# Patient Record
Sex: Female | Born: 1945
Health system: Southern US, Community
[De-identification: ages and names within clinical notes are randomized; demographics above are authoritative.]

## PROBLEM LIST (undated history)

## (undated) DIAGNOSIS — I509 Heart failure, unspecified: Secondary | ICD-10-CM

## (undated) DIAGNOSIS — I351 Nonrheumatic aortic (valve) insufficiency: Secondary | ICD-10-CM

## (undated) DIAGNOSIS — I340 Nonrheumatic mitral (valve) insufficiency: Secondary | ICD-10-CM

## (undated) DIAGNOSIS — I071 Rheumatic tricuspid insufficiency: Secondary | ICD-10-CM

## (undated) DIAGNOSIS — I480 Paroxysmal atrial fibrillation: Secondary | ICD-10-CM

## (undated) DIAGNOSIS — I4892 Unspecified atrial flutter: Secondary | ICD-10-CM

## (undated) HISTORY — PX: HIP FRACTURE SURGERY: SHX118

---

## 2008-12-14 DIAGNOSIS — M3500 Sicca syndrome, unspecified: Secondary | ICD-10-CM | POA: Insufficient documentation

## 2008-12-14 DIAGNOSIS — Z79899 Other long term (current) drug therapy: Secondary | ICD-10-CM | POA: Insufficient documentation

## 2013-05-15 DIAGNOSIS — E559 Vitamin D deficiency, unspecified: Secondary | ICD-10-CM | POA: Insufficient documentation

## 2014-05-19 DIAGNOSIS — I5043 Acute on chronic combined systolic (congestive) and diastolic (congestive) heart failure: Secondary | ICD-10-CM | POA: Insufficient documentation

## 2014-05-30 DIAGNOSIS — I340 Nonrheumatic mitral (valve) insufficiency: Secondary | ICD-10-CM | POA: Insufficient documentation

## 2015-10-28 DIAGNOSIS — M0579 Rheumatoid arthritis with rheumatoid factor of multiple sites without organ or systems involvement: Secondary | ICD-10-CM | POA: Insufficient documentation

## 2019-05-05 DIAGNOSIS — I7300 Raynaud's syndrome without gangrene: Secondary | ICD-10-CM | POA: Insufficient documentation

## 2020-07-10 DIAGNOSIS — M109 Gout, unspecified: Secondary | ICD-10-CM | POA: Insufficient documentation

## 2020-07-15 ENCOUNTER — Other Ambulatory Visit (HOSPITAL_COMMUNITY): Payer: Self-pay | Admitting: *Deleted

## 2020-07-15 ENCOUNTER — Encounter (HOSPITAL_COMMUNITY): Payer: Self-pay | Admitting: *Deleted

## 2020-07-15 DIAGNOSIS — K219 Gastro-esophageal reflux disease without esophagitis: Secondary | ICD-10-CM

## 2020-07-15 DIAGNOSIS — K529 Noninfective gastroenteritis and colitis, unspecified: Secondary | ICD-10-CM

## 2020-07-15 DIAGNOSIS — M069 Rheumatoid arthritis, unspecified: Secondary | ICD-10-CM

## 2020-07-15 DIAGNOSIS — M810 Age-related osteoporosis without current pathological fracture: Secondary | ICD-10-CM

## 2020-07-15 DIAGNOSIS — M1990 Unspecified osteoarthritis, unspecified site: Secondary | ICD-10-CM

## 2020-07-15 DIAGNOSIS — I10 Essential (primary) hypertension: Secondary | ICD-10-CM

## 2020-07-15 DIAGNOSIS — I5020 Unspecified systolic (congestive) heart failure: Secondary | ICD-10-CM

## 2020-07-15 DIAGNOSIS — I428 Other cardiomyopathies: Secondary | ICD-10-CM | POA: Insufficient documentation

## 2020-07-15 DIAGNOSIS — R002 Palpitations: Secondary | ICD-10-CM

## 2020-07-15 DIAGNOSIS — I420 Dilated cardiomyopathy: Secondary | ICD-10-CM

## 2020-07-15 HISTORY — DX: Unspecified osteoarthritis, unspecified site: M19.90

## 2020-07-15 HISTORY — DX: Gastro-esophageal reflux disease without esophagitis: K21.9

## 2020-07-15 HISTORY — DX: Dilated cardiomyopathy: I42.0

## 2020-07-15 HISTORY — DX: Age-related osteoporosis without current pathological fracture: M81.0

## 2020-07-15 HISTORY — DX: Rheumatoid arthritis, unspecified: M06.9

## 2020-07-15 HISTORY — DX: Unspecified systolic (congestive) heart failure: I50.20

## 2020-07-15 HISTORY — DX: Noninfective gastroenteritis and colitis, unspecified: K52.9

## 2020-07-15 HISTORY — DX: Essential (primary) hypertension: I10

## 2020-07-15 HISTORY — DX: Palpitations: R00.2

## 2020-08-08 ENCOUNTER — Ambulatory Visit (HOSPITAL_BASED_OUTPATIENT_CLINIC_OR_DEPARTMENT_OTHER)
Admission: RE | Admit: 2020-08-08 | Discharge: 2020-08-08 | Disposition: A | Discharge status: Home / Self Care | Payer: Medicare PPO | Source: Ambulatory Visit | Attending: Cardiology | Admitting: Cardiology

## 2020-08-08 ENCOUNTER — Other Ambulatory Visit (HOSPITAL_COMMUNITY): Payer: Self-pay

## 2020-08-08 ENCOUNTER — Other Ambulatory Visit: Payer: Self-pay

## 2020-08-08 ENCOUNTER — Encounter (HOSPITAL_COMMUNITY): Payer: Self-pay | Admitting: Cardiology

## 2020-08-08 ENCOUNTER — Ambulatory Visit (HOSPITAL_COMMUNITY)
Admission: RE | Admit: 2020-08-08 | Discharge: 2020-08-08 | Disposition: A | Discharge status: Home / Self Care | Payer: Medicare PPO | Source: Ambulatory Visit | Attending: Cardiology | Admitting: Cardiology

## 2020-08-08 VITALS — BP 90/50 | HR 82 | Wt 107.0 lb

## 2020-08-08 DIAGNOSIS — I5022 Chronic systolic (congestive) heart failure: Secondary | ICD-10-CM | POA: Diagnosis not present

## 2020-08-08 DIAGNOSIS — Z8249 Family history of ischemic heart disease and other diseases of the circulatory system: Secondary | ICD-10-CM | POA: Diagnosis not present

## 2020-08-08 DIAGNOSIS — I5020 Unspecified systolic (congestive) heart failure: Secondary | ICD-10-CM | POA: Insufficient documentation

## 2020-08-08 DIAGNOSIS — Z7982 Long term (current) use of aspirin: Secondary | ICD-10-CM | POA: Diagnosis not present

## 2020-08-08 DIAGNOSIS — Z7952 Long term (current) use of systemic steroids: Secondary | ICD-10-CM | POA: Insufficient documentation

## 2020-08-08 DIAGNOSIS — M069 Rheumatoid arthritis, unspecified: Secondary | ICD-10-CM | POA: Diagnosis not present

## 2020-08-08 DIAGNOSIS — N183 Chronic kidney disease, stage 3 unspecified: Secondary | ICD-10-CM | POA: Insufficient documentation

## 2020-08-08 DIAGNOSIS — I428 Other cardiomyopathies: Secondary | ICD-10-CM | POA: Diagnosis not present

## 2020-08-08 DIAGNOSIS — I130 Hypertensive heart and chronic kidney disease with heart failure and stage 1 through stage 4 chronic kidney disease, or unspecified chronic kidney disease: Secondary | ICD-10-CM | POA: Insufficient documentation

## 2020-08-08 HISTORY — DX: Heart failure, unspecified: I50.9

## 2020-08-08 LAB — BASIC METABOLIC PANEL
Anion gap: 15 (ref 5–15)
BUN: 40 mg/dL — ABNORMAL HIGH (ref 8–23)
CO2: 22 mmol/L (ref 22–32)
Calcium: 9.2 mg/dL (ref 8.9–10.3)
Chloride: 102 mmol/L (ref 98–111)
Creatinine, Ser: 1.69 mg/dL — ABNORMAL HIGH (ref 0.44–1.00)
GFR, Estimated: 31 mL/min — ABNORMAL LOW (ref >60)
Glucose, Bld: 89 mg/dL (ref 70–99)
Potassium: 3.7 mmol/L (ref 3.5–5.1)
Sodium: 139 mmol/L (ref 135–145)

## 2020-08-08 LAB — ECHOCARDIOGRAM COMPLETE
Area-P 1/2: 4.6 cm2
Calc EF: 15.8 %
MV M vel: 4.07 m/s
MV Peak grad: 66.3 mmHg
P 1/2 time: 356 msec
Radius: 0.3 cm
S' Lateral: 5.5 cm
Single Plane A2C EF: 20.9 %
Single Plane A4C EF: 5.2 %
Weight: 1712 oz

## 2020-08-08 LAB — CBC
HCT: 37.4 % (ref 36.0–46.0)
Hemoglobin: 11.9 g/dL — ABNORMAL LOW (ref 12.0–15.0)
MCH: 26.9 pg (ref 26.0–34.0)
MCHC: 31.8 g/dL (ref 30.0–36.0)
MCV: 84.4 fL (ref 80.0–100.0)
Platelets: 258 10*3/uL (ref 150–400)
RBC: 4.43 MIL/uL (ref 3.87–5.11)
RDW: 19 % — ABNORMAL HIGH (ref 11.5–15.5)
WBC: 5.8 10*3/uL (ref 4.0–10.5)
nRBC: 0 % (ref 0.0–0.2)

## 2020-08-08 LAB — MAGNESIUM: Magnesium: 2.4 mg/dL (ref 1.7–2.4)

## 2020-08-08 LAB — BRAIN NATRIURETIC PEPTIDE: B Natriuretic Peptide: 2904 pg/mL — ABNORMAL HIGH (ref 0.0–100.0)

## 2020-08-08 MED ORDER — CHOLESTYRAMINE 4 G PO PACK: 4.0000 g | PACK | Freq: Every day | ORAL | 12 refills | Status: DC

## 2020-08-08 MED ORDER — FOLIC ACID 1 MG PO TABS: 1.0000 mg | ORAL_TABLET | Freq: Every day | ORAL | 3 refills | Status: DC

## 2020-08-08 MED ORDER — RESTASIS 0.05 % OP EMUL: 1.0000 [drp] | Freq: Two times a day (BID) | OPHTHALMIC | 3 refills | Status: DC

## 2020-08-08 MED ORDER — AZATHIOPRINE 50 MG PO TABS: 50.0000 mg | ORAL_TABLET | Freq: Every day | ORAL | 3 refills | Status: DC

## 2020-08-08 MED ORDER — PREDNISONE 5 MG PO TABS: 5.0000 mg | ORAL_TABLET | Freq: Every day | ORAL | 3 refills | Status: DC

## 2020-08-08 MED ORDER — ERGOCALCIFEROL 1.25 MG (50000 UT) PO CAPS: 50000.0000 [IU] | ORAL_CAPSULE | ORAL | 3 refills | Status: DC

## 2020-08-08 MED ORDER — HYDROXYCHLOROQUINE SULFATE 200 MG PO TABS: 200.0000 mg | ORAL_TABLET | Freq: Every day | ORAL | 3 refills | Status: DC

## 2020-08-08 MED ORDER — BUMETANIDE 1 MG PO TABS: 3.0000 mg | ORAL_TABLET | Freq: Two times a day (BID) | ORAL | 3 refills | Status: DC

## 2020-08-08 MED ORDER — OMEPRAZOLE 20 MG PO CPDR: 20.0000 mg | DELAYED_RELEASE_CAPSULE | Freq: Every day | ORAL | 11 refills | Status: DC

## 2020-08-08 MED ORDER — METOPROLOL SUCCINATE ER 50 MG PO TB24: 75.0000 mg | ORAL_TABLET | Freq: Every day | ORAL | 3 refills | Status: DC

## 2020-08-08 MED ORDER — ASPIRIN EC 81 MG PO TBEC: 81.0000 mg | DELAYED_RELEASE_TABLET | Freq: Every day | ORAL | 3 refills | Status: DC

## 2020-08-08 NOTE — Progress Notes (Signed)
Patient ambulated in hall on room air oxygen saturation ranged from 100% to 86% heart rate ranged from 89-93.

## 2020-08-08 NOTE — Progress Notes (Signed)
ReDS Vest / Clip - 08/08/20 1100      ReDS Vest / Clip   Station Marker A    Ruler Value 23    ReDS Value Range Low volume    ReDS Actual Value 20

## 2020-08-08 NOTE — Patient Instructions (Addendum)
START Verquvo 2.5mg  (1 tablet) Daily  You have been referred to Dr. Melissa Noon office for Rheumatology. They will contact you to schedule an appointment  You have been referred to Nephrology. They will contact you to schedule an appointment  Labs done today, your results will be available in MyChart, we will contact you for abnormal readings.  Your physician has requested that you have an echocardiogram. Echocardiography is a painless test that uses sound waves to create images of your heart. It provides your doctor with information about the size and shape of your heart and how well your heart's chambers and valves are working. This procedure takes approximately one hour. There are no restrictions for this procedure. THIS WILL HAPPEN AFTER YOUR APPOINTMENT TODAY  Your physician recommends that you schedule a follow-up appointment in: 2 weeks   If you have any questions or concerns before your next appointment please send [place] a message through mychart or call our office at [phone].    TO LEAVE A MESSAGE FOR THE NURSE SELECT OPTION 2, PLEASE LEAVE A MESSAGE INCLUDING: . YOUR NAME . DATE OF BIRTH . CALL BACK NUMBER . REASON FOR CALL**this is important as we prioritize the call backs  [place] AS LONG AS YOU CALL BEFORE 4:00 PM    HEART CATHETERIZATION INSTRUCTIONS:   You are scheduled for a Cardiac Catheterization on Wednesday, January 5 with Dr. Loralie Champagne.  1. Please arrive at the [facility] (Main Entrance A) at [facility]: [address] [place], [place] [address] at 6:30 AM (This time is two hours before your procedure to ensure your preparation). Free valet parking service is available.   Special note: Every effort is made to have your procedure done on time. Please understand that emergencies sometimes delay scheduled procedures.  2. Diet: Do not eat solid foods after midnight.  The patient may have clear liquids until 5am upon the  day of the procedure.  3. COVID TEST: 1/3 at [place], [place] [address]  4. Medication instructions in preparation for your procedure:  On the morning of your procedure, take your Aspirin and any morning medicines.  You may use sips of water.  5. Plan for one night stay--bring personal belongings. 6. Bring a current list of your medications and current insurance cards. 7. You MUST have a responsible person to drive you home. 8. Someone MUST be with you the first 24 hours after you arrive home or your discharge will be delayed. 9. Please wear clothes that are easy to get on and off and wear slip-on shoes.  Thank you for allowing [place] to care for you!   -- [facility]

## 2020-08-08 NOTE — Progress Notes (Signed)
Cardiology: Dr. Aundra Dubin  74 y.o. with history of rheumatoid arthritis, CKD stage 3, and nonischemic cardiomyopathy was referred by Dr. Andrey Cota in [place] to establish heart failure care in [place].  Patient has been known to have a cardiomyopathy since 2015.  Cardiac MRI in 2015 showed EF 37% with no LGE.  Coronary angiography at that time showed no significant coronary disease.  Over the next few years, LV EF fluctuated up and down.  In 3/21, echo showed EF down to 20-25%.  She was admitted to the hospital in [place], [place] in 11/21 with CHF exacerbation.  Echo showed EF 20-25%, global hypokinesis, mildly decreased RV systolic function.  Creatinine was noted to be elevated. She was diuresed and sent home, but was readmitted later in 11/21 with ongoing volume overload. Creatinine was up to 2.89.  She was taken off most of her cardiac meds due to soft BP and elevated creatinine. She was discharged to her daughter's house in [place].   Patient presents today with her children.  She has poor appetite/early satiety.  She is fatigued/short of breath after walking about 30-40 feet though symptoms tend to wax and wane.  Oxygen saturation is fine at rest but dropped to 86% today with ambulation.  She has RA but reports minimal joint pain currently.  She is on prednisone 5 mg daily chronically.  She has chronic 3 pillow orthopnea.   Of note, it sounds like her mother also had a nonischemic cardiomyopathy.   ECG (personally reviewed): NSR, LAFB, poor RWP.   Labs (12/21): K 5, creatinine 2.89 => 1.9  PMH: 1. GERD 2. Rheumatoid arthritis: Followed by rheumatology in [place].  - No known pulmonary disease from RA.  3. Osteoporosis. 4. Gout 5. CKD stage 3 6. H/o SVT 7. Chronic systolic CHF: She was found to have nonischemic cardiomyopathy with low EF in 2015.  - LHC (2015): Normal coronaries.  - cardiac MRI (2015): EF 37%, no LGE - Echo (3/21): EF 20-25%. - Echo (11/21): EF 20-25%, moderate LV  dilation, global hypokinesis, biatrial enlargement, mildly decreased RV systolic function.   SH: From [place] but recently moved to [place] to live with daughter.  Has 3 children.  Nonsmoker.  No ETOH.   FH: Mother with CHF, father with MI.   ROS: All systems reviewed and negative except as per HPI.   Current Outpatient Medications  Medication Sig Dispense Refill  . aspirin EC 81 MG tablet Take 1 tablet (81 mg total) by mouth daily. Swallow whole. 90 tablet 3  . azaTHIOprine (IMURAN) 50 MG tablet Take 1 tablet (50 mg total) by mouth daily. 30 tablet 3  . bumetanide (BUMEX) 1 MG tablet Take 3 tablets (3 mg total) by mouth 2 (two) times daily. 90 tablet 3  . cholestyramine (QUESTRAN) 4 g packet Take 1 packet (4 g total) by mouth daily. 60 each 12  . cycloSPORINE (RESTASIS) 0.05 % ophthalmic emulsion Place 1 drop into both eyes 2 (two) times daily. 0.4 mL 3  . ergocalciferol (VITAMIN D2) 1.25 MG (50000 UT) capsule Take 1 capsule (50,000 Units total) by mouth once a week. 10 capsule 3  . folic acid (FOLVITE) 1 MG tablet Take 1 tablet (1 mg total) by mouth daily. 30 tablet 3  . hydroxychloroquine (PLAQUENIL) 200 MG tablet Take 1 tablet (200 mg total) by mouth daily. 30 tablet 3  . metoprolol succinate (TOPROL XL) 50 MG 24 hr tablet Take 1.5 tablets (75 mg total) by mouth daily. Take with or immediately  following a meal. 45 tablet 3  . omeprazole (PRILOSEC) 20 MG capsule Take 1 capsule (20 mg total) by mouth daily. 30 capsule 11  . predniSONE (DELTASONE) 5 MG tablet Take 1 tablet (5 mg total) by mouth daily with breakfast. 30 tablet 3   No current facility-administered medications for this encounter.   BP (!) 90/50   Pulse 82   Wt 48.5 kg (107 lb)   SpO2 99%  General: NAD, thin Neck: No JVD, no thyromegaly or thyroid nodule.  Lungs: Clear to auscultation bilaterally with normal respiratory effort. CV: Nondisplaced PMI.  Heart regular S1/S2, no S3/S4, 1/6 HSM LLSB/apex.  No peripheral  edema.  No carotid bruit.  Normal pedal pulses.  Abdomen: Soft, nontender, no hepatosplenomegaly, no distention.  Skin: Intact without lesions or rashes.  Neurologic: Alert and oriented x 3.  Psych: Normal affect. Extremities: No clubbing or cyanosis.  HEENT: Normal.   Assessment/Plan: 1. Chronic systolic CHF: Nonischemic cardiomyopathy.  This has been known since 2015, cath in 2015 showed no coronary disease and cardiac MRI in 2015 showed no LGE.  Cause is uncertain, familial cardiomyopathy is a concern given nonischemic cardiomyopathy in her mother.  Cannot rule out remote viral myocarditis. She seems quite limited, NYHA class IIIb.  Poor appetite and weight loss is concerning, as is elevated creatinine (suspect cardiorenal syndrome).  Low BP and elevated creatinine have limited her cardiac med regimen. I am worried about low output/advanced heart failure here.  I am concerned that she could be too far advanced for LVAD, especially with elevated creatinine.  She is not volume overloaded by exam or REDS clip.  - Continue Bumex 3 mg bid.  - Continue Toprol XL 75 mg daily.  - With low BP and soft BP today, not many options for medication titration.  Given recent hospitalizations, I think she would be a good candidate for Verquvo 2.5 mg daily.  Will start this today.  - I will arrange for echo here.  - I will try to get her oxygen for use with exertion.  - I mentioned [employer] gene testing for possible familial cardiomyopathy. She will think about this.  - Given class IIIb symptoms and concern for advanced HF, I will arrange for RHC.  If she has low output, will need to consider milrinone initiation in an attempt to improve her creatinine and to see if she would be an LVAD candidate.  2. CKD: Stage 3.   - BMET today.  - Refer to nephrology.  3. Rheumatoid arthritis: No history of lung involvement. She has been on a low dose of prednisone chronically.  - Refer to Dr. Amil Amen to establish care for  RA.   Loralie Champagne 08/08/2020

## 2020-08-08 NOTE — Progress Notes (Signed)
  Echocardiogram 2D Echocardiogram has been performed.  Tracy Nelson 08/08/2020, 12:38 PM

## 2020-08-08 NOTE — H&P (View-Only) (Signed)
Cardiology: Dr. Aundra Dubin  74 y.o. with history of rheumatoid arthritis, CKD stage 3, and nonischemic cardiomyopathy was referred by Dr. Andrey Cota in [place] to establish heart failure care in [place].  Patient has been known to have a cardiomyopathy since 2015.  Cardiac MRI in 2015 showed EF 37% with no LGE.  Coronary angiography at that time showed no significant coronary disease.  Over the next few years, LV EF fluctuated up and down.  In 3/21, echo showed EF down to 20-25%.  She was admitted to the hospital in [place], [place] in 11/21 with CHF exacerbation.  Echo showed EF 20-25%, global hypokinesis, mildly decreased RV systolic function.  Creatinine was noted to be elevated. She was diuresed and sent home, but was readmitted later in 11/21 with ongoing volume overload. Creatinine was up to 2.89.  She was taken off most of her cardiac meds due to soft BP and elevated creatinine. She was discharged to her daughter's house in [place].   Patient presents today with her children.  She has poor appetite/early satiety.  She is fatigued/short of breath after walking about 30-40 feet though symptoms tend to wax and wane.  Oxygen saturation is fine at rest but dropped to 86% today with ambulation.  She has RA but reports minimal joint pain currently.  She is on prednisone 5 mg daily chronically.  She has chronic 3 pillow orthopnea.   Of note, it sounds like her mother also had a nonischemic cardiomyopathy.   ECG (personally reviewed): NSR, LAFB, poor RWP.   Labs (12/21): K 5, creatinine 2.89 => 1.9  PMH: 1. GERD 2. Rheumatoid arthritis: Followed by rheumatology in [place].  - No known pulmonary disease from RA.  3. Osteoporosis. 4. Gout 5. CKD stage 3 6. H/o SVT 7. Chronic systolic CHF: She was found to have nonischemic cardiomyopathy with low EF in 2015.  - LHC (2015): Normal coronaries.  - cardiac MRI (2015): EF 37%, no LGE - Echo (3/21): EF 20-25%. - Echo (11/21): EF 20-25%, moderate LV  dilation, global hypokinesis, biatrial enlargement, mildly decreased RV systolic function.   SH: From [place] but recently moved to [place] to live with daughter.  Has 3 children.  Nonsmoker.  No ETOH.   FH: Mother with CHF, father with MI.   ROS: All systems reviewed and negative except as per HPI.   Current Outpatient Medications  Medication Sig Dispense Refill  . aspirin EC 81 MG tablet Take 1 tablet (81 mg total) by mouth daily. Swallow whole. 90 tablet 3  . azaTHIOprine (IMURAN) 50 MG tablet Take 1 tablet (50 mg total) by mouth daily. 30 tablet 3  . bumetanide (BUMEX) 1 MG tablet Take 3 tablets (3 mg total) by mouth 2 (two) times daily. 90 tablet 3  . cholestyramine (QUESTRAN) 4 g packet Take 1 packet (4 g total) by mouth daily. 60 each 12  . cycloSPORINE (RESTASIS) 0.05 % ophthalmic emulsion Place 1 drop into both eyes 2 (two) times daily. 0.4 mL 3  . ergocalciferol (VITAMIN D2) 1.25 MG (50000 UT) capsule Take 1 capsule (50,000 Units total) by mouth once a week. 10 capsule 3  . folic acid (FOLVITE) 1 MG tablet Take 1 tablet (1 mg total) by mouth daily. 30 tablet 3  . hydroxychloroquine (PLAQUENIL) 200 MG tablet Take 1 tablet (200 mg total) by mouth daily. 30 tablet 3  . metoprolol succinate (TOPROL XL) 50 MG 24 hr tablet Take 1.5 tablets (75 mg total) by mouth daily. Take with or immediately  following a meal. 45 tablet 3  . omeprazole (PRILOSEC) 20 MG capsule Take 1 capsule (20 mg total) by mouth daily. 30 capsule 11  . predniSONE (DELTASONE) 5 MG tablet Take 1 tablet (5 mg total) by mouth daily with breakfast. 30 tablet 3   No current facility-administered medications for this encounter.   BP (!) 90/50   Pulse 82   Wt 48.5 kg (107 lb)   SpO2 99%  General: NAD, thin Neck: No JVD, no thyromegaly or thyroid nodule.  Lungs: Clear to auscultation bilaterally with normal respiratory effort. CV: Nondisplaced PMI.  Heart regular S1/S2, no S3/S4, 1/6 HSM LLSB/apex.  No peripheral  edema.  No carotid bruit.  Normal pedal pulses.  Abdomen: Soft, nontender, no hepatosplenomegaly, no distention.  Skin: Intact without lesions or rashes.  Neurologic: Alert and oriented x 3.  Psych: Normal affect. Extremities: No clubbing or cyanosis.  HEENT: Normal.   Assessment/Plan: 1. Chronic systolic CHF: Nonischemic cardiomyopathy.  This has been known since 2015, cath in 2015 showed no coronary disease and cardiac MRI in 2015 showed no LGE.  Cause is uncertain, familial cardiomyopathy is a concern given nonischemic cardiomyopathy in her mother.  Cannot rule out remote viral myocarditis. She seems quite limited, NYHA class IIIb.  Poor appetite and weight loss is concerning, as is elevated creatinine (suspect cardiorenal syndrome).  Low BP and elevated creatinine have limited her cardiac med regimen. I am worried about low output/advanced heart failure here.  I am concerned that she could be too far advanced for LVAD, especially with elevated creatinine.  She is not volume overloaded by exam or REDS clip.  - Continue Bumex 3 mg bid.  - Continue Toprol XL 75 mg daily.  - With low BP and soft BP today, not many options for medication titration.  Given recent hospitalizations, I think she would be a good candidate for Verquvo 2.5 mg daily.  Will start this today.  - I will arrange for echo here.  - I will try to get her oxygen for use with exertion.  - I mentioned [employer] gene testing for possible familial cardiomyopathy. She will think about this.  - Given class IIIb symptoms and concern for advanced HF, I will arrange for RHC.  If she has low output, will need to consider milrinone initiation in an attempt to improve her creatinine and to see if she would be an LVAD candidate.  2. CKD: Stage 3.   - BMET today.  - Refer to nephrology.  3. Rheumatoid arthritis: No history of lung involvement. She has been on a low dose of prednisone chronically.  - Refer to Dr. Amil Amen to establish care for  RA.   Loralie Champagne 08/08/2020

## 2020-08-12 ENCOUNTER — Other Ambulatory Visit (HOSPITAL_COMMUNITY)
Admission: RE | Admit: 2020-08-12 | Discharge: 2020-08-12 | Disposition: A | Discharge status: Home / Self Care | Payer: Medicare PPO | Source: Ambulatory Visit | Attending: Cardiology | Admitting: Cardiology

## 2020-08-12 ENCOUNTER — Other Ambulatory Visit (HOSPITAL_COMMUNITY): Payer: Self-pay | Admitting: *Deleted

## 2020-08-12 DIAGNOSIS — Z20822 Contact with and (suspected) exposure to covid-19: Secondary | ICD-10-CM | POA: Insufficient documentation

## 2020-08-12 DIAGNOSIS — Z01812 Encounter for preprocedural laboratory examination: Secondary | ICD-10-CM | POA: Diagnosis present

## 2020-08-12 LAB — SARS CORONAVIRUS 2 (TAT 6-24 HRS): SARS Coronavirus 2: NEGATIVE

## 2020-08-12 MED ORDER — VERQUVO 2.5 MG PO TABS: 2.5000 mg | ORAL_TABLET | Freq: Every day | ORAL | 3 refills | Status: DC

## 2020-08-13 ENCOUNTER — Telehealth (HOSPITAL_COMMUNITY): Payer: Self-pay | Admitting: Pharmacy Technician

## 2020-08-13 NOTE — Telephone Encounter (Signed)
Patient Advocate Encounter   Received notification from [facility] that prior authorization for Tracy Nelson is required.   PA submitted on CoverMyMeds Key [id number] Status is pending   Will continue to follow.

## 2020-08-14 ENCOUNTER — Encounter (HOSPITAL_COMMUNITY)
Admission: RE | Disposition: A | Discharge status: Home / Self Care | Payer: Medicare PPO | Source: Home / Self Care | Attending: Cardiology

## 2020-08-14 ENCOUNTER — Ambulatory Visit (HOSPITAL_COMMUNITY)
Admission: RE | Admit: 2020-08-14 | Discharge: 2020-08-14 | Disposition: A | Discharge status: Home / Self Care | Payer: Medicare PPO | Attending: Cardiology | Admitting: Cardiology

## 2020-08-14 ENCOUNTER — Telehealth (HOSPITAL_COMMUNITY): Payer: Self-pay

## 2020-08-14 ENCOUNTER — Encounter (HOSPITAL_COMMUNITY): Payer: Self-pay | Admitting: Cardiology

## 2020-08-14 DIAGNOSIS — Z79899 Other long term (current) drug therapy: Secondary | ICD-10-CM | POA: Diagnosis not present

## 2020-08-14 DIAGNOSIS — Z7982 Long term (current) use of aspirin: Secondary | ICD-10-CM | POA: Diagnosis not present

## 2020-08-14 DIAGNOSIS — Z8249 Family history of ischemic heart disease and other diseases of the circulatory system: Secondary | ICD-10-CM | POA: Diagnosis not present

## 2020-08-14 DIAGNOSIS — I428 Other cardiomyopathies: Secondary | ICD-10-CM | POA: Insufficient documentation

## 2020-08-14 DIAGNOSIS — M069 Rheumatoid arthritis, unspecified: Secondary | ICD-10-CM | POA: Diagnosis not present

## 2020-08-14 DIAGNOSIS — I5022 Chronic systolic (congestive) heart failure: Secondary | ICD-10-CM | POA: Insufficient documentation

## 2020-08-14 DIAGNOSIS — Z7952 Long term (current) use of systemic steroids: Secondary | ICD-10-CM | POA: Diagnosis not present

## 2020-08-14 DIAGNOSIS — I5020 Unspecified systolic (congestive) heart failure: Secondary | ICD-10-CM

## 2020-08-14 DIAGNOSIS — N183 Chronic kidney disease, stage 3 unspecified: Secondary | ICD-10-CM | POA: Insufficient documentation

## 2020-08-14 DIAGNOSIS — I2721 Secondary pulmonary arterial hypertension: Secondary | ICD-10-CM | POA: Insufficient documentation

## 2020-08-14 HISTORY — PX: RIGHT HEART CATH: CATH118263

## 2020-08-14 LAB — POCT I-STAT EG7
Acid-Base Excess: 0 mmol/L (ref 0.0–2.0)
Acid-Base Excess: 0 mmol/L (ref 0.0–2.0)
Bicarbonate: 25.4 mmol/L (ref 20.0–28.0)
Bicarbonate: 25.5 mmol/L (ref 20.0–28.0)
Calcium, Ion: 1.28 mmol/L (ref 1.15–1.40)
Calcium, Ion: 1.28 mmol/L (ref 1.15–1.40)
HCT: 33 % — ABNORMAL LOW (ref 36.0–46.0)
HCT: 33 % — ABNORMAL LOW (ref 36.0–46.0)
Hemoglobin: 11.2 g/dL — ABNORMAL LOW (ref 12.0–15.0)
Hemoglobin: 11.2 g/dL — ABNORMAL LOW (ref 12.0–15.0)
O2 Saturation: 61 %
O2 Saturation: 62 %
Potassium: 3.3 mmol/L — ABNORMAL LOW (ref 3.5–5.1)
Potassium: 3.3 mmol/L — ABNORMAL LOW (ref 3.5–5.1)
Sodium: 138 mmol/L (ref 135–145)
Sodium: 139 mmol/L (ref 135–145)
TCO2: 27 mmol/L (ref 22–32)
TCO2: 27 mmol/L (ref 22–32)
pCO2, Ven: 43.7 mmHg — ABNORMAL LOW (ref 44.0–60.0)
pCO2, Ven: 44.2 mmHg (ref 44.0–60.0)
pH, Ven: 7.369 (ref 7.250–7.430)
pH, Ven: 7.374 (ref 7.250–7.430)
pO2, Ven: 33 mmHg (ref 32.0–45.0)
pO2, Ven: 33 mmHg (ref 32.0–45.0)

## 2020-08-14 SURGERY — RIGHT HEART CATH
Anesthesia: LOCAL

## 2020-08-14 MED ORDER — ASPIRIN 81 MG PO CHEW: 81.0000 mg | CHEWABLE_TABLET | ORAL | Status: DC

## 2020-08-14 MED ORDER — HEPARIN (PORCINE) IN NACL 1000-0.9 UT/500ML-% IV SOLN
INTRAVENOUS | Status: AC
  Filled 2020-08-14: qty 500

## 2020-08-14 MED ORDER — LIDOCAINE HCL (PF) 1 % IJ SOLN
INTRAMUSCULAR | Status: AC
  Filled 2020-08-14: qty 30

## 2020-08-14 MED ORDER — SODIUM CHLORIDE 0.9 % IV SOLN: INTRAVENOUS | Status: DC

## 2020-08-14 MED ORDER — SODIUM CHLORIDE 0.9 % IV SOLN: 250.0000 mL | INTRAVENOUS | Status: DC | PRN

## 2020-08-14 MED ORDER — HEPARIN (PORCINE) IN NACL 1000-0.9 UT/500ML-% IV SOLN
INTRAVENOUS | Status: DC | PRN
  Administered 2020-08-14: 500 mL

## 2020-08-14 MED ORDER — LABETALOL HCL 5 MG/ML IV SOLN: 10.0000 mg | INTRAVENOUS | Status: DC | PRN

## 2020-08-14 MED ORDER — LIDOCAINE HCL (PF) 1 % IJ SOLN
INTRAMUSCULAR | Status: DC | PRN
  Administered 2020-08-14: 2 mL

## 2020-08-14 MED ORDER — ONDANSETRON HCL 4 MG/2ML IJ SOLN: 4.0000 mg | Freq: Four times a day (QID) | INTRAMUSCULAR | Status: DC | PRN

## 2020-08-14 MED ORDER — SODIUM CHLORIDE 0.9% FLUSH: 3.0000 mL | Freq: Two times a day (BID) | INTRAVENOUS | Status: DC

## 2020-08-14 MED ORDER — ASPIRIN 81 MG PO CHEW
81.0000 mg | CHEWABLE_TABLET | ORAL | Status: AC
  Administered 2020-08-14: 81 mg via ORAL
  Filled 2020-08-14: qty 1

## 2020-08-14 MED ORDER — HYDRALAZINE HCL 20 MG/ML IJ SOLN: 10.0000 mg | INTRAMUSCULAR | Status: DC | PRN

## 2020-08-14 MED ORDER — SODIUM CHLORIDE 0.9% FLUSH: 3.0000 mL | INTRAVENOUS | Status: DC | PRN

## 2020-08-14 MED ORDER — SODIUM CHLORIDE 0.9 % IV SOLN
INTRAVENOUS | Status: AC | PRN
  Administered 2020-08-14: 10 mL/h via INTRAVENOUS

## 2020-08-14 MED ORDER — ACETAMINOPHEN 325 MG PO TABS: 650.0000 mg | ORAL_TABLET | ORAL | Status: DC | PRN

## 2020-08-14 SURGICAL SUPPLY — 8 items
CATH BALLN WEDGE 5F 110CM (CATHETERS) ×2 IMPLANT
PACK CARDIAC CATHETERIZATION (CUSTOM PROCEDURE TRAY) ×2 IMPLANT
PROTECTION STATION PRESSURIZED (MISCELLANEOUS) ×2
SHEATH GLIDE SLENDER 4/5FR (SHEATH) ×2 IMPLANT
STATION PROTECTION PRESSURIZED (MISCELLANEOUS) ×1 IMPLANT
TRANSDUCER W/STOPCOCK (MISCELLANEOUS) ×2 IMPLANT
TUBING ART PRESS 72  MALE/FEM (TUBING) ×1
TUBING ART PRESS 72 MALE/FEM (TUBING) ×1 IMPLANT

## 2020-08-14 NOTE — Telephone Encounter (Signed)
Pt in for RHC today and advised she has not been able to get the verquvo, advised PA has been submitted and we are are awaiting approval, 30 day free card provided to pt.

## 2020-08-14 NOTE — Discharge Instructions (Signed)
Venogram A venogram, or venography, is a procedure that uses an X-ray and dye (contrast) to examine how well the veins work and how blood flows through them. Contrast helps the veins show up on X-rays. A venogram may be done:  To evaluate abnormalities in the vein.  To identify clots within veins, such as deep vein thrombosis (DVT).  To map out the veins that might be needed for another procedure. Tell a health care provider about:  Any allergies you have, especially to medicines, shellfish, iodine, and contrast.  All medicines you are taking, including vitamins, herbs, eye drops, creams, and over-the-counter medicines.  Any problems you or family members have had with anesthetic medicines.  Any blood disorders you have.  Any surgeries you have had and any complications that occurred.  Any medical conditions you have.  Whether you are pregnant, may be pregnant, or are breastfeeding.  Any history of smoking or tobacco use. What are the risks? Generally, this is a safe procedure. However, problems may occur, including:  Infection.  Bleeding.  Blood clots.  Allergic reaction to medicines or contrast.  Damage to other structures or organs.  Kidney problems.  Increased risk of cancer. Being exposed to too much radiation over a lifetime can increase the risk of cancer. The risk is small. What happens before the procedure? Medicines Ask your health care provider about:  Changing or stopping your regular medicines. This is especially important if you are taking diabetes medicines or blood thinners.  Taking medicines such as aspirin and ibuprofen. These medicines can thin your blood. Do not take these medicines unless your health care provider tells you to take them.  Taking over-the-counter medicines, vitamins, herbs, and supplements. General instructions  Follow instructions from your health care provider about eating or drinking restrictions.  You may have blood tests  to check how well your kidneys and liver are working and how well your blood can clot.  Plan to have someone take you home from the hospital or clinic. What happens during the procedure?   An IV will be inserted into one of your veins.  You may be given a medicine to help you relax (sedative).  You will lie down on an X-ray table. The table may be tilted in different directions during the procedure to help the contrast move throughout your body. Safety straps will keep you secure if the table is tilted.  If veins in your arm or leg will be examined, a band may be wrapped around that arm or leg to keep the veins full of blood. This may cause your arm or leg to feel numb.  The contrast will be injected into your IV. You may have a hot, flushed feeling as it moves throughout your body. You may also have a metallic taste in your mouth. Both of these sensations will go away after the test is complete.  You may be asked to lie in different positions or place your legs or arms in different positions.  At the end of the procedure, you may be given IV fluids to help wash or flush the contrast out of your veins.  The IV will be removed, and pressure will be applied to the IV site to prevent bleeding. A bandage (dressing) may be applied to the IV site. The exact procedure may vary among health care providers and hospitals. What can I expect after the procedure?  Your blood pressure, heart rate, breathing rate, and blood oxygen level will be monitored until you   leave the hospital or clinic.  You may be given something to eat and drink.  You may have bruising or mild discomfort in the area where the IV was inserted. Follow these instructions at home: Eating and drinking   Follow instructions from your health care provider about eating or drinking restrictions.  Drink a lot of water for the first several days after the procedure, as directed by your health care provider. This helps to flush the  contrast out of your body. Activity  Rest as told by your health care provider.  Return to your normal activities as told by your health care provider. Ask your health care provider what activities are safe for you.  If you were given a sedative during your procedure, do not drive for 24 hours or until your health care provider approves. General instructions  Check your IV insertion area every day for signs of infection. Check for: ? Redness, swelling, or pain. ? Fluid or blood. ? Warmth. ? Pus or a bad smell.  Take over-the-counter and prescription medicines only as told by your health care provider.  Keep all follow-up visits as told by your health care provider. This is important. Contact a health care provider if:  Your skin becomes itchy or you develop a rash or hives.  You have a fever that does not get better with medicine.  You feel nauseous or you vomit.  You have redness, swelling, or pain around the insertion site.  You have fluid or blood coming from the insertion site.  Your insertion area feels warm to the touch.  You have pus or a bad smell coming from the insertion site. Get help right away if you:  Have shortness of breath or difficulty breathing.  Develop chest pain.  Faint.  Feel very dizzy. These symptoms may represent a serious problem that is an emergency. Do not wait to see if the symptoms will go away. Get medical help right away. Call your local emergency services (911 in the [place] Do not drive yourself to the hospital. Summary  A venogram, or venography, is a procedure that uses an X-ray and contrast dye to check how well the veins work and how blood flows through them.  An IV will be inserted into one of your veins in order to inject the contrast.  During the exam, you will lie on an X-ray table. The table may be tilted in different directions during the procedure to help the contrast move throughout your body. Safety straps will keep you  secure.  After the procedure, you will need to drink a lot of water to help wash or flush the contrast out of your body. This information is not intended to replace advice given to you by your health care provider. Make sure you discuss any questions you have with your health care provider. Document Revised: 03/04/2019 Document Reviewed: 03/04/2019 Elsevier Patient Education  2020 Elsevier Inc.  

## 2020-08-14 NOTE — Interval H&P Note (Signed)
History and Physical Interval Note:  08/14/2020 8:37 AM  Tracy Nelson  has presented today for surgery, with the diagnosis of heart failure.  The various methods of treatment have been discussed with the patient and family. After consideration of risks, benefits and other options for treatment, the patient has consented to  Procedure(s): RIGHT HEART CATH (N/A) as a surgical intervention.  The patient's history has been reviewed, patient examined, no change in status, stable for surgery.  I have reviewed the patient's chart and labs.  Questions were answered to the patient's satisfaction.     Jerman Tinnon [employer]

## 2020-08-14 NOTE — Telephone Encounter (Signed)
Patient aware.

## 2020-08-14 NOTE — Telephone Encounter (Signed)
-----   Message from [name], MD sent at 08/08/2020 10:46 PM EST ----- Relatively normal RV.  LV EF < 20%.  Advanced heart failure.

## 2020-08-15 NOTE — Telephone Encounter (Signed)
Advanced Heart Failure Patient Advocate Encounter  Prior Authorization for Verquvo 2.5mg  has been approved.    PA# [id number] Effective dates: 08/10/20 through 08/09/21  Patients co-pay is $64 (30 days), $128 (90 days)  Called and spoke with patient. We are going to go ahead and start an application for [facility] assistance.

## 2020-08-20 ENCOUNTER — Encounter (HOSPITAL_COMMUNITY): Payer: Self-pay | Admitting: Cardiology

## 2020-08-20 ENCOUNTER — Ambulatory Visit (HOSPITAL_COMMUNITY)
Admission: RE | Admit: 2020-08-20 | Discharge: 2020-08-20 | Disposition: A | Discharge status: Home / Self Care | Payer: Medicare PPO | Source: Ambulatory Visit | Attending: Cardiology | Admitting: Cardiology

## 2020-08-20 ENCOUNTER — Other Ambulatory Visit: Payer: Self-pay

## 2020-08-20 VITALS — BP 102/60 | HR 79 | Wt 107.2 lb

## 2020-08-20 DIAGNOSIS — Z8249 Family history of ischemic heart disease and other diseases of the circulatory system: Secondary | ICD-10-CM | POA: Diagnosis not present

## 2020-08-20 DIAGNOSIS — I5020 Unspecified systolic (congestive) heart failure: Secondary | ICD-10-CM | POA: Diagnosis not present

## 2020-08-20 DIAGNOSIS — Z79899 Other long term (current) drug therapy: Secondary | ICD-10-CM | POA: Insufficient documentation

## 2020-08-20 DIAGNOSIS — Z7982 Long term (current) use of aspirin: Secondary | ICD-10-CM | POA: Diagnosis not present

## 2020-08-20 DIAGNOSIS — N183 Chronic kidney disease, stage 3 unspecified: Secondary | ICD-10-CM | POA: Insufficient documentation

## 2020-08-20 DIAGNOSIS — I420 Dilated cardiomyopathy: Secondary | ICD-10-CM | POA: Diagnosis not present

## 2020-08-20 DIAGNOSIS — Z7952 Long term (current) use of systemic steroids: Secondary | ICD-10-CM | POA: Diagnosis not present

## 2020-08-20 DIAGNOSIS — I5022 Chronic systolic (congestive) heart failure: Secondary | ICD-10-CM | POA: Diagnosis not present

## 2020-08-20 DIAGNOSIS — I428 Other cardiomyopathies: Secondary | ICD-10-CM | POA: Diagnosis not present

## 2020-08-20 DIAGNOSIS — M069 Rheumatoid arthritis, unspecified: Secondary | ICD-10-CM | POA: Diagnosis not present

## 2020-08-20 LAB — BASIC METABOLIC PANEL
Anion gap: 15 (ref 5–15)
BUN: 64 mg/dL — ABNORMAL HIGH (ref 8–23)
CO2: 23 mmol/L (ref 22–32)
Calcium: 9.4 mg/dL (ref 8.9–10.3)
Chloride: 99 mmol/L (ref 98–111)
Creatinine, Ser: 1.67 mg/dL — ABNORMAL HIGH (ref 0.44–1.00)
GFR, Estimated: 32 mL/min — ABNORMAL LOW (ref >60)
Glucose, Bld: 104 mg/dL — ABNORMAL HIGH (ref 70–99)
Potassium: 4.1 mmol/L (ref 3.5–5.1)
Sodium: 137 mmol/L (ref 135–145)

## 2020-08-20 MED ORDER — SPIRONOLACTONE 25 MG PO TABS: 12.5000 mg | ORAL_TABLET | Freq: Every day | ORAL | 3 refills | Status: DC

## 2020-08-20 MED ORDER — VERQUVO 5 MG PO TABS: 5.0000 mg | ORAL_TABLET | Freq: Every day | ORAL | 11 refills | Status: DC

## 2020-08-20 MED ORDER — METOPROLOL SUCCINATE ER 50 MG PO TB24: 50.0000 mg | ORAL_TABLET | Freq: Every day | ORAL | 3 refills | Status: DC

## 2020-08-20 NOTE — Progress Notes (Signed)
Cardiology: Dr. Aundra Dubin  75 y.o. with history of rheumatoid arthritis, CKD stage 3, and nonischemic cardiomyopathy was referred by Dr. Andrey Cota in [place] to establish heart failure care in [place].  Patient has been known to have a cardiomyopathy since 2015.  Cardiac MRI in 2015 showed EF 37% with no LGE.  Coronary angiography at that time showed no significant coronary disease.  Over the next few years, LV EF fluctuated up and down.  In 3/21, echo showed EF down to 20-25%.  She was admitted to the hospital in [place], [place] in 11/21 with CHF exacerbation.  Echo showed EF 20-25%, global hypokinesis, mildly decreased RV systolic function.  Creatinine was noted to be elevated. She was diuresed and sent home, but was readmitted later in 11/21 with ongoing volume overload. Creatinine was up to 2.89.  She was taken off most of her cardiac meds due to soft BP and elevated creatinine. She was discharged to her daughter's house in [place].   I repeated an echo on her in 12/21, EF < 20% with moderate LV dilation and mildly decreased RV systolic function. RHC was done, surprisingly showing low filling pressures and relatively preserved cardiac index at 2.22.   Patient presents today with her children.  She continues to have poor appetite/early satiety and complains of a band of pain across her upper abdomen.  She remains fatigued/short of breath after walking about 30-40 feet though symptoms tend to wax and wane.  Main complaint really continues to be fatigue.  No lightheadedness. She has RA but reports minimal joint pain currently.  She is on prednisone 5 mg daily chronically.  She has chronic 3 pillow orthopnea.    Labs (12/21): K 5 => 3.7, creatinine 2.89 => 1.9 => 1.69, BNP 2904  PMH: 1. GERD 2. Rheumatoid arthritis: Followed by rheumatology in [place].  - No known pulmonary disease from RA.  3. Osteoporosis. 4. Gout 5. CKD stage 3 6. H/o SVT 7. Chronic systolic CHF: She was found to have  nonischemic cardiomyopathy with low EF in 2015.  - LHC (2015): Normal coronaries.  - cardiac MRI (2015): EF 37%, no LGE - Echo (3/21): EF 20-25%. - Echo (11/21): EF 20-25%, moderate LV dilation, global hypokinesis, biatrial enlargement, mildly decreased RV systolic function.  - Echo (12/21): EF < 20%, moderate LV dilation, normal RV size/systolic function, moderate MR, IVC normal.  - RHC (1/22): Mean RA 1, PA 39/14, mean PCWP 10, PVR 3.9 WU, CI 2.22.   SH: From [place] but recently moved to [place] to live with daughter.  Has 3 children.  Nonsmoker.  No ETOH.   FH: Mother with CHF, father with MI.   ROS: All systems reviewed and negative except as per HPI.   Current Outpatient Medications  Medication Sig Dispense Refill  . acetaminophen (TYLENOL) 500 MG tablet Take 500-1,000 mg by mouth every 6 (six) hours as needed for moderate pain.    Marland Kitchen aspirin EC 81 MG tablet Take 1 tablet (81 mg total) by mouth daily. Swallow whole. 90 tablet 3  . azaTHIOprine (IMURAN) 50 MG tablet Take 1 tablet (50 mg total) by mouth daily. 30 tablet 3  . bumetanide (BUMEX) 1 MG tablet Take 3 tablets (3 mg total) by mouth 2 (two) times daily. 90 tablet 3  . cholestyramine (QUESTRAN) 4 g packet Take 1 packet (4 g total) by mouth daily. 60 each 12  . cycloSPORINE (RESTASIS) 0.05 % ophthalmic emulsion Place 1 drop into both eyes 2 (two) times daily. 0.4  mL 3  . Ensure (ENSURE) Take 237 mLs by mouth daily.    . ergocalciferol (VITAMIN D2) 1.25 MG (50000 UT) capsule Take 1 capsule (50,000 Units total) by mouth once a week. (Patient taking differently: Take 50,000 Units by mouth every Monday.) 10 capsule 3  . folic acid (FOLVITE) 1 MG tablet Take 1 tablet (1 mg total) by mouth daily. 30 tablet 3  . hydroxychloroquine (PLAQUENIL) 200 MG tablet Take 1 tablet (200 mg total) by mouth daily. 30 tablet 3  . ibandronate (BONIVA) 150 MG tablet Take 150 mg by mouth every 30 (thirty) days. Take in the morning with a full glass  of water, on an empty stomach, and do not take anything else by mouth or lie down for the next 30 min.    . Menthol, Topical Analgesic, (ICY HOT BACK EX) Apply 1 application topically daily as needed (pain).    Marland Kitchen MITIGARE 0.6 MG CAPS Take 0.6 mg by mouth daily as needed (gout).    . Multiple Vitamin (MULTIVITAMIN) tablet Take 1 tablet by mouth daily.    Marland Kitchen omeprazole (PRILOSEC) 40 MG capsule Take 40 mg by mouth daily.    . predniSONE (DELTASONE) 5 MG tablet Take 1 tablet (5 mg total) by mouth daily with breakfast. 30 tablet 3  . spironolactone (ALDACTONE) 25 MG tablet Take 0.5 tablets (12.5 mg total) by mouth daily. 45 tablet 3  . Vericiguat (VERQUVO) 5 MG TABS Take 1 tablet (5 mg total) by mouth daily. 30 tablet 11  . metoprolol succinate (TOPROL XL) 50 MG 24 hr tablet Take 1 tablet (50 mg total) by mouth daily. Take with or immediately following a meal. 90 tablet 3   No current facility-administered medications for this encounter.   BP 102/60   Pulse 79   Wt 48.6 kg (107 lb 3.2 oz)   SpO2 99%   BMI 18.40 kg/m  General: NAD, thin Neck: No JVD, no thyromegaly or thyroid nodule.  Lungs: Clear to auscultation bilaterally with normal respiratory effort. CV: Nondisplaced PMI.  Heart regular S1/S2, no S3/S4, no murmur.  No peripheral edema.  No carotid bruit.  Normal pedal pulses.  Abdomen: Soft, nontender, no hepatosplenomegaly, no distention.  Skin: Intact without lesions or rashes.  Neurologic: Alert and oriented x 3.  Psych: Normal affect. Extremities: No clubbing or cyanosis.  HEENT: Normal.   Assessment/Plan: 1. Chronic systolic CHF: Nonischemic cardiomyopathy.  This has been known since 2015, cath in 2015 showed no coronary disease and cardiac MRI in 2015 showed no LGE.  Cause is uncertain, familial cardiomyopathy is a concern given nonischemic cardiomyopathy in her mother.  Cannot rule out remote viral myocarditis. She seems quite limited, primarily by fatigue, NYHA class IIIb.  Poor  appetite and weight loss is concerning, as is elevated creatinine (suspect cardiorenal syndrome).  Low BP and elevated creatinine have limited her cardiac med regimen. Most recent echo in 12/21 showed EF < 20% with normal RV systolic function. RHC showed low filling pressures and actually a relatively preserved cardiac index of 2.22.  She is not volume overloaded on exam today.  - Continue Bumex 3 mg bid. BMET today.  - With prominent fatigue, will decrease Toprol XL to 50 mg daily.  - Increase Verquvo to 5 mg daily.  - Start spironolactone 12.5 mg daily and repeat BMET in 10 days.  - I mentioned [employer] gene testing for possible familial cardiomyopathy. She will think about this.  - Though RHC was not markedly abnormal, I  am worried that she is nearing the time for consideration of advanced therapies.  I will arrange for a CPX to assess her functional capacity and for prognostic purposes.  2. CKD: Stage 3.   - BMET today.  - Will make sure she has referral to nephrology.  3. Rheumatoid arthritis: No history of lung involvement. She has been on a low dose of prednisone chronically.  - Will make sure she has referral to Dr. Amil Amen to establish care for RA.  4. GI discomfort: Band of pain across upper abdomen, worse after meals.  Early satiety.  It is possible that symptoms may be due to heart failure itself, but she would like GI evaluation which I think is reasonable.  - I will refer to GI.   Loralie Champagne 08/20/2020

## 2020-08-20 NOTE — Patient Instructions (Addendum)
Labs done today. We will contact you only if your labs are abnormal.  START Spironolactone 12.5mg  (1/2 tablet) by mouth daily.  INCREASE Verquvo 5mg  (1 tablet) by mouth daily.  DECREASE Metoprolol XL 50mg  (1 tablet) by mouth daily.  No other medication changes were made. Please continue all other medications as prescribed.  You have been referred to Cardiac Rehab, Neurology, Rheumatology. These offices will contact you to scheduled an appointment. Please allow them about 2 weeks to contact you. If you have not heard anything within 2 weeks please contact our office.  Your physician recommends that you schedule a follow-up appointment in: 1 week for a lab only appointment,2 weeks for an appointment with our Clinic Pharmacist, Lauren and in 1 month for an appointment with Dr. Aundra Dubin.  If you have any questions or concerns before your next appointment please send [place] a message through [place] or call our office at [phone].    TO LEAVE A MESSAGE FOR THE NURSE SELECT OPTION 2, PLEASE LEAVE A MESSAGE INCLUDING: . YOUR NAME . DATE OF BIRTH . CALL BACK NUMBER . REASON FOR CALL**this is important as we prioritize the call backs  [place] AS LONG AS YOU CALL BEFORE 4:00 PM   Your physician has recommended that you have a cardiopulmonary stress test (CPX). CPX testing is a non-invasive measurement of heart and lung function. It replaces a traditional treadmill stress test. This type of test provides a tremendous amount of information that relates not only to your present condition but also for future outcomes. This test combines measurements of you ventilation, respiratory gas exchange in the lungs, electrocardiogram (EKG), blood pressure and physical response before, during, and following an exercise protocol. Someone will contact you to schedule an appointment.    Do the following things EVERYDAY: 1) Weigh yourself in the morning before breakfast. Write it  down and keep it in a log. 2) Take your medicines as prescribed 3) Eat low salt foods--Limit salt (sodium) to 2000 mg per day.  4) Stay as active as you can everyday 5) Limit all fluids for the day to less than 2 liters    At the [facility], you and your health needs are our priority. As part of our continuing mission to provide you with exceptional heart care, we have created designated Provider Care Teams. These Care Teams include your primary Cardiologist (physician) and Advanced Practice Providers (APPs- Physician Assistants and Nurse Practitioners) who all work together to provide you with the care you need, when you need it.   You may see any of the following providers on your designated Care Team at your next follow up: Marland Kitchen Dr Glori Bickers . Dr Loralie Champagne . Darrick Grinder, NP . Lyda Jester, PA . Audry Riles, PharmD   Please be sure to bring in all your medications bottles to every appointment.

## 2020-08-21 MED ORDER — VERQUVO 5 MG PO TABS: 5.0000 mg | ORAL_TABLET | Freq: Every day | ORAL | 11 refills | Status: DC

## 2020-08-21 NOTE — Addendum Note (Signed)
Encounter addended by: Scarlette Calico, RN on: 08/21/2020 5:06 PM  Actions taken: Order list changed

## 2020-08-21 NOTE — Progress Notes (Signed)
Cardiology: Dr. Aundra Dubin  HPI:  75 y.o. with history of rheumatoid arthritis, CKD stage 3, and nonischemic cardiomyopathy was referred by Dr. Andrey Cota in [place] to establish heart failure care in [place].  Patient has been known to have a cardiomyopathy since 2015.  Cardiac MRI in 2015 showed EF 37% with no LGE.  Coronary angiography at that time showed no significant coronary disease.  Over the next few years, LV EF fluctuated up and down.  In 3/21, echo showed EF down to 20-25%.  She was admitted to the hospital in [place], [place] in 11/21 with CHF exacerbation.  Echo showed EF 20-25%, global hypokinesis, mildly decreased RV systolic function.  Creatinine was noted to be elevated. She was diuresed and sent home, but was readmitted later in 11/21 with ongoing volume overload. Creatinine was up to 2.89.  She was taken off most of her cardiac meds due to soft BP and elevated creatinine. She was discharged to her daughter's house in [place].   Echo was repeated in 07/2020, EF < 20% with moderate LV dilation and mildly decreased RV systolic function. RHC was done, surprisingly showing low filling pressures and relatively preserved cardiac index at 2.22.   Patient recently presented to HF Clinic on 08/21/19 with Dr. Aundra Dubin. Came with her children.  She continued to have poor appetite/early satiety and complained of a band of pain across her upper abdomen.  She remained fatigued/short of breath after walking about 30-40 feet though symptoms tended to wax and wane.  Main complaint continued to be fatigue.  No lightheadedness. She had RA but reported minimal joint pain currently.  She was on prednisone 5 mg daily chronically.  She had chronic 3 pillow orthopnea.    Today she returns to HF clinic for pharmacist medication titration. At last visit with MD, Truett Mainland was increased to 5 mg daily and spironolactone 12.5 mg daily was initiated. Metoprolol succinate was decreased to 50 mg daily due to chronic  fatigue.  Of note, she was accidentally taking 37.5 mg (1.5 tablets) of the spironolactone because she read the instructions on the pill bottle incorrectly. Overall she is feeling ok today. Had recent ED visit for constipation. Still complains of poor appetite/earliy satiety and a band of pain across her abdomen. Does note the pain has slightly lessened since her constipation was addressed in the ED. Notes occasional dizziness, but this is normal for her. Still reports getting SOB walking 30-40 feet. Weight has been stable at home. Takes Bumex 3 mg BID. No LEE. Chronic 3 pillow orthopnea. She was recently approved for [facility] patient assistance for [facility].    HF Medications: Metoprolol succinate 50 mg daily Spironolactone 12.5 mg daily - taking 37.5 mg daily Vericiguat 5 mg daily Bumex 3 mg BID   Has the patient been experiencing any side effects to the medications prescribed?  no  Does the patient have any problems obtaining medications due to transportation or finances?   Has [employer]. Approved for [employer] patient assistance for [employer].   Understanding of regimen: good Understanding of indications: good Potential of compliance: good Patient understands to avoid NSAIDs. Patient understands to avoid decongestants.    Pertinent Lab Values: . 08/27/20: Serum creatinine 1.70, BUN 61, Potassium 3.6, Sodium 134,   Vital Signs: . Weight: 105.6 lbs (last clinic weight: 107.2 lbs) . Blood pressure: 98/56  . Heart rate: 94   Assessment/Plan: 1. Chronic systolic CHF: Nonischemic cardiomyopathy.  This has been known since 2015, cath in 2015 showed no coronary disease  and cardiac MRI in 2015 showed no LGE.  Cause is uncertain, familial cardiomyopathy is a concern given nonischemic cardiomyopathy in her mother.  Cannot rule out remote viral myocarditis.   Poor appetite and weight loss is concerning, as is elevated creatinine (suspect cardiorenal syndrome).  Low BP and elevated creatinine have  limited her cardiac med regimen. Most recent echo in 12/21 showed EF < 20% with normal RV systolic function. RHC showed low filling pressures and actually a relatively preserved cardiac index of 2.22.   - She is not volume overloaded on exam today,  NYHA class IIIb symptoms. - Continue Bumex 3 mg BID - Continue metoprolol succinate 50 mg daily. Recently decreased due to prominent fatigue. - Continue Verquvo 5 mg daily. Approved for [employer] patient assistance. Prescriptions for refills or dose changes should be faxed to [phone] or called in to [phone].  - Change spironolactone to 25 mg daily. Of note, had been taking 37.5 mg daily instead of 12.5 mg daily. Will not repeat labs as her labs were stable on the 37.5 mg dose.  - Dr. Aundra Dubin previously mentioned [employer] gene testing for possible familial cardiomyopathy. She will think about this.  - Though RHC was not markedly abnormal, Dr. Aundra Dubin previously commented that she may be nearing the time for consideration of advanced therapies. Referred for a CPX to assess her functional capacity and for prognostic purposes.  2. CKD: Stage 3.   - Referred to nephrology.  3. Rheumatoid arthritis: No history of lung involvement. She has been on a low dose of prednisone chronically.  - Referred to Dr. Amil Amen to establish care for RA.  4. GI discomfort: Band of pain across upper abdomen, worse after meals.  Early satiety.  It is possible that symptoms may be due to heart failure itself, but she would like GI evaluation which is reasonable.  - Referred to GI.    Audry Riles, PharmD, BCPS, BCCP, CPP Heart Failure Clinic Pharmacist [phone]

## 2020-08-21 NOTE — Telephone Encounter (Signed)
Sent in application, PA approval and physical RX via fax.  Will follow up.

## 2020-08-23 ENCOUNTER — Other Ambulatory Visit (HOSPITAL_COMMUNITY): Payer: Self-pay

## 2020-08-23 MED ORDER — RESTASIS 0.05 % OP EMUL: 1.0000 [drp] | Freq: Two times a day (BID) | OPHTHALMIC | 3 refills | Status: DC

## 2020-08-27 ENCOUNTER — Other Ambulatory Visit: Payer: Self-pay

## 2020-08-27 ENCOUNTER — Ambulatory Visit (HOSPITAL_COMMUNITY)
Admission: RE | Admit: 2020-08-27 | Discharge: 2020-08-27 | Disposition: A | Discharge status: Home / Self Care | Payer: Medicare PPO | Source: Ambulatory Visit | Attending: Internal Medicine | Admitting: Internal Medicine

## 2020-08-27 DIAGNOSIS — I5020 Unspecified systolic (congestive) heart failure: Secondary | ICD-10-CM

## 2020-08-27 LAB — BASIC METABOLIC PANEL
Anion gap: 13 (ref 5–15)
BUN: 61 mg/dL — ABNORMAL HIGH (ref 8–23)
CO2: 21 mmol/L — ABNORMAL LOW (ref 22–32)
Calcium: 10 mg/dL (ref 8.9–10.3)
Chloride: 100 mmol/L (ref 98–111)
Creatinine, Ser: 1.7 mg/dL — ABNORMAL HIGH (ref 0.44–1.00)
GFR, Estimated: 31 mL/min — ABNORMAL LOW (ref >60)
Glucose, Bld: 96 mg/dL (ref 70–99)
Potassium: 3.6 mmol/L (ref 3.5–5.1)
Sodium: 134 mmol/L — ABNORMAL LOW (ref 135–145)

## 2020-08-28 ENCOUNTER — Ambulatory Visit (HOSPITAL_COMMUNITY)
Admission: EM | Admit: 2020-08-28 | Discharge: 2020-08-28 | Disposition: A | Discharge status: Home / Self Care | Payer: Medicare PPO | Attending: Emergency Medicine | Admitting: Emergency Medicine

## 2020-08-28 ENCOUNTER — Encounter (HOSPITAL_COMMUNITY): Payer: Self-pay | Admitting: Emergency Medicine

## 2020-08-28 DIAGNOSIS — K5900 Constipation, unspecified: Secondary | ICD-10-CM | POA: Diagnosis not present

## 2020-08-28 MED ORDER — LACTULOSE 10 G PO PACK: 10.0000 g | PACK | Freq: Two times a day (BID) | ORAL | 0 refills | Status: DC

## 2020-08-28 NOTE — Discharge Instructions (Signed)
Take the lactulose as directed.    Go to the Emergency Department if you have acute worsening symptoms.

## 2020-08-28 NOTE — ED Triage Notes (Signed)
Pt presents with constipation xs 3-4 days. States has tried stool softeners and miralax. States is having muscle spasms in rectum. States this has happened before.

## 2020-08-28 NOTE — ED Provider Notes (Signed)
Tracy Nelson    CSN: [id number] Arrival date & time: 08/28/20  1921      History   Chief Complaint Chief Complaint  Patient presents with  . Constipation    HPI Tracy Nelson is a 75 y.o. female.   Patient presents with 3 to 4-day history of constipation.  Treatment attempted at home with stool softener and 1 dose of MiraLAX taken today.  She states she is uncomfortable in her rectum.  She denies fever, chills, abdominal pain, vomiting, or other symptoms.  Her medical history includes hypertension, palpitations, dilated cardiomyopathy, CHF, arthritis, GERD, colitis.  The history is provided by the patient and a relative.    Past Medical History:  Diagnosis Date  . Acid reflux 07/15/2020  . Arthritis 07/15/2020  . CHF (congestive heart failure) ([place])   . Colitis 07/15/2020  . Dilated cardiomyopathy ([place]) 07/15/2020  . HFrEF (heart failure with reduced ejection fraction) ([place]) 07/15/2020  . HTN (hypertension) 07/15/2020  . Osteoporosis 07/15/2020  . Palpitations 07/15/2020  . Rheumatoid arthritis ([place]) 07/15/2020    Patient Active Problem List   Diagnosis Date Noted  . Arthritis 07/15/2020  . Acid reflux 07/15/2020  . Colitis 07/15/2020  . Dilated cardiomyopathy ([place]) 07/15/2020  . HFrEF (heart failure with reduced ejection fraction) ([place]) 07/15/2020  . HTN (hypertension) 07/15/2020  . Osteoporosis 07/15/2020  . Palpitations 07/15/2020  . Rheumatoid arthritis ([place]) 07/15/2020    Past Surgical History:  Procedure Laterality Date  . RIGHT HEART CATH N/A 08/14/2020   Procedure: RIGHT HEART CATH;  Surgeon: [name], MD;  Location: [facility];  Service: Cardiovascular;  Laterality: N/A;    OB History   No obstetric history on file.      Home Medications    Prior to Admission medications   Medication Sig Start Date End Date Taking? Authorizing Provider  acetaminophen (TYLENOL) 500 MG tablet Take 500-1,000 mg by mouth every 6 (six) hours as  needed for moderate pain.    [provider]  aspirin EC 81 MG tablet Take 1 tablet (81 mg total) by mouth daily. Swallow whole. 08/08/20   [name], MD  azaTHIOprine (IMURAN) 50 MG tablet Take 1 tablet (50 mg total) by mouth daily. 08/08/20   [name], MD  bumetanide (BUMEX) 1 MG tablet Take 3 tablets (3 mg total) by mouth 2 (two) times daily. 08/08/20   [name], MD  cholestyramine Lucrezia Starch) 4 g packet Take 1 packet (4 g total) by mouth daily. 08/08/20   [name], MD  cycloSPORINE (RESTASIS) 0.05 % ophthalmic emulsion Place 1 drop into both eyes 2 (two) times daily. 08/23/20   [name], MD  Ensure (ENSURE) Take 237 mLs by mouth daily.    [provider]  ergocalciferol (VITAMIN D2) 1.25 MG (50000 UT) capsule Take 1 capsule (50,000 Units total) by mouth once a week. Patient taking differently: Take 50,000 Units by mouth every Monday. 08/08/20   [name], MD  folic acid (FOLVITE) 1 MG tablet Take 1 tablet (1 mg total) by mouth daily. 08/08/20   [name], MD  hydroxychloroquine (PLAQUENIL) 200 MG tablet Take 1 tablet (200 mg total) by mouth daily. 08/08/20   [name], MD  ibandronate (BONIVA) 150 MG tablet Take 150 mg by mouth every 30 (thirty) days. Take in the morning with a full glass of water, on an empty stomach, and do not take anything else by mouth or lie  down for the next 30 min.    [provider]  lactulose (CEPHULAC) 10 g packet Take 1 packet (10 g total) by mouth 2 (two) times daily. 08/28/20   Sharion Balloon, NP  Menthol, Topical Analgesic, (ICY HOT BACK EX) Apply 1 application topically daily as needed (pain).    [provider]  metoprolol succinate (TOPROL XL) 50 MG 24 hr tablet Take 1 tablet (50 mg total) by mouth daily. Take with or immediately following a meal. 08/20/20   [name], MD  MITIGARE 0.6 MG CAPS Take 0.6 mg by mouth daily as needed (gout). 07/18/20   [provider]  Multiple Vitamin (MULTIVITAMIN) tablet Take 1 tablet by mouth daily.    [provider]  omeprazole (PRILOSEC) 40 MG capsule Take 40 mg by mouth daily.    [provider]  predniSONE (DELTASONE) 5 MG tablet Take 1 tablet (5 mg total) by mouth daily with breakfast. 08/08/20   [name], MD  spironolactone (ALDACTONE) 25 MG tablet Take 0.5 tablets (12.5 mg total) by mouth daily. 08/20/20   [name], MD  Vericiguat (VERQUVO) 5 MG TABS Take 1 tablet (5 mg total) by mouth daily. 08/21/20   [name], MD    Family History History reviewed. No pertinent family history.  Social History Social History   Tobacco Use  . Smoking status: Never Smoker  . Smokeless tobacco: Never Used  Substance Use Topics  . Alcohol use: Never  . Drug use: Never     Allergies   Baclofen and Penicillins   Review of Systems Review of Systems  Constitutional: Negative for chills and fever.  HENT: Negative for ear pain and sore throat.   Eyes: Negative for pain and visual disturbance.  Respiratory: Negative for cough and shortness of breath.   Cardiovascular: Negative for chest pain and palpitations.  Gastrointestinal: Positive for constipation. Negative for abdominal pain, diarrhea, nausea and vomiting.  Genitourinary: Negative for dysuria and hematuria.  Musculoskeletal: Negative for arthralgias and back pain.  Skin: Negative for color change and rash.  Neurological: Negative for seizures and syncope.  All other systems reviewed and are negative.    Physical Exam Triage Vital Signs ED Triage Vitals [08/28/20 2005]  Enc Vitals Group     BP      Pulse      Resp      Temp      Temp src      SpO2      Weight      Height      Head Circumference      Peak Flow      Pain Score 10     Pain Loc      Pain Edu?      Excl. in [place]?    No data found.  Updated Vital Signs BP 101/72 (BP Location: Right Arm)   Pulse 93   Temp 98.2 F (36.8 C)  (Oral)   Resp 17   SpO2 97%   Visual Acuity Right Eye Distance:   Left Eye Distance:   Bilateral Distance:    Right Eye Near:   Left Eye Near:    Bilateral Near:     Physical Exam Vitals and nursing note reviewed.  Constitutional:      General: She is not in acute distress.    Appearance: She is well-developed and well-nourished.  HENT:     Head: Normocephalic and atraumatic.     Mouth/Throat:  Mouth: Mucous membranes are moist.  Eyes:     Conjunctiva/sclera: Conjunctivae normal.  Cardiovascular:     Rate and Rhythm: Normal rate and regular rhythm.     Heart sounds: Normal heart sounds.  Pulmonary:     Effort: Pulmonary effort is normal. No respiratory distress.     Breath sounds: Normal breath sounds.  Abdominal:     General: Bowel sounds are normal.     Palpations: Abdomen is soft.     Tenderness: There is no abdominal tenderness. There is no guarding or rebound.  Musculoskeletal:        General: No edema.     Cervical back: Neck supple.  Skin:    General: Skin is warm and dry.  Neurological:     General: No focal deficit present.     Mental Status: She is alert and oriented to person, place, and time.  Psychiatric:        Mood and Affect: Mood and affect and mood normal.        Behavior: Behavior normal.      UC Treatments / Results  Labs (all labs ordered are listed, but only abnormal results are displayed) Labs Reviewed - No data to display  EKG   Radiology No results found.  Procedures Procedures (including critical care time)  Medications Ordered in UC Medications - No data to display  Initial Impression / Assessment and Plan / UC Course  I have reviewed the triage vital signs and the nursing notes.  Pertinent labs & imaging results that were available during my care of the patient were reviewed by me and considered in my medical decision making (see chart for details).   Constipation.  Treating with lactulose.  Education provided on  constipation.  Instructed patient to go to the ED if she has acute worsening symptoms.  She agrees to plan of care.   Final Clinical Impressions(s) / UC Diagnoses   Final diagnoses:  Constipation, unspecified constipation type     Discharge Instructions     Take the lactulose as directed.    Go to the Emergency Department if you have acute worsening symptoms.        ED Prescriptions    Medication Sig Dispense Auth. Provider   lactulose (CEPHULAC) 10 g packet  (Status: Discontinued) Take 1 packet (10 g total) by mouth 2 (two) times daily. 6 each Sharion Balloon, NP   lactulose (CEPHULAC) 10 g packet Take 1 packet (10 g total) by mouth 2 (two) times daily. 6 each Sharion Balloon, NP     PDMP not reviewed this encounter.   Sharion Balloon, NP 08/28/20 2035

## 2020-08-29 ENCOUNTER — Emergency Department (HOSPITAL_BASED_OUTPATIENT_CLINIC_OR_DEPARTMENT_OTHER)
Admission: EM | Admit: 2020-08-29 | Discharge: 2020-08-29 | Disposition: A | Discharge status: Home / Self Care | Payer: Medicare PPO | Attending: Emergency Medicine | Admitting: Emergency Medicine

## 2020-08-29 ENCOUNTER — Other Ambulatory Visit: Payer: Self-pay

## 2020-08-29 ENCOUNTER — Emergency Department (HOSPITAL_BASED_OUTPATIENT_CLINIC_OR_DEPARTMENT_OTHER): Payer: Medicare PPO

## 2020-08-29 ENCOUNTER — Encounter (HOSPITAL_BASED_OUTPATIENT_CLINIC_OR_DEPARTMENT_OTHER): Payer: Self-pay

## 2020-08-29 DIAGNOSIS — K5641 Fecal impaction: Secondary | ICD-10-CM

## 2020-08-29 DIAGNOSIS — K5900 Constipation, unspecified: Secondary | ICD-10-CM | POA: Diagnosis not present

## 2020-08-29 DIAGNOSIS — I110 Hypertensive heart disease with heart failure: Secondary | ICD-10-CM | POA: Diagnosis not present

## 2020-08-29 DIAGNOSIS — I509 Heart failure, unspecified: Secondary | ICD-10-CM | POA: Insufficient documentation

## 2020-08-29 DIAGNOSIS — Z79899 Other long term (current) drug therapy: Secondary | ICD-10-CM | POA: Insufficient documentation

## 2020-08-29 DIAGNOSIS — K648 Other hemorrhoids: Secondary | ICD-10-CM | POA: Diagnosis not present

## 2020-08-29 DIAGNOSIS — Z7982 Long term (current) use of aspirin: Secondary | ICD-10-CM | POA: Diagnosis not present

## 2020-08-29 LAB — CBC WITH DIFFERENTIAL/PLATELET
Abs Immature Granulocytes: 0.05 10*3/uL (ref 0.00–0.07)
Basophils Absolute: 0.1 10*3/uL (ref 0.0–0.1)
Basophils Relative: 0 %
Eosinophils Absolute: 0 10*3/uL (ref 0.0–0.5)
Eosinophils Relative: 0 %
HCT: 38.4 % (ref 36.0–46.0)
Hemoglobin: 12.5 g/dL (ref 12.0–15.0)
Immature Granulocytes: 0 %
Lymphocytes Relative: 5 %
Lymphs Abs: 0.6 10*3/uL — ABNORMAL LOW (ref 0.7–4.0)
MCH: 27.3 pg (ref 26.0–34.0)
MCHC: 32.6 g/dL (ref 30.0–36.0)
MCV: 83.8 fL (ref 80.0–100.0)
Monocytes Absolute: 0.9 10*3/uL (ref 0.1–1.0)
Monocytes Relative: 7 %
Neutro Abs: 10.6 10*3/uL — ABNORMAL HIGH (ref 1.7–7.7)
Neutrophils Relative %: 88 %
Platelets: 299 10*3/uL (ref 150–400)
RBC: 4.58 MIL/uL (ref 3.87–5.11)
RDW: 20 % — ABNORMAL HIGH (ref 11.5–15.5)
WBC: 12.2 10*3/uL — ABNORMAL HIGH (ref 4.0–10.5)
nRBC: 0 % (ref 0.0–0.2)

## 2020-08-29 LAB — COMPREHENSIVE METABOLIC PANEL
ALT: 19 U/L (ref 0–44)
AST: 26 U/L (ref 15–41)
Albumin: 3.9 g/dL (ref 3.5–5.0)
Alkaline Phosphatase: 72 U/L (ref 38–126)
Anion gap: 15 (ref 5–15)
BUN: 68 mg/dL — ABNORMAL HIGH (ref 8–23)
CO2: 22 mmol/L (ref 22–32)
Calcium: 9.9 mg/dL (ref 8.9–10.3)
Chloride: 97 mmol/L — ABNORMAL LOW (ref 98–111)
Creatinine, Ser: 1.9 mg/dL — ABNORMAL HIGH (ref 0.44–1.00)
GFR, Estimated: 27 mL/min — ABNORMAL LOW (ref >60)
Glucose, Bld: 125 mg/dL — ABNORMAL HIGH (ref 70–99)
Potassium: 4 mmol/L (ref 3.5–5.1)
Sodium: 134 mmol/L — ABNORMAL LOW (ref 135–145)
Total Bilirubin: 0.9 mg/dL (ref 0.3–1.2)
Total Protein: 7.8 g/dL (ref 6.5–8.1)

## 2020-08-29 LAB — OCCULT BLOOD X 1 CARD TO LAB, STOOL: Fecal Occult Bld: POSITIVE — AB

## 2020-08-29 MED ORDER — SODIUM CHLORIDE 0.9 % IV BOLUS
1000.0000 mL | Freq: Once | INTRAVENOUS | Status: AC
  Administered 2020-08-29: 1000 mL via INTRAVENOUS

## 2020-08-29 MED ORDER — LIDOCAINE HCL URETHRAL/MUCOSAL 2 % EX GEL
1.0000 "application " | Freq: Once | CUTANEOUS | Status: AC
  Administered 2020-08-29: 1 via TOPICAL
  Filled 2020-08-29: qty 11

## 2020-08-29 MED ORDER — POLYETHYLENE GLYCOL 3350 17 G PO PACK: 17.0000 g | PACK | Freq: Two times a day (BID) | ORAL | 0 refills | Status: DC

## 2020-08-29 MED ORDER — FLEET ENEMA 7-19 GM/118ML RE ENEM
1.0000 | ENEMA | Freq: Once | RECTAL | Status: AC
  Administered 2020-08-29: 1 via RECTAL
  Filled 2020-08-29: qty 1

## 2020-08-29 NOTE — ED Triage Notes (Signed)
Pt reports 4-5 days of constipation with rectal pain and spasms. Pt was seen at [facility] yesterday and has taken stool softeners and other medication without relief. Pt also reports bleeding from rectum.

## 2020-08-29 NOTE — ED Notes (Signed)
Pt utilizing bedside commode.

## 2020-08-29 NOTE — Discharge Instructions (Signed)
You have a lot of stool in your rectum   Stop lactulose. Take miralax twice daily for a week   See your doctor for follow up   Return to ER if you have worse abdominal pain, rectal bleeding, vomiting, fever

## 2020-08-29 NOTE — ED Notes (Signed)
Pt sitting on bedside commode.

## 2020-08-29 NOTE — ED Notes (Signed)
Assisted pt to bedside commode.

## 2020-08-29 NOTE — ED Notes (Signed)
Unable to obtain vitals at this time r/t preforming enema

## 2020-08-29 NOTE — ED Provider Notes (Signed)
[facility] EMERGENCY DEPARTMENT Provider Note   CSN: [id number] Arrival date & time: 08/29/20  1510     History Chief Complaint  Patient presents with  . Constipation  . Hypotension    Tracy Nelson is a 75 y.o. female hx of CHF, HTN, here presenting with constipation and hypotension.  Patient states that she had no bowel movement for the last week or so.  She went to urgent care yesterday and was prescribed lactulose.  She took 2 doses of lactulose and states that she had severe abdominal cramps.  She states that she has some rectal spasms and try to have bowel movement but just some liquid came out.  Patient was noted to be hypotensive.  Patient states that her abdomen is distended but she has no vomiting or fevers.   The history is provided by the patient.       Past Medical History:  Diagnosis Date  . Acid reflux 07/15/2020  . Arthritis 07/15/2020  . CHF (congestive heart failure) ([place])   . Colitis 07/15/2020  . Dilated cardiomyopathy ([place]) 07/15/2020  . HFrEF (heart failure with reduced ejection fraction) ([place]) 07/15/2020  . HTN (hypertension) 07/15/2020  . Osteoporosis 07/15/2020  . Palpitations 07/15/2020  . Rheumatoid arthritis ([place]) 07/15/2020    Patient Active Problem List   Diagnosis Date Noted  . Arthritis 07/15/2020  . Acid reflux 07/15/2020  . Colitis 07/15/2020  . Dilated cardiomyopathy ([place]) 07/15/2020  . HFrEF (heart failure with reduced ejection fraction) ([place]) 07/15/2020  . HTN (hypertension) 07/15/2020  . Osteoporosis 07/15/2020  . Palpitations 07/15/2020  . Rheumatoid arthritis ([place]) 07/15/2020    Past Surgical History:  Procedure Laterality Date  . RIGHT HEART CATH N/A 08/14/2020   Procedure: RIGHT HEART CATH;  Surgeon: [name], MD;  Location: [facility];  Service: Cardiovascular;  Laterality: N/A;     OB History   No obstetric history on file.     History reviewed. No pertinent family history.  Social History    Tobacco Use  . Smoking status: Never Smoker  . Smokeless tobacco: Never Used  Substance Use Topics  . Alcohol use: Never  . Drug use: Never    Home Medications Prior to Admission medications   Medication Sig Start Date End Date Taking? Authorizing Provider  acetaminophen (TYLENOL) 500 MG tablet Take 500-1,000 mg by mouth every 6 (six) hours as needed for moderate pain.    [provider]  aspirin EC 81 MG tablet Take 1 tablet (81 mg total) by mouth daily. Swallow whole. 08/08/20   [name], MD  azaTHIOprine (IMURAN) 50 MG tablet Take 1 tablet (50 mg total) by mouth daily. 08/08/20   [name], MD  bumetanide (BUMEX) 1 MG tablet Take 3 tablets (3 mg total) by mouth 2 (two) times daily. 08/08/20   [name], MD  cholestyramine Lucrezia Starch) 4 g packet Take 1 packet (4 g total) by mouth daily. 08/08/20   [name], MD  cycloSPORINE (RESTASIS) 0.05 % ophthalmic emulsion Place 1 drop into both eyes 2 (two) times daily. 08/23/20   [name], MD  Ensure (ENSURE) Take 237 mLs by mouth daily.    [provider]  ergocalciferol (VITAMIN D2) 1.25 MG (50000 UT) capsule Take 1 capsule (50,000 Units total) by mouth once a week. Patient taking differently: Take 50,000 Units by mouth every Monday. 08/08/20   [name], MD  folic acid (FOLVITE) 1 MG tablet Take 1  tablet (1 mg total) by mouth daily. 08/08/20   [name], MD  hydroxychloroquine (PLAQUENIL) 200 MG tablet Take 1 tablet (200 mg total) by mouth daily. 08/08/20   [name], MD  ibandronate (BONIVA) 150 MG tablet Take 150 mg by mouth every 30 (thirty) days. Take in the morning with a full glass of water, on an empty stomach, and do not take anything else by mouth or lie down for the next 30 min.    [provider]  lactulose (CEPHULAC) 10 g packet Take 1 packet (10 g total) by mouth 2 (two) times daily. 08/28/20   Sharion Balloon, NP  Menthol, Topical Analgesic, (ICY HOT  BACK EX) Apply 1 application topically daily as needed (pain).    [provider]  metoprolol succinate (TOPROL XL) 50 MG 24 hr tablet Take 1 tablet (50 mg total) by mouth daily. Take with or immediately following a meal. 08/20/20   [name], MD  MITIGARE 0.6 MG CAPS Take 0.6 mg by mouth daily as needed (gout). 07/18/20   [provider]  Multiple Vitamin (MULTIVITAMIN) tablet Take 1 tablet by mouth daily.    [provider]  omeprazole (PRILOSEC) 40 MG capsule Take 40 mg by mouth daily.    [provider]  predniSONE (DELTASONE) 5 MG tablet Take 1 tablet (5 mg total) by mouth daily with breakfast. 08/08/20   [name], MD  spironolactone (ALDACTONE) 25 MG tablet Take 0.5 tablets (12.5 mg total) by mouth daily. 08/20/20   [name], MD  Vericiguat (VERQUVO) 5 MG TABS Take 1 tablet (5 mg total) by mouth daily. 08/21/20   [name], MD    Allergies    Baclofen and Penicillins  Review of Systems   Review of Systems  Gastrointestinal: Positive for abdominal distention and constipation.  All other systems reviewed and are negative.   Physical Exam Updated Vital Signs BP (!) 89/66   Pulse 81   Temp 98.6 F (37 C) (Oral)   Resp 18   Ht 5\' 4"  (1.626 m)   Wt 47.6 kg   SpO2 99%   BMI 18.02 kg/m   Physical Exam Vitals and nursing note reviewed.  Constitutional:      Comments: Uncomfortable,  HENT:     Head: Normocephalic.     Nose: Nose normal.     Mouth/Throat:     Mouth: Mucous membranes are dry.  Eyes:     Extraocular Movements: Extraocular movements intact.     Conjunctiva/sclera: Conjunctivae normal.     Pupils: Pupils are equal, round, and reactive to light.  Cardiovascular:     Rate and Rhythm: Normal rate and regular rhythm.     Pulses: Normal pulses.     Heart sounds: Normal heart sounds.  Pulmonary:     Breath sounds: Normal breath sounds.  Abdominal:     Comments: Mildly distended, nontender   Genitourinary:    Comments: Rectal-stool impaction with small external hemorrhoid Musculoskeletal:        General: Normal range of motion.     Cervical back: Normal range of motion and neck supple.  Skin:    General: Skin is warm.     Capillary Refill: Capillary refill takes less than 2 seconds.  Neurological:     General: No focal deficit present.     Mental Status: She is oriented to person, place, and time.  Psychiatric:        Mood and Affect:  Mood normal.        Behavior: Behavior normal.     ED Results / Procedures / Treatments   Labs (all labs ordered are listed, but only abnormal results are displayed) Labs Reviewed  CBC WITH DIFFERENTIAL/PLATELET  COMPREHENSIVE METABOLIC PANEL  OCCULT BLOOD X 1 CARD TO LAB, STOOL    EKG None  Radiology No results found.  Procedures Fecal disimpaction  Date/Time: 08/29/2020 6:35 PM Performed by: Drenda Freeze, MD Authorized by: Drenda Freeze, MD  Consent: Verbal consent obtained. Risks and benefits: risks, benefits and alternatives were discussed Consent given by: patient Patient understanding: patient states understanding of the procedure being performed Patient consent: the patient's understanding of the procedure matches consent given Procedure consent: procedure consent matches procedure scheduled Relevant documents: relevant documents present and verified Test results: test results available and properly labeled Required items: required blood products, implants, devices, and special equipment available Patient identity confirmed: verbally with patient Preparation: Patient was prepped and draped in the usual sterile fashion. Local anesthesia used: yes  Anesthesia: Local anesthesia used: yes Local Anesthetic: topical anesthetic Anesthetic total: 10 mL  Sedation: Patient sedated: no  Patient tolerance: patient tolerated the procedure well with no immediate complications    (including critical care  time)  .  Medications Ordered in ED Medications  sodium chloride 0.9 % bolus 1,000 mL (has no administration in time range)  lidocaine (XYLOCAINE) 2 % jelly 1 application (has no administration in time range)    ED Course  I have reviewed the triage vital signs and the nursing notes.  Pertinent labs & imaging results that were available during my care of the patient were reviewed by me and considered in my medical decision making (see chart for details).    MDM Rules/Calculators/A&P                         Cortny Bambach is a 75 y.o. female here presenting with constipation.  Patient appears to have stool impaction.  His hypotension is likely from vasovagal episode.  Plan to get CBC and CMP and hydrate patient.  We will also disimpact patient and give enema   6:35 PM She had large bowel movement after disimpaction and enema. Xray showed constipation and no SBO. Felt better after bowel movement. Of note, guiac positive but that is in the setting of stool impaction and her hemoglobin is stable. Will dc home with miralax   Final Clinical Impression(s) / ED Diagnoses Final diagnoses:  None    Rx / DC Orders ED Discharge Orders    None       Drenda Freeze, MD 08/29/20 Bosie Helper

## 2020-09-04 ENCOUNTER — Ambulatory Visit (HOSPITAL_COMMUNITY)
Admission: RE | Admit: 2020-09-04 | Discharge: 2020-09-04 | Disposition: A | Discharge status: Home / Self Care | Payer: Medicare PPO | Source: Ambulatory Visit | Attending: Internal Medicine | Admitting: Internal Medicine

## 2020-09-04 ENCOUNTER — Other Ambulatory Visit: Payer: Self-pay

## 2020-09-04 VITALS — BP 98/56 | HR 94 | Wt 105.6 lb

## 2020-09-04 DIAGNOSIS — M069 Rheumatoid arthritis, unspecified: Secondary | ICD-10-CM | POA: Diagnosis not present

## 2020-09-04 DIAGNOSIS — N183 Chronic kidney disease, stage 3 unspecified: Secondary | ICD-10-CM | POA: Diagnosis not present

## 2020-09-04 DIAGNOSIS — I5022 Chronic systolic (congestive) heart failure: Secondary | ICD-10-CM | POA: Insufficient documentation

## 2020-09-04 DIAGNOSIS — I428 Other cardiomyopathies: Secondary | ICD-10-CM | POA: Insufficient documentation

## 2020-09-04 DIAGNOSIS — R6881 Early satiety: Secondary | ICD-10-CM | POA: Diagnosis not present

## 2020-09-04 DIAGNOSIS — R198 Other specified symptoms and signs involving the digestive system and abdomen: Secondary | ICD-10-CM | POA: Diagnosis not present

## 2020-09-04 DIAGNOSIS — Z79899 Other long term (current) drug therapy: Secondary | ICD-10-CM | POA: Insufficient documentation

## 2020-09-04 MED ORDER — BUMETANIDE 1 MG PO TABS: 3.0000 mg | ORAL_TABLET | Freq: Two times a day (BID) | ORAL | 3 refills | Status: DC

## 2020-09-04 MED ORDER — SPIRONOLACTONE 25 MG PO TABS: 25.0000 mg | ORAL_TABLET | Freq: Every day | ORAL | 3 refills | Status: DC

## 2020-09-04 NOTE — Patient Instructions (Addendum)
It was a pleasure seeing you today!  MEDICATIONS: -We are changing your medications today -Start taking spironolactone to 25 mg (1 tablet) daily -Call if you have questions about your medications.   NEXT APPOINTMENT: Return to clinic in 1 month with Dr. Aundra Dubin.  In general, to take care of your heart failure: -Limit your fluid intake to 2 Liters (half-gallon) per day.   -Limit your salt intake to ideally 2-3 grams (2000-3000 mg) per day. -Weigh yourself daily and record, and bring that "weight diary" to your next appointment.  (Weight gain of 2-3 pounds in 1 day typically means fluid weight.) -The medications for your heart are to help your heart and help you live longer.   -Please contact us before stopping any of your heart medications.  Call the clinic at [phone] with questions or to reschedule future appointments.

## 2020-09-04 NOTE — Telephone Encounter (Signed)
Advanced Heart Failure Patient Advocate Encounter   Patient was approved to receive Verquvo from [employer]  Patient ID: [id number] Effective dates: 08/28/20 through 08/09/21  Charlann Boxer, CPhT

## 2020-09-06 ENCOUNTER — Telehealth (HOSPITAL_COMMUNITY): Payer: Self-pay

## 2020-09-06 NOTE — Telephone Encounter (Signed)
Pt insurance is active and benefits verified through [facility]. Co-pay $20.00, DED $0.00/$0.00 met, out of pocket $0.00/$0.00 met, co-insurance 0%. No pre-authorization required. Passport, 09/06/20 @ 2:31PM, IPJ#[id number]  Will contact patient to see if she is interested in the Cardiac Rehab Program. If interested, patient will need to complete follow up appt. Once completed, patient will be contacted for scheduling upon review by the RN Navigator.

## 2020-09-06 NOTE — Telephone Encounter (Signed)
Called and spoke with pt in regards to CR, pt stated she is interested in CR. Adv pt she will need to schedule and complete a CPX before she can start CR. Also adv pt she would need to contact Dr. Aundra Dubin office to schedule her CPX.  Explained scheduling process and went over insurance, patient verbalized understanding. Will contact patient for scheduling once f/u has been completed.  Placed pt ppw in needs appt black bin

## 2020-09-17 ENCOUNTER — Telehealth (HOSPITAL_COMMUNITY): Payer: Self-pay | Admitting: *Deleted

## 2020-09-17 ENCOUNTER — Telehealth (HOSPITAL_COMMUNITY): Payer: Self-pay | Admitting: Cardiology

## 2020-09-17 NOTE — Telephone Encounter (Signed)
Pt aware and referral placed.  

## 2020-09-17 NOTE — Telephone Encounter (Signed)
Opened in error

## 2020-09-17 NOTE — Telephone Encounter (Signed)
Would recommend she take Zinc.  Also see if she is candidate for monoclonal antibody infusion.

## 2020-09-17 NOTE — Telephone Encounter (Signed)
pts caregiver left vm that patient tested postive for covid and has a bad cough and just a few other mild symptoms. Pt took a home test this morning. Caregiver asked if there is anything pt needs take to relieve symptoms. I called back and spoke with patient and gave her a list of over the counter medications to manage cough. Also routed message to [place] for any further advice.

## 2020-09-18 ENCOUNTER — Ambulatory Visit (HOSPITAL_COMMUNITY)
Admission: RE | Admit: 2020-09-18 | Discharge: 2020-09-18 | Disposition: A | Discharge status: Home / Self Care | Payer: Medicare PPO | Source: Ambulatory Visit | Attending: Pulmonary Disease | Admitting: Pulmonary Disease

## 2020-09-18 ENCOUNTER — Other Ambulatory Visit: Payer: Self-pay | Admitting: Family

## 2020-09-18 ENCOUNTER — Telehealth: Payer: Self-pay | Admitting: Family

## 2020-09-18 ENCOUNTER — Telehealth (HOSPITAL_COMMUNITY): Payer: Self-pay | Admitting: Licensed Clinical Social Worker

## 2020-09-18 ENCOUNTER — Encounter (HOSPITAL_COMMUNITY): Payer: Self-pay | Admitting: *Deleted

## 2020-09-18 DIAGNOSIS — U071 COVID-19: Secondary | ICD-10-CM

## 2020-09-18 MED ORDER — METHYLPREDNISOLONE SODIUM SUCC 125 MG IJ SOLR: 125.0000 mg | Freq: Once | INTRAMUSCULAR | Status: DC | PRN

## 2020-09-18 MED ORDER — DIPHENHYDRAMINE HCL 50 MG/ML IJ SOLN: 50.0000 mg | Freq: Once | INTRAMUSCULAR | Status: DC | PRN

## 2020-09-18 MED ORDER — SODIUM CHLORIDE 0.9 % IV SOLN: INTRAVENOUS | Status: DC | PRN

## 2020-09-18 MED ORDER — SOTROVIMAB 500 MG/8ML IV SOLN
500.0000 mg | Freq: Once | INTRAVENOUS | Status: AC
  Administered 2020-09-18: 500 mg via INTRAVENOUS

## 2020-09-18 MED ORDER — FAMOTIDINE IN NACL 20-0.9 MG/50ML-% IV SOLN: 20.0000 mg | Freq: Once | INTRAVENOUS | Status: DC | PRN

## 2020-09-18 MED ORDER — EPINEPHRINE 0.3 MG/0.3ML IJ SOAJ: 0.3000 mg | Freq: Once | INTRAMUSCULAR | Status: DC | PRN

## 2020-09-18 MED ORDER — ALBUTEROL SULFATE HFA 108 (90 BASE) MCG/ACT IN AERS: 2.0000 | INHALATION_SPRAY | Freq: Once | RESPIRATORY_TRACT | Status: DC | PRN

## 2020-09-18 NOTE — Progress Notes (Signed)
Diagnosis: COVID-19  Physician: Dr. Patrick Wright  Procedure: Covid Infusion Clinic Med: Sotrovimab infusion - Provided patient with sotrovimab fact sheet for patients, parents, and caregivers prior to infusion.   Complications: No immediate complications noted  Discharge: Discharged home    

## 2020-09-18 NOTE — Telephone Encounter (Signed)
CSW received call from pt sister requesting help tracking down [employer] paperwork for her job so she can come watch her sister while her niece (who the pt lives with) is out of town.  CSW checked with clinic staff who faxed paperwork out today and was able to scan and email sister copy of her paperwork.  Will continue to follow and assist as needed  Jorge Ny, [facility] Desk#: [phone] Cell#: [phone]

## 2020-09-18 NOTE — Discharge Instructions (Signed)

## 2020-09-18 NOTE — Telephone Encounter (Signed)
Called to discuss with patient about COVID-19 symptoms and the use of one of the available treatments for those with mild to moderate Covid symptoms and at a high risk of hospitalization.  Pt appears to qualify for outpatient treatment due to co-morbid conditions and/or a member of an at-risk group in accordance with the FDA Emergency Use Authorization.    Symptom onset: 09/16/20 Positive test: 09/17/20 at home (threw away) Vaccinated: No Booster? No Immunocompromised? Yes Qualifiers: age, BMI, ethnicity  I connected by phone with Tracy Nelson on 09/18/2020 at 10:17 AM to discuss the potential use of a new treatment for mild to moderate COVID-19 viral infection in non-hospitalized patients.  This patient is a 75 y.o. female that meets the FDA criteria for Emergency Use Authorization of COVID monoclonal antibody sotrovimab.  Has a (+) direct SARS-CoV-2 viral test result  Has mild or moderate COVID-19   Is NOT hospitalized due to COVID-19  Is within 10 days of symptom onset  Has at least one of the high risk factor(s) for progression to severe COVID-19 and/or hospitalization as defined in EUA.  Specific high risk criteria : Older age (>/= 75 yo), BMI > 25, Immunosuppressive Disease or Treatment and Cardiovascular disease or hypertension   I have spoken and communicated the following to the patient or parent/caregiver regarding COVID monoclonal antibody treatment:  1. FDA has authorized the emergency use for the treatment of mild to moderate COVID-19 in adults and pediatric patients with positive results of direct SARS-CoV-2 viral testing who are 44 years of age and older weighing at least 40 kg, and who are at high risk for progressing to severe COVID-19 and/or hospitalization.  2. The significant known and potential risks and benefits of COVID monoclonal antibody, and the extent to which such potential risks and benefits are unknown.  3. Information on available alternative treatments and  the risks and benefits of those alternatives, including clinical trials.  4. Patients treated with COVID monoclonal antibody should continue to self-isolate and use infection control measures (e.g., wear mask, isolate, social distance, avoid sharing personal items, clean and disinfect "high touch" surfaces, and frequent handwashing) according to CDC guidelines.   5. The patient or parent/caregiver has the option to accept or refuse COVID monoclonal antibody treatment.  After reviewing this information with the patient, the patient has agreed to receive one of the available covid 19 monoclonal antibodies and will be provided an appropriate fact sheet prior to infusion. [name], NP 09/18/2020 10:17 AM

## 2020-09-18 NOTE — Progress Notes (Signed)
FMLA forms for pt's sister fJennifer Florene Glen completed and faxed into [employer] at [phone], [name] is aware and copy has been emailed to her per her request

## 2020-09-18 NOTE — Progress Notes (Signed)
Patient reviewed Fact Sheet for Patients, Parents, and Caregivers for Emergency Use Authorization (EUA) of sotrovimab for the Treatment of Coronavirus. Patient also reviewed and is agreeable to the estimated cost of treatment. Patient is agreeable to proceed.   

## 2020-10-02 ENCOUNTER — Other Ambulatory Visit: Payer: Self-pay

## 2020-10-02 ENCOUNTER — Encounter (HOSPITAL_COMMUNITY): Payer: Self-pay | Admitting: Cardiology

## 2020-10-02 ENCOUNTER — Ambulatory Visit (HOSPITAL_COMMUNITY)
Admission: RE | Admit: 2020-10-02 | Discharge: 2020-10-02 | Disposition: A | Discharge status: Home / Self Care | Payer: Medicare PPO | Source: Ambulatory Visit | Attending: Cardiology | Admitting: Cardiology

## 2020-10-02 VITALS — BP 112/70 | HR 86 | Wt 102.6 lb

## 2020-10-02 DIAGNOSIS — I428 Other cardiomyopathies: Secondary | ICD-10-CM | POA: Diagnosis not present

## 2020-10-02 DIAGNOSIS — Z7901 Long term (current) use of anticoagulants: Secondary | ICD-10-CM | POA: Insufficient documentation

## 2020-10-02 DIAGNOSIS — I5022 Chronic systolic (congestive) heart failure: Secondary | ICD-10-CM | POA: Diagnosis not present

## 2020-10-02 DIAGNOSIS — R52 Pain, unspecified: Secondary | ICD-10-CM

## 2020-10-02 DIAGNOSIS — Z8249 Family history of ischemic heart disease and other diseases of the circulatory system: Secondary | ICD-10-CM | POA: Diagnosis not present

## 2020-10-02 DIAGNOSIS — K219 Gastro-esophageal reflux disease without esophagitis: Secondary | ICD-10-CM

## 2020-10-02 DIAGNOSIS — I5020 Unspecified systolic (congestive) heart failure: Secondary | ICD-10-CM

## 2020-10-02 DIAGNOSIS — M069 Rheumatoid arthritis, unspecified: Secondary | ICD-10-CM | POA: Diagnosis not present

## 2020-10-02 DIAGNOSIS — N183 Chronic kidney disease, stage 3 unspecified: Secondary | ICD-10-CM | POA: Diagnosis not present

## 2020-10-02 DIAGNOSIS — Z79899 Other long term (current) drug therapy: Secondary | ICD-10-CM | POA: Diagnosis not present

## 2020-10-02 DIAGNOSIS — Z7982 Long term (current) use of aspirin: Secondary | ICD-10-CM | POA: Diagnosis not present

## 2020-10-02 DIAGNOSIS — R1010 Upper abdominal pain, unspecified: Secondary | ICD-10-CM | POA: Diagnosis not present

## 2020-10-02 DIAGNOSIS — R6881 Early satiety: Secondary | ICD-10-CM | POA: Insufficient documentation

## 2020-10-02 DIAGNOSIS — R5383 Other fatigue: Secondary | ICD-10-CM | POA: Diagnosis not present

## 2020-10-02 DIAGNOSIS — R0602 Shortness of breath: Secondary | ICD-10-CM | POA: Insufficient documentation

## 2020-10-02 LAB — BRAIN NATRIURETIC PEPTIDE: B Natriuretic Peptide: 1385.2 pg/mL — ABNORMAL HIGH (ref 0.0–100.0)

## 2020-10-02 LAB — BASIC METABOLIC PANEL
Anion gap: 13 (ref 5–15)
BUN: 82 mg/dL — ABNORMAL HIGH (ref 8–23)
CO2: 21 mmol/L — ABNORMAL LOW (ref 22–32)
Calcium: 9.8 mg/dL (ref 8.9–10.3)
Chloride: 100 mmol/L (ref 98–111)
Creatinine, Ser: 1.93 mg/dL — ABNORMAL HIGH (ref 0.44–1.00)
GFR, Estimated: 27 mL/min — ABNORMAL LOW (ref >60)
Glucose, Bld: 132 mg/dL — ABNORMAL HIGH (ref 70–99)
Potassium: 4.5 mmol/L (ref 3.5–5.1)
Sodium: 134 mmol/L — ABNORMAL LOW (ref 135–145)

## 2020-10-02 MED ORDER — VERQUVO 10 MG PO TABS: 10.0000 mg | ORAL_TABLET | Freq: Every day | ORAL | 11 refills | Status: DC

## 2020-10-02 MED ORDER — BUMETANIDE 1 MG PO TABS: 2.0000 mg | ORAL_TABLET | Freq: Two times a day (BID) | ORAL | 3 refills | Status: DC

## 2020-10-02 MED ORDER — EMPAGLIFLOZIN 10 MG PO TABS: 10.0000 mg | ORAL_TABLET | Freq: Every day | ORAL | 11 refills | Status: DC

## 2020-10-02 NOTE — Patient Instructions (Addendum)
Labs done today. We will contact you only if your labs are abnormal.  DECREASE Bumex to 2mg  (2 tablets) by mouth 2 times daily.  INCREASE Verquvo to 10mg (1 tablet)  By mouth daily.  START Jardiance 10mg  (1 tablet) by mouth daily.  INCREASE Ensure to 2 times daily.  To inquire about the [facility] please call:[phone]  No other medication changes were made. Please continue all current medications as prescribed.  Your physician recommends that you schedule a follow-up appointment in: 10 days for a lab only appointment, 3 weeks for an appointment with our Clinic Pharmacist, Lauren and in 6 weeks for an appointment with Dr. Aundra Dubin.  You have been referred to [facility]. They will contact you to schedule an appointment.  You have been referred to [facility]. They will contact you to schedule an appointment.  If you have any questions or concerns before your next appointment please send [place] a message through [place] or call our office at [phone].    TO LEAVE A MESSAGE FOR THE NURSE SELECT OPTION 2, PLEASE LEAVE A MESSAGE INCLUDING: . YOUR NAME . DATE OF BIRTH . CALL BACK NUMBER . REASON FOR CALL**this is important as we prioritize the call backs  YOU WILL RECEIVE A CALL BACK THE SAME DAY AS LONG AS YOU CALL BEFORE 4:00 PM   Do the following things EVERYDAY: 1) Weigh yourself in the morning before breakfast. Write it down and keep it in a log. 2) Take your medicines as prescribed 3) Eat low salt foods--Limit salt (sodium) to 2000 mg per day.  4) Stay as active as you can everyday 5) Limit all fluids for the day to less than 2 liters   At the [facility], you and your health needs are our priority. As part of our continuing mission to provide you with exceptional heart care, we have created designated Provider Care Teams. These Care Teams include your primary Cardiologist (physician) and Advanced Practice Providers (APPs- Physician Assistants and Nurse  Practitioners) who all work together to provide you with the care you need, when you need it.   You may see any of the following providers on your designated Care Team at your next follow up: Marland Kitchen Dr Glori Bickers . Dr Loralie Champagne . Darrick Grinder, NP . Lyda Jester, PA . Audry Riles, PharmD   Please be sure to bring in all your medications bottles to every appointment.

## 2020-10-05 NOTE — Progress Notes (Signed)
Cardiology: Dr. Aundra Dubin  75 y.o. with history of rheumatoid arthritis, CKD stage 3, and nonischemic cardiomyopathy was referred by Dr. Andrey Cota in [place] to establish heart failure care in [place].  Patient has been known to have a cardiomyopathy since 2015.  Cardiac MRI in 2015 showed EF 37% with no LGE.  Coronary angiography at that time showed no significant coronary disease.  Over the next few years, LV EF fluctuated up and down.  In 3/21, echo showed EF down to 20-25%.  She was admitted to the hospital in [place], [place] in 11/21 with CHF exacerbation.  Echo showed EF 20-25%, global hypokinesis, mildly decreased RV systolic function.  Creatinine was noted to be elevated. She was diuresed and sent home, but was readmitted later in 11/21 with ongoing volume overload. Creatinine was up to 2.89.  She was taken off most of her cardiac meds due to soft BP and elevated creatinine. She was discharged to her daughter's house in [place].   I repeated an echo on her in 12/21, EF < 20% with moderate LV dilation and mildly decreased RV systolic function. RHC was done, surprisingly showing low filling pressures and relatively preserved cardiac index at 2.22.   Patient presents today with her son.  Since her last appointment, she had COVID-19.  She feels like she has recovered from this (cough, fever).  Appetite remains poor.  Weight is down 5 lbs.  She uses her walker due to poor balance.  She is short of breath and fatigued walking across her house.  No chest pain.  She sleeps on 2 pillows chronically.    Labs (12/21): K 5 => 3.7, creatinine 2.89 => 1.9 => 1.69, BNP 2904 Labs (1/22): K 4, creatinine 1.9, hgb 12.5  PMH: 1. GERD 2. Rheumatoid arthritis: Followed by rheumatology in [place].  - No known pulmonary disease from RA.  3. Osteoporosis. 4. Gout 5. CKD stage 3 6. H/o SVT 7. Chronic systolic CHF: She was found to have nonischemic cardiomyopathy with low EF in 2015.  - LHC (2015): Normal  coronaries.  - cardiac MRI (2015): EF 37%, no LGE - Echo (3/21): EF 20-25%. - Echo (11/21): EF 20-25%, moderate LV dilation, global hypokinesis, biatrial enlargement, mildly decreased RV systolic function.  - Echo (12/21): EF < 20%, moderate LV dilation, normal RV size/systolic function, moderate MR, IVC normal.  - RHC (1/22): Mean RA 1, PA 39/14, mean PCWP 10, PVR 3.9 WU, CI 2.22.   SH: From [place] but recently moved to [place] to live with daughter.  Has 3 children.  Nonsmoker.  No ETOH.   FH: Mother with CHF, father with MI.   ROS: All systems reviewed and negative except as per HPI.   Current Outpatient Medications  Medication Sig Dispense Refill  . acetaminophen (TYLENOL) 500 MG tablet Take 500-1,000 mg by mouth every 6 (six) hours as needed for moderate pain.    Marland Kitchen aspirin EC 81 MG tablet Take 1 tablet (81 mg total) by mouth daily. Swallow whole. 90 tablet 3  . azaTHIOprine (IMURAN) 50 MG tablet Take 1 tablet (50 mg total) by mouth daily. 30 tablet 3  . cholestyramine (QUESTRAN) 4 g packet Take 1 packet (4 g total) by mouth daily. 60 each 12  . cycloSPORINE (RESTASIS) 0.05 % ophthalmic emulsion Place 1 drop into both eyes 2 (two) times daily. 1.5 mL 3  . empagliflozin (JARDIANCE) 10 MG TABS tablet Take 1 tablet (10 mg total) by mouth daily before breakfast. 30 tablet 11  .  Ensure (ENSURE) Take 237 mLs by mouth daily.    . ergocalciferol (VITAMIN D2) 1.25 MG (50000 UT) capsule Take 1 capsule (50,000 Units total) by mouth once a week. 10 capsule 3  . folic acid (FOLVITE) 1 MG tablet Take 1 tablet (1 mg total) by mouth daily. 30 tablet 3  . hydroxychloroquine (PLAQUENIL) 200 MG tablet Take 1 tablet (200 mg total) by mouth daily. 30 tablet 3  . ibandronate (BONIVA) 150 MG tablet Take 150 mg by mouth every 30 (thirty) days. Take in the morning with a full glass of water, on an empty stomach, and do not take anything else by mouth or lie down for the next 30 min.    . Menthol, Topical  Analgesic, (ICY HOT BACK EX) Apply 1 application topically daily as needed (pain).    . metoprolol succinate (TOPROL XL) 50 MG 24 hr tablet Take 1 tablet (50 mg total) by mouth daily. Take with or immediately following a meal. 90 tablet 3  . MITIGARE 0.6 MG CAPS Take 0.6 mg by mouth daily as needed (gout).    . Multiple Vitamin (MULTIVITAMIN) tablet Take 1 tablet by mouth daily.    Marland Kitchen omeprazole (PRILOSEC) 40 MG capsule Take 40 mg by mouth daily.    . polyethylene glycol (MIRALAX / GLYCOLAX) 17 g packet Take 17 g by mouth as needed.    . predniSONE (DELTASONE) 5 MG tablet Take 1 tablet (5 mg total) by mouth daily with breakfast. 30 tablet 3  . spironolactone (ALDACTONE) 25 MG tablet Take 1 tablet (25 mg total) by mouth daily. 90 tablet 3  . Vericiguat (VERQUVO) 10 MG TABS Take 10 mg by mouth daily. 30 tablet 11  . bumetanide (BUMEX) 1 MG tablet Take 2 tablets (2 mg total) by mouth 2 (two) times daily. 180 tablet 3   No current facility-administered medications for this encounter.   BP 112/70   Pulse 86   Wt 46.5 kg (102 lb 9.6 oz)   SpO2 99%   BMI 17.61 kg/m  General: NAD, thin Neck: No JVD, no thyromegaly or thyroid nodule.  Lungs: Clear to auscultation bilaterally with normal respiratory effort. CV: Nondisplaced PMI.  Heart regular S1/S2, no S3/S4, no murmur.  No peripheral edema.  No carotid bruit.  Normal pedal pulses.  Abdomen: Soft, nontender, no hepatosplenomegaly, no distention.  Skin: Intact without lesions or rashes.  Neurologic: Alert and oriented x 3.  Psych: Normal affect. Extremities: No clubbing or cyanosis.  HEENT: Normal.   Assessment/Plan: 1. Chronic systolic CHF: Nonischemic cardiomyopathy.  This has been known since 2015, cath in 2015 showed no coronary disease and cardiac MRI in 2015 showed no LGE.  Cause is uncertain, familial cardiomyopathy is a concern given nonischemic cardiomyopathy in her mother.  Cannot rule out remote viral myocarditis. She seems quite  limited, primarily by fatigue, NYHA class IIIb.  Poor appetite and weight loss is concerning, as is elevated creatinine (suspect cardiorenal syndrome).  Low BP and elevated creatinine have limited her cardiac med regimen. Most recent echo in 12/21 showed EF < 20% with normal RV systolic function. RHC (1/22) showed low filling pressures and actually a relatively preserved cardiac index of 2.22.  She is not volume overloaded on exam today.  - Start Farxiga 10 mg daily and decrease bumetanide to 2 mg bid. BMET today and again in 10 days.  - Continue Toprol XL 50 mg daily.  - Increase Verquvo to 10 mg daily.  - Continue spironolactone 25 mg  daily.  - I mentioned [facility] gene testing for possible familial cardiomyopathy. She will think about this.  - Though RHC was not markedly abnormal, I am worried that she is nearing the time for consideration of advanced therapies.  I will arrange for a CPX to assess her functional capacity and for prognostic purposes.  - I will refer her to cardiac rehab.  2. CKD: Stage 3.   - BMET today.  - Will make sure she has referral to nephrology.  3. Rheumatoid arthritis: No history of lung involvement. She has been on a low dose of prednisone chronically.  - Will make sure she has referral to Dr. Amil Amen to establish care for RA.  4. GI discomfort: Band of pain across upper abdomen, worse after meals.  Early satiety.  It is possible that symptoms may be due to heart failure itself, but she would like GI evaluation which I think is reasonable.  - I have referred to GI.   Followup in 3 wks with HF pharmacist for med titration and in 6 wks with me.   Loralie Champagne 10/05/2020

## 2020-10-08 ENCOUNTER — Other Ambulatory Visit: Payer: Self-pay

## 2020-10-08 ENCOUNTER — Ambulatory Visit (HOSPITAL_COMMUNITY): Payer: Medicare PPO | Attending: Cardiology

## 2020-10-08 DIAGNOSIS — I509 Heart failure, unspecified: Secondary | ICD-10-CM | POA: Diagnosis not present

## 2020-10-08 DIAGNOSIS — I5020 Unspecified systolic (congestive) heart failure: Secondary | ICD-10-CM | POA: Insufficient documentation

## 2020-10-14 ENCOUNTER — Ambulatory Visit (HOSPITAL_COMMUNITY)
Admission: RE | Admit: 2020-10-14 | Discharge: 2020-10-14 | Disposition: A | Discharge status: Home / Self Care | Payer: Medicare PPO | Source: Ambulatory Visit | Attending: Cardiology | Admitting: Cardiology

## 2020-10-14 ENCOUNTER — Other Ambulatory Visit: Payer: Self-pay

## 2020-10-14 DIAGNOSIS — I5020 Unspecified systolic (congestive) heart failure: Secondary | ICD-10-CM | POA: Insufficient documentation

## 2020-10-14 LAB — BASIC METABOLIC PANEL
Anion gap: 12 (ref 5–15)
BUN: 67 mg/dL — ABNORMAL HIGH (ref 8–23)
CO2: 24 mmol/L (ref 22–32)
Calcium: 9.7 mg/dL (ref 8.9–10.3)
Chloride: 94 mmol/L — ABNORMAL LOW (ref 98–111)
Creatinine, Ser: 2.52 mg/dL — ABNORMAL HIGH (ref 0.44–1.00)
GFR, Estimated: 19 mL/min — ABNORMAL LOW (ref >60)
Glucose, Bld: 113 mg/dL — ABNORMAL HIGH (ref 70–99)
Potassium: 4.8 mmol/L (ref 3.5–5.1)
Sodium: 130 mmol/L — ABNORMAL LOW (ref 135–145)

## 2020-10-15 ENCOUNTER — Telehealth (HOSPITAL_COMMUNITY): Payer: Self-pay | Admitting: Surgery

## 2020-10-15 MED ORDER — BUMETANIDE 1 MG PO TABS: ORAL_TABLET | ORAL | 3 refills | Status: DC

## 2020-10-15 NOTE — Telephone Encounter (Signed)
I spoke with patient regarding her recent lab work and order to decrease dosage beginning tomorrow after holding Bumex today (has not yet taken dose today).  She is aware and agreeable.  Med list updated with appropriate dosage change.

## 2020-10-17 ENCOUNTER — Telehealth (HOSPITAL_COMMUNITY): Payer: Self-pay

## 2020-10-17 NOTE — Telephone Encounter (Signed)
Late entry: patients referral to [facility] was successfully faxed on 10/10/2020. A copy will be scanned into the patients chart.

## 2020-10-18 ENCOUNTER — Telehealth (HOSPITAL_COMMUNITY): Payer: Self-pay | Admitting: *Deleted

## 2020-10-18 ENCOUNTER — Encounter (HOSPITAL_COMMUNITY): Payer: Self-pay | Admitting: Emergency Medicine

## 2020-10-18 ENCOUNTER — Other Ambulatory Visit: Payer: Self-pay

## 2020-10-18 ENCOUNTER — Inpatient Hospital Stay (HOSPITAL_COMMUNITY)
Admission: EM | Admit: 2020-10-18 | Discharge: 2020-10-22 | DRG: 309 | Disposition: A | Discharge status: Home Health | Payer: Medicare PPO | Attending: Cardiology | Admitting: Cardiology

## 2020-10-18 ENCOUNTER — Emergency Department (HOSPITAL_COMMUNITY): Payer: Medicare PPO

## 2020-10-18 DIAGNOSIS — M069 Rheumatoid arthritis, unspecified: Secondary | ICD-10-CM | POA: Diagnosis present

## 2020-10-18 DIAGNOSIS — Z7982 Long term (current) use of aspirin: Secondary | ICD-10-CM

## 2020-10-18 DIAGNOSIS — I130 Hypertensive heart and chronic kidney disease with heart failure and stage 1 through stage 4 chronic kidney disease, or unspecified chronic kidney disease: Secondary | ICD-10-CM | POA: Diagnosis present

## 2020-10-18 DIAGNOSIS — I5023 Acute on chronic systolic (congestive) heart failure: Secondary | ICD-10-CM | POA: Diagnosis not present

## 2020-10-18 DIAGNOSIS — Z79899 Other long term (current) drug therapy: Secondary | ICD-10-CM | POA: Diagnosis not present

## 2020-10-18 DIAGNOSIS — Z7984 Long term (current) use of oral hypoglycemic drugs: Secondary | ICD-10-CM

## 2020-10-18 DIAGNOSIS — Z681 Body mass index (BMI) 19 or less, adult: Secondary | ICD-10-CM

## 2020-10-18 DIAGNOSIS — I4892 Unspecified atrial flutter: Principal | ICD-10-CM | POA: Diagnosis present

## 2020-10-18 DIAGNOSIS — I428 Other cardiomyopathies: Secondary | ICD-10-CM | POA: Diagnosis not present

## 2020-10-18 DIAGNOSIS — N1832 Chronic kidney disease, stage 3b: Secondary | ICD-10-CM | POA: Diagnosis present

## 2020-10-18 DIAGNOSIS — I959 Hypotension, unspecified: Secondary | ICD-10-CM

## 2020-10-18 DIAGNOSIS — I420 Dilated cardiomyopathy: Secondary | ICD-10-CM | POA: Diagnosis present

## 2020-10-18 DIAGNOSIS — Z20822 Contact with and (suspected) exposure to covid-19: Secondary | ICD-10-CM | POA: Diagnosis present

## 2020-10-18 DIAGNOSIS — I5022 Chronic systolic (congestive) heart failure: Secondary | ICD-10-CM | POA: Diagnosis present

## 2020-10-18 DIAGNOSIS — Z885 Allergy status to narcotic agent status: Secondary | ICD-10-CM

## 2020-10-18 DIAGNOSIS — Z8249 Family history of ischemic heart disease and other diseases of the circulatory system: Secondary | ICD-10-CM

## 2020-10-18 DIAGNOSIS — R112 Nausea with vomiting, unspecified: Secondary | ICD-10-CM

## 2020-10-18 DIAGNOSIS — R634 Abnormal weight loss: Secondary | ICD-10-CM | POA: Diagnosis present

## 2020-10-18 DIAGNOSIS — N183 Chronic kidney disease, stage 3 unspecified: Secondary | ICD-10-CM

## 2020-10-18 DIAGNOSIS — I493 Ventricular premature depolarization: Secondary | ICD-10-CM | POA: Diagnosis present

## 2020-10-18 DIAGNOSIS — E1122 Type 2 diabetes mellitus with diabetic chronic kidney disease: Secondary | ICD-10-CM | POA: Diagnosis present

## 2020-10-18 DIAGNOSIS — R5381 Other malaise: Secondary | ICD-10-CM

## 2020-10-18 DIAGNOSIS — Z7952 Long term (current) use of systemic steroids: Secondary | ICD-10-CM

## 2020-10-18 DIAGNOSIS — R0789 Other chest pain: Secondary | ICD-10-CM

## 2020-10-18 DIAGNOSIS — I5043 Acute on chronic combined systolic (congestive) and diastolic (congestive) heart failure: Secondary | ICD-10-CM | POA: Diagnosis not present

## 2020-10-18 DIAGNOSIS — I5084 End stage heart failure: Secondary | ICD-10-CM | POA: Diagnosis present

## 2020-10-18 DIAGNOSIS — M810 Age-related osteoporosis without current pathological fracture: Secondary | ICD-10-CM | POA: Diagnosis present

## 2020-10-18 DIAGNOSIS — R54 Age-related physical debility: Secondary | ICD-10-CM | POA: Diagnosis present

## 2020-10-18 DIAGNOSIS — Z888 Allergy status to other drugs, medicaments and biological substances status: Secondary | ICD-10-CM | POA: Diagnosis not present

## 2020-10-18 DIAGNOSIS — K219 Gastro-esophageal reflux disease without esophagitis: Secondary | ICD-10-CM

## 2020-10-18 DIAGNOSIS — I5020 Unspecified systolic (congestive) heart failure: Secondary | ICD-10-CM | POA: Diagnosis present

## 2020-10-18 DIAGNOSIS — M0579 Rheumatoid arthritis with rheumatoid factor of multiple sites without organ or systems involvement: Secondary | ICD-10-CM | POA: Diagnosis present

## 2020-10-18 LAB — BASIC METABOLIC PANEL
Anion gap: 12 (ref 5–15)
BUN: 72 mg/dL — ABNORMAL HIGH (ref 8–23)
CO2: 22 mmol/L (ref 22–32)
Calcium: 8.9 mg/dL (ref 8.9–10.3)
Chloride: 95 mmol/L — ABNORMAL LOW (ref 98–111)
Creatinine, Ser: 2.44 mg/dL — ABNORMAL HIGH (ref 0.44–1.00)
GFR, Estimated: 20 mL/min — ABNORMAL LOW (ref >60)
Glucose, Bld: 99 mg/dL (ref 70–99)
Potassium: 5.1 mmol/L (ref 3.5–5.1)
Sodium: 129 mmol/L — ABNORMAL LOW (ref 135–145)

## 2020-10-18 LAB — CBC
HCT: 38 % (ref 36.0–46.0)
Hemoglobin: 12.2 g/dL (ref 12.0–15.0)
MCH: 28.2 pg (ref 26.0–34.0)
MCHC: 32.1 g/dL (ref 30.0–36.0)
MCV: 88 fL (ref 80.0–100.0)
Platelets: 294 10*3/uL (ref 150–400)
RBC: 4.32 MIL/uL (ref 3.87–5.11)
RDW: 19.4 % — ABNORMAL HIGH (ref 11.5–15.5)
WBC: 9.6 10*3/uL (ref 4.0–10.5)
nRBC: 0 % (ref 0.0–0.2)

## 2020-10-18 LAB — BRAIN NATRIURETIC PEPTIDE: B Natriuretic Peptide: 1406.2 pg/mL — ABNORMAL HIGH (ref 0.0–100.0)

## 2020-10-18 MED ORDER — AZATHIOPRINE 50 MG PO TABS
50.0000 mg | ORAL_TABLET | Freq: Every day | ORAL | Status: DC
  Administered 2020-10-19 – 2020-10-22 (×4): 50 mg via ORAL
  Filled 2020-10-18 (×4): qty 1

## 2020-10-18 MED ORDER — EMPAGLIFLOZIN 10 MG PO TABS
10.0000 mg | ORAL_TABLET | Freq: Every day | ORAL | Status: DC
  Administered 2020-10-19: 10 mg via ORAL
  Filled 2020-10-18: qty 1

## 2020-10-18 MED ORDER — VERICIGUAT 10 MG PO TABS
10.0000 mg | ORAL_TABLET | Freq: Every day | ORAL | Status: DC
  Administered 2020-10-22: 10 mg via ORAL
  Filled 2020-10-18 (×3): qty 10

## 2020-10-18 MED ORDER — ONDANSETRON HCL 4 MG/2ML IJ SOLN: 4.0000 mg | Freq: Four times a day (QID) | INTRAMUSCULAR | Status: DC | PRN

## 2020-10-18 MED ORDER — ACETAMINOPHEN 325 MG PO TABS: 650.0000 mg | ORAL_TABLET | ORAL | Status: DC | PRN

## 2020-10-18 MED ORDER — CHOLESTYRAMINE 4 G PO PACK
4.0000 g | PACK | ORAL | Status: DC
  Administered 2020-10-19 – 2020-10-20 (×2): 4 g via ORAL
  Filled 2020-10-18 (×4): qty 1

## 2020-10-18 MED ORDER — METOPROLOL TARTRATE 25 MG PO TABS
12.5000 mg | ORAL_TABLET | Freq: Four times a day (QID) | ORAL | Status: DC
  Administered 2020-10-19 (×2): 12.5 mg via ORAL
  Filled 2020-10-18 (×2): qty 1

## 2020-10-18 MED ORDER — CYCLOSPORINE 0.05 % OP EMUL
1.0000 [drp] | Freq: Two times a day (BID) | OPHTHALMIC | Status: DC
  Administered 2020-10-19 – 2020-10-21 (×6): 1 [drp] via OPHTHALMIC
  Filled 2020-10-18 (×9): qty 1

## 2020-10-18 MED ORDER — BUMETANIDE 1 MG PO TABS
1.0000 mg | ORAL_TABLET | Freq: Two times a day (BID) | ORAL | Status: DC
  Filled 2020-10-18: qty 1

## 2020-10-18 MED ORDER — FOLIC ACID 1 MG PO TABS
1.0000 mg | ORAL_TABLET | Freq: Every day | ORAL | Status: DC
  Administered 2020-10-19 – 2020-10-22 (×4): 1 mg via ORAL
  Filled 2020-10-18 (×4): qty 1

## 2020-10-18 MED ORDER — SPIRONOLACTONE 25 MG PO TABS
25.0000 mg | ORAL_TABLET | Freq: Every day | ORAL | Status: DC
  Filled 2020-10-18: qty 1

## 2020-10-18 MED ORDER — LACTATED RINGERS IV BOLUS
250.0000 mL | Freq: Once | INTRAVENOUS | Status: AC
  Administered 2020-10-18: 250 mL via INTRAVENOUS

## 2020-10-18 MED ORDER — APIXABAN 2.5 MG PO TABS
2.5000 mg | ORAL_TABLET | Freq: Two times a day (BID) | ORAL | Status: DC
  Administered 2020-10-19 – 2020-10-22 (×8): 2.5 mg via ORAL
  Filled 2020-10-18 (×9): qty 1

## 2020-10-18 MED ORDER — PREDNISONE 5 MG PO TABS
5.0000 mg | ORAL_TABLET | Freq: Every day | ORAL | Status: DC
  Administered 2020-10-19 – 2020-10-22 (×4): 5 mg via ORAL
  Filled 2020-10-18 (×4): qty 1

## 2020-10-18 MED ORDER — HYDROXYCHLOROQUINE SULFATE 200 MG PO TABS
200.0000 mg | ORAL_TABLET | Freq: Every day | ORAL | Status: DC
  Administered 2020-10-19 – 2020-10-22 (×4): 200 mg via ORAL
  Filled 2020-10-18 (×4): qty 1

## 2020-10-18 MED ORDER — PANTOPRAZOLE SODIUM 40 MG PO TBEC
40.0000 mg | DELAYED_RELEASE_TABLET | Freq: Every day | ORAL | Status: DC
  Administered 2020-10-19 – 2020-10-22 (×4): 40 mg via ORAL
  Filled 2020-10-18 (×4): qty 1

## 2020-10-18 MED ORDER — LACTATED RINGERS IV BOLUS: 250.0000 mL | Freq: Once | INTRAVENOUS | Status: DC

## 2020-10-18 MED ORDER — ASPIRIN EC 81 MG PO TBEC: 81.0000 mg | DELAYED_RELEASE_TABLET | Freq: Every day | ORAL | Status: DC

## 2020-10-18 NOTE — ED Notes (Signed)
Md notified of pts soft blood pressure

## 2020-10-18 NOTE — ED Notes (Signed)
Pt still not responding to fluid boluses pts bps are still soft

## 2020-10-18 NOTE — ED Triage Notes (Signed)
Patient sent to ED after calling PCP about blood pressure in 60's measured at home. Patient BP in triage 77/63. Patient drowsy but alert, oriented, and in no apparent distress at this time.

## 2020-10-18 NOTE — Telephone Encounter (Signed)
Pt called office back and told me she was waiting with her daughter on her son in law to take her to [facility] emergency room right now. Pt said she was short of breath, still a little dizzy, and had some tingling in her chest. Pt again declined ems and will come to the hospital via private vehicle (son in law).

## 2020-10-18 NOTE — Telephone Encounter (Signed)
Received VM from [facility] stating pt was weak and pt bp was 62/44 after drinking water bp was 68/42. She asked that I call the patient back. I called the patient directly and patient stated she felt very weak and tired I told pt that per Dr.McLean she needed to go to the emergency room. Pt said she did not have a way to get there. I asked patient if she would like me to call EMS pt declined and said she would like to call her daughter first to see if she could drive her. I asked pt to please call me back and let me know she is going to the emergency room. I called pt back to follow up pt said she was still waiting to hear back from her family and did not want to take an ems right now. Pt took her bp while on the phone with me it was 88/56 pt still felt dizzy and said she was drinking water. Pt said she wanted to give her family until 4pm. Pt to call me back at 4pm.

## 2020-10-18 NOTE — ED Notes (Signed)
Md notified of pts soft bp

## 2020-10-18 NOTE — ED Provider Notes (Signed)
[facility] EMERGENCY DEPARTMENT Provider Note   CSN: [id number] Arrival date & time: 10/18/20  1756     History Chief Complaint  Patient presents with  . Hypotension    Tracy Nelson is a 75 y.o. female.  HPI Patient presents with low blood pressure.  Blood pressures low at home.  States had been 60/40.  States normally her blood pressure runs low but not that low.  Sounds as if her systolics are around 90 diastolics above 60.  Has a history of dilated cardiomyopathy and heart failure.  Has had decrease in medications recently due to hypotension.  No chest pain.  States she feels fatigued.  States she had a recent decrease in her Bumex.  No fevers or chills. Patient also has a tachycardia.  Unsure exactly when this started but states it has been on and off for a while now.  No history of atrial fibrillation or atrial flutter.    Past Medical History:  Diagnosis Date  . Acid reflux 07/15/2020  . Arthritis 07/15/2020  . CHF (congestive heart failure) ([place])   . Colitis 07/15/2020  . Dilated cardiomyopathy ([place]) 07/15/2020  . HFrEF (heart failure with reduced ejection fraction) ([place]) 07/15/2020  . HTN (hypertension) 07/15/2020  . Osteoporosis 07/15/2020  . Palpitations 07/15/2020  . Rheumatoid arthritis ([place]) 07/15/2020    Patient Active Problem List   Diagnosis Date Noted  . Arthritis 07/15/2020  . Acid reflux 07/15/2020  . Colitis 07/15/2020  . Dilated cardiomyopathy ([place]) 07/15/2020  . HFrEF (heart failure with reduced ejection fraction) ([place]) 07/15/2020  . HTN (hypertension) 07/15/2020  . Osteoporosis 07/15/2020  . Palpitations 07/15/2020  . Rheumatoid arthritis ([place]) 07/15/2020    Past Surgical History:  Procedure Laterality Date  . RIGHT HEART CATH N/A 08/14/2020   Procedure: RIGHT HEART CATH;  Surgeon: [name], MD;  Location: [facility];  Service: Cardiovascular;  Laterality: N/A;     OB History   No obstetric history on file.      No family history on file.  Social History   Tobacco Use  . Smoking status: Never Smoker  . Smokeless tobacco: Never Used  Substance Use Topics  . Alcohol use: Never  . Drug use: Never    Home Medications Prior to Admission medications   Medication Sig Start Date End Date Taking? Authorizing Provider  acetaminophen (TYLENOL) 500 MG tablet Take 500-1,000 mg by mouth every 6 (six) hours as needed for moderate pain.   Yes [provider]  aspirin EC 81 MG tablet Take 1 tablet (81 mg total) by mouth daily. Swallow whole. 08/08/20  Yes [name], MD  azaTHIOprine (IMURAN) 50 MG tablet Take 1 tablet (50 mg total) by mouth daily. 08/08/20  Yes [name], MD  bumetanide (BUMEX) 1 MG tablet Take 2 mg in AM and 1 mg in PM 10/15/20  Yes [name], MD  cholestyramine Lucrezia Starch) 4 g packet Take 1 packet (4 g total) by mouth daily. 08/08/20  Yes [name], MD  cycloSPORINE (RESTASIS) 0.05 % ophthalmic emulsion Place 1 drop into both eyes 2 (two) times daily. 08/23/20  Yes [name], MD  empagliflozin (JARDIANCE) 10 MG TABS tablet Take 1 tablet (10 mg total) by mouth daily before breakfast. 10/02/20  Yes [name], MD  Ensure (ENSURE) Take 237 mLs by mouth daily.   Yes [provider]  ergocalciferol (VITAMIN D2) 1.25 MG (50000 UT) capsule Take 1 capsule (50,000  Units total) by mouth once a week. 08/08/20  Yes [name], MD  folic acid (FOLVITE) 1 MG tablet Take 1 tablet (1 mg total) by mouth daily. 08/08/20  Yes [name], MD  hydroxychloroquine (PLAQUENIL) 200 MG tablet Take 1 tablet (200 mg total) by mouth daily. 08/08/20  Yes [name], MD  ibandronate (BONIVA) 150 MG tablet Take 150 mg by mouth every 30 (thirty) days. Take in the morning with a full glass of water, on an empty stomach, and do not take anything else by mouth or lie down for the next 30 min.   Yes [provider]  Menthol, Topical Analgesic,  (ICY HOT BACK EX) Apply 1 application topically daily as needed (pain).   Yes [provider]  metoprolol succinate (TOPROL XL) 50 MG 24 hr tablet Take 1 tablet (50 mg total) by mouth daily. Take with or immediately following a meal. 08/20/20  Yes [name], MD  MITIGARE 0.6 MG CAPS Take 0.6 mg by mouth daily as needed (gout). 07/18/20  Yes [provider]  Multiple Vitamin (MULTIVITAMIN) tablet Take 1 tablet by mouth daily.   Yes [provider]  omeprazole (PRILOSEC) 40 MG capsule Take 40 mg by mouth daily.   Yes [provider]  polyethylene glycol (MIRALAX / GLYCOLAX) 17 g packet Take 17 g by mouth as needed.   Yes [provider]  predniSONE (DELTASONE) 5 MG tablet Take 1 tablet (5 mg total) by mouth daily with breakfast. 08/08/20  Yes [name], MD  spironolactone (ALDACTONE) 25 MG tablet Take 1 tablet (25 mg total) by mouth daily. 09/04/20  Yes [name], MD  Vericiguat (VERQUVO) 10 MG TABS Take 10 mg by mouth daily. 10/02/20  Yes [name], MD    Allergies    Baclofen and Penicillins  Review of Systems   Review of Systems  Constitutional: Positive for appetite change and fatigue.  HENT: Negative for congestion.   Respiratory: Positive for shortness of breath.   Cardiovascular: Positive for chest pain.  Gastrointestinal: Negative for abdominal pain.  Genitourinary: Negative for flank pain.  Musculoskeletal: Negative for back pain.  Skin: Negative for rash.  Neurological: Positive for light-headedness.  Psychiatric/Behavioral: Negative for confusion.    Physical Exam Updated Vital Signs BP 95/75   Pulse (!) 119   Temp 99 F (37.2 C) (Oral)   Resp 17   SpO2 99%   Physical Exam Vitals and nursing note reviewed.  HENT:     Head: Atraumatic.     Mouth/Throat:     Mouth: Mucous membranes are moist.  Eyes:     Pupils: Pupils are equal, round, and reactive to light.  Cardiovascular:     Rate and Rhythm:  Tachycardia present. Rhythm irregular.  Pulmonary:     Breath sounds: No wheezing or rhonchi.  Abdominal:     Tenderness: There is no abdominal tenderness.  Musculoskeletal:        General: No tenderness.     Cervical back: Neck supple.     Right lower leg: No edema.     Left lower leg: No edema.  Skin:    General: Skin is warm.     Capillary Refill: Capillary refill takes less than 2 seconds.  Neurological:     Mental Status: She is alert and oriented to person, place, and time.     ED Results / Procedures / Treatments   Labs (all labs ordered are listed, but  only abnormal results are displayed) Labs Reviewed  CBC - Abnormal; Notable for the following components:      Result Value   RDW 19.4 (*)    All other components within normal limits  BRAIN NATRIURETIC PEPTIDE - Abnormal; Notable for the following components:   B Natriuretic Peptide 1,406.2 (*)    All other components within normal limits  BASIC METABOLIC PANEL - Abnormal; Notable for the following components:   Sodium 129 (*)    Chloride 95 (*)    BUN 72 (*)    Creatinine, Ser 2.44 (*)    GFR, Estimated 20 (*)    All other components within normal limits  HEPATIC FUNCTION PANEL    EKG EKG Interpretation  Date/Time:  Friday October 18 2020 18:07:11 EST Ventricular Rate:  118 PR Interval:  154 QRS Duration: 94 QT Interval:  350 QTC Calculation: 490 R Axis:   -56 Text Interpretation: atrial flutter Left axis deviation Minimal voltage criteria for LVH, may be normal variant ( Cornell product ) Anteroseptal infarct , age undetermined ST & T wave abnormality, consider lateral ischemia Abnormal ECG Confirmed by Davonna Belling [id number]) on 10/18/2020 6:31:53 PM   Radiology DG Chest Portable 1 View  Result Date: 10/18/2020 CLINICAL DATA:  Shortness of breath and hypotension. EXAM: PORTABLE CHEST 1 VIEW COMPARISON:  Radiograph 08/29/2020 FINDINGS: Cardiac enlargement. Known dilated cardiomyopathy. The mediastinal  and hilar contours are within normal limits. The lungs are clear. No pulmonary edema, pleural effusions or pulmonary infiltrates. Streaky left basilar scarring changes or atelectasis. No pulmonary lesions. The bony thorax is intact. IMPRESSION: 1. Cardiac enlargement. 2. No acute cardiopulmonary findings. Electronically Signed   By: Marijo Sanes M.D.   On: 10/18/2020 19:15    Procedures Procedures   Medications Ordered in ED Medications  lactated ringers bolus 250 mL (0 mLs Intravenous Stopped 10/18/20 1903)  lactated ringers bolus 250 mL (0 mLs Intravenous Stopped 10/18/20 2115)  lactated ringers bolus 250 mL (0 mLs Intravenous Stopped 10/18/20 2309)    ED Course  I have reviewed the triage vital signs and the nursing notes.  Pertinent labs & imaging results that were available during my care of the patient were reviewed by me and considered in my medical decision making (see chart for details).    MDM Rules/Calculators/A&P                          Patient presents with hypotension.  History of same.  Has been having adjustment of medications due to hypotension due to poor end-stage heart failure.  However was found to be in new onset atrial flutter also with a mild RVR.  Doubt sepsis. Chadsvasc 5.  Fluid boluses given x3 but just small doses due to her heart failure.  Will admit to cardiology.  Creatinine is somewhat elevated but appears to be at her recent baseline.  BNP also elevated but similar or slightly decreased from prior.  CRITICAL CARE Performed by: Davonna Belling Total critical care time: 30 minutes Critical care time was exclusive of separately billable procedures and treating other patients. Critical care was necessary to treat or prevent imminent or life-threatening deterioration. Critical care was time spent personally by me on the following activities: development of treatment plan with patient and/or surrogate as well as nursing, discussions with consultants,  evaluation of patient's response to treatment, examination of patient, obtaining history from patient or surrogate, ordering and performing treatments and interventions, ordering and review of  laboratory studies, ordering and review of radiographic studies, pulse oximetry and re-evaluation of patient's condition.  Final Clinical Impression(s) / ED Diagnoses Final diagnoses:  Atrial flutter with rapid ventricular response ([place])  Hypotension, unspecified hypotension type    Rx / DC Orders ED Discharge Orders    None       Davonna Belling, MD 10/18/20 2332

## 2020-10-19 ENCOUNTER — Encounter (HOSPITAL_COMMUNITY): Payer: Self-pay | Admitting: Internal Medicine

## 2020-10-19 ENCOUNTER — Other Ambulatory Visit: Payer: Self-pay

## 2020-10-19 DIAGNOSIS — I4892 Unspecified atrial flutter: Principal | ICD-10-CM

## 2020-10-19 DIAGNOSIS — I959 Hypotension, unspecified: Secondary | ICD-10-CM

## 2020-10-19 LAB — HEPATIC FUNCTION PANEL
ALT: 13 U/L (ref 0–44)
AST: 27 U/L (ref 15–41)
Albumin: 2.9 g/dL — ABNORMAL LOW (ref 3.5–5.0)
Alkaline Phosphatase: 55 U/L (ref 38–126)
Bilirubin, Direct: 0.2 mg/dL (ref 0.0–0.2)
Indirect Bilirubin: 0.5 mg/dL (ref 0.3–0.9)
Total Bilirubin: 0.7 mg/dL (ref 0.3–1.2)
Total Protein: 6 g/dL — ABNORMAL LOW (ref 6.5–8.1)

## 2020-10-19 LAB — BASIC METABOLIC PANEL
Anion gap: 10 (ref 5–15)
BUN: 73 mg/dL — ABNORMAL HIGH (ref 8–23)
CO2: 24 mmol/L (ref 22–32)
Calcium: 9.1 mg/dL (ref 8.9–10.3)
Chloride: 96 mmol/L — ABNORMAL LOW (ref 98–111)
Creatinine, Ser: 2.39 mg/dL — ABNORMAL HIGH (ref 0.44–1.00)
GFR, Estimated: 21 mL/min — ABNORMAL LOW (ref >60)
Glucose, Bld: 104 mg/dL — ABNORMAL HIGH (ref 70–99)
Potassium: 3.9 mmol/L (ref 3.5–5.1)
Sodium: 130 mmol/L — ABNORMAL LOW (ref 135–145)

## 2020-10-19 LAB — CBC
HCT: 33 % — ABNORMAL LOW (ref 36.0–46.0)
Hemoglobin: 11.1 g/dL — ABNORMAL LOW (ref 12.0–15.0)
MCH: 29.1 pg (ref 26.0–34.0)
MCHC: 33.6 g/dL (ref 30.0–36.0)
MCV: 86.6 fL (ref 80.0–100.0)
Platelets: 246 10*3/uL (ref 150–400)
RBC: 3.81 MIL/uL — ABNORMAL LOW (ref 3.87–5.11)
RDW: 19.2 % — ABNORMAL HIGH (ref 11.5–15.5)
WBC: 8 10*3/uL (ref 4.0–10.5)
nRBC: 0 % (ref 0.0–0.2)

## 2020-10-19 LAB — PROTIME-INR
INR: 1 (ref 0.8–1.2)
Prothrombin Time: 13 seconds (ref 11.4–15.2)

## 2020-10-19 LAB — SARS CORONAVIRUS 2 (TAT 6-24 HRS): SARS Coronavirus 2: NEGATIVE

## 2020-10-19 LAB — LACTIC ACID, PLASMA
Lactic Acid, Venous: 1.3 mmol/L (ref 0.5–1.9)
Lactic Acid, Venous: 1.8 mmol/L (ref 0.5–1.9)

## 2020-10-19 MED ORDER — AMIODARONE LOAD VIA INFUSION
150.0000 mg | Freq: Once | INTRAVENOUS | Status: AC
  Administered 2020-10-19: 150 mg via INTRAVENOUS
  Filled 2020-10-19: qty 83.34

## 2020-10-19 MED ORDER — AMIODARONE HCL IN DEXTROSE 360-4.14 MG/200ML-% IV SOLN
60.0000 mg/h | INTRAVENOUS | Status: DC
  Administered 2020-10-19 (×2): 60 mg/h via INTRAVENOUS
  Filled 2020-10-19 (×2): qty 200

## 2020-10-19 MED ORDER — LACTATED RINGERS IV BOLUS
250.0000 mL | Freq: Once | INTRAVENOUS | Status: AC
  Administered 2020-10-19: 250 mL via INTRAVENOUS

## 2020-10-19 MED ORDER — AMIODARONE HCL IN DEXTROSE 360-4.14 MG/200ML-% IV SOLN
30.0000 mg/h | INTRAVENOUS | Status: DC
  Administered 2020-10-19 – 2020-10-21 (×4): 30 mg/h via INTRAVENOUS
  Filled 2020-10-19 (×5): qty 200

## 2020-10-19 NOTE — ED Notes (Signed)
Pt family member states pt does not eat pork, and foods in low sodium.

## 2020-10-19 NOTE — ED Notes (Signed)
Md informed rn that as long as pt is asymptomatic of low bp no fluid needed.

## 2020-10-19 NOTE — ED Notes (Signed)
Cardiologist md notified of pts soft blood pressure of 64 systolic

## 2020-10-19 NOTE — H&P (Addendum)
Cardiology Admission History and Physical:   Patient ID: Annaleigh Steinmeyer MRN: [id number]; DOB: [birth year]   Admission date: 10/18/2020  Primary Care Provider: Patient, No Pcp Per Primary Cardiologist: No primary care provider on file. Dr Rowland Lathe Primary Electrophysiologist:  None   Chief Complaint:  weakness  Patient Profile:   Keven Soucy is a 75 y.o. female with history of rheumatoid arthritis, CKD stage III, and nonischemic cardiomyopathy who presents with several days of low blood pressures, weakness, and found to be in atrial flutter.  History of Present Illness:   Ms. Perdew is a very pleasant 75 year old female with a history of rheumatoid arthritis, CKD stage III, and nonischemic cardiomyopathy who was seen in clinic by Dr. Aundra Dubin on 10/02/2020.  At that time she was noted to be NYHA class III heart failure with a recent echo EF less than 20% with moderate LV dilation and a right heart cath done with lower filling pressures and relatively preserved cardiac index at 2.2.  She is overall frail and has lost weight over the last 6 months and was referred for a CPET.  Since that clinic visit, she notes that her condition has been largely the same.  She has not noted any fevers, chills, weight gain, chest pain, worsening orthopnea, PND, lower extremity edema, or abdominal distention.  She does note palpitations, however notes that she has had this ongoing for quite a while.  At her visit with Dr. Aundra Dubin, she did have some medications changed.  She has been very compliant with her medications.  She has not been on anticoagulation in the past.  Today on arrival to the emergency department, she was found to be in atrial flutter with a low blood pressures with systolics ranging from 66Q to 90s.  Initial laboratory evaluation in the emergency department revealed normal white count, hemoglobin 12.2, BNP 1406 which is stable from a few weeks ago, creatinine 2.44.  Vitals with heart rate  120s, blood pressure 90/70.  Satting well on room air.   Past Medical History:  Diagnosis Date  . Acid reflux 07/15/2020  . Arthritis 07/15/2020  . CHF (congestive heart failure) ([place])   . Colitis 07/15/2020  . Dilated cardiomyopathy ([place]) 07/15/2020  . HFrEF (heart failure with reduced ejection fraction) ([place]) 07/15/2020  . HTN (hypertension) 07/15/2020  . Osteoporosis 07/15/2020  . Palpitations 07/15/2020  . Rheumatoid arthritis ([place]) 07/15/2020    Past Surgical History:  Procedure Laterality Date  . RIGHT HEART CATH N/A 08/14/2020   Procedure: RIGHT HEART CATH;  Surgeon: [name], MD;  Location: [facility];  Service: Cardiovascular;  Laterality: N/A;     Medications Prior to Admission: Prior to Admission medications   Medication Sig Start Date End Date Taking? Authorizing Provider  acetaminophen (TYLENOL) 500 MG tablet Take 500-1,000 mg by mouth every 6 (six) hours as needed for moderate pain.   Yes [provider]  aspirin EC 81 MG tablet Take 1 tablet (81 mg total) by mouth daily. Swallow whole. 08/08/20  Yes [name], MD  azaTHIOprine (IMURAN) 50 MG tablet Take 1 tablet (50 mg total) by mouth daily. 08/08/20  Yes [name], MD  bumetanide (BUMEX) 1 MG tablet Take 2 mg in AM and 1 mg in PM 10/15/20  Yes [name], MD  cholestyramine Lucrezia Starch) 4 g packet Take 1 packet (4 g total) by mouth daily. 08/08/20  Yes [name], MD  cycloSPORINE (RESTASIS) 0.05 % ophthalmic emulsion Place 1 drop  into both eyes 2 (two) times daily. 08/23/20  Yes [name], MD  empagliflozin (JARDIANCE) 10 MG TABS tablet Take 1 tablet (10 mg total) by mouth daily before breakfast. 10/02/20  Yes [name], MD  Ensure (ENSURE) Take 237 mLs by mouth daily.   Yes [provider]  ergocalciferol (VITAMIN D2) 1.25 MG (50000 UT) capsule Take 1 capsule (50,000 Units total) by mouth once a week. 08/08/20  Yes [name], MD  folic acid (FOLVITE)  1 MG tablet Take 1 tablet (1 mg total) by mouth daily. 08/08/20  Yes [name], MD  hydroxychloroquine (PLAQUENIL) 200 MG tablet Take 1 tablet (200 mg total) by mouth daily. 08/08/20  Yes [name], MD  ibandronate (BONIVA) 150 MG tablet Take 150 mg by mouth every 30 (thirty) days. Take in the morning with a full glass of water, on an empty stomach, and do not take anything else by mouth or lie down for the next 30 min.   Yes [provider]  Menthol, Topical Analgesic, (ICY HOT BACK EX) Apply 1 application topically daily as needed (pain).   Yes [provider]  metoprolol succinate (TOPROL XL) 50 MG 24 hr tablet Take 1 tablet (50 mg total) by mouth daily. Take with or immediately following a meal. 08/20/20  Yes [name], MD  MITIGARE 0.6 MG CAPS Take 0.6 mg by mouth daily as needed (gout). 07/18/20  Yes [provider]  Multiple Vitamin (MULTIVITAMIN) tablet Take 1 tablet by mouth daily.   Yes [provider]  omeprazole (PRILOSEC) 40 MG capsule Take 40 mg by mouth daily.   Yes [provider]  polyethylene glycol (MIRALAX / GLYCOLAX) 17 g packet Take 17 g by mouth as needed.   Yes [provider]  predniSONE (DELTASONE) 5 MG tablet Take 1 tablet (5 mg total) by mouth daily with breakfast. 08/08/20  Yes [name], MD  spironolactone (ALDACTONE) 25 MG tablet Take 1 tablet (25 mg total) by mouth daily. 09/04/20  Yes [name], MD  Vericiguat (VERQUVO) 10 MG TABS Take 10 mg by mouth daily. 10/02/20  Yes [name], MD     Allergies:    Allergies  Allergen Reactions  . Baclofen Nausea And Vomiting  . Penicillins Rash    Social History:   Social History   Socioeconomic History  . Marital status: Widowed    Spouse name: Not on file  . Number of children: Not on file  . Years of education: Not on file  . Highest education level: Not on file  Occupational History  . Not on file  Tobacco Use  .  Smoking status: Never Smoker  . Smokeless tobacco: Never Used  Substance and Sexual Activity  . Alcohol use: Never  . Drug use: Never  . Sexual activity: Not on file  Other Topics Concern  . Not on file  Social History Narrative  . Not on file   Social Determinants of Health   Financial Resource Strain: Not on file  Food Insecurity: Not on file  Transportation Needs: Not on file  Physical Activity: Not on file  Stress: Not on file  Social Connections: Not on file  Intimate Partner Violence: Not on file    Family History:  HF in mom and MI dad The patient's family history is not on file.    Review of Systems: [y] = yes, [ ]  = no     General: Weight  gain [ ] ; Weight loss Blue.Reese ]; Anorexia [ ] ; Fatigue Blue.Reese ]; Fever [ ] ; Chills [ ] ; Weakness [ ]    Cardiac: Chest pain/pressure [ ] ; Resting SOB [ ] ; Exertional SOB [ ] ; Orthopnea [ ] ; Pedal Edema [ ] ; Palpitations [ y]; Syncope [ ] ; Presyncope Blue.Reese ]; Paroxysmal nocturnal dyspnea[ ]    Pulmonary: Cough [ ] ; Wheezing[ ] ; Hemoptysis[ ] ; Sputum [ ] ; Snoring [ ]    GI: Vomiting[ ] ; Dysphagia[ ] ; Melena[ ] ; Hematochezia [ ] ; Heartburn[ ] ; Abdominal pain [ ] ; Constipation [ ] ; Diarrhea [ ] ; BRBPR [ ]    GU: Hematuria[ ] ; Dysuria [ ] ; Nocturia[ ]    Vascular: Pain in legs with walking [ ] ; Pain in feet with lying flat [ ] ; Non-healing sores [ ] ; Stroke [ ] ; TIA [ ] ; Slurred speech [ ] ;   Neuro: Headaches[ ] ; Vertigo[ ] ; Seizures[ ] ; Paresthesias[ ] ;Blurred vision [ ] ; Diplopia [ ] ; Vision changes [ ]    Ortho/Skin: Arthritis [ ] ; Joint pain [ ] ; Muscle pain [ ] ; Joint swelling [ ] ; Back Pain [ ] ; Rash [ ]    Psych: Depression[ ] ; Anxiety[ ]    Heme: Bleeding problems [ ] ; Clotting disorders [ ] ; Anemia [ ]    Endocrine: Diabetes [ ] ; Thyroid dysfunction[ ]   Physical Exam/Data:   Vitals:   10/18/20 2300 10/18/20 2315 10/18/20 2330 10/19/20 0036  BP: 95/75 (!) 113/93 (!) 96/58   Pulse: (!) 119 (!) 40 (!) 42 (!) 117  Resp: 17 20 (!) 21    Temp:      TempSrc:      SpO2: 99% 100% 100%     Intake/Output Summary (Last 24 hours) at 10/19/2020 0133 Last data filed at 10/18/2020 1903 Gross per 24 hour  Intake 250 ml  Output --  Net 250 ml   There were no vitals filed for this visit. There is no height or weight on file to calculate BMI.  General:  No apparent distress, thin  HEENT: normal Lymph: no adenopathy Neck: noJVD Endocrine:  No thryomegaly Vascular: No carotid bruits; FA pulses 2+ bilaterally without bruits  Cardiac:  normal S1, S2; RRR; no murmur  Lungs:  clear to auscultation bilaterally, no wheezing, rhonchi or rales  Abd: soft, nontender, no hepatomegaly  Ext: no edema Musculoskeletal:  Ulnar deviation in hands  Skin: warm and dry  Neuro:  CNs 2-12 intact, no focal abnormalities noted Psych:  Normal affect    EKG:  The ECG that was done  was personally reviewed and demonstrates atrial flutter  Relevant CV Studies: - LHC (2015): Normal coronaries.  - cardiac MRI (2015): EF 37%, no LGE - Echo (3/21): EF 20-25%. - Echo (11/21): EF 20-25%, moderate LV dilation, global hypokinesis, biatrial enlargement, mildly decreased RV systolic function.  - Echo (12/21): EF < 20%, moderate LV dilation, normal RV size/systolic function, moderate MR, IVC normal.  - RHC (1/22): Mean RA 1, PA 39/14, mean PCWP 10, PVR 3.9 WU, CI 2.22.   Laboratory Data:  Chemistry Recent Labs  Lab 10/14/20 1606 10/18/20 1940  NA 130* 129*  K 4.8 5.1  CL 94* 95*  CO2 24 22  GLUCOSE 113* 99  BUN 67* 72*  CREATININE 2.52* 2.44*  CALCIUM 9.7 8.9  GFRNONAA 19* 20*  ANIONGAP 12 12    No results for input(s): PROT, ALBUMIN, AST, ALT, ALKPHOS, BILITOT in the last 168 hours. Hematology Recent Labs  Lab 10/18/20 1811  WBC 9.6  RBC 4.32  HGB 12.2  HCT 38.0  MCV 88.0  MCH 28.2  MCHC 32.1  RDW 19.4*  PLT 294   Cardiac EnzymesNo results for input(s): TROPONINI in the last 168 hours. No results for input(s): TROPIPOC in the  last 168 hours.  BNP Recent Labs  Lab 10/18/20 1833  BNP 1,406.2*    DDimer No results for input(s): DDIMER in the last 168 hours.  Radiology/Studies:  DG Chest Portable 1 View  Result Date: 10/18/2020 CLINICAL DATA:  Shortness of breath and hypotension. EXAM: PORTABLE CHEST 1 VIEW COMPARISON:  Radiograph 08/29/2020 FINDINGS: Cardiac enlargement. Known dilated cardiomyopathy. The mediastinal and hilar contours are within normal limits. The lungs are clear. No pulmonary edema, pleural effusions or pulmonary infiltrates. Streaky left basilar scarring changes or atelectasis. No pulmonary lesions. The bony thorax is intact. IMPRESSION: 1. Cardiac enlargement. 2. No acute cardiopulmonary findings. Electronically Signed   By: Marijo Sanes M.D.   On: 10/18/2020 19:15    Assessment and Plan:   1. Atrial flutter.  We will fractionate her metoprolol at 12.5 mg every 6 hours and see how she tolerates this.  She has been tolerating 50 mg metoprolol XL up until presentation. CV 4 and not high bleeding risk so will start apixaban 2.5 mg (reduced because wt < 60kg and sCr > 1.6). She may need consideration of TEE/CV if her blood pressures remain low and rate control is not tolerated. May need to have EP weigh in  2. Chronic systolic CHF. She does not appear in exacerbation. I believe her symptoms are driven by flutter. Metoprolol as above. Continue bumex. She received fluids in ED which is OK given her low normal filling pressures on prior caths. Continue verquvo, spiro, and empa. Can alert Dr. Aundra Dubin of her admission.  3. CKD Stage 3. Monitor creatinine. Will need to be followed by nephrology in outpatient setting. 4. Rheumatoid arthritis. Stable. Continue pred, azathioprine, HCQ 5. GERD. Continue ppi  Severity of Illness: The appropriate patient status for this patient is INPATIENT. Inpatient status is judged to be reasonable and necessary in order to provide the required intensity of service to ensure  the patient's safety. The patient's presenting symptoms, physical exam findings, and initial radiographic and laboratory data in the context of their chronic comorbidities is felt to place them at high risk for further clinical deterioration. Furthermore, it is not anticipated that the patient will be medically stable for discharge from the hospital within 2 midnights of admission. The following factors support the patient status of inpatient.   " The patient's presenting symptoms include palpitations, hypotension. " The worrisome physical exam findings include atrial flutter. " The initial radiographic and laboratory data are worrisome because of elevated creatinine. " The chronic co-morbidities include NICM, CKD.   * I certify that at the point of admission it is my clinical judgment that the patient will require inpatient hospital care spanning beyond 2 midnights from the point of admission due to high intensity of service, high risk for further deterioration and high frequency of surveillance required.*    For questions or updates, please contact [place] Please consult [email or web address] for contact info under        Signed, Doyne Keel, MD  10/19/2020 1:33 AM   Patient seen and examined and agree with Doyne Keel, MD as detailed above.  In brief, the patient is a 75 year old female with history of rheumatoid arthritis, CKD stage III, and nonischemic cardiomyopathy who presents with several days of low blood pressures, weakness, and found  to be in atrial flutter for which Cardiology has been consulted.   On initial presentation to the ER, patient in [place] with HR 376 with systolics in 28-31D. Received 250cc fluid and placed on metop for rate control. HR better this AM in the 17O-160V but systolics still soft in low 80s. Patient feels weak and tired but better than prior. No palpitations, SOB, orthopnea, PND, lightheadedness or dizziness. States blood pressures are low  at baseline mainly in the 90s (sometimes high 80s at home). Appears euvolemic currently.   Given soft blood pressure, will transition from metop to amiodarone for rate control temporarily and hold diuretics as patient does not appear to be hypervolemic or decompensated from a HF standpoint. Goal will be to transition back to BB once blood pressure improved. If sustains Aflutter and remains hypotensive, may merit TEE/DCCV.  Exam: GEN: Frail, elderly female, no acute distress Neck: No JVD Cardiac: RRR, no murmurs, rubs, or gallops.  Respiratory: Clear to auscultation bilaterally. GI: Soft, nontender, non-distended  MS: No edema; No deformity. Neuro:  Nonfocal  Psych: Normal affect   Plan: -Hold diuretics for now given hypotension and patient is not overloaded on examination -Change BB to amiodarone for rate control given hypotension -Not a good candidate for dig given CKD -If remains in Aflutter with soft blood pressures, may need TEE/DCCV -Started on apixaban 2.5mg  BID for AC; hold ASA while on [facility] -Once more clinically stable, will add back GDMT as tolerated  Gwyndolyn Kaufman, MD

## 2020-10-20 DIAGNOSIS — I428 Other cardiomyopathies: Secondary | ICD-10-CM

## 2020-10-20 MED ORDER — MELATONIN 3 MG PO TABS: 3.0000 mg | ORAL_TABLET | Freq: Once | ORAL | Status: DC

## 2020-10-20 NOTE — Progress Notes (Signed)
Progress Note  Patient Name: Tracy Nelson Date of Encounter: 10/20/2020  [facility] Cardiologist: No primary care provider on file.   Subjective   Patient states she feels tired but overall improved. Remains in flutter with HR 70s.  Blood pressures improved to 90s/60s today Cr 2.39 Lactate normal at 1.8 Net positive 172  Inpatient Medications    Scheduled Meds: . apixaban  2.5 mg Oral BID  . azaTHIOprine  50 mg Oral Daily  . cholestyramine  4 g Oral Q24H  . cycloSPORINE  1 drop Both Eyes BID  . folic acid  1 mg Oral Daily  . hydroxychloroquine  200 mg Oral Daily  . melatonin  3 mg Oral Once  . pantoprazole  40 mg Oral Daily  . predniSONE  5 mg Oral Q breakfast  . Vericiguat  10 mg Oral Daily   Continuous Infusions: . amiodarone 30 mg/hr (10/20/20 1026)   PRN Meds: acetaminophen, ondansetron (ZOFRAN) IV   Vital Signs    Vitals:   10/20/20 0440 10/20/20 0450 10/20/20 0748 10/20/20 1129  BP: (!) 79/66 (!) 87/71 91/76 92/67   Pulse: (!) 54 77 73 65  Resp: 14  16 17   Temp: 97.7 F (36.5 C)  97.7 F (36.5 C) 97.7 F (36.5 C)  TempSrc: Oral  Oral Oral  SpO2: 100% 100% 100% 100%  Weight: 50.6 kg     Height:        Intake/Output Summary (Last 24 hours) at 10/20/2020 1206 Last data filed at 10/20/2020 1100 Gross per 24 hour  Intake 1117.29 ml  Output 750 ml  Net 367.29 ml   Last 3 Weights 10/20/2020 10/19/2020 10/02/2020  Weight (lbs) 111 lb 8.8 oz 106 lb 8 oz 102 lb 9.6 oz  Weight (kg) 50.6 kg 48.308 kg 46.539 kg      Telemetry    Aflutter with HR 70-80s, occasional PVCs - Personally Reviewed  ECG    No new tracing - Personally Reviewed  Physical Exam   GEN: Frail elderly female, NAD  Neck: No JVD Cardiac: RRR, no murmurs, rubs, or gallops.  Respiratory: Clear to auscultation bilaterally. GI: Soft, nontender, non-distended  MS: No edema; No deformity. Neuro:  Nonfocal  Psych: Normal affect   Labs    High Sensitivity Troponin:  No  results for input(s): TROPONINIHS in the last 720 hours.    Chemistry Recent Labs  Lab 10/14/20 1606 10/18/20 1940 10/19/20 0328  NA 130* 129* 130*  K 4.8 5.1 3.9  CL 94* 95* 96*  CO2 24 22 24   GLUCOSE 113* 99 104*  BUN 67* 72* 73*  CREATININE 2.52* 2.44* 2.39*  CALCIUM 9.7 8.9 9.1  PROT  --   --  6.0*  ALBUMIN  --   --  2.9*  AST  --   --  27  ALT  --   --  13  ALKPHOS  --   --  55  BILITOT  --   --  0.7  GFRNONAA 19* 20* 21*  ANIONGAP 12 12 10      Hematology Recent Labs  Lab 10/18/20 1811 10/19/20 0328  WBC 9.6 8.0  RBC 4.32 3.81*  HGB 12.2 11.1*  HCT 38.0 33.0*  MCV 88.0 86.6  MCH 28.2 29.1  MCHC 32.1 33.6  RDW 19.4* 19.2*  PLT 294 246    BNP Recent Labs  Lab 10/18/20 1833  BNP 1,406.2*     DDimer No results for input(s): DDIMER in the last 168 hours.  Radiology    DG Chest Portable 1 View  Result Date: 10/18/2020 CLINICAL DATA:  Shortness of breath and hypotension. EXAM: PORTABLE CHEST 1 VIEW COMPARISON:  Radiograph 08/29/2020 FINDINGS: Cardiac enlargement. Known dilated cardiomyopathy. The mediastinal and hilar contours are within normal limits. The lungs are clear. No pulmonary edema, pleural effusions or pulmonary infiltrates. Streaky left basilar scarring changes or atelectasis. No pulmonary lesions. The bony thorax is intact. IMPRESSION: 1. Cardiac enlargement. 2. No acute cardiopulmonary findings. Electronically Signed   By: Marijo Sanes M.D.   On: 10/18/2020 19:15    Cardiac Studies   - LHC (2015): Normal coronaries.  - cardiac MRI (2015): EF 37%, no LGE - Echo (3/21): EF 20-25%. - Echo (11/21): EF 20-25%, moderate LV dilation, global hypokinesis, biatrial enlargement, mildly decreased RV systolic function.  - Echo (12/21): EF <20%, moderate LV dilation, normal RV size/systolic function, moderate MR, IVC normal.  - RHC (1/22): Mean RA 1, PA 39/14, mean PCWP 10, PVR 3.9 WU, CI 2.22.   Patient Profile     75 y.o. female with history of  rheumatoid arthritis, CKD stage III, and nonischemic cardiomyopathy who presents with several days of low blood pressures, weakness, and found to be in new onset atrial flutter.   Assessment & Plan    #Atrial Flutter: Newly diagnosed. CHADs-vasc 4. Started on apixaban for [facility]. Initially on metop for rate control which was switched to amiodarone due to soft blood pressures.  -Continue amiodarone gtt today -Transition back to metop tomorrow given improvement in blood pressures -Continue apixaban 2.5mg  BID  #Chronic systolic heart failure: SPQZ<30%. Appeared dry on initial presentation which may have been contributing to soft blood pressures. Diuretics held. Cr down-trending. -Hold diuretics today -Likely resume bumex at reduced dose tomorrow -Add back spiro and jardiance as tolerated -Plan to resume metop tomorrow  #CKD stage 3: Cr stable. Holding diuresis for now. -Continue to hold diuresis for today; plan to resume tomorrow  #RA: -Continue home meds  #GERD: -Continue PPI  For questions or updates, please contact [place] Please consult [email or web address] for contact info under        Signed, Freada Bergeron, MD  10/20/2020, 12:06 PM

## 2020-10-21 DIAGNOSIS — I5023 Acute on chronic systolic (congestive) heart failure: Secondary | ICD-10-CM

## 2020-10-21 DIAGNOSIS — I5043 Acute on chronic combined systolic (congestive) and diastolic (congestive) heart failure: Secondary | ICD-10-CM

## 2020-10-21 LAB — BASIC METABOLIC PANEL
Anion gap: 9 (ref 5–15)
BUN: 52 mg/dL — ABNORMAL HIGH (ref 8–23)
CO2: 23 mmol/L (ref 22–32)
Calcium: 9.1 mg/dL (ref 8.9–10.3)
Chloride: 103 mmol/L (ref 98–111)
Creatinine, Ser: 2.24 mg/dL — ABNORMAL HIGH (ref 0.44–1.00)
GFR, Estimated: 22 mL/min — ABNORMAL LOW (ref >60)
Glucose, Bld: 102 mg/dL — ABNORMAL HIGH (ref 70–99)
Potassium: 3.6 mmol/L (ref 3.5–5.1)
Sodium: 135 mmol/L (ref 135–145)

## 2020-10-21 LAB — MAGNESIUM: Magnesium: 2.4 mg/dL (ref 1.7–2.4)

## 2020-10-21 LAB — TSH: TSH: 3.322 u[IU]/mL (ref 0.350–4.500)

## 2020-10-21 MED ORDER — POLYETHYLENE GLYCOL 3350 17 G PO PACK
17.0000 g | PACK | Freq: Every day | ORAL | Status: DC | PRN
  Administered 2020-10-21 – 2020-10-22 (×2): 17 g via ORAL
  Filled 2020-10-21 (×2): qty 1

## 2020-10-21 MED ORDER — DIGOXIN 125 MCG PO TABS
0.1250 mg | ORAL_TABLET | Freq: Every day | ORAL | Status: DC
  Administered 2020-10-21: 0.125 mg via ORAL
  Filled 2020-10-21: qty 1

## 2020-10-21 MED ORDER — AMIODARONE HCL 200 MG PO TABS: 200.0000 mg | ORAL_TABLET | Freq: Every day | ORAL | Status: DC

## 2020-10-21 MED ORDER — DIGOXIN 125 MCG PO TABS
0.0625 mg | ORAL_TABLET | Freq: Every day | ORAL | Status: DC
Start: 2020-10-22 — End: 2020-10-22
  Administered 2020-10-22: 0.0625 mg via ORAL
  Filled 2020-10-21: qty 1

## 2020-10-21 MED ORDER — AMIODARONE HCL 200 MG PO TABS
200.0000 mg | ORAL_TABLET | Freq: Two times a day (BID) | ORAL | Status: DC
  Administered 2020-10-21 – 2020-10-22 (×3): 200 mg via ORAL
  Filled 2020-10-21 (×3): qty 1

## 2020-10-21 NOTE — Consult Note (Addendum)
Advanced Heart Failure Team Consult Note   Primary Physician: Patient, No Pcp Per PCP-Cardiologist:  Dr Aundra Dubin  Reason for Consultation: Heart Failure   HPI:    Tracy Nelson is seen today for evaluation of heart failure at the request of Dr Meda Coffee   Tracy Nelson is a 75 year old with a history of RA, DMII, CKD Stage III, GERD, SVT, NICM, and chronic systolic heart failure. Had Edith Endave back in 2015 with normal coronaries. EF has been low since 2015.    Relocated from [place] in and was followed by Dr Debara Pickett. Last admitted to [facility] [id number] with A/C systolic heart failure. Diuresed with IV lasix and later transitioned to bumex 3 mg twice a day.   Relocated from [place] to [facility] 06/2020. Referred to Dr Aundra Dubin for ongoing HF management.    In January of this year she had RHC that showed low filling pressures and preserved cardiac index.   Followed in the HF clinic by Dr Aundra Dubin and was last seen 10/02/20. At that time she was started on farxigan and verquvo was increased. Set up for CPX. CPX 10/08/2020 Peak VO2 10, Slope 55, RER 1.1 with severe heart limitation. Had resting hypotension with blunted BP response to exercise.   Followed by [facility] and had been hypotensive and nausea. SBP had been 60-80s. Weight at home 99-100. She does not use a cane or walker at home. Says she uses the "walls ". Lives with her family.   Presented to [facility] with weakness. Hypotensive and in A flutter. Given IV fluids + metoprolol. Pertinent admission labs included BNP 1406, hgb 12, creatinine 2.4, and hgb 11. Admitted by cardiology. Metoprolol switched to amiodarone. Started on eliquis 2.5 mg twice a day. Converted to NSR.   Lactic Acid 1.8  Creatinine 2.4>2.4>2.2  Feeling some better but weak. Denies SOB.   Cardiac Testing.  LHC (2015): Normal coronaries.  - cardiac MRI (2015): EF 37%, no LGE - Echo (3/21): EF 20-25%. - Echo (11/21): EF 20-25%, moderate LV dilation, global hypokinesis, biatrial  enlargement, mildly decreased RV systolic function.  - Echo (12/21): EF < 20%, moderate LV dilation, normal RV size/systolic function, moderate MR, IVC normal.  - RHC (1/22): Mean RA 1, PA 39/14, mean PCWP 10, PVR 3.9 WU, CI 2.22.  Review of Systems: [y] = yes, [ ]  = no   . General: Weight gain [ ] ; Weight loss [ ] ; Anorexia [ ] ; Fatigue [ Y]; Fever [ ] ; Chills [ ] ; Weakness [Y ]  . Cardiac: Chest pain/pressure [ ] ; Resting SOB [ Y]; Exertional SOB [ Y]; Orthopnea [ ] ; Pedal Edema [ ] ; Palpitations [ ] ; Syncope [ ] ; Presyncope [ ] ; Paroxysmal nocturnal dyspnea[ ]   . Pulmonary: Cough [ ] ; Wheezing[ ] ; Hemoptysis[ ] ; Sputum [ ] ; Snoring [ ]   . GI: Vomiting[ ] ; Dysphagia[ ] ; Melena[ ] ; Hematochezia [ ] ; Heartburn[ ] ; Abdominal pain [Y ]; Constipation [ ] ; Diarrhea [ ] ; BRBPR [ ]   . GU: Hematuria[ ] ; Dysuria [ ] ; Nocturia[ ]   . Vascular: Pain in legs with walking [ ] ; Pain in feet with lying flat [ ] ; Non-healing sores [ ] ; Stroke [ ] ; TIA [ ] ; Slurred speech [ ] ;  . Neuro: Headaches[ ] ; Vertigo[ ] ; Seizures[ ] ; Paresthesias[ ] ;Blurred vision [ ] ; Diplopia [ ] ; Vision changes [ ]   . Ortho/Skin: Arthritis [ Y]; Joint pain [Y ]; Muscle pain [ ] ; Joint swelling [ Y]; Back Pain [Y ]; Rash [ ]   . Psych:  Depression[ Y]; Anxiety[ ]   . Heme: Bleeding problems [ ] ; Clotting disorders [ ] ; Anemia [ ]   . Endocrine: Diabetes [ Y]; Thyroid dysfunction[ ]   Home Medications Prior to Admission medications   Medication Sig Start Date End Date Taking? Authorizing Provider  acetaminophen (TYLENOL) 500 MG tablet Take 500-1,000 mg by mouth every 6 (six) hours as needed for moderate pain.   Yes [provider]  aspirin EC 81 MG tablet Take 1 tablet (81 mg total) by mouth daily. Swallow whole. 08/08/20  Yes [name], MD  azaTHIOprine (IMURAN) 50 MG tablet Take 1 tablet (50 mg total) by mouth daily. 08/08/20  Yes [name], MD  bumetanide (BUMEX) 1 MG tablet Take 2 mg in AM and 1 mg in PM 10/15/20   Yes [name], MD  cholestyramine Lucrezia Starch) 4 g packet Take 1 packet (4 g total) by mouth daily. 08/08/20  Yes [name], MD  cycloSPORINE (RESTASIS) 0.05 % ophthalmic emulsion Place 1 drop into both eyes 2 (two) times daily. 08/23/20  Yes [name], MD  empagliflozin (JARDIANCE) 10 MG TABS tablet Take 1 tablet (10 mg total) by mouth daily before breakfast. 10/02/20  Yes [name], MD  Ensure (ENSURE) Take 237 mLs by mouth daily.   Yes [provider]  ergocalciferol (VITAMIN D2) 1.25 MG (50000 UT) capsule Take 1 capsule (50,000 Units total) by mouth once a week. 08/08/20  Yes [name], MD  folic acid (FOLVITE) 1 MG tablet Take 1 tablet (1 mg total) by mouth daily. 08/08/20  Yes [name], MD  hydroxychloroquine (PLAQUENIL) 200 MG tablet Take 1 tablet (200 mg total) by mouth daily. 08/08/20  Yes [name], MD  ibandronate (BONIVA) 150 MG tablet Take 150 mg by mouth every 30 (thirty) days. Take in the morning with a full glass of water, on an empty stomach, and do not take anything else by mouth or lie down for the next 30 min.   Yes [provider]  Menthol, Topical Analgesic, (ICY HOT BACK EX) Apply 1 application topically daily as needed (pain).   Yes [provider]  metoprolol succinate (TOPROL XL) 50 MG 24 hr tablet Take 1 tablet (50 mg total) by mouth daily. Take with or immediately following a meal. 08/20/20  Yes [name], MD  MITIGARE 0.6 MG CAPS Take 0.6 mg by mouth daily as needed (gout). 07/18/20  Yes [provider]  Multiple Vitamin (MULTIVITAMIN) tablet Take 1 tablet by mouth daily.   Yes [provider]  omeprazole (PRILOSEC) 40 MG capsule Take 40 mg by mouth daily.   Yes [provider]  polyethylene glycol (MIRALAX / GLYCOLAX) 17 g packet Take 17 g by mouth as needed.   Yes [provider]  predniSONE (DELTASONE) 5 MG tablet Take 1 tablet (5 mg total) by mouth daily  with breakfast. 08/08/20  Yes [name], MD  spironolactone (ALDACTONE) 25 MG tablet Take 1 tablet (25 mg total) by mouth daily. 09/04/20  Yes [name], MD  Vericiguat (VERQUVO) 10 MG TABS Take 10 mg by mouth daily. 10/02/20  Yes [name], MD    Past Medical History: Past Medical History:  Diagnosis Date  . Acid reflux 07/15/2020  . Arthritis 07/15/2020  . CHF (congestive heart failure) ([place])   . Colitis 07/15/2020  . Dilated cardiomyopathy ([place]) 07/15/2020  . HFrEF (heart failure with reduced ejection fraction) ([place]) 07/15/2020  .  HTN (hypertension) 07/15/2020  . Osteoporosis 07/15/2020  . Palpitations 07/15/2020  . Rheumatoid arthritis ([place]) 07/15/2020    Past Surgical History: Past Surgical History:  Procedure Laterality Date  . RIGHT HEART CATH N/A 08/14/2020   Procedure: RIGHT HEART CATH;  Surgeon: [name], MD;  Location: [facility];  Service: Cardiovascular;  Laterality: N/A;    Family History: History reviewed. No pertinent family history.  Social History: Social History   Socioeconomic History  . Marital status: Widowed    Spouse name: Not on file  . Number of children: Not on file  . Years of education: Not on file  . Highest education level: Not on file  Occupational History  . Not on file  Tobacco Use  . Smoking status: Never Smoker  . Smokeless tobacco: Never Used  Substance and Sexual Activity  . Alcohol use: Never  . Drug use: Never  . Sexual activity: Not on file  Other Topics Concern  . Not on file  Social History Narrative  . Not on file   Social Determinants of Health   Financial Resource Strain: Not on file  Food Insecurity: Not on file  Transportation Needs: Not on file  Physical Activity: Not on file  Stress: Not on file  Social Connections: Not on file    Allergies:  Allergies  Allergen Reactions  . Baclofen Nausea And Vomiting  . Penicillins Rash    Objective:    Vital Signs:   Temp:  [97.6 F  (36.4 C)-98.2 F (36.8 C)] 97.6 F (36.4 C) (03/14 1143) Pulse Rate:  [70-81] 77 (03/14 1143) Resp:  [15-16] 16 (03/14 1143) BP: (80-119)/(61-91) 102/91 (03/14 1143) SpO2:  [99 %-100 %] 100 % (03/14 1143) Weight:  [51 kg] 51 kg (03/14 0429) Last BM Date: 10/19/20  Weight change: Filed Weights   10/19/20 1410 10/20/20 0440 10/21/20 0429  Weight: 48.3 kg 50.6 kg 51 kg    Intake/Output:   Intake/Output Summary (Last 24 hours) at 10/21/2020 1233 Last data filed at 10/21/2020 0942 Gross per 24 hour  Intake 985.17 ml  Output 650 ml  Net 335.17 ml      Physical Exam    General:  Moon Face. No resp difficulty HEENT: normal Neck: supple. JVP 7-8. Carotids 2+ bilat; no bruits. No lymphadenopathy or thyromegaly appreciated. Cor: PMI nondisplaced. Irregular rate & rhythm. No rubs, gallops or murmurs. Lungs: clear Abdomen: soft, nontender, nondistended. No hepatosplenomegaly. No bruits or masses. Good bowel sounds. Extremities: no cyanosis, clubbing, rash, edema. LLE posterior aspect intact scab Neuro: alert & orientedx3, cranial nerves grossly intact. moves all 4 extremities w/o difficulty. Affect pleasant   Telemetry   NSR with occasional PVCs/PACs  79 bpm.  EKG    On admit A flutter 118 bpm   Labs   Basic Metabolic Panel: Recent Labs  Lab 10/14/20 1606 10/18/20 1940 10/19/20 0328 10/21/20 0852  NA 130* 129* 130* 135  K 4.8 5.1 3.9 3.6  CL 94* 95* 96* 103  CO2 24 22 24 23   GLUCOSE 113* 99 104* 102*  BUN 67* 72* 73* 52*  CREATININE 2.52* 2.44* 2.39* 2.24*  CALCIUM 9.7 8.9 9.1 9.1  MG  --   --   --  2.4    Liver Function Tests: Recent Labs  Lab 10/19/20 0328  AST 27  ALT 13  ALKPHOS 55  BILITOT 0.7  PROT 6.0*  ALBUMIN 2.9*   No results for input(s): LIPASE, AMYLASE in the last 168 hours. No  results for input(s): AMMONIA in the last 168 hours.  CBC: Recent Labs  Lab 10/18/20 1811 10/19/20 0328  WBC 9.6 8.0  HGB 12.2 11.1*  HCT 38.0 33.0*  MCV  88.0 86.6  PLT 294 246    Cardiac Enzymes: No results for input(s): CKTOTAL, CKMB, CKMBINDEX, TROPONINI in the last 168 hours.  BNP: BNP (last 3 results) Recent Labs    08/08/20 1212 10/02/20 1600 10/18/20 1833  BNP 2,904.0* 1,385.2* 1,406.2*    ProBNP (last 3 results) No results for input(s): PROBNP in the last 8760 hours.   CBG: No results for input(s): GLUCAP in the last 168 hours.  Coagulation Studies: Recent Labs    10/19/20 0328  LABPROT 13.0  INR 1.0     Imaging    No results found.   Medications:     Current Medications: . amiodarone  200 mg Oral BID  . apixaban  2.5 mg Oral BID  . azaTHIOprine  50 mg Oral Daily  . cholestyramine  4 g Oral Q24H  . cycloSPORINE  1 drop Both Eyes BID  . digoxin  0.125 mg Oral Daily  . folic acid  1 mg Oral Daily  . hydroxychloroquine  200 mg Oral Daily  . melatonin  3 mg Oral Once  . pantoprazole  40 mg Oral Daily  . predniSONE  5 mg Oral Q breakfast  . Vericiguat  10 mg Oral Daily     Infusions:    Assessment/Plan   1. Hypotension  - On admit SBP  ~80s and in A flutter RVR.   - Diuretics held. Given IV fluids with improvement. Recently started SGT2i as an outpatient not sure we can restart.  - Lactic Acid was not elevated. WBC 9.6  - Stable today.    2. A Flutter RVR -On admit. Chemically converted with amiodarone. Maintaining NSR. Continue po amiodarone.  -TSH 3.3  - Continue eliquis 2.5 mg twice a day (lower dose with elevated creatinine and weight )  3. Chronic Systolic Heart Failure  -NICM dating back to 2015. LHC with 2015 with normal cors. Had CMRI 2015 that showed no LGE -Echo 07/2020 EF < 20% RV normal  -CPX Peak VO2 10, slope 55, RER 1.13. --> Not sure she will be Advanced Therapy candidate.  - No bb with hypotension. BP has been low for some time.  - BP improved today.  - Volume status mildly elevated. Start bumex tomorrow.  -Started on digoxin earlier today.   - Failed entresto in  past due to hypotension.  - Continue vericguat - Hold off on spiro.   3. CKD Stage IIIb -Creatinine baseline 2~2.2  -Creatinine on admit 2.4  -Follow daily bmet   4. RA -No history of lung involvement.  - On chronic prednisone, hdroxychloroquine, + azathioprine.  - Continue protonix. - Needs to establish with Rheumatology   5. Deconditioning Consult PT. Followed by HHPT prior to admit.    Length of Stay: 3  Amy Clegg, NP  10/21/2020, 12:33 PM  Advanced Heart Failure Team Pager [phone] (M-F; 7a - 5p)  Please contact [facility] for night-coverage after hours (4p -7a ) and weekends on [email or web address]   Patient seen and examined with the above-signed Advanced Practice Provider and/or Housestaff. I personally reviewed laboratory data, imaging studies and relevant notes. I independently examined the patient and formulated the important aspects of the plan. I have edited the note to reflect any of my changes or salient points. I have personally discussed the plan  with the patient and/or family.  75 y/o woman with advanced systolic HF due to NICM, CKD 3b and RA. Recently relocated from [place], [place] due to failure to thrive. Recent CPX high-risk.   Admitted with hypotension and HF symptoms in setting of new onset AF. Converted with IV amio.   Now back in NSR. Feels much better. Very anxious with multiple somatic complaints.   General:  Elderly woman No resp difficulty HEENT: Moon facies normal Neck: supple. JVP 7 Carotids 2+ bilat; no bruits. No lymphadenopathy or thryomegaly appreciated. Cor: PMI nondisplaced. Regular rate & rhythm. No rubs, gallops or murmurs. Lungs: clear Abdomen: soft, nontender, nondistended. No hepatosplenomegaly. No bruits or masses. Good bowel sounds. Extremities: no cyanosis, clubbing, rash, edema + RA changes Neuro: alert & orientedx3, cranial nerves grossly intact. moves all 4 extremities w/o difficulty. Affect pleasant  Admitted with worsening HF in  setting of new onset AF. Now improved with restoration of NSR. On eliquis and amio.   Unfortunately not candidate for advanced HF therapies due to age and CKD.   Will likely need to have Palliative Care involved to help with [place] soon. I worry that anxiety also a major issue for her.   Glori Bickers, MD  5:49 PM

## 2020-10-21 NOTE — TOC Benefit Eligibility Note (Signed)
Transition of Care [facility]) Benefit Eligibility Note    Patient Details  Name: Tracy Nelson MRN: [id number] Date of Birth: August 24, 1945   Medication/Dose: Eliquis 5mg . bid 30 day supply  Covered?: Yes  Tier: 2 Drug  Prescription Coverage [facility]erred [facility]: [facility]  Spoke with Person/Company/Phone Number:: Costella Hatcher W/[employer] PJ#[phone]  Co-Pay: $40.00  Prior Approval: No  Deductible:  (no deductible)       Shelda Altes Phone Number: 10/21/2020, 2:11 PM

## 2020-10-21 NOTE — Progress Notes (Addendum)
Progress Note  Patient Name: Tracy Nelson Date of Encounter: 10/21/2020  [facility] Cardiologist: Dr. Loralie Champagne  Subjective   No acute overnight events. She has converted to sinus rhythm. Patient feeling better this morning. She notes some shortness of breath but this is chronic. She also notes a vague chest tightness but states she has had this before. She had difficult describing this discomfort. No palpitations.   Inpatient Medications    Scheduled Meds: . amiodarone  200 mg Oral Daily  . apixaban  2.5 mg Oral BID  . azaTHIOprine  50 mg Oral Daily  . cholestyramine  4 g Oral Q24H  . cycloSPORINE  1 drop Both Eyes BID  . digoxin  0.125 mg Oral Daily  . folic acid  1 mg Oral Daily  . hydroxychloroquine  200 mg Oral Daily  . melatonin  3 mg Oral Once  . pantoprazole  40 mg Oral Daily  . predniSONE  5 mg Oral Q breakfast  . Vericiguat  10 mg Oral Daily   Continuous Infusions:  PRN Meds: acetaminophen, ondansetron (ZOFRAN) IV   Vital Signs    Vitals:   10/21/20 0100 10/21/20 0429 10/21/20 0822 10/21/20 1143  BP: (!) 86/68 110/90 118/77 (!) 102/91  Pulse: 78 81 70 77  Resp:  15 16 16   Temp:  97.8 F (36.6 C) 97.8 F (36.6 C) 97.6 F (36.4 C)  TempSrc:  Oral Oral Axillary  SpO2:  100% 100% 100%  Weight:  51 kg    Height:        Intake/Output Summary (Last 24 hours) at 10/21/2020 1213 Last data filed at 10/21/2020 0942 Gross per 24 hour  Intake 985.17 ml  Output 650 ml  Net 335.17 ml   Last 3 Weights 10/21/2020 10/20/2020 10/19/2020  Weight (lbs) 112 lb 7 oz 111 lb 8.8 oz 106 lb 8 oz  Weight (kg) 51 kg 50.6 kg 48.308 kg      Telemetry    Sinus rhythm with rates in the 60's to 70's. Occasional PVCs and PACs. - Personally Reviewed  ECG    No new ECG tracing today. - Personally Reviewed  Physical Exam   GEN: No acute distress.   Neck: No JVD Cardiac: RRR. No murmurs, rubs, or gallops. Radial pulses 2+ and equal bilaterally. Respiratory: Clear  to auscultation bilaterally. No wheezes, rhonchi, or rales. GI: Soft, non-distended, and non-tender. MS: No lower extremity edema. No deformity. Skin: Warm and dry. Neuro:  No focal deficits. Psych: Normal affect. Responds appropriately.  Labs    High Sensitivity Troponin:  No results for input(s): TROPONINIHS in the last 720 hours.    Chemistry Recent Labs  Lab 10/18/20 1940 10/19/20 0328 10/21/20 0852  NA 129* 130* 135  K 5.1 3.9 3.6  CL 95* 96* 103  CO2 22 24 23   GLUCOSE 99 104* 102*  BUN 72* 73* 52*  CREATININE 2.44* 2.39* 2.24*  CALCIUM 8.9 9.1 9.1  PROT  --  6.0*  --   ALBUMIN  --  2.9*  --   AST  --  27  --   ALT  --  13  --   ALKPHOS  --  55  --   BILITOT  --  0.7  --   GFRNONAA 20* 21* 22*  ANIONGAP 12 10 9      Hematology Recent Labs  Lab 10/18/20 1811 10/19/20 0328  WBC 9.6 8.0  RBC 4.32 3.81*  HGB 12.2 11.1*  HCT 38.0 33.0*  MCV 88.0 86.6  MCH 28.2 29.1  MCHC 32.1 33.6  RDW 19.4* 19.2*  PLT 294 246    BNP Recent Labs  Lab 10/18/20 1833  BNP 1,406.2*     DDimer No results for input(s): DDIMER in the last 168 hours.   Radiology    No results found.  Cardiac Studies   Echocardiogram 08/08/2020: Impressions: 1. Left ventricular ejection fraction, by estimation, is <20%. The left  ventricle has severely decreased function. The left ventricle demonstrates  global hypokinesis. The left ventricular internal cavity size was  moderately dilated. There is mild  concentric left ventricular hypertrophy. Left ventricular diastolic  parameters are indeterminate, but suggestive of increased left atrial  pressure. No left ventricular thrombus visualized.  2. Right ventricular systolic function is normal. The right ventricular  size is normal. There is moderately elevated pulmonary artery systolic  pressure. The estimated right ventricular systolic pressure is 38.1 mmHg.  3. Right atrial size was moderately dilated.  4. The mitral valve is  grossly normal. Moderate mitral valve  regurgitation. No evidence of mitral stenosis.  5. Tricuspid valve regurgitation is mild to moderate.  6. The aortic valve is tricuspid. There is mild calcification of the  aortic valve. There is mild thickening of the aortic valve. Aortic valve  regurgitation is moderate.  7. The inferior vena cava is normal in size with greater than 50%  respiratory variability, suggesting right atrial pressure of 3 mmHg.   Comparison(s): No prior Echocardiogram.   Conclusion(s)/Recommendation(s): Scheduled to see advanced heart failure  team.  _______________  Right Heart Catheterization 08/14/2020: 1. Normal filling pressures.  2. Relatively preserved cardiac output.  3. Mild pulmonary arterial hypertension _______________  Cardiopulmonary Exercise Test 10/08/2020: Conclusion: Exercise testing with gas exchange demonstrates severe functional impairment when compared to matched sedentary norms. Patient is significantly musculoskeletally deconditioned with a severe HF limitation in combination with multiple high risk features; severely elevated VE/VCO2 slope and hypotension with blunted BP response to exercise, as well as reduced OUES.  Patient Profile     75 y.o. female with a history of no significant CAD on cardiac catheterization in 2015, non-ischemic cardiomyopathy with chronic systolic CHF and known EF of <20% on Echo in 07/2020, atrial flutter on Eliquis, rheumatoid arthritis, CKD stage III, and GERD who was admitted on 10/19/2020 with new onset atrial flutter.  Assessment & Plan    New Onset Atrial Flutter - Converted to sinus rhythm with PVC/PACs. Rates in the 60's to 70's. - Initially on Metoprolol for rate control but was switched to Amiodarone due to soft BP. - Currently on Amiodarone drip. Can switch to PO today. Will discuss dosing with MD. - BP soft overnight so will discuss adding back Toprol with MD. - CHA2DS2-VASc = 4 (CHF, HTN, age x2,  female). Continue Eliquis 2.5mg  twice daily (reduced dose given weight and renal function).  Chronic Systolic CHF Non-Ischemic Cardiomyopathy  - BNP elevated at 1,406 on admission. However, patient actually appeared dry on presentation and home CHF medications were held. - Echo in 07/2020 showed LVEF of >20%. RV normal in size and function with moderately elevated PASP. RHC in 08/2020 showed normal filling pressures with relatively preserved cardiac output and mild pulmonary arterial hypertension. Cardiopulmonary Exercise Test earlier this month showed severe functional impairment due to HF and deconditioning.  - Patient appears euvolemic on exam.  - Patient on Bumex 2mg  in AM and 1mg  in PM, Spironolactone 25mg  daily, Toprol-XL 50mg  daily, and Jardiance 10mg  daily at home.  All of these medications are currently on hold. Will check repeat BMET today to help guide which medications to add back slowly.  Hypertension - History of hypertension. However, patient now struggles mere with hypotension. Systolic BP in the 06'Y overnight. - See details for medications above.  Rheumatoid Arthritis - Continue home medications.  GERD - Continue PPI.  For questions or updates, please contact [place] Please consult [email or web address] for contact info under     Signed, Ena Dawley, MD  10/21/2020, 12:13 PM    The patient was seen, examined and discussed with Darreld Mclean, PA-C  and I agree with the above.   She remains hypotensive and in sinus rhythm with PVCs, ventricular rates in 70-80. We will switch to PO amiodarone 200 mg po daily, add digoxin 0.0625mg  po daily, despite GFR 22, we will monitor digoxin level closely, unable to add ARB or BB sec to hypotension in 80'. 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 We will ask CHF service for help - consideration of advanced therapies. Recent CPX showed severe VO2 max  impairment: 10.1 with high risk features: resting hypotension with blunted BP response to exercise, very high VEVCO2 response to exercise.  Ena Dawley, MD 10/21/2020

## 2020-10-22 ENCOUNTER — Other Ambulatory Visit (HOSPITAL_COMMUNITY): Payer: Medicare PPO

## 2020-10-22 ENCOUNTER — Other Ambulatory Visit: Payer: Self-pay | Admitting: Adult Health

## 2020-10-22 DIAGNOSIS — I959 Hypotension, unspecified: Secondary | ICD-10-CM

## 2020-10-22 DIAGNOSIS — R112 Nausea with vomiting, unspecified: Secondary | ICD-10-CM

## 2020-10-22 DIAGNOSIS — R0789 Other chest pain: Secondary | ICD-10-CM

## 2020-10-22 DIAGNOSIS — N183 Chronic kidney disease, stage 3 unspecified: Secondary | ICD-10-CM

## 2020-10-22 DIAGNOSIS — R5381 Other malaise: Secondary | ICD-10-CM

## 2020-10-22 HISTORY — DX: Other chest pain: R07.89

## 2020-10-22 LAB — BASIC METABOLIC PANEL
Anion gap: 9 (ref 5–15)
BUN: 43 mg/dL — ABNORMAL HIGH (ref 8–23)
CO2: 23 mmol/L (ref 22–32)
Calcium: 9 mg/dL (ref 8.9–10.3)
Chloride: 105 mmol/L (ref 98–111)
Creatinine, Ser: 1.8 mg/dL — ABNORMAL HIGH (ref 0.44–1.00)
GFR, Estimated: 29 mL/min — ABNORMAL LOW (ref >60)
Glucose, Bld: 88 mg/dL (ref 70–99)
Potassium: 3.7 mmol/L (ref 3.5–5.1)
Sodium: 137 mmol/L (ref 135–145)

## 2020-10-22 MED ORDER — AMIODARONE HCL 200 MG PO TABS
200.0000 mg | ORAL_TABLET | Freq: Every day | ORAL | 6 refills | Status: DC
Start: 2020-10-22 — End: 2020-10-31

## 2020-10-22 MED ORDER — POLYETHYLENE GLYCOL 3350 17 G PO PACK: 17.0000 g | PACK | Freq: Every day | ORAL | Status: DC

## 2020-10-22 MED ORDER — SENNOSIDES-DOCUSATE SODIUM 8.6-50 MG PO TABS: 1.0000 | ORAL_TABLET | Freq: Two times a day (BID) | ORAL | Status: DC

## 2020-10-22 MED ORDER — DIGOXIN 125 MCG PO TABS: 0.0625 mg | ORAL_TABLET | Freq: Every day | ORAL | 6 refills | Status: DC

## 2020-10-22 MED ORDER — APIXABAN 2.5 MG PO TABS: 2.5000 mg | ORAL_TABLET | Freq: Two times a day (BID) | ORAL | 6 refills | Status: DC

## 2020-10-22 NOTE — Progress Notes (Signed)
Outpatient Heart Failure CSW consulted by Heart Failure Team to add pt to [facility] for concerns with high readmission risk.  CSW discussed with pt who is agreeable to enrollment.  Paramedicine Initial Assessment:  Housing:  In what kind of housing do you live? House/apt/trailer/shelter? house  Do you live with anyone? Dtr, son in law, and 3 grandchildren- recently moved from [place])  Are you currently worried about losing your housing? no  Within the past 12 months have you ever stayed outside, in a car, tent, a shelter, or temporarily with someone? no  Within the past 12 months have you been unable to get utilities when it was really needed? no   Food:  Within the past 12 months were you ever worried that food would run out before you got money to buy more? no  Within the past 34months have you run out of food and didn't have money to buy more? no  Income:  What is your current source of income? 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 Not very hard  Do you have outstanding medical bills? no  Insurance:  Are you currently insured?  [employer] Medicare  Do you have prescription coverage? yes  If no insurance, have you applied for coverage (Medicaid, disability, marketplace etc)? n/a  Transportation:  Do you have transportation to your medical appointments? Is transported by family but they work during the day so sometimes hard to arrange to get to appts due to family's schedule.  In the past 12 months has lack of transportation kept you from medical appts or from getting medications? no  In the past 12 months has lack of transportation kept you from meetings, work, or getting things you needed? no   Daily Health Needs: Do you have a working scale at home? yes  Do you have issues affording your medications? Reports paying for  Vania Rea is a strain ($60/month)- also worried about new medication Eliquis  If yes, has this ever prevented you from obtaining medications? no  Do you have any concerns with mobility at home? No- but will be followed by [facility] [place] you have a PCP? No was trying to get in with dtrs PCP- Bryon Lions but was told that they aren't accepting patients at this time- will need to assist with pt getting PCP.  Do you have any trouble reading or writing? no  Are there any additional barriers you see to getting the care you need? no  CSW will continue to follow through paramedicine program and assist as needed.  Jorge Ny, LCSW Clinical Social Worker [facility] Desk#: [phone] Cell#: [phone]

## 2020-10-22 NOTE — Progress Notes (Signed)
Physical Therapy Treatment Patient Details Name: Tracy Nelson MRN: [id number] DOB: 1945/11/12 Today's Date: 10/22/2020    History of Present Illness Pt is a 75 y.o. female admitted 10/18/20 with several days of hypotension, weakness; found to be in aflutter, hypotensive. PMH includes HFrEF, HTN, CHF, DM2, CKD III, RA, osteoporosis, arthritis.   PT Comments    Pt seen for additional session for gait training with SPC and RW since pt preparing to d/c this afternoon. Pt's stability ultimately most improved with RW, as pt has difficulty sequencing with cane. All DME adjusted for pt's height, educ on correct use. Reviewed educ re: DME recs, fall risk reduction, activity recommendations, importance of mobility. Pt planning to continue with HHPT/OT services.   Follow Up Recommendations  Home health PT;Supervision for mobility/OOB     Equipment Recommendations  None recommended by PT (already delivered)    Recommendations for Other Services       Precautions / Restrictions Precautions Precautions: Fall Restrictions Weight Bearing Restrictions: No    Mobility  Bed Mobility Overal bed mobility: Independent                  Transfers Overall transfer level: Needs assistance Equipment used: None Transfers: Sit to/from Stand Sit to Stand: Supervision         General transfer comment: Multiple sit<>stands from EOB and shower seat with and without DME; cues for sequencing, supervision for safety  Ambulation/Gait Ambulation/Gait assistance: Supervision Gait Distance (Feet): 50 Feet Assistive device: None Gait Pattern/deviations: Step-through pattern;Decreased stride length;Trunk flexed Gait velocity: Decreased Gait velocity interpretation: <1.31 ft/sec, indicative of household ambulator General Gait Details: Pt ambulating without DME, intermittent min guard for balance at times reaching to furniture/walls for support; additional gait trial with SPC, pt with difficulty  performing correct sequencing despite verbal cues and demonstration; additional gait trial with RW, stability significantly improved   Stairs             Wheelchair Mobility    Modified Rankin (Stroke Patients Only)       Balance Overall balance assessment: Needs assistance   Sitting balance-Leahy Scale: Good Sitting balance - Comments: Indep to don bilateral shoes sitting EOb     Standing balance-Leahy Scale: Fair Standing balance comment: can stand and take steps without UE support; static and dynamic stability improved with UE support                            Cognition Arousal/Alertness: Awake/alert Behavior During Therapy: Flat affect Overall Cognitive Status: Within Functional Limits for tasks assessed                                 General Comments: WFL for simple tasks; not formally assessed      Exercises      General Comments General comments (skin integrity, edema, etc.): Pt's DME delivered to room, including RW, BSC, shower chair; all adjusted to pt's height and educ on use. Pt reports she plans to use BSC beside bed due to urinary frequency; educ on importance of continued mobility, including walking to/from bathroom instead of always using BSC; pt will likely benefit from [facility] use at night to reduce fall risk with urinary urgency/frequency. Pt asking about wanting a SPC too - educ that [facility] not currently helping pt's stability; since pt preparing to d/c this afternoon, could plan to practice using [place] with HHPT  Pertinent Vitals/Pain Pain Assessment: No/denies pain    Home Living                      Prior Function            PT Goals (current goals can now be found in the care plan section) Progress towards PT goals: Progressing toward goals    Frequency    Min 3X/week      PT Plan Current plan remains appropriate    Co-evaluation              AM-PAC PT "6 Clicks" Mobility   Outcome  Measure  Help needed turning from your back to your side while in a flat bed without using bedrails?: None Help needed moving from lying on your back to sitting on the side of a flat bed without using bedrails?: None Help needed moving to and from a bed to a chair (including a wheelchair)?: A Little Help needed standing up from a chair using your arms (e.g., wheelchair or bedside chair)?: A Little Help needed to walk in hospital room?: A Little Help needed climbing 3-5 steps with a railing? : A Lot 6 Click Score: 19    End of Session   Activity Tolerance: Patient tolerated treatment well;Patient limited by fatigue Patient left: in bed;with call bell/phone within reach Nurse Communication: Mobility status PT Visit Diagnosis: Other abnormalities of gait and mobility (R26.89);Muscle weakness (generalized) (M62.81)     Time: 8502-7741 PT Time Calculation (min) (ACUTE ONLY): 14 min  Charges:  $Gait Training: 8-22 mins                    Mabeline Caras, PT, DPT Acute Rehabilitation Services  Pager [phone] Office [place] 10/22/2020, 3:17 PM

## 2020-10-22 NOTE — Discharge Summary (Signed)
Discharge Summary    [name] MRN: [id number]; DOB: 1945/09/28  Admit date: 10/18/2020 Discharge date: 10/22/2020  PCP:  Patient, No Pcp Per   [place]  Cardiologist:  Loralie Champagne, MD Electrophysiologist:  None   Discharge Diagnoses    Principal Problem:   Atrial flutter [facility]) Active Problems:   Non-ischemic cardiomyopathy ([place])   HFrEF (heart failure with reduced ejection fraction) (HCC)   GERD (gastroesophageal reflux disease)   Rheumatoid arthritis ([place])   Hypotension   Atypical chest pain   CKD (chronic kidney disease), stage III ([place])   Nausea & vomiting   Physical deconditioning    Diagnostic Studies/Procedures    None _____________   History of Present Illness     Tracy Nelson is a 75 y.o. female with a history of no significant CAD on cardiac catheterization in 2015, non-ischemic cardiomyopathy with chronic systolic CHF and known EF of <20% on Echo in 07/2020, atrial flutter on Eliquis, rheumatoid arthritis, CKD stage III, GERD, and anxiety who was admitted on 10/19/2020 with new onset atrial flutter.  Patient is closely followed by Dr. Aundra Dubin in the [facility]. She was last seen by Dr. Aundra Dubin on 10/02/2020 at which time she was noted to have NYHA class III heart failure. Recent Echo in 07/2020 showed LVEF of 20% with moderate LV dilation. RHC in 08/2020 showed normal filling pressures and relatively preserved cardiac output. CPX was ordered at last visit and showed severe functional impairment due to HF and deconditioning. She is overall frail and has lost weight over the last 6 months. Since last visit, she reported condition has largely been the same. She denied any fevers, chills, weight gain, chest pain, worsening orthopnea, PND, lower extremity edema, or abdominal distension. She did report palpitations; however, this has reportedly been going on for a while.   On arrival to the ED, patient was found to be  in atrial flutter with rates in the 120's and hypotensive with systolic BP in the 09'G to 90's. Initial lab work showed normal WBC, Hgb 12.2, BNP 1,406 (stable from a few weeks ago), Cr 2.44. Patient was admitted for new onset atrial flutter and hypotension.  Hospital Course     Consultants: Advanced CHF Team (Dr. Haroldine Laws)   New Onset Atrial Flutter Patient was initially started on started on Metoprolol for rate control but was then switched to IV Amiodarone due to soft BP. Also started on Eliquis. Fortunately, patient converted back to sinus rhythm on Amiodarone. TSH and electrolytes were within normal limits. Will discharge patient on PO Amiodarone 200mg  daily and Eliquis 2.5mg  twice daily (reduced dose due to weight and renal function). Will stop Aspirin now that she is on DOAC. Patient was briefly started on Digoxin but this was ultimately stopped.  Hypotension Systolic BP in the 28'Z to 90's on admission. Home diuretics were held and patient was given IV fluids with improvement. WBC and lactic acid normal. BP stable on day of discharge. Advanced CHF team was consulted to assist with medications. Will restart home Bumex on day of discharge. However, will stop Toprol-XL, Spironolactone, and Jardiance.  Chronic Systolic CHF Non-Ischemic Cardiomyopathy Patient was initially diagnosed with non-ischemic cardiomyopathy back in 2015. LHC at that time showed normal coronaries and cardiac MRI showed no LGE. Most recent Echo in 07/2020 showed LVEF of <20% with normal RV. CPX in 09/2020 showed severe functional impairment due to CHF and deconditioning (peak VO2 10, slope 55, RER 1.13). She has been  intolerant to Entresto in the past due to hypotension. Patient dry to euvolemic during admission. Diuretics were initially held on admission. Will restart home Bumex 2mg  in the AM and 1mg  in the PM. However, home Toprol-XL, Spironolactone, and Jardiance was stopped. Continue Vericiguat. She was briefly started on  Digoxin but this was ultimately stopped. Advanced CHF team will help set up Paramedicine.   Atypical Chest Pain On day of discharge, patient complain of sharp left upper chest pain. However, very clearly chest wall pain with pinpoint tenderness with palpation of chest wall. No ischemic evaluation felt to be necessary. Of note, patient has severe anxiety and has had multiple somatic complaints during admission which may also be contributing.  Rheumatoid Arthritis Continue home medications (Prednisone, Plaquenil, and Imuran). Needs to get established with Rheumatology in the area.  GERD Continue PPI.  CKD Stage III Creatinine 2.4 on admission. Baseline around 2.0 to 2.2. Improved to 1.8 on day of discharge.   GERD Nausea/Vomiting Patient has history of GERD and GI problems with nausea at home. She felt nauseous on day of discharge and had episode of emesis and then nausea resolved. This occurred after multiple dose of Miralax and after she had a bowel movement. Son-in-law expressed concern over chronic GI issues and trouble swallowing. He was worried that her dysphagia may be causing dehydration which in turn caused atrial flutter. Patient already has an appointment to see GI as outpatient next month. Recommended keeping this appointment. Inpatient evaluation was not felt to be needed.  Deconditioned Patient severely deconditioned. She is already followed by home health PT. PT consulted and recommended continuation of home health PT.  Patient was seen and examined today by Dr. Meda Coffee and Dr. Haroldine Laws and determined to be stable for discharge. Outpatient follow-up has been arranged. Medications as below.  Did the patient have an acute coronary syndrome (MI, NSTEMI, STEMI, etc) this admission?:  No                               Did the patient have a percutaneous coronary intervention (stent / angioplasty)?:  No.       _____________  Discharge Vitals Blood pressure 98/77, pulse 80,  temperature 98.4 F (36.9 C), temperature source Oral, resp. rate 16, height 5\' 4"  (1.626 m), weight 49.6 kg, SpO2 98 %.  Filed Weights   10/20/20 0440 10/21/20 0429 10/22/20 0510  Weight: 50.6 kg 51 kg 49.6 kg   General: 75 y.o. frail female resting comfortably in no acute distress. HEENT: Normocephalic and atraumatic. Sclera clear.  Neck: Supple.  No JVD. Heart: RRR. Distinct S1 and S2. No murmurs, gallops, or rubs. Radial pulses 2+ and equal bilaterally. Lungs: No increased work of breathing. Clear to ausculation bilaterally. No wheezes, rhonchi, or rales.  Abdomen: Soft, non-distended, and non-tender to palpation. Bowel sounds present in all 4 quadrants.  MSK: Normal strength and tone for age. Extremities: No lower extremity edema.    Skin: Warm and dry. Neuro: Alert and oriented x3. No focal deficits. Psych: Normal affect. Responds appropriately.  Labs & Radiologic Studies    CBC No results for input(s): WBC, NEUTROABS, HGB, HCT, MCV, PLT in the last 72 hours. Basic Metabolic Panel Recent Labs    10/21/20 0852 10/22/20 0658  NA 135 137  K 3.6 3.7  CL 103 105  CO2 23 23  GLUCOSE 102* 88  BUN 52* 43*  CREATININE 2.24* 1.80*  CALCIUM 9.1 9.0  MG 2.4  --    Liver Function Tests No results for input(s): AST, ALT, ALKPHOS, BILITOT, PROT, ALBUMIN in the last 72 hours. No results for input(s): LIPASE, AMYLASE in the last 72 hours. High Sensitivity Troponin:   No results for input(s): TROPONINIHS in the last 720 hours.  BNP Invalid input(s): POCBNP D-Dimer No results for input(s): DDIMER in the last 72 hours. Hemoglobin A1C No results for input(s): HGBA1C in the last 72 hours. Fasting Lipid Panel No results for input(s): CHOL, HDL, LDLCALC, TRIG, CHOLHDL, LDLDIRECT in the last 72 hours. Thyroid Function Tests Recent Labs    10/21/20 0852  TSH 3.322   _____________  DG Chest Portable 1 View  Result Date: 10/18/2020 CLINICAL DATA:  Shortness of breath and  hypotension. EXAM: PORTABLE CHEST 1 VIEW COMPARISON:  Radiograph 08/29/2020 FINDINGS: Cardiac enlargement. Known dilated cardiomyopathy. The mediastinal and hilar contours are within normal limits. The lungs are clear. No pulmonary edema, pleural effusions or pulmonary infiltrates. Streaky left basilar scarring changes or atelectasis. No pulmonary lesions. The bony thorax is intact. IMPRESSION: 1. Cardiac enlargement. 2. No acute cardiopulmonary findings. Electronically Signed   By: Marijo Sanes M.D.   On: 10/18/2020 19:15   Disposition   Patient is being discharged home today in good condition.  Follow-up Plans & Appointments     Follow-up Information    [facility] Follow up on 11/05/2020.   Specialty: Cardiology Why: Follow-up visit in CHF Clinic scheduled for 11/05/2020 at 3:30. [place] [address]. Contact information: [address] [id number] [place], [place] Follow up.   Specialty: Home Health Services Why: Registered Nurse, Physical Therapy-office will call with visit times.  Contact information: [address] [place] [address] [phone]        [facility] Follow up.   Why: Rolling walker, bedside commode, shower chair-will be delivered to the room prior to discharge.   Contact information: [place] [address] [phone]              Discharge Instructions    (HEART FAILURE PATIENTS) Call MD:  Anytime you have any of the following symptoms: 1) 3 pound weight gain in 24 hours or 5 pounds in 1 week 2) shortness of breath, with or without a dry hacking cough 3) swelling in the hands, feet or stomach 4) if you have to sleep on extra pillows at night in order to breathe.   Complete by: As directed    Diet - low sodium heart healthy   Complete by: As directed    Less < 2 liters per day   Heart Failure patients  record your daily weight using the same scale at the same time of day   Complete by: As directed    Increase activity slowly   Complete by: As directed       Discharge Medications   Allergies as of 10/22/2020      Reactions   Baclofen Nausea And Vomiting   Penicillins Rash      Medication List    STOP taking these medications   aspirin EC 81 MG tablet   empagliflozin 10 MG Tabs tablet Commonly known as: Jardiance   metoprolol succinate 50 MG 24 hr tablet Commonly known as: Toprol XL   spironolactone 25 MG tablet Commonly known as: ALDACTONE     TAKE these medications  acetaminophen 500 MG tablet Commonly known as: TYLENOL Take 500-1,000 mg by mouth every 6 (six) hours as needed for moderate pain.   amiodarone 200 MG tablet Commonly known as: PACERONE Take 1 tablet (200 mg total) by mouth daily.   apixaban 2.5 MG Tabs tablet Commonly known as: ELIQUIS Take 1 tablet (2.5 mg total) by mouth 2 (two) times daily.   azaTHIOprine 50 MG tablet Commonly known as: Imuran Take 1 tablet (50 mg total) by mouth daily.   bumetanide 1 MG tablet Commonly known as: Bumex Take 2 mg in AM and 1 mg in PM   cholestyramine 4 g packet Commonly known as: Questran Take 1 packet (4 g total) by mouth daily.   Ensure Take 237 mLs by mouth daily.   ergocalciferol 1.25 MG (50000 UT) capsule Commonly known as: VITAMIN D2 Take 1 capsule (50,000 Units total) by mouth once a week.   folic acid 1 MG tablet Commonly known as: FOLVITE Take 1 tablet (1 mg total) by mouth daily.   hydroxychloroquine 200 MG tablet Commonly known as: Plaquenil Take 1 tablet (200 mg total) by mouth daily.   ibandronate 150 MG tablet Commonly known as: BONIVA Take 150 mg by mouth every 30 (thirty) days. Take in the morning with a full glass of water, on an empty stomach, and do not take anything else by mouth or lie down for the next 30 min.   ICY HOT BACK EX Apply 1 application topically daily as  needed (pain).   Mitigare 0.6 MG Caps Generic drug: Colchicine Take 0.6 mg by mouth daily as needed (gout).   multivitamin tablet Take 1 tablet by mouth daily.   omeprazole 40 MG capsule Commonly known as: PRILOSEC Take 40 mg by mouth daily.   polyethylene glycol 17 g packet Commonly known as: MIRALAX / GLYCOLAX Take 17 g by mouth as needed.   predniSONE 5 MG tablet Commonly known as: DELTASONE Take 1 tablet (5 mg total) by mouth daily with breakfast.   Restasis 0.05 % ophthalmic emulsion Generic drug: cycloSPORINE Place 1 drop into both eyes 2 (two) times daily.   Verquvo 10 MG Tabs Generic drug: Vericiguat Take 10 mg by mouth daily.            Durable Medical Equipment  (From admission, onward)         Start     Ordered   10/22/20 1332  For home use only DME Bedside commode  Once       Question:  Patient needs a bedside commode to treat with the following condition  Answer:  General weakness   10/22/20 1332   10/22/20 1330  For home use only DME Shower stool  Once        10/22/20 1332   10/22/20 1330  For home use only DME Walker rolling  Once       Question Answer Comment  Walker: With [place]   Patient needs a walker to treat with the following condition General weakness      10/22/20 1332   10/21/20 1444  Heart failure home health orders  (Heart failure home health orders / Face to face)  Once       Comments: Heart Failure Follow-up Care:  Verify follow-up appointments per Patient Discharge Instructions. Confirm transportation arranged. Reconcile home medications with discharge medication list. Remove discontinued medications from use. Assist patient/caregiver to manage medications using pill box. Reinforce low sodium food selection Assessments: Vital signs and oxygen saturation  at each visit. Assess home environment for safety concerns, caregiver support and availability of low-sodium foods. Consult Education officer, museum, PT/OT, Dietitian, and CNA based  on assessments. Perform comprehensive cardiopulmonary assessment. Notify MD for any change in condition or weight gain of 3 pounds in one day or 5 pounds in one week with symptoms. Daily Weights and Symptom Monitoring: Ensure patient has access to scales. Teach patient/caregiver to weigh daily before breakfast and after voiding using same scale and record.    Teach patient/caregiver to track weight and symptoms and when to notify Provider. Activity: Develop individualized activity plan with patient/caregiver.  [place]  Question Answer Comment  Heart Failure Follow-up Care Advanced Heart Failure (AHF) Clinic at [phone]   Lab frequency Other see comments   Fax lab results to AHF Clinic at [phone]   Diet Low Sodium Heart Healthy   Fluid restrictions: 2000 mL Fluid   Initiate Heart Failure Clinic Diuretic Protocol to be used by [place] only ( to be ordered by Heart Failure Team Providers Only) No      10/21/20 1445             Outstanding Labs/Studies   Recommend checking BMET at follow-up visit.  Duration of Discharge Encounter   Greater than 30 minutes including physician time.  Signed, Darreld Mclean, PA-C 10/22/2020, 2:02 PM

## 2020-10-22 NOTE — Care Management Important Message (Signed)
Important Message  Patient Details  Name: Tracy Nelson MRN: [id number] Date of Birth: 08-12-1945   Medicare Important Message Given:  Yes     Shelda Altes 10/22/2020, 9:15 AM

## 2020-10-22 NOTE — TOC Initial Note (Deleted)
Transition of Care [facility]) - Initial/Assessment Note    Patient Details  Name: Tracy Nelson MRN: [id number] Date of Birth: 31-May-1946  Transition of Care [facility]) CM/SW Contact:    [place], [place] Phone Number: 10/22/2020, 12:48 PM  Clinical Narrative:                 CSW spoke with patient at bedside, she needs a shower chair, walker and bedside commode she states she is active with [facility], HHPT/OT. Tracy Nelson reports that she does not have a PCP since moving from [place], [place]. CSW called [facility] to initiate [facility] [place] services and they reported they have already been working with the patient and will continue to work with her for PT and RN with education for heart failure. CSW with the help of the NCM called [employer] for the medical equipment and they will deliver to Tracy Nelson at bedside before she discharges. CSW notified Tracy Nelson's nurse who is aware not to discharge the patient until the medical equipment has arrived. The CSW informed the patient of this information. Tracy Nelson son-in-law will transport her home along with the medical equipment.       Patient Goals and CMS Choice:   [facility].        Expected Discharge Plan and Services           Expected Discharge Date: 10/22/20                                    Prior Living Arrangements/Services                       Activities of Daily Living Home Assistive Devices/Equipment: Grab bars in shower,Dentures (specify type) ADL Screening (condition at time of admission) Patient's cognitive ability adequate to safely complete daily activities?: Yes Is the patient deaf or have difficulty hearing?: No Does the patient have difficulty seeing, even when wearing glasses/contacts?: No Does the patient have difficulty concentrating, remembering, or making decisions?: No Patient able to express need for assistance with ADLs?: No Does the patient have difficulty  dressing or bathing?: No Independently performs ADLs?: Yes (appropriate for developmental age) Does the patient have difficulty walking or climbing stairs?: Yes Weakness of Legs: None Weakness of Arms/Hands: None  Permission Sought/Granted                  Emotional Assessment              Admission diagnosis:  Atrial flutter ([place]) [I48.92] Atrial flutter with rapid ventricular response ([place]) [I48.92] Hypotension, unspecified hypotension type [I95.9] Patient Active Problem List   Diagnosis Date Noted  . Atrial flutter ([place]) 10/18/2020  . Arthritis 07/15/2020  . Acid reflux 07/15/2020  . Colitis 07/15/2020  . Dilated cardiomyopathy ([place]) 07/15/2020  . HFrEF (heart failure with reduced ejection fraction) ([place]) 07/15/2020  . HTN (hypertension) 07/15/2020  . Osteoporosis 07/15/2020  . Palpitations 07/15/2020  . Rheumatoid arthritis ([place]) 07/15/2020   PCP:  Patient, No Pcp Per Pharmacy:   [id number] [place], [place] [address] Phone: [phone] Fax: [place], [place] [address] [place] [address] Phone: [phone] Fax: [phone]     Social Determinants of Health (SDOH) Interventions    Readmission Risk  Interventions No flowsheet data found.  Ngozi Alvidrez, MSW, [place] Heart Failure Social Worker

## 2020-10-22 NOTE — TOC Transition Note (Addendum)
Transition of Care [facility]) - CM/SW Discharge Note Heart Failure   Patient Details  Name: Tracy Nelson MRN: [id number] Date of Birth: 01/17/1946  Transition of Care [facility]) CM/SW Contact:  [place], [place] Phone Number: 10/22/2020, 1:37 PM   Clinical Narrative:    CSW spoke with patient at bedside, she needs a shower chair, walker and bedside commode she states she is active with [facility], HHPT/OT. [name] reports that she does not have a PCP since moving from [place], [place]. CSW called [facility] to initiate [facility] [place] services and they reported they have already been working with the patient and will continue to work with her for PT and RN with education for heart failure. CSW with the help of the NCM called [employer] for the medical equipment and they will deliver to [name] at bedside before she discharges. CSW notified [name]'s nurse who is aware not to discharge the patient until the medical equipment has arrived. The CSW informed the patient of this information. [name] son-in-law will transport her home along with the medical equipment.    Final next level of care: Home/Self Care Barriers to Discharge: Barriers Resolved   Patient Goals and CMS Choice Patient states their goals for this hospitalization and ongoing recovery are:: To return home and feel better   Choice offered to / list presented to : NA  Discharge Placement                  Name of family member notified: patient alerting family of d/c Patient and family notified of of transfer: 10/22/20  Discharge Plan and Services In-house Referral: Clinical Social Work Discharge Planning Services: CM Consult Post Acute Care Choice: Home Health          DME Arranged: Shower stool,Walker,Bedside commode DME Agency: [employer] Date DME Agency Contacted: 10/22/20 Time DME Agency Contacted: 1205 Representative spoke with at DME Agency: [employer] [place]: RN,PT [place] Agency: [facility]  (formerly [employer]) Date [place]: 10/22/20 Time [place]: 1159 Representative spoke with at [place]: [place]  Social Determinants of Health ([place]) Interventions     Readmission Risk Interventions No flowsheet data found.   Zaniel Marineau, MSW, [place] Heart Failure Social Worker

## 2020-10-22 NOTE — Discharge Instructions (Signed)
Medication Changes: - STOP Aspirin now that you are on Eliquis.  - STOP Metoprolol succinate (Toprol-XL). - STOP Jardiance. - STOP Spironolactone. - START Amiodarone 200mg  daily. - START Eliquis 2.5mg  twice daily.   Continue all other home medications as listed elsewhere on discharge summary.  Information on my medicine - ELIQUIS (apixaban)  This medication education was reviewed with me or my healthcare representative as part of my discharge preparation.   Why was Eliquis prescribed for you? Eliquis was prescribed for you to reduce the risk of a blood clot forming that can cause a stroke if you have a medical condition called atrial fibrillation (a type of irregular heartbeat).  What do You need to know about Eliquis ? Take your Eliquis TWICE DAILY - one tablet in the morning and one tablet in the evening with or without food. If you have difficulty swallowing the tablet whole please discuss with your pharmacist how to take the medication safely.  Take Eliquis exactly as prescribed by your doctor and DO NOT stop taking Eliquis without talking to the doctor who prescribed the medication.  Stopping may increase your risk of developing a stroke.  Refill your prescription before you run out.  After discharge, you should have regular check-up appointments with your healthcare provider that is prescribing your Eliquis.  In the future your dose may need to be changed if your kidney function or weight changes by a significant amount or as you get older.  What do you do if you miss a dose? If you miss a dose, take it as soon as you remember on the same day and resume taking twice daily.  Do not take more than one dose of ELIQUIS at the same time to make up a missed dose.  Important Safety Information A possible side effect of Eliquis is bleeding. You should call your healthcare provider right away if you experience any of the following: ? Bleeding from an injury or your nose that does  not stop. ? Unusual colored urine (red or dark brown) or unusual colored stools (red or black). ? Unusual bruising for unknown reasons. ? A serious fall or if you hit your head (even if there is no bleeding).  Some medicines may interact with Eliquis and might increase your risk of bleeding or clotting while on Eliquis. To help avoid this, consult your healthcare provider or pharmacist prior to using any new prescription or non-prescription medications, including herbals, vitamins, non-steroidal anti-inflammatory drugs (NSAIDs) and supplements.  This website has more information on Eliquis (apixaban): http://www.eliquis.com/eliquis/home

## 2020-10-22 NOTE — Progress Notes (Addendum)
Advanced Heart Failure Rounding Note  PCP-Cardiologist: No primary care provider on file.   Subjective:   Yesterday started on digoxin.   Creatinine trending down 2.4>1.8   Denies SOB.    Objective:   Weight Range: 49.6 kg Body mass index is 18.78 kg/m.   Vital Signs:   Temp:  [97.6 F (36.4 C)-98.4 F (36.9 C)] 98.4 F (36.9 C) (03/15 0851) Pulse Rate:  [75-86] 79 (03/15 0851) Resp:  [14-16] 16 (03/15 0851) BP: (98-112)/(71-91) 98/77 (03/15 0851) SpO2:  [98 %-100 %] 98 % (03/15 0851) Weight:  [49.6 kg] 49.6 kg (03/15 0510) Last BM Date: 10/19/20  Weight change: Filed Weights   10/20/20 0440 10/21/20 0429 10/22/20 0510  Weight: 50.6 kg 51 kg 49.6 kg    Intake/Output:   Intake/Output Summary (Last 24 hours) at 10/22/2020 0934 Last data filed at 10/22/2020 0500 Gross per 24 hour  Intake 1117.12 ml  Output 850 ml  Net 267.12 ml      Physical Exam    General:  [place].  No resp difficulty HEENT: Normal Neck: Supple. JVP 6-7  Carotids 2+ bilat; no bruits. No lymphadenopathy or thyromegaly appreciated. Cor: PMI nondisplaced. Regular rate & rhythm. No rubs, gallops or murmurs. Lungs: Clear Abdomen: Soft, nontender, nondistended. No hepatosplenomegaly. No bruits or masses. Good bowel sounds. Extremities: No cyanosis, clubbing, rash, edema Neuro: Alert & orientedx3, cranial nerves grossly intact. moves all 4 extremities w/o difficulty. Affect pleasant   Telemetry   SR 70-80s with occasional PVCs.   EKG    n/a  Labs    CBC No results for input(s): WBC, NEUTROABS, HGB, HCT, MCV, PLT in the last 72 hours. Basic Metabolic Panel Recent Labs    10/21/20 0852 10/22/20 0658  NA 135 137  K 3.6 3.7  CL 103 105  CO2 23 23  GLUCOSE 102* 88  BUN 52* 43*  CREATININE 2.24* 1.80*  CALCIUM 9.1 9.0  MG 2.4  --    Liver Function Tests No results for input(s): AST, ALT, ALKPHOS, BILITOT, PROT, ALBUMIN in the last 72 hours. No results for input(s):  LIPASE, AMYLASE in the last 72 hours. Cardiac Enzymes No results for input(s): CKTOTAL, CKMB, CKMBINDEX, TROPONINI in the last 72 hours.  BNP: BNP (last 3 results) Recent Labs    08/08/20 1212 10/02/20 1600 10/18/20 1833  BNP 2,904.0* 1,385.2* 1,406.2*    ProBNP (last 3 results) No results for input(s): PROBNP in the last 8760 hours.   D-Dimer No results for input(s): DDIMER in the last 72 hours. Hemoglobin A1C No results for input(s): HGBA1C in the last 72 hours. Fasting Lipid Panel No results for input(s): CHOL, HDL, LDLCALC, TRIG, CHOLHDL, LDLDIRECT in the last 72 hours. Thyroid Function Tests Recent Labs    10/21/20 0852  TSH 3.322    Other results:   Imaging     No results found.   Medications:     Scheduled Medications: . amiodarone  200 mg Oral BID  . apixaban  2.5 mg Oral BID  . azaTHIOprine  50 mg Oral Daily  . cholestyramine  4 g Oral Q24H  . cycloSPORINE  1 drop Both Eyes BID  . digoxin  0.0625 mg Oral Daily  . folic acid  1 mg Oral Daily  . hydroxychloroquine  200 mg Oral Daily  . melatonin  3 mg Oral Once  . pantoprazole  40 mg Oral Daily  . predniSONE  5 mg Oral Q breakfast  . Vericiguat  10  mg Oral Daily     Infusions:   PRN Medications:  acetaminophen, ondansetron (ZOFRAN) IV, polyethylene glycol     Assessment/Plan  1. Hypotension  - On admit SBP  ~80s and in A flutter RVR.   - Diuretics held. Given IV fluids with improvement. Recently started SGT2i as an outpatient not sure we can restart.  - Lactic Acid was not elevated. WBC 9.6  - Stable today.    2. A Flutter RVR -On admit. Chemically converted with amiodarone.  -In NSR  - Continue po amiodarone.  -TSH 3.3  - Continue eliquis 2.5 mg twice a day (lower dose with elevated creatinine and weight )  3. Chronic Systolic Heart Failure  -NICM dating back to 2015. LHC with 2015 with normal cors. Had CMRI 2015 that showed no LGE -Echo 07/2020 EF < 20% RV normal  -CPX  Peak VO2 10, slope 55, RER 1.13. --> Not sure she will be Advanced Therapy candidate.  - No bb with hypotension. BP has been low for some time.  -Restart bumex 3 mg twice a day.   -Started on digoxin earlier today 0.0625 mg  - Failed entresto in past due to hypotension.  - Continue vericguat - Hold off on spiro.  - Would hold off on SGT2i  3. CKD Stage IIIb -Creatinine baseline 2~2.2  -Creatinine on admit 2.4  - Creatinine down to 1.8 today.   4. RA -No history of lung involvement.  - On chronic prednisone, hdroxychloroquine, + azathioprine.  - Continue protonix. - Needs to establish with Rheumatology   5. Deconditioning Consult PT. Followed by HHPT prior to admit.   Home today. HF follow up set up.     Length of Stay: 4  Amy Clegg, NP  10/22/2020, 9:34 AM  Advanced Heart Failure Team Pager [phone] (M-F; 7a - 5p)  Please contact [facility] for night-coverage after hours (4p -7a ) and weekends on [email or web address]  Patient seen and examined with the above-signed Advanced Practice Provider and/or Housestaff. I personally reviewed laboratory data, imaging studies and relevant notes. I independently examined the patient and formulated the important aspects of the plan. I have edited the note to reflect any of my changes or salient points. I have personally discussed the plan with the patient and/or family.  Continues with multiple somatic complaints. Denies SOB, orthopnea or PND. Remains in NSR.   General:  Elderly woman. Lying flat. No resp difficulty HEENT: normal Neck: supple. no JVD. Carotids 2+ bilat; no bruits. No lymphadenopathy or thryomegaly appreciated. Cor: PMI nondisplaced. Regular rate & rhythm. No rubs, gallops or murmurs. Lungs: clear Abdomen: soft, nontender, nondistended. No hepatosplenomegaly. No bruits or masses. Good bowel sounds. Extremities: no cyanosis, clubbing, rash, edema + RA changes Neuro: alert & orientedx3, cranial nerves grossly intact. moves  all 4 extremities w/o difficulty. Affect pleasant  She is maintaining NSR. Volume status looks good. Agree with continuing amio and eliquis. Would not use digoxin. I worry that she has severe anxiety and is at high risk for readmissions. Will try to arrange Paramedicine f/u.   Glori Bickers, MD  12:40 PM

## 2020-10-22 NOTE — Evaluation (Signed)
Physical Therapy Evaluation Patient Details Name: Tracy Nelson MRN: [id number] DOB: 1946-04-19 Today's Date: 10/22/2020   History of Present Illness  Pt is a 75 y.o. female admitted 10/18/20 with several days of hypotension, weakness; found to be in aflutter, hypotensive. PMH includes HFrEF, HTN, CHF, DM2, CKD III, RA, osteoporosis, arthritis.    Clinical Impression  Pt presents with an overall decrease in functional mobility secondary to above. PTA, pt performs household ambulation by 'furniture surfing' and uses HHA from family in community; lives with daughter's family who assist with household tasks. Today, pt able to perform short activity bout with min guard for balance; limited by fatigue, denies dizziness or chest pain. Pt declined gait training with SPC this session. Pt would benefit from continued acute PT services to maximize functional mobility and independence prior to d/c with continued HHPT services. Noted plan for pt to d/c home today; will have necessary assist from family.  HR 90s Resting BP 98/77 Post-mobility BP 93/65    Follow Up Recommendations Home health PT;Supervision for mobility/OOB    Equipment Recommendations   (TBD)    Recommendations for Other Services       Precautions / Restrictions Precautions Precautions: Fall;Other (comment) Precaution Comments: hypotensive (seems stable) Restrictions Weight Bearing Restrictions: No      Mobility  Bed Mobility Overal bed mobility: Independent             General bed mobility comments: Supine<>sit indep with HOB slightly elevated    Transfers Overall transfer level: Needs assistance Equipment used: None Transfers: Sit to/from Stand Sit to Stand: Supervision         General transfer comment: Pt states it takes her 'a while' to stand  Ambulation/Gait Ambulation/Gait assistance: Min guard Gait Distance (Feet): 24 Feet Assistive device: None Gait Pattern/deviations: Step-through  pattern;Decreased stride length;Trunk flexed Gait velocity: Decreased   General Gait Details: Slow, guarded gait with min guard for balance, pt intermittently reaching to furniture/wall for UE support; distance limited by fatigue, pt declined additional trial with DME  Stairs            Wheelchair Mobility    Modified Rankin (Stroke Patients Only)       Balance Overall balance assessment: Needs assistance   Sitting balance-Leahy Scale: Good       Standing balance-Leahy Scale: Fair Standing balance comment: can stand and take steps without UE support; static and dynamic stability improved with single UE support                             Pertinent Vitals/Pain Pain Assessment: No/denies pain    Home Living Family/patient expects to be discharged to:: Private residence Living Arrangements: Children Available Help at Discharge: Family;Available PRN/intermittently Type of Home: House Home Access: Level entry     Home Layout: Two level;Able to live on main level with bedroom/bathroom Home Equipment: Grab bars - toilet;Grab bars - tub/shower Additional Comments: Lives with daughter, son-in-law and grandson - gone during the day. Pt wears medical alert necklace    Prior Function Level of Independence: Independent;Needs assistance   Gait / Transfers Assistance Needed: Reports using "walls" to ambulate. Will hold onto family's arms for outside ambulation. Pt reports, "I don't do stairs anymore"  ADL's / Homemaking Assistance Needed: Reports mod indep with ADLs, standing for showers. Daughter assists with household tasks, including cooking, cleaning  Comments: Currently working with [employer]. Reports she could benefit from a shower seat  but HHPT told her insurance would not cover     Hand Dominance   Dominant Hand: Right    Extremity/Trunk Assessment   Upper Extremity Assessment Upper Extremity Assessment: Generalized weakness    Lower Extremity  Assessment Lower Extremity Assessment: Generalized weakness    Cervical / Trunk Assessment Cervical / Trunk Assessment: Kyphotic  Communication   Communication: No difficulties  Cognition Arousal/Alertness: Awake/alert Behavior During Therapy: Flat affect Overall Cognitive Status: Within Functional Limits for tasks assessed                                        General Comments General comments (skin integrity, edema, etc.): HR 90s; resting BP 98/77, post-mobility BP 93/65; pt denies dizziness with ambulation, endorses fatigue    Exercises     Assessment/Plan    PT Assessment Patient needs continued PT services  PT Problem List Decreased strength;Decreased activity tolerance;Decreased balance;Decreased mobility;Cardiopulmonary status limiting activity       PT Treatment Interventions DME instruction;Gait training;Functional mobility training;Therapeutic activities;Therapeutic exercise;Balance training;Patient/family education    PT Goals (Current goals can be found in the Care Plan section)  Acute Rehab PT Goals Patient Stated Goal: home today; continue working with [facility] therapies PT Goal Formulation: With patient Time For Goal Achievement: 11/05/20 Potential to Achieve Goals: Good    Frequency Min 3X/week   Barriers to discharge        Co-evaluation               AM-PAC PT "6 Clicks" Mobility  Outcome Measure Help needed turning from your back to your side while in a flat bed without using bedrails?: None Help needed moving from lying on your back to sitting on the side of a flat bed without using bedrails?: None Help needed moving to and from a bed to a chair (including a wheelchair)?: A Little Help needed standing up from a chair using your arms (e.g., wheelchair or bedside chair)?: A Little Help needed to walk in hospital room?: A Little Help needed climbing 3-5 steps with a railing? : A Lot 6 Click Score: 19    End of Session    Activity Tolerance: Patient limited by fatigue Patient left: in bed;with call bell/phone within reach Nurse Communication: Mobility status PT Visit Diagnosis: Other abnormalities of gait and mobility (R26.89);Muscle weakness (generalized) (M62.81)    Time: 9758-8325 PT Time Calculation (min) (ACUTE ONLY): 25 min   Charges:   PT Evaluation $PT Eval Moderate Complexity: 1 Mod     Mabeline Caras, PT, DPT Acute Rehabilitation Services  Pager [phone] Office [place] 10/22/2020, 10:18 AM

## 2020-10-23 ENCOUNTER — Telehealth (HOSPITAL_COMMUNITY): Payer: Self-pay | Admitting: Licensed Clinical Social Worker

## 2020-10-23 NOTE — Telephone Encounter (Signed)
CSW called pt to discuss getting PCP established-unable to reach- left VM requesting return call  Tracy Nelson, [facility] Desk#: [phone] Cell#: [phone]

## 2020-10-25 ENCOUNTER — Telehealth (HOSPITAL_COMMUNITY): Payer: Self-pay

## 2020-10-25 NOTE — Telephone Encounter (Signed)
Called patient to see if she was interested in participating in the Cardiac Rehab Program. Patient stated yes. Patient will come in for orientation on 12/05/20 @ 215PM and will attend the 315PM exercise class.  Tourist information centre manager.

## 2020-10-28 ENCOUNTER — Other Ambulatory Visit: Payer: Self-pay | Admitting: Nephrology

## 2020-10-28 DIAGNOSIS — N184 Chronic kidney disease, stage 4 (severe): Secondary | ICD-10-CM

## 2020-10-31 ENCOUNTER — Telehealth (HOSPITAL_COMMUNITY): Payer: Self-pay | Admitting: Licensed Clinical Social Worker

## 2020-10-31 ENCOUNTER — Telehealth (HOSPITAL_COMMUNITY): Payer: Self-pay | Admitting: Adult Health

## 2020-10-31 ENCOUNTER — Other Ambulatory Visit (HOSPITAL_COMMUNITY): Payer: Self-pay

## 2020-10-31 MED ORDER — AMIODARONE HCL 200 MG PO TABS: 200.0000 mg | ORAL_TABLET | Freq: Two times a day (BID) | ORAL | 6 refills | Status: DC

## 2020-10-31 NOTE — Telephone Encounter (Signed)
CSW followed up with pt this morning to discuss going to [facility] where her daughter is seen.  CSW left message with scheduling line.  Community Paramedic completed first home visit today and pt expressed concerns with getting to ultrasound appt- CSW able to request ride through [employer]- pt informed and provided with [employer] number.  Will continue to follow and assist as needed  Jorge Ny, [facility] Desk#: [phone] Cell#: [phone]

## 2020-10-31 NOTE — Progress Notes (Signed)
Paramedicine Encounter    [name], female    DOB: 09-19-1945, 75 y.o.   MRN: [id number]   Patient Care Team: Patient, No Pcp Per as PCP - General (General Practice)  Patient Active Problem List   Diagnosis Date Noted  . Hypotension 10/22/2020  . Atypical chest pain 10/22/2020  . CKD (chronic kidney disease), stage III ([place]) 10/22/2020  . Nausea & vomiting 10/22/2020  . Physical deconditioning 10/22/2020  . Atrial flutter ([place]) 10/18/2020  . Arthritis 07/15/2020  . GERD (gastroesophageal reflux disease) 07/15/2020  . Colitis 07/15/2020  . Non-ischemic cardiomyopathy ([place]) 07/15/2020  . HFrEF (heart failure with reduced ejection fraction) ([place]) 07/15/2020  . HTN (hypertension) 07/15/2020  . Osteoporosis 07/15/2020  . Palpitations 07/15/2020  . Rheumatoid arthritis ([place]) 07/15/2020    Current Outpatient Medications:  .  acetaminophen (TYLENOL) 500 MG tablet, Take 500-1,000 mg by mouth every 6 (six) hours as needed for moderate pain., Disp: , Rfl:  .  amiodarone (PACERONE) 200 MG tablet, Take 1 tablet (200 mg total) by mouth daily., Disp: 30 tablet, Rfl: 6 .  apixaban (ELIQUIS) 2.5 MG TABS tablet, Take 1 tablet (2.5 mg total) by mouth 2 (two) times daily., Disp: 60 tablet, Rfl: 6 .  azaTHIOprine (IMURAN) 50 MG tablet, Take 1 tablet (50 mg total) by mouth daily., Disp: 30 tablet, Rfl: 3 .  bumetanide (BUMEX) 1 MG tablet, Take 2 mg in AM and 1 mg in PM, Disp: 180 tablet, Rfl: 3 .  cholestyramine (QUESTRAN) 4 g packet, Take 1 packet (4 g total) by mouth daily., Disp: 60 each, Rfl: 12 .  cycloSPORINE (RESTASIS) 0.05 % ophthalmic emulsion, Place 1 drop into both eyes 2 (two) times daily., Disp: 1.5 mL, Rfl: 3 .  Ensure (ENSURE), Take 237 mLs by mouth daily., Disp: , Rfl:  .  ergocalciferol (VITAMIN D2) 1.25 MG (50000 UT) capsule, Take 1 capsule (50,000 Units total) by mouth once a week., Disp: 10 capsule, Rfl: 3 .  folic acid (FOLVITE) 1 MG tablet, Take 1 tablet (1 mg  total) by mouth daily., Disp: 30 tablet, Rfl: 3 .  hydroxychloroquine (PLAQUENIL) 200 MG tablet, Take 1 tablet (200 mg total) by mouth daily., Disp: 30 tablet, Rfl: 3 .  Menthol, Topical Analgesic, (ICY HOT BACK EX), Apply 1 application topically daily as needed (pain)., Disp: , Rfl:  .  MITIGARE 0.6 MG CAPS, Take 0.6 mg by mouth daily as needed (gout)., Disp: , Rfl:  .  Multiple Vitamin (MULTIVITAMIN) tablet, Take 1 tablet by mouth daily., Disp: , Rfl:  .  omeprazole (PRILOSEC) 40 MG capsule, Take 40 mg by mouth daily., Disp: , Rfl:  .  polyethylene glycol (MIRALAX / GLYCOLAX) 17 g packet, Take 17 g by mouth as needed., Disp: , Rfl:  .  predniSONE (DELTASONE) 5 MG tablet, Take 1 tablet (5 mg total) by mouth daily with breakfast., Disp: 30 tablet, Rfl: 3 .  Vericiguat (VERQUVO) 10 MG TABS, Take 10 mg by mouth daily., Disp: 30 tablet, Rfl: 11 .  ibandronate (BONIVA) 150 MG tablet, Take 150 mg by mouth every 30 (thirty) days. Take in the morning with a full glass of water, on an empty stomach, and do not take anything else by mouth or lie down for the next 30 min. (Patient not taking: Reported on 10/31/2020), Disp: , Rfl:  Allergies  Allergen Reactions  . Baclofen Nausea And Vomiting  . Penicillins Rash      Social History   Socioeconomic History  .  Marital status: Widowed    Spouse name: Not on file  . Number of children: Not on file  . Years of education: Not on file  . Highest education level: Not on file  Occupational History  . Not on file  Tobacco Use  . Smoking status: Never Smoker  . Smokeless tobacco: Never Used  Substance and Sexual Activity  . Alcohol use: Never  . Drug use: Never  . Sexual activity: Not on file  Other Topics Concern  . Not on file  Social History Narrative  . Not on file   Social Determinants of Health   Financial Resource Strain: Low Risk   . Difficulty of Paying Living Expenses: Not very hard  Food Insecurity: No Food Insecurity  . Worried  About Charity fundraiser in the Last Year: Never true  . Ran Out of Food in the Last Year: Never true  Transportation Needs: No Transportation Needs  . Lack of Transportation (Medical): No  . Lack of Transportation (Non-Medical): No  Physical Activity: Not on file  Stress: Not on file  Social Connections: Not on file  Intimate Partner Violence: Not on file    Physical Exam      Future Appointments  Date Time Provider [place]  11/05/2020  3:30 PM [facility] PA/NP [facility] None  11/06/2020  4:15 PM [facility] [place] 5 [facility]  11/12/2020  3:20 PM [name], MD MC-HVSC None  11/29/2020  8:30 AM Milus Banister, MD [facility]-GI [facility]  12/05/2020  2:15 PM MC-CARDIAC PHASE II ORIENT MC-REHSC None  12/09/2020  3:15 PM MC-[facility] PHASE II EXC MC-REHSC None  12/11/2020  3:15 PM [facility] PHASE II EXC MC-REHSC None  12/13/2020  3:15 PM [facility] PHASE II EXC MC-REHSC None  12/16/2020  3:15 PM MC-[facility] PHASE II EXC MC-REHSC None  12/18/2020  3:15 PM [facility] PHASE II EXC MC-REHSC None  12/20/2020  3:15 PM MC-[facility] PHASE II EXC MC-REHSC None  12/23/2020  3:15 PM MC-[facility] PHASE II EXC MC-REHSC None  12/25/2020  3:15 PM MC-[facility] PHASE II EXC MC-REHSC None  12/27/2020  3:15 PM MC-[facility] PHASE II EXC MC-REHSC None  12/30/2020  3:15 PM [facility] PHASE II EXC MC-REHSC None  01/01/2021  3:15 PM MC-[facility] PHASE II EXC MC-REHSC None  01/03/2021  3:15 PM MC-CRE[facility] PHASE II EXC MC-REHSC None  01/08/2021  3:15 PM MC-CRE[facility] PHASE II EXC MC-REHSC None  01/10/2021  3:15 PM MC-CRE[facility] PHASE II EXC MC-REHSC None  01/13/2021  3:15 PM MC-CRE[facility] PHASE II EXC MC-REHSC None  01/15/2021  3:15 PM MC-CRE[facility] PHASE II EXC MC-REHSC None  01/17/2021  3:15 PM MC-CRE[facility] PHASE II EXC MC-REHSC None  01/20/2021  3:15 PM MC-CRE[facility] PHASE II EXC MC-REHSC None  01/22/2021  3:15 PM MC-CRE[facility] PHASE II EXC MC-REHSC None  01/24/2021  3:15 PM MC-CREHA PHASE II EXC MC-REHSC None  01/27/2021  3:15 PM MC-CRE[facility] PHASE II EXC MC-REHSC None  01/29/2021  3:15 PM  MC-CRE[facility] PHASE II EXC MC-REHSC None  01/31/2021  3:15 PM MC-[facility] PHASE II EXC MC-REHSC None    BP (!) 80/50   Pulse (!) 54   Resp 18   Wt 104 lb (47.2 kg)   SpO2 98%   BMI 17.85 kg/m   Weight yesterday-104 Last visit weight-102 @ clinic  Pt reports she is doing ok, just moving slow which about is usual for her.  She does have HHN but likely will be done in may and that is b/c dr Aundra Dubin wants her to  do outpt rehab once [facility] is done.  Pt lives with her daughter and family, she was living out [place] and when she got sick they insisted she move in with them.  They are her form of transportation for most of her appointments, but its hard for them sometimes. I advised her of the cone transport service available for her as well if needed.  Pt weighs most daily. She has good support system at home.  Her appetite is improving. Drinks ensure BID.  She reports her breathing is ok.  She is limiting her sodium intake.  [facility]. She denies any other issues with co-pays  Except the eliquis. Got the application assistance form signed and will return to [place].   She has not had her meds yet today, her bell appears distended. Her radial pulse is around 54 and I heard b/p of 80/50--I got monitor and did 12 lead and her EKG is rate of 118 showing afib/flutter and her b/p on that was initially reading high at 146/102 but then after a few more min it was 106/84. She denies c/p, she dont always feel the palpitations. She did get sudden episode of short of breath across her belly.  increased is for amio 200mg  BID. She will start that. I wrote new instructions on the bottle for her.  Advised her to take this until the next f/u next week.    Marylouise Stacks, [facility] Paramedic  10/31/20

## 2020-10-31 NOTE — Telephone Encounter (Signed)
Received call from [facility] with HF Paramedicine.   Ms Tracy Nelson  Recently hospitalized and had A flutter. Converted to NSR with amiodarone and did not require cardioversion.   EKG at home shows A flutter rate low 100s.  She has not had amio today. I have instructed to increase amio to 200 mg twice a day. She has been taking eliquis twice a day.   She has follow up next week in the HF clinic.   Selia Wareing NP-C  12:57 PM

## 2020-11-01 ENCOUNTER — Telehealth: Payer: Self-pay | Admitting: General Practice

## 2020-11-01 ENCOUNTER — Telehealth (HOSPITAL_COMMUNITY): Payer: Self-pay | Admitting: Licensed Clinical Social Worker

## 2020-11-01 NOTE — Telephone Encounter (Signed)
   Tracy Nelson DOB: Sep 17, 1945 MRN: [id number]   RIDER WAIVER AND RELEASE OF LIABILITY  For purposes of improving physical access to our facilities, [place] [facility] is pleased to partner with third parties to provide [facility] patients or other authorized individuals the option of convenient, on-demand ground transportation services (the [place]") through use of the technology service that enables users to request on-demand ground transportation from independent third-party providers.  By opting to use and accept these [employer], I, the undersigned, hereby agree on behalf of myself, and on behalf of any minor child using the [employer] for whom I am the parent or legal guardian, as follows:  1. Government social research officer provided to me are provided by independent third-party transportation providers who are not [employer] or employees and who are unaffiliated with [employer]. 2. [place] is neither a transportation carrier nor a common or public carrier. 3. [place] has no control over the quality or safety of the transportation that occurs as a result of the [employer]. 4. [place] cannot guarantee that any third-party transportation provider will complete any arranged transportation service. 5. [place] makes no representation, warranty, or guarantee regarding the reliability, timeliness, quality, safety, suitability, or availability of any of the Transport Services or that they will be error free. 6. I fully understand that traveling by vehicle involves risks and dangers of serious bodily injury, including permanent disability, paralysis, and death. I agree, on behalf of myself and on behalf of any minor child using the Transport Services for whom I am the parent or legal guardian, that the entire risk arising out of my use of the [employer] remains solely with me, to the maximum extent permitted under applicable law. 7. The [employer] are provided "as is" and "as available." [place] disclaims all representations and warranties, express, implied or statutory, not expressly set out in these terms, including the implied warranties of merchantability and fitness for a particular purpose. 8. I hereby waive and release [place], its agents, employees, officers, directors, representatives, insurers, attorneys, assigns, successors, subsidiaries, and affiliates from any and all past, present, or future claims, demands, liabilities, actions, causes of action, or suits of any kind directly or indirectly arising from acceptance and use of the [employer]. 9. I further waive and release [place] and its affiliates from all present and future liability and responsibility for any injury or death to persons or damages to property caused by or related to the use of the [employer]. 10. I have read this Waiver and Release of Liability, and I understand the terms used in it and their legal significance. This Waiver is freely and voluntarily given with the understanding that my right (as well as the right of any minor child for whom I am the parent or legal guardian using the [employer]) to legal recourse against  in connection with the [employer] is knowingly surrendered in return for use of these services.   I attest that I read the consent document to [name], gave [name] the opportunity to ask questions and answered the questions asked (if any). I affirm that [name] then provided consent for she's participation in this program.     Legrand Pitts

## 2020-11-01 NOTE — Telephone Encounter (Signed)
Community paramedic brought in pt BMS application for Eliquis assistance.  MD portion completed and signed.  Faxed to BMS for review- fax confirmation received- awaiting determination  Jorge Ny, [facility] Desk#: [phone] Cell#: [phone]

## 2020-11-04 ENCOUNTER — Telehealth (HOSPITAL_COMMUNITY): Payer: Self-pay | Admitting: Licensed Clinical Social Worker

## 2020-11-04 ENCOUNTER — Inpatient Hospital Stay (HOSPITAL_COMMUNITY): Admission: RE | Admit: 2020-11-04 | Payer: Medicare PPO | Source: Ambulatory Visit

## 2020-11-04 NOTE — Telephone Encounter (Signed)
CSW called [facility] and was able to get pt set up with new PCP appt for 4/7 at 8:30am  CSW provided details of appt to pt.  She will check with family about getting a ride to that appt but has the [employer] number if family cannot provide a ride and she needs help getting to her appt.  No further needs reported at this time- will continue to follow and assist as needed  Jorge Ny, [place] Worker [facility] Desk#: [phone] Cell#: [phone]

## 2020-11-05 ENCOUNTER — Other Ambulatory Visit: Payer: Self-pay

## 2020-11-05 ENCOUNTER — Telehealth (HOSPITAL_COMMUNITY): Payer: Self-pay | Admitting: Licensed Clinical Social Worker

## 2020-11-05 ENCOUNTER — Encounter (HOSPITAL_COMMUNITY): Payer: Self-pay

## 2020-11-05 ENCOUNTER — Other Ambulatory Visit (HOSPITAL_COMMUNITY): Payer: Self-pay

## 2020-11-05 ENCOUNTER — Ambulatory Visit (HOSPITAL_COMMUNITY)
Admit: 2020-11-05 | Discharge: 2020-11-05 | Disposition: A | Discharge status: Home / Self Care | Payer: Medicare PPO | Attending: Cardiology | Admitting: Cardiology

## 2020-11-05 VITALS — BP 88/64 | HR 57 | Wt 106.4 lb

## 2020-11-05 DIAGNOSIS — Z79899 Other long term (current) drug therapy: Secondary | ICD-10-CM | POA: Insufficient documentation

## 2020-11-05 DIAGNOSIS — N183 Chronic kidney disease, stage 3 unspecified: Secondary | ICD-10-CM | POA: Diagnosis not present

## 2020-11-05 DIAGNOSIS — Z8249 Family history of ischemic heart disease and other diseases of the circulatory system: Secondary | ICD-10-CM | POA: Diagnosis not present

## 2020-11-05 DIAGNOSIS — I4892 Unspecified atrial flutter: Secondary | ICD-10-CM | POA: Diagnosis not present

## 2020-11-05 DIAGNOSIS — M069 Rheumatoid arthritis, unspecified: Secondary | ICD-10-CM | POA: Diagnosis not present

## 2020-11-05 DIAGNOSIS — I5022 Chronic systolic (congestive) heart failure: Secondary | ICD-10-CM | POA: Insufficient documentation

## 2020-11-05 DIAGNOSIS — Z7901 Long term (current) use of anticoagulants: Secondary | ICD-10-CM | POA: Insufficient documentation

## 2020-11-05 DIAGNOSIS — I428 Other cardiomyopathies: Secondary | ICD-10-CM | POA: Diagnosis not present

## 2020-11-05 DIAGNOSIS — Z7952 Long term (current) use of systemic steroids: Secondary | ICD-10-CM | POA: Insufficient documentation

## 2020-11-05 DIAGNOSIS — I5020 Unspecified systolic (congestive) heart failure: Secondary | ICD-10-CM | POA: Diagnosis not present

## 2020-11-05 LAB — BASIC METABOLIC PANEL
Anion gap: 10 (ref 5–15)
BUN: 53 mg/dL — ABNORMAL HIGH (ref 8–23)
CO2: 24 mmol/L (ref 22–32)
Calcium: 9.2 mg/dL (ref 8.9–10.3)
Chloride: 101 mmol/L (ref 98–111)
Creatinine, Ser: 2.36 mg/dL — ABNORMAL HIGH (ref 0.44–1.00)
GFR, Estimated: 21 mL/min — ABNORMAL LOW (ref >60)
Glucose, Bld: 112 mg/dL — ABNORMAL HIGH (ref 70–99)
Potassium: 3.6 mmol/L (ref 3.5–5.1)
Sodium: 135 mmol/L (ref 135–145)

## 2020-11-05 LAB — CBC
HCT: 36.9 % (ref 36.0–46.0)
Hemoglobin: 11.6 g/dL — ABNORMAL LOW (ref 12.0–15.0)
MCH: 28.9 pg (ref 26.0–34.0)
MCHC: 31.4 g/dL (ref 30.0–36.0)
MCV: 91.8 fL (ref 80.0–100.0)
Platelets: 291 10*3/uL (ref 150–400)
RBC: 4.02 MIL/uL (ref 3.87–5.11)
RDW: 18.7 % — ABNORMAL HIGH (ref 11.5–15.5)
WBC: 7.8 10*3/uL (ref 4.0–10.5)
nRBC: 0 % (ref 0.0–0.2)

## 2020-11-05 LAB — TSH: TSH: 2.426 u[IU]/mL (ref 0.350–4.500)

## 2020-11-05 LAB — MAGNESIUM: Magnesium: 2.5 mg/dL — ABNORMAL HIGH (ref 1.7–2.4)

## 2020-11-05 NOTE — Progress Notes (Signed)
ReDS Vest / Clip - 11/05/20 1600      ReDS Vest / Clip   Station Marker A    Ruler Value 28    ReDS Actual Value 25

## 2020-11-05 NOTE — Progress Notes (Signed)
Advanced Heart Failure Clinic Progress Note   Cardiology MD: Dr. Aundra Dubin  75 y.o. with history of rheumatoid arthritis, CKD stage 3, and nonischemic cardiomyopathy was referred by Dr. Andrey Cota in [place] to establish heart failure care in [place].  Patient has been known to have a cardiomyopathy since 2015.  Cardiac MRI in 2015 showed EF 37% with no LGE.  Coronary angiography at that time showed no significant coronary disease.  Over the next few years, LV EF fluctuated up and down.  In 3/21, echo showed EF down to 20-25%.  She was admitted to the hospital in [place], [place] in 11/21 with CHF exacerbation.  Echo showed EF 20-25%, global hypokinesis, mildly decreased RV systolic function.  Creatinine was noted to be elevated. She was diuresed and sent home, but was readmitted later in 11/21 with ongoing volume overload. Creatinine was up to 2.89.  She was taken off most of her cardiac meds due to soft BP and elevated creatinine. She was discharged to her daughter's house in [place].   Echo done here in 12/21, EF < 20% with moderate LV dilation and mildly decreased RV systolic function. RHC was done, surprisingly showing low filling pressures and relatively preserved cardiac index at 2.22.   CPX 10/08/20 showed showed severe functional impairment due to HF and deconditioning. Unfortunately, she was deemed not a candidate for advanced HF therapies due to age and CKD. Also limited GDMT options given low BP and CKD.   10/18/20 she was admitted for a/c CHF and found to be in new atrial flutter w/ RVR and chemically converted w/ amiodarone. Placed on Eliquis for a/c. Diuresed w/ IV Lasix. Transitioned back to Bumex. She was in NSR day of d/c 3/15 and sent home on 200 mg of amiodarone daily. She was referred to paramedicine to help w/ compliance.   Last week, paramedicine noted elevated HR at home visit. EKG was obtained and sent to [facility] for review. She was back in Aflutter and symptomatic w/  palpitations. She was given instruction to increase amiodarone to 200 mg bid and f/u was arranged.   She presents today w/ paramedic, Joellen Jersey, and her daughter. Remains in Atrial flutter, HR 102 bpm. Symptomatic w/ palpitations and occasional bouts of dyspnea. BP is soft 98/64. Not fluid overloaded on exam. ReDs Clip 25%. She reports full compliance w/ Eliquis.    Labs (12/21): K 5 => 3.7, creatinine 2.89 => 1.9 => 1.69, BNP 2904 Labs (1/22): K 4, creatinine 1.9, hgb 12.5 Labs (3/22): K 3.7   Creatinine 1.80  PMH: 1. GERD 2. Rheumatoid arthritis: Followed by rheumatology in [place].  - No known pulmonary disease from RA.  3. Osteoporosis. 4. Gout 5. CKD stage 3 6. H/o SVT 7. Chronic systolic CHF: She was found to have nonischemic cardiomyopathy with low EF in 2015.  - LHC (2015): Normal coronaries.  - cardiac MRI (2015): EF 37%, no LGE - Echo (3/21): EF 20-25%. - Echo (11/21): EF 20-25%, moderate LV dilation, global hypokinesis, biatrial enlargement, mildly decreased RV systolic function.  - Echo (12/21): EF < 20%, moderate LV dilation, normal RV size/systolic function, moderate MR, IVC normal.  - RHC (1/22): Mean RA 1, PA 39/14, mean PCWP 10, PVR 3.9 WU, CI 2.22.   SH: From [place] but recently moved to [place] to live with daughter.  Has 3 children.  Nonsmoker.  No ETOH.   FH: Mother with CHF, father with MI.   ROS: All systems reviewed and negative except as per  HPI.   Current Outpatient Medications  Medication Sig Dispense Refill  . acetaminophen (TYLENOL) 500 MG tablet Take 500-1,000 mg by mouth every 6 (six) hours as needed for moderate pain.    Marland Kitchen amiodarone (PACERONE) 200 MG tablet Take 1 tablet (200 mg total) by mouth 2 (two) times daily. 60 tablet 6  . apixaban (ELIQUIS) 2.5 MG TABS tablet Take 1 tablet (2.5 mg total) by mouth 2 (two) times daily. 60 tablet 6  . azaTHIOprine (IMURAN) 50 MG tablet Take 1 tablet (50 mg total) by mouth daily. 30 tablet 3  . bumetanide  (BUMEX) 1 MG tablet Take 2 mg in AM and 1 mg in PM 180 tablet 3  . cholestyramine (QUESTRAN) 4 g packet Take 1 packet (4 g total) by mouth daily. 60 each 12  . cycloSPORINE (RESTASIS) 0.05 % ophthalmic emulsion Place 1 drop into both eyes 2 (two) times daily. 1.5 mL 3  . Ensure (ENSURE) Take 237 mLs by mouth daily.    . ergocalciferol (VITAMIN D2) 1.25 MG (50000 UT) capsule Take 1 capsule (50,000 Units total) by mouth once a week. 10 capsule 3  . folic acid (FOLVITE) 1 MG tablet Take 1 tablet (1 mg total) by mouth daily. 30 tablet 3  . hydroxychloroquine (PLAQUENIL) 200 MG tablet Take 1 tablet (200 mg total) by mouth daily. 30 tablet 3  . Menthol, Topical Analgesic, (ICY HOT BACK EX) Apply 1 application topically daily as needed (pain).    Marland Kitchen MITIGARE 0.6 MG CAPS Take 0.6 mg by mouth daily as needed (gout).    . Multiple Vitamin (MULTIVITAMIN) tablet Take 1 tablet by mouth daily.    Marland Kitchen omeprazole (PRILOSEC) 40 MG capsule Take 40 mg by mouth daily.    . polyethylene glycol (MIRALAX / GLYCOLAX) 17 g packet Take 17 g by mouth as needed.    . predniSONE (DELTASONE) 5 MG tablet Take 1 tablet (5 mg total) by mouth daily with breakfast. 30 tablet 3  . Vericiguat (VERQUVO) 10 MG TABS Take 10 mg by mouth daily. 30 tablet 11  . ibandronate (BONIVA) 150 MG tablet Take 150 mg by mouth every 30 (thirty) days. Take in the morning with a full glass of water, on an empty stomach, and do not take anything else by mouth or lie down for the next 30 min. (Patient not taking: No sig reported)     No current facility-administered medications for this encounter.   BP (!) 88/64   Pulse (!) 57   Wt 48.3 kg   SpO2 99%   BMI 18.26 kg/m  PHYSICAL EXAM: ReDs Clip 25% General:  Thin/frail appearing. No respiratory difficulty HEENT: normal Neck: supple. no JVD. Carotids 2+ bilat; no bruits. No lymphadenopathy or thyromegaly appreciated. Cor: PMI nondisplaced. Irregular rhythm and rate. No rubs, gallops or  murmurs. Lungs: clear Abdomen: soft, nontender, nondistended. No hepatosplenomegaly. No bruits or masses. Good bowel sounds. Extremities: no cyanosis, clubbing, rash, edema Neuro: alert & oriented x 3, cranial nerves grossly intact. moves all 4 extremities w/o difficulty. Affect pleasant.  Assessment/Plan: 1. Chronic systolic CHF: Nonischemic cardiomyopathy.  This has been known since 2015, cath in 2015 showed no coronary disease and cardiac MRI in 2015 showed no LGE.  Cause is uncertain, familial cardiomyopathy is a concern given nonischemic cardiomyopathy in her mother.  Cannot rule out remote viral myocarditis. She seems quite limited, primarily by fatigue, NYHA class IIIb.  Poor appetite and weight loss is concerning, as is elevated creatinine (suspect cardiorenal syndrome).  Low BP and elevated creatinine have limited her cardiac med regimen. Most recent echo in 12/21 showed EF < 20% with normal RV systolic function. RHC (1/22) showed low filling pressures and actually a relatively preserved cardiac index of 2.22.  CPX 10/08/20 showed showed severe functional impairment due to HF and deconditioning. Unfortunately, she was deemed not a candidate for advanced HF therapies due to age and CKD. HF now c/b new atrial flutter (see plan below).  - ? TTR Amyloidosis. Had prior cMRI in 2015 that was unremarkable. Consider PYP scan (will d/w Dr. Aundra Dubin)  - She is not volume overloaded on exam today. ReDs Clip 25% - Continue Bumex 2 mg qam, 1 gm qpm  - No longer on Farxiga, Spiro nor metoprolol due to hypotension and worsening renal function.  - Contiknue Verquvo 10 mg daily.  2. Atrial Flutter: newly diagnosed 3/22 during recent hospitalization. Chemically converted w/ amiodarone. On Eliquis for a/c - now back in atrial flutter, V-rates low 100s. Continue w/ amiodarone 200 mg bid (dose recently increased) - continue Eliquis 2.5 mg bid (reduced dose due to wt and renal function) - set up for TEE guided/DCCV  (has not been on eliquis for full 30 days) - refer to EP for Aflutter ablation evaluation  - check BMP, Mg level and TSH  - arrange for sleep study (h/o daytime somnolence)  2. CKD: Stage 3.   - BMET today.  - she has been referred to nephrology. Now followed by Dr. Royce Macadamia. Scheduled for renal US tomorrow  3. Rheumatoid arthritis: No history of lung involvement. She has been on a low dose of prednisone chronically.  - she has been referred to Dr. Amil Amen to establish care for RA.  4. H/o GI discomfort: Band of pain across upper abdomen, worse after meals.  Early satiety.  It is possible that symptoms may be due to heart failure itself, but she would like GI evaluation which I think is reasonable.  - she has been referred to GI - ? Amyloid given other cardiac issues. Will consider PYP scan after we restore NSR.  F/u in 1 week post TEE/DCCV for repeat EKG.   Lyda Jester, PA-C  11/05/2020

## 2020-11-05 NOTE — Patient Instructions (Addendum)
It was great to see you today! No medication changes are needed at this time.  Labs today We will only contact you if something comes back abnormal or we need to make some changes. Otherwise no news is good news!  Your physician has requested that you have a TEE/Cardioversion. During a TEE, sound waves are used to create images of your heart. It provides your doctor with information about the size and shape of your heart and how well your heart's chambers and valves are working. In this test, a transducer is attached to the end of a flexible tube that is guided down you throat and into your esophagus (the tube leading from your mouth to your stomach) to get a more detailed image of your heart. Once the TEE has determined that a blood clot is not present, the cardioversion begins. Electrical Cardioversion uses a jolt of electricity to your heart either through paddles or wired patches attached to your chest. This is a controlled, usually prescheduled, procedure. This procedure is done at the hospital and you are not awake during the procedure. You usually go home the day of the procedure. Please see the instruction sheet given to you today for more information.  You have been referred to [facility] with Dr Rayann Heman (Electrophysiology) -they will be in contact with an appointment  Your physician recommends that you schedule a follow-up appointment in: 1-2 weeks for repeat EKG with nurse  If you have any questions or concerns before your next appointment please send [place] a message through [facility] or call our office at [phone].    TO LEAVE A MESSAGE FOR THE NURSE SELECT OPTION 2, PLEASE LEAVE A MESSAGE INCLUDING: . YOUR NAME . DATE OF BIRTH . CALL BACK NUMBER . REASON FOR CALL**this is important as we prioritize the call backs  YOU WILL RECEIVE A CALL BACK THE SAME DAY AS LONG AS YOU CALL BEFORE 4:00 PM        You are scheduled for a TEE/Cardioversion/TEE Cardioversion on Friday November 08, 2020 with Dr. Aundra Dubin.  Please arrive at the [facility] (Main Entrance A) at [facility]: [address] [place], [place] [address] at 6:30 am/pm. (1 hour prior to procedure unless lab work is needed; if lab work is needed arrive 1.5 hours ahead)  DIET: Nothing to eat or drink after midnight except a sip of water with medications (see medication instructions below)  Medication Instructions: Hold Bumex  Continue your anticoagulant: Eliquis You will need to continue your anticoagulant after your procedure until you  are told by your  Provider that it is safe to stop   Labs: If patient is on Coumadin, patient needs pt/INR, CBC, BMET within 3 days (No pt/INR needed for patients taking Xarelto, Eliquis, Pradaxa) For patients receiving anesthesia for TEE and all Cardioversion patients: BMET, CBC within 1 week  Come to: Pre procedure labs done 11/05/2020   You will need a pre procedure COVID test    WHEN: 11/06/20  anytime between 9am-3pm WHERE: COVID Test Site [address] [place], [place] [address]  This is a drive thru testing site, you will remain in your car. Be sure to get in the line FOR PROCEDURES Once you have been swabbed you will need to remain home in quarantine until you return for your procedure.    You must have a responsible person to drive you home and stay in the waiting area during your procedure. Failure to do so could result in cancellation.  Bring your insurance cards.  *Special Note: Every effort is made to have your procedure done on time. Occasionally there are emergencies that occur at the hospital that may cause delays. Please be patient if a delay does occur.

## 2020-11-05 NOTE — Telephone Encounter (Signed)
CSW called [employer] and was able to confirm that ride for tomorrows [place] appt has been scheduled- anticipate pick up for pt will be around 3:15pm- they will call pt to inform of ride details this afternoon.  Will continue to follow and assist as needed  Jorge Ny, [facility] Desk#: [phone] Cell#: [phone]

## 2020-11-05 NOTE — H&P (View-Only) (Signed)
ReDS Vest / Clip - 11/05/20 1600      ReDS Vest / Clip   Station Marker A    Ruler Value 28    ReDS Actual Value 25

## 2020-11-05 NOTE — Progress Notes (Signed)
Paramedicine Encounter   [name] , female,   DOB: 1946-05-15,75 y.o.,  MRN: [id number]   Met patient in clinic today with provider.  Weight @ clinic-104 B/p-98/72 p-52 sp02-99  Pt still weak and sob, she is taking extra bumex when needed for increased sob and has had to do that twice this week already.  She had a fall last night and got up to use bathroom too quickly. She denies hitting head when that happened.  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   She also has pain to her rt lower flank. That began before the fall.  Her [place] tomor is of the kidneys.  She is being recommended for a cardioversion for the aflutter.  She will need a TEE prior to this cardioversion since she hasnt been on eliquis for full 30 days yet.   REDS CLIP-25%  To do list for in future will need to revisit a sleep study test.   Marylouise Stacks, [place] 11/05/2020

## 2020-11-06 ENCOUNTER — Telehealth (HOSPITAL_COMMUNITY): Payer: Self-pay | Admitting: Pharmacy Technician

## 2020-11-06 ENCOUNTER — Other Ambulatory Visit: Payer: Medicare PPO

## 2020-11-06 ENCOUNTER — Ambulatory Visit
Admission: RE | Admit: 2020-11-06 | Discharge: 2020-11-06 | Disposition: A | Discharge status: Home / Self Care | Payer: Medicare PPO | Source: Ambulatory Visit | Attending: Nephrology | Admitting: Nephrology

## 2020-11-06 DIAGNOSIS — N184 Chronic kidney disease, stage 4 (severe): Secondary | ICD-10-CM

## 2020-11-06 NOTE — Telephone Encounter (Signed)
Resent in BMS application via fax.

## 2020-11-07 ENCOUNTER — Telehealth (HOSPITAL_COMMUNITY): Payer: Self-pay | Admitting: Licensed Clinical Social Worker

## 2020-11-07 ENCOUNTER — Other Ambulatory Visit (HOSPITAL_COMMUNITY): Payer: Self-pay | Admitting: Cardiology

## 2020-11-07 ENCOUNTER — Other Ambulatory Visit (HOSPITAL_COMMUNITY)
Admission: RE | Admit: 2020-11-07 | Discharge: 2020-11-07 | Disposition: A | Discharge status: Home / Self Care | Payer: Medicare PPO | Source: Ambulatory Visit | Attending: Cardiology | Admitting: Cardiology

## 2020-11-07 DIAGNOSIS — Z20822 Contact with and (suspected) exposure to covid-19: Secondary | ICD-10-CM | POA: Diagnosis not present

## 2020-11-07 DIAGNOSIS — Z01812 Encounter for preprocedural laboratory examination: Secondary | ICD-10-CM | POA: Insufficient documentation

## 2020-11-07 LAB — SARS CORONAVIRUS 2 (TAT 6-24 HRS): SARS Coronavirus 2: NEGATIVE

## 2020-11-07 NOTE — Telephone Encounter (Signed)
Pt called CSW to inquire if she needed to get COVID test before planned procedure tomorrow.  CSW discussed with medical staff who confirmed pt would need test today in order to proceed with procedure tomorrow and could go anytime between 9-3pm to [facility] testing center.  Pt needing help with transportation to get to test as her family is at work and she doesn't have a way to get there.  CSW able to arrange through [place] who will pick her up between 12:30pm-1pm.  Will continue to follow and assist as needed  Jorge Ny, [facility] Desk#: [phone] Cell#: [phone]

## 2020-11-08 ENCOUNTER — Encounter (HOSPITAL_COMMUNITY): Payer: Self-pay | Admitting: Cardiology

## 2020-11-08 ENCOUNTER — Ambulatory Visit (HOSPITAL_COMMUNITY)
Admission: RE | Admit: 2020-11-08 | Discharge: 2020-11-08 | Disposition: A | Discharge status: Home / Self Care | Payer: Medicare PPO | Attending: Cardiology | Admitting: Cardiology

## 2020-11-08 ENCOUNTER — Ambulatory Visit (HOSPITAL_BASED_OUTPATIENT_CLINIC_OR_DEPARTMENT_OTHER)
Admission: RE | Admit: 2020-11-08 | Discharge: 2020-11-08 | Disposition: A | Discharge status: Home / Self Care | Payer: Medicare PPO | Source: Home / Self Care | Attending: Cardiology | Admitting: Cardiology

## 2020-11-08 ENCOUNTER — Other Ambulatory Visit: Payer: Self-pay

## 2020-11-08 ENCOUNTER — Ambulatory Visit (HOSPITAL_COMMUNITY): Payer: Medicare PPO | Admitting: Certified Registered Nurse Anesthetist

## 2020-11-08 ENCOUNTER — Encounter (HOSPITAL_COMMUNITY)
Admission: RE | Disposition: A | Discharge status: Home / Self Care | Payer: Self-pay | Source: Home / Self Care | Attending: Cardiology

## 2020-11-08 DIAGNOSIS — I5022 Chronic systolic (congestive) heart failure: Secondary | ICD-10-CM | POA: Insufficient documentation

## 2020-11-08 DIAGNOSIS — I130 Hypertensive heart and chronic kidney disease with heart failure and stage 1 through stage 4 chronic kidney disease, or unspecified chronic kidney disease: Secondary | ICD-10-CM | POA: Insufficient documentation

## 2020-11-08 DIAGNOSIS — I4892 Unspecified atrial flutter: Secondary | ICD-10-CM | POA: Insufficient documentation

## 2020-11-08 DIAGNOSIS — I4891 Unspecified atrial fibrillation: Secondary | ICD-10-CM | POA: Insufficient documentation

## 2020-11-08 DIAGNOSIS — R9431 Abnormal electrocardiogram [ECG] [EKG]: Secondary | ICD-10-CM

## 2020-11-08 DIAGNOSIS — I340 Nonrheumatic mitral (valve) insufficiency: Secondary | ICD-10-CM | POA: Diagnosis not present

## 2020-11-08 DIAGNOSIS — I080 Rheumatic disorders of both mitral and aortic valves: Secondary | ICD-10-CM | POA: Insufficient documentation

## 2020-11-08 DIAGNOSIS — I428 Other cardiomyopathies: Secondary | ICD-10-CM | POA: Diagnosis not present

## 2020-11-08 DIAGNOSIS — N183 Chronic kidney disease, stage 3 unspecified: Secondary | ICD-10-CM | POA: Insufficient documentation

## 2020-11-08 DIAGNOSIS — Z79899 Other long term (current) drug therapy: Secondary | ICD-10-CM | POA: Insufficient documentation

## 2020-11-08 DIAGNOSIS — Z7901 Long term (current) use of anticoagulants: Secondary | ICD-10-CM | POA: Insufficient documentation

## 2020-11-08 DIAGNOSIS — M069 Rheumatoid arthritis, unspecified: Secondary | ICD-10-CM | POA: Diagnosis not present

## 2020-11-08 HISTORY — PX: CARDIOVERSION: SHX1299

## 2020-11-08 HISTORY — PX: TEE WITHOUT CARDIOVERSION: SHX5443

## 2020-11-08 SURGERY — ECHOCARDIOGRAM, TRANSESOPHAGEAL
Anesthesia: Monitor Anesthesia Care

## 2020-11-08 MED ORDER — LIDOCAINE 2% (20 MG/ML) 5 ML SYRINGE
INTRAMUSCULAR | Status: DC | PRN
  Administered 2020-11-08: 40 mg via INTRAVENOUS

## 2020-11-08 MED ORDER — SODIUM CHLORIDE 0.9 % IV SOLN
INTRAVENOUS | Status: DC
  Administered 2020-11-08: 500 mL via INTRAVENOUS

## 2020-11-08 MED ORDER — PROPOFOL 500 MG/50ML IV EMUL
INTRAVENOUS | Status: DC | PRN
  Administered 2020-11-08: 50 ug/kg/min via INTRAVENOUS

## 2020-11-08 MED ORDER — PROPOFOL 10 MG/ML IV BOLUS
INTRAVENOUS | Status: DC | PRN
  Administered 2020-11-08 (×2): 10 mg via INTRAVENOUS
  Administered 2020-11-08: 15 mg via INTRAVENOUS
  Administered 2020-11-08 (×2): 10 mg via INTRAVENOUS

## 2020-11-08 MED ORDER — PHENYLEPHRINE 40 MCG/ML (10ML) SYRINGE FOR IV PUSH (FOR BLOOD PRESSURE SUPPORT)
PREFILLED_SYRINGE | INTRAVENOUS | Status: DC | PRN
  Administered 2020-11-08 (×2): 80 ug via INTRAVENOUS

## 2020-11-08 NOTE — Discharge Instructions (Signed)
TEE  YOU HAD AN CARDIAC PROCEDURE TODAY: Refer to the procedure report and other information in the discharge instructions given to you for any specific questions about what was found during the examination. If this information does not answer your questions, please call [facility] office at [phone] to clarify.   DIET: Your first meal following the procedure should be a light meal and then it is ok to progress to your normal diet. A half-sandwich or bowl of soup is an example of a good first meal. Heavy or fried foods are harder to digest and may make you feel nauseous or bloated. Drink plenty of fluids but you should avoid alcoholic beverages for 24 hours. If you had a esophageal dilation, please see attached instructions for diet.   ACTIVITY: Your care partner should take you home directly after the procedure. You should plan to take it easy, moving slowly for the rest of the day. You can resume normal activity the day after the procedure however YOU SHOULD NOT DRIVE, use power tools, machinery or perform tasks that involve climbing or major physical exertion for 24 hours (because of the sedation medicines used during the test).   SYMPTOMS TO REPORT IMMEDIATELY: A cardiologist can be reached at any hour. Please call [phone] for any of the following symptoms:  Vomiting of blood or coffee ground material  New, significant abdominal pain  New, significant chest pain or pain under the shoulder blades  Painful or persistently difficult swallowing  New shortness of breath  Black, tarry-looking or red, bloody stools  FOLLOW UP:  Please also call with any specific questions about appointments or follow up tests.  Electrical Cardioversion Electrical cardioversion is the delivery of a jolt of electricity to restore a normal rhythm to the heart. A rhythm that is too fast or is not regular keeps the heart from pumping well. In this procedure, sticky patches or metal paddles are placed on  the chest to deliver electricity to the heart from a device. This procedure may be done in an emergency if:  There is low or no blood pressure as a result of the heart rhythm.  Normal rhythm must be restored as fast as possible to protect the brain and heart from further damage.  It may save a life. This may also be a scheduled procedure for irregular or fast heart rhythms that are not immediately life-threatening.  What can I expect after the procedure?  Your blood pressure, heart rate, breathing rate, and blood oxygen level will be monitored until you leave the hospital or clinic.  Your heart rhythm will be watched to make sure it does not change.  You may have some redness on the skin where the shocks were given. Over the counter cortizone cream may be helpful.  Follow these instructions at home:  Do not drive for 24 hours if you were given a sedative during your procedure.  Take over-the-counter and prescription medicines only as told by your health care provider.  Ask your health care provider how to check your pulse. Check it often.  Rest for 48 hours after the procedure or as told by your health care provider.  Avoid or limit your caffeine use as told by your health care provider.  Keep all follow-up visits as told by your health care provider. This is important. Contact a health care provider if:  You feel like your heart is beating too quickly or your pulse is not regular.  You have a serious muscle cramp  that does not go away. Get help right away if:  You have discomfort in your chest.  You are dizzy or you feel faint.  You have trouble breathing or you are short of breath.  Your speech is slurred.  You have trouble moving an arm or leg on one side of your body.  Your fingers or toes turn cold or blue. Summary  Electrical cardioversion is the delivery of a jolt of electricity to restore a normal rhythm to the heart.  This procedure may be done right away  in an emergency or may be a scheduled procedure if the condition is not an emergency.  Generally, this is a safe procedure.  After the procedure, check your pulse often as told by your health care provider. This information is not intended to replace advice given to you by your health care provider. Make sure you discuss any questions you have with your health care provider. Document Revised: 02/27/2019 Document Reviewed: 02/27/2019 Elsevier Patient Education  [place].

## 2020-11-08 NOTE — Progress Notes (Signed)
  Echocardiogram Echocardiogram Transesophageal has been performed.  Bobbye Charleston 11/08/2020, 8:25 AM

## 2020-11-08 NOTE — Procedures (Signed)
Electrical Cardioversion Procedure Note Tracy Nelson [id number] November 11, 1945  Procedure: Electrical Cardioversion Indications:  Atrial Fibrillation  Procedure Details Consent: Risks of procedure as well as the alternatives and risks of each were explained to the (patient/caregiver).  Consent for procedure obtained. Time Out: Verified patient identification, verified procedure, site/side was marked, verified correct patient position, special equipment/implants available, medications/allergies/relevent history reviewed, required imaging and test results available.  Performed  Patient placed on cardiac monitor, pulse oximetry, supplemental oxygen as necessary.  Sedation given: Per anesthesiology Pacer pads placed anterior and posterior chest.  Cardioverted 1 time(s).  Cardioverted at [place].  Evaluation Findings: Post procedure EKG shows: NSR Complications: None Patient did tolerate procedure well.   Loralie Champagne 11/08/2020, 8:21 AM

## 2020-11-08 NOTE — CV Procedure (Signed)
Procedure: TEE  Indication: Atrial fibrillation  Sedation: Propofol per anesthesiology  Findings: Please see echo section for full report.  The left ventricle was moderately dilated with EF < 20%, diffuse hypokinesis.  No LV thrombus noted.  The right ventricle was mildly dilated with moderately decreased systolic function.  Mild right atrial enlargement.  Mild left atrial enlargement with no LA appendage thrombus.  No PFO/ASD by color doppler.  Moderate mitral regurgitation (central, likely functional).  Trileaflet, calcified aortic valve with no stenosis and mild regurgitation.  Normal caliber thoracic aorta, minimal plaque.   Impression: May proceed to DCCV.   Loralie Champagne 11/08/2020 8:21 AM

## 2020-11-08 NOTE — Anesthesia Postprocedure Evaluation (Signed)
Anesthesia Post Note  Patient: Tracy Nelson  Procedure(s) Performed: TRANSESOPHAGEAL ECHOCARDIOGRAM (TEE) (N/A ) CARDIOVERSION (N/A )     Patient location during evaluation: Endoscopy Anesthesia Type: MAC Level of consciousness: awake and alert Pain management: pain level controlled Vital Signs Assessment: post-procedure vital signs reviewed and stable Respiratory status: spontaneous breathing, nonlabored ventilation, respiratory function stable and patient connected to nasal cannula oxygen Cardiovascular status: stable and blood pressure returned to baseline Postop Assessment: no apparent nausea or vomiting Anesthetic complications: no   No complications documented.  Last Vitals:  Vitals:   11/08/20 0903 11/08/20 0914  BP: (!) 88/66 94/71  Pulse: (!) 55   Resp: 20 16  Temp:    SpO2: 93%     Last Pain:  Vitals:   11/08/20 0903  TempSrc:   PainSc: 0-No pain                 Khyla Mccumbers COKER

## 2020-11-08 NOTE — Transfer of Care (Signed)
Immediate Anesthesia Transfer of Care Note  Patient: Tracy Nelson  Procedure(s) Performed: TRANSESOPHAGEAL ECHOCARDIOGRAM (TEE) (N/A ) CARDIOVERSION (N/A )  Patient Location: Endoscopy Unit  Anesthesia Type:MAC  Level of Consciousness: drowsy  Airway & Oxygen Therapy: Patient Spontanous Breathing and Patient connected to nasal cannula oxygen  Post-op Assessment: Report given to RN and Post -op Vital signs reviewed and stable  Post vital signs: Reviewed and stable  Last Vitals:  Vitals Value Taken Time  BP 84/68 11/08/20 0829  Temp 36.7 C 11/08/20 0824  Pulse 77 11/08/20 0831  Resp 31 11/08/20 0831  SpO2 100 % 11/08/20 0831  Vitals shown include unvalidated device data.  Last Pain:  Vitals:   11/08/20 0824  TempSrc: Oral  PainSc: 0-No pain         Complications: No complications documented.

## 2020-11-08 NOTE — Anesthesia Procedure Notes (Signed)
Procedure Name: MAC Date/Time: 11/08/2020 7:55 AM Performed by: Colin Benton, CRNA Pre-anesthesia Checklist: Patient identified, Emergency Drugs available, Patient being monitored and Suction available Patient Re-evaluated:Patient Re-evaluated prior to induction Oxygen Delivery Method: Nasal cannula Induction Type: IV induction Airway Equipment and Method: Bite block Placement Confirmation: positive ETCO2 Dental Injury: Teeth and Oropharynx as per pre-operative assessment

## 2020-11-08 NOTE — Anesthesia Preprocedure Evaluation (Signed)
Anesthesia Evaluation  Patient identified by MRN, date of birth, ID band Patient awake    Reviewed: Allergy & Precautions, NPO status , Patient's Chart, lab work & pertinent test results  Airway Mallampati: II  TM Distance: >3 FB Neck ROM: Full    Dental  (+) Edentulous Upper, Edentulous Lower   Pulmonary     + decreased breath sounds      Cardiovascular hypertension,  Rhythm:Irregular Rate:Normal     Neuro/Psych    GI/Hepatic   Endo/Other    Renal/GU      Musculoskeletal   Abdominal   Peds  Hematology   Anesthesia Other Findings   Reproductive/Obstetrics                             Anesthesia Physical Anesthesia Plan  ASA: III  Anesthesia Plan: MAC   Post-op Pain Management:    Induction: Intravenous  PONV Risk Score and Plan: Propofol infusion  Airway Management Planned: Natural Airway and Nasal Cannula  Additional Equipment:   Intra-op Plan:   Post-operative Plan:   Informed Consent: I have reviewed the patients History and Physical, chart, labs and discussed the procedure including the risks, benefits and alternatives for the proposed anesthesia with the patient or authorized representative who has indicated his/her understanding and acceptance.       Plan Discussed with: CRNA and Anesthesiologist  Anesthesia Plan Comments:         Anesthesia Quick Evaluation

## 2020-11-08 NOTE — Interval H&P Note (Signed)
History and Physical Interval Note:  11/08/2020 8:02 AM  Tracy Nelson  has presented today for surgery, with the diagnosis of Aflutter.  The various methods of treatment have been discussed with the patient and family. After consideration of risks, benefits and other options for treatment, the patient has consented to  Procedure(s): TRANSESOPHAGEAL ECHOCARDIOGRAM (TEE) (N/A) CARDIOVERSION (N/A) as a surgical intervention.  The patient's history has been reviewed, patient examined, no change in status, stable for surgery.  I have reviewed the patient's chart and labs.  Questions were answered to the patient's satisfaction.     Nani Ingram [employer]

## 2020-11-10 ENCOUNTER — Encounter (HOSPITAL_COMMUNITY): Payer: Self-pay | Admitting: Cardiology

## 2020-11-12 ENCOUNTER — Encounter (HOSPITAL_COMMUNITY): Payer: Self-pay | Admitting: Cardiology

## 2020-11-12 ENCOUNTER — Telehealth (HOSPITAL_COMMUNITY): Payer: Self-pay | Admitting: Licensed Clinical Social Worker

## 2020-11-12 ENCOUNTER — Other Ambulatory Visit (HOSPITAL_COMMUNITY): Payer: Self-pay

## 2020-11-12 ENCOUNTER — Other Ambulatory Visit: Payer: Self-pay

## 2020-11-12 ENCOUNTER — Ambulatory Visit (HOSPITAL_COMMUNITY)
Admission: RE | Admit: 2020-11-12 | Discharge: 2020-11-12 | Disposition: A | Discharge status: Home / Self Care | Payer: Medicare PPO | Source: Ambulatory Visit | Attending: Cardiology | Admitting: Cardiology

## 2020-11-12 VITALS — BP 90/60 | HR 79 | Wt 107.6 lb

## 2020-11-12 DIAGNOSIS — I509 Heart failure, unspecified: Secondary | ICD-10-CM | POA: Diagnosis not present

## 2020-11-12 DIAGNOSIS — M069 Rheumatoid arthritis, unspecified: Secondary | ICD-10-CM | POA: Diagnosis not present

## 2020-11-12 DIAGNOSIS — Z8249 Family history of ischemic heart disease and other diseases of the circulatory system: Secondary | ICD-10-CM | POA: Diagnosis not present

## 2020-11-12 DIAGNOSIS — I4891 Unspecified atrial fibrillation: Secondary | ICD-10-CM | POA: Insufficient documentation

## 2020-11-12 DIAGNOSIS — Z7901 Long term (current) use of anticoagulants: Secondary | ICD-10-CM | POA: Diagnosis not present

## 2020-11-12 DIAGNOSIS — I428 Other cardiomyopathies: Secondary | ICD-10-CM | POA: Insufficient documentation

## 2020-11-12 DIAGNOSIS — I5022 Chronic systolic (congestive) heart failure: Secondary | ICD-10-CM | POA: Diagnosis not present

## 2020-11-12 DIAGNOSIS — M109 Gout, unspecified: Secondary | ICD-10-CM | POA: Insufficient documentation

## 2020-11-12 DIAGNOSIS — Z79899 Other long term (current) drug therapy: Secondary | ICD-10-CM | POA: Insufficient documentation

## 2020-11-12 DIAGNOSIS — I4892 Unspecified atrial flutter: Secondary | ICD-10-CM | POA: Insufficient documentation

## 2020-11-12 DIAGNOSIS — E859 Amyloidosis, unspecified: Secondary | ICD-10-CM

## 2020-11-12 DIAGNOSIS — N184 Chronic kidney disease, stage 4 (severe): Secondary | ICD-10-CM | POA: Insufficient documentation

## 2020-11-12 DIAGNOSIS — R1310 Dysphagia, unspecified: Secondary | ICD-10-CM | POA: Diagnosis not present

## 2020-11-12 DIAGNOSIS — I491 Atrial premature depolarization: Secondary | ICD-10-CM | POA: Diagnosis not present

## 2020-11-12 DIAGNOSIS — Z7952 Long term (current) use of systemic steroids: Secondary | ICD-10-CM | POA: Diagnosis not present

## 2020-11-12 DIAGNOSIS — I5020 Unspecified systolic (congestive) heart failure: Secondary | ICD-10-CM | POA: Diagnosis not present

## 2020-11-12 LAB — COMPREHENSIVE METABOLIC PANEL
ALT: 23 U/L (ref 0–44)
AST: 30 U/L (ref 15–41)
Albumin: 3.7 g/dL (ref 3.5–5.0)
Alkaline Phosphatase: 64 U/L (ref 38–126)
Anion gap: 11 (ref 5–15)
BUN: 32 mg/dL — ABNORMAL HIGH (ref 8–23)
CO2: 24 mmol/L (ref 22–32)
Calcium: 9.6 mg/dL (ref 8.9–10.3)
Chloride: 101 mmol/L (ref 98–111)
Creatinine, Ser: 2.01 mg/dL — ABNORMAL HIGH (ref 0.44–1.00)
GFR, Estimated: 25 mL/min — ABNORMAL LOW (ref >60)
Glucose, Bld: 81 mg/dL (ref 70–99)
Potassium: 4.2 mmol/L (ref 3.5–5.1)
Sodium: 136 mmol/L (ref 135–145)
Total Bilirubin: 0.8 mg/dL (ref 0.3–1.2)
Total Protein: 7.1 g/dL (ref 6.5–8.1)

## 2020-11-12 MED ORDER — AMIODARONE HCL 200 MG PO TABS: 200.0000 mg | ORAL_TABLET | Freq: Every day | ORAL | 6 refills | Status: DC

## 2020-11-12 NOTE — Progress Notes (Signed)
Paramedicine Encounter   [name] , female,   DOB: 17-Jun-1946,75 y.o.,  MRN: [id number]   Met patient in clinic today with provider.  Weight @ clinic-107 B/p-90/60 p-79 sp02-98  Pt had cardioversion last Friday, she is back today for f/u. The pharmacy did not fill her amio for BID so she has been taking it once a day vs BID for now. She will run out of it today, but the pharmacy did get it fixed. She will pick it up today.  She does report she thinks she feels better.  Her amio can be dropped back down to once a day.  EKG done today--normal rhythm today.  CPX test-severe limitation from CHF   No advanced therapies can be done right now. Will try to manage with meds.  -will have research  trial nurse to come talk to her about possibly a baro stimulator or a nerve stimulator  -will refer to do PYP scan to check for amyloidosis Labs done today as well.   She reports that she has trouble falling asleep and was scared to try melatonin-dr mclean advised her that is was safe to use.  She is to come back in 1 month.  Will f/u next week.   Marylouise Stacks, [place] 11/12/2020

## 2020-11-12 NOTE — Patient Instructions (Signed)
Decrease Amiodarone to 200 mg daily  Lab work done today we will call you for any abnormal results  You have been ordered a PYP Scan.  This is done in the Radiology Department of [facility].  When you come for this test please plan to be there 2-3 hours.   Your physician recommends that you schedule a follow-up appointment in: 1 month  If you have any questions or concerns before your next appointment please send [place] a message through [place] or call our office at [phone].    TO LEAVE A MESSAGE FOR THE NURSE SELECT OPTION 2, PLEASE LEAVE A MESSAGE INCLUDING: . YOUR NAME . DATE OF BIRTH . CALL BACK NUMBER . REASON FOR CALL**this is important as we prioritize the call backs  [place] AS LONG AS YOU CALL BEFORE 4:00 PM  At the [facility], you and your health needs are our priority. As part of our continuing mission to provide you with exceptional heart care, we have created designated Provider Care Teams. These Care Teams include your primary Cardiologist (physician) and Advanced Practice Providers (APPs- Physician Assistants and Nurse Practitioners) who all work together to provide you with the care you need, when you need it.   You may see any of the following providers on your designated Care Team at your next follow up: Marland Kitchen Dr Glori Bickers . Dr Loralie Champagne . Dr Vickki Muff . Darrick Grinder, NP . Lyda Jester, [place] . Audry Riles, PharmD   Please be sure to bring in all your medications bottles to every appointment.

## 2020-11-12 NOTE — Telephone Encounter (Signed)
CSW received call from pt stating that she is out of amiodarone and the pharmacy had told her the MD office needed to call in.  [place] called pharmacy and helped clear up confusion- they confirm they can fill for pt today- pt informed and will plan to pick up this afternoon  Will continue to follow and assist as needed  Jorge Ny, [place] Desk#: [phone] Cell#: [phone]

## 2020-11-12 NOTE — Progress Notes (Addendum)
Cardiology: Dr. Aundra Dubin  75 y.o. with history of rheumatoid arthritis, CKD stage 3, and nonischemic cardiomyopathy was referred by Dr. Andrey Cota in [place] to establish heart failure care in [place].  Patient has been known to have a cardiomyopathy since 2015.  Cardiac MRI in 2015 showed EF 37% with no LGE.  Coronary angiography at that time showed no significant coronary disease.  Over the next few years, LV EF fluctuated up and down.  In 3/21, echo showed EF down to 20-25%.  She was admitted to the hospital in [place], [place] in 11/21 with CHF exacerbation.  Echo showed EF 20-25%, global hypokinesis, mildly decreased RV systolic function.  Creatinine was noted to be elevated. She was diuresed and sent home, but was readmitted later in 11/21 with ongoing volume overload. Creatinine was up to 2.89.  She was taken off most of her cardiac meds due to soft BP and elevated creatinine. She was discharged to her daughter's house in [place].   I repeated an echo on her in 12/21, EF < 20% with moderate LV dilation and mildly decreased RV systolic function. RHC was done, surprisingly showing low filling pressures and relatively preserved cardiac index at 2.22.   She was admitted in 3/22 with atrial fibrillation, converted to NSR on amiodarone. She went into atrial flutter after this and had TEE-DCCV in 4/22.  TEE showed EF < 20%, moderate LV dilation, mild RV dilation with moderate RV dysfunction, moderate central MR.  CPX in 3/22 showed severe HF limitation.   Patient returns today for followup of CHF. She is in NSR today.  SBP 90 today though she denies lightheadedness or falls.  She is not short of breath walking around her house, but gets fatigued walking longer distances.  No chest pain.  No prolonged palpitations since her DCCV. She does note some dysphagia post-TEE, she had dysphagia pre-TEE but feels like it is a bit worse now.  No hematemesis. No orthopnea/PND.  Frail.  Weight is up 5 lbs.   Labs  (12/21): K 5 => 3.7, creatinine 2.89 => 1.9 => 1.69, BNP 2904 Labs (1/22): K 4, creatinine 1.9, hgb 12.5 Labs (3/22): K 3.6, creatinine 2.36, hgb 11.6, TSH normal  PMH: 1. GERD 2. Rheumatoid arthritis: Followed by rheumatology in [place].  - No known pulmonary disease from RA.  3. Osteoporosis. 4. Gout 5. CKD stage 4 6. H/o SVT 7. Chronic systolic CHF: She was found to have nonischemic cardiomyopathy with low EF in 2015.  - LHC (2015): Normal coronaries.  - cardiac MRI (2015): EF 37%, no LGE - Echo (3/21): EF 20-25%. - Echo (11/21): EF 20-25%, moderate LV dilation, global hypokinesis, biatrial enlargement, mildly decreased RV systolic function.  - Echo (12/21): EF < 20%, moderate LV dilation, normal RV size/systolic function, moderate MR, IVC normal.  - RHC (1/22): Mean RA 1, PA 39/14, mean PCWP 10, PVR 3.9 WU, CI 2.22.  - CPX (3/22): peak VO2 10.1, VE/VCO2 55, RER 1.13.  Severe functional limitation due to HF.  - TEE (4/22): EF < 20%, moderate LV dilation, mild RV dilation with moderate RV dysfunction, moderate central MR. 8. Atrial fibrillation/flutter: TEE-DCCV in 4/22.   SH: From [place] but recently moved to [place] to live with daughter.  Has 3 children.  Nonsmoker.  No ETOH.   FH: Mother with CHF, father with MI.   ROS: All systems reviewed and negative except as per HPI.   Current Outpatient Medications  Medication Sig Dispense Refill  . acetaminophen (TYLENOL) 500  MG tablet Take 500-1,000 mg by mouth every 6 (six) hours as needed for moderate pain.    Marland Kitchen apixaban (ELIQUIS) 2.5 MG TABS tablet Take 1 tablet (2.5 mg total) by mouth 2 (two) times daily. 60 tablet 6  . azaTHIOprine (IMURAN) 50 MG tablet Take 1 tablet (50 mg total) by mouth daily. 30 tablet 3  . bumetanide (BUMEX) 1 MG tablet Take 2 mg in AM and 1 mg in PM 180 tablet 3  . cholestyramine (QUESTRAN) 4 g packet Take 1 packet (4 g total) by mouth daily. 60 each 12  . cycloSPORINE (RESTASIS) 0.05 %  ophthalmic emulsion Place 1 drop into both eyes 2 (two) times daily. 1.5 mL 3  . Ensure (ENSURE) Take 237 mLs by mouth daily.    . Ensure (ENSURE) Take 237 mLs by mouth 2 (two) times daily between meals.    . ergocalciferol (VITAMIN D2) 1.25 MG (50000 UT) capsule Take 1 capsule (50,000 Units total) by mouth once a week. 10 capsule 3  . folic acid (FOLVITE) 1 MG tablet TAKE 1 TABLET BY MOUTH EVERY DAY 30 tablet 3  . hydroxychloroquine (PLAQUENIL) 200 MG tablet Take 1 tablet (200 mg total) by mouth daily. 30 tablet 3  . Menthol, Topical Analgesic, (ICY HOT BACK EX) Apply 1 application topically daily as needed (pain).    Marland Kitchen MITIGARE 0.6 MG CAPS Take 0.6 mg by mouth daily as needed (gout).    . Multiple Vitamin (MULTIVITAMIN) tablet Take 1 tablet by mouth daily.    Marland Kitchen omeprazole (PRILOSEC) 40 MG capsule Take 40 mg by mouth daily.    . polyethylene glycol (MIRALAX / GLYCOLAX) 17 g packet Take 17 g by mouth as needed.    . predniSONE (DELTASONE) 5 MG tablet Take 1 tablet (5 mg total) by mouth daily with breakfast. 30 tablet 3  . Vericiguat (VERQUVO) 10 MG TABS Take 10 mg by mouth daily. 30 tablet 11  . amiodarone (PACERONE) 200 MG tablet Take 1 tablet (200 mg total) by mouth daily. 60 tablet 6   No current facility-administered medications for this encounter.   BP 90/60   Pulse 79   Wt 48.8 kg (107 lb 9.6 oz)   SpO2 98%   BMI 18.47 kg/m  General: NAD, frail Neck: No JVD, no thyromegaly or thyroid nodule.  Lungs: Clear to auscultation bilaterally with normal respiratory effort. CV: Nondisplaced PMI.  Heart regular S1/S2, no S3/S4, no murmur.  No peripheral edema.  No carotid bruit.  Normal pedal pulses.  Abdomen: Soft, nontender, no hepatosplenomegaly, no distention.  Skin: Intact without lesions or rashes.  Neurologic: Alert and oriented x 3.  Psych: Normal affect. Extremities: No clubbing or cyanosis.  HEENT: Normal.   Assessment/Plan: 1. Chronic systolic CHF: Nonischemic cardiomyopathy.   This has been known since 2015, cath in 2015 showed no coronary disease and cardiac MRI in 2015 showed no LGE.  Cause is uncertain, familial cardiomyopathy is a concern given nonischemic cardiomyopathy in her mother.  Cannot rule out remote viral myocarditis. She remains quite limited, primarily by fatigue, NYHA class IIIb.  Poor appetite and severe HF limitation on CPX in 3/22 is concerning, as is elevated creatinine (suspect cardiorenal syndrome).  Low BP and elevated creatinine have limited her cardiac med regimen. Most recent echo in 12/21 showed EF < 20% with normal RV systolic function. RHC (1/22) showed low filling pressures and actually a relatively preserved cardiac index of 2.22.  She is not volume overloaded on exam today.  -  Continue bumetanide 2 mg qam/1 mg qpm.   - Continue Verquvo 10 mg daily.  - Off spironolactone and Farxiga with recent rise in creatinine and soft BP.  - BMET today, if creatinine is improved could consider restarting Farxiga 10 mg daily.  - I mentioned [facility] gene testing for possible familial cardiomyopathy. She will think about this.  - Though RHC was not markedly abnormal, CPX showed severe functional limitation due to HF.  I am worried that she is near end-stage.  She is frail with CKD stage 4, I do not think that she would be a candidate for LVAD.  - With renal failure, CHF, and atrial arrhythmias, would assess for cardiac amyloidosis.  I will arrange for PYP scan as well as urine immunofixation and myeloma panel. - I would like her to do cardiac rehab.  - I will have her evaluated for barostimulation activation therapy as well as vagal nerve stimulation (Batwire and ANTHEM trial).  2. CKD: Stage 4.   - BMET today.  - She has a nephrology appt.  3. Rheumatoid arthritis: No history of lung involvement. She has been on a low dose of prednisone chronically.  - Will make sure she has referral to Dr. Amil Amen to establish care for RA.  4. Dysphagia: She has  difficulty with larger solids.  Ok with soft foods and liquids.  This has been present for some time now but seems worse after TEE.  If this does not improve, will have her see GI.  5. Atrial fibrillation/flutter: S/p TEE-DCCV in 4/22.  She is now on amiodarone.  - Can decrease amiodarone to 200 mg daily.  Check LFTs and TSH.  Will need regular eye exam.  - Continue Eliquis 5 mg bid.   Followup in 1 month.   Loralie Champagne 11/12/2020

## 2020-11-13 ENCOUNTER — Telehealth (HOSPITAL_COMMUNITY): Payer: Self-pay

## 2020-11-13 DIAGNOSIS — I509 Heart failure, unspecified: Secondary | ICD-10-CM

## 2020-11-13 MED ORDER — DAPAGLIFLOZIN PROPANEDIOL 10 MG PO TABS: 10.0000 mg | ORAL_TABLET | Freq: Every day | ORAL | 5 refills | Status: DC

## 2020-11-13 NOTE — Telephone Encounter (Signed)
-----   Message from [name], MD sent at 11/12/2020 11:37 PM EDT ----- Creatinine better.  I would like her to restart Farxiga 10 mg daily.  BMET 10 days.

## 2020-11-13 NOTE — Telephone Encounter (Signed)
Pt aware of results and to restart farxiga. Verbalized understanding. Appt made for repeat bmp

## 2020-11-14 ENCOUNTER — Encounter (HOSPITAL_COMMUNITY): Payer: Medicare PPO

## 2020-11-14 ENCOUNTER — Other Ambulatory Visit: Payer: Self-pay

## 2020-11-14 ENCOUNTER — Encounter: Payer: Self-pay | Admitting: Nurse Practitioner

## 2020-11-14 ENCOUNTER — Ambulatory Visit (INDEPENDENT_AMBULATORY_CARE_PROVIDER_SITE_OTHER): Payer: Medicare PPO | Admitting: Nurse Practitioner

## 2020-11-14 VITALS — BP 138/78 | HR 84 | Temp 98.4°F | Ht 62.0 in | Wt 107.8 lb

## 2020-11-14 DIAGNOSIS — M0609 Rheumatoid arthritis without rheumatoid factor, multiple sites: Secondary | ICD-10-CM

## 2020-11-14 DIAGNOSIS — K219 Gastro-esophageal reflux disease without esophagitis: Secondary | ICD-10-CM | POA: Diagnosis not present

## 2020-11-14 DIAGNOSIS — R21 Rash and other nonspecific skin eruption: Secondary | ICD-10-CM

## 2020-11-14 DIAGNOSIS — H04123 Dry eye syndrome of bilateral lacrimal glands: Secondary | ICD-10-CM

## 2020-11-14 DIAGNOSIS — I5022 Chronic systolic (congestive) heart failure: Secondary | ICD-10-CM

## 2020-11-14 DIAGNOSIS — Z7689 Persons encountering health services in other specified circumstances: Secondary | ICD-10-CM

## 2020-11-14 DIAGNOSIS — N184 Chronic kidney disease, stage 4 (severe): Secondary | ICD-10-CM

## 2020-11-14 DIAGNOSIS — N183 Chronic kidney disease, stage 3 unspecified: Secondary | ICD-10-CM

## 2020-11-14 LAB — IMMUNOFIXATION, URINE

## 2020-11-14 MED ORDER — CYCLOSPORINE 0.05 % OP EMUL: 1.0000 [drp] | Freq: Two times a day (BID) | OPHTHALMIC | 2 refills | Status: DC

## 2020-11-14 NOTE — Progress Notes (Signed)
I,[name],acting as a Education administrator for [employer], NP.,have documented all relevant documentation on the behalf of [employer], NP,as directed by  Bary Castilla, NP while in the presence of Bary Castilla, NP.  This visit occurred during the SARS-CoV-2 public health emergency.  Safety protocols were in place, including screening questions prior to the visit, additional usage of staff PPE, and extensive cleaning of exam room while observing appropriate contact time as indicated for disinfecting solutions.  Subjective:     [name] , female    DOB: 1946/04/11 , 75 y.o.   MRN: [id number]   Chief Complaint  Patient presents with  . Establish Care    HPI  Patient is here to establish care. She recently moved to [facility] from [place], [place] when she became sick. She currently lives with her daughter and her family. She is seen by cardiology for her CHF. She also sees a nephrologist for stage 4 CKD. She was told by the kidney specialist that there is a cyst on her liver and she would like to see someone about that. She has an area on her left leg that she would like to had addressed today. She is seeing a GI specialist sometimes next week about the hepatic lesion found on her [place] as well as GERD. She also some dry spots on her legs. She also has a rash on her leg for which she would like to see a dermatologist for to get it looked at further. She was also wanting to see a rheumatologist for her chronic RA. Patient is not having shortness of breath or chest pain.  She has a poor appetite after she got COVID last year.  She is walking to get her exercise. She doesn't cook. She cant stand for very long time. She will return for a complete physical exam.      Past Medical History:  Diagnosis Date  . Acid reflux 07/15/2020  . Arthritis 07/15/2020  . CHF (congestive heart failure) ([place])   . Colitis 07/15/2020  . 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 HFrEF (heart  failure with reduced ejection fraction) ([place]) 07/15/2020  . HTN (hypertension) 07/15/2020  . Osteoporosis 07/15/2020  . Palpitations 07/15/2020  . Rheumatoid arthritis ([place]) 07/15/2020     Family History  Problem Relation Age of Onset  . Hypertension Mother   . Diabetes Mother   . Hypertension Father   . Diabetes Father   . Arthritis Maternal Grandmother   . Lung disease Paternal Grandfather      Current Outpatient Medications:  .  acetaminophen (TYLENOL) 500 MG tablet, Take 500-1,000 mg by mouth every 6 (six) hours as needed for moderate pain., Disp: , Rfl:  .  amiodarone (PACERONE) 200 MG tablet, Take 1 tablet (200 mg total) by mouth daily., Disp: 60 tablet, Rfl: 6 .  apixaban (ELIQUIS) 2.5 MG TABS tablet, Take 1 tablet (2.5 mg total) by mouth 2 (two) times daily., Disp: 60 tablet, Rfl: 6 .  azaTHIOprine (IMURAN) 50 MG tablet, Take 1 tablet (50 mg total) by mouth daily., Disp: 30 tablet, Rfl: 3 .  bumetanide (BUMEX) 1 MG tablet, Take 2 mg in AM and 1 mg in PM, Disp: 180 tablet, Rfl: 3 .  cholestyramine (QUESTRAN) 4 g packet, Take 1 packet (4 g total) by mouth daily., Disp: 60 each, Rfl: 12 .  dapagliflozin propanediol (FARXIGA) 10 MG TABS tablet, Take 1 tablet (10 mg total) by mouth daily before breakfast., Disp: 30 tablet, Rfl: 5 .  Ensure (ENSURE), Take 237 mLs by mouth daily., Disp: , Rfl:  .  ergocalciferol (VITAMIN D2) 1.25 MG (50000 UT) capsule, Take 1 capsule (50,000 Units total) by mouth once a week., Disp: 10 capsule, Rfl: 3 .  folic acid (FOLVITE) 1 MG tablet, TAKE 1 TABLET BY MOUTH EVERY DAY, Disp: 30 tablet, Rfl: 3 .  hydroxychloroquine (PLAQUENIL) 200 MG tablet, Take 1 tablet (200 mg total) by mouth daily., Disp: 30 tablet, Rfl: 3 .  Menthol, Topical Analgesic, (ICY HOT BACK EX), Apply 1 application topically daily as needed (pain)., Disp: , Rfl:  .  MITIGARE 0.6 MG CAPS, Take 0.6 mg by mouth daily as needed (gout)., Disp: , Rfl:  .  Multiple Vitamin (MULTIVITAMIN) tablet,  Take 1 tablet by mouth daily., Disp: , Rfl:  .  omeprazole (PRILOSEC) 40 MG capsule, Take 40 mg by mouth daily., Disp: , Rfl:  .  polyethylene glycol (MIRALAX / GLYCOLAX) 17 g packet, Take 17 g by mouth as needed., Disp: , Rfl:  .  predniSONE (DELTASONE) 5 MG tablet, Take 1 tablet (5 mg total) by mouth daily with breakfast., Disp: 30 tablet, Rfl: 3 .  Vericiguat (VERQUVO) 10 MG TABS, Take 10 mg by mouth daily., Disp: 30 tablet, Rfl: 11 .  cycloSPORINE (RESTASIS) 0.05 % ophthalmic emulsion, Place 1 drop into both eyes 2 (two) times daily., Disp: 1.5 mL, Rfl: 2   Allergies  Allergen Reactions  . Baclofen Nausea And Vomiting  . Penicillins Rash     Review of Systems  Constitutional: Negative.  Negative for chills, fatigue and unexpected weight change.  HENT: Negative for congestion and ear pain.   Respiratory: Negative.  Negative for cough, chest tightness and shortness of breath.   Cardiovascular: Negative.  Negative for chest pain and palpitations.  Gastrointestinal: Negative.   Genitourinary: Negative for dysuria.  Neurological: Negative.      Today's Vitals   11/14/20 0850  BP: 138/78  Pulse: 84  Temp: 98.4 F (36.9 C)  TempSrc: Oral  Weight: 107 lb 12.8 oz (48.9 kg)  Height: 5\' 2"  (1.575 m)   Body mass index is 19.72 kg/m.   Objective:  Physical Exam Constitutional:      Appearance: Normal appearance.  HENT:     Head: Normocephalic and atraumatic.     Right Ear: Tympanic membrane, ear canal and external ear normal.     Left Ear: Tympanic membrane, ear canal and external ear normal.  Cardiovascular:     Rate and Rhythm: Normal rate and regular rhythm.     Pulses: Normal pulses.     Heart sounds: Normal heart sounds. No murmur heard.   Pulmonary:     Effort: Pulmonary effort is normal. No respiratory distress.     Breath sounds: Normal breath sounds. No wheezing.  Skin:    General: Skin is warm and dry.     Capillary Refill: Capillary refill takes less than 2  seconds.     Findings: Lesion and rash present.     Comments: 2 Rash/dry spots on her left lower leg.   Neurological:     Mental Status: She is alert and oriented to person, place, and time.  Psychiatric:        Mood and Affect: Mood normal.        Behavior: Behavior normal.        Thought Content: Thought content normal.        Judgment: Judgment normal.         Assessment And  Plan:     1. Establishing care with new doctor, encounter for -Patient is here to establish care. Martin Majestic over patient medical, family, social and surgical history. -Reviewed with patient their medications and any allergies  -Reviewed with patient their sexual orientation, drug/tobacco and alcohol use -Dicussed any new concerns with patient  -recommended patient comes in for a physical exam and complete blood work.  -Educated patient about the importance of annual screenings and immunizations.  -Advised patient to eat a healthy diet along with exercise for atleast 30-45 min atleast 4-5 days of the week.   2. Stage 3 chronic kidney disease, unspecified whether stage 3a or 3b CKD ([place]) -She is being followed by a nephrologist  -CMP recently done on 11/05/20   3. Chronic systolic CHF (congestive heart failure) ([place]) -Being follow by Dr. Aundra Dubin cardiologist  -Chronic, continue current meds.   4. Chronic GERD -Chronic, stable  -Followed by GI; appt next week.   5. Rheumatoid arthritis of multiple sites with negative rheumatoid factor (HCC) -Chronic, stable  - Ambulatory referral to Rheumatology  6. Rash - Ambulatory referral to Dermatology  7. Dry eyes -Chronic  -Advised patient to make an appt with the eye doctor.  - cycloSPORINE (RESTASIS) 0.05 % ophthalmic emulsion; Place 1 drop into both eyes 2 (two) times daily.  Dispense: 1.5 mL; Refill: 2   Follow up 3 months or as needed   Patient was given opportunity to ask questions. Patient verbalized understanding of the plan and was able to repeat key  elements of the plan. All questions were answered to their satisfaction.  Bary Castilla, NP   I, Bary Castilla, NP, have reviewed all documentation for this visit. The documentation on 11/14/20 for the exam, diagnosis, procedures, and orders are all accurate and complete.   IF YOU HAVE BEEN REFERRED TO A SPECIALIST, IT MAY TAKE 1-2 WEEKS TO SCHEDULE/PROCESS THE REFERRAL. IF YOU HAVE NOT HEARD FROM US/SPECIALIST IN TWO WEEKS, PLEASE GIVE [place] A CALL AT [phone] X [phone].   THE PATIENT IS ENCOURAGED TO PRACTICE SOCIAL DISTANCING DUE TO THE COVID-19 PANDEMIC.

## 2020-11-18 ENCOUNTER — Other Ambulatory Visit (HOSPITAL_COMMUNITY): Payer: Self-pay

## 2020-11-18 ENCOUNTER — Other Ambulatory Visit (HOSPITAL_COMMUNITY): Payer: Self-pay | Admitting: Pharmacist

## 2020-11-18 MED ORDER — APIXABAN 2.5 MG PO TABS: 2.5000 mg | ORAL_TABLET | Freq: Two times a day (BID) | ORAL | 6 refills | Status: DC

## 2020-11-19 ENCOUNTER — Telehealth (HOSPITAL_COMMUNITY): Payer: Self-pay

## 2020-11-19 NOTE — Telephone Encounter (Signed)
Spoke to pt today about scheduling a home visit this week, but due to her going to cardiology tomor for f/u then I will just see her next week for home visit.   Marylouise Stacks, EMT-Paramedic  11/19/20

## 2020-11-20 ENCOUNTER — Ambulatory Visit: Payer: Medicare PPO | Admitting: Internal Medicine

## 2020-11-20 ENCOUNTER — Other Ambulatory Visit: Payer: Self-pay

## 2020-11-20 ENCOUNTER — Encounter: Payer: Self-pay | Admitting: Internal Medicine

## 2020-11-20 VITALS — BP 72/50 | HR 90 | Ht 62.0 in | Wt 104.6 lb

## 2020-11-20 DIAGNOSIS — D6869 Other thrombophilia: Secondary | ICD-10-CM | POA: Diagnosis not present

## 2020-11-20 DIAGNOSIS — I509 Heart failure, unspecified: Secondary | ICD-10-CM | POA: Diagnosis not present

## 2020-11-20 DIAGNOSIS — I420 Dilated cardiomyopathy: Secondary | ICD-10-CM

## 2020-11-20 DIAGNOSIS — I4892 Unspecified atrial flutter: Secondary | ICD-10-CM | POA: Diagnosis not present

## 2020-11-20 DIAGNOSIS — I5020 Unspecified systolic (congestive) heart failure: Secondary | ICD-10-CM

## 2020-11-20 NOTE — Progress Notes (Signed)
Electrophysiology Office Note   Date:  11/20/2020   ID:  Tracy Nelson, DOB 06/05/1946, MRN [id number]  PCP:  Pcp, No  Cardiologist:  Dr Aundra Dubin Primary Electrophysiologist: Thompson Grayer, MD    CC: atrial flutter/ CHF   History of Present Illness: Tracy Nelson is a 75 y.o. female who presents today for electrophysiology evaluation.   She is referred by Dr Aundra Dubin. Though the chart says reason for referral is atrial flutter ablation, the patient and her family are clear that the referral is for Barostim therapy.  The patient has longstanding CHF with EF <20%.  She is followed in the advanced heart failure clinic.  She is not very active.  Medical therapy is limited by hypotension.    She had atrial flutter observed in March and was cardioverted. She remains in sinus with amiodarone.   Past Medical History:  Diagnosis Date  . Acid reflux 07/15/2020  . Arthritis 07/15/2020  . CHF (congestive heart failure) ([place])   . Colitis 07/15/2020  . Dilated cardiomyopathy ([place]) 07/15/2020  . HFrEF (heart failure with reduced ejection fraction) ([place]) 07/15/2020  . HTN (hypertension) 07/15/2020  . Osteoporosis 07/15/2020  . Palpitations 07/15/2020  . Rheumatoid arthritis ([place]) 07/15/2020   Past Surgical History:  Procedure Laterality Date  . CARDIOVERSION N/A 11/08/2020   Procedure: CARDIOVERSION;  Surgeon: [name], MD;  Location: [facility] ENDOSCOPY;  Service: Cardiovascular;  Laterality: N/A;  . RIGHT HEART CATH N/A 08/14/2020   Procedure: RIGHT HEART CATH;  Surgeon: [name], MD;  Location: [place] CV LAB;  Service: Cardiovascular;  Laterality: N/A;  . TEE WITHOUT CARDIOVERSION N/A 11/08/2020   Procedure: TRANSESOPHAGEAL ECHOCARDIOGRAM (TEE);  Surgeon: [name], MD;  Location: [facility] ENDOSCOPY;  Service: Cardiovascular;  Laterality: N/A;     Current Outpatient Medications  Medication Sig Dispense Refill  . acetaminophen (TYLENOL) 500 MG tablet Take 500-1,000 mg by mouth every  6 (six) hours as needed for moderate pain.    Marland Kitchen amiodarone (PACERONE) 200 MG tablet Take 1 tablet (200 mg total) by mouth daily. 60 tablet 6  . apixaban (ELIQUIS) 2.5 MG TABS tablet Take 1 tablet (2.5 mg total) by mouth 2 (two) times daily. 60 tablet 6  . azaTHIOprine (IMURAN) 50 MG tablet Take 1 tablet (50 mg total) by mouth daily. 30 tablet 3  . bumetanide (BUMEX) 1 MG tablet Take 2 mg in AM and 1 mg in PM 180 tablet 3  . cholestyramine (QUESTRAN) 4 g packet Take 1 packet (4 g total) by mouth daily. 60 each 12  . cycloSPORINE (RESTASIS) 0.05 % ophthalmic emulsion Place 1 drop into both eyes 2 (two) times daily. 1.5 mL 2  . dapagliflozin propanediol (FARXIGA) 10 MG TABS tablet Take 1 tablet (10 mg total) by mouth daily before breakfast. 30 tablet 5  . Ensure (ENSURE) Take 237 mLs by mouth daily.    . ergocalciferol (VITAMIN D2) 1.25 MG (50000 UT) capsule Take 1 capsule (50,000 Units total) by mouth once a week. 10 capsule 3  . folic acid (FOLVITE) 1 MG tablet TAKE 1 TABLET BY MOUTH EVERY DAY 30 tablet 3  . hydroxychloroquine (PLAQUENIL) 200 MG tablet Take 1 tablet (200 mg total) by mouth daily. 30 tablet 3  . Menthol, Topical Analgesic, (ICY HOT BACK EX) Apply 1 application topically daily as needed (pain).    Marland Kitchen MITIGARE 0.6 MG CAPS Take 0.6 mg by mouth daily as needed (gout).    . Multiple Vitamin (MULTIVITAMIN) tablet Take 1  tablet by mouth daily.    Marland Kitchen omeprazole (PRILOSEC) 40 MG capsule Take 40 mg by mouth daily.    . polyethylene glycol (MIRALAX / GLYCOLAX) 17 g packet Take 17 g by mouth as needed.    . predniSONE (DELTASONE) 5 MG tablet Take 1 tablet (5 mg total) by mouth daily with breakfast. 30 tablet 3  . Vericiguat (VERQUVO) 10 MG TABS Take 10 mg by mouth daily. 30 tablet 11   No current facility-administered medications for this visit.    Allergies:   Baclofen and Penicillins   Social History:  The patient  reports that she has never smoked. She has never used smokeless tobacco.  She reports that she does not drink alcohol and does not use drugs.   Family History:  The patient's family history includes Arthritis in her maternal grandmother; Diabetes in her father and mother; Hypertension in her father and mother; Lung disease in her paternal grandfather.    ROS:  Please see the history of present illness.   All other systems are personally reviewed and negative.    PHYSICAL EXAM: VS:  BP (!) 72/50   Pulse 90   Ht 5\' 2"  (1.575 m)   Wt 104 lb 9.6 oz (47.4 kg)   SpO2 99%   BMI 19.13 kg/m  , BMI Body mass index is 19.13 kg/m. GEN: ill appearing, very thin in no acute distress HEENT: normal Neck: no JVD  Cardiac: RRR  Respiratory:   normal work of breathing GI: soft  MS: diffuse atrophy Skin: warm and dry  Neuro:  Strength and sensation are intact Psych: euthymic mood, full affect  EKG:  EKG is ordered today. The ekg ordered today is personally reviewed and shows sinus rhythm 90 bpm, PR 192 msec, QRS 112 msec   Recent Labs: 10/18/2020: B Natriuretic Peptide 1,406.2 11/05/2020: Hemoglobin 11.6; Magnesium 2.5; Platelets 291; TSH 2.426 11/12/2020: ALT 23; BUN 32; Creatinine, Ser 2.01; Potassium 4.2; Sodium 136  personally reviewed   Lipid Panel  No results found for: CHOL, TRIG, HDL, CHOLHDL, VLDL, LDLCALC, LDLDIRECT personally reviewed   Wt Readings from Last 3 Encounters:  11/20/20 104 lb 9.6 oz (47.4 kg)  11/14/20 107 lb 12.8 oz (48.9 kg)  11/12/20 107 lb 9.6 oz (48.8 kg)     Other studies personally reviewed: Additional studies/ records that were reviewed today include: CHF clinic records, recent labs  Review of the above records today demonstrates: as above   ASSESSMENT AND PLAN:  1.  Nonischemic CM/ chronic systolic dysfunction She is at least NYHA Class IIIb.  Given comorbidities, not an ICD candidate.  QRS is narrow and therefore now a candidate for CRT. Medical therapy is limited by bradycardia.   Unfortunately, I doubt that she will be  a candidate for Barostim therapy.  Her pro BNP has previously been too high.  I will repeat this today and have her return in 2 weeks to see Oda Kilts for further discussion. She does not want to be in clinical trial and likely is too frail for clinical trial, so An[employer] and [employer] are not options for her.  Her prognosis is poor.  I agree with Dr Aundra Dubin that she is likely near end-stage.  2.  Atrial flutter Typical atrial flutter observed.  Though Dr Oleh Genin recent note also mentions Afib, I only see atrial flutter documented.  She is anticoagulated with eliquis.  She is maintaining sinus with amiodarone. Given her advanced cardiac dysfunction, I feel the risks for ablation would  outweigh benefit at this time. Continue amiodarone with close follow-up to avoid toxicity with this medicine.  Risks, benefits and potential toxicities for medications prescribed and/or refilled reviewed with patient today.    Her prognosis is poor.  Goals of care conversations would be valuable.  Follow-up:  EP PA in 2 weeks to follow-up on Barostim discussion  I will see as needed going forward    Signed, Thompson Grayer, MD  11/20/2020 10:28 AM     [place] [address] [place] [address] [phone] (office) [phone] (fax)

## 2020-11-20 NOTE — Patient Instructions (Addendum)
Medication Instructions:  Your physician recommends that you continue on your current medications as directed. Please refer to the Current Medication list given to you today.  Labwork: Pro BNP  Testing/Procedures: None ordered.  Follow-Up: Your physician wants you to follow-up in: 2 weeks with     Legrand Como "Jonni Sanger" Chalmers Cater, PA-C    Any Other Special Instructions Will Be Listed Below (If Applicable).  If you need a refill on your cardiac medications before your next appointment, please call your pharmacy.

## 2020-11-21 LAB — PRO B NATRIURETIC PEPTIDE: NT-Pro BNP: 6498 pg/mL — ABNORMAL HIGH (ref 0–738)

## 2020-11-22 ENCOUNTER — Other Ambulatory Visit: Payer: Self-pay

## 2020-11-22 ENCOUNTER — Ambulatory Visit (HOSPITAL_COMMUNITY)
Admission: RE | Admit: 2020-11-22 | Discharge: 2020-11-22 | Disposition: A | Discharge status: Home / Self Care | Payer: Medicare PPO | Source: Ambulatory Visit | Attending: Cardiology | Admitting: Cardiology

## 2020-11-22 DIAGNOSIS — I509 Heart failure, unspecified: Secondary | ICD-10-CM | POA: Diagnosis not present

## 2020-11-22 LAB — BASIC METABOLIC PANEL
Anion gap: 8 (ref 5–15)
BUN: 47 mg/dL — ABNORMAL HIGH (ref 8–23)
CO2: 27 mmol/L (ref 22–32)
Calcium: 9.1 mg/dL (ref 8.9–10.3)
Chloride: 102 mmol/L (ref 98–111)
Creatinine, Ser: 2.25 mg/dL — ABNORMAL HIGH (ref 0.44–1.00)
GFR, Estimated: 22 mL/min — ABNORMAL LOW (ref >60)
Glucose, Bld: 151 mg/dL — ABNORMAL HIGH (ref 70–99)
Potassium: 3.7 mmol/L (ref 3.5–5.1)
Sodium: 137 mmol/L (ref 135–145)

## 2020-11-26 ENCOUNTER — Telehealth (HOSPITAL_COMMUNITY): Payer: Self-pay | Admitting: Student-PharmD

## 2020-11-26 NOTE — Telephone Encounter (Signed)
Cardiac Rehab Medication Review by a Pharmacist  Does the patient  feel that his/her medications are working for him/her?  yes  Has the patient been experiencing any side effects to the medications prescribed?  no  Does the patient measure his/her own blood pressure or blood glucose at home?  yes - blood pressure 90/60s  Does the patient have any problems obtaining medications due to transportation or finances?   Yes but does receive some financial support for 1 of her medications   Understanding of regimen: good Understanding of indications: good Potential of compliance: good   Mercy Riding, PharmD PGY1 Acute Care Pharmacy Resident

## 2020-11-27 ENCOUNTER — Telehealth (HOSPITAL_COMMUNITY): Payer: Self-pay | Admitting: Vascular Surgery

## 2020-11-27 NOTE — Telephone Encounter (Signed)
Called pt to give PYP appt, asked pt to cal back to confirm

## 2020-11-28 ENCOUNTER — Other Ambulatory Visit (HOSPITAL_COMMUNITY): Payer: Self-pay

## 2020-11-28 NOTE — Progress Notes (Signed)
Paramedicine Encounter    [name], female    DOB: Dec 15, 1945, 75 y.o.   MRN: [id number]   Patient Care Team: Pcp, No as PCP - General Bebe Liter Connye Burkitt, LCSW as Social Worker (Licensed [employer] representative)  Patient Active Problem List   Diagnosis Date Noted  . Hypotension 10/22/2020  . Atypical chest pain 10/22/2020  . CKD (chronic kidney disease), stage III ([place]) 10/22/2020  . Nausea & vomiting 10/22/2020  . Physical deconditioning 10/22/2020  . Atrial flutter ([place]) 10/18/2020  . Arthritis 07/15/2020  . GERD (gastroesophageal reflux disease) 07/15/2020  . Colitis 07/15/2020  . Non-ischemic cardiomyopathy ([place]) 07/15/2020  . HFrEF (heart failure with reduced ejection fraction) ([place]) 07/15/2020  . HTN (hypertension) 07/15/2020  . Osteoporosis 07/15/2020  . Palpitations 07/15/2020  . Rheumatoid arthritis ([place]) 07/15/2020    Current Outpatient Medications:  .  acetaminophen (TYLENOL) 500 MG tablet, Take 500-1,000 mg by mouth every 6 (six) hours as needed for moderate pain., Disp: , Rfl:  .  amiodarone (PACERONE) 200 MG tablet, Take 1 tablet (200 mg total) by mouth daily., Disp: 60 tablet, Rfl: 6 .  apixaban (ELIQUIS) 2.5 MG TABS tablet, Take 1 tablet (2.5 mg total) by mouth 2 (two) times daily., Disp: 60 tablet, Rfl: 6 .  azaTHIOprine (IMURAN) 50 MG tablet, Take 1 tablet (50 mg total) by mouth daily., Disp: 30 tablet, Rfl: 3 .  bumetanide (BUMEX) 1 MG tablet, Take 2 mg in AM and 1 mg in PM, Disp: 180 tablet, Rfl: 3 .  cholestyramine (QUESTRAN) 4 g packet, Take 1 packet (4 g total) by mouth daily., Disp: 60 each, Rfl: 12 .  cycloSPORINE (RESTASIS) 0.05 % ophthalmic emulsion, Place 1 drop into both eyes 2 (two) times daily., Disp: 1.5 mL, Rfl: 2 .  dapagliflozin propanediol (FARXIGA) 10 MG TABS tablet, Take 1 tablet (10 mg total) by mouth daily before breakfast., Disp: 30 tablet, Rfl: 5 .  Ensure (ENSURE), Take 237 mLs by mouth 2 (two) times daily between meals.,  Disp: , Rfl:  .  ergocalciferol (VITAMIN D2) 1.25 MG (50000 UT) capsule, Take 1 capsule (50,000 Units total) by mouth once a week., Disp: 10 capsule, Rfl: 3 .  folic acid (FOLVITE) 1 MG tablet, TAKE 1 TABLET BY MOUTH EVERY DAY, Disp: 30 tablet, Rfl: 3 .  hydroxychloroquine (PLAQUENIL) 200 MG tablet, Take 1 tablet (200 mg total) by mouth daily., Disp: 30 tablet, Rfl: 3 .  Menthol, Topical Analgesic, (ICY HOT BACK EX), Apply 1 application topically daily as needed (pain)., Disp: , Rfl:  .  MITIGARE 0.6 MG CAPS, Take 0.6 mg by mouth daily as needed (gout)., Disp: , Rfl:  .  Multiple Vitamin (MULTIVITAMIN) tablet, Take 1 tablet by mouth daily., Disp: , Rfl:  .  omeprazole (PRILOSEC) 40 MG capsule, Take 40 mg by mouth daily., Disp: , Rfl:  .  polyethylene glycol (MIRALAX / GLYCOLAX) 17 g packet, Take 17 g by mouth as needed., Disp: , Rfl:  .  predniSONE (DELTASONE) 5 MG tablet, Take 1 tablet (5 mg total) by mouth daily with breakfast., Disp: 30 tablet, Rfl: 3 .  Vericiguat (VERQUVO) 10 MG TABS, Take 10 mg by mouth daily., Disp: 30 tablet, Rfl: 11 Allergies  Allergen Reactions  . Baclofen Nausea And Vomiting  . Penicillins Rash      Social History   Socioeconomic History  . Marital status: Widowed    Spouse name: Not on file  . Number of children: Not on file  .  Years of education: Not on file  . Highest education level: Not on file  Occupational History  . Not on file  Tobacco Use  . Smoking status: Never Smoker  . Smokeless tobacco: Never Used  Substance and Sexual Activity  . Alcohol use: Never  . Drug use: Never  . Sexual activity: Not Currently  Other Topics Concern  . Not on file  Social History Narrative  . Not on file   Social Determinants of Health   Financial Resource Strain: Low Risk   . Difficulty of Paying Living Expenses: Not very hard  Food Insecurity: No Food Insecurity  . Worried About Charity fundraiser in the Last Year: Never true  . Ran Out of Food in the  Last Year: Never true  Transportation Needs: No Transportation Needs  . Lack of Transportation (Medical): No  . Lack of Transportation (Non-Medical): No  Physical Activity: Not on file  Stress: Not on file  Social Connections: Not on file  Intimate Partner Violence: Not on file    Physical Exam      Future Appointments  Date Time Provider [place]  11/29/2020  8:30 AM Milus Banister, MD [facility]  12/04/2020 12:10 PM Shirley Friar, PA-C [facility]  12/05/2020  2:15 PM MC-CARDIAC PHASE II ORIENT MC-REHSC None  12/06/2020 11:00 AM MC-NM INJ 1 MC-NM [place]  12/06/2020 12:00 PM MC-NM 1 MC-NM [place]  12/09/2020  3:15 PM MC-CREHA PHASE II EXC MC-REHSC None  12/11/2020  3:15 PM MC-CREHA PHASE II EXC MC-REHSC None  12/13/2020  3:15 PM MC-CREHA PHASE II EXC MC-REHSC None  12/16/2020  3:15 PM MC-CREHA PHASE II EXC MC-REHSC None  12/17/2020  3:20 PM [name], MD MC-HVSC None  12/18/2020  3:15 PM MC-CREHA PHASE II EXC MC-REHSC None  12/20/2020  3:15 PM MC-CREHA PHASE II EXC MC-REHSC None  12/23/2020  3:15 PM MC-CREHA PHASE II EXC MC-REHSC None  12/25/2020  3:15 PM MC-CREHA PHASE II EXC MC-REHSC None  12/27/2020  3:15 PM MC-CREHA PHASE II EXC MC-REHSC None  12/30/2020  3:15 PM MC-CREHA PHASE II EXC MC-REHSC None  12/31/2020  8:45 AM Rice, Resa Miner, MD CR-GSO None  01/01/2021  3:15 PM MC-CREHA PHASE II EXC MC-REHSC None  01/03/2021  3:15 PM MC-CREHA PHASE II EXC MC-REHSC None  01/08/2021  3:15 PM MC-CREHA PHASE II EXC MC-REHSC None  01/10/2021  3:15 PM MC-CREHA PHASE II EXC MC-REHSC None  01/13/2021  3:15 PM MC-CREHA PHASE II EXC MC-REHSC None  01/15/2021  3:15 PM MC-CREHA PHASE II EXC MC-REHSC None  01/17/2021  3:15 PM MC-CREHA PHASE II EXC MC-REHSC None  01/20/2021  3:15 PM MC-CREHA PHASE II EXC MC-REHSC None  01/22/2021  3:15 PM MC-CREHA PHASE II EXC MC-REHSC None  01/24/2021  3:15 PM MC-CREHA PHASE II EXC MC-REHSC None  01/27/2021  3:15 PM MC-CREHA PHASE II EXC  MC-REHSC None  01/29/2021  3:15 PM MC-CREHA PHASE II EXC MC-REHSC None  01/31/2021  3:15 PM MC-CREHA PHASE II EXC MC-REHSC None  02/13/2021  9:00 AM Ghumman, Ramandeep, NP TIMA-TIMA None    BP 92/62   Pulse 84   Resp 18   Wt 105 lb (47.6 kg)   SpO2 98%   BMI 19.20 kg/m   Weight yesterday-108 @ doctor Last visit weight-107  Pt reports she is doing ok. She reports her breathing is same, sometimes she has flickers in her chest but not pains.  She is unable to tolerate laying flat due  to sob, she is being seen.  She is checking her b/p at home and the highest numbers she has seen is in the 90s.  Lungs clear. No edema noted.  She seems to be better-when she walked in from the bedroom to kitchen she was less sob than she has been.   Marylouise Stacks, [place] [facility] Paramedic  11/28/20

## 2020-11-29 ENCOUNTER — Ambulatory Visit: Payer: Medicare PPO | Admitting: Gastroenterology

## 2020-11-29 ENCOUNTER — Other Ambulatory Visit: Payer: Self-pay

## 2020-11-29 ENCOUNTER — Encounter: Payer: Self-pay | Admitting: Gastroenterology

## 2020-11-29 VITALS — BP 96/54 | HR 60 | Ht 63.0 in | Wt 107.0 lb

## 2020-11-29 DIAGNOSIS — R1310 Dysphagia, unspecified: Secondary | ICD-10-CM | POA: Diagnosis not present

## 2020-11-29 MED ORDER — NYSTATIN 100000 UNIT/ML MT SUSP: 5.0000 mL | Freq: Four times a day (QID) | OROMUCOSAL | 0 refills | Status: DC

## 2020-11-29 NOTE — Patient Instructions (Signed)
We have sent the following medications to your pharmacy for you to pick up at your convenience: Nystatin suspension 5/ml, swish and swallow four times a day x 2 weeks.   Call in 2-3 weeks with an update on your symptoms.   Thank you for trusting me with your gastrointestinal care!    Owens Loffler, MD

## 2020-11-29 NOTE — Progress Notes (Signed)
HPI: This is a very pleasant 75 year old woman who was referred to me by [name], MD  to evaluate dysphagia, swallowing difficulty.    She is here with her daughter or granddaughter today.   She has severe nonischemic Congestive heart failure with an ejection fraction of less than 20%, followed in the "advanced heart failure clinic".  She has atrial flutter.  Recent cardiology evaluation last week documented that "her prognosis is poor" and that she is "likely near end stage "   For 2 to 3 months she has had some swallowing difficulties.  She describes it as liquid backing up in her mouth after she takes a second sip.  She sometimes feels that food is not going down completely smoothly.  Her weight is completely stable.  She has no heartburn.  This problem is new for her.  She periodically gets vaginal yeast infections and she actually had one 3 weeks ago which required Monistat topical treatment.  She is not sure if she has been on any antibiotics recently.  Esophagus cancer does not run in her family, her father had ulcer disease, one of her cousins had stomach cancer.  She also described a sinking feeling in her stomach when she eats.  She feels that her stomach is visibly sunken in after she eats a meal.   Review of systems: Pertinent positive and negative review of systems were noted in the above HPI section. All other review negative.   Past Medical History:  Diagnosis Date  . Acid reflux 07/15/2020  . Arthritis 07/15/2020  . CHF (congestive heart failure) ([place])   . Colitis 07/15/2020  . Dilated cardiomyopathy ([place]) 07/15/2020  . HFrEF (heart failure with reduced ejection fraction) ([place]) 07/15/2020  . HTN (hypertension) 07/15/2020  . Osteoporosis 07/15/2020  . Palpitations 07/15/2020  . Rheumatoid arthritis ([place]) 07/15/2020    Past Surgical History:  Procedure Laterality Date  . CARDIOVERSION N/A 11/08/2020   Procedure: CARDIOVERSION;  Surgeon: [name], MD;   Location: [facility] ENDOSCOPY;  Service: Cardiovascular;  Laterality: N/A;  . RIGHT HEART CATH N/A 08/14/2020   Procedure: RIGHT HEART CATH;  Surgeon: [name], MD;  Location: [place] CV LAB;  Service: Cardiovascular;  Laterality: N/A;  . TEE WITHOUT CARDIOVERSION N/A 11/08/2020   Procedure: TRANSESOPHAGEAL ECHOCARDIOGRAM (TEE);  Surgeon: [name], MD;  Location: [facility] ENDOSCOPY;  Service: Cardiovascular;  Laterality: N/A;    Current Outpatient Medications  Medication Sig Dispense Refill  . acetaminophen (TYLENOL) 500 MG tablet Take 500-1,000 mg by mouth every 6 (six) hours as needed for moderate pain.    Marland Kitchen amiodarone (PACERONE) 200 MG tablet Take 1 tablet (200 mg total) by mouth daily. 60 tablet 6  . apixaban (ELIQUIS) 2.5 MG TABS tablet Take 1 tablet (2.5 mg total) by mouth 2 (two) times daily. 60 tablet 6  . azaTHIOprine (IMURAN) 50 MG tablet Take 1 tablet (50 mg total) by mouth daily. 30 tablet 3  . bumetanide (BUMEX) 1 MG tablet Take 2 mg in AM and 1 mg in PM 180 tablet 3  . cholestyramine (QUESTRAN) 4 g packet Take 1 packet (4 g total) by mouth daily. 60 each 12  . cycloSPORINE (RESTASIS) 0.05 % ophthalmic emulsion Place 1 drop into both eyes 2 (two) times daily. 1.5 mL 2  . dapagliflozin propanediol (FARXIGA) 10 MG TABS tablet Take 1 tablet (10 mg total) by mouth daily before breakfast. 30 tablet 5  . Ensure (ENSURE) Take 237 mLs by mouth  2 (two) times daily between meals.    . ergocalciferol (VITAMIN D2) 1.25 MG (50000 UT) capsule Take 1 capsule (50,000 Units total) by mouth once a week. 10 capsule 3  . folic acid (FOLVITE) 1 MG tablet TAKE 1 TABLET BY MOUTH EVERY DAY 30 tablet 3  . hydroxychloroquine (PLAQUENIL) 200 MG tablet Take 1 tablet (200 mg total) by mouth daily. 30 tablet 3  . Menthol, Topical Analgesic, (ICY HOT BACK EX) Apply 1 application topically daily as needed (pain).    Marland Kitchen MITIGARE 0.6 MG CAPS Take 0.6 mg by mouth daily as needed (gout).    . Multiple Vitamin  (MULTIVITAMIN) tablet Take 1 tablet by mouth daily.    Marland Kitchen omeprazole (PRILOSEC) 40 MG capsule Take 40 mg by mouth daily.    . polyethylene glycol (MIRALAX / GLYCOLAX) 17 g packet Take 17 g by mouth as needed.    . predniSONE (DELTASONE) 5 MG tablet Take 1 tablet (5 mg total) by mouth daily with breakfast. 30 tablet 3  . Vericiguat (VERQUVO) 10 MG TABS Take 10 mg by mouth daily. 30 tablet 11   No current facility-administered medications for this visit.    Allergies as of 11/29/2020 - Review Complete 11/29/2020  Allergen Reaction Noted  . Baclofen Nausea And Vomiting 08/08/2020  . Penicillins Rash 08/08/2020    Family History  Problem Relation Age of Onset  . Hypertension Mother   . Diabetes Mother   . Hypertension Father   . Diabetes Father   . Arthritis Maternal Grandmother   . Lung disease Paternal Grandfather     Social History   Socioeconomic History  . Marital status: Widowed    Spouse name: Not on file  . Number of children: Not on file  . Years of education: Not on file  . Highest education level: Not on file  Occupational History  . Not on file  Tobacco Use  . Smoking status: Never Smoker  . Smokeless tobacco: Never Used  Substance and Sexual Activity  . Alcohol use: Never  . Drug use: Never  . Sexual activity: Not Currently  Other Topics Concern  . Not on file  Social History Narrative  . Not on file   Social Determinants of Health   Financial Resource Strain: Low Risk   . Difficulty of Paying Living Expenses: Not very hard  Food Insecurity: No Food Insecurity  . Worried About Charity fundraiser in the Last Year: Never true  . Ran Out of Food in the Last Year: Never true  Transportation Needs: No Transportation Needs  . Lack of Transportation (Medical): No  . Lack of Transportation (Non-Medical): No  Physical Activity: Not on file  Stress: Not on file  Social Connections: Not on file  Intimate Partner Violence: Not on file     Physical  Exam: BP (!) 96/54   Pulse 60   Ht 5\' 3"  (1.6 m)   Wt 107 lb (48.5 kg)   BMI 18.95 kg/m  Constitutional: Elderly, frail Psychiatric: alert and oriented x3 Eyes: extraocular movements intact Mouth: Upper full dentures in place, no dentures on the bottom.  She does have some small food particles left in her mouth from some eggs this morning but it also looks like she has some white patches along her buccal mucosa and back in her throat. Neck: supple no lymphadenopathy Cardiovascular: heart regular rate and rhythm Lungs: clear to auscultation bilaterally Abdomen: soft, nontender, nondistended, no obvious ascites, no peritoneal signs, normal bowel sounds  Extremities: no lower extremity edema bilaterally Skin: no lesions on visible extremities   Assessment and plan: 75 y.o. female with severe heart disease, apparent thrush in her mouth, dysphagia and swallowing difficulty  I explained to her that she has a very weak heart and any endoscopic testing should only be done if it is an emergent situation.  I think that she has thrush in her mouth and may also have esophageal yeast infection.  This correlates with a vaginal yeast infection which she had 3 to 4 weeks ago.  Diflucan is potentially risky with her cardiac meds and so instead she will try nystatin swish and swallow 4 times daily for 2 weeks and she will call back to report on her response in 3 to 4 weeks.  Please see the "Patient Instructions" section for addition details about the plan.   Owens Loffler, MD [facility] 11/29/2020, 8:32 AM  Cc: [name], MD  Total time on date of encounter was 45  minutes (this included time spent preparing to see the patient reviewing records; obtaining and/or reviewing separately obtained history; performing a medically appropriate exam and/or evaluation; counseling and educating the patient and family if present; ordering medications, tests or procedures if applicable; and  documenting clinical information in the health record).

## 2020-12-04 ENCOUNTER — Other Ambulatory Visit (HOSPITAL_COMMUNITY): Payer: Self-pay | Admitting: Cardiology

## 2020-12-04 ENCOUNTER — Telehealth (HOSPITAL_COMMUNITY): Payer: Self-pay | Admitting: *Deleted

## 2020-12-04 ENCOUNTER — Other Ambulatory Visit: Payer: Self-pay

## 2020-12-04 ENCOUNTER — Ambulatory Visit: Payer: Medicare PPO | Admitting: Student

## 2020-12-04 ENCOUNTER — Encounter: Payer: Self-pay | Admitting: Student

## 2020-12-04 VITALS — BP 90/62 | HR 78 | Ht 64.0 in | Wt 108.2 lb

## 2020-12-04 DIAGNOSIS — I4892 Unspecified atrial flutter: Secondary | ICD-10-CM

## 2020-12-04 DIAGNOSIS — I5022 Chronic systolic (congestive) heart failure: Secondary | ICD-10-CM

## 2020-12-04 NOTE — Progress Notes (Signed)
PCP:  Pcp, No Primary Cardiologist: Dr. Aundra Dubin.  Electrophysiologist: None, she was referred to EP for barostim consideration, and placed on Dr. Bonita Quin schedule initially. Not candidate for EP procedures at this time.   Tracy Nelson is a 75 y.o. female seen today at request of Dr. Aundra Dubin and Dr. Rayann Heman for consideration for barostim therapy.  Since last being seen in our clinic the patient reports doing about the same. She has presented solely for barostim consideration, and with labwork that precludes her, I let her and her son know this at the beginning of the visit. Overall she remains very deconditioning. Has PYP scan Friday. Has been seen by [facility] with dysphagia and found to have esophageal yeast infection treating with Nystatin.  The patient states she has made her wishes in the setting of an emergency known to her family, but does not elaborate further. She asks how long she has and what other options exist for her.   Past Medical History:  Diagnosis Date  . Acid reflux 07/15/2020  . Arthritis 07/15/2020  . CHF (congestive heart failure) ([place])   . Colitis 07/15/2020  . Dilated cardiomyopathy ([place]) 07/15/2020  . HFrEF (heart failure with reduced ejection fraction) ([place]) 07/15/2020  . HTN (hypertension) 07/15/2020  . Osteoporosis 07/15/2020  . Palpitations 07/15/2020  . Rheumatoid arthritis ([place]) 07/15/2020   Past Surgical History:  Procedure Laterality Date  . CARDIOVERSION N/A 11/08/2020   Procedure: CARDIOVERSION;  Surgeon: [name], MD;  Location: [facility] ENDOSCOPY;  Service: Cardiovascular;  Laterality: N/A;  . RIGHT HEART CATH N/A 08/14/2020   Procedure: RIGHT HEART CATH;  Surgeon: [name], MD;  Location: [facility] LAB;  Service: Cardiovascular;  Laterality: N/A;  . TEE WITHOUT CARDIOVERSION N/A 11/08/2020   Procedure: TRANSESOPHAGEAL ECHOCARDIOGRAM (TEE);  Surgeon: [name], MD;  Location: [facility] ENDOSCOPY;  Service: Cardiovascular;  Laterality: N/A;     Current Outpatient Medications  Medication Sig Dispense Refill  . acetaminophen (TYLENOL) 500 MG tablet Take 500-1,000 mg by mouth every 6 (six) hours as needed for moderate pain.    Marland Kitchen amiodarone (PACERONE) 200 MG tablet Take 1 tablet (200 mg total) by mouth daily. 60 tablet 6  . apixaban (ELIQUIS) 2.5 MG TABS tablet Take 1 tablet (2.5 mg total) by mouth 2 (two) times daily. 60 tablet 6  . azaTHIOprine (IMURAN) 50 MG tablet Take 1 tablet (50 mg total) by mouth daily. 30 tablet 3  . bumetanide (BUMEX) 1 MG tablet Take 2 mg in AM and 1 mg in PM 180 tablet 3  . cholestyramine (QUESTRAN) 4 g packet Take 1 packet (4 g total) by mouth daily. 60 each 12  . cycloSPORINE (RESTASIS) 0.05 % ophthalmic emulsion Place 1 drop into both eyes 2 (two) times daily. 1.5 mL 2  . dapagliflozin propanediol (FARXIGA) 10 MG TABS tablet Take 1 tablet (10 mg total) by mouth daily before breakfast. 30 tablet 5  . Ensure (ENSURE) Take 237 mLs by mouth 2 (two) times daily between meals.    . ergocalciferol (VITAMIN D2) 1.25 MG (50000 UT) capsule Take 1 capsule (50,000 Units total) by mouth once a week. 10 capsule 3  . folic acid (FOLVITE) 1 MG tablet TAKE 1 TABLET BY MOUTH EVERY DAY 30 tablet 3  . hydroxychloroquine (PLAQUENIL) 200 MG tablet Take 1 tablet (200 mg total) by mouth daily. 30 tablet 3  . Menthol, Topical Analgesic, (ICY HOT BACK EX) Apply 1 application topically daily as needed (pain).    Marland Kitchen  MITIGARE 0.6 MG CAPS Take 0.6 mg by mouth daily as needed (gout).    . Multiple Vitamin (MULTIVITAMIN) tablet Take 1 tablet by mouth daily.    Marland Kitchen nystatin (MYCOSTATIN) 100000 UNIT/ML suspension Take 5 mLs (500,000 Units total) by mouth 4 (four) times daily for 14 days. Please swish and swallow 280 mL 0  . omeprazole (PRILOSEC) 40 MG capsule Take 40 mg by mouth daily.    . polyethylene glycol (MIRALAX / GLYCOLAX) 17 g packet Take 17 g by mouth as needed.    . predniSONE (DELTASONE) 5 MG tablet Take 1 tablet (5 mg total)  by mouth daily with breakfast. 30 tablet 3  . Vericiguat (VERQUVO) 10 MG TABS Take 10 mg by mouth daily. 30 tablet 11   No current facility-administered medications for this visit.    Allergies  Allergen Reactions  . Baclofen Nausea And Vomiting  . Penicillins Rash    Social History   Socioeconomic History  . Marital status: Widowed    Spouse name: Not on file  . Number of children: Not on file  . Years of education: Not on file  . Highest education level: Not on file  Occupational History  . Not on file  Tobacco Use  . Smoking status: Never Smoker  . Smokeless tobacco: Never Used  Substance and Sexual Activity  . Alcohol use: Never  . Drug use: Never  . Sexual activity: Not Currently  Other Topics Concern  . Not on file  Social History Narrative  . Not on file   Social Determinants of Health   Financial Resource Strain: Low Risk   . Difficulty of Paying Living Expenses: Not very hard  Food Insecurity: No Food Insecurity  . Worried About Charity fundraiser in the Last Year: Never true  . Ran Out of Food in the Last Year: Never true  Transportation Needs: No Transportation Needs  . Lack of Transportation (Medical): No  . Lack of Transportation (Non-Medical): No  Physical Activity: Not on file  Stress: Not on file  Social Connections: Not on file  Intimate Partner Violence: Not on file     Review of Systems: General: No chills, fever, night sweats or weight changes  Cardiovascular:  No chest pain, dyspnea on exertion, edema, orthopnea, palpitations, paroxysmal nocturnal dyspnea Dermatological: No rash, lesions or masses Respiratory: No cough, dyspnea Urologic: No hematuria, dysuria Abdominal: No nausea, vomiting, diarrhea, bright red blood per rectum, melena, or hematemesis Neurologic: No visual changes, weakness, changes in mental status All other systems reviewed and are otherwise negative except as noted above.  Physical Exam: There were no vitals filed  for this visit.  GEN- The patient is well appearing, alert and oriented x 3 today.   HEENT: normocephalic, atraumatic; sclera clear, conjunctiva pink; hearing intact; oropharynx clear; neck supple, no JVP Lymph- no cervical lymphadenopathy Lungs- Clear to ausculation bilaterally, normal work of breathing.  No wheezes, rales, rhonchi Heart- Regular rate and rhythm, no murmurs, rubs or gallops, PMI not laterally displaced GI- soft, non-tender, non-distended, bowel sounds present, no hepatosplenomegaly Extremities- no clubbing, cyanosis, or edema; DP/PT/radial pulses 2+ bilaterally MS- no significant deformity or atrophy Skin- warm and dry, no rash or lesion Psych- euthymic mood, full affect Neuro- strength and sensation are intact  EKG is not ordered.   Additional studies reviewed include: Previous EP office visit  Assessment and Plan:  1. Chronic systolic CHF Echo 28/4132 LVEF <20%. TEE 11/08/20 with LVEF < 22% and diffuse HK NYHA IIIb.  Volume status managed with bumex She is cachectic, has a poor appetite, and severe HF limitation by CPX.  Off spiro, BB, ACE/ARB/ARNi with hypotension On Verquvo 10 mg daily  She is not a candidate for Barostim or Batwire therapy due to elevated Pro-BNP at > 6000. I suspect her HF is too advanced, and we would be very unlikely to get her BNP down to an acceptable level.   2. Paroxysmal atrial flutter S/p TEE/DCC at the beginning of the month.  Now on amiodarone followed by Dr. Aundra Dubin.  Not candidate for EP procedures at this time given her severe cardiac dysfunction per Dr. Bonita Quin note.  Continue eliquis 5 mg BID for CHA2DS2VASC of at least 6.    3. Goals of Care Briefly discussed overall poor prognosis with her today. She states her wishes are known by family, I encouraged her to have them in writing.  She is very hopeful that some treatment will be available or appropriate for her. Though, she implies that she does not like the idea of having  to take "even more pills"  Pt sees Dr. Aundra Dubin again 5/10. PYP scan scheduled for Friday.   EP to see as needed.   Shirley Friar, PA-C  12/04/20 11:09 AM

## 2020-12-05 ENCOUNTER — Encounter (HOSPITAL_COMMUNITY)
Admission: RE | Admit: 2020-12-05 | Discharge: 2020-12-05 | Disposition: A | Discharge status: Home / Self Care | Payer: Medicare PPO | Source: Ambulatory Visit | Attending: Cardiology | Admitting: Cardiology

## 2020-12-05 ENCOUNTER — Telehealth (HOSPITAL_COMMUNITY): Payer: Self-pay | Admitting: *Deleted

## 2020-12-05 VITALS — BP 96/67 | HR 86 | Ht 62.0 in | Wt 106.5 lb

## 2020-12-05 DIAGNOSIS — I5022 Chronic systolic (congestive) heart failure: Secondary | ICD-10-CM

## 2020-12-05 NOTE — Telephone Encounter (Signed)
Spoke with patient. Completed health history. Confirmed appointment. Patient says she is coming to appointment by transport. Ms Peffley says that she is receiving her last home health visit today.Barnet Pall, RN,BSN 12/05/2020 12:11 PM

## 2020-12-06 ENCOUNTER — Encounter (HOSPITAL_COMMUNITY)
Admission: RE | Admit: 2020-12-06 | Discharge: 2020-12-06 | Disposition: A | Discharge status: Home / Self Care | Payer: Medicare PPO | Source: Ambulatory Visit | Attending: Cardiology | Admitting: Cardiology

## 2020-12-06 ENCOUNTER — Telehealth (HOSPITAL_COMMUNITY): Payer: Self-pay

## 2020-12-06 ENCOUNTER — Other Ambulatory Visit: Payer: Self-pay

## 2020-12-06 ENCOUNTER — Encounter (HOSPITAL_COMMUNITY): Payer: Self-pay

## 2020-12-06 DIAGNOSIS — E859 Amyloidosis, unspecified: Secondary | ICD-10-CM | POA: Insufficient documentation

## 2020-12-06 MED ORDER — TECHNETIUM TC 99M PYROPHOSPHATE
25.2000 | Freq: Once | INTRAVENOUS | Status: AC | PRN
  Administered 2020-12-06: 25.2 via INTRAVENOUS
  Filled 2020-12-06: qty 26

## 2020-12-06 NOTE — Telephone Encounter (Signed)
Pt cardiac rehab nurse stated that pt only wanted to do Mondays and Wednesdays of cardiac rehab, went in pt chart and canceled all of her Friday cardiac rehab sessions.

## 2020-12-06 NOTE — Progress Notes (Signed)
Cardiac Individual Treatment Plan  Patient Details  Name: Tracy Nelson MRN: [id number] Date of Birth: July 23, 1946 Referring Provider:   Flowsheet Row CARDIAC REHAB PHASE II ORIENTATION from 12/05/2020 in [place]  Referring Provider Loralie Champagne, MD       Initial Encounter Date:  [place] PHASE II ORIENTATION from 12/05/2020 in [place]  Date 12/05/20       Visit Diagnosis: Chronic systolic congestive heart failure ([place])  Patient's Home Medications on Admission:  Current Outpatient Medications:  .  acetaminophen (TYLENOL) 500 MG tablet, Take 500-1,000 mg by mouth every 6 (six) hours as needed for moderate pain., Disp: , Rfl:  .  amiodarone (PACERONE) 200 MG tablet, Take 1 tablet (200 mg total) by mouth daily., Disp: 60 tablet, Rfl: 6 .  apixaban (ELIQUIS) 2.5 MG TABS tablet, Take 1 tablet (2.5 mg total) by mouth 2 (two) times daily., Disp: 60 tablet, Rfl: 6 .  azaTHIOprine (IMURAN) 50 MG tablet, Take 1 tablet (50 mg total) by mouth daily., Disp: 30 tablet, Rfl: 3 .  bumetanide (BUMEX) 1 MG tablet, Take 2 mg in AM and 1 mg in PM, Disp: 180 tablet, Rfl: 3 .  cholestyramine (QUESTRAN) 4 g packet, Take 1 packet (4 g total) by mouth daily., Disp: 60 each, Rfl: 12 .  cycloSPORINE (RESTASIS) 0.05 % ophthalmic emulsion, Place 1 drop into both eyes 2 (two) times daily., Disp: 1.5 mL, Rfl: 2 .  dapagliflozin propanediol (FARXIGA) 10 MG TABS tablet, Take 1 tablet (10 mg total) by mouth daily before breakfast., Disp: 30 tablet, Rfl: 5 .  Ensure (ENSURE), Take 237 mLs by mouth 2 (two) times daily between meals., Disp: , Rfl:  .  ergocalciferol (VITAMIN D2) 1.25 MG (50000 UT) capsule, Take 1 capsule (50,000 Units total) by mouth once a week., Disp: 10 capsule, Rfl: 3 .  folic acid (FOLVITE) 1 MG tablet, TAKE 1 TABLET BY MOUTH EVERY DAY, Disp: 30 tablet, Rfl: 3 .  hydroxychloroquine (PLAQUENIL) 200 MG tablet,  Take 1 tablet (200 mg total) by mouth daily., Disp: 30 tablet, Rfl: 3 .  Menthol, Topical Analgesic, (ICY HOT BACK EX), Apply 1 application topically daily as needed (pain)., Disp: , Rfl:  .  MITIGARE 0.6 MG CAPS, Take 0.6 mg by mouth daily as needed (gout)., Disp: , Rfl:  .  Multiple Vitamin (MULTIVITAMIN) tablet, Take 1 tablet by mouth daily., Disp: , Rfl:  .  nystatin (MYCOSTATIN) 100000 UNIT/ML suspension, Take 5 mLs (500,000 Units total) by mouth 4 (four) times daily for 14 days. Please swish and swallow, Disp: 280 mL, Rfl: 0 .  omeprazole (PRILOSEC) 40 MG capsule, Take 40 mg by mouth daily., Disp: , Rfl:  .  polyethylene glycol (MIRALAX / GLYCOLAX) 17 g packet, Take 17 g by mouth as needed., Disp: , Rfl:  .  predniSONE (DELTASONE) 5 MG tablet, Take 1 tablet (5 mg total) by mouth daily with breakfast., Disp: 30 tablet, Rfl: 3 .  Vericiguat (VERQUVO) 10 MG TABS, Take 10 mg by mouth daily., Disp: 30 tablet, Rfl: 11  Past Medical History: Past Medical History:  Diagnosis Date  . Acid reflux 07/15/2020  . Arthritis 07/15/2020  . CHF (congestive heart failure) ([place])   . Colitis 07/15/2020  . Dilated cardiomyopathy ([place]) 07/15/2020  . HFrEF (heart failure with reduced ejection fraction) ([place]) 07/15/2020  . HTN (hypertension) 07/15/2020  . Osteoporosis 07/15/2020  . Palpitations 07/15/2020  . Rheumatoid arthritis ([place]) 07/15/2020  Tobacco Use: Social History   Tobacco Use  Smoking Status Never Smoker  Smokeless Tobacco Never Used    Labs: Recent Review Scientist, physiological     Labs for ITP Cardiac and Pulmonary Rehab Latest Ref Rng & Units 08/14/2020 08/14/2020   HCO3 20.0 - 28.0 mmol/L 25.5 25.4   TCO2 22 - 32 mmol/L 27 27   O2SAT % 62.0 61.0       Capillary Blood Glucose: No results found for: GLUCAP    Exercise Target Goals: Exercise Program Goal: Individual exercise prescription set using results from initial 6 min walk test and THRR while considering  patient's activity barriers  and safety.   Exercise Prescription Goal: Starting with aerobic activity 30 plus minutes a day, 3 days per week for initial exercise prescription. Provide home exercise prescription and guidelines that participant acknowledges understanding prior to discharge.  Activity Barriers & Risk Stratification:  Activity Barriers & Cardiac Risk Stratification - 12/05/20 1549       Activity Barriers & Cardiac Risk Stratification   Activity Barriers Arthritis;Neck/Spine Problems;Joint Problems;Deconditioning;Muscular Weakness;Decreased Ventricular Function    Cardiac Risk Stratification High             6 Minute Walk:  6 Minute Walk     Row Name 12/05/20 1503         6 Minute Walk   Phase Initial     Distance 688.9 feet  .21 km on Nustep     Walk Time 6 minutes     # of Rest Breaks 0     MPH 1.3     METS 1.55     RPE 11     Perceived Dyspnea  1     VO2 Peak 5.42     Symptoms Yes (comment)     Comments SOB, RPD = 1     Resting HR 80 bpm     Resting BP 96/67     Resting Oxygen Saturation  98 %     Exercise Oxygen Saturation  during 6 min walk 98 %     Max Ex. HR 88 bpm     Max Ex. BP 101/71     2 Minute Post BP 103/73              Oxygen Initial Assessment:    Oxygen Re-Evaluation:    Oxygen Discharge (Final Oxygen Re-Evaluation):    Initial Exercise Prescription:  Initial Exercise Prescription - 12/05/20 1500       Date of Initial Exercise RX and Referring Provider   Date 12/05/20    Referring Provider Loralie Champagne, MD    Expected Discharge Date 01/31/21      NuStep   Level 1    SPM 1    Minutes 50    METs 1.2      Prescription Details   Frequency (times per week) 3    Duration Progress to 30 minutes of continuous aerobic without signs/symptoms of physical distress      Intensity   THRR 40-80% of Max Heartrate 58-116    Ratings of Perceived Exertion 11-13    Perceived Dyspnea 0-4      Progression   Progression Continue progressive overload as  per policy without signs/symptoms or physical distress.      Resistance Training   Training Prescription Yes    Weight 1 lb    Reps 10-15             Perform Capillary Blood Glucose checks as needed.  Exercise Prescription Changes:    Exercise Comments:    Exercise Goals and Review:  Exercise Goals     Row Name 12/05/20 1552             Exercise Goals   Increase Physical Activity Yes       Intervention Provide advice, education, support and counseling about physical activity/exercise needs.;Develop an individualized exercise prescription for aerobic and resistive training based on initial evaluation findings, risk stratification, comorbidities and participant's personal goals.       Expected Outcomes Short Term: Attend rehab on a regular basis to increase amount of physical activity.;Long Term: Add in home exercise to make exercise part of routine and to increase amount of physical activity.;Long Term: Exercising regularly at least 3-5 days a week.       Increase Strength and Stamina Yes       Intervention Provide advice, education, support and counseling about physical activity/exercise needs.;Develop an individualized exercise prescription for aerobic and resistive training based on initial evaluation findings, risk stratification, comorbidities and participant's personal goals.       Expected Outcomes Short Term: Increase workloads from initial exercise prescription for resistance, speed, and METs.;Short Term: Perform resistance training exercises routinely during rehab and add in resistance training at home;Long Term: Improve cardiorespiratory fitness, muscular endurance and strength as measured by increased METs and functional capacity (6MWT)       Able to understand and use rate of perceived exertion (RPE) scale Yes       Intervention Provide education and explanation on how to use RPE scale       Expected Outcomes Short Term: Able to use RPE daily in rehab to express  subjective intensity level;Long Term:  Able to use RPE to guide intensity level when exercising independently       Knowledge and understanding of Target Heart Rate Range (THRR) Yes       Intervention Provide education and explanation of THRR including how the numbers were predicted and where they are located for reference       Expected Outcomes Short Term: Able to state/look up THRR;Short Term: Able to use daily as guideline for intensity in rehab;Long Term: Able to use THRR to govern intensity when exercising independently       Understanding of Exercise Prescription Yes       Intervention Provide education, explanation, and written materials on patient's individual exercise prescription       Expected Outcomes Short Term: Able to explain program exercise prescription;Long Term: Able to explain home exercise prescription to exercise independently                Exercise Goals Re-Evaluation :     Discharge Exercise Prescription (Final Exercise Prescription Changes):    Nutrition:  Target Goals: Understanding of nutrition guidelines, daily intake of sodium <1520m, cholesterol <2052m calories 30% from fat and 7% or less from saturated fats, daily to have 5 or more servings of fruits and vegetables.  Biometrics:  Pre Biometrics - 12/05/20 1415       Pre Biometrics   Waist Circumference 32 inches    Hip Circumference 35.25 inches    Waist to Hip Ratio 0.91 %    Triceps Skinfold 29 mm    % Body Fat 34.8 %    Grip Strength 15 kg    Flexibility --   Not preformed, balance/mobility concerns   Single Leg Stand --   Not preformed, balance/mobility concerns  Nutrition Therapy Plan and Nutrition Goals:    Nutrition Assessments:   MEDIFICTS Score Key:  ?70 Need to make dietary changes   40-70 Heart Healthy Diet  ? 40 Therapeutic Level Cholesterol Diet    Picture Your Plate Scores:  <73 Unhealthy dietary pattern with much room for improvement.  41-50  Dietary pattern unlikely to meet recommendations for good health and room for improvement.  51-60 More healthful dietary pattern, with some room for improvement.   >60 Healthy dietary pattern, although there may be some specific behaviors that could be improved.    Nutrition Goals Re-Evaluation:    Nutrition Goals Discharge (Final Nutrition Goals Re-Evaluation):    Psychosocial: Target Goals: Acknowledge presence or absence of significant depression and/or stress, maximize coping skills, provide positive support system. Participant is able to verbalize types and ability to use techniques and skills needed for reducing stress and depression.  Initial Review & Psychosocial Screening:  Initial Psych Review & Screening - 12/06/20 0839       Initial Review   Current issues with Current Stress Concerns    Source of Stress Concerns Chronic Illness;Unable to participate in former interests or hobbies;Unable to perform yard/household activities      [place]? Yes   Tracy Nelson lives with her daughter and also has a son for support     Barriers   Psychosocial barriers to participate in program The patient should benefit from training in stress management and relaxation.      Screening Interventions   Interventions Encouraged to exercise;To provide support and resources with identified psychosocial needs    Expected Outcomes Long Term Goal: Stressors or current issues are controlled or eliminated.             Quality of Life Scores:  Quality of Life - 12/05/20 1601       Quality of Life   Select Quality of Life      Quality of Life Scores   Health/Function Pre 22.5 %    Socioeconomic Pre 27.1 %    Psych/Spiritual Pre 27.43 %    Family Pre 30 %    GLOBAL Pre 25.63 %            Scores of 19 and below usually indicate a poorer quality of life in these areas.  A difference of  2-3 points is a clinically meaningful difference.  A difference of 2-3  points in the total score of the Quality of Life Index has been associated with significant improvement in overall quality of life, self-image, physical symptoms, and general health in studies assessing change in quality of life.  PHQ-9: Recent Review Flowsheet Data     Depression screen [facility] 2/9 12/06/2020 11/14/2020   Decreased Interest 0 0   Down, Depressed, Hopeless 0 0   PHQ - 2 Score 0 0   Altered sleeping - 0   Tired, decreased energy - 0   Change in appetite - 0   Feeling bad or failure about yourself  - 0   Trouble concentrating - 0   Moving slowly or fidgety/restless - 0   Suicidal thoughts - 0   PHQ-9 Score - 0      Interpretation of Total Score  Total Score Depression Severity:  1-4 = Minimal depression, 5-9 = Mild depression, 10-14 = Moderate depression, 15-19 = Moderately severe depression, 20-27 = Severe depression   Psychosocial Evaluation and Intervention:    Psychosocial Re-Evaluation:    Psychosocial Discharge (  Final Psychosocial Re-Evaluation):    Vocational Rehabilitation: Provide vocational rehab assistance to qualifying candidates.   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 Participant will state understanding/return demonstration of topics presented.  Learning Barriers/Preferences:  Learning Barriers/Preferences - 12/05/20 1600       Learning Barriers/Preferences   Learning Barriers Exercise Concerns   Poor mobility   Learning Preferences Individual Instruction;Pictoral;Skilled Demonstration;Written Material;Video             Education Topics: Hypertension, Hypertension Reduction -Define  heart disease and high blood pressure. Discus how high blood pressure affects the body and ways to reduce high blood pressure.    Exercise and Your Heart -Discuss why it is important to exercise, the FITT principles of exercise, normal and abnormal responses to exercise, and how to exercise safely.    Angina -Discuss definition of angina, causes of angina, treatment of angina, and how to decrease risk of having angina.    Cardiac Medications -Review what the following cardiac medications are used for, how they affect the body, and side effects that may occur when taking the medications.  Medications include Aspirin, Beta blockers, calcium channel blockers, ACE Inhibitors, angiotensin receptor blockers, diuretics, digoxin, and antihyperlipidemics.    Congestive Heart Failure -Discuss the definition of CHF, how to live with CHF, the signs and symptoms of CHF, and how keep track of weight and sodium intake.    Heart Disease and Intimacy -Discus the effect sexual activity has on the heart, how changes occur during intimacy as we age, and safety during sexual activity.    Smoking Cessation / COPD -Discuss different methods to quit smoking, the health benefits of quitting smoking, and the definition of COPD.    Nutrition I: Fats -Discuss the types of cholesterol, what cholesterol does to the heart, and how cholesterol levels can be controlled.    Nutrition II: Labels -Discuss the different components of food labels and how to read food label    Heart Parts/Heart Disease and PAD -Discuss the anatomy of the heart, the pathway of blood circulation through the heart, and these are affected by heart disease.    Stress I: Signs and Symptoms -Discuss the causes of stress, how stress may lead to anxiety and depression, and ways to limit stress.    Stress II: Relaxation -Discuss different types of relaxation techniques to limit stress.    Warning Signs of Stroke / TIA -Discuss  definition of a stroke, what the signs and symptoms are of a stroke, and how to identify when someone is having stroke.    Knowledge Questionnaire Score:  Knowledge Questionnaire Score - 12/05/20 1653       Knowledge Questionnaire Score   Pre Score 17/24             Core Components/Risk Factors/Patient Goals at Admission:  Personal Goals and Risk Factors at Admission - 12/05/20 1600       Core Components/Risk Factors/Patient Goals on Admission    Weight Management Yes;Weight Gain    Intervention Weight Management: Develop a combined nutrition and exercise program designed to reach desired caloric intake, while maintaining appropriate  intake of nutrient and fiber, sodium and fats, and appropriate energy expenditure required for the weight goal.;Weight Management: Provide education and appropriate resources to help participant work on and attain dietary goals.    Expected Outcomes Weight Gain: Understanding of general recommendations for a high calorie, high protein meal plan that promotes weight gain by distributing calorie intake throughout the day with the consumption for 4-5 meals, snacks, and/or supplements;Weight Maintenance: Understanding of the daily nutrition guidelines, which includes 25-35% calories from fat, 7% or less cal from saturated fats, less than 216m cholesterol, less than 1.5gm of sodium, & 5 or more servings of fruits and vegetables daily;Long Term: Adherence to nutrition and physical activity/exercise program aimed toward attainment of established weight goal;Short Term: Continue to assess and modify interventions until short term weight is achieved    Heart Failure Yes    Intervention Provide a combined exercise and nutrition program that is supplemented with education, support and counseling about heart failure. Directed toward relieving symptoms such as shortness of breath, decreased exercise tolerance, and extremity edema.    Expected Outcomes Short term:  Attendance in program 2-3 days a week with increased exercise capacity. Reported lower sodium intake. Reported increased fruit and vegetable intake. Reports medication compliance.;Short term: Daily weights obtained and reported for increase. Utilizing diuretic protocols set by physician.;Long term: Adoption of self-care skills and reduction of barriers for early signs and symptoms recognition and intervention leading to self-care maintenance.    Hypertension Yes    Intervention Provide education on lifestyle modifcations including regular physical activity/exercise, weight management, moderate sodium restriction and increased consumption of fresh fruit, vegetables, and low fat dairy, alcohol moderation, and smoking cessation.;Monitor prescription use compliance.    Expected Outcomes Short Term: Continued assessment and intervention until BP is < 140/983mHG in hypertensive participants. < 130/8016mG in hypertensive participants with diabetes, heart failure or chronic kidney disease.;Long Term: Maintenance of blood pressure at goal levels.    Personal Goal Other Yes    Personal Goal Pt wants to walk more, increase heart strength and have more energy    Intervention Will continue to monitor pt and will progress workloads as tolerated without sign or symptom    Expected Outcomes Pt will be able to increase her exercise an walking sessions             Core Components/Risk Factors/Patient Goals Review:     Core Components/Risk Factors/Patient Goals at Discharge (Final Review):     ITP Comments:  ITP Comments     Row Name 12/05/20 1504           ITP Comments Dr TraFransico Him, Medical Director                Comments: [facility] orientation on 12/06/2020 to review rules and guidelines for program.  Completed 6 minute nustep  test, Intitial ITP, and exercise prescription. Tracy Nelson report having  mild shortness of breath and took no rest breaks her met level was 1.5  VSS.  Telemetry-Sinus Rhythm, Bundle Branch Block, PVC's. Tracy Nelson frail and deconditioned, although she is mentally sharp . Safety measures and social distancing in place per CDC guidelines. Systolic BP ranged from 96-42-876ing the automatic small BP cuff/machine. Advised the patient to attend twice a week due to her fitness level, fragility instead of 3 times a week. Patient aggreable to the plan. Also the patient wore slip on shoes. A walk test was not completed as patient says she does not walk  for for long periods of time. Instructed the patient to wear preferable a sneaker.Tracy Pall, RN,BSN 12/06/2020 9:39 AM

## 2020-12-09 ENCOUNTER — Encounter (HOSPITAL_COMMUNITY)
Admission: RE | Admit: 2020-12-09 | Discharge: 2020-12-09 | Disposition: A | Discharge status: Home / Self Care | Payer: Medicare PPO | Source: Ambulatory Visit | Attending: Cardiology | Admitting: Cardiology

## 2020-12-09 ENCOUNTER — Other Ambulatory Visit: Payer: Self-pay

## 2020-12-09 DIAGNOSIS — I5022 Chronic systolic (congestive) heart failure: Secondary | ICD-10-CM

## 2020-12-09 NOTE — Progress Notes (Signed)
Daily Session Note  Patient Details  Name: Tracy Nelson MRN: [id number] Date of Birth: 11/27/1945 Referring Provider:   Flowsheet Row CARDIAC REHAB PHASE II ORIENTATION from 12/05/2020 in [facility] CARD[facility]  Referring Provider Dalton McLean, MD      Encounter Date: 12/09/2020  Check In:  Session Check In - 12/09/20 1519      Check-In   Supervising physician immediately available to respond to emergencies [facility] immediately available    Physician(s) Dr Amery    Location [facility]    Staff Present  , RN, BSN;Jetta Walker BS, ACSM EP-C, Exercise Physiologist;David Makemson, MS, ACSM-CEP, CCRP, Exercise Physiologist;Olinty Richards, MS, ACSM CEP, Exercise Physiologist;Jessica Martin, MS, ACSM-CEP, Exercise Physiologist    Virtual Visit No    Medication changes reported     No    Fall or balance concerns reported    No    Tobacco Cessation No Change    Current number of cigarettes/nicotine per day     0    Warm-up and Cool-down Performed on first and last piece of equipment   cardiac rehab orientation   Resistance Training Performed Yes    VAD Patient? No    PAD/SET Patient? No      Pain Assessment   Currently in Pain? No/denies    Pain Score 0-No pain    Multiple Pain Sites No           Capillary Blood Glucose: No results found for this or any previous visit (from the past 24 hour(s)).   Exercise Prescription Changes - 12/09/20 1615      Response to Exercise   Blood Pressure (Admit) 92/68    Blood Pressure (Exercise) 98/60    Blood Pressure (Exit) 96/53    Heart Rate (Admit) 92 bpm    Heart Rate (Exercise) 100 bpm    Heart Rate (Exit) 86 bpm    Rating of Perceived Exertion (Exercise) 11    Symptoms Fatigue    Comments Pt's first day in the CRP2 program    Duration Progress to 30 minutes of  aerobic without signs/symptoms of physical distress    Intensity THRR unchanged      Progression    Progression Continue to progress workloads to maintain intensity without signs/symptoms of physical distress.    Average METs 1      Resistance Training   Training Prescription Yes    Weight 1 lb    Reps 10-15    Time 10 Minutes      Interval Training   Interval Training No      NuStep   Level 1    SPM 49    Minutes 20    METs 1           Social History   Tobacco Use  Smoking Status Never Smoker  Smokeless Tobacco Never Used    Goals Met:  Strength training completed today  Goals Unmet:  BP  Comments: Pt started cardiac rehab today. Lonnie used a rollator for stability  Pt tolerated light exercise without difficulty.vital signs are as follows. Entry BP 92/60 heart rate 92, Blood pressure 96/53 on the nustep. Max heart rate 100 telemetry- Exit BP 96/53 heart rate 68. Sinus Rhythm, Bundle Branch Block.  asymptomatic.  Medication list reconciled. Pt denies barriers to medicaiton compliance.  PSYCHOSOCIAL ASSESSMENT:  PHQ-0. Pt exhibits positive coping skills, hopeful outlook with supportive family. No psychosocial needs identified at this time, no psychosocial interventions necessary.      Pt enjoys spending time with her family and her grandkids.   Pt oriented to exercise equipment and routine.    Understanding verbalized. Dr Claris Gladden office notified about the patient's BP's. Dr Aundra Dubin said that [name] is okay to exercise as long as her systolic BP is greater than 90 and that she is asymptomatic.Will continue to monitor the patient throughout  the program.Ramiro Pangilinan Venetia Maxon, RN,BSN 12/10/2020 8:54 AM    Dr. Fransico Him is Medical Director for Cardiac Rehab at [facility].

## 2020-12-11 ENCOUNTER — Encounter (HOSPITAL_COMMUNITY)
Admission: RE | Admit: 2020-12-11 | Discharge: 2020-12-11 | Disposition: A | Discharge status: Home / Self Care | Payer: Medicare PPO | Source: Ambulatory Visit | Attending: Cardiology | Admitting: Cardiology

## 2020-12-11 ENCOUNTER — Other Ambulatory Visit: Payer: Self-pay

## 2020-12-11 DIAGNOSIS — I5022 Chronic systolic (congestive) heart failure: Secondary | ICD-10-CM

## 2020-12-12 NOTE — Progress Notes (Addendum)
Tracy Nelson 75 y.o. female Nutrition Note  Diagnosis: CHF  Past Medical History:  Diagnosis Date  . Acid reflux 07/15/2020  . Arthritis 07/15/2020  . CHF (congestive heart failure) ([place])   . Colitis 07/15/2020  . Dilated cardiomyopathy ([place]) 07/15/2020  . HFrEF (heart failure with reduced ejection fraction) ([place]) 07/15/2020  . HTN (hypertension) 07/15/2020  . Osteoporosis 07/15/2020  . Palpitations 07/15/2020  . Rheumatoid arthritis ([place]) 07/15/2020     Medications reviewed.   Current Outpatient Medications:  .  acetaminophen (TYLENOL) 500 MG tablet, Take 500-1,000 mg by mouth every 6 (six) hours as needed for moderate pain., Disp: , Rfl:  .  amiodarone (PACERONE) 200 MG tablet, Take 1 tablet (200 mg total) by mouth daily., Disp: 60 tablet, Rfl: 6 .  apixaban (ELIQUIS) 2.5 MG TABS tablet, Take 1 tablet (2.5 mg total) by mouth 2 (two) times daily., Disp: 60 tablet, Rfl: 6 .  azaTHIOprine (IMURAN) 50 MG tablet, Take 1 tablet (50 mg total) by mouth daily., Disp: 30 tablet, Rfl: 3 .  bumetanide (BUMEX) 1 MG tablet, Take 2 mg in AM and 1 mg in PM, Disp: 180 tablet, Rfl: 3 .  cholestyramine (QUESTRAN) 4 g packet, Take 1 packet (4 g total) by mouth daily., Disp: 60 each, Rfl: 12 .  cycloSPORINE (RESTASIS) 0.05 % ophthalmic emulsion, Place 1 drop into both eyes 2 (two) times daily., Disp: 1.5 mL, Rfl: 2 .  dapagliflozin propanediol (FARXIGA) 10 MG TABS tablet, Take 1 tablet (10 mg total) by mouth daily before breakfast., Disp: 30 tablet, Rfl: 5 .  Ensure (ENSURE), Take 237 mLs by mouth 2 (two) times daily between meals., Disp: , Rfl:  .  ergocalciferol (VITAMIN D2) 1.25 MG (50000 UT) capsule, Take 1 capsule (50,000 Units total) by mouth once a week., Disp: 10 capsule, Rfl: 3 .  folic acid (FOLVITE) 1 MG tablet, TAKE 1 TABLET BY MOUTH EVERY DAY, Disp: 30 tablet, Rfl: 3 .  hydroxychloroquine (PLAQUENIL) 200 MG tablet, Take 1 tablet (200 mg total) by mouth daily., Disp: 30 tablet, Rfl: 3 .   Menthol, Topical Analgesic, (ICY HOT BACK EX), Apply 1 application topically daily as needed (pain)., Disp: , Rfl:  .  MITIGARE 0.6 MG CAPS, Take 0.6 mg by mouth daily as needed (gout)., Disp: , Rfl:  .  Multiple Vitamin (MULTIVITAMIN) tablet, Take 1 tablet by mouth daily., Disp: , Rfl:  .  nystatin (MYCOSTATIN) 100000 UNIT/ML suspension, Take 5 mLs (500,000 Units total) by mouth 4 (four) times daily for 14 days. Please swish and swallow, Disp: 280 mL, Rfl: 0 .  omeprazole (PRILOSEC) 40 MG capsule, Take 40 mg by mouth daily., Disp: , Rfl:  .  polyethylene glycol (MIRALAX / GLYCOLAX) 17 g packet, Take 17 g by mouth as needed., Disp: , Rfl:  .  predniSONE (DELTASONE) 5 MG tablet, Take 1 tablet (5 mg total) by mouth daily with breakfast., Disp: 30 tablet, Rfl: 3 .  Vericiguat (VERQUVO) 10 MG TABS, Take 10 mg by mouth daily., Disp: 30 tablet, Rfl: 11   Ht Readings from Last 1 Encounters:  12/05/20 5\' 2"  (1.575 m)     Wt Readings from Last 3 Encounters:  12/05/20 106 lb 7.7 oz (48.3 kg)  12/04/20 108 lb 3.2 oz (49.1 kg)  11/29/20 107 lb (48.5 kg)     There is no height or weight on file to calculate BMI.   Social History   Tobacco Use  Smoking Status Never Smoker  Smokeless Tobacco Never  Used     No results found for: CHOL No results found for: HDL No results found for: LDLCALC No results found for: TRIG   No results found for: HGBA1C   CBG (last 3)  No results for input(s): GLUCAP in the last 72 hours.   Nutrition Note  Spoke with pt. Nutrition Plan and Nutrition Survey goals reviewed with pt.  Pt reports UBW of 135 lbs in October 2021. She has lost about 20 lbs in past 6 months. She has a poor appetite.  She has not had any nutrition counseling. She reports info from MD about low sodium and no processed foods. She has cut out most processed foods and salt. Her family cooks for her. She feels poorly and does not like the taste of her food. Reviewed sodium recommendations of  2000 mg/day and ways to enhance flavor of food and use salt in moderation to help her eat more nutrients.  Diet recall: Wilburn Mylar she ate 1 cup of cooked oatmeal (small amount of brown sugar and cinnamon) and 1/2 cup of bojangles mashed potatoes and drank water. She denies difficulty swallowing. She does daily weights to check for fluid retention.  Pt has Pre-diabetes. Last A1c indicates blood glucose well-controlled.   Pt expressed understanding of the information reviewed.    Nutrition Diagnosis ? Unintentional wt loss related to poor appetite and food not tasting good as evidenced by wt loss of 21% in past 6 months and pt report of poor intake.  Nutrition Intervention ? Pt's individual nutrition plan reviewed with pt. ? High calorie diet; Ensure BID ? Continue client-centered nutrition education by RD, as part of interdisciplinary care.  Goal(s) ? Pt to identify food quantities necessary to achieve weight gain of 0.5 lbs per week without fluid retention  ? Pt to build a healthy plate including vegetables, fruits, whole grains, and low-fat dairy products in a heart healthy meal plan. ? Pt to drink ENSURE BID   Plan:   Will provide client-centered nutrition education as part of interdisciplinary care  Monitor and evaluate progress toward nutrition goal with team.   Michaele Offer, MS, RDN, LDN

## 2020-12-13 ENCOUNTER — Encounter (HOSPITAL_COMMUNITY): Payer: Medicare PPO

## 2020-12-14 ENCOUNTER — Other Ambulatory Visit (HOSPITAL_COMMUNITY): Payer: Self-pay | Admitting: Cardiology

## 2020-12-15 ENCOUNTER — Other Ambulatory Visit (HOSPITAL_COMMUNITY): Payer: Self-pay | Admitting: Cardiology

## 2020-12-16 ENCOUNTER — Encounter (HOSPITAL_COMMUNITY)
Admission: RE | Admit: 2020-12-16 | Discharge: 2020-12-16 | Disposition: A | Discharge status: Home / Self Care | Payer: Medicare PPO | Source: Ambulatory Visit | Attending: Cardiology | Admitting: Cardiology

## 2020-12-16 ENCOUNTER — Other Ambulatory Visit: Payer: Self-pay

## 2020-12-16 DIAGNOSIS — I5022 Chronic systolic (congestive) heart failure: Secondary | ICD-10-CM | POA: Diagnosis not present

## 2020-12-17 ENCOUNTER — Other Ambulatory Visit (HOSPITAL_COMMUNITY): Payer: Self-pay | Admitting: Cardiology

## 2020-12-17 ENCOUNTER — Ambulatory Visit (HOSPITAL_COMMUNITY)
Admission: RE | Admit: 2020-12-17 | Discharge: 2020-12-17 | Disposition: A | Discharge status: Home / Self Care | Payer: Medicare PPO | Source: Ambulatory Visit | Attending: Cardiology | Admitting: Cardiology

## 2020-12-17 ENCOUNTER — Encounter (HOSPITAL_COMMUNITY): Payer: Self-pay | Admitting: Cardiology

## 2020-12-17 VITALS — BP 90/50 | HR 85 | Wt 100.6 lb

## 2020-12-17 DIAGNOSIS — B3781 Candidal esophagitis: Secondary | ICD-10-CM | POA: Diagnosis not present

## 2020-12-17 DIAGNOSIS — I4891 Unspecified atrial fibrillation: Secondary | ICD-10-CM | POA: Diagnosis not present

## 2020-12-17 DIAGNOSIS — R42 Dizziness and giddiness: Secondary | ICD-10-CM | POA: Diagnosis not present

## 2020-12-17 DIAGNOSIS — Z7984 Long term (current) use of oral hypoglycemic drugs: Secondary | ICD-10-CM | POA: Insufficient documentation

## 2020-12-17 DIAGNOSIS — Z79899 Other long term (current) drug therapy: Secondary | ICD-10-CM | POA: Diagnosis not present

## 2020-12-17 DIAGNOSIS — I5020 Unspecified systolic (congestive) heart failure: Secondary | ICD-10-CM | POA: Diagnosis not present

## 2020-12-17 DIAGNOSIS — I5022 Chronic systolic (congestive) heart failure: Secondary | ICD-10-CM | POA: Insufficient documentation

## 2020-12-17 DIAGNOSIS — R5383 Other fatigue: Secondary | ICD-10-CM | POA: Insufficient documentation

## 2020-12-17 DIAGNOSIS — Z7901 Long term (current) use of anticoagulants: Secondary | ICD-10-CM | POA: Insufficient documentation

## 2020-12-17 DIAGNOSIS — M069 Rheumatoid arthritis, unspecified: Secondary | ICD-10-CM | POA: Diagnosis not present

## 2020-12-17 DIAGNOSIS — I428 Other cardiomyopathies: Secondary | ICD-10-CM | POA: Insufficient documentation

## 2020-12-17 DIAGNOSIS — Z8249 Family history of ischemic heart disease and other diseases of the circulatory system: Secondary | ICD-10-CM | POA: Insufficient documentation

## 2020-12-17 DIAGNOSIS — R7989 Other specified abnormal findings of blood chemistry: Secondary | ICD-10-CM | POA: Insufficient documentation

## 2020-12-17 DIAGNOSIS — R0600 Dyspnea, unspecified: Secondary | ICD-10-CM | POA: Insufficient documentation

## 2020-12-17 DIAGNOSIS — I4892 Unspecified atrial flutter: Secondary | ICD-10-CM | POA: Diagnosis not present

## 2020-12-17 DIAGNOSIS — Z7952 Long term (current) use of systemic steroids: Secondary | ICD-10-CM | POA: Diagnosis not present

## 2020-12-17 DIAGNOSIS — N184 Chronic kidney disease, stage 4 (severe): Secondary | ICD-10-CM | POA: Insufficient documentation

## 2020-12-17 LAB — BASIC METABOLIC PANEL
Anion gap: 9 (ref 5–15)
BUN: 46 mg/dL — ABNORMAL HIGH (ref 8–23)
CO2: 26 mmol/L (ref 22–32)
Calcium: 9.5 mg/dL (ref 8.9–10.3)
Chloride: 100 mmol/L (ref 98–111)
Creatinine, Ser: 2.24 mg/dL — ABNORMAL HIGH (ref 0.44–1.00)
GFR, Estimated: 22 mL/min — ABNORMAL LOW (ref >60)
Glucose, Bld: 99 mg/dL (ref 70–99)
Potassium: 3.7 mmol/L (ref 3.5–5.1)
Sodium: 135 mmol/L (ref 135–145)

## 2020-12-17 NOTE — Patient Instructions (Signed)
EKG done today.  Labs done today. We will contact you only if your labs are abnormal.  No medication changes were made. Please continue all current medications as prescribed.  Your physician recommends that you schedule a follow-up appointment in: 1 month with our APP Clinic here in our office.    If you have any questions or concerns before your next appointment please send [place] a message through [place] or call our office at [phone].    TO LEAVE A MESSAGE FOR THE NURSE SELECT OPTION 2, PLEASE LEAVE A MESSAGE INCLUDING: . YOUR NAME . DATE OF BIRTH . CALL BACK NUMBER . REASON FOR CALL**this is important as we prioritize the call backs  YOU WILL RECEIVE A CALL BACK THE SAME DAY AS LONG AS YOU CALL BEFORE 4:00 PM   Do the following things EVERYDAY: 1) Weigh yourself in the morning before breakfast. Write it down and keep it in a log. 2) Take your medicines as prescribed 3) Eat low salt foods--Limit salt (sodium) to 2000 mg per day.  4) Stay as active as you can everyday 5) Limit all fluids for the day to less than 2 liters   At the [facility], you and your health needs are our priority. As part of our continuing mission to provide you with exceptional heart care, we have created designated Provider Care Teams. These Care Teams include your primary Cardiologist (physician) and Advanced Practice Providers (APPs- Physician Assistants and Nurse Practitioners) who all work together to provide you with the care you need, when you need it.   You may see any of the following providers on your designated Care Team at your next follow up: Marland Kitchen Dr Glori Bickers . Dr Loralie Champagne . Darrick Grinder, NP . Lyda Jester, PA . Audry Riles, PharmD   Please be sure to bring in all your medications bottles to every appointment.

## 2020-12-18 ENCOUNTER — Encounter (HOSPITAL_COMMUNITY): Payer: Medicare PPO

## 2020-12-18 ENCOUNTER — Other Ambulatory Visit (HOSPITAL_COMMUNITY): Payer: Self-pay | Admitting: Cardiology

## 2020-12-18 NOTE — Progress Notes (Signed)
Cardiology: Dr. Aundra Dubin  75 y.o. with history of rheumatoid arthritis, CKD stage 3, and nonischemic cardiomyopathy was referred by Dr. Andrey Cota in [place] to establish heart failure care in [place].  Patient has been known to have a cardiomyopathy since 2015.  Cardiac MRI in 2015 showed EF 37% with no LGE.  Coronary angiography at that time showed no significant coronary disease.  Over the next few years, LV EF fluctuated up and down.  In 3/21, echo showed EF down to 20-25%.  She was admitted to the hospital in [place], [place] in 11/21 with CHF exacerbation.  Echo showed EF 20-25%, global hypokinesis, mildly decreased RV systolic function.  Creatinine was noted to be elevated. She was diuresed and sent home, but was readmitted later in 11/21 with ongoing volume overload. Creatinine was up to 2.89.  She was taken off most of her cardiac meds due to soft BP and elevated creatinine. She was discharged to her daughter's house in [place].   I repeated an echo on her in 12/21, EF < 20% with moderate LV dilation and mildly decreased RV systolic function. RHC was done, surprisingly showing low filling pressures and relatively preserved cardiac index at 2.22.   She was admitted in 3/22 with atrial fibrillation, converted to NSR on amiodarone. She went into atrial flutter after this and had TEE-DCCV in 4/22.  TEE showed EF < 20%, moderate LV dilation, mild RV dilation with moderate RV dysfunction, moderate central MR.  CPX in 3/22 showed severe HF limitation.  PYP scan 4/22 was grade 1 with H/CL 1.48, equivocal.    She was evaluated for [facility] or ANTHEM, BNP too high and thought to be too frail.   Patient returns today for followup of CHF. She is in NSR today.  Weight down 7 lbs.  She saw GI for dysphagia and was thought to have esophageal candidiasis, treated with Nystatin.  Still has some dysphagia.  Still very fatigued.  Dyspnea walking longer distances, generally ok walking around the house. Lightheaded  with standing at times, no falls.  No chest pain.  No orthopnea/PND. Now doing cardiac rehab, feels like this is helping.   ECG (personally reviewed): NSR, poor RWP   Labs (12/21): K 5 => 3.7, creatinine 2.89 => 1.9 => 1.69, BNP 2904 Labs (1/22): K 4, creatinine 1.9, hgb 12.5 Labs (3/22): K 3.6, creatinine 2.36, hgb 11.6, TSH normal Labs (4/22): K 3.7, creatinine 2.25, LFTs normal, urine immunofixation normal, pro-BNP 6498  PMH: 1. GERD 2. Rheumatoid arthritis: Followed by rheumatology in [place].  - No known pulmonary disease from RA.  3. Osteoporosis. 4. Gout 5. CKD stage 4 6. H/o SVT 7. Chronic systolic CHF: She was found to have nonischemic cardiomyopathy with low EF in 2015.  - LHC (2015): Normal coronaries.  - cardiac MRI (2015): EF 37%, no LGE - Echo (3/21): EF 20-25%. - Echo (11/21): EF 20-25%, moderate LV dilation, global hypokinesis, biatrial enlargement, mildly decreased RV systolic function.  - Echo (12/21): EF < 20%, moderate LV dilation, normal RV size/systolic function, moderate MR, IVC normal.  - RHC (1/22): Mean RA 1, PA 39/14, mean PCWP 10, PVR 3.9 WU, CI 2.22.  - CPX (3/22): peak VO2 10.1, VE/VCO2 55, RER 1.13.  Severe functional limitation due to HF.  - TEE (4/22): EF < 20%, moderate LV dilation, mild RV dilation with moderate RV dysfunction, moderate central MR. - PYP scan (4/22): grade 1 with H/CL 1.48. Equivocal.  8. Atrial fibrillation/flutter: TEE-DCCV in 4/22.   SH:  From [place] but recently moved to [place] to live with daughter.  Has 3 children.  Nonsmoker.  No ETOH.   FH: Mother with CHF, father with MI.   ROS: All systems reviewed and negative except as per HPI.   Current Outpatient Medications  Medication Sig Dispense Refill  . acetaminophen (TYLENOL) 500 MG tablet Take 500-1,000 mg by mouth every 6 (six) hours as needed for moderate pain.    Marland Kitchen amiodarone (PACERONE) 200 MG tablet Take 1 tablet (200 mg total) by mouth daily. 60 tablet 6  .  apixaban (ELIQUIS) 2.5 MG TABS tablet Take 1 tablet (2.5 mg total) by mouth 2 (two) times daily. 60 tablet 6  . azaTHIOprine (IMURAN) 50 MG tablet TAKE 1 TABLET BY MOUTH EVERY DAY 30 tablet 11  . bumetanide (BUMEX) 1 MG tablet Take 3 tablets (3 mg total) by mouth 2 (two) times daily. TAKE 3 TABLETS (3 MG TOTAL) BY MOUTH 2 (TWO) TIMES DAILY. 540 tablet 3  . cholestyramine (QUESTRAN) 4 g packet Take 1 packet (4 g total) by mouth daily. 60 each 12  . cycloSPORINE (RESTASIS) 0.05 % ophthalmic emulsion Place 1 drop into both eyes 2 (two) times daily. 1.5 mL 2  . dapagliflozin propanediol (FARXIGA) 10 MG TABS tablet Take 1 tablet (10 mg total) by mouth daily before breakfast. 30 tablet 5  . Ensure (ENSURE) Take 237 mLs by mouth 2 (two) times daily between meals.    . ergocalciferol (VITAMIN D2) 1.25 MG (50000 UT) capsule Take 1 capsule (50,000 Units total) by mouth once a week. 10 capsule 3  . folic acid (FOLVITE) 1 MG tablet TAKE 1 TABLET BY MOUTH EVERY DAY 30 tablet 3  . hydroxychloroquine (PLAQUENIL) 200 MG tablet TAKE 1 TABLET BY MOUTH EVERY DAY 30 tablet 11  . Menthol, Topical Analgesic, (ICY HOT BACK EX) Apply 1 application topically daily as needed (pain).    Marland Kitchen MITIGARE 0.6 MG CAPS Take 0.6 mg by mouth daily as needed (gout).    . Multiple Vitamin (MULTIVITAMIN) tablet Take 1 tablet by mouth daily.    Marland Kitchen omeprazole (PRILOSEC) 40 MG capsule Take 40 mg by mouth daily.    . polyethylene glycol (MIRALAX / GLYCOLAX) 17 g packet Take 17 g by mouth as needed.    . predniSONE (DELTASONE) 5 MG tablet Take 1 tablet (5 mg total) by mouth daily with breakfast. 30 tablet 3  . Vericiguat (VERQUVO) 10 MG TABS Take 10 mg by mouth daily. 30 tablet 11   No current facility-administered medications for this encounter.   BP (!) 90/50   Pulse 85   Wt 45.6 kg (100 lb 9.6 oz)   SpO2 98%   BMI 18.40 kg/m  General: NAD, frail Neck: No JVD, no thyromegaly or thyroid nodule.  Lungs: Clear to auscultation  bilaterally with normal respiratory effort. CV: Nondisplaced PMI.  Heart regular S1/S2, no S3/S4, no murmur.  No peripheral edema.  No carotid bruit.  Difficult to palpate pedal pulses.  Abdomen: Soft, nontender, no hepatosplenomegaly, no distention.  Skin: Intact without lesions or rashes.  Neurologic: Alert and oriented x 3.  Psych: Normal affect. Extremities: No clubbing or cyanosis.  HEENT: Normal.   Assessment/Plan: 1. Chronic systolic CHF: Nonischemic cardiomyopathy.  This has been known since 2015, cath in 2015 showed no coronary disease and cardiac MRI in 2015 showed no LGE.  Cause is uncertain, familial cardiomyopathy is a concern given nonischemic cardiomyopathy in her mother.  Cannot rule out remote viral myocarditis. She  remains quite limited, primarily by fatigue, NYHA class IIIb.  Poor appetite and severe HF limitation on CPX in 3/22 is concerning, as is elevated creatinine (suspect cardiorenal syndrome).  Low BP and elevated creatinine have limited her cardiac med regimen. Most recent echo in 12/21 showed EF < 20% with normal RV systolic function. RHC (1/22) showed low filling pressures and actually a relatively preserved cardiac index of 2.22.  She is not volume overloaded on exam today.  - Continue bumetanide 2 mg qam/1 mg qpm. BMET today.  - Wear compression stockings during the day.   - Continue Verquvo 10 mg daily.  - Continue dapagliflozin 10 mg daily.  - Off spironolactone with recent rise in creatinine and soft BP.  - I have mentioned [facility] gene testing for possible familial cardiomyopathy. She will think about this.  - Though RHC was not markedly abnormal, CPX showed severe functional limitation due to HF.  I am worried that she is near end-stage.  She is frail with CKD stage 4, I do not think that she would be a candidate for LVAD.  - With renal failure, CHF, and atrial arrhythmias, we have assessed for cardiac amyloidosis.  PYP scan was equivocal in 4/22, will repeat in  4 months. Urine immunofixation negative, still need myeloma panel.  - Continue cardiac rehab.  - Thought to be too frail with HF too advanced for barostimulation activation therapy or vagal nerve stimulation ([place] and ANTHEM trial).  - Does not have an ICD, would not place given NICM, advanced age, and concern for nearing end stage HF.  2. CKD: Stage 4.   - BMET today.  - She has a nephrology appt.  3. Rheumatoid arthritis: No history of lung involvement. She has been on a low dose of prednisone chronically.  - Has referral to rheumatology.   4. Dysphagia: Treated by GI for esophageal candidiasis.  5. Atrial fibrillation/flutter: S/p TEE-DCCV in 4/22.  She is now on amiodarone and in NSR.  - Continue amiodarone 200 mg daily. LFTs and TSH normal recently.  Will need regular eye exam.  - Continue Eliquis 5 mg bid.   Followup in 1 month with APP   Loralie Champagne 12/18/2020

## 2020-12-20 ENCOUNTER — Encounter (HOSPITAL_COMMUNITY): Payer: Medicare PPO

## 2020-12-23 ENCOUNTER — Telehealth (HOSPITAL_COMMUNITY): Payer: Self-pay

## 2020-12-23 ENCOUNTER — Encounter (HOSPITAL_COMMUNITY): Payer: Medicare PPO

## 2020-12-25 ENCOUNTER — Encounter (HOSPITAL_COMMUNITY)
Admission: RE | Admit: 2020-12-25 | Discharge: 2020-12-25 | Disposition: A | Discharge status: Home / Self Care | Payer: Medicare PPO | Source: Ambulatory Visit | Attending: Cardiology | Admitting: Cardiology

## 2020-12-25 ENCOUNTER — Other Ambulatory Visit: Payer: Self-pay

## 2020-12-25 DIAGNOSIS — I5022 Chronic systolic (congestive) heart failure: Secondary | ICD-10-CM

## 2020-12-26 ENCOUNTER — Other Ambulatory Visit (HOSPITAL_COMMUNITY): Payer: Self-pay

## 2020-12-26 NOTE — Progress Notes (Signed)
Nutrition Note  Spoke with pt. Diet recall from pt:  Breakfast: 1/2 cup dry oats, 4 tsp brown sugar, 1 egg Lunch: none Dinner: Baked chicken 3 oz, cooked spinach in olive oil, 1/2 c uncle ben[employer] spanish rice\ Estimated enery intake: 917 kcals Sodium intake: 755 mg. Pt has lost 7 lbs since January (6.5% weight loss in 4 months). Pt expresses fear of eating too much sodium. Her appetite is poor. She misses eating food that tastes good. She only eats 2 meals per day. Her family is limiting food choices/quanity to reduce sodium in diet. Pt has been instructed by MD that she can eat 2000 mg sodium per day.   Michaele Offer, MS, RDN, LDN

## 2020-12-26 NOTE — Progress Notes (Signed)
Paramedicine Encounter    Patient ID: Deloma Spindle, female    DOB: 1946-06-11, 75 y.o.   MRN: [id number]   Patient Care Team: Pcp, No as PCP - General Bebe Liter Connye Burkitt, LCSW as Social Worker (Licensed [employer] representative)  Patient Active Problem List   Diagnosis Date Noted  . Hypotension 10/22/2020  . Atypical chest pain 10/22/2020  . CKD (chronic kidney disease), stage III ([place]) 10/22/2020  . Nausea & vomiting 10/22/2020  . Physical deconditioning 10/22/2020  . Atrial flutter ([place]) 10/18/2020  . Arthritis 07/15/2020  . GERD (gastroesophageal reflux disease) 07/15/2020  . Colitis 07/15/2020  . Non-ischemic cardiomyopathy ([place]) 07/15/2020  . HFrEF (heart failure with reduced ejection fraction) ([place]) 07/15/2020  . HTN (hypertension) 07/15/2020  . Osteoporosis 07/15/2020  . Palpitations 07/15/2020  . Rheumatoid arthritis ([place]) 07/15/2020    Current Outpatient Medications:  .  acetaminophen (TYLENOL) 500 MG tablet, Take 500-1,000 mg by mouth every 6 (six) hours as needed for moderate pain., Disp: , Rfl:  .  amiodarone (PACERONE) 200 MG tablet, Take 1 tablet (200 mg total) by mouth daily., Disp: 60 tablet, Rfl: 6 .  apixaban (ELIQUIS) 2.5 MG TABS tablet, Take 1 tablet (2.5 mg total) by mouth 2 (two) times daily., Disp: 60 tablet, Rfl: 6 .  azaTHIOprine (IMURAN) 50 MG tablet, TAKE 1 TABLET BY MOUTH EVERY DAY, Disp: 30 tablet, Rfl: 11 .  bumetanide (BUMEX) 1 MG tablet, Take 3 tablets (3 mg total) by mouth 2 (two) times daily. TAKE 3 TABLETS (3 MG TOTAL) BY MOUTH 2 (TWO) TIMES DAILY., Disp: 540 tablet, Rfl: 3 .  cholestyramine (QUESTRAN) 4 g packet, Take 1 packet (4 g total) by mouth daily., Disp: 60 each, Rfl: 12 .  cycloSPORINE (RESTASIS) 0.05 % ophthalmic emulsion, Place 1 drop into both eyes 2 (two) times daily., Disp: 1.5 mL, Rfl: 2 .  dapagliflozin propanediol (FARXIGA) 10 MG TABS tablet, Take 1 tablet (10 mg total) by mouth daily before breakfast., Disp: 30 tablet, Rfl: 5 .   Ensure (ENSURE), Take 237 mLs by mouth 2 (two) times daily between meals., Disp: , Rfl:  .  ergocalciferol (VITAMIN D2) 1.25 MG (50000 UT) capsule, Take 1 capsule (50,000 Units total) by mouth once a week., Disp: 10 capsule, Rfl: 3 .  folic acid (FOLVITE) 1 MG tablet, TAKE 1 TABLET BY MOUTH EVERY DAY, Disp: 30 tablet, Rfl: 3 .  hydroxychloroquine (PLAQUENIL) 200 MG tablet, TAKE 1 TABLET BY MOUTH EVERY DAY, Disp: 30 tablet, Rfl: 11 .  Menthol, Topical Analgesic, (ICY HOT BACK EX), Apply 1 application topically daily as needed (pain)., Disp: , Rfl:  .  MITIGARE 0.6 MG CAPS, Take 0.6 mg by mouth daily as needed (gout)., Disp: , Rfl:  .  Multiple Vitamin (MULTIVITAMIN) tablet, Take 1 tablet by mouth daily., Disp: , Rfl:  .  omeprazole (PRILOSEC) 40 MG capsule, Take 40 mg by mouth daily., Disp: , Rfl:  .  polyethylene glycol (MIRALAX / GLYCOLAX) 17 g packet, Take 17 g by mouth as needed., Disp: , Rfl:  .  predniSONE (DELTASONE) 5 MG tablet, TAKE 1 TABLET BY MOUTH EVERY DAY WITH BREAKFAST, Disp: 30 tablet, Rfl: 3 .  Vericiguat (VERQUVO) 10 MG TABS, Take 10 mg by mouth daily., Disp: 30 tablet, Rfl: 11 Allergies  Allergen Reactions  . Baclofen Nausea And Vomiting  . Penicillins Rash      Social History   Socioeconomic History  . Marital status: Widowed    Spouse name: Not on file  .  Number of children: Not on file  . Years of education: 99  . Highest education level: Bachelor's degree (e.g., BA, AB, BS)  Occupational History  . Occupation: Retired  Tobacco Use  . Smoking status: Never Smoker  . Smokeless tobacco: Never Used  Substance and Sexual Activity  . Alcohol use: Never  . Drug use: Never  . Sexual activity: Not Currently  Other Topics Concern  . Not on file  Social History Narrative  . Not on file   Social Determinants of Health   Financial Resource Strain: Low Risk   . Difficulty of Paying Living Expenses: Not very hard  Food Insecurity: No Food Insecurity  . Worried  About Charity fundraiser in the Last Year: Never true  . Ran Out of Food in the Last Year: Never true  Transportation Needs: No Transportation Needs  . Lack of Transportation (Medical): No  . Lack of Transportation (Non-Medical): No  Physical Activity: Not on file  Stress: Not on file  Social Connections: Not on file  Intimate Partner Violence: Not on file    Physical Exam      Future Appointments  Date Time Provider [place]  12/30/2020  3:15 PM [facility] PHASE II EXC [facility] None  12/31/2020  8:45 AM [name], MD [facility] None  01/01/2021  3:15 PM [facility] PHASE II EXC [facility] None  01/08/2021  3:15 PM [facility] PHASE II EXC [facility] None  01/13/2021  3:15 PM [facility] PHASE II EXC [facility] None  01/15/2021  3:15 PM [facility] PHASE II EXC [facility] None  01/20/2021  3:15 PM [facility] PHASE II EXC [facility] None  01/22/2021  3:15 PM [facility] PHASE II EXC [facility] None  01/27/2021  3:15 PM [facility] PHASE II EXC [facility] None  01/28/2021 10:30 AM [facility] PA/NP [facility] None  01/29/2021  3:15 PM [facility] PHASE II EXC [facility] None  02/13/2021  9:00 AM Ghumman, Ramandeep, NP [facility] None    BP 90/60   Pulse 78   Resp 18   Wt 105 lb (47.6 kg)   SpO2 98%   BMI 19.20 kg/m   Weight yesterday-? Last visit weight-100 @ clinic   Pt reports she is doing ok. She reports poor appetite and not sleeping good and feels that is getting worse.  She is going cardiac rehab twice a week.  She reports her balance is not getting any better.  She is afraid to take anything for it b/c she is afraid she wont wake up.  No change in meds.  She is managing her own meds. Using [employer] [facility].  She reports having fluttering every once in a while.  She reports her breathing is doing ok.  She still has a wound on her leg, has been seen by dermatologist. Will call her in a couple wks to f/u. She would like to stay in program for now to be seen about once a month and do EKG to ensure her heart  is staying in rhythm.    Marylouise Stacks, [facility] Paramedic  12/26/20

## 2020-12-27 ENCOUNTER — Encounter (HOSPITAL_COMMUNITY): Payer: Medicare PPO

## 2020-12-30 ENCOUNTER — Other Ambulatory Visit: Payer: Self-pay

## 2020-12-30 ENCOUNTER — Encounter (HOSPITAL_COMMUNITY)
Admission: RE | Admit: 2020-12-30 | Discharge: 2020-12-30 | Disposition: A | Discharge status: Home / Self Care | Payer: Medicare PPO | Source: Ambulatory Visit | Attending: Cardiology | Admitting: Cardiology

## 2020-12-30 DIAGNOSIS — I5022 Chronic systolic (congestive) heart failure: Secondary | ICD-10-CM

## 2020-12-31 ENCOUNTER — Ambulatory Visit: Payer: Medicare PPO | Admitting: Internal Medicine

## 2020-12-31 NOTE — Progress Notes (Signed)
Cardiac Individual Treatment Plan  Patient Details  Name: Tracy Nelson MRN: [id number] Date of Birth: June 07, 1946 Referring Provider:   Flowsheet Row CARDIAC REHAB PHASE II ORIENTATION from 12/05/2020 in [place]  Referring Provider Loralie Champagne, MD      Initial Encounter Date:  [place] PHASE II ORIENTATION from 12/05/2020 in [place]  Date 12/05/20      Visit Diagnosis: Chronic systolic congestive heart failure ([place])  Patient's Home Medications on Admission:  Current Outpatient Medications:  .  acetaminophen (TYLENOL) 500 MG tablet, Take 500-1,000 mg by mouth every 6 (six) hours as needed for moderate pain., Disp: , Rfl:  .  amiodarone (PACERONE) 200 MG tablet, Take 1 tablet (200 mg total) by mouth daily., Disp: 60 tablet, Rfl: 6 .  apixaban (ELIQUIS) 2.5 MG TABS tablet, Take 1 tablet (2.5 mg total) by mouth 2 (two) times daily., Disp: 60 tablet, Rfl: 6 .  azaTHIOprine (IMURAN) 50 MG tablet, TAKE 1 TABLET BY MOUTH EVERY DAY, Disp: 30 tablet, Rfl: 11 .  bumetanide (BUMEX) 1 MG tablet, Take 3 tablets (3 mg total) by mouth 2 (two) times daily. TAKE 3 TABLETS (3 MG TOTAL) BY MOUTH 2 (TWO) TIMES DAILY., Disp: 540 tablet, Rfl: 3 .  cholestyramine (QUESTRAN) 4 g packet, Take 1 packet (4 g total) by mouth daily., Disp: 60 each, Rfl: 12 .  cycloSPORINE (RESTASIS) 0.05 % ophthalmic emulsion, Place 1 drop into both eyes 2 (two) times daily., Disp: 1.5 mL, Rfl: 2 .  dapagliflozin propanediol (FARXIGA) 10 MG TABS tablet, Take 1 tablet (10 mg total) by mouth daily before breakfast., Disp: 30 tablet, Rfl: 5 .  Ensure (ENSURE), Take 237 mLs by mouth 2 (two) times daily between meals., Disp: , Rfl:  .  ergocalciferol (VITAMIN D2) 1.25 MG (50000 UT) capsule, Take 1 capsule (50,000 Units total) by mouth once a week., Disp: 10 capsule, Rfl: 3 .  folic acid (FOLVITE) 1 MG tablet, TAKE 1 TABLET BY MOUTH EVERY DAY, Disp:  30 tablet, Rfl: 3 .  hydroxychloroquine (PLAQUENIL) 200 MG tablet, TAKE 1 TABLET BY MOUTH EVERY DAY, Disp: 30 tablet, Rfl: 11 .  Menthol, Topical Analgesic, (ICY HOT BACK EX), Apply 1 application topically daily as needed (pain)., Disp: , Rfl:  .  MITIGARE 0.6 MG CAPS, Take 0.6 mg by mouth daily as needed (gout)., Disp: , Rfl:  .  Multiple Vitamin (MULTIVITAMIN) tablet, Take 1 tablet by mouth daily., Disp: , Rfl:  .  omeprazole (PRILOSEC) 40 MG capsule, Take 40 mg by mouth daily., Disp: , Rfl:  .  polyethylene glycol (MIRALAX / GLYCOLAX) 17 g packet, Take 17 g by mouth as needed., Disp: , Rfl:  .  predniSONE (DELTASONE) 5 MG tablet, TAKE 1 TABLET BY MOUTH EVERY DAY WITH BREAKFAST, Disp: 30 tablet, Rfl: 3 .  Vericiguat (VERQUVO) 10 MG TABS, Take 10 mg by mouth daily., Disp: 30 tablet, Rfl: 11  Past Medical History: Past Medical History:  Diagnosis Date  . Acid reflux 07/15/2020  . Arthritis 07/15/2020  . CHF (congestive heart failure) ([place])   . Colitis 07/15/2020  . Dilated cardiomyopathy ([place]) 07/15/2020  . HFrEF (heart failure with reduced ejection fraction) ([place]) 07/15/2020  . HTN (hypertension) 07/15/2020  . Osteoporosis 07/15/2020  . Palpitations 07/15/2020  . Rheumatoid arthritis ([place]) 07/15/2020    Tobacco Use: Social History   Tobacco Use  Smoking Status Never Smoker  Smokeless Tobacco Never Used    Labs:  Recent Review Flowsheet Data    Labs for ITP Cardiac and Pulmonary Rehab Latest Ref Rng & Units 08/14/2020 08/14/2020   HCO3 20.0 - 28.0 mmol/L 25.5 25.4   TCO2 22 - 32 mmol/L 27 27   O2SAT % 62.0 61.0      Capillary Blood Glucose: No results found for: GLUCAP   Exercise Target Goals: Exercise Program Goal: Individual exercise prescription set using results from initial 6 min walk test and THRR while considering  patient's activity barriers and safety.   Exercise Prescription Goal: Starting with aerobic activity 30 plus minutes a day, 3 days per week for initial exercise  prescription. Provide home exercise prescription and guidelines that participant acknowledges understanding prior to discharge.  Activity Barriers & Risk Stratification:  Activity Barriers & Cardiac Risk Stratification - 12/05/20 1549      Activity Barriers & Cardiac Risk Stratification   Activity Barriers Arthritis;Neck/Spine Problems;Joint Problems;Deconditioning;Muscular Weakness;Decreased Ventricular Function    Cardiac Risk Stratification High           6 Minute Walk:  6 Minute Walk    Row Name 12/05/20 1503         6 Minute Walk   Phase Initial     Distance 688.9 feet  .21 km on [place]     Walk Time 6 minutes     # of Rest Breaks 0     MPH 1.3     METS 1.55     RPE 11     Perceived Dyspnea  1     VO2 Peak 5.42     Symptoms Yes (comment)     Comments SOB, RPD = 1     Resting HR 80 bpm     Resting BP 96/67     Resting Oxygen Saturation  98 %     Exercise Oxygen Saturation  during 6 min walk 98 %     Max Ex. HR 88 bpm     Max Ex. BP 101/71     2 Minute Post BP 103/73            Oxygen Initial Assessment:   Oxygen Re-Evaluation:   Oxygen Discharge (Final Oxygen Re-Evaluation):   Initial Exercise Prescription:  Initial Exercise Prescription - 12/05/20 1500      Date of Initial Exercise RX and Referring Provider   Date 12/05/20    Referring Provider Loralie Champagne, MD    Expected Discharge Date 01/31/21      NuStep   Level 1    SPM 1    Minutes 50    METs 1.2      Prescription Details   Frequency (times per week) 3    Duration Progress to 30 minutes of continuous aerobic without signs/symptoms of physical distress      Intensity   THRR 40-80% of Max Heartrate 58-116    Ratings of Perceived Exertion 11-13    Perceived Dyspnea 0-4      Progression   Progression Continue progressive overload as per policy without signs/symptoms or physical distress.      Resistance Training   Training Prescription Yes    Weight 1 lb    Reps 10-15            Perform Capillary Blood Glucose checks as needed.  Exercise Prescription Changes:  Exercise Prescription Changes    Row Name 12/09/20 1615 12/25/20 1630           Response to Exercise   Blood Pressure (Admit) 92/68 96/60  Blood Pressure (Exercise) 98/60 106/70      Blood Pressure (Exit) 96/53 93/62      Heart Rate (Admit) 92 bpm 88 bpm      Heart Rate (Exercise) 100 bpm 88 bpm      Heart Rate (Exit) 86 bpm 83 bpm      Rating of Perceived Exertion (Exercise) 11 11      Symptoms Fatigue Fatigue      Comments Pt's first day in the [facility] program Reviewed METs      Duration Progress to 30 minutes of  aerobic without signs/symptoms of physical distress Progress to 30 minutes of  aerobic without signs/symptoms of physical distress      Intensity THRR unchanged THRR unchanged             Progression   Progression Continue to progress workloads to maintain intensity without signs/symptoms of physical distress. Continue to progress workloads to maintain intensity without signs/symptoms of physical distress.      Average METs 1 1             Resistance Training   Training Prescription Yes No      Weight 1 lb No weight on Wednesdays      Reps 10-15 --      Time 10 Minutes --             Interval Training   Interval Training No No             NuStep   Level 1 1      SPM 49 53      Minutes 20 25      METs 1 1             Exercise Comments:  Exercise Comments    Row Name 12/09/20 1615 12/10/20 0803 12/25/20 1630       Exercise Comments Pt's first day in the [place] program. Pt c/o fatigue and voiced no other symptoms. Pt was able to tolerate 25 minutes total of exercise on the nustep. Pt wll require slow progression. -- Reviewed METs. Pt is extremely deconditioned and can only tolerate low level activity. Pt achieves an average of 1 MET and is fatigued at the end of the session. She has raised her avergare steps per minute from 49 to 53. A goal will be to get her SPM into  the 60's. Pt has no complaint of symptoms other than fatigue with the exercise. WIll continue to monitor patient and progress workloads as tolerated.            Exercise Goals and Review:  Exercise Goals    Row Name 12/05/20 1552             Exercise Goals   Increase Physical Activity Yes       Intervention Provide advice, education, support and counseling about physical activity/exercise needs.;Develop an individualized exercise prescription for aerobic and resistive training based on initial evaluation findings, risk stratification, comorbidities and participant's personal goals.       Expected Outcomes Short Term: Attend rehab on a regular basis to increase amount of physical activity.;Long Term: Add in home exercise to make exercise part of routine and to increase amount of physical activity.;Long Term: Exercising regularly at least 3-5 days a week.       Increase Strength and Stamina Yes       Intervention Provide advice, education, support and counseling about physical activity/exercise needs.;Develop an individualized exercise prescription for aerobic and resistive training  based on initial evaluation findings, risk stratification, comorbidities and participant's personal goals.       Expected Outcomes Short Term: Increase workloads from initial exercise prescription for resistance, speed, and METs.;Short Term: Perform resistance training exercises routinely during rehab and add in resistance training at home;Long Term: Improve cardiorespiratory fitness, muscular endurance and strength as measured by increased METs and functional capacity (6MWT)       Able to understand and use rate of perceived exertion (RPE) scale Yes       Intervention Provide education and explanation on how to use RPE scale       Expected Outcomes Short Term: Able to use RPE daily in rehab to express subjective intensity level;Long Term:  Able to use RPE to guide intensity level when exercising independently        Knowledge and understanding of Target Heart Rate Range (THRR) Yes       Intervention Provide education and explanation of THRR including how the numbers were predicted and where they are located for reference       Expected Outcomes Short Term: Able to state/look up THRR;Short Term: Able to use daily as guideline for intensity in rehab;Long Term: Able to use THRR to govern intensity when exercising independently       Understanding of Exercise Prescription Yes       Intervention Provide education, explanation, and written materials on patient's individual exercise prescription       Expected Outcomes Short Term: Able to explain program exercise prescription;Long Term: Able to explain home exercise prescription to exercise independently              Exercise Goals Re-Evaluation :  Exercise Goals Re-Evaluation    [place] Name 12/09/20 1615 12/10/20 0800           Exercise Goal Re-Evaluation   Exercise Goals Review Increase Physical Activity;Increase Strength and Stamina;Able to understand and use rate of perceived exertion (RPE) scale;Knowledge and understanding of Target Heart Rate Range (THRR);Understanding of Exercise Prescription --      Comments Pt's first day in the [facility] program. Pt understands exercise Rx, RPE scale, and THRR. --      Expected Outcomes Will continue to monitor and progress patient as tolerated. --              Discharge Exercise Prescription (Final Exercise Prescription Changes):  Exercise Prescription Changes - 12/25/20 1630      Response to Exercise   Blood Pressure (Admit) 96/60    Blood Pressure (Exercise) 106/70    Blood Pressure (Exit) 93/62    Heart Rate (Admit) 88 bpm    Heart Rate (Exercise) 88 bpm    Heart Rate (Exit) 83 bpm    Rating of Perceived Exertion (Exercise) 11    Symptoms Fatigue    Comments Reviewed METs    Duration Progress to 30 minutes of  aerobic without signs/symptoms of physical distress    Intensity THRR unchanged       Progression   Progression Continue to progress workloads to maintain intensity without signs/symptoms of physical distress.    Average METs 1      Resistance Training   Training Prescription No    Weight No weight on Wednesdays      Interval Training   Interval Training No      NuStep   Level 1    SPM 53    Minutes 25    METs 1  Nutrition:  Target Goals: Understanding of nutrition guidelines, daily intake of sodium '1500mg'$ , cholesterol '200mg'$ , calories 30% from fat and 7% or less from saturated fats, daily to have 5 or more servings of fruits and vegetables.  Biometrics:  Pre Biometrics - 12/05/20 1415      Pre Biometrics   Waist Circumference 32 inches    Hip Circumference 35.25 inches    Waist to Hip Ratio 0.91 %    Triceps Skinfold 29 mm    % Body Fat 34.8 %    Grip Strength 15 kg    Flexibility --   Not preformed, balance/mobility concerns   Single Leg Stand --   Not preformed, balance/mobility concerns           Nutrition Therapy Plan and Nutrition Goals:  Nutrition Therapy & Goals - 12/12/20 0917      Nutrition Therapy   Diet High Calorie diet; Sodium 2000 mg      Personal Nutrition Goals   Nutrition Goal Pt to identify food quantities necessary to achieve weight gain of 0.5 lbs per week without fluid retention    Personal Goal #2 Pt to build a healthy plate including vegetables, fruits, whole grains, and low-fat dairy products in a heart healthy meal plan.    Personal Goal #3 Pt to drink ENSURE BID      Intervention Plan   Intervention Prescribe, educate and counsel regarding individualized specific dietary modifications aiming towards targeted core components such as weight, hypertension, lipid management, diabetes, heart failure and other comorbidities.;Nutrition handout(s) given to patient.    Expected Outcomes Short Term Goal: A plan has been developed with personal nutrition goals set during dietitian appointment.;Long Term Goal: Adherence  to prescribed nutrition plan.           Nutrition Assessments:  MEDIFICTS Score Key:  ?70 Need to make dietary changes   40-70 Heart Healthy Diet  ? 40 Therapeutic Level Cholesterol Diet   Picture Your Plate Scores:  <49 Unhealthy dietary pattern with much room for improvement.  41-50 Dietary pattern unlikely to meet recommendations for good health and room for improvement.  51-60 More healthful dietary pattern, with some room for improvement.   >60 Healthy dietary pattern, although there may be some specific behaviors that could be improved.    Nutrition Goals Re-Evaluation:  Nutrition Goals Re-Evaluation    [place] Name 12/12/20 [phone] 12/31/20 0758           Goals   Current Weight 106 lb (48.1 kg) 107 lb 5.8 oz (48.7 kg)      Nutrition Goal Pt to identify food quantities necessary to achieve weight gain of 0.5 lbs per week without fluid retention Pt to identify food quantities necessary to achieve weight gain of 0.5 lbs per week without fluid retention             Personal Goal #2 Re-Evaluation   Personal Goal #2 Pt to build a healthy plate including vegetables, fruits, whole grains, and low-fat dairy products in a heart healthy meal plan. Pt to build a healthy plate including vegetables, fruits, whole grains, and low-fat dairy products in a heart healthy meal plan.             Personal Goal #3 Re-Evaluation   Personal Goal #3 Pt to drink ENSURE BID Pt to drink ENSURE BID             Nutrition Goals Discharge (Final Nutrition Goals Re-Evaluation):  Nutrition Goals Re-Evaluation - 12/31/20 1638  Goals   Current Weight 107 lb 5.8 oz (48.7 kg)    Nutrition Goal Pt to identify food quantities necessary to achieve weight gain of 0.5 lbs per week without fluid retention      Personal Goal #2 Re-Evaluation   Personal Goal #2 Pt to build a healthy plate including vegetables, fruits, whole grains, and low-fat dairy products in a heart healthy meal plan.       Personal Goal #3 Re-Evaluation   Personal Goal #3 Pt to drink ENSURE BID           Psychosocial: Target Goals: Acknowledge presence or absence of significant depression and/or stress, maximize coping skills, provide positive support system. Participant is able to verbalize types and ability to use techniques and skills needed for reducing stress and depression.  Initial Review & Psychosocial Screening:  Initial Psych Review & Screening - 12/06/20 0839      Initial Review   Current issues with Current Stress Concerns    Source of Stress Concerns Chronic Illness;Unable to participate in former interests or hobbies;Unable to perform yard/household activities      [place]? Yes   Ming lives with her daughter and also has a son for support     Barriers   Psychosocial barriers to participate in program The patient should benefit from training in stress management and relaxation.      Screening Interventions   Interventions Encouraged to exercise;To provide support and resources with identified psychosocial needs    Expected Outcomes Long Term Goal: Stressors or current issues are controlled or eliminated.           Quality of Life Scores:  Quality of Life - 12/05/20 1601      Quality of Life   Select Quality of Life      Quality of Life Scores   Health/Function Pre 22.5 %    Socioeconomic Pre 27.1 %    Psych/Spiritual Pre 27.43 %    Family Pre 30 %    GLOBAL Pre 25.63 %          Scores of 19 and below usually indicate a poorer quality of life in these areas.  A difference of  2-3 points is a clinically meaningful difference.  A difference of 2-3 points in the total score of the Quality of Life Index has been associated with significant improvement in overall quality of life, self-image, physical symptoms, and general health in studies assessing change in quality of life.  PHQ-9: Recent Review Flowsheet Data    Depression screen [facility] 2/9  12/06/2020 11/14/2020   Decreased Interest 0 0   Down, Depressed, Hopeless 0 0   PHQ - 2 Score 0 0   Altered sleeping - 0   Tired, decreased energy - 0   Change in appetite - 0   Feeling bad or failure about yourself  - 0   Trouble concentrating - 0   Moving slowly or fidgety/restless - 0   Suicidal thoughts - 0   PHQ-9 Score - 0     Interpretation of Total Score  Total Score Depression Severity:  1-4 = Minimal depression, 5-9 = Mild depression, 10-14 = Moderate depression, 15-19 = Moderately severe depression, 20-27 = Severe depression   Psychosocial Evaluation and Intervention:   Psychosocial Re-Evaluation:  Psychosocial Re-Evaluation    [place] Name 12/31/20 1641             Psychosocial Re-Evaluation   Current issues with Current Stress  Concerns       Comments Lyvonne has not voiced any increased concerns or stressors since participating in phase 2 cardiac rehab       Expected Outcomes Atiya will have controlled stress/ stressors at the completion of phase 2 cardiac rehab if possible       Interventions Encouraged to attend Cardiac Rehabilitation for the exercise;Stress management education       Comments Will continue to offer support as needed               Initial Review   Source of Stress Concerns Chronic Illness;Unable to perform yard/household activities;Retirement/disability;Unable to participate in former interests or hobbies;Transportation              Psychosocial Discharge (Final Psychosocial Re-Evaluation):  Psychosocial Re-Evaluation - 12/31/20 1641      Psychosocial Re-Evaluation   Current issues with Current Stress Concerns    Comments Summerlynn has not voiced any increased concerns or stressors since participating in phase 2 cardiac rehab    Expected Outcomes Ayriana will have controlled stress/ stressors at the completion of phase 2 cardiac rehab if possible    Interventions Encouraged to attend Cardiac Rehabilitation for the exercise;Stress management  education    Comments Will continue to offer support as needed      Initial Review   Source of Stress Concerns Chronic Illness;Unable to perform yard/household activities;Retirement/disability;Unable to participate in former interests or hobbies;Transportation           Vocational Rehabilitation: Provide vocational rehab assistance to qualifying candidates.   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 Participant will state understanding/return demonstration of topics presented.  Learning Barriers/Preferences:  Learning Barriers/Preferences - 12/05/20 1600      Learning Barriers/Preferences   Learning Barriers Exercise Concerns   Poor mobility   Learning Preferences Individual Instruction;Pictoral;Skilled Demonstration;Written Material;Video           Education Topics: Hypertension, Hypertension Reduction -Define heart disease and high blood pressure. Discus how high blood pressure affects the body and ways to reduce high blood pressure.   Exercise and Your Heart -Discuss why it is important to exercise, the FITT principles of exercise, normal and abnormal responses to exercise, and how to exercise safely.   Angina -Discuss definition of angina, causes of angina, treatment of angina, and how to decrease risk of having angina.   Cardiac Medications -Review what the following cardiac medications are used for, how they affect the body, and side effects that may occur when taking the medications.  Medications include Aspirin, Beta blockers, calcium channel blockers, ACE Inhibitors, angiotensin receptor blockers, diuretics, digoxin,  and antihyperlipidemics.   Congestive Heart Failure -Discuss the definition of CHF, how to live with CHF, the signs and symptoms of CHF, and how keep track of weight and sodium intake.   Heart Disease and Intimacy -Discus the effect sexual activity has on the heart, how changes occur during intimacy as we age, and safety during sexual activity.   Smoking Cessation / COPD -Discuss different methods to quit smoking, the health benefits of quitting smoking, and the definition of COPD.   Nutrition I: Fats -  Discuss the types of cholesterol, what cholesterol does to the heart, and how cholesterol levels can be controlled.   Nutrition II: Labels -Discuss the different components of food labels and how to read food label   Heart Parts/Heart Disease and PAD -Discuss the anatomy of the heart, the pathway of blood circulation through the heart, and these are affected by heart disease.   Stress I: Signs and Symptoms -Discuss the causes of stress, how stress may lead to anxiety and depression, and ways to limit stress.   Stress II: Relaxation -Discuss different types of relaxation techniques to limit stress.   Warning Signs of Stroke / TIA -Discuss definition of a stroke, what the signs and symptoms are of a stroke, and how to identify when someone is having stroke.   Knowledge Questionnaire Score:  Knowledge Questionnaire Score - 12/05/20 1653      Knowledge Questionnaire Score   Pre Score 17/24           Core Components/Risk Factors/Patient Goals at Admission:  Personal Goals and Risk Factors at Admission - 12/05/20 1600      Core Components/Risk Factors/Patient Goals on Admission    Weight Management Yes;Weight Gain    Intervention Weight Management: Develop a combined nutrition and exercise program designed to reach desired caloric intake, while maintaining appropriate intake of nutrient and fiber, sodium and fats, and appropriate energy expenditure required for the  weight goal.;Weight Management: Provide education and appropriate resources to help participant work on and attain dietary goals.    Expected Outcomes Weight Gain: Understanding of general recommendations for a high calorie, high protein meal plan that promotes weight gain by distributing calorie intake throughout the day with the consumption for 4-5 meals, snacks, and/or supplements;Weight Maintenance: Understanding of the daily nutrition guidelines, which includes 25-35% calories from fat, 7% or less cal from saturated fats, less than $RemoveB'200mg'VsNmnCDd$  cholesterol, less than 1.5gm of sodium, & 5 or more servings of fruits and vegetables daily;Long Term: Adherence to nutrition and physical activity/exercise program aimed toward attainment of established weight goal;Short Term: Continue to assess and modify interventions until short term weight is achieved    Heart Failure Yes    Intervention Provide a combined exercise and nutrition program that is supplemented with education, support and counseling about heart failure. Directed toward relieving symptoms such as shortness of breath, decreased exercise tolerance, and extremity edema.    Expected Outcomes Short term: Attendance in program 2-3 days a week with increased exercise capacity. Reported lower sodium intake. Reported increased fruit and vegetable intake. Reports medication compliance.;Short term: Daily weights obtained and reported for increase. Utilizing diuretic protocols set by physician.;Long term: Adoption of self-care skills and reduction of barriers for early signs and symptoms recognition and intervention leading to self-care maintenance.    Hypertension Yes    Intervention Provide education on lifestyle modifcations including regular physical activity/exercise, weight management, moderate sodium restriction and increased consumption of fresh fruit, vegetables, and low fat dairy, alcohol moderation, and smoking cessation.;Monitor prescription use  compliance.    Expected Outcomes Short Term: Continued assessment and intervention until BP is < 140/63mm HG in hypertensive participants. < 130/2mm HG in hypertensive participants with diabetes, heart failure or chronic kidney disease.;Long Term: Maintenance of blood pressure at goal levels.    Personal Goal Other Yes    Personal Goal Pt wants to walk more, increase heart strength and have more energy    Intervention Will continue to monitor pt and will progress workloads as tolerated without sign  or symptom    Expected Outcomes Pt will be able to increase her exercise an walking sessions           Core Components/Risk Factors/Patient Goals Review:   Goals and Risk Factor Review    Row Name 12/31/20 1645             Core Components/Risk Factors/Patient Goals Review   Personal Goals Review Weight Management/Obesity;Heart Failure;Stress       Review Camber's systolic BP's remain in the 90's. Patient is asymptomatic. Dr Aundra Dubin is aware. Ruweyda is doing fair with exercise for her fitness level. The patient remains deconditioned but reports feeling a little stronger since participating in phase 2 cardiac rehab.       Expected Outcomes Laurence will continue to participate in phase 2 cardiac rehab for risk factor modification.              Core Components/Risk Factors/Patient Goals at Discharge (Final Review):   Goals and Risk Factor Review - 12/31/20 1645      Core Components/Risk Factors/Patient Goals Review   Personal Goals Review Weight Management/Obesity;Heart Failure;Stress    Review Tiffany's systolic BP's remain in the 90's. Patient is asymptomatic. Dr Aundra Dubin is aware. Taylah is doing fair with exercise for her fitness level. The patient remains deconditioned but reports feeling a little stronger since participating in phase 2 cardiac rehab.    Expected Outcomes Rameen will continue to participate in phase 2 cardiac rehab for risk factor modification.           ITP  Comments:  ITP Comments    Row Name 12/05/20 1504 12/31/20 1639         ITP Comments Dr Fransico Him MD, Medical Director 30 Day ITP Review. Scott has good participation for her fitness level when in attendance at cardiac rehab. There have been some transportation issues with the ride service.             Comments: See ITP comments.Barnet Pall, RN,BSN 12/31/2020 4:50 PM

## 2021-01-01 ENCOUNTER — Other Ambulatory Visit: Payer: Self-pay

## 2021-01-01 ENCOUNTER — Encounter (HOSPITAL_COMMUNITY)
Admission: RE | Admit: 2021-01-01 | Discharge: 2021-01-01 | Disposition: A | Discharge status: Home / Self Care | Payer: Medicare PPO | Source: Ambulatory Visit | Attending: Cardiology | Admitting: Cardiology

## 2021-01-01 DIAGNOSIS — I5022 Chronic systolic (congestive) heart failure: Secondary | ICD-10-CM

## 2021-01-03 ENCOUNTER — Encounter (HOSPITAL_COMMUNITY): Payer: Medicare PPO

## 2021-01-08 ENCOUNTER — Other Ambulatory Visit: Payer: Self-pay

## 2021-01-08 ENCOUNTER — Encounter (HOSPITAL_COMMUNITY)
Admission: RE | Admit: 2021-01-08 | Discharge: 2021-01-08 | Disposition: A | Discharge status: Home / Self Care | Payer: Medicare PPO | Source: Ambulatory Visit | Attending: Cardiology | Admitting: Cardiology

## 2021-01-08 DIAGNOSIS — I5022 Chronic systolic (congestive) heart failure: Secondary | ICD-10-CM | POA: Insufficient documentation

## 2021-01-08 NOTE — Progress Notes (Signed)
Office Visit Note  Patient: Tracy Nelson             Date of Birth: 1946/06/13           MRN: [id number]             PCP: Pcp, No Referring: Tracy Castilla, NP Visit Date: 01/09/2021 Occupation: Retired Pharmacist, hospital  Subjective:  New Patient (Initial Visit) (Right hand stiffness, bil shoulder pain, patient has not COVID vaccines)   History of Present Illness: Tracy Nelson is a 75 y.o. female here for seronegative rheumatoid arthritis previously a patient with Dr. Francee Nelson in [place], [place]. She currently taken hydroxychloroquine 200 mg PO daily and prednisone 5 mg PO daily and has not experienced a severe flare or increase in steroid dosing since about a year ago.  She also experiences sicca symptoms currently treated with Restasis.  She was previously taking Imuran but is not clear if she is still taking this and how much is contributory to disease control. 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  She has also previously undergone some fibromyalgia tender point injection with significant benefit of back pain symptoms. 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 She has upcoming nephrology appointment next week for her CKD and due to frequent hard sticks prefers if lab draws may be consolidated with frequent bruising and discomfort associated..   Activities of Daily Living:  Patient reports morning stiffness for 20 minutes.   Patient Reports nocturnal pain.  Difficulty dressing/grooming: Reports Difficulty climbing stairs: Reports Difficulty getting out of chair: Reports Difficulty using hands for taps, buttons, cutlery, and/or writing: Reports  Review of Systems  Constitutional: Positive for fatigue.  HENT: Positive for mouth dryness.   Eyes: Positive for dryness.  Respiratory: Positive for shortness of breath.    Cardiovascular: Negative for swelling in legs/feet.  Gastrointestinal: Negative for constipation.  Endocrine: Positive for excessive thirst.  Genitourinary: Negative for difficulty urinating.  Musculoskeletal: Positive for arthralgias, gait problem, joint pain, joint swelling and morning stiffness.  Skin: Negative for rash.  Allergic/Immunologic: Negative for susceptible to infections.  Neurological: Positive for weakness.  Hematological: Negative for bruising/bleeding tendency.  Psychiatric/Behavioral: Positive for sleep disturbance.    PMFS History:  Patient Active Problem List   Diagnosis Date Noted  . Hypotension 10/22/2020  . Atypical chest pain 10/22/2020  . CKD (chronic kidney disease), stage III ([place]) 10/22/2020  . Nausea & vomiting 10/22/2020  . Physical deconditioning 10/22/2020  . Atrial flutter ([place]) 10/18/2020  . Arthritis 07/15/2020  . GERD (gastroesophageal reflux disease) 07/15/2020  . Colitis 07/15/2020  . Non-ischemic cardiomyopathy ([place]) 07/15/2020  . HFrEF (heart failure with reduced ejection fraction) ([place]) 07/15/2020  . HTN (hypertension) 07/15/2020  . Osteoporosis 07/15/2020  . Palpitations 07/15/2020  . Acute gout of left shoulder 07/10/2020  . Rheumatoid arthritis involving multiple sites with positive rheumatoid factor ([place]) 10/28/2015  . Mitral regurgitation 05/30/2014  . Acute on chronic combined systolic (congestive) and diastolic (congestive) heart failure ([place]) 05/19/2014  . Vitamin D deficiency 05/15/2013  . Encounter for long-term (current) use of other medications 12/14/2008  . Sjogren's syndrome ([place]) 12/14/2008    Past Medical History:  Diagnosis Date  . Acid reflux 07/15/2020  . Arthritis 07/15/2020  . CHF (congestive heart failure) ([place])   . Colitis 07/15/2020  . Dilated cardiomyopathy ([place]) 07/15/2020  . HFrEF (heart failure with reduced ejection fraction) ([place]) 07/15/2020  .  HTN (hypertension) 07/15/2020  . Osteoporosis 07/15/2020  .  Palpitations 07/15/2020  . Rheumatoid arthritis ([place]) 07/15/2020    Family History  Problem Relation Age of Onset  . Hypertension Mother   . Diabetes Mother   . Hypertension Father   . Diabetes Father   . Cancer Brother   . Arthritis Maternal Grandmother   . Lung disease Paternal Grandfather    Past Surgical History:  Procedure Laterality Date  . CARDIOVERSION N/A 11/08/2020   Procedure: CARDIOVERSION;  Surgeon: [name], MD;  Location: [facility] ENDOSCOPY;  Service: Cardiovascular;  Laterality: N/A;  . RIGHT HEART CATH N/A 08/14/2020   Procedure: RIGHT HEART CATH;  Surgeon: [name], MD;  Location: [facility] CV LAB;  Service: Cardiovascular;  Laterality: N/A;  . TEE WITHOUT CARDIOVERSION N/A 11/08/2020   Procedure: TRANSESOPHAGEAL ECHOCARDIOGRAM (TEE);  Surgeon: [name], MD;  Location: [facility] ENDOSCOPY;  Service: Cardiovascular;  Laterality: N/A;   Social History   Social History Narrative  . Not on file    There is no immunization history on file for this patient.   Objective: Vital Signs: BP (!) 86/47 (BP Location: Right Arm, Patient Position: Sitting, Cuff Size: Normal)   Pulse 82   Resp 15   Ht 5\' 2"  (1.575 m)   Wt 107 lb (48.5 kg)   BMI 19.57 kg/m    Physical Exam HENT:     Right Ear: External ear normal.     Left Ear: External ear normal.     Mouth/Throat:     Mouth: Mucous membranes are moist.     Pharynx: Oropharynx is clear.  Eyes:     Conjunctiva/sclera: Conjunctivae normal.  Cardiovascular:     Rate and Rhythm: Normal rate and regular rhythm.  Pulmonary:     Effort: Pulmonary effort is normal.     Breath sounds: Normal breath sounds.  Skin:    General: Skin is warm and dry.     Findings: No rash.  Neurological:     General: No focal deficit present.     Mental Status: She is alert.  Psychiatric:        Mood and Affect: Mood normal.      Musculoskeletal Exam:  Neck full ROM no tenderness Shoulders full ROM left shoulder pain with  abduction, external rotation, tenderness to pressure posteriorly and laterally to pressure and posture seems held in low position and some posterior muscle atrophy Elbows full ROM no tenderness or swelling Wrists full ROM tenderness with extension, no swelling or warmth Fingers decreased extension ROM, MCP joint bony enlargement and ulnar deviation no palpable synovitis currently Knees full ROM no tenderness or swelling Ankles full ROM no tenderness or swelling MTPs tenderness with squeeze pressure on left side   CDAI Exam: CDAI Score: 11  Patient Global: 50 mm; Provider Global: 30 mm Swollen: 0 ; Tender: 3  Joint Exam 01/09/2021      Right  Left  Glenohumeral      Tender  Wrist   Tender   Tender     Investigation: No additional findings.  Imaging: No results found.  Recent Labs: Lab Results  Component Value Date   WBC 7.8 11/05/2020   HGB 11.6 (L) 11/05/2020   PLT 291 11/05/2020   NA 135 12/17/2020   K 3.7 12/17/2020   CL 100 12/17/2020   CO2 26 12/17/2020   GLUCOSE 99 12/17/2020   BUN 46 (H) 12/17/2020   CREATININE 2.24 (H) 12/17/2020   BILITOT 0.8  11/12/2020   ALKPHOS 64 11/12/2020   AST 30 11/12/2020   ALT 23 11/12/2020   PROT 7.1 11/12/2020   ALBUMIN 3.7 11/12/2020   CALCIUM 9.5 12/17/2020    Speciality Comments: No specialty comments available.  Procedures:  No procedures performed Allergies: Baclofen and Penicillins   Assessment / Plan:     Visit Diagnoses: Rheumatoid arthritis involving multiple sites with positive rheumatoid factor (HCC)  Longstanding seropositive rheumatoid arthritis currently on hydroxychloroquine 200 mg p.o. daily and prednisone 5 mg p.o. daily.  It seems she was previously being prescribed Imuran but does not seem to be currently taking the medication.  Evidence of longstanding disease with mildly deforming changes significantly ulnar deviation.  She describes current joint pain and tenderness particularly in bilateral wrists and  left shoulder though no obvious synovitis is present on physical exam today.  We will consider updating baseline labs for medication monitoring though not currently on high risk regiment. She has upcoming appointment next week with [facility] anticipates lab work for monitoring at that time so agreed reasonable to wait and will request copy of report afterwards to limit repeat phlebotomy.  Sjogren's syndrome, with unspecified organ involvement ([place])  Currently using Restasis for dry eye symptoms no other documented complications or extraglandular disease involvement.  Stage 3 chronic kidney disease, unspecified whether stage 3a or 3b CKD ([place])  She has upcoming nephrology appointment next week with [facility] with plan to follow-up there going forwards.  Previously has been fairly stable based on review of prior records.  Vitamin D deficiency Osteoporosis, unspecified osteoporosis type, unspecified pathological fracture presence  She has previously taken Boniva for osteoporosis did not confirm whether currently taking this medicine or just vitamin D supplementation.  Currently on very high dose replacement.    Orders: No orders of the defined types were placed in this encounter.  No orders of the defined types were placed in this encounter.     Follow-Up Instructions: Return in about 2 months (around 03/11/2021) for New pt RA f/u 8 wks.   [name], MD  Note - This record has been created using [employer].  Chart creation errors have been sought, but may not always  have been located. Such creation errors do not reflect on  the standard of medical care.

## 2021-01-09 ENCOUNTER — Encounter: Payer: Self-pay | Admitting: Internal Medicine

## 2021-01-09 ENCOUNTER — Ambulatory Visit (INDEPENDENT_AMBULATORY_CARE_PROVIDER_SITE_OTHER): Payer: Medicare PPO | Admitting: Internal Medicine

## 2021-01-09 VITALS — BP 86/47 | HR 82 | Resp 15 | Ht 62.0 in | Wt 107.0 lb

## 2021-01-09 DIAGNOSIS — M0579 Rheumatoid arthritis with rheumatoid factor of multiple sites without organ or systems involvement: Secondary | ICD-10-CM

## 2021-01-09 DIAGNOSIS — M810 Age-related osteoporosis without current pathological fracture: Secondary | ICD-10-CM

## 2021-01-09 DIAGNOSIS — N183 Chronic kidney disease, stage 3 unspecified: Secondary | ICD-10-CM

## 2021-01-09 DIAGNOSIS — M3500 Sicca syndrome, unspecified: Secondary | ICD-10-CM

## 2021-01-09 DIAGNOSIS — E559 Vitamin D deficiency, unspecified: Secondary | ICD-10-CM

## 2021-01-10 ENCOUNTER — Encounter (HOSPITAL_COMMUNITY): Payer: Medicare PPO

## 2021-01-13 ENCOUNTER — Encounter (HOSPITAL_COMMUNITY): Payer: Medicare PPO

## 2021-01-13 ENCOUNTER — Telehealth (HOSPITAL_COMMUNITY): Payer: Self-pay

## 2021-01-15 ENCOUNTER — Other Ambulatory Visit: Payer: Self-pay

## 2021-01-15 ENCOUNTER — Encounter (HOSPITAL_COMMUNITY)
Admission: RE | Admit: 2021-01-15 | Discharge: 2021-01-15 | Disposition: A | Discharge status: Home / Self Care | Payer: Medicare PPO | Source: Ambulatory Visit | Attending: Cardiology | Admitting: Cardiology

## 2021-01-15 DIAGNOSIS — I5022 Chronic systolic (congestive) heart failure: Secondary | ICD-10-CM

## 2021-01-17 ENCOUNTER — Encounter (HOSPITAL_COMMUNITY): Payer: Medicare PPO

## 2021-01-20 ENCOUNTER — Encounter (HOSPITAL_COMMUNITY): Payer: Medicare PPO

## 2021-01-22 ENCOUNTER — Encounter (HOSPITAL_COMMUNITY): Payer: Self-pay | Admitting: Adult Health

## 2021-01-22 ENCOUNTER — Encounter (HOSPITAL_COMMUNITY): Payer: Medicare PPO

## 2021-01-22 NOTE — Progress Notes (Signed)
Team Based Care Meeting  HF MD- NA  HF NP - [place] NP-C   [place]   Medications concerns? No   Transportation issues ? No   Education needs? No   SDOH - No   Has follow up in HF clinic 02/12/21 with possible d/c from HF Paramedicine   Hammad Finkler NP-C   4:13 PM

## 2021-01-24 ENCOUNTER — Encounter (HOSPITAL_COMMUNITY): Payer: Medicare PPO

## 2021-01-27 ENCOUNTER — Encounter (HOSPITAL_COMMUNITY): Payer: Medicare PPO

## 2021-01-28 ENCOUNTER — Encounter (HOSPITAL_COMMUNITY): Payer: Medicare PPO

## 2021-01-29 ENCOUNTER — Telehealth (HOSPITAL_COMMUNITY): Payer: Self-pay | Admitting: *Deleted

## 2021-01-29 ENCOUNTER — Encounter (HOSPITAL_COMMUNITY): Payer: Self-pay | Admitting: *Deleted

## 2021-01-29 ENCOUNTER — Encounter (HOSPITAL_COMMUNITY): Payer: Medicare PPO

## 2021-01-29 DIAGNOSIS — I5022 Chronic systolic (congestive) heart failure: Secondary | ICD-10-CM

## 2021-01-29 NOTE — Telephone Encounter (Signed)
Spoke with Mrs Tracy Nelson she is out of town at her home in [place] visiting a sick family member. Markiesha asked if her exercise sessions one more week. Will schedule two more exercise sessions. Barnet Pall, RN,BSN 01/29/2021 4:14 PM

## 2021-01-29 NOTE — Progress Notes (Signed)
Cardiac Individual Treatment Plan  Patient Details  Name: Tracy Nelson MRN: [id number] Date of Birth: 11/06/1945 Referring Provider:   Flowsheet Row CARDIAC REHAB PHASE II ORIENTATION from 12/05/2020 in [facility]  Referring Provider Loralie Champagne, MD       Initial Encounter Date:  [facility] from 12/05/2020 in [place]  Date 12/05/20       Visit Diagnosis: Chronic systolic congestive heart failure ([place])  Patient's Home Medications on Admission:  Current Outpatient Medications:    acetaminophen (TYLENOL) 500 MG tablet, Take 500-1,000 mg by mouth every 6 (six) hours as needed for moderate pain., Disp: , Rfl:    amiodarone (PACERONE) 200 MG tablet, Take 1 tablet (200 mg total) by mouth daily., Disp: 60 tablet, Rfl: 6   apixaban (ELIQUIS) 2.5 MG TABS tablet, Take 1 tablet (2.5 mg total) by mouth 2 (two) times daily., Disp: 60 tablet, Rfl: 6   bumetanide (BUMEX) 1 MG tablet, Take 3 tablets (3 mg total) by mouth 2 (two) times daily. TAKE 3 TABLETS (3 MG TOTAL) BY MOUTH 2 (TWO) TIMES DAILY., Disp: 540 tablet, Rfl: 3   cholestyramine (QUESTRAN) 4 g packet, Take 1 packet (4 g total) by mouth daily., Disp: 60 each, Rfl: 12   cycloSPORINE (RESTASIS) 0.05 % ophthalmic emulsion, Place 1 drop into both eyes 2 (two) times daily., Disp: 1.5 mL, Rfl: 2   dapagliflozin propanediol (FARXIGA) 10 MG TABS tablet, Take 1 tablet (10 mg total) by mouth daily before breakfast., Disp: 30 tablet, Rfl: 5   Ensure (ENSURE), Take 237 mLs by mouth 2 (two) times daily between meals., Disp: , Rfl:    ergocalciferol (VITAMIN D2) 1.25 MG (50000 UT) capsule, Take 1 capsule (50,000 Units total) by mouth once a week., Disp: 10 capsule, Rfl: 3   folic acid (FOLVITE) 1 MG tablet, TAKE 1 TABLET BY MOUTH EVERY DAY, Disp: 30 tablet, Rfl: 3   hydroxychloroquine (PLAQUENIL) 200 MG tablet, TAKE 1 TABLET BY MOUTH EVERY DAY,  Disp: 30 tablet, Rfl: 11   Menthol, Topical Analgesic, (ICY HOT BACK EX), Apply 1 application topically daily as needed (pain)., Disp: , Rfl:    MITIGARE 0.6 MG CAPS, Take 0.6 mg by mouth daily as needed (gout)., Disp: , Rfl:    Multiple Vitamin (MULTIVITAMIN) tablet, Take 1 tablet by mouth daily., Disp: , Rfl:    omeprazole (PRILOSEC) 20 MG capsule, Take 1 capsule by mouth daily., Disp: , Rfl:    omeprazole (PRILOSEC) 40 MG capsule, Take 40 mg by mouth daily., Disp: , Rfl:    polyethylene glycol (MIRALAX / GLYCOLAX) 17 g packet, Take 17 g by mouth as needed., Disp: , Rfl:    predniSONE (DELTASONE) 5 MG tablet, TAKE 1 TABLET BY MOUTH EVERY DAY WITH BREAKFAST, Disp: 30 tablet, Rfl: 3   Vericiguat (VERQUVO) 10 MG TABS, Take 10 mg by mouth daily., Disp: 30 tablet, Rfl: 11  Past Medical History: Past Medical History:  Diagnosis Date   Acid reflux 07/15/2020   Arthritis 07/15/2020   CHF (congestive heart failure) ([place])    Colitis 07/15/2020   Dilated cardiomyopathy ([place]) 07/15/2020   HFrEF (heart failure with reduced ejection fraction) ([place]) 07/15/2020   HTN (hypertension) 07/15/2020   Osteoporosis 07/15/2020   Palpitations 07/15/2020   Rheumatoid arthritis ([place]) 07/15/2020    Tobacco Use: Social History   Tobacco Use  Smoking Status Never  Smokeless Tobacco Never    Labs: Recent Review  Flowsheet Data     Labs for ITP Cardiac and Pulmonary Rehab Latest Ref Rng & Units 08/14/2020 08/14/2020   HCO3 20.0 - 28.0 mmol/L 25.5 25.4   TCO2 22 - 32 mmol/L 27 27   O2SAT % 62.0 61.0       Capillary Blood Glucose: No results found for: GLUCAP   Exercise Target Goals: Exercise Program Goal: Individual exercise prescription set using results from initial 6 min walk test and THRR while considering  patient's activity barriers and safety.   Exercise Prescription Goal: Starting with aerobic activity 30 plus minutes a day, 3 days per week for initial exercise prescription. Provide home exercise  prescription and guidelines that participant acknowledges understanding prior to discharge.  Activity Barriers & Risk Stratification:  Activity Barriers & Cardiac Risk Stratification - 12/05/20 1549       Activity Barriers & Cardiac Risk Stratification   Activity Barriers Arthritis;Neck/Spine Problems;Joint Problems;Deconditioning;Muscular Weakness;Decreased Ventricular Function    Cardiac Risk Stratification High             6 Minute Walk:  6 Minute Walk     Row Name 12/05/20 1503         6 Minute Walk   Phase Initial     Distance 688.9 feet  .21 km on Nustep     Walk Time 6 minutes     # of Rest Breaks 0     MPH 1.3     METS 1.55     RPE 11     Perceived Dyspnea  1     VO2 Peak 5.42     Symptoms Yes (comment)     Comments SOB, RPD = 1     Resting HR 80 bpm     Resting BP 96/67     Resting Oxygen Saturation  98 %     Exercise Oxygen Saturation  during 6 min walk 98 %     Max Ex. HR 88 bpm     Max Ex. BP 101/71     2 Minute Post BP 103/73              Oxygen Initial Assessment:   Oxygen Re-Evaluation:   Oxygen Discharge (Final Oxygen Re-Evaluation):   Initial Exercise Prescription:  Initial Exercise Prescription - 12/05/20 1500       Date of Initial Exercise RX and Referring Provider   Date 12/05/20    Referring Provider Loralie Champagne, MD    Expected Discharge Date 01/31/21      NuStep   Level 1    SPM 1    Minutes 50    METs 1.2      Prescription Details   Frequency (times per week) 3    Duration Progress to 30 minutes of continuous aerobic without signs/symptoms of physical distress      Intensity   THRR 40-80% of Max Heartrate 58-116    Ratings of Perceived Exertion 11-13    Perceived Dyspnea 0-4      Progression   Progression Continue progressive overload as per policy without signs/symptoms or physical distress.      Resistance Training   Training Prescription Yes    Weight 1 lb    Reps 10-15             Perform  Capillary Blood Glucose checks as needed.  Exercise Prescription Changes:   Exercise Prescription Changes     Row Name 12/09/20 1615 12/25/20 1630 01/08/21 1615  Response to Exercise   Blood Pressure (Admit) _0     Blood Pressure (Exercise) 98/60 106/70 102/70     Blood Pressure (Exit) _1     Heart Rate (Admit) 92 bpm 88 bpm 75 bpm     Heart Rate (Exercise) 100 bpm 88 bpm 94 bpm     Heart Rate (Exit) 86 bpm 83 bpm 82 bpm     Rating of Perceived Exertion (Exercise) _2 Symptoms Fatigue Fatigue Fatigue     Comments Pt's first day in the CRP2 program Reviewed METs Reviewed METs/Goals     Duration Progress to 30 minutes of  aerobic without signs/symptoms of physical distress Progress to 30 minutes of  aerobic without signs/symptoms of physical distress Continue with 30 min of aerobic exercise without signs/symptoms of physical distress.     Intensity THRR unchanged THRR unchanged THRR unchanged           Progression       Progression Continue to progress workloads to maintain intensity without signs/symptoms of physical distress. Continue to progress workloads to maintain intensity without signs/symptoms of physical distress. Continue to progress workloads to maintain intensity without signs/symptoms of physical distress.     Average METs 1 1 1.2           Resistance Training       Training Prescription Yes No No     Weight 1 lb No weight on Wednesdays No weight on Wednesdays     Reps 10-15 -- --     Time 10 Minutes -- --           Interval Training       Interval Training No No No           NuStep       Level _3 SPM 49 53 59     Minutes _4 METs 1 1 1.2             Exercise Comments:   Exercise Comments     Row Name 12/09/20 1615 12/10/20 0803 12/25/20 1630 01/08/21 1615     Exercise Comments Pt's first day in the CRP2 program. Pt c/o fatigue and voiced no other symptoms. Pt was able to tolerate 25  minutes total of exercise on the nustep. Pt wll require slow progression. -- Reviewed METs. Pt is extremely deconditioned and can only tolerate low level activity. Pt achieves an average of 1 MET and is fatigued at the end of the session. She has raised her avergare steps per minute from 49 to 53. A goal will be to get her SPM into the 60's. Pt has no complaint of symptoms other than fatigue with the exercise. WIll continue to monitor patient and progress workloads as tolerated. Reviewed METs and Goals. Pt is still working at a very low level but is slowly improving on the Nustep. Pt achieved a MET level of 1.2 with an average of 59 SPM for 30 minutes. Discussed with patient that she is almost ot the goal of 60 + SPM. The home exercise Rx was not discussed as in conversation with patient she voiced that it takes her up to 2 hours to get dressed. Therefore, she was encouraged to be as activie as possible in her home by possibly making an extra trip to the beddroom or kitchen during her day. Her home exercise should focus on being  as active as she feels she can be and resting when she is tired.             Exercise Goals and Review:   Exercise Goals     Row Name 12/05/20 1552             Exercise Goals   Increase Physical Activity Yes       Intervention Provide advice, education, support and counseling about physical activity/exercise needs.;Develop an individualized exercise prescription for aerobic and resistive training based on initial evaluation findings, risk stratification, comorbidities and participant's personal goals.       Expected Outcomes Short Term: Attend rehab on a regular basis to increase amount of physical activity.;Long Term: Add in home exercise to make exercise part of routine and to increase amount of physical activity.;Long Term: Exercising regularly at least 3-5 days a week.       Increase Strength and Stamina Yes       Intervention Provide advice, education, support and  counseling about physical activity/exercise needs.;Develop an individualized exercise prescription for aerobic and resistive training based on initial evaluation findings, risk stratification, comorbidities and participant's personal goals.       Expected Outcomes Short Term: Increase workloads from initial exercise prescription for resistance, speed, and METs.;Short Term: Perform resistance training exercises routinely during rehab and add in resistance training at home;Long Term: Improve cardiorespiratory fitness, muscular endurance and strength as measured by increased METs and functional capacity (6MWT)       Able to understand and use rate of perceived exertion (RPE) scale Yes       Intervention Provide education and explanation on how to use RPE scale       Expected Outcomes Short Term: Able to use RPE daily in rehab to express subjective intensity level;Long Term:  Able to use RPE to guide intensity level when exercising independently       Knowledge and understanding of Target Heart Rate Range (THRR) Yes       Intervention Provide education and explanation of THRR including how the numbers were predicted and where they are located for reference       Expected Outcomes Short Term: Able to state/look up THRR;Short Term: Able to use daily as guideline for intensity in rehab;Long Term: Able to use THRR to govern intensity when exercising independently       Understanding of Exercise Prescription Yes       Intervention Provide education, explanation, and written materials on patient's individual exercise prescription       Expected Outcomes Short Term: Able to explain program exercise prescription;Long Term: Able to explain home exercise prescription to exercise independently                Exercise Goals Re-Evaluation :  Exercise Goals Re-Evaluation     Row Name 12/09/20 1615 12/10/20 0800 01/08/21 1615         Exercise Goal Re-Evaluation   Exercise Goals Review Increase Physical  Activity;Increase Strength and Stamina;Able to understand and use rate of perceived exertion (RPE) scale;Knowledge and understanding of Target Heart Rate Range (THRR);Understanding of Exercise Prescription -- Increase Physical Activity;Increase Strength and Stamina;Able to understand and use rate of perceived exertion (RPE) scale;Understanding of Exercise Prescription;Knowledge and understanding of Target Heart Rate Range (THRR)     Comments Pt's first day in the CRP2 program. Pt understands exercise Rx, RPE scale, and THRR. -- Reviewed METs and Goals.     Expected Outcomes Will continue to monitor and progress  patient as tolerated. -- Will continue to monitor and progress patient as tolerated.               Discharge Exercise Prescription (Final Exercise Prescription Changes):  Exercise Prescription Changes - 01/08/21 1615       Response to Exercise   Blood Pressure (Admit) 96/60    Blood Pressure (Exercise) 102/70    Blood Pressure (Exit) 96/66    Heart Rate (Admit) 75 bpm    Heart Rate (Exercise) 94 bpm    Heart Rate (Exit) 82 bpm    Rating of Perceived Exertion (Exercise) 11    Symptoms Fatigue    Comments Reviewed METs/Goals    Duration Continue with 30 min of aerobic exercise without signs/symptoms of physical distress.    Intensity THRR unchanged      Progression   Progression Continue to progress workloads to maintain intensity without signs/symptoms of physical distress.    Average METs 1.2      Resistance Training   Training Prescription No    Weight No weight on Wednesdays      Interval Training   Interval Training No      NuStep   Level 1    SPM 59    Minutes 30    METs 1.2             Nutrition:  Target Goals: Understanding of nutrition guidelines, daily intake of sodium <1515m, cholesterol <2013m calories 30% from fat and 7% or less from saturated fats, daily to have 5 or more servings of fruits and vegetables.  Biometrics:  Pre Biometrics -  12/05/20 1415       Pre Biometrics   Waist Circumference 32 inches    Hip Circumference 35.25 inches    Waist to Hip Ratio 0.91 %    Triceps Skinfold 29 mm    % Body Fat 34.8 %    Grip Strength 15 kg    Flexibility --   Not preformed, balance/mobility concerns   Single Leg Stand --   Not preformed, balance/mobility concerns             Nutrition Therapy Plan and Nutrition Goals:  Nutrition Therapy & Goals - 12/12/20 0917       Nutrition Therapy   Diet High Calorie diet; Sodium 2000 mg      Personal Nutrition Goals   Nutrition Goal Pt to identify food quantities necessary to achieve weight gain of 0.5 lbs per week without fluid retention    Personal Goal #2 Pt to build a healthy plate including vegetables, fruits, whole grains, and low-fat dairy products in a heart healthy meal plan.    Personal Goal #3 Pt to drink ENSURE BID      Intervention Plan   Intervention Prescribe, educate and counsel regarding individualized specific dietary modifications aiming towards targeted core components such as weight, hypertension, lipid management, diabetes, heart failure and other comorbidities.;Nutrition handout(s) given to patient.    Expected Outcomes Short Term Goal: A plan has been developed with personal nutrition goals set during dietitian appointment.;Long Term Goal: Adherence to prescribed nutrition plan.             Nutrition Assessments:  MEDIFICTS Score Key: ?70 Need to make dietary changes  40-70 Heart Healthy Diet ? 40 Therapeutic Level Cholesterol Diet   Picture Your Plate Scores: <4<55nhealthy dietary pattern with much room for improvement. 41-50 Dietary pattern unlikely to meet recommendations for good health and room for improvement. 51-60 More healthful dietary  pattern, with some room for improvement.  >60 Healthy dietary pattern, although there may be some specific behaviors that could be improved.    Nutrition Goals Re-Evaluation:  Nutrition Goals  Re-Evaluation     [place] Name 12/12/20 [phone] 12/31/20 0758           Goals   Current Weight 106 lb (48.1 kg) 107 lb 5.8 oz (48.7 kg)      Nutrition Goal Pt to identify food quantities necessary to achieve weight gain of 0.5 lbs per week without fluid retention Pt to identify food quantities necessary to achieve weight gain of 0.5 lbs per week without fluid retention             Personal Goal #2 Re-Evaluation      Personal Goal #2 Pt to build a healthy plate including vegetables, fruits, whole grains, and low-fat dairy products in a heart healthy meal plan. Pt to build a healthy plate including vegetables, fruits, whole grains, and low-fat dairy products in a heart healthy meal plan.             Personal Goal #3 Re-Evaluation      Personal Goal #3 Pt to drink ENSURE BID Pt to drink ENSURE BID              Nutrition Goals Discharge (Final Nutrition Goals Re-Evaluation):  Nutrition Goals Re-Evaluation - 12/31/20 0758       Goals   Current Weight 107 lb 5.8 oz (48.7 kg)    Nutrition Goal Pt to identify food quantities necessary to achieve weight gain of 0.5 lbs per week without fluid retention      Personal Goal #2 Re-Evaluation   Personal Goal #2 Pt to build a healthy plate including vegetables, fruits, whole grains, and low-fat dairy products in a heart healthy meal plan.      Personal Goal #3 Re-Evaluation   Personal Goal #3 Pt to drink ENSURE BID             Psychosocial: Target Goals: Acknowledge presence or absence of significant depression and/or stress, maximize coping skills, provide positive support system. Participant is able to verbalize types and ability to use techniques and skills needed for reducing stress and depression.  Initial Review & Psychosocial Screening:  Initial Psych Review & Screening - 12/06/20 0839       Initial Review   Current issues with Current Stress Concerns    Source of Stress Concerns Chronic Illness;Unable to participate in former  interests or hobbies;Unable to perform yard/household activities      [place]? Yes   Tracy Nelson lives with her daughter and also has a son for support     Barriers   Psychosocial barriers to participate in program The patient should benefit from training in stress management and relaxation.      Screening Interventions   Interventions Encouraged to exercise;To provide support and resources with identified psychosocial needs    Expected Outcomes Long Term Goal: Stressors or current issues are controlled or eliminated.             Quality of Life Scores:  Quality of Life - 12/05/20 1601       Quality of Life   Select Quality of Life      Quality of Life Scores   Health/Function Pre 22.5 %    Socioeconomic Pre 27.1 %    Psych/Spiritual Pre 27.43 %    Family Pre 30 %    GLOBAL  Pre 25.63 %            Scores of 19 and below usually indicate a poorer quality of life in these areas.  A difference of  2-3 points is a clinically meaningful difference.  A difference of 2-3 points in the total score of the Quality of Life Index has been associated with significant improvement in overall quality of life, self-image, physical symptoms, and general health in studies assessing change in quality of life.  PHQ-9: Recent Review Flowsheet Data     Depression screen [facility] 2/9 12/06/2020 11/14/2020   Decreased Interest 0 0   Down, Depressed, Hopeless 0 0   PHQ - 2 Score 0 0   Altered sleeping - 0   Tired, decreased energy - 0   Change in appetite - 0   Feeling bad or failure about yourself  - 0   Trouble concentrating - 0   Moving slowly or fidgety/restless - 0   Suicidal thoughts - 0   PHQ-9 Score - 0      Interpretation of Total Score  Total Score Depression Severity:  1-4 = Minimal depression, 5-9 = Mild depression, 10-14 = Moderate depression, 15-19 = Moderately severe depression, 20-27 = Severe depression   Psychosocial Evaluation and  Intervention:   Psychosocial Re-Evaluation:  Psychosocial Re-Evaluation     Row Name 12/31/20 1641 01/29/21 1624           Psychosocial Re-Evaluation   Current issues with Current Stress Concerns Current Stress Concerns      Comments Tracy Nelson has not voiced any increased concerns or stressors since participating in phase 2 cardiac rehab Tracy Nelson's last day of participation at phase 2 cardiac rehab was on 01/15/21. Tracy Nelson is currently out of town. Unable to assess.      Expected Outcomes Tracy Nelson will have controlled stress/ stressors at the completion of phase 2 cardiac rehab if possible Tracy Nelson will have controlled stress/ stressors at the completion of phase 2 cardiac rehab if possible      Interventions Encouraged to attend Cardiac Rehabilitation for the exercise;Stress management education Encouraged to attend Cardiac Rehabilitation for the exercise;Stress management education      Continue Psychosocial Services  -- Follow up required by staff             Initial Review      Source of Stress Concerns Chronic Illness;Unable to perform yard/household activities;Retirement/disability;Unable to participate in former interests or hobbies;Transportation Chronic Illness;Unable to perform yard/household activities;Retirement/disability;Unable to participate in former interests or hobbies;Transportation      Comments Will continue to offer support as needed Will continue to offer support as needed              Psychosocial Discharge (Final Psychosocial Re-Evaluation):  Psychosocial Re-Evaluation - 01/29/21 1624       Psychosocial Re-Evaluation   Current issues with Current Stress Concerns    Comments Cash's last day of participation at phase 2 cardiac rehab was on 01/15/21. Grete is currently out of town. Unable to assess.    Expected Outcomes Tracy Nelson will have controlled stress/ stressors at the completion of phase 2 cardiac rehab if possible    Interventions Encouraged to attend Cardiac  Rehabilitation for the exercise;Stress management education    Continue Psychosocial Services  Follow up required by staff      Initial Review   Source of Stress Concerns Chronic Illness;Unable to perform yard/household activities;Retirement/disability;Unable to participate in former interests or hobbies;Transportation    Comments Will continue to offer  support as needed             Vocational Rehabilitation: Provide vocational rehab assistance to qualifying candidates.   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 Participant will state understanding/return demonstration of topics presented.  Learning Barriers/Preferences:  Learning Barriers/Preferences - 12/05/20 1600       Learning Barriers/Preferences   Learning Barriers Exercise Concerns   Poor mobility   Learning Preferences Individual Instruction;Pictoral;Skilled Demonstration;Written Material;Video             Education Topics: Hypertension, Hypertension Reduction -Define heart disease and high blood pressure. Discus how high blood pressure affects the body and ways to reduce high blood pressure.   Exercise and Your Heart -Discuss why it is important to exercise, the FITT principles of exercise, normal and abnormal responses to exercise, and how to exercise safely.   Angina -Discuss definition of angina, causes of angina, treatment of angina, and how to decrease risk of having angina.   Cardiac Medications -Review what the following cardiac medications are used for, how they affect the body, and side effects that may occur when taking the medications.   Medications include Aspirin, Beta blockers, calcium channel blockers, ACE Inhibitors, angiotensin receptor blockers, diuretics, digoxin, and antihyperlipidemics.   Congestive Heart Failure -Discuss the definition of CHF, how to live with CHF, the signs and symptoms of CHF, and how keep track of weight and sodium intake.   Heart Disease and Intimacy -Discus the effect sexual activity has on the heart, how changes occur during intimacy as we age, and safety during sexual activity.   Smoking Cessation / COPD -Discuss different methods to quit smoking, the health benefits of quitting smoking, and the definition of COPD.   Nutrition I: Fats -Discuss the types of cholesterol, what cholesterol does to the heart, and how cholesterol levels can be controlled.   Nutrition II: Labels -Discuss the different components of food labels and how to read food label   Heart Parts/Heart Disease and PAD -Discuss the anatomy of the heart, the pathway of blood circulation through the heart, and these are affected by heart disease.   Stress I: Signs and Symptoms -Discuss the causes of stress, how stress may lead to anxiety and depression, and ways to limit stress.   Stress II: Relaxation -Discuss different types of relaxation techniques to limit stress.   Warning Signs of Stroke / TIA -Discuss definition of a stroke, what the signs and symptoms are of a stroke, and how to identify when someone is having stroke.   Knowledge Questionnaire Score:  Knowledge Questionnaire Score - 12/05/20 1653       Knowledge Questionnaire Score   Pre Score 17/24             Core Components/Risk Factors/Patient Goals at Admission:  Personal Goals and Risk Factors at Admission - 12/05/20 1600       Core Components/Risk Factors/Patient Goals on Admission    Weight Management Yes;Weight Gain    Intervention Weight Management: Develop a combined nutrition and exercise program designed to reach desired  caloric intake, while maintaining appropriate intake of nutrient and  fiber, sodium and fats, and appropriate energy expenditure required for the weight goal.;Weight Management: Provide education and appropriate resources to help participant work on and attain dietary goals.    Expected Outcomes Weight Gain: Understanding of general recommendations for a high calorie, high protein meal plan that promotes weight gain by distributing calorie intake throughout the day with the consumption for 4-5 meals, snacks, and/or supplements;Weight Maintenance: Understanding of the daily nutrition guidelines, which includes 25-35% calories from fat, 7% or less cal from saturated fats, less than 273m cholesterol, less than 1.5gm of sodium, & 5 or more servings of fruits and vegetables daily;Long Term: Adherence to nutrition and physical activity/exercise program aimed toward attainment of established weight goal;Short Term: Continue to assess and modify interventions until short term weight is achieved    Heart Failure Yes    Intervention Provide a combined exercise and nutrition program that is supplemented with education, support and counseling about heart failure. Directed toward relieving symptoms such as shortness of breath, decreased exercise tolerance, and extremity edema.    Expected Outcomes Short term: Attendance in program 2-3 days a week with increased exercise capacity. Reported lower sodium intake. Reported increased fruit and vegetable intake. Reports medication compliance.;Short term: Daily weights obtained and reported for increase. Utilizing diuretic protocols set by physician.;Long term: Adoption of self-care skills and reduction of barriers for early signs and symptoms recognition and intervention leading to self-care maintenance.    Hypertension Yes    Intervention Provide education on lifestyle modifcations including regular physical activity/exercise, weight management, moderate sodium restriction and  increased consumption of fresh fruit, vegetables, and low fat dairy, alcohol moderation, and smoking cessation.;Monitor prescription use compliance.    Expected Outcomes Short Term: Continued assessment and intervention until BP is < 140/934mHG in hypertensive participants. < 130/8023mG in hypertensive participants with diabetes, heart failure or chronic kidney disease.;Long Term: Maintenance of blood pressure at goal levels.    Personal Goal Other Yes    Personal Goal Pt wants to walk more, increase heart strength and have more energy    Intervention Will continue to monitor pt and will progress workloads as tolerated without sign or symptom    Expected Outcomes Pt will be able to increase her exercise an walking sessions             Core Components/Risk Factors/Patient Goals Review:   Goals and Risk Factor Review     Row Name 12/31/20 1645 01/29/21 1626           Core Components/Risk Factors/Patient Goals Review   Personal Goals Review Weight Management/Obesity;Heart Failure;Stress Weight Management/Obesity;Heart Failure;Stress      Review Tracy Nelson's systolic BP's remain in the 90's. Patient is asymptomatic. Dr MclAundra Dubin aware. Tracy Nelson doing fair with exercise for her fitness level. The patient remains deconditioned but reports feeling a little stronger since participating in phase 2 cardiac rehab. Tracy Nelson's systolic BP's remain in the 90's. Patient is asymptomatic.  Tracy Nelson doing fair with exercise for her fitness level. Tracy Nelson not exercised at cardiac rehab since 01/15/21. Tracy Nelson been given 2 more exercise sessions for next week as she is currently out of town      Expected [place] continue to participate in phase 2 cardiac rehab for risk factor modification. [place] continue to participate in phase 2 cardiac rehab for risk factor modification.               Core Components/Risk Factors/Patient Goals at Discharge (Final Review):  Goals and Risk Factor  Review - 01/29/21 1626       Core Components/Risk Factors/Patient Goals Review   Personal Goals Review Weight Management/Obesity;Heart Failure;Stress    Review Lizzy's systolic BP's remain in the 90's. Patient is asymptomatic.  Mishal is doing fair with exercise for her fitness level. Antionetta has not exercised at cardiac rehab since 01/15/21. Larya has been given 2 more exercise sessions for next week as she is currently out of town    Expected Cameron will continue to participate in phase 2 cardiac rehab for risk factor modification.             ITP Comments:  ITP Comments     Row Name 12/05/20 1504 12/31/20 1639 01/29/21 1628       ITP Comments Dr Fransico Him MD, Medical Director 30 Day ITP Review. Oliver has good participation for her fitness level when in attendance at cardiac rehab. There have been some transportation issues with the ride service. 30 Day ITP Review. Zamira has good participation for her fitness level when in attendance at cardiac rehab. Amyia is currently out of town. Luisana's last exercise session was on 01/15/21.              Comments: See ITP comments.Barnet Pall, RN,BSN 01/29/2021 4:32 PM

## 2021-01-31 ENCOUNTER — Encounter (HOSPITAL_COMMUNITY): Payer: Medicare PPO

## 2021-02-03 ENCOUNTER — Ambulatory Visit (HOSPITAL_COMMUNITY): Payer: Medicare PPO

## 2021-02-04 ENCOUNTER — Telehealth (HOSPITAL_COMMUNITY): Payer: Self-pay

## 2021-02-04 NOTE — Telephone Encounter (Signed)
I called pt to see if she is back home for a home visit, she is still out of town, she does have clinic appointment next week and she will be back for that. I will plan to see her then.  She said she still has episodes of feeling sob when she gets up, advised her to move slowly and takes breaks when needed, also to control her breathing.  She reports her weights are the same around 100lbs. She reports her bumex is 2 tablets in the morning and 1 in the evening some doctor but she cant remember who. (Kidney doc maybe)   Will see her in clinic next week.   Marylouise Stacks, EMT-Paramedic  02/04/21

## 2021-02-05 ENCOUNTER — Encounter (HOSPITAL_COMMUNITY): Payer: Self-pay | Admitting: *Deleted

## 2021-02-05 ENCOUNTER — Ambulatory Visit (HOSPITAL_COMMUNITY): Payer: Medicare PPO

## 2021-02-05 DIAGNOSIS — I5022 Chronic systolic (congestive) heart failure: Secondary | ICD-10-CM

## 2021-02-05 NOTE — Progress Notes (Signed)
Discharge Progress Report  Patient Details  Name: Tracy Nelson MRN: [id number] Date of Birth: Dec 15, 1945 Referring Provider:   Flowsheet Row CARDIAC REHAB PHASE II ORIENTATION from 12/05/2020 in [place] [facility]  Referring Provider Loralie Champagne, MD        Number of Visits: 8  Reason for Discharge:  Patient reached a stable level of exercise.  Smoking History:  Social History   Tobacco Use  Smoking Status Never  Smokeless Tobacco Never    Diagnosis:  Chronic systolic congestive heart failure ([place])  ADL UCSD:   Initial Exercise Prescription:   Discharge Exercise Prescription (Final Exercise Prescription Changes):  Exercise Prescription Changes - 01/15/21 1500       Response to Exercise   Blood Pressure (Admit) 100/68    Blood Pressure (Exercise) 108/72    Blood Pressure (Exit) 96/58    Heart Rate (Admit) 91 bpm    Heart Rate (Exercise) 101 bpm    Heart Rate (Exit) 93 bpm    Rating of Perceived Exertion (Exercise) 13    Symptoms Fatigue    Comments Pt's last day of exercise    Duration Continue with 30 min of aerobic exercise without signs/symptoms of physical distress.    Intensity THRR unchanged      Progression   Progression Continue to progress workloads to maintain intensity without signs/symptoms of physical distress.    Average METs 1.4      Resistance Training   Training Prescription No    Weight No weight on Wednesdays      Interval Training   Interval Training No      NuStep   Level 1    Minutes 30    METs 1.4             Functional Capacity:   Psychological, QOL, Others - Outcomes: PHQ 2/9: Depression screen [facility] 2/9 12/06/2020 11/14/2020  Decreased Interest 0 0  Down, Depressed, Hopeless 0 0  PHQ - 2 Score 0 0  Altered sleeping - 0  Tired, decreased energy - 0  Change in appetite - 0  Feeling bad or failure about yourself  - 0  Trouble concentrating - 0  Moving slowly or fidgety/restless - 0   Suicidal thoughts - 0  PHQ-9 Score - 0    Quality of Life:   Personal Goals: Goals established at orientation with interventions provided to work toward goal.    Personal Goals Discharge:  Goals and Risk Factor Review     Row Name 12/31/20 1645 01/29/21 1626           Core Components/Risk Factors/Patient Goals Review   Personal Goals Review Weight Management/Obesity;Heart Failure;Stress Weight Management/Obesity;Heart Failure;Stress      Review Larene's systolic BP's remain in the 90's. Patient is asymptomatic. Dr Aundra Dubin is aware. Darolyn is doing fair with exercise for her fitness level. The patient remains deconditioned but reports feeling a little stronger since participating in phase 2 cardiac rehab. Maddalena's systolic BP's remain in the 90's. Patient is asymptomatic.  Kamera is doing fair with exercise for her fitness level. Patricie has not exercised at cardiac rehab since 01/15/21. Nnenna has been given 2 more exercise sessions for next week as she is currently out of town      Expected [place] will continue to participate in phase 2 cardiac rehab for risk factor modification. [place] will continue to participate in phase 2 cardiac rehab for risk factor modification.  Exercise Goals and Review:   Exercise Goals Re-Evaluation:  Exercise Goals Re-Evaluation     Row Name 12/09/20 1615 12/10/20 0800 01/08/21 1615         Exercise Goal Re-Evaluation   Exercise Goals Review Increase Physical Activity;Increase Strength and Stamina;Able to understand and use rate of perceived exertion (RPE) scale;Knowledge and understanding of Target Heart Rate Range (THRR);Understanding of Exercise Prescription -- Increase Physical Activity;Increase Strength and Stamina;Able to understand and use rate of perceived exertion (RPE) scale;Understanding of Exercise Prescription;Knowledge and understanding of Target Heart Rate Range (THRR)     Comments Pt's first day in the CRP2  program. Pt understands exercise Rx, RPE scale, and THRR. -- Reviewed METs and Goals.     Expected Outcomes Will continue to monitor and progress patient as tolerated. -- Will continue to monitor and progress patient as tolerated.              Nutrition & Weight - Outcomes:    Nutrition:  Nutrition Therapy & Goals - 12/12/20 0917       Nutrition Therapy   Diet High Calorie diet; Sodium 2000 mg      Personal Nutrition Goals   Nutrition Goal Pt to identify food quantities necessary to achieve weight gain of 0.5 lbs per week without fluid retention    Personal Goal #2 Pt to build a healthy plate including vegetables, fruits, whole grains, and low-fat dairy products in a heart healthy meal plan.    Personal Goal #3 Pt to drink ENSURE BID      Intervention Plan   Intervention Prescribe, educate and counsel regarding individualized specific dietary modifications aiming towards targeted core components such as weight, hypertension, lipid management, diabetes, heart failure and other comorbidities.;Nutrition handout(s) given to patient.    Expected Outcomes Short Term Goal: A plan has been developed with personal nutrition goals set during dietitian appointment.;Long Term Goal: Adherence to prescribed nutrition plan.             Nutrition Discharge:   Education Questionnaire Score:  Channel attended 8 exercise classes between 12/05/20-01/15/21. Zulay remained very  deconditioned during her participation in the class. Rozlynn increased her mets from 1.0 to 1.4. Zakaiya enjoyed coming to cardiac rehab. Zainah did have some issues with transportation. Margaretha was out of town due to an ill family member and did not get to finish her sessions. Clary reported that she felt stronger after participating in the program.Saryiah Bencosme Venetia Maxon, RN,BSN 02/11/2021 12:29 PM

## 2021-02-11 NOTE — Progress Notes (Signed)
Cardiology: Dr. Aundra Dubin  75 y.o. with history of rheumatoid arthritis, CKD stage 3, and nonischemic cardiomyopathy was referred by Dr. Andrey Cota in [place] to establish heart failure care in [place].  Patient has been known to have a cardiomyopathy since 2015.  Cardiac MRI in 2015 showed EF 37% with no LGE.  Coronary angiography at that time showed no significant coronary disease.  Over the next few years, LV EF fluctuated up and down.  In 3/21, echo showed EF down to 20-25%.  She was admitted to the hospital in [place], [place] in 11/21 with CHF exacerbation.  Echo showed EF 20-25%, global hypokinesis, mildly decreased RV systolic function.  Creatinine was noted to be elevated. She was diuresed and sent home, but was readmitted later in 11/21 with ongoing volume overload. Creatinine was up to 2.89.  She was taken off most of her cardiac meds due to soft BP and elevated creatinine. She was discharged to her daughter's house in [place].   I repeated an echo on her in 12/21, EF < 20% with moderate LV dilation and mildly decreased RV systolic function. RHC was done, surprisingly showing low filling pressures and relatively preserved cardiac index at 2.22.   She was admitted in 3/22 with atrial fibrillation, converted to NSR on amiodarone. She went into atrial flutter after this and had TEE-DCCV in 4/22.  TEE showed EF < 20%, moderate LV dilation, mild RV dilation with moderate RV dysfunction, moderate central MR.  CPX in 3/22 showed severe HF limitation.  PYP scan 4/22 was grade 1 with H/CL 1.48, equivocal.    She was evaluated for [facility] or ANTHEM, BNP too high and thought to be too frail.   Patient returned 5/22 for followup of CHF. She was in NSR today.  Weight down 7 lbs.  She saw GI for dysphagia and was thought to have esophageal candidiasis, treated with Nystatin.  Still very fatigued.  Dyspnea walking longer distances, generally ok walking around the house. Lightheaded with standing at times,  no falls.  Feels like CR is helping.  Today she returns for HF follow up with her daughter. Overall feeling fine. Fatigued with activity. Feels ribs are caving in when she gets SOB. Denies increasing SOB, CP, dizziness, edema, or PND/Orthopnea. Appetite ok. No fever or chills. Weight at home 101 pounds. Taking all medications. Finished cardiac rehab.  ECG (personally reviewed): NSR 87 bpm, qtc 505 ms.  Labs (12/21): K 5 => 3.7, creatinine 2.89 => 1.9 => 1.69, BNP 2904 Labs (1/22): K 4, creatinine 1.9, hgb 12.5 Labs (3/22): K 3.6, creatinine 2.36, hgb 11.6, TSH normal Labs (4/22): K 3.7, creatinine 2.25, LFTs normal, urine immunofixation normal, pro-BNP 6498 Labs (5/22): K 3.7, creatinine 2.24  PMH: 1. GERD 2. Rheumatoid arthritis: Followed by rheumatology in [place].  - No known pulmonary disease from RA.  3. Osteoporosis. 4. Gout 5. CKD stage 4 6. H/o SVT 7. Chronic systolic CHF: She was found to have nonischemic cardiomyopathy with low EF in 2015.  - LHC (2015): Normal coronaries.  - cardiac MRI (2015): EF 37%, no LGE - Echo (3/21): EF 20-25%. - Echo (11/21): EF 20-25%, moderate LV dilation, global hypokinesis, biatrial enlargement, mildly decreased RV systolic function.  - Echo (12/21): EF < 20%, moderate LV dilation, normal RV size/systolic function, moderate MR, IVC normal.  - RHC (1/22): Mean RA 1, PA 39/14, mean PCWP 10, PVR 3.9 WU, CI 2.22.  - CPX (3/22): peak VO2 10.1, VE/VCO2 55, RER 1.13.  Severe functional limitation  due to HF.  - TEE (4/22): EF < 20%, moderate LV dilation, mild RV dilation with moderate RV dysfunction, moderate central MR. - PYP scan (4/22): grade 1 with H/CL 1.48. Equivocal.  8. Atrial fibrillation/flutter: TEE-DCCV in 4/22.   SH: From [place] but recently moved to [place] to live with daughter.  Has 3 children.  Nonsmoker.  No ETOH.   FH: Mother with CHF, father with MI.   ROS: All systems reviewed and negative except as per HPI.    Current Outpatient Medications  Medication Sig Dispense Refill   acetaminophen (TYLENOL) 500 MG tablet Take 500-1,000 mg by mouth every 6 (six) hours as needed for moderate pain.     amiodarone (PACERONE) 200 MG tablet Take 1 tablet (200 mg total) by mouth daily. 60 tablet 6   apixaban (ELIQUIS) 2.5 MG TABS tablet Take 1 tablet (2.5 mg total) by mouth 2 (two) times daily. 60 tablet 6   cholestyramine (QUESTRAN) 4 g packet Take 1 packet (4 g total) by mouth daily. 60 each 12   cycloSPORINE (RESTASIS) 0.05 % ophthalmic emulsion Place 1 drop into both eyes 2 (two) times daily. 1.5 mL 2   dapagliflozin propanediol (FARXIGA) 10 MG TABS tablet Take 1 tablet (10 mg total) by mouth daily before breakfast. 30 tablet 5   Ensure (ENSURE) Take 237 mLs by mouth 2 (two) times daily between meals.     ergocalciferol (VITAMIN D2) 1.25 MG (50000 UT) capsule Take 1 capsule (50,000 Units total) by mouth once a week. 10 capsule 3   folic acid (FOLVITE) 1 MG tablet TAKE 1 TABLET BY MOUTH EVERY DAY 30 tablet 3   hydroxychloroquine (PLAQUENIL) 200 MG tablet TAKE 1 TABLET BY MOUTH EVERY DAY 30 tablet 11   Menthol, Topical Analgesic, (ICY HOT BACK EX) Apply 1 application topically daily as needed (pain).     MITIGARE 0.6 MG CAPS Take 0.6 mg by mouth daily as needed (gout).     Multiple Vitamin (MULTIVITAMIN) tablet Take 1 tablet by mouth daily.     omeprazole (PRILOSEC) 20 MG capsule Take 1 capsule by mouth daily.     omeprazole (PRILOSEC) 40 MG capsule Take 40 mg by mouth daily.     polyethylene glycol (MIRALAX / GLYCOLAX) 17 g packet Take 17 g by mouth as needed.     predniSONE (DELTASONE) 5 MG tablet TAKE 1 TABLET BY MOUTH EVERY DAY WITH BREAKFAST 30 tablet 3   Vericiguat (VERQUVO) 10 MG TABS Take 10 mg by mouth daily. 30 tablet 11   No current facility-administered medications for this encounter.   Wt Readings from Last 3 Encounters:  02/12/21 48.1 kg (106 lb)  01/09/21 48.5 kg (107 lb)  12/26/20 47.6 kg  (105 lb)   BP (!) 84/62   Pulse 84   Wt 48.1 kg (106 lb)   SpO2 95%   BMI 19.39 kg/m  General:  NAD. No resp difficulty, frail. HEENT: Normal Neck: Supple. No JVD. Carotids 2+ bilat; no bruits. No lymphadenopathy or thryomegaly appreciated. Cor: PMI nondisplaced. Regular rate & rhythm. No rubs, gallops or murmurs. Lungs: Clear Abdomen: Soft, +tender, nondistended. No hepatosplenomegaly. No bruits or masses. Good bowel sounds. Extremities: No cyanosis, clubbing, rash, edema Neuro: Alert & oriented x 3, cranial nerves grossly intact. Moves all 4 extremities w/o difficulty. Affect pleasant.  Assessment/Plan: 1. Chronic systolic CHF: Nonischemic cardiomyopathy.  This has been known since 2015, cath in 2015 showed no coronary disease and cardiac MRI in 2015 showed no LGE.  Cause is uncertain, familial cardiomyopathy is a concern given nonischemic cardiomyopathy in her mother.  Cannot rule out remote viral myocarditis. She remains quite limited, primarily by fatigue, NYHA class IIIb.  Poor appetite and severe HF limitation on CPX in 3/22 is concerning, as is elevated creatinine (suspect cardiorenal syndrome).  Low BP and elevated creatinine have limited her cardiac med regimen. Most recent echo in 12/21 showed EF < 20% with normal RV systolic function. RHC (1/22) showed low filling pressures and actually a relatively preserved cardiac index of 2.22.  She is not volume overloaded on exam today.  - Continue bumetanide 2 mg AM/1 mg PM. BMET today.  - Wear compression stockings during the day.   - Continue Verquvo 10 mg daily.  - Continue dapagliflozin 10 mg daily.  - Off spironolactone with recent rise in creatinine and soft BP.  - Dr. Aundra Dubin has mentioned [employer] gene testing for possible familial cardiomyopathy. She will think about this.  - Though RHC was not markedly abnormal, CPX showed severe functional limitation due to HF.  I am worried that she is near end-stage.  She is frail with CKD stage  4, I do not think that she would be a candidate for LVAD.  - With renal failure, CHF, and atrial arrhythmias, we have assessed for cardiac amyloidosis.  PYP scan was equivocal in 4/22, will repeat in 4 months. Urine immunofixation negative, still need myeloma panel.  - Finished CR.  - Thought to be too frail with advanced HF for barostimulation activation therapy or vagal nerve stimulation ([place] and ANTHEM trial).  - Does not have an ICD, would not place given NICM, advanced age, and concern for nearing end stage HF.  2. CKD: Stage 4.   - BMET today.  - She has a nephrology appt next week.  3. Rheumatoid arthritis: No history of lung involvement. She has been on a low dose of prednisone chronically.  - Has been seen by rheumatology. 4. Dysphagia: Treated by GI for esophageal candidiasis. Resolved. 5. Atrial fibrillation/flutter: S/p TEE-DCCV in 4/22.  She is now on amiodarone and in NSR.  - Continue amiodarone 200 mg daily. LFTs and TSH normal recently.  Will need regular eye exam.  - Continue Eliquis 5 mg bid.   Followup in 2-3 months with Dr. Wynema Birch [facility] [name] 02/12/2021

## 2021-02-11 NOTE — Addendum Note (Signed)
Encounter addended by: Lesly Rubenstein on: 02/11/2021 9:04 AM  Actions taken: Flowsheet data copied forward, Flowsheet accepted

## 2021-02-11 NOTE — Addendum Note (Signed)
Encounter addended by: George Ina, RD on: 02/11/2021 9:20 AM  Actions taken: Flowsheet accepted

## 2021-02-12 ENCOUNTER — Other Ambulatory Visit (HOSPITAL_COMMUNITY): Payer: Self-pay

## 2021-02-12 ENCOUNTER — Other Ambulatory Visit: Payer: Self-pay

## 2021-02-12 ENCOUNTER — Ambulatory Visit (HOSPITAL_COMMUNITY)
Admission: RE | Admit: 2021-02-12 | Discharge: 2021-02-12 | Disposition: A | Discharge status: Home / Self Care | Payer: Medicare PPO | Source: Ambulatory Visit | Attending: Family Medicine | Admitting: Family Medicine

## 2021-02-12 ENCOUNTER — Encounter (HOSPITAL_COMMUNITY): Payer: Self-pay

## 2021-02-12 VITALS — BP 84/62 | HR 84 | Wt 106.0 lb

## 2021-02-12 DIAGNOSIS — Z7952 Long term (current) use of systemic steroids: Secondary | ICD-10-CM | POA: Diagnosis not present

## 2021-02-12 DIAGNOSIS — I5022 Chronic systolic (congestive) heart failure: Secondary | ICD-10-CM | POA: Insufficient documentation

## 2021-02-12 DIAGNOSIS — Z8249 Family history of ischemic heart disease and other diseases of the circulatory system: Secondary | ICD-10-CM | POA: Diagnosis not present

## 2021-02-12 DIAGNOSIS — B3781 Candidal esophagitis: Secondary | ICD-10-CM | POA: Diagnosis not present

## 2021-02-12 DIAGNOSIS — I428 Other cardiomyopathies: Secondary | ICD-10-CM | POA: Diagnosis not present

## 2021-02-12 DIAGNOSIS — Z79899 Other long term (current) drug therapy: Secondary | ICD-10-CM | POA: Insufficient documentation

## 2021-02-12 DIAGNOSIS — R7989 Other specified abnormal findings of blood chemistry: Secondary | ICD-10-CM | POA: Insufficient documentation

## 2021-02-12 DIAGNOSIS — Z7901 Long term (current) use of anticoagulants: Secondary | ICD-10-CM | POA: Insufficient documentation

## 2021-02-12 DIAGNOSIS — Z7984 Long term (current) use of oral hypoglycemic drugs: Secondary | ICD-10-CM | POA: Insufficient documentation

## 2021-02-12 DIAGNOSIS — N184 Chronic kidney disease, stage 4 (severe): Secondary | ICD-10-CM | POA: Diagnosis not present

## 2021-02-12 DIAGNOSIS — I4891 Unspecified atrial fibrillation: Secondary | ICD-10-CM | POA: Insufficient documentation

## 2021-02-12 DIAGNOSIS — I4892 Unspecified atrial flutter: Secondary | ICD-10-CM

## 2021-02-12 DIAGNOSIS — I5020 Unspecified systolic (congestive) heart failure: Secondary | ICD-10-CM

## 2021-02-12 DIAGNOSIS — M069 Rheumatoid arthritis, unspecified: Secondary | ICD-10-CM

## 2021-02-12 LAB — BASIC METABOLIC PANEL
Anion gap: 11 (ref 5–15)
BUN: 37 mg/dL — ABNORMAL HIGH (ref 8–23)
CO2: 23 mmol/L (ref 22–32)
Calcium: 9.4 mg/dL (ref 8.9–10.3)
Chloride: 108 mmol/L (ref 98–111)
Creatinine, Ser: 2.14 mg/dL — ABNORMAL HIGH (ref 0.44–1.00)
GFR, Estimated: 24 mL/min — ABNORMAL LOW (ref >60)
Glucose, Bld: 95 mg/dL (ref 70–99)
Potassium: 3.5 mmol/L (ref 3.5–5.1)
Sodium: 142 mmol/L (ref 135–145)

## 2021-02-12 NOTE — Progress Notes (Signed)
Paramedicine Encounter   [name] , female,   DOB: Dec 16, 1945,75 y.o.,  MRN: [id number]   Met patient in clinic today with provider.  B/P-84/62 P-84 Sp02-95 Weight @ clinic-106 Weights @ home around 101  EKG done today-NSR  She is now done with cardiac rehab. She does report sometimes she gets BRB when she has BM and is constipated. Only a small amount and it is not every time.  Fluid level looks good today.  She is stable and it is ok for paramedicine to be d/c.     Patient is now discharged from [facility].  Patient has/has not met the following goals:  Yes :Patient expresses basic understanding of medications and what they are for Yes :Patient able to verbalize heart failure specific dietary/fluid restrictions Yes :Patient is aware of who to call if they have medical concerns or if they need to schedule or change appts Yes :Patient has a scale for daily weights and weighs regularly Yes :Patient able to verbalize concerning symptoms when they should call the HF clinic (weight gain ranges, etc) Yes :Patient has a PCP and has seen within the past year or has upcoming appt Yes :Patient has reliable access to getting their medications Yes :Patient has shown they are able to reorder medications reliably No :Patient has had admission in past 30 days- if yes how many? No :Patient has had admission in past 90 days- if yes how many?  Discharge Comments: She has clinic appoint in 2-3 months and if paramedicine is needed again at that time then we will get notified.   Marylouise Stacks, [place] 02/12/2021

## 2021-02-12 NOTE — Patient Instructions (Signed)
It was great to see you today! No medication changes are needed at this time.   Labs today We will only contact you if something comes back abnormal or we need to make some changes. Otherwise no news is good news!  Your physician recommends that you schedule a follow-up appointment in: 2-3 months with Dr Aundra Dubin  Do the following things EVERYDAY: Weigh yourself in the morning before breakfast. Write it down and keep it in a log. Take your medicines as prescribed Eat low salt foods--Limit salt (sodium) to 2000 mg per day.  Stay as active as you can everyday Limit all fluids for the day to less than 2 liters  At the [facility], you and your health needs are our priority. As part of our continuing mission to provide you with exceptional heart care, we have created designated Provider Care Teams. These Care Teams include your primary Cardiologist (physician) and Advanced Practice Providers (APPs- Physician Assistants and Nurse Practitioners) who all work together to provide you with the care you need, when you need it.   You may see any of the following providers on your designated Care Team at your next follow up: Dr Glori Bickers Dr Loralie Champagne Dr Patrice Paradise, NP Lyda Jester, [place] Ginnie Smart Audry Riles, PharmD   Please be sure to bring in all your medications bottles to every appointment.   If you have any questions or concerns before your next appointment please send [place] a message through [place] or call our office at [phone].    TO LEAVE A MESSAGE FOR THE NURSE SELECT OPTION 2, PLEASE LEAVE A MESSAGE INCLUDING: YOUR NAME DATE OF BIRTH CALL BACK NUMBER REASON FOR CALL**this is important as we prioritize the call backs  YOU WILL RECEIVE A CALL BACK THE SAME DAY AS LONG AS YOU CALL BEFORE 4:00 PM

## 2021-02-13 ENCOUNTER — Ambulatory Visit (INDEPENDENT_AMBULATORY_CARE_PROVIDER_SITE_OTHER): Payer: Medicare PPO | Admitting: Nurse Practitioner

## 2021-02-13 ENCOUNTER — Encounter: Payer: Self-pay | Admitting: Nurse Practitioner

## 2021-02-13 VITALS — BP 118/64 | HR 60 | Temp 98.6°F | Ht 62.4 in | Wt 105.6 lb

## 2021-02-13 DIAGNOSIS — I4891 Unspecified atrial fibrillation: Secondary | ICD-10-CM | POA: Diagnosis not present

## 2021-02-13 DIAGNOSIS — N184 Chronic kidney disease, stage 4 (severe): Secondary | ICD-10-CM | POA: Diagnosis not present

## 2021-02-13 DIAGNOSIS — Z0000 Encounter for general adult medical examination without abnormal findings: Secondary | ICD-10-CM | POA: Diagnosis not present

## 2021-02-13 DIAGNOSIS — M0579 Rheumatoid arthritis with rheumatoid factor of multiple sites without organ or systems involvement: Secondary | ICD-10-CM | POA: Diagnosis not present

## 2021-02-13 DIAGNOSIS — I4892 Unspecified atrial flutter: Secondary | ICD-10-CM

## 2021-02-13 DIAGNOSIS — I5022 Chronic systolic (congestive) heart failure: Secondary | ICD-10-CM

## 2021-02-13 NOTE — Progress Notes (Signed)
I,[name],acting as a Education administrator for [employer], NP.,have documented all relevant documentation on the behalf of [employer], NP,as directed by  Bary Castilla, NP while in the presence of Bary Castilla, NP.  This visit occurred during the SARS-CoV-2 public health emergency.  Safety protocols were in place, including screening questions prior to the visit, additional usage of staff PPE, and extensive cleaning of exam room while observing appropriate contact time as indicated for disinfecting solutions.  Subjective:     [name] , female    DOB: Jul 16, 1946 , 75 y.o.   MRN: [id number]   Chief Complaint  Patient presents with   Annual Exam    HPI  Patient is here for her physical exam. She is accompanied by her daughter.  She is seeing a cardiologist for her HF and she is followed rheumatologist. She also sees a Nephrologist: Dr Royce Macadamia  -Patient is complaint with her medications and has no other concerns today. She does state that she feels week and does not have a lot of energy. She does have muscle and joint pain especially in her back due to her arthritis. She does take tylenol for that which helps.     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   HFrEF (heart failure with reduced ejection fraction) ([place]) 07/15/2020   HTN (hypertension) 07/15/2020   Osteoporosis 07/15/2020   Palpitations 07/15/2020   Rheumatoid arthritis ([place]) 07/15/2020     Family History  Problem Relation Age of Onset   Hypertension Mother    Diabetes Mother    Hypertension Father    Diabetes Father    Cancer Brother    Arthritis Maternal Grandmother    Lung disease Paternal Grandfather      Current Outpatient Medications:    acetaminophen (TYLENOL) 500 MG tablet, Take 500-1,000 mg by mouth every 6 (six) hours as needed for  moderate pain., Disp: , Rfl:    amiodarone (PACERONE) 200 MG tablet, Take 1 tablet (200 mg total) by mouth daily., Disp: 60 tablet, Rfl: 6   apixaban (ELIQUIS) 2.5 MG TABS tablet, Take 1 tablet (2.5 mg total) by mouth 2 (two) times daily., Disp: 60 tablet, Rfl: 6   bumetanide (BUMEX) 1 MG tablet, Take 2 mg by mouth in the morning. 2 MG IN AM/1 MG IN PM, Disp: , Rfl:    bumetanide (BUMEX) 1 MG tablet, Take 1 mg by mouth every evening. 2 MG am/1 MG PM, Disp: , Rfl:    cholestyramine (QUESTRAN) 4 g packet, Take 1 packet (4 g total) by mouth daily., Disp: 60 each, Rfl: 12   cycloSPORINE (RESTASIS) 0.05 % ophthalmic emulsion, Place 1 drop into both eyes 2 (two) times daily., Disp: 1.5 mL, Rfl: 2   dapagliflozin propanediol (FARXIGA) 10 MG TABS tablet, Take 1 tablet (10 mg total) by mouth daily before breakfast., Disp: 30 tablet, Rfl: 5   Ensure (ENSURE), Take 237 mLs by mouth 2 (two) times daily between meals., Disp: , Rfl:    ergocalciferol (VITAMIN D2) 1.25 MG (50000 UT) capsule, Take 1 capsule (50,000 Units total) by mouth once a week., Disp: 10 capsule, Rfl: 3   folic acid (FOLVITE) 1 MG tablet, TAKE 1 TABLET BY MOUTH EVERY DAY, Disp: 30 tablet, Rfl: 3   hydroxychloroquine (PLAQUENIL) 200 MG tablet, TAKE 1 TABLET BY MOUTH EVERY DAY, Disp: 30  tablet, Rfl: 11   Menthol, Topical Analgesic, (ICY HOT BACK EX), Apply 1 application topically daily as needed (pain)., Disp: , Rfl:    MITIGARE 0.6 MG CAPS, Take 0.6 mg by mouth daily as needed (gout)., Disp: , Rfl:    Multiple Vitamin (MULTIVITAMIN) tablet, Take 1 tablet by mouth daily., Disp: , Rfl:    omeprazole (PRILOSEC) 40 MG capsule, Take 40 mg by mouth daily., Disp: , Rfl:    predniSONE (DELTASONE) 5 MG tablet, TAKE 1 TABLET BY MOUTH EVERY DAY WITH BREAKFAST, Disp: 30 tablet, Rfl: 3   Vericiguat (VERQUVO) 10 MG TABS, Take 10 mg by mouth daily., Disp: 30 tablet, Rfl: 11   polyethylene glycol (MIRALAX / GLYCOLAX) 17 g packet, Take 17 g by mouth as  needed., Disp: , Rfl:    Allergies  Allergen Reactions   Baclofen Nausea And Vomiting   Penicillins Rash      The patient states she uses none for birth control. Last LMP was No LMP recorded. Patient is postmenopausal.. Negative for Dysmenorrhea. Negative for: breast discharge, breast lump(s), breast pain and breast self exam. Associated symptoms include abnormal vaginal bleeding. Pertinent negatives include abnormal bleeding (hematology), anxiety, decreased libido, depression, difficulty falling sleep, dyspareunia, history of infertility, nocturia, sexual dysfunction, sleep disturbances, urinary incontinence, urinary urgency, vaginal discharge and vaginal itching. Diet regular.The patient states her exercise level is    . The patient's tobacco use is:  Social History   Tobacco Use  Smoking Status Never  Smokeless Tobacco Never  . She has been exposed to passive smoke. The patient's alcohol use is:  Social History   Substance and Sexual Activity  Alcohol Use Never  . Additional information: Last pap    Review of Systems  Constitutional:  Positive for fatigue. Negative for chills and fever.  HENT:  Negative for congestion and sinus pain.   Respiratory: Negative.  Negative for cough, shortness of breath and wheezing.   Cardiovascular: Negative.   Gastrointestinal: Negative.   Endocrine: Negative for polydipsia, polyphagia and polyuria.  Musculoskeletal:  Positive for back pain. Negative for arthralgias, myalgias and neck stiffness.  Neurological:  Positive for weakness. Negative for tremors, numbness and headaches.  Psychiatric/Behavioral: Negative.      Today's Vitals   02/13/21 0913  BP: 118/64  Pulse: 60  Temp: 98.6 F (37 C)  TempSrc: Oral  Weight: 105 lb 9.6 oz (47.9 kg)  Height: 5' 2.4" (1.585 m)   Body mass index is 19.07 kg/m.   Objective:  Physical Exam Vitals and nursing note reviewed.  Constitutional:      Appearance: Normal appearance.  HENT:     Head:  Normocephalic and atraumatic.     Right Ear: Tympanic membrane, ear canal and external ear normal. There is no impacted cerumen.     Left Ear: Tympanic membrane, ear canal and external ear normal. There is no impacted cerumen.     Nose: Nose normal. No congestion or rhinorrhea.     Mouth/Throat:     Mouth: Mucous membranes are moist.     Pharynx: Oropharynx is clear.  Eyes:     Extraocular Movements: Extraocular movements intact.     Conjunctiva/sclera: Conjunctivae normal.     Pupils: Pupils are equal, round, and reactive to light.  Cardiovascular:     Rate and Rhythm: Normal rate and regular rhythm.     Pulses: Normal pulses.     Heart sounds: Normal heart sounds. No murmur heard. Pulmonary:     Effort: Pulmonary  effort is normal. No respiratory distress.     Breath sounds: Normal breath sounds. No wheezing.  Chest:  Breasts:    Tanner Score is 5.     Right: Normal.     Left: Normal.  Abdominal:     General: Abdomen is flat. Bowel sounds are normal.     Palpations: Abdomen is soft.     Tenderness: There is no abdominal tenderness. There is no guarding.  Genitourinary:    Comments: deferred Musculoskeletal:        General: No swelling. Normal range of motion.     Cervical back: Normal range of motion and neck supple.  Skin:    General: Skin is warm and dry.     Capillary Refill: Capillary refill takes less than 2 seconds.  Neurological:     General: No focal deficit present.     Mental Status: She is alert and oriented to person, place, and time.  Psychiatric:        Mood and Affect: Mood normal.        Behavior: Behavior normal.        Assessment And Plan:     1. Encounter for annual physical exam --Patient is here for their annual physical exam and we discussed any changes to medication and medical history.  -Behavior modification was discussed as well as diet and exercise history  -Patient will continue to exercise regularly and modify their diet.   -Recommendation for yearly physical annuals, immunization and screenings including mammogram and colonoscopy were discussed with the patient.  -Recommended intake of multivitamin, vitamin D and calcium.  -Individualized advise was given to the patient pertaining to their own health history in regards to diet, exercise, medical condition and referrals.  - POCT Urinalysis Dipstick (81002)- unable to void in office  - POCT UA - Microalbumin- unable to urinate in office.  - CBC - Hemoglobin A1c - Lipid panel - Hepatitis C antibody  2. Chronic systolic CHF (congestive heart failure) ([place]) -Followed by cardiologist. Had a TEE in 4/22  -Continue meds and follow by cardiologist.   3. Atrial fibrillation and flutter ([place]) -Chronic, continue meds and follow by cardiologist.   4. Chronic kidney disease (CKD), stage IV (severe) ([place]) -Followed by nephrologist.  -reviewed most recent labs   5. Rheumatoid arthritis involving multiple sites with positive rheumatoid factor ([place]) -Followed by rheumatology in [place].   The patient was encouraged to call or send a message through [place] for any questions or concerns.   Follow up: if symptoms persist or do not get better.   Side effects and appropriate use of all the medication(s) were discussed with the patient today. Patient advised to use the medication(s) as directed by their healthcare provider. The patient was encouraged to read, review, and understand all associated package inserts and contact our office with any questions or concerns. The patient accepts the risks of the treatment plan and had an opportunity to ask questions.   Staying healthy and adopting a healthy lifestyle for your overall health is important. You should eat 7 or more servings of fruits and vegetables per day. You should drink plenty of water to keep yourself hydrated and your kidneys healthy. This includes about 65-80+ fluid ounces of water. Limit your intake of animal  fats especially for elevated cholesterol. Avoid highly processed food and limit your salt intake if you have hypertension. Avoid foods high in saturated/Trans fats. Along with a healthy diet it is also very important to maintain time  for yourself to maintain a healthy mental health with low stress levels. You should get atleast 150 min of moderate intensity exercise weekly for a healthy heart. Along with eating right and exercising, aim for at least 7-9 hours of sleep daily.  Eat more whole grains which includes barley, wheat berries, oats, brown rice and whole wheat pasta. Use healthy plant oils which include olive, soy, corn, sunflower and peanut. Limit your caffeine and sugary drinks. Limit your intake of fast foods. Limit milk and dairy products to one or two daily servings.   Patient was given opportunity to ask questions. Patient verbalized understanding of the plan and was able to repeat key elements of the plan. All questions were answered to their satisfaction.  Raman Linas Stepter, DNP   I, Raman Thomasine Klutts have reviewed all documentation for this visit. The documentation on 02/13/21 for the exam, diagnosis, procedures, and orders are all accurate and complete.     THE PATIENT IS ENCOURAGED TO PRACTICE SOCIAL DISTANCING DUE TO THE COVID-19 PANDEMIC.

## 2021-02-13 NOTE — Patient Instructions (Signed)
Health Maintenance, Female Adopting a healthy lifestyle and getting preventive care are important in promoting health and wellness. Ask your health care provider about: The right schedule for you to have regular tests and exams. Things you can do on your own to prevent diseases and keep yourself healthy. What should I know about diet, weight, and exercise? Eat a healthy diet  Eat a diet that includes plenty of vegetables, fruits, low-fat dairy products, and lean protein. Do not eat a lot of foods that are high in solid fats, added sugars, or sodium.  Maintain a healthy weight Body mass index (BMI) is used to identify weight problems. It estimates body fat based on height and weight. Your health care provider can help determineyour BMI and help you achieve or maintain a healthy weight. Get regular exercise Get regular exercise. This is one of the most important things you can do for your health. Most adults should: Exercise for at least 150 minutes each week. The exercise should increase your heart rate and make you sweat (moderate-intensity exercise). Do strengthening exercises at least twice a week. This is in addition to the moderate-intensity exercise. Spend less time sitting. Even light physical activity can be beneficial. Watch cholesterol and blood lipids Have your blood tested for lipids and cholesterol at 75 years of age, then havethis test every 5 years. Have your cholesterol levels checked more often if: Your lipid or cholesterol levels are high. You are older than 75 years of age. You are at high risk for heart disease. What should I know about cancer screening? Depending on your health history and family history, you may need to have cancer screening at various ages. This may include screening for: Breast cancer. Cervical cancer. Colorectal cancer. Skin cancer. Lung cancer. What should I know about heart disease, diabetes, and high blood pressure? Blood pressure and heart  disease High blood pressure causes heart disease and increases the risk of stroke. This is more likely to develop in people who have high blood pressure readings, are of African descent, or are overweight. Have your blood pressure checked: Every 3-5 years if you are 18-39 years of age. Every year if you are 40 years old or older. Diabetes Have regular diabetes screenings. This checks your fasting blood sugar level. Have the screening done: Once every three years after age 40 if you are at a normal weight and have a low risk for diabetes. More often and at a younger age if you are overweight or have a high risk for diabetes. What should I know about preventing infection? Hepatitis B If you have a higher risk for hepatitis B, you should be screened for this virus. Talk with your health care provider to find out if you are at risk forhepatitis B infection. Hepatitis C Testing is recommended for: Everyone born from 1945 through 1965. Anyone with known risk factors for hepatitis C. Sexually transmitted infections (STIs) Get screened for STIs, including gonorrhea and chlamydia, if: You are sexually active and are younger than 75 years of age. You are older than 75 years of age and your health care provider tells you that you are at risk for this type of infection. Your sexual activity has changed since you were last screened, and you are at increased risk for chlamydia or gonorrhea. Ask your health care provider if you are at risk. Ask your health care provider about whether you are at high risk for HIV. Your health care provider may recommend a prescription medicine to help   prevent HIV infection. If you choose to take medicine to prevent HIV, you should first get tested for HIV. You should then be tested every 3 months for as long as you are taking the medicine. Pregnancy If you are about to stop having your period (premenopausal) and you may become pregnant, seek counseling before you get  pregnant. Take 400 to 800 micrograms (mcg) of folic acid every day if you become pregnant. Ask for birth control (contraception) if you want to prevent pregnancy. Osteoporosis and menopause Osteoporosis is a disease in which the bones lose minerals and strength with aging. This can result in bone fractures. If you are 65 years old or older, or if you are at risk for osteoporosis and fractures, ask your health care provider if you should: Be screened for bone loss. Take a calcium or vitamin D supplement to lower your risk of fractures. Be given hormone replacement therapy (HRT) to treat symptoms of menopause. Follow these instructions at home: Lifestyle Do not use any products that contain nicotine or tobacco, such as cigarettes, e-cigarettes, and chewing tobacco. If you need help quitting, ask your health care provider. Do not use street drugs. Do not share needles. Ask your health care provider for help if you need support or information about quitting drugs. Alcohol use Do not drink alcohol if: Your health care provider tells you not to drink. You are pregnant, may be pregnant, or are planning to become pregnant. If you drink alcohol: Limit how much you use to 0-1 drink a day. Limit intake if you are breastfeeding. Be aware of how much alcohol is in your drink. In the [place] one drink equals one 12 oz bottle of beer (355 mL), one 5 oz glass of wine (148 mL), or one 1 oz glass of hard liquor (44 mL). General instructions Schedule regular health, dental, and eye exams. Stay current with your vaccines. Tell your health care provider if: You often feel depressed. You have ever been abused or do not feel safe at home. Summary Adopting a healthy lifestyle and getting preventive care are important in promoting health and wellness. Follow your health care provider's instructions about healthy diet, exercising, and getting tested or screened for diseases. Follow your health care provider's  instructions on monitoring your cholesterol and blood pressure. This information is not intended to replace advice given to you by your health care provider. Make sure you discuss any questions you have with your healthcare provider. Document Revised: 07/20/2018 Document Reviewed: 07/20/2018 Elsevier Patient Education  2022 [employer]  

## 2021-02-14 LAB — LIPID PANEL
Chol/HDL Ratio: 2 ratio (ref 0.0–4.4)
Cholesterol, Total: 193 mg/dL (ref 100–199)
HDL: 96 mg/dL (ref >39)
LDL Chol Calc (NIH): 72 mg/dL (ref 0–99)
Triglycerides: 152 mg/dL — ABNORMAL HIGH (ref 0–149)
VLDL Cholesterol Cal: 25 mg/dL (ref 5–40)

## 2021-02-14 LAB — HEMOGLOBIN A1C
Est. average glucose Bld gHb Est-mCnc: 134 mg/dL
Hgb A1c MFr Bld: 6.3 % — ABNORMAL HIGH (ref 4.8–5.6)

## 2021-02-14 LAB — CBC
Hematocrit: 35.5 % (ref 34.0–46.6)
Hemoglobin: 11.8 g/dL (ref 11.1–15.9)
MCH: 28.9 pg (ref 26.6–33.0)
MCHC: 33.2 g/dL (ref 31.5–35.7)
MCV: 87 fL (ref 79–97)
Platelets: 264 10*3/uL (ref 150–450)
RBC: 4.09 x10E6/uL (ref 3.77–5.28)
RDW: 13.9 % (ref 11.7–15.4)
WBC: 8.4 10*3/uL (ref 3.4–10.8)

## 2021-02-14 LAB — HEPATITIS C ANTIBODY: Hep C Virus Ab: 0.2 s/co ratio (ref 0.0–0.9)

## 2021-02-18 LAB — MULTIPLE MYELOMA PANEL, SERUM
Albumin SerPl Elph-Mcnc: 3.6 g/dL (ref 2.9–4.4)
Albumin/Glob SerPl: 1.1 (ref 0.7–1.7)
Alpha 1: 0.2 g/dL (ref 0.0–0.4)
Alpha2 Glob SerPl Elph-Mcnc: 0.8 g/dL (ref 0.4–1.0)
B-Globulin SerPl Elph-Mcnc: 1 g/dL (ref 0.7–1.3)
Gamma Glob SerPl Elph-Mcnc: 1.3 g/dL (ref 0.4–1.8)
Globulin, Total: 3.3 g/dL (ref 2.2–3.9)
IgA: 289 mg/dL (ref 64–422)
IgG (Immunoglobin G), Serum: 1382 mg/dL (ref 586–1602)
IgM (Immunoglobulin M), Srm: 102 mg/dL (ref 26–217)
M Protein SerPl Elph-Mcnc: 0.6 g/dL — ABNORMAL HIGH
Total Protein ELP: 6.9 g/dL (ref 6.0–8.5)

## 2021-02-20 ENCOUNTER — Telehealth (HOSPITAL_COMMUNITY): Payer: Self-pay | Admitting: Cardiology

## 2021-02-20 DIAGNOSIS — E8581 Light chain (AL) amyloidosis: Secondary | ICD-10-CM

## 2021-02-20 NOTE — Telephone Encounter (Signed)
Patient called.  Patient aware, patient would appreciate call from providerwith further details. Orders placed

## 2021-02-21 ENCOUNTER — Telehealth (HOSPITAL_COMMUNITY): Payer: Self-pay | Admitting: Family Medicine

## 2021-02-21 NOTE — Telephone Encounter (Signed)
Spoke with Ms. Safi to further explain our referral to Heme/Onc (concern for ? AL amyloidosis) and rationale for requesting cMRI (evidence for cardiac amyloidosis). She expressed understanding and was appreciative for call.  Allena Katz, [name]-BC

## 2021-02-26 ENCOUNTER — Other Ambulatory Visit: Payer: Self-pay

## 2021-02-26 ENCOUNTER — Inpatient Hospital Stay: Payer: Medicare PPO

## 2021-02-26 ENCOUNTER — Inpatient Hospital Stay: Payer: Medicare PPO | Attending: Oncology | Admitting: Oncology

## 2021-02-26 VITALS — BP 91/71 | HR 79 | Temp 98.2°F | Wt 107.0 lb

## 2021-02-26 DIAGNOSIS — I420 Dilated cardiomyopathy: Secondary | ICD-10-CM

## 2021-02-26 DIAGNOSIS — I509 Heart failure, unspecified: Secondary | ICD-10-CM | POA: Diagnosis not present

## 2021-02-26 DIAGNOSIS — Z809 Family history of malignant neoplasm, unspecified: Secondary | ICD-10-CM | POA: Insufficient documentation

## 2021-02-26 DIAGNOSIS — I5020 Unspecified systolic (congestive) heart failure: Secondary | ICD-10-CM

## 2021-02-26 DIAGNOSIS — R5382 Chronic fatigue, unspecified: Secondary | ICD-10-CM

## 2021-02-26 DIAGNOSIS — M810 Age-related osteoporosis without current pathological fracture: Secondary | ICD-10-CM | POA: Diagnosis not present

## 2021-02-26 DIAGNOSIS — R531 Weakness: Secondary | ICD-10-CM | POA: Diagnosis not present

## 2021-02-26 DIAGNOSIS — M069 Rheumatoid arthritis, unspecified: Secondary | ICD-10-CM | POA: Diagnosis not present

## 2021-02-26 LAB — CBC WITH DIFFERENTIAL/PLATELET
Abs Immature Granulocytes: 0.05 10*3/uL (ref 0.00–0.07)
Basophils Absolute: 0.1 10*3/uL (ref 0.0–0.1)
Basophils Relative: 1 %
Eosinophils Absolute: 0.1 10*3/uL (ref 0.0–0.5)
Eosinophils Relative: 1 %
HCT: 37.3 % (ref 36.0–46.0)
Hemoglobin: 11.7 g/dL — ABNORMAL LOW (ref 12.0–15.0)
Immature Granulocytes: 1 %
Lymphocytes Relative: 20 %
Lymphs Abs: 1.6 10*3/uL (ref 0.7–4.0)
MCH: 28.3 pg (ref 26.0–34.0)
MCHC: 31.4 g/dL (ref 30.0–36.0)
MCV: 90.3 fL (ref 80.0–100.0)
Monocytes Absolute: 0.8 10*3/uL (ref 0.1–1.0)
Monocytes Relative: 10 %
Neutro Abs: 5.7 10*3/uL (ref 1.7–7.7)
Neutrophils Relative %: 67 %
Platelets: 262 10*3/uL (ref 150–400)
RBC: 4.13 MIL/uL (ref 3.87–5.11)
RDW: 15.9 % — ABNORMAL HIGH (ref 11.5–15.5)
WBC: 8.3 10*3/uL (ref 4.0–10.5)
nRBC: 0 % (ref 0.0–0.2)

## 2021-02-26 LAB — COMPREHENSIVE METABOLIC PANEL
ALT: 21 U/L (ref 0–44)
AST: 26 U/L (ref 15–41)
Albumin: 3.5 g/dL (ref 3.5–5.0)
Alkaline Phosphatase: 83 U/L (ref 38–126)
Anion gap: 11 (ref 5–15)
BUN: 54 mg/dL — ABNORMAL HIGH (ref 8–23)
CO2: 27 mmol/L (ref 22–32)
Calcium: 8.7 mg/dL — ABNORMAL LOW (ref 8.9–10.3)
Chloride: 99 mmol/L (ref 98–111)
Creatinine, Ser: 2.08 mg/dL — ABNORMAL HIGH (ref 0.44–1.00)
GFR, Estimated: 24 mL/min — ABNORMAL LOW (ref >60)
Glucose, Bld: 83 mg/dL (ref 70–99)
Potassium: 3.6 mmol/L (ref 3.5–5.1)
Sodium: 137 mmol/L (ref 135–145)
Total Bilirubin: 0.5 mg/dL (ref 0.3–1.2)
Total Protein: 7 g/dL (ref 6.5–8.1)

## 2021-02-26 NOTE — Progress Notes (Signed)
[place]  [phone]) [phone] Fax:(336) [phone]  ID: Tracy Nelson OB: Apr 13, 1946  MR#: [id number]  KZS#:[id number]  Patient Care Team: Glendale Chard, MD as PCP - General (Internal Medicine) Jorge Ny, LCSW as Social Worker (Licensed Clinical Social Worker)  CHIEF COMPLAINT: Concern for possible cardiac amyloidosis, Myeloma workup  INTERVAL HISTORY:  Tracy Nelson is a 74 year old female with PMH significant for CHF, dilated cardiomyopathy, HFrEF, osteoporosis, RA, referred by cardiology for consideration of possible cardiac amyloidosis. Tracy Nelson is a poor historian.   Tracy Nelson has known nonischemic cardiomyopathy which was diagnosed in 2015, cath in 2015 showed no coronary disease and cardiac MRI in 2015 showed no LGE.  Cause is uncertain, familial cardiomyopathy is a concern given nonischemic cardiomyopathy in her mother.    Worsening cardiac disease over the past 1.5 years with multiple hospital admissions, and concern for other causes of heart failure including cardiac amyloidosis, transthyretin cardiac amyloidosis (ATTR amyloidosis), or light chain amyloidosis (AL amyloidosis). Referral sent from [facility] for further workup.   Tracy Nelson remains quite limited in ADL's, primarily by fatigue, as well as a poor appetite.  Low BP and elevated creatinine have limited her cardiac med regimen. Most recent echo in 12/21 showed EF < 20% with normal RV systolic function.   Today Tracy Nelson was unclear for the reason of her visit. Tracy Nelson was concerned about coming to a cancer center, and why Tracy Nelson needed to come here for a "heart problem."  Tracy Nelson has chronic fatigue and weakness.  Tracy Nelson has muscle and joint pain especially in her back due to arthritis.  Tracy Nelson takes Tylenol which sometimes helps.  REVIEW OF SYSTEMS:   Review of Systems  Constitutional:  Positive for malaise/fatigue. Negative for fever and weight loss.  HENT:  Negative for congestion and hearing loss.   Eyes:   Negative for blurred vision and double vision.  Respiratory:  Positive for shortness of breath. Negative for cough.        Not worsening from baseline.   Cardiovascular:  Positive for palpitations and orthopnea. Negative for chest pain and leg swelling.  Gastrointestinal:  Negative for abdominal pain, constipation, diarrhea, nausea and vomiting.  Genitourinary:  Negative for frequency and urgency.  Skin:  Negative for rash.  Neurological:  Positive for weakness. Negative for dizziness, tingling and headaches.  Endo/Heme/Allergies:  Does not bruise/bleed easily.  Psychiatric/Behavioral:  Positive for depression. The patient is not nervous/anxious and does not have insomnia.    As per HPI. Otherwise, a complete review of systems is negative.  PAST MEDICAL HISTORY: Past Medical History:  Diagnosis Date   Acid reflux 07/15/2020   Arthritis 07/15/2020   CHF (congestive heart failure) ([place])    Colitis 07/15/2020   Dilated cardiomyopathy ([place]) 07/15/2020   HFrEF (heart failure with reduced ejection fraction) ([place]) 07/15/2020   HTN (hypertension) 07/15/2020   Osteoporosis 07/15/2020   Palpitations 07/15/2020   Rheumatoid arthritis ([place]) 07/15/2020    PAST SURGICAL HISTORY: Past Surgical History:  Procedure Laterality Date   CARDIOVERSION N/A 11/08/2020   Procedure: CARDIOVERSION;  Surgeon: [name], MD;  Location: [facility];  Service: Cardiovascular;  Laterality: N/A;   RIGHT HEART CATH N/A 08/14/2020   Procedure: RIGHT HEART CATH;  Surgeon: [name], MD;  Location: [place] CV LAB;  Service: Cardiovascular;  Laterality: N/A;   TEE WITHOUT CARDIOVERSION N/A 11/08/2020   Procedure: TRANSESOPHAGEAL ECHOCARDIOGRAM (TEE);  Surgeon: [name], MD;  Location: [facility] ENDOSCOPY;  Service: Cardiovascular;  Laterality: N/A;    FAMILY HISTORY: Family History  Problem Relation Age of Onset   Hypertension Mother    Diabetes Mother    Hypertension Father    Diabetes Father    Cancer  Brother    Arthritis Maternal Grandmother    Lung disease Paternal Grandfather     ADVANCED DIRECTIVES (Y/N):  N  HEALTH MAINTENANCE: Social History   Tobacco Use   Smoking status: Never   Smokeless tobacco: Never  Vaping Use   Vaping Use: Never used  Substance Use Topics   Alcohol use: Never   Drug use: Never    Allergies  Allergen Reactions   Baclofen Nausea And Vomiting   Penicillins Rash    Current Outpatient Medications  Medication Sig Dispense Refill   acetaminophen (TYLENOL) 500 MG tablet Take 500-1,000 mg by mouth every 6 (six) hours as needed for moderate pain.     amiodarone (PACERONE) 200 MG tablet Take 1 tablet (200 mg total) by mouth daily. 60 tablet 6   apixaban (ELIQUIS) 2.5 MG TABS tablet Take 1 tablet (2.5 mg total) by mouth 2 (two) times daily. 60 tablet 6   bumetanide (BUMEX) 1 MG tablet Take 2 mg by mouth in the morning. 2 MG IN AM/1 MG IN PM     bumetanide (BUMEX) 1 MG tablet Take 1 mg by mouth every evening. 2 MG am/1 MG PM     cholestyramine (QUESTRAN) 4 g packet Take 1 packet (4 g total) by mouth daily. 60 each 12   cycloSPORINE (RESTASIS) 0.05 % ophthalmic emulsion Place 1 drop into both eyes 2 (two) times daily. 1.5 mL 2   dapagliflozin propanediol (FARXIGA) 10 MG TABS tablet Take 1 tablet (10 mg total) by mouth daily before breakfast. 30 tablet 5   Ensure (ENSURE) Take 237 mLs by mouth 2 (two) times daily between meals.     folic acid (FOLVITE) 1 MG tablet TAKE 1 TABLET BY MOUTH EVERY DAY 30 tablet 3   hydroxychloroquine (PLAQUENIL) 200 MG tablet TAKE 1 TABLET BY MOUTH EVERY DAY 30 tablet 11   Menthol, Topical Analgesic, (ICY HOT BACK EX) Apply 1 application topically daily as needed (pain).     MITIGARE 0.6 MG CAPS Take 0.6 mg by mouth daily as needed (gout).     Multiple Vitamin (MULTIVITAMIN) tablet Take 1 tablet by mouth daily.     omeprazole (PRILOSEC) 40 MG capsule Take 40 mg by mouth daily.     polyethylene glycol (MIRALAX / GLYCOLAX) 17 g  packet Take 17 g by mouth as needed.     predniSONE (DELTASONE) 5 MG tablet TAKE 1 TABLET BY MOUTH EVERY DAY WITH BREAKFAST 30 tablet 3   Vericiguat (VERQUVO) 10 MG TABS Take 10 mg by mouth daily. 30 tablet 11   azaTHIOprine (IMURAN) 50 MG tablet Take 50 mg by mouth daily.     ergocalciferol (VITAMIN D2) 1.25 MG (50000 UT) capsule Take 1 capsule (50,000 Units total) by mouth once a week. (Patient not taking: Reported on 02/26/2021) 10 capsule 3   No current facility-administered medications for this visit.    OBJECTIVE: Vitals:   02/26/21 1435  BP: 91/71  Pulse: 79  Temp: 98.2 F (36.8 C)  SpO2: 100%     Body mass index is 19.32 kg/m.      ECOG FS:2 - Symptomatic, <50% confined to bed  General: Well-developed, well-nourished, no acute distress. Physical Exam Vitals and nursing note reviewed.  Constitutional:      Appearance:  Tracy Nelson is normal weight.  HENT:     Head: Normocephalic and atraumatic.     Nose: No congestion.     Mouth/Throat:     Mouth: Mucous membranes are moist.     Pharynx: Oropharynx is clear.  Eyes:     General:        Right eye: No discharge.        Left eye: No discharge.     Extraocular Movements: Extraocular movements intact.     Pupils: Pupils are equal, round, and reactive to light.  Cardiovascular:     Rate and Rhythm: Normal rate and regular rhythm.     Pulses: Normal pulses.     Heart sounds: No murmur heard. Pulmonary:     Effort: Pulmonary effort is normal.  Abdominal:     General: Bowel sounds are normal. There is no distension.     Tenderness: There is no abdominal tenderness. There is no guarding.  Musculoskeletal:        General: No swelling or deformity.     Right lower leg: Edema present.     Left lower leg: Edema present.  Skin:    General: Skin is warm and dry.     Capillary Refill: Capillary refill takes 2 to 3 seconds.  Neurological:     Mental Status: Tracy Nelson is alert and oriented to person, place, and time.  Psychiatric:         Attention and Perception: Tracy Nelson is inattentive.        Mood and Affect: Affect is flat.        Speech: Speech normal.        Behavior: Behavior is withdrawn.        Thought Content: Thought content normal.    LAB RESULTS:  Lab Results  Component Value Date   NA 142 02/12/2021   K 3.5 02/12/2021   CL 108 02/12/2021   CO2 23 02/12/2021   GLUCOSE 95 02/12/2021   BUN 37 (H) 02/12/2021   CREATININE 2.14 (H) 02/12/2021   CALCIUM 9.4 02/12/2021   PROT 7.1 11/12/2020   ALBUMIN 3.7 11/12/2020   AST 30 11/12/2020   ALT 23 11/12/2020   ALKPHOS 64 11/12/2020   BILITOT 0.8 11/12/2020   GFRNONAA 24 (L) 02/12/2021    Lab Results  Component Value Date   WBC 8.4 02/13/2021   NEUTROABS 10.6 (H) 08/29/2020   HGB 11.8 02/13/2021   HCT 35.5 02/13/2021   MCV 87 02/13/2021   PLT 264 02/13/2021     STUDIES: No results found.  ASSESSMENT/PLAN: Tracy Nelson is a 75 year old female who presents to hematology for continued work-up for possible cardiac amyloidosis.  Cardiac Amyloidosis-under work-up Cardiology has ordered cardiac MRI and should be scheduled soon. It appears that cardiology is completing work-up and is requesting additional labs to be ordered and interpreted by hematology. According to up-to-date, when cardiac amyloidosis is suspected they recommend 3 test for evidence of monoclonal protein are performed including serum kappa lambda free light chain ratio analysis, serum protein immunofixation and urine protein immunofixation. If monoclonal protein is identified by 1 or more of these tests referral to hematology is recommended and bone marrow biopsy is generally performed.  Additional tissue biopsy may be required such as fat pad aspirate or biopsy of other tissue.  If monoclonal protein is not identified by any of these test management is based upon the results of bone tracer cardiac scintigraphy.  Results should be available in about 10 to 14 days. We  will have her return to  clinic in approximately 2 to 3 weeks to discuss results.  I spent 35 minutes dedicated to the care of this patient (face-to-face and non-face-to-face) on the date of the encounter to include what is described in the assessment and plan.  Patient expressed understanding and was in agreement with this plan. Tracy Nelson also understands that Tracy Nelson can call clinic at any time with any questions, concerns, or complaints.   The patient's diagnosis, an outline of the further diagnostic and laboratory studies which will be required, the recommendation for surgery, and alternatives were discussed with her and her accompanying family members.  All questions were answered to their satisfaction.  I personally had a face to face interaction and evaluated the patient jointly with the NP Student, Tracy. Benedetto Goad.  I have reviewed her history and available records and have performed the key portions of the physical exam including general, HEENT, abdominal exam, pelvic exam with my findings confirming those documented above by the APP student.  I have discussed the case with the APP student and the patient.  I agree with the above documentation, assessment and plan which was fully formulated by me.  Counseling was completed by me.    Benedetto Goad, Student [name]   Faythe Casa, NP 03/06/2021 1:54 PM

## 2021-02-27 LAB — PROTEIN ELECTRO, RANDOM URINE
Albumin ELP, Urine: 100 %
Alpha-1-Globulin, U: 0 %
Alpha-2-Globulin, U: 0 %
Beta Globulin, U: 0 %
Gamma Globulin, U: 0 %
Total Protein, Urine: 4.7 mg/dL

## 2021-03-03 LAB — MULTIPLE MYELOMA PANEL, SERUM
Albumin SerPl Elph-Mcnc: 3.5 g/dL (ref 2.9–4.4)
Albumin/Glob SerPl: 1.2 (ref 0.7–1.7)
Alpha 1: 0.2 g/dL (ref 0.0–0.4)
Alpha2 Glob SerPl Elph-Mcnc: 0.7 g/dL (ref 0.4–1.0)
B-Globulin SerPl Elph-Mcnc: 1 g/dL (ref 0.7–1.3)
Gamma Glob SerPl Elph-Mcnc: 1.2 g/dL (ref 0.4–1.8)
Globulin, Total: 3 g/dL (ref 2.2–3.9)
IgA: 277 mg/dL (ref 64–422)
IgG (Immunoglobin G), Serum: 1296 mg/dL (ref 586–1602)
IgM (Immunoglobulin M), Srm: 97 mg/dL (ref 26–217)
M Protein SerPl Elph-Mcnc: 0.6 g/dL — ABNORMAL HIGH
Total Protein ELP: 6.5 g/dL (ref 6.0–8.5)

## 2021-03-04 LAB — PROTEIN ELECTROPHORESIS, SERUM
A/G Ratio: 0.9 (ref 0.7–1.7)
Albumin ELP: 3 g/dL (ref 2.9–4.4)
Alpha-1-Globulin: 0.2 g/dL (ref 0.0–0.4)
Alpha-2-Globulin: 0.8 g/dL (ref 0.4–1.0)
Beta Globulin: 1.2 g/dL (ref 0.7–1.3)
Gamma Globulin: 1.2 g/dL (ref 0.4–1.8)
Globulin, Total: 3.4 g/dL (ref 2.2–3.9)
M-Spike, %: 0.5 g/dL — ABNORMAL HIGH
Total Protein ELP: 6.4 g/dL (ref 6.0–8.5)

## 2021-03-10 ENCOUNTER — Inpatient Hospital Stay: Payer: Medicare PPO | Attending: Internal Medicine | Admitting: Internal Medicine

## 2021-03-10 ENCOUNTER — Other Ambulatory Visit: Payer: Self-pay

## 2021-03-10 ENCOUNTER — Encounter: Payer: Self-pay | Admitting: Internal Medicine

## 2021-03-10 DIAGNOSIS — Z79899 Other long term (current) drug therapy: Secondary | ICD-10-CM | POA: Diagnosis not present

## 2021-03-10 DIAGNOSIS — M810 Age-related osteoporosis without current pathological fracture: Secondary | ICD-10-CM | POA: Diagnosis not present

## 2021-03-10 DIAGNOSIS — Z7984 Long term (current) use of oral hypoglycemic drugs: Secondary | ICD-10-CM | POA: Diagnosis not present

## 2021-03-10 DIAGNOSIS — Z809 Family history of malignant neoplasm, unspecified: Secondary | ICD-10-CM | POA: Insufficient documentation

## 2021-03-10 DIAGNOSIS — Z7952 Long term (current) use of systemic steroids: Secondary | ICD-10-CM | POA: Insufficient documentation

## 2021-03-10 DIAGNOSIS — R200 Anesthesia of skin: Secondary | ICD-10-CM | POA: Diagnosis not present

## 2021-03-10 DIAGNOSIS — R5383 Other fatigue: Secondary | ICD-10-CM | POA: Diagnosis not present

## 2021-03-10 DIAGNOSIS — K219 Gastro-esophageal reflux disease without esophagitis: Secondary | ICD-10-CM | POA: Insufficient documentation

## 2021-03-10 DIAGNOSIS — I255 Ischemic cardiomyopathy: Secondary | ICD-10-CM | POA: Insufficient documentation

## 2021-03-10 DIAGNOSIS — R110 Nausea: Secondary | ICD-10-CM | POA: Diagnosis not present

## 2021-03-10 DIAGNOSIS — Z7901 Long term (current) use of anticoagulants: Secondary | ICD-10-CM | POA: Diagnosis not present

## 2021-03-10 DIAGNOSIS — M129 Arthropathy, unspecified: Secondary | ICD-10-CM | POA: Diagnosis not present

## 2021-03-10 DIAGNOSIS — I5022 Chronic systolic (congestive) heart failure: Secondary | ICD-10-CM | POA: Diagnosis not present

## 2021-03-10 DIAGNOSIS — I130 Hypertensive heart and chronic kidney disease with heart failure and stage 1 through stage 4 chronic kidney disease, or unspecified chronic kidney disease: Secondary | ICD-10-CM | POA: Diagnosis not present

## 2021-03-10 DIAGNOSIS — R634 Abnormal weight loss: Secondary | ICD-10-CM | POA: Diagnosis not present

## 2021-03-10 DIAGNOSIS — N184 Chronic kidney disease, stage 4 (severe): Secondary | ICD-10-CM | POA: Diagnosis not present

## 2021-03-10 DIAGNOSIS — R0602 Shortness of breath: Secondary | ICD-10-CM | POA: Diagnosis not present

## 2021-03-10 DIAGNOSIS — M069 Rheumatoid arthritis, unspecified: Secondary | ICD-10-CM | POA: Insufficient documentation

## 2021-03-10 DIAGNOSIS — D472 Monoclonal gammopathy: Secondary | ICD-10-CM | POA: Diagnosis not present

## 2021-03-10 NOTE — Progress Notes (Signed)
[place] NOTE  Patient Care Team: Glendale Chard, MD as PCP - General (Internal Medicine) Jorge Ny, LCSW as Social Worker (Licensed Clinical Social Worker)  CHIEF COMPLAINTS/PURPOSE OF CONSULTATION: Question cardiac amyloidosis  # July 2022-M protein 0.6 IgG lambda; urine-negative for M protein/proteinuria [rheumatology;Dr.Rice].  April 2022-cardiac PET eqivocal for equivocal for transthyretin amyloidosis.    # Nonischemic cardiomyopathy [Dr.McClean]- question etiology at [facility]-- ejection fraction less than 20%; LV dilatation /global hypokinesis; moderate MR]; on Eliquis/amiodarone  #CKD stage IV -question etiology [GFR ~24; Nephrology-?[place]]  #Rheumatoid arthritis-Plaquenil/azathioprine/prednisone [Dr. Benjamine Mola rheumatology]   Oncology History   No history exists.   HISTORY OF PRESENTING ILLNESS:  Tracy Nelson 75 y.o.  female with history of nonischemic cardiomyopathy of unclear etiology is here for follow-up/review blood work for monoclonal gammopathy/question amyloidosis.  Patient denies any swelling in the legs.  Denies any shortness of breath at rest.  She does endorse shortness of breath on exertion.  Chronic fatigue.  Positive weight loss.  Positive for nausea.  Mild numbness in the feet but no tingling.  She does bruise easily.   Review of Systems  Constitutional:  Positive for malaise/fatigue and weight loss. Negative for chills, diaphoresis and fever.  HENT:  Negative for nosebleeds and sore throat.   Eyes:  Negative for double vision.  Respiratory:  Positive for shortness of breath. Negative for cough, hemoptysis, sputum production and wheezing.   Cardiovascular:  Negative for chest pain, palpitations, orthopnea and leg swelling.  Gastrointestinal:  Positive for nausea. Negative for abdominal pain, blood in stool, constipation, diarrhea, heartburn, melena and vomiting.  Genitourinary:  Negative for dysuria, frequency and urgency.   Musculoskeletal:  Negative for back pain and joint pain.  Skin: Negative.  Negative for itching and rash.  Neurological:  Positive for sensory change. Negative for dizziness, tingling, focal weakness, weakness and headaches.  Endo/Heme/Allergies:  Bruises/bleeds easily.  Psychiatric/Behavioral:  Negative for depression. The patient is not nervous/anxious and does not have insomnia.     MEDICAL HISTORY:  Past Medical History:  Diagnosis Date   Acid reflux 07/15/2020   Arthritis 07/15/2020   CHF (congestive heart failure) ([place])    Colitis 07/15/2020   Dilated cardiomyopathy ([place]) 07/15/2020   HFrEF (heart failure with reduced ejection fraction) ([place]) 07/15/2020   HTN (hypertension) 07/15/2020   Osteoporosis 07/15/2020   Palpitations 07/15/2020   Rheumatoid arthritis ([place]) 07/15/2020    SURGICAL HISTORY: Past Surgical History:  Procedure Laterality Date   CARDIOVERSION N/A 11/08/2020   Procedure: CARDIOVERSION;  Surgeon: [name], MD;  Location: [place];  Service: Cardiovascular;  Laterality: N/A;   RIGHT HEART CATH N/A 08/14/2020   Procedure: RIGHT HEART CATH;  Surgeon: [name], MD;  Location: [place] CV LAB;  Service: Cardiovascular;  Laterality: N/A;   TEE WITHOUT CARDIOVERSION N/A 11/08/2020   Procedure: TRANSESOPHAGEAL ECHOCARDIOGRAM (TEE);  Surgeon: [name], MD;  Location: [facility] ENDOSCOPY;  Service: Cardiovascular;  Laterality: N/A;    SOCIAL HISTORY: Social History   Socioeconomic History   Marital status: Widowed    Spouse name: Not on file   Number of children: Not on file   Years of education: 16   Highest education level: Bachelor's degree (e.g., BA, AB, BS)  Occupational History   Occupation: Retired  Tobacco Use   Smoking status: Never   Smokeless tobacco: Never  Vaping Use   Vaping Use: Never used  Substance and Sexual Activity   Alcohol use: Never   Drug  use: Never   Sexual activity: Not Currently  Other Topics Concern   Not on file   Social History Narrative   Not on file   Social Determinants of Health   Financial Resource Strain: Low Risk    Difficulty of Paying Living Expenses: Not very hard  Food Insecurity: No Food Insecurity   Worried About Running Out of Food in the Last Year: Never true   Ran Out of Food in the Last Year: Never true  Transportation Needs: No Transportation Needs   Lack of Transportation (Medical): No   Lack of Transportation (Non-Medical): No  Physical Activity: Not on file  Stress: Not on file  Social Connections: Not on file  Intimate Partner Violence: Not on file    FAMILY HISTORY: Family History  Problem Relation Age of Onset   Hypertension Mother    Diabetes Mother    Hypertension Father    Diabetes Father    Cancer Brother    Arthritis Maternal Grandmother    Lung disease Paternal Grandfather     ALLERGIES:  is allergic to baclofen and penicillins.  MEDICATIONS:  Current Outpatient Medications  Medication Sig Dispense Refill   acetaminophen (TYLENOL) 500 MG tablet Take 500-1,000 mg by mouth every 6 (six) hours as needed for moderate pain.     amiodarone (PACERONE) 200 MG tablet Take 1 tablet (200 mg total) by mouth daily. 60 tablet 6   apixaban (ELIQUIS) 2.5 MG TABS tablet Take 1 tablet (2.5 mg total) by mouth 2 (two) times daily. 60 tablet 6   azaTHIOprine (IMURAN) 50 MG tablet Take 50 mg by mouth daily.     bumetanide (BUMEX) 1 MG tablet Take 2 mg by mouth in the morning. 2 MG IN AM/1 MG IN PM     bumetanide (BUMEX) 1 MG tablet Take 1 mg by mouth every evening. 2 MG am/1 MG PM     cholestyramine (QUESTRAN) 4 g packet Take 1 packet (4 g total) by mouth daily. 60 each 12   cycloSPORINE (RESTASIS) 0.05 % ophthalmic emulsion Place 1 drop into both eyes 2 (two) times daily. 1.5 mL 2   dapagliflozin propanediol (FARXIGA) 10 MG TABS tablet Take 1 tablet (10 mg total) by mouth daily before breakfast. 30 tablet 5   Ensure (ENSURE) Take 237 mLs by mouth 2 (two) times daily  between meals.     folic acid (FOLVITE) 1 MG tablet TAKE 1 TABLET BY MOUTH EVERY DAY 30 tablet 3   hydroxychloroquine (PLAQUENIL) 200 MG tablet TAKE 1 TABLET BY MOUTH EVERY DAY 30 tablet 11   Menthol, Topical Analgesic, (ICY HOT BACK EX) Apply 1 application topically daily as needed (pain).     MITIGARE 0.6 MG CAPS Take 0.6 mg by mouth daily as needed (gout).     Multiple Vitamin (MULTIVITAMIN) tablet Take 1 tablet by mouth daily.     omeprazole (PRILOSEC) 40 MG capsule Take 40 mg by mouth daily.     polyethylene glycol (MIRALAX / GLYCOLAX) 17 g packet Take 17 g by mouth as needed.     predniSONE (DELTASONE) 5 MG tablet TAKE 1 TABLET BY MOUTH EVERY DAY WITH BREAKFAST 30 tablet 3   Vericiguat (VERQUVO) 10 MG TABS Take 10 mg by mouth daily. 30 tablet 11   ergocalciferol (VITAMIN D2) 1.25 MG (50000 UT) capsule Take 1 capsule (50,000 Units total) by mouth once a week. (Patient not taking: No sig reported) 10 capsule 3   No current facility-administered medications for this visit.      Marland Kitchen  PHYSICAL EXAMINATION: ECOG PERFORMANCE STATUS: 2 - Symptomatic, <50% confined to bed  Vitals:   03/10/21 1510  BP: 93/72  Pulse: 87  Resp: 18  Temp: 98.1 F (36.7 C)  SpO2: 100%   Filed Weights   03/10/21 1510  Weight: 105 lb 3.2 oz (47.7 kg)    Physical Exam Vitals and nursing note reviewed.  Constitutional:      Comments: Thin built female patient; ambulating independently.  Accompanied by daughter.    HENT:     Head: Normocephalic and atraumatic.     Mouth/Throat:     Pharynx: Oropharynx is clear.  Eyes:     Extraocular Movements: Extraocular movements intact.     Pupils: Pupils are equal, round, and reactive to light.  Cardiovascular:     Rate and Rhythm: Normal rate and regular rhythm.  Pulmonary:     Comments: Decreased breath sounds bilaterally.  Abdominal:     Palpations: Abdomen is soft.  Musculoskeletal:        General: Normal range of motion.     Cervical back: Normal  range of motion.  Skin:    General: Skin is warm.  Neurological:     General: No focal deficit present.     Mental Status: She is alert and oriented to person, place, and time.  Psychiatric:        Behavior: Behavior normal.        Judgment: Judgment normal.     LABORATORY DATA:  I have reviewed the data as listed Lab Results  Component Value Date   WBC 8.3 02/26/2021   HGB 11.7 (L) 02/26/2021   HCT 37.3 02/26/2021   MCV 90.3 02/26/2021   PLT 262 02/26/2021   Recent Labs    10/19/20 0328 10/21/20 0852 11/12/20 1730 11/22/20 0852 12/17/20 1611 02/12/21 1531 02/26/21 1542  NA 130*   < > 136   < > 135 142 137  K 3.9   < > 4.2   < > 3.7 3.5 3.6  CL 96*   < > 101   < > 100 108 99  CO2 24   < > 24   < > '26 23 27  ' GLUCOSE 104*   < > 81   < > 99 95 83  BUN 73*   < > 32*   < > 46* 37* 54*  CREATININE 2.39*   < > 2.01*   < > 2.24* 2.14* 2.08*  CALCIUM 9.1   < > 9.6   < > 9.5 9.4 8.7*  GFRNONAA 21*   < > 25*   < > 22* 24* 24*  PROT 6.0*  --  7.1  --   --   --  7.0  ALBUMIN 2.9*  --  3.7  --   --   --  3.5  AST 27  --  30  --   --   --  26  ALT 13  --  23  --   --   --  21  ALKPHOS 55  --  64  --   --   --  83  BILITOT 0.7  --  0.8  --   --   --  0.5  BILIDIR 0.2  --   --   --   --   --   --   IBILI 0.5  --   --   --   --   --   --    < > = values in  this interval not displayed.    RADIOGRAPHIC STUDIES: I have personally reviewed the radiological images as listed and agreed with the findings in the report. No results found.  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   April 2022-cardiac PET scan- equivocal for transthyretin amyloidosis.  Negative for orthostatics.  however cardiac MRI is ordered/pending.  #I had a long discussion with the patient and daughter given the M protein the blood-I think is  reasonable to work-up for cardiac amyloidosis in the absence of any obvious cause of cardiomyopathy.  Also discussed that it is quite possible that monoclonal myopathy-could be MGUS; and patient's cardiac etiology could be something totally different.    However cardiac MRI will be very helpful.  If the cardiac MRI is concerning-then I would recommend a bone marrow biopsy discussed the bone marrow biopsy procedure/sedation with radiology.  Hold off ordering-await cardiac MRI.  #NICMP/ CHF-ejection fraction 20%/global LV dysfunction- - [Dr.Mclean]-clinically compensated-on diuretics/amiodarone Eliquis/Farxiga.  Elevated proBNP.  Will discuss with cardiology.  # CKD- stage IV [nephrology]-no proteinuria noted; the etiology of CKD is unclear.  We will need to discuss with nephrology.  # Rheuamtoid- on imuran,, HCX; prednsione 40m  [Dr.Rice]  # DISPOSITION: # follow up TBD- Dr.B  All questions were answered. The patient knows to call the clinic with any problems, questions or concerns.    GCammie Sickle MD 03/10/2021 11:11 PM

## 2021-03-10 NOTE — Assessment & Plan Note (Addendum)
#  Monoclonal gammopathy-[July 2022 -IgG lambda light chain 0.6 g,/DL; urine M protein-negative] the context of the nonischemic cardiomyopathy/question cardiac amyloidosis.   April 2022-cardiac PET scan- equivocal for transthyretin amyloidosis.  Negative for orthostatics.  however cardiac MRI is ordered/pending.  #I had a long discussion with the patient and daughter given the M protein the blood-I think is reasonable to work-up for cardiac amyloidosis in the absence of any obvious cause of cardiomyopathy.  Also discussed that it is quite possible that monoclonal myopathy-could be MGUS; and patient's cardiac etiology could be something totally different.    However cardiac MRI will be very helpful.  If the cardiac MRI is concerning-then I would recommend a bone marrow biopsy discussed the bone marrow biopsy procedure/sedation with radiology.  Hold off ordering-await cardiac MRI.  #NICMP/ CHF-ejection fraction 20%/global LV dysfunction- - [Dr.Mclean]-clinically compensated-on diuretics/amiodarone Eliquis/Farxiga.  Elevated proBNP.  Will discuss with cardiology.  # CKD- stage IV [nephrology]-no proteinuria noted; the etiology of CKD is unclear.  We will need to discuss with nephrology.  # Rheuamtoid- on imuran,, HCX; prednsione $RemoveBeforeD'5mg'[id number]$   [Dr.Rice]  # DISPOSITION: # follow up TBD- Dr.B

## 2021-03-12 ENCOUNTER — Ambulatory Visit: Payer: Medicare PPO | Admitting: Internal Medicine

## 2021-03-12 ENCOUNTER — Telehealth (HOSPITAL_COMMUNITY): Payer: Self-pay | Admitting: *Deleted

## 2021-03-12 NOTE — Progress Notes (Deleted)
Office Visit Note  Patient: Tracy Nelson             Date of Birth: 1946-05-07           MRN: [id number]             PCP: Glendale Chard, MD Referring: No ref. provider found Visit Date: 03/12/2021   Subjective:  No chief complaint on file.   History of Present Illness: Tracy Nelson is a 75 y.o. female here for follow up ***     No Rheumatology ROS completed.   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/10/2021   Hypotension 10/22/2020   Atypical chest pain 10/22/2020   CKD (chronic kidney disease), stage III ([place]) 10/22/2020   Nausea & vomiting 10/22/2020   Physical deconditioning 10/22/2020   Atrial flutter ([place]) 10/18/2020   Arthritis 07/15/2020   GERD (gastroesophageal reflux disease) 07/15/2020   Colitis 07/15/2020   Non-ischemic cardiomyopathy ([place]) 07/15/2020   HFrEF (heart failure with reduced ejection fraction) ([place]) 07/15/2020   HTN (hypertension) 07/15/2020   Osteoporosis 07/15/2020   Palpitations 07/15/2020   Acute gout of left shoulder 07/10/2020   Rheumatoid arthritis involving multiple sites with positive rheumatoid factor ([place]) 10/28/2015   Mitral regurgitation 05/30/2014   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   HFrEF (heart failure with reduced ejection fraction) ([place]) 07/15/2020   HTN (hypertension) 07/15/2020   Osteoporosis 07/15/2020   Palpitations 07/15/2020   Rheumatoid arthritis ([place]) 07/15/2020    Family History  Problem Relation Age of Onset   Hypertension Mother    Diabetes Mother     Hypertension Father    Diabetes Father    Cancer Brother    Arthritis Maternal Grandmother    Lung disease Paternal Grandfather    Past Surgical History:  Procedure Laterality Date   CARDIOVERSION N/A 11/08/2020   Procedure: CARDIOVERSION;  Surgeon: [name], MD;  Location: [facility];  Service: Cardiovascular;  Laterality: N/A;   RIGHT HEART CATH N/A 08/14/2020   Procedure: RIGHT HEART CATH;  Surgeon: [name], MD;  Location: [facility];  Service: Cardiovascular;  Laterality: N/A;   TEE WITHOUT CARDIOVERSION N/A 11/08/2020   Procedure: TRANSESOPHAGEAL ECHOCARDIOGRAM (TEE);  Surgeon: [name], MD;  Location: [facility];  Service: Cardiovascular;  Laterality: N/A;   Social History   Social History Narrative   Not on file    There is no immunization history on file for this patient.   Objective: Vital Signs: There were no vitals taken for this visit.   Physical Exam   Musculoskeletal Exam: ***  CDAI Exam: CDAI Score: -- Patient Global: --; Provider Global: -- Swollen: --; Tender: -- Joint Exam 03/12/2021   No joint exam has been documented for this visit   There is currently no information documented on the homunculus. Go to the Rheumatology activity and complete the homunculus joint exam.  Investigation: No additional findings.  Imaging: No results found.  Recent Labs: Lab Results  Component Value Date   WBC 8.3 02/26/2021   HGB 11.7 (L)  02/26/2021   PLT 262 02/26/2021   NA 137 02/26/2021   K 3.6 02/26/2021   CL 99 02/26/2021   CO2 27 02/26/2021   GLUCOSE 83 02/26/2021   BUN 54 (H) 02/26/2021   CREATININE 2.08 (H) 02/26/2021   BILITOT 0.5 02/26/2021   ALKPHOS 83 02/26/2021   AST 26 02/26/2021   ALT 21 02/26/2021   PROT 7.0 02/26/2021   ALBUMIN 3.5 02/26/2021   CALCIUM 8.7 (L) 02/26/2021    Speciality Comments: No specialty comments available.  Procedures:  No procedures performed Allergies: Baclofen and Penicillins    Assessment / Plan:     Visit Diagnoses: No diagnosis found.  ***  Orders: No orders of the defined types were placed in this encounter.  No orders of the defined types were placed in this encounter.    Follow-Up Instructions: No follow-ups on file.   [name], MD  Note - This record has been created using [employer].  Chart creation errors have been sought, but may not always  have been located. Such creation errors do not reflect on  the standard of medical care.

## 2021-03-12 NOTE — Telephone Encounter (Signed)
Pts insurance does not cover CMRI for [facility]. Please see below.

## 2021-03-13 ENCOUNTER — Telehealth: Payer: Self-pay | Admitting: Internal Medicine

## 2021-03-13 ENCOUNTER — Telehealth (HOSPITAL_COMMUNITY): Payer: Self-pay | Admitting: *Deleted

## 2021-03-13 NOTE — Telephone Encounter (Signed)
On 8/03-discussed with Dr. Aundra Dubin cardiology-given the chronicity of the patient's congestive heart failure/lack of proteinuria-it is unlikely patient has cardiac AL amyloidosis.  Await MRI of the heart as ordered  On 8/05-I spoke to patient regarding my above impression; however will await above work-up  Given M protein elevation-this potentially could be followed up again in 6 months to 1 year however this has to be weighed in the context of her overall prognosis from her congestive heart failure.GB

## 2021-03-13 NOTE — Telephone Encounter (Signed)
[employer] approved CMRI   Auth #[id number]  exp. 04/16/21  msg sent to julia piercy to schedule 03/13/21

## 2021-04-02 ENCOUNTER — Other Ambulatory Visit (HOSPITAL_COMMUNITY): Payer: Self-pay | Admitting: Cardiology

## 2021-04-10 ENCOUNTER — Ambulatory Visit (INDEPENDENT_AMBULATORY_CARE_PROVIDER_SITE_OTHER): Payer: Medicare PPO

## 2021-04-10 VITALS — Ht 64.0 in | Wt 105.0 lb

## 2021-04-10 DIAGNOSIS — Z1231 Encounter for screening mammogram for malignant neoplasm of breast: Secondary | ICD-10-CM | POA: Diagnosis not present

## 2021-04-10 DIAGNOSIS — Z0000 Encounter for general adult medical examination without abnormal findings: Secondary | ICD-10-CM

## 2021-04-10 NOTE — Patient Instructions (Signed)
[name] , Thank you for taking time to come for your Medicare Wellness Visit. I appreciate your ongoing commitment to your health goals. Please review the following plan we discussed and let me know if I can assist you in the future.   Screening recommendations/referrals: Colonoscopy: not required Mammogram: ordered Bone Density: states had Recommended yearly ophthalmology/optometry visit for glaucoma screening and checkup Recommended yearly dental visit for hygiene and checkup  Vaccinations: Influenza vaccine: decline Pneumococcal vaccine: decline Tdap vaccine: decline Shingles vaccine: decline   Covid-19:decline  Advanced directives: Please bring a copy of your POA (Power of Attorney) and/or Living Will to your next appointment.   Conditions/risks identified: none  Next appointment: Follow up in one year for your annual wellness visit    Preventive Care 65 Years and Older, Female Preventive care refers to lifestyle choices and visits with your health care provider that can promote health and wellness. What does preventive care include? A yearly physical exam. This is also called an annual well check. Dental exams once or twice a year. Routine eye exams. Ask your health care provider how often you should have your eyes checked. Personal lifestyle choices, including: Daily care of your teeth and gums. Regular physical activity. Eating a healthy diet. Avoiding tobacco and drug use. Limiting alcohol use. Practicing safe sex. Taking low-dose aspirin every day. Taking vitamin and mineral supplements as recommended by your health care provider. What happens during an annual well check? The services and screenings done by your health care provider during your annual well check will depend on your age, overall health, lifestyle risk factors, and family history of disease. Counseling  Your health care provider may ask you questions about your: Alcohol use. Tobacco use. Drug  use. Emotional well-being. Home and relationship well-being. Sexual activity. Eating habits. History of falls. Memory and ability to understand (cognition). Work and work Statistician. Reproductive health. Screening  You may have the following tests or measurements: Height, weight, and BMI. Blood pressure. Lipid and cholesterol levels. These may be checked every 5 years, or more frequently if you are over 77 years old. Skin check. Lung cancer screening. You may have this screening every year starting at age 18 if you have a 30-pack-year history of smoking and currently smoke or have quit within the past 15 years. Fecal occult blood test (FOBT) of the stool. You may have this test every year starting at age 52. Flexible sigmoidoscopy or colonoscopy. You may have a sigmoidoscopy every 5 years or a colonoscopy every 10 years starting at age 9. Hepatitis C blood test. Hepatitis B blood test. Sexually transmitted disease (STD) testing. Diabetes screening. This is done by checking your blood sugar (glucose) after you have not eaten for a while (fasting). You may have this done every 1-3 years. Bone density scan. This is done to screen for osteoporosis. You may have this done starting at age 68. Mammogram. This may be done every 1-2 years. Talk to your health care provider about how often you should have regular mammograms. Talk with your health care provider about your test results, treatment options, and if necessary, the need for more tests. Vaccines  Your health care provider may recommend certain vaccines, such as: Influenza vaccine. This is recommended every year. Tetanus, diphtheria, and acellular pertussis (Tdap, Td) vaccine. You may need a Td booster every 10 years. Zoster vaccine. You may need this after age 53. Pneumococcal 13-valent conjugate (PCV13) vaccine. One dose is recommended after age 35. Pneumococcal polysaccharide (PPSV23) vaccine.  One dose is recommended after age  9. Talk to your health care provider about which screenings and vaccines you need and how often you need them. This information is not intended to replace advice given to you by your health care provider. Make sure you discuss any questions you have with your health care provider. Document Released: 08/23/2015 Document Revised: 04/15/2016 Document Reviewed: 05/28/2015 Elsevier Interactive Patient Education  2017 [place] Prevention in the Home Falls can cause injuries. They can happen to people of all ages. There are many things you can do to make your home safe and to help prevent falls. What can I do on the outside of my home? Regularly fix the edges of walkways and driveways and fix any cracks. Remove anything that might make you trip as you walk through a door, such as a raised step or threshold. Trim any bushes or trees on the path to your home. Use bright outdoor lighting. Clear any walking paths of anything that might make someone trip, such as rocks or tools. Regularly check to see if handrails are loose or broken. Make sure that both sides of any steps have handrails. Any raised decks and porches should have guardrails on the edges. Have any leaves, snow, or ice cleared regularly. Use sand or salt on walking paths during winter. Clean up any spills in your garage right away. This includes oil or grease spills. What can I do in the bathroom? Use night lights. Install grab bars by the toilet and in the tub and shower. Do not use towel bars as grab bars. Use non-skid mats or decals in the tub or shower. If you need to sit down in the shower, use a plastic, non-slip stool. Keep the floor dry. Clean up any water that spills on the floor as soon as it happens. Remove soap buildup in the tub or shower regularly. Attach bath mats securely with double-sided non-slip rug tape. Do not have throw rugs and other things on the floor that can make you trip. What can I do in the  bedroom? Use night lights. Make sure that you have a light by your bed that is easy to reach. Do not use any sheets or blankets that are too big for your bed. They should not hang down onto the floor. Have a firm chair that has side arms. You can use this for support while you get dressed. Do not have throw rugs and other things on the floor that can make you trip. What can I do in the kitchen? Clean up any spills right away. Avoid walking on wet floors. Keep items that you use a lot in easy-to-reach places. If you need to reach something above you, use a strong step stool that has a grab bar. Keep electrical cords out of the way. Do not use floor polish or wax that makes floors slippery. If you must use wax, use non-skid floor wax. Do not have throw rugs and other things on the floor that can make you trip. What can I do with my stairs? Do not leave any items on the stairs. Make sure that there are handrails on both sides of the stairs and use them. Fix handrails that are broken or loose. Make sure that handrails are as long as the stairways. Check any carpeting to make sure that it is firmly attached to the stairs. Fix any carpet that is loose or worn. Avoid having throw rugs at the top or bottom of  the stairs. If you do have throw rugs, attach them to the floor with carpet tape. Make sure that you have a light switch at the top of the stairs and the bottom of the stairs. If you do not have them, ask someone to add them for you. What else can I do to help prevent falls? Wear shoes that: Do not have high heels. Have rubber bottoms. Are comfortable and fit you well. Are closed at the toe. Do not wear sandals. If you use a stepladder: Make sure that it is fully opened. Do not climb a closed stepladder. Make sure that both sides of the stepladder are locked into place. Ask someone to hold it for you, if possible. Clearly mark and make sure that you can see: Any grab bars or  handrails. First and last steps. Where the edge of each step is. Use tools that help you move around (mobility aids) if they are needed. These include: Canes. Walkers. Scooters. Crutches. Turn on the lights when you go into a dark area. Replace any light bulbs as soon as they burn out. Set up your furniture so you have a clear path. Avoid moving your furniture around. If any of your floors are uneven, fix them. If there are any pets around you, be aware of where they are. Review your medicines with your doctor. Some medicines can make you feel dizzy. This can increase your chance of falling. Ask your doctor what other things that you can do to help prevent falls. This information is not intended to replace advice given to you by your health care provider. Make sure you discuss any questions you have with your health care provider. Document Released: 05/23/2009 Document Revised: 01/02/2016 Document Reviewed: 08/31/2014 Elsevier Interactive Patient Education  2017 [employer].

## 2021-04-10 NOTE — Progress Notes (Signed)
I connected with [name] today by telephone and verified that I am speaking with the correct person using two identifiers. Location patient: home Location provider: work Persons participating in the virtual visit: Tracy Nelson, [place] LPN.   I discussed the limitations, risks, security and privacy concerns of performing an evaluation and management service by telephone and the availability of in person appointments. I also discussed with the patient that there may be a patient responsible charge related to this service. The patient expressed understanding and verbally consented to this telephonic visit.    Interactive audio and video telecommunications were attempted between this provider and patient, however failed, due to patient having technical difficulties OR patient did not have access to video capability.  We continued and completed visit with audio only.     Vital signs may be patient reported or missing.  Subjective:   Tracy Nelson is a 75 y.o. female who presents for an Initial Medicare Annual Wellness Visit.  Review of Systems     Cardiac Risk Factors include: advanced age (>54men, >17 women);hypertension;sedentary lifestyle     Objective:    Today's Vitals   04/10/21 1001  Weight: 105 lb (47.6 kg)  Height: 5\' 4"  (1.626 m)   Body mass index is 18.02 kg/m.  Advanced Directives 04/10/2021 03/10/2021 11/08/2020 10/19/2020 08/29/2020 08/14/2020  Does Patient Have a Medical Advance Directive? Yes Yes Yes Yes No No  Type of Advance Directive Healthcare Power of Attorney Living will;Healthcare Power of [place] - -  Does patient want to make changes to medical advance directive? - - - No - Patient declined - -  Copy of [place] in Chart? No - copy requested - No - copy requested No - copy requested - -  Would patient like information on creating a medical advance directive? - - - - - No  - Patient declined    Current Medications (verified) Outpatient Encounter Medications as of 04/10/2021  Medication Sig   acetaminophen (TYLENOL) 500 MG tablet Take 500-1,000 mg by mouth every 6 (six) hours as needed for moderate pain.   amiodarone (PACERONE) 200 MG tablet Take 1 tablet (200 mg total) by mouth daily.   apixaban (ELIQUIS) 2.5 MG TABS tablet Take 1 tablet (2.5 mg total) by mouth 2 (two) times daily.   azaTHIOprine (IMURAN) 50 MG tablet Take 50 mg by mouth daily.   bumetanide (BUMEX) 1 MG tablet Take 2 mg by mouth in the morning. 2 MG IN AM/1 MG IN PM   bumetanide (BUMEX) 1 MG tablet Take 1 mg by mouth every evening. 2 MG am/1 MG PM   cholestyramine (QUESTRAN) 4 g packet Take 1 packet (4 g total) by mouth daily.   cycloSPORINE (RESTASIS) 0.05 % ophthalmic emulsion Place 1 drop into both eyes 2 (two) times daily.   dapagliflozin propanediol (FARXIGA) 10 MG TABS tablet Take 1 tablet (10 mg total) by mouth daily before breakfast.   Ensure (ENSURE) Take 237 mLs by mouth 2 (two) times daily between meals.   folic acid (FOLVITE) 1 MG tablet TAKE 1 TABLET BY MOUTH EVERY DAY   hydroxychloroquine (PLAQUENIL) 200 MG tablet TAKE 1 TABLET BY MOUTH EVERY DAY   Menthol, Topical Analgesic, (ICY HOT BACK EX) Apply 1 application topically daily as needed (pain).   MITIGARE 0.6 MG CAPS Take 0.6 mg by mouth daily as needed (gout).   Multiple Vitamin (MULTIVITAMIN) tablet Take 1 tablet by mouth daily.  omeprazole (PRILOSEC) 40 MG capsule Take 40 mg by mouth daily.   polyethylene glycol (MIRALAX / GLYCOLAX) 17 g packet Take 17 g by mouth as needed.   predniSONE (DELTASONE) 5 MG tablet TAKE 1 TABLET BY MOUTH EVERY DAY WITH BREAKFAST   Vericiguat (VERQUVO) 10 MG TABS Take 10 mg by mouth daily.   ergocalciferol (VITAMIN D2) 1.25 MG (50000 UT) capsule Take 1 capsule (50,000 Units total) by mouth once a week. (Patient not taking: No sig reported)   No facility-administered encounter medications on file  as of 04/10/2021.    Allergies (verified) Baclofen and Penicillins   History: Past Medical History:  Diagnosis Date   Acid reflux 07/15/2020   Arthritis 07/15/2020   CHF (congestive heart failure) ([place])    Colitis 07/15/2020   Dilated cardiomyopathy ([place]) 07/15/2020   HFrEF (heart failure with reduced ejection fraction) ([place]) 07/15/2020   HTN (hypertension) 07/15/2020   Osteoporosis 07/15/2020   Palpitations 07/15/2020   Rheumatoid arthritis ([place]) 07/15/2020   Past Surgical History:  Procedure Laterality Date   CARDIOVERSION N/A 11/08/2020   Procedure: CARDIOVERSION;  Surgeon: [name], MD;  Location: [facility];  Service: Cardiovascular;  Laterality: N/A;   RIGHT HEART CATH N/A 08/14/2020   Procedure: RIGHT HEART CATH;  Surgeon: [name], MD;  Location: [facility];  Service: Cardiovascular;  Laterality: N/A;   TEE WITHOUT CARDIOVERSION N/A 11/08/2020   Procedure: TRANSESOPHAGEAL ECHOCARDIOGRAM (TEE);  Surgeon: [name], MD;  Location: [facility] ENDOSCOPY;  Service: Cardiovascular;  Laterality: N/A;   Family History  Problem Relation Age of Onset   Hypertension Mother    Diabetes Mother    Hypertension Father    Diabetes Father    Cancer Brother    Arthritis Maternal Grandmother    Lung disease Paternal Grandfather    Social History   Socioeconomic History   Marital status: Widowed    Spouse name: Not on file   Number of children: Not on file   Years of education: 16   Highest education level: Bachelor's degree (e.g., BA, AB, BS)  Occupational History   Occupation: Retired  Tobacco Use   Smoking status: Never   Smokeless tobacco: Never  Vaping Use   Vaping Use: Never used  Substance and Sexual Activity   Alcohol use: Never   Drug use: Never   Sexual activity: Not Currently  Other Topics Concern   Not on file  Social History Narrative   Not on file   Social Determinants of Health   Financial Resource Strain: Medium Risk   Difficulty of Paying  Living Expenses: Somewhat hard  Food Insecurity: No Food Insecurity   Worried About Charity fundraiser in the Last Year: Never true   Ran Out of Food in the Last Year: Never true  Transportation Needs: No Transportation Needs   Lack of Transportation (Medical): No   Lack of Transportation (Non-Medical): No  Physical Activity: Inactive   Days of Exercise per Week: 0 days   Minutes of Exercise per Session: 0 min  Stress: Not on file  Social Connections: Not on file    Tobacco Counseling Counseling given: Not Answered   Clinical Intake:  Pre-visit preparation completed: Yes  Pain : No/denies pain     Nutritional Status: BMI <19  Underweight Nutritional Risks: Nausea/ vomitting/ diarrhea (sometimes nausea and vomiting, thinks related to something she has eaten) Diabetes: No  How often do you need to have someone help you when you read instructions,  pamphlets, or other written materials from your doctor or pharmacy?: 1 - Never What is the last grade level you completed in school?: college  Diabetic? no  Interpreter Needed?: No  Information entered by :: NAllen LPN   Activities of Daily Living In your present state of health, do you have any difficulty performing the following activities: 04/10/2021 10/19/2020  Hearing? N N  Vision? N N  Difficulty concentrating or making decisions? N N  Walking or climbing stairs? Y Y  Dressing or bathing? N N  Doing errands, shopping? Y Y  Comment does not drive Facilities manager and eating ? N -  Using the Toilet? N -  In the past six months, have you accidently leaked urine? Y -  Do you have problems with loss of bowel control? N -  Managing your Medications? N -  Managing your Finances? N -  Housekeeping or managing your Housekeeping? N -  Some recent data might be hidden    Patient Care Team: Glendale Chard, MD as PCP - General (Internal Medicine) Jorge Ny, LCSW as Social Worker (Licensed Clinical Social  Worker)  Indicate any recent [employer] you may have received from other than [facility] providers in the past year (date may be approximate).     Assessment:   This is a routine wellness examination for [place].  Hearing/Vision screen No results found.  Dietary issues and exercise activities discussed: Current Exercise Habits: The patient does not participate in regular exercise at present   Goals Addressed             This Visit's Progress    Patient Stated       04/10/2021, wants to maintain       Depression Screen PHQ 2/9 Scores 04/10/2021 12/06/2020 11/14/2020  PHQ - 2 Score 0 0 0  PHQ- 9 Score - - 0    Fall Risk Fall Risk  04/10/2021 12/05/2020  Falls in the past year? 0 0  Number falls in past yr: - 0  Injury with Fall? - 0  Risk for fall due to : Medication side effect Impaired balance/gait  Follow up Falls evaluation completed;Education provided;Falls prevention discussed Falls evaluation completed    FALL RISK PREVENTION PERTAINING TO THE HOME:  Any stairs in or around the home? Yes  If so, are there any without handrails? No  Home free of loose throw rugs in walkways, pet beds, electrical cords, etc? Yes  Adequate lighting in your home to reduce risk of falls? Yes   ASSISTIVE DEVICES UTILIZED TO PREVENT FALLS:  Life alert? No  Use of a cane, walker or w/c? No  Grab bars in the bathroom? No  Shower chair or bench in shower? Yes  Elevated toilet seat or a handicapped toilet? Yes   TIMED UP AND GO:  Was the test performed? No .       Cognitive Function:     6CIT Screen 04/10/2021  What Year? 0 points  What month? 0 points  What time? 0 points  Count back from 20 0 points  Months in reverse 0 points  Repeat phrase 2 points  Total Score 2    Immunizations  There is no immunization history on file for this patient.  TDAP status: Due, Education has been provided regarding the importance of this vaccine. Advised may receive this vaccine at local  pharmacy or Health Dept. Aware to provide a copy of the vaccination record if obtained from local pharmacy or Health  Dept. Verbalized acceptance and understanding.  Flu Vaccine status: Declined, Education has been provided regarding the importance of this vaccine but patient still declined. Advised may receive this vaccine at local pharmacy or Health Dept. Aware to provide a copy of the vaccination record if obtained from local pharmacy or Health Dept. Verbalized acceptance and understanding.  Pneumococcal vaccine status: Declined,  Education has been provided regarding the importance of this vaccine but patient still declined. Advised may receive this vaccine at local pharmacy or Health Dept. Aware to provide a copy of the vaccination record if obtained from local pharmacy or Health Dept. Verbalized acceptance and understanding.   Covid-19 vaccine status: Declined, Education has been provided regarding the importance of this vaccine but patient still declined. Advised may receive this vaccine at local pharmacy or Health Dept.or vaccine clinic. Aware to provide a copy of the vaccination record if obtained from local pharmacy or Health Dept. Verbalized acceptance and understanding.  Qualifies for Shingles Vaccine? Yes   Zostavax completed No   Shingrix Completed?: No.    Education has been provided regarding the importance of this vaccine. Patient has been advised to call insurance company to determine out of pocket expense if they have not yet received this vaccine. Advised may also receive vaccine at local pharmacy or Health Dept. Verbalized acceptance and understanding.  Screening Tests Health Maintenance  Topic Date Due   COVID-19 Vaccine (1) Never done   INFLUENZA VACCINE  Never done   Zoster Vaccines- Shingrix (1 of 2) 05/16/2021 (Originally 08/10/1964)   DEXA SCAN  02/13/2022 (Originally 08/10/2010)   COLONOSCOPY (Pts 45-46yrs Insurance coverage will need to be confirmed)  02/13/2022 (Originally  08/10/1990)   TETANUS/TDAP  02/13/2022 (Originally [birth year]10/1964)   PNA vac Low Risk Adult (1 of 2 - PCV13) 02/13/2022 (Originally 08/10/2010)   Hepatitis C Screening  Completed   HPV VACCINES  Aged Out    Health Maintenance  Health Maintenance Due  Topic Date Due   COVID-19 Vaccine (1) Never done   INFLUENZA VACCINE  Never done    Colorectal cancer screening: No longer required.   Mammogram status: Ordered today. Pt provided with contact info and advised to call to schedule appt.   Bone Density status: states had  Lung Cancer Screening: (Low Dose CT Chest recommended if Age 38-80 years, 30 pack-year currently smoking OR have quit w/in 15years.) does not qualify.   Lung Cancer Screening Referral: no  Additional Screening:  Hepatitis C Screening: does qualify; Completed 02/13/2021  Vision Screening: Recommended annual ophthalmology exams for early detection of glaucoma and other disorders of the eye. Is the patient up to date with their annual eye exam?  Yes  Who is the provider or what is the name of the office in which the patient attends annual eye exams? Does not remember If pt is not established with a provider, would they like to be referred to a provider to establish care? No .   Dental Screening: Recommended annual dental exams for proper oral hygiene  Community Resource Referral / Chronic Care Management: CRR required this visit?  No   CCM required this visit?  Yes      Plan:     I have personally reviewed and noted the following in the patient's chart:   Medical and social history Use of alcohol, tobacco or illicit drugs  Current medications and supplements including opioid prescriptions. Patient is not currently taking opioid prescriptions. Functional ability and status Nutritional status Physical activity Advanced directives List of  other physicians Hospitalizations, surgeries, and ER visits in previous 12 months Vitals Screenings to include cognitive,  depression, and falls Referrals and appointments  In addition, I have reviewed and discussed with patient certain preventive protocols, quality metrics, and best practice recommendations. A written personalized care plan for preventive services as well as general preventive health recommendations were provided to patient.     Kellie Simmering, LPN   01/15/1274   Nurse Notes:

## 2021-04-13 ENCOUNTER — Other Ambulatory Visit (HOSPITAL_COMMUNITY): Payer: Self-pay | Admitting: Cardiology

## 2021-04-21 ENCOUNTER — Telehealth (HOSPITAL_COMMUNITY): Payer: Self-pay | Admitting: *Deleted

## 2021-04-21 NOTE — Telephone Encounter (Signed)
Reaching out to patient to offer assistance regarding upcoming cardiac imaging study; pt verbalizes understanding of appt date/time, parking situation and where to check in, and verified current allergies; name and call back number provided for further questions should they arise  Diamantina Edinger RN Navigator Cardiac Imaging [facility] [phone] office [phone] cell  

## 2021-04-22 ENCOUNTER — Telehealth (HOSPITAL_COMMUNITY): Payer: Self-pay | Admitting: *Deleted

## 2021-04-22 NOTE — Telephone Encounter (Signed)
New CMRI auth faxed to [phone].  Previous auth expired.

## 2021-04-23 ENCOUNTER — Telehealth (HOSPITAL_COMMUNITY): Payer: Self-pay | Admitting: *Deleted

## 2021-04-23 ENCOUNTER — Ambulatory Visit (HOSPITAL_COMMUNITY): Admission: RE | Admit: 2021-04-23 | Payer: Medicare PPO | Source: Ambulatory Visit

## 2021-04-23 ENCOUNTER — Telehealth: Payer: Self-pay | Admitting: *Deleted

## 2021-04-23 NOTE — Telephone Encounter (Signed)
Pt called upset that her CMRI was r/s. I explained to her that she was scheduled outside of the dates her authorization was valid for and I had to submit new authorization. Once I receive authorization we can r/s the procedure. I apologized for the inconvenience and told her pt I would let her know once we had approval. Pt thanked me for the call and explanation.

## 2021-04-23 NOTE — Chronic Care Management (AMB) (Signed)
  Chronic Care Management   Note  04/23/2021 Name: Jossalin Chervenak MRN: [id number] DOB: 07/21/1946  Celestia Duva is a 75 y.o. year old female who is a primary care patient of Glendale Chard, MD. I reached out to [name] by phone today in response to a referral sent by Ms. Katharine Look Holan's PCP, Dr. Baird Cancer.      Ms. Veltri was given information about Chronic Care Management services today including:  CCM service includes personalized support from designated clinical staff supervised by her physician, including individualized plan of care and coordination with other care providers 24/7 contact phone numbers for assistance for urgent and routine care needs. Service will only be billed when office clinical staff spend 20 minutes or more in a month to coordinate care. Only one practitioner may furnish and bill the service in a calendar month. The patient may stop CCM services at any time (effective at the end of the month) by phone call to the office staff. The patient will be responsible for cost sharing (co-pay) of up to 20% of the service fee (after annual deductible is met).  Patient agreed to services and verbal consent obtained.   Follow up plan: Telephone appointment with care management team member scheduled for:05/15/21  [facility]  Direct Dial: [phone]

## 2021-04-24 ENCOUNTER — Telehealth (HOSPITAL_COMMUNITY): Payer: Self-pay | Admitting: Vascular Surgery

## 2021-04-24 NOTE — Telephone Encounter (Signed)
Pt called stating Radiology scheduling said she wont be scheduled to get CMRI until Oct, pt was told to call [facility] to request she get a ASAP CMRI

## 2021-05-01 ENCOUNTER — Telehealth (HOSPITAL_COMMUNITY): Payer: Self-pay | Admitting: *Deleted

## 2021-05-01 NOTE — Telephone Encounter (Signed)
New CMRI auth

## 2021-05-11 ENCOUNTER — Other Ambulatory Visit (HOSPITAL_COMMUNITY): Payer: Self-pay | Admitting: Cardiology

## 2021-05-13 ENCOUNTER — Telehealth: Payer: Self-pay

## 2021-05-13 ENCOUNTER — Encounter (HOSPITAL_COMMUNITY): Payer: Medicare PPO | Admitting: Cardiology

## 2021-05-13 NOTE — Chronic Care Management (AMB) (Signed)
Chronic Care Management Pharmacy Assistant   Name: Tracy Nelson  MRN: [id number] DOB: May 29, 1946   Reason for Encounter: Chart review for CPP visit on 05-15-2021   Conditions to be addressed/monitored: Atrial Fibrillation, CHF, and CKD Stage 3 , Hypotension   Recent office visits:  04-10-2021 Kellie Simmering, LPN. Medicare wellness visit. Orders placed for mammogram.  02-13-2021 Bary Castilla, NP. Physical exam. INCREASE Prilosec 20 mg TO 40 daily. A1C= 6.3. Trig= 152.   11-14-2020 Ghumman, Ramandeep, NP. DECREASE Ensure 237 mls twice daily TO once daily. Referral placed to Rheumatology and dermatology.  Recent consult visits:  03-10-2021 Cammie Sickle, MD (Oncology). Follow up on Monoclonal gammopathy.  02-26-2021 Jacquelin Hawking, NP (Oncology). Follow up on cardiac amyloidosis.  02-12-2021 Marylouise Stacks, EMT. Paramedic encounter.  02-12-2021 Rafael Bihari, [name] (heart and vascular). BUN= 37, Creatinine= 2.14, GFR, estimated= 24. M protein SerPI Elph-Mcnc= 0.6, IFE1= shows IgG monoclonal protein with lambda light chain specificity. EKG ordered.  01-15-2021 [name], MD (Cardiology). Cardiac rehab.  01-09-2021 [name], MD (Rheumatology). STOP imuran. Follow up in 2 months.  01-08-2021 [name], MD (Cardiology). Cardiac rehab.  01-01-2021 [name], MD (Cardiology). Cardiac rehab.  12-30-2020 [name], MD (Cardiology). Cardiac rehab.  12-26-2020 Marylouise Stacks, EMT. Paramedic encounter.  12-25-2020 [name], MD (Cardiology) Cardiac rehab.  12-17-2020 [name], MD (Cardiology). BUN= 46, Creatinine= 2.24, GFR= 22. EKG ordered. Multiple myeloma collected.  12-16-2020 [name], MD (Cardiology). Cardiac rehab.  12-11-2020 [name], MD (Cardiology). Cardiac rehab.  12-09-2020 [name], MD (Cardiology). Cardiac rehab.  12-05-2020 [name], MD (Cardiology).  Cardiac rehab.  12-04-2020 Shirley Friar, PA-C [facility]). Follow up.  11-29-2020 Milus Banister, MD [facility]). START Nystatin 5 mls 4 times daily for 14 days swish and swallow.  11-28-2020 Marylouise Stacks, EMT. Paramedic encounter.  11-20-2020 Thompson Grayer, MD (Cardiology). EKG ordered. NT-Pro BNP= 6,498.  11-12-2020 [name], MD (Cardiology). Ekg ordered. BUN= 32, Craetinine= 2.01, GFR= 25  Hospital visits:  None in previous 6 months  Medications: Outpatient Encounter Medications as of 05/13/2021  Medication Sig   acetaminophen (TYLENOL) 500 MG tablet Take 500-1,000 mg by mouth every 6 (six) hours as needed for moderate pain.   amiodarone (PACERONE) 200 MG tablet Take 1 tablet (200 mg total) by mouth daily.   apixaban (ELIQUIS) 2.5 MG TABS tablet Take 1 tablet (2.5 mg total) by mouth 2 (two) times daily.   azaTHIOprine (IMURAN) 50 MG tablet Take 50 mg by mouth daily.   bumetanide (BUMEX) 1 MG tablet Take 2 mg by mouth in the morning. 2 MG IN AM/1 MG IN PM   bumetanide (BUMEX) 1 MG tablet Take 1 mg by mouth every evening. 2 MG am/1 MG PM   cholestyramine (QUESTRAN) 4 g packet Take 1 packet (4 g total) by mouth daily.   cycloSPORINE (RESTASIS) 0.05 % ophthalmic emulsion Place 1 drop into both eyes 2 (two) times daily.   Ensure (ENSURE) Take 237 mLs by mouth 2 (two) times daily between meals.   ergocalciferol (VITAMIN D2) 1.25 MG (50000 UT) capsule Take 1 capsule (50,000 Units total) by mouth once a week. (Patient not taking: No sig reported)   FARXIGA 10 MG TABS tablet TAKE 1 TABLET BY MOUTH DAILY BEFORE BREAKFAST.   folic acid (FOLVITE) 1 MG tablet TAKE 1 TABLET BY MOUTH EVERY DAY   hydroxychloroquine (PLAQUENIL) 200 MG tablet TAKE 1 TABLET  BY MOUTH EVERY DAY   Menthol, Topical Analgesic, (ICY HOT BACK EX) Apply 1 application topically daily as needed (pain).   MITIGARE 0.6 MG CAPS Take 0.6 mg by mouth daily as needed (gout).   Multiple Vitamin (MULTIVITAMIN)  tablet Take 1 tablet by mouth daily.   omeprazole (PRILOSEC) 40 MG capsule Take 40 mg by mouth daily.   polyethylene glycol (MIRALAX / GLYCOLAX) 17 g packet Take 17 g by mouth as needed.   predniSONE (DELTASONE) 5 MG tablet TAKE 1 TABLET BY MOUTH EVERY DAY WITH BREAKFAST   Vericiguat (VERQUVO) 10 MG TABS Take 10 mg by mouth daily.   No facility-administered encounter medications on file as of 05/13/2021.   Have you seen any other providers since your last visit? ** Any changes in your medications or health? Any side effects from any medications?  Do you have an symptoms or problems not managed by your medications?  Any concerns about your health right now?  Has your provider asked that you check blood pressure, blood sugar, or follow special diet at home?  Do you get any type of exercise on a regular basis? Can you think of a goal you would like to reach for your health? Do you have any problems getting your medications?  Is there anything that you would like to discuss during the appointment?  Please bring medications and supplements to appointment  05-13-2021: 1st attempt left VM 05-14-2021: 2nd attempt left VM 05-15-2021: 3rd attempt left VM  Care Gaps: Covid vaccine never done Flu vaccine never done AWV 04-10-2021  Star Rating Drugs: None  Jeannette How [facility] Clinical Pharmacist Assistant [phone]

## 2021-05-15 ENCOUNTER — Other Ambulatory Visit: Payer: Self-pay | Admitting: Internal Medicine

## 2021-05-15 ENCOUNTER — Ambulatory Visit (INDEPENDENT_AMBULATORY_CARE_PROVIDER_SITE_OTHER): Payer: Medicare PPO

## 2021-05-15 ENCOUNTER — Other Ambulatory Visit (HOSPITAL_COMMUNITY): Payer: Self-pay | Admitting: Cardiology

## 2021-05-15 DIAGNOSIS — I10 Essential (primary) hypertension: Secondary | ICD-10-CM

## 2021-05-15 DIAGNOSIS — I5022 Chronic systolic (congestive) heart failure: Secondary | ICD-10-CM

## 2021-05-15 DIAGNOSIS — M810 Age-related osteoporosis without current pathological fracture: Secondary | ICD-10-CM

## 2021-05-15 NOTE — Progress Notes (Signed)
Chronic Care Management Pharmacy Note  05/21/2021 Name:  Tracy Nelson MRN:  [id number] DOB:  Feb 05, 1946  Summary: Patient reports that her Tracy Nelson is very expensive and she is having trouble affording it.   Recommendations/Changes made from today's visit: Recommend patient complete patient assistance paperwork for Farxiga medication to reduce the cost. Recommend patient get DEXA scan completed for bone health.   Plan: Patients Farxiga patient assistance application to be mailed to her.  Patients DEXA scan scheduled for 10/30/2021 by myself at [facility].  Patient to have COVID-19 booster completed in November after having mammogram completed.    Subjective: Tracy Nelson is an 75 y.o. year old female who is a primary patient of Ghumman, Ramandeep, NP.  The CCM team was consulted for assistance with disease management and care coordination needs.    Engaged with patient by telephone for initial visit in response to provider referral for pharmacy case management and/or care coordination services. She lives with her daughter and family, because she is not able to do much. She is with three grand kids, daughter and son in law. They help her with things as much as they can. They work and she needs help with transportation. She has access now to the [facility] transportation and that takes to her to doctors appointments.   Consent to Services:  The patient was given the following information about Chronic Care Management services today, agreed to services, and gave verbal consent: 1. CCM service includes personalized support from designated clinical staff supervised by the primary care provider, including individualized plan of care and coordination with other care providers 2. 24/7 contact phone numbers for assistance for urgent and routine care needs. 3. Service will only be billed when office clinical staff spend 20 minutes or more in a month to coordinate care. 4. Only one  practitioner may furnish and bill the service in a calendar month. 5.The patient may stop CCM services at any time (effective at the end of the month) by phone call to the office staff. 6. The patient will be responsible for cost sharing (co-pay) of up to 20% of the service fee (after annual deductible is met). Patient agreed to services and consent obtained.  Patient Care Team: Charlesetta Ivory, NP as PCP - General (Nurse Practitioner) Burna Sis, LCSW as Social Worker (Licensed Clinical Social Worker) Harlan Stains, [facility] (Pharmacist)  Recent office visits:  04-10-2021 Barb Merino, LPN. Medicare wellness visit. Orders placed for mammogram.   02-13-2021 Charlesetta Ivory, NP. Physical exam. INCREASE Prilosec 20 mg TO 40 daily. A1C= 6.3. Trig= 152.    11-14-2020 Ghumman, Ramandeep, NP. DECREASE Ensure 237 mls twice daily TO once daily. Referral placed to Rheumatology and dermatology.   Recent consult visits:  03-10-2021 Earna Coder, MD (Oncology). Follow up on Monoclonal gammopathy.   02-26-2021 Mauro Kaufmann, NP (Oncology). Follow up on cardiac amyloidosis.   02-12-2021 Kerry Hough, EMT. Paramedic encounter.   02-12-2021 Jacklynn Ganong, [name] (heart and vascular). BUN= 37, Creatinine= 2.14, GFR, estimated= 24. M protein SerPI Elph-Mcnc= 0.6, IFE1= shows IgG monoclonal protein with lambda light chain specificity. EKG ordered.   01-15-2021 Laurey Morale, MD (Cardiology). Cardiac rehab.   01-09-2021 Fuller Plan, MD (Rheumatology). STOP imuran. Follow up in 2 months.   01-08-2021 Laurey Morale, MD (Cardiology). Cardiac rehab.   01-01-2021 Laurey Morale, MD (Cardiology). Cardiac rehab.   12-30-2020 Laurey Morale, MD (Cardiology). Cardiac rehab.   12-26-2020 Kerry Hough, EMT.  Paramedic encounter.   12-25-2020 [name], MD (Cardiology) Cardiac rehab.   12-17-2020 [name], MD (Cardiology). BUN= 46, Creatinine= 2.24,  GFR= 22. EKG ordered. Multiple myeloma collected.   12-16-2020 [name], MD (Cardiology). Cardiac rehab.   12-11-2020 [name], MD (Cardiology). Cardiac rehab.   12-09-2020 [name], MD (Cardiology). Cardiac rehab.   12-05-2020 [name], MD (Cardiology). Cardiac rehab.   12-04-2020 Shirley Friar, PA-C [facility] heartcare). Follow up.   11-29-2020 Milus Banister, MD Gertie Fey). START Nystatin 5 mls 4 times daily for 14 days swish and swallow.   11-28-2020 Marylouise Stacks, EMT. Paramedic encounter.   11-20-2020 Thompson Grayer, MD (Cardiology). EKG ordered. NT-Pro BNP= 6,498.   11-12-2020 [name], MD (Cardiology). Ekg ordered. BUN= 32, Craetinine= 2.01, GFR= 25   Hospital visits:  None in previous 6 months   Objective:  Lab Results  Component Value Date   CREATININE 2.08 (H) 02/26/2021   BUN 54 (H) 02/26/2021   GFRNONAA 24 (L) 02/26/2021   NA 137 02/26/2021   K 3.6 02/26/2021   CALCIUM 8.7 (L) 02/26/2021   CO2 27 02/26/2021   GLUCOSE 83 02/26/2021    Lab Results  Component Value Date/Time   HGBA1C 6.3 (H) 02/13/2021 09:59 AM    Last diabetic Eye exam: No results found for: HMDIABEYEEXA  Last diabetic Foot exam: No results found for: HMDIABFOOTEX   Lab Results  Component Value Date   CHOL 193 02/13/2021   HDL 96 02/13/2021   LDLCALC 72 02/13/2021   TRIG 152 (H) 02/13/2021   CHOLHDL 2.0 02/13/2021    Hepatic Function Latest Ref Rng & Units 02/26/2021 11/12/2020 10/19/2020  Total Protein 6.5 - 8.1 g/dL 7.0 7.1 6.0(L)  Albumin 3.5 - 5.0 g/dL 3.5 3.7 2.9(L)  AST 15 - 41 U/L _0 ALT 0 - 44 U/L _1 Alk Phosphatase 38 - 126 U/L 83 64 55  Total Bilirubin 0.3 - 1.2 mg/dL 0.5 0.8 0.7  Bilirubin, Direct 0.0 - 0.2 mg/dL - - 0.2    Lab Results  Component Value Date/Time   TSH 2.426 11/05/2020 04:38 PM   TSH 3.322 10/21/2020 08:52 AM    CBC Latest Ref Rng & Units 02/26/2021 02/13/2021 11/05/2020  WBC 4.0 - 10.5 K/uL  8.3 8.4 7.8  Hemoglobin 12.0 - 15.0 g/dL 11.7(L) 11.8 11.6(L)  Hematocrit 36.0 - 46.0 % 37.3 35.5 36.9  Platelets 150 - 400 K/uL 262 264 291    No results found for: VD25OH  Clinical ASCVD: Yes  The 10-year ASCVD risk score (Arnett DK, et al., 2019) is: 11.6%   Values used to calculate the score:     Age: 43 years     Sex: Female     Is Non-Hispanic African American: Yes     Diabetic: No     Tobacco smoker: No     Systolic Blood Pressure: 93 mmHg     Is BP treated: Yes     HDL Cholesterol: 96 mg/dL     Total Cholesterol: 193 mg/dL    Depression screen [facility] 2/9 04/10/2021 12/06/2020 11/14/2020  Decreased Interest 0 0 0  Down, Depressed, Hopeless 0 0 0  PHQ - 2 Score 0 0 0  Altered sleeping - - 0  Tired, decreased energy - - 0  Change in appetite - - 0  Feeling bad or failure about yourself  - - 0  Trouble concentrating - - 0  Moving  slowly or fidgety/restless - - 0  Suicidal thoughts - - 0  PHQ-9 Score - - 0     DEXA scan ordered to evaluate the patient for osteoporosis.   Social History   Tobacco Use  Smoking Status Never  Smokeless Tobacco Never   BP Readings from Last 3 Encounters:  03/10/21 93/72  02/26/21 91/71  02/13/21 118/64   Pulse Readings from Last 3 Encounters:  03/10/21 87  02/26/21 79  02/13/21 60   Wt Readings from Last 3 Encounters:  04/10/21 105 lb (47.6 kg)  03/10/21 105 lb 3.2 oz (47.7 kg)  02/26/21 107 lb (48.5 kg)   BMI Readings from Last 3 Encounters:  04/10/21 18.02 kg/m  03/10/21 19.00 kg/m  02/26/21 19.32 kg/m    Assessment/Interventions: Review of patient past medical history, allergies, medications, health status, including review of consultants reports, laboratory and other test data, was performed as part of comprehensive evaluation and provision of chronic care management services.   SDOH:  (Social Determinants of Health) assessments and interventions performed: Yes SDOH Interventions    Flowsheet Row Most Recent Value   SDOH Interventions   Financial Strain Interventions --  [will send referral for food assistance]      SDOH Screenings   Alcohol Screen: Not on file  Depression (PHQ2-9): Low Risk    PHQ-2 Score: 0  Financial Resource Strain: High Risk   Difficulty of Paying Living Expenses: Very hard  Food Insecurity: No Food Insecurity   Worried About Charity fundraiser in the Last Year: Never true   Ran Out of Food in the Last Year: Never true  Housing: Low Risk    Last Housing Risk Score: 0  Physical Activity: Inactive   Days of Exercise per Week: 0 days   Minutes of Exercise per Session: 0 min  Social Connections: Not on file  Stress: Not on file  Tobacco Use: Low Risk    Smoking Tobacco Use: Never   Smokeless Tobacco Use: Never  Transportation Needs: No Transportation Needs   Lack of Transportation (Medical): No   Lack of Transportation (Non-Medical): No    CCM Care Plan  Allergies  Allergen Reactions   Baclofen Nausea And Vomiting   Penicillins Rash    Medications Reviewed Today     Reviewed by Mayford Knife, RPH (Pharmacist) on 05/15/21 at 0900  Med List Status: <None>   Medication Order Taking? Sig Documenting Provider Last Dose Status Informant  acetaminophen (TYLENOL) 500 MG tablet [id number] Yes Take 500-1,000 mg by mouth every 6 (six) hours as needed for moderate pain. [provider] Taking Active Self  amiodarone (PACERONE) 200 MG tablet [id number] Yes Take 1 tablet (200 mg total) by mouth daily. [name], MD Taking Active   apixaban [facility]) 2.5 MG TABS tablet [id number] Yes Take 1 tablet (2.5 mg total) by mouth 2 (two) times daily. [name], MD Taking Active   azaTHIOprine (IMURAN) 50 MG tablet [id number] Yes Take 50 mg by mouth daily. [provider] Taking Active   bumetanide (BUMEX) 1 MG tablet [id number] Yes Take 2 mg by mouth in the morning. 2 MG IN AM/1 MG IN PM [provider] Taking Active   bumetanide (BUMEX) 1 MG  tablet [id number] Yes Take 1 mg by mouth every evening. 2 MG am/1 MG PM [provider] Taking Active   cholecalciferol (VITAMIN D3) 25 MCG (1000 UNIT) tablet [id number] Yes Take 1,000 Units by mouth daily. [provider] Taking Active Self  cholestyramine (QUESTRAN) 4 g packet [id number] Yes Take 1 packet (4 g total) by mouth daily. [name], MD Taking Active Self  cycloSPORINE (RESTASIS) 0.05 % ophthalmic emulsion [id number] Yes Place 1 drop into both eyes 2 (two) times daily. Bary Castilla, NP Taking Active   Ensure [facility]) [id number] Yes Take 237 mLs by mouth 2 (two) times daily between meals. [provider] Taking Active Self  FARXIGA 10 MG TABS tablet [id number] Yes TAKE 1 TABLET BY MOUTH DAILY BEFORE BREAKFAST. [name], MD Taking Active   folic acid (FOLVITE) 1 MG tablet [id number] Yes TAKE 1 TABLET BY MOUTH EVERY DAY [name], MD Taking Active   hydroxychloroquine (PLAQUENIL) 200 MG tablet [id number] Yes TAKE 1 TABLET BY MOUTH EVERY DAY [name], MD Taking Active   Menthol, Topical Analgesic, (ICY HOT BACK EX) [id number] Yes Apply 1 application topically daily as needed (pain). [provider] Taking Active Self  MITIGARE 0.6 MG CAPS [id number] Yes Take 0.6 mg by mouth daily as needed (gout). [provider] Taking Active Self  Multiple Vitamin (MULTIVITAMIN) tablet [id number] Yes Take 1 tablet by mouth daily. [provider] Taking Active Self  omeprazole (PRILOSEC) 40 MG capsule [id number] Yes Take 40 mg by mouth daily. [provider] Taking Active Self  polyethylene glycol (MIRALAX / GLYCOLAX) 17 g packet [id number] Yes Take 17 g by mouth as needed. [provider] Taking Active Self  predniSONE (DELTASONE) 5 MG tablet [id number] Yes TAKE 1 TABLET BY MOUTH EVERY DAY WITH BREAKFAST [name], MD Taking Active   Vericiguat [facility]) 10 MG TABS [id number] Yes Take 10 mg by mouth daily.  [name], MD Taking Active Self            Patient Active Problem List   Diagnosis Date Noted   Monoclonal gammopathy 03/10/2021   Hypotension 10/22/2020   Atypical chest pain 10/22/2020   CKD (chronic kidney disease), stage III ([place]) 10/22/2020   Nausea & vomiting 10/22/2020   Physical deconditioning 10/22/2020   Atrial flutter ([place]) 10/18/2020   Arthritis 07/15/2020   GERD (gastroesophageal reflux disease) 07/15/2020   Colitis 07/15/2020   Non-ischemic cardiomyopathy ([place]) 07/15/2020   HFrEF (heart failure with reduced ejection fraction) ([place]) 07/15/2020   HTN (hypertension) 07/15/2020   Osteoporosis 07/15/2020   Palpitations 07/15/2020   Acute gout of left shoulder 07/10/2020   Rheumatoid arthritis involving multiple sites with positive rheumatoid factor ([place]) 10/28/2015   Mitral regurgitation 05/30/2014   Acute on chronic combined systolic (congestive) and diastolic (congestive) heart failure ([place]) 05/19/2014   Vitamin D deficiency 05/15/2013   Encounter for long-term (current) use of other medications 12/14/2008   Sjogren's syndrome ([place]) 12/14/2008     There is no immunization history on file for this patient.  Conditions to be addressed/monitored:  Hypertension and Chronic Kidney Disease  Care Plan : [place]  Updates made by Mayford Knife, RPH since 05/21/2021 12:00 AM     Problem: HTN, HF      Goal: Disease Management   Note:   Current Barriers:  Unable to independently afford treatment regimen Does not maintain contact with provider office Does not contact provider office for questions/concerns  Pharmacist Clinical Goal(s):  Patient will verbalize ability to afford treatment regimen achieve adherence to monitoring guidelines and medication adherence to achieve therapeutic efficacy through collaboration with PharmD and provider.   Interventions: 1:1 collaboration with [place]endale Chard, MD regarding development and update of  comprehensive plan  of care as evidenced by provider attestation and co-signature Inter-disciplinary care team collaboration (see longitudinal plan of care) Comprehensive medication review performed; medication list updated in electronic medical record  Hypertension (BP goal <130/80) -Controlled -Current treatment: Verquvo 10 mg tablet once per day  Amiodarone 200 mg tablet once per day  Bumex 2 mg - take 1 tablet by mouth daily, take 1 mg tablet by mouth daily  -Medications previously tried: spironolactone, metoprolol succinate  -Current home readings: 90/67, 90/56 HR: 80-90  -Current dietary habits: she is avoiding fried and fatty foods, she does not sprinkle any salt at all. She does not use canned vegetables anymore, and she tries to stick with frozen.  -Current exercise habits: she does low impact exercising: stepping in place, she does arm exercising, raising her legs up and down, she is doing this for 15 minutes per day before she gets tired.  -Denies hypotensive/hypertensive symptoms -Educated on Daily salt intake goal < 2300 mg; Importance of home blood pressure monitoring; Proper BP monitoring technique; -Counseled to monitor BP at home at least once per day, document, and provide log at future appointments -Counseled on diet and exercise extensively Recommended to continue current medication   Heart Failure (Goal: manage symptoms and prevent exacerbations) -Controlled -Last ejection fraction: <20 (Date: 12/21) -HF type: Left Ventricular Failure -NYHA Class: III (marked limitation of activity) -Current treatment: Cholestyramine 38m taking 1 packet by mouth daily Verquvo 10 mg tabet once per day Amiodarone 200 mg tablet once per day  Bumex 2 mg taking one tablet by mouth daily in the morning, bumex 1 mg tablet once per day in the evening -Medications previously tried: Metoprolol Succinate 50 mg tablet, Spironolactone 25 mg tablet  -Current home BP/HR readings: 90/67, 100/65  HR: 80-90  -Current dietary habits: patient reports limiting salt, avoiding canned foods.  -Current exercise habits: she does low impact exercising: stepping in place, she does arm exercising, raising her legs up and down, she is doing this for 15 minutes per day before she gets tired.  -Educated on Importance of weighing daily; if you gain more than 3 pounds in one day or 5 pounds in one week, call cardiologist team  Proper diuretic administration and potassium supplementation Importance of blood pressure control -Recommended to continue current medication  Health Maintenance -Vaccine gaps: COVID -19 Vaccine, Influenza  Vaccine     Notes: Patient is open to have bone density done.   Scheduled for Bone Density for 3/15 at 9AM.  -Collaborated with PCP team and [facility] to schedule patient for Bone density exam. Confirmed with patient that she was okay with the appointment time and schedule.   Patient Goals/Self-Care Activities Patient will:  - take medications as prescribed  Follow Up Plan: The patient has been provided with contact information for the care management team and has been advised to call with any health related questions or concerns.       Medication Assistance: Application for Farxiga  medication assistance program. in process.  Anticipated assistance start date 06/2021.  See plan of care for additional detail.  Compliance/Adherence/Medication fill history: Care Gaps: COVID-19 Vaccine  Influenza Vaccine   Star-Rating Drugs: Farxiga 10 mg tablet   Patient's preferred pharmacy is:  [id number] [place], [place]: [phone]: [phone] [address]: [phone]: [phone] pill box? Yes Pt endorses 95% compliance  We discussed: Benefits of medication synchronization, packaging and delivery as  well as enhanced pharmacist oversight with [employer]. Patient decided to: Continue current medication management strategy  Care Plan and Follow Up Patient Decision:  Patient agrees to Care Plan and Follow-up.  Plan: Telephone follow up appointment with care management team member scheduled for:  07/01/2021, The patient has been provided with contact information for the care management team and has been advised to call with any health related questions or concerns. , and Next PCP appointment scheduled for:   [name], PharmD Clinical Pharmacist [facility] [phone]

## 2021-05-19 ENCOUNTER — Telehealth (HOSPITAL_COMMUNITY): Payer: Self-pay | Admitting: Emergency Medicine

## 2021-05-19 NOTE — Telephone Encounter (Signed)
Attempted to call patient regarding upcoming cardiac MR appointment. Left message on voicemail with name and callback number Tanylah Schnoebelen RN Navigator Cardiac Imaging [place] [facility] [phone] Office [phone] Cell  

## 2021-05-20 ENCOUNTER — Telehealth: Payer: Self-pay

## 2021-05-20 NOTE — Chronic Care Management (AMB) (Signed)
Chronic Care Management Pharmacy Assistant   Name: Tracy Nelson  MRN: [id number] DOB: June 08, 1946  Reason for Encounter: Patient Assistance Coordination  05/20/2021- Patient assistance application filled out for Farxiga with [employer] patient assistance program. Patient assistance application also filled out for [employer] with [employer] Patient Assistance program.  06/06/2021- Called patient to follow up on [employer] patient assistance application for Ozempic, per patient she is not on Ozempic, discussed recommendations from CPP and PCP to help with Diabetes, an injectable once a week medication. Patient does not want to give herself an injection and wants to try diet to help with blood sugars. Patient had questions on what foods she should not eat or eat, patient stated she loves potatoes and rice and sweet tea and sodas. Informed patient about other choices to make, sweet potatoes instead of potatoes and rice, increase water intake and limit sweet tea and sodas to once or twice a week and to increase vegetables and low sugar fruits. Reviewed labs with patient and emailed Diabetes handout to patient that gave list of foods she should eat and ones to stay away from. Patient wants to continue with [place] and diet until she returns for labs to see if there has been any improvements.   06/09/2021- Missed call from patient Friday evening, called patient back to see if I can help with her any other questions or concerns, no answer left message to return call.  Patient called back but did respond, I heard talking the background. Hung up and called patient back, no answer, left message to return call.     Medications: Outpatient Encounter Medications as of 05/20/2021  Medication Sig   acetaminophen (TYLENOL) 500 MG tablet Take 500-1,000 mg by mouth every 6 (six) hours as needed for moderate pain.   amiodarone (PACERONE) 200 MG tablet Take 1 tablet (200 mg total) by mouth daily.   apixaban  (ELIQUIS) 2.5 MG TABS tablet Take 1 tablet (2.5 mg total) by mouth 2 (two) times daily.   azaTHIOprine (IMURAN) 50 MG tablet Take 50 mg by mouth daily.   bumetanide (BUMEX) 1 MG tablet Take 2 mg by mouth in the morning. 2 MG IN AM/1 MG IN PM   bumetanide (BUMEX) 1 MG tablet Take 1 mg by mouth every evening. 2 MG am/1 MG PM   cholecalciferol (VITAMIN D3) 25 MCG (1000 UNIT) tablet Take 1,000 Units by mouth daily.   cholestyramine (QUESTRAN) 4 g packet Take 1 packet (4 g total) by mouth daily.   cycloSPORINE (RESTASIS) 0.05 % ophthalmic emulsion Place 1 drop into both eyes 2 (two) times daily.   Ensure (ENSURE) Take 237 mLs by mouth 2 (two) times daily between meals.   FARXIGA 10 MG TABS tablet TAKE 1 TABLET BY MOUTH DAILY BEFORE BREAKFAST.   folic acid (FOLVITE) 1 MG tablet TAKE 1 TABLET BY MOUTH EVERY DAY   hydroxychloroquine (PLAQUENIL) 200 MG tablet TAKE 1 TABLET BY MOUTH EVERY DAY   Menthol, Topical Analgesic, (ICY HOT BACK EX) Apply 1 application topically daily as needed (pain).   MITIGARE 0.6 MG CAPS Take 0.6 mg by mouth daily as needed (gout).   Multiple Vitamin (MULTIVITAMIN) tablet Take 1 tablet by mouth daily.   omeprazole (PRILOSEC) 40 MG capsule Take 40 mg by mouth daily.   polyethylene glycol (MIRALAX / GLYCOLAX) 17 g packet Take 17 g by mouth as needed.   predniSONE (DELTASONE) 5 MG tablet TAKE 1 TABLET BY MOUTH EVERY DAY WITH BREAKFAST   Vericiguat (  VERQUVO) 10 MG TABS Take 10 mg by mouth daily.   No facility-administered encounter medications on file as of 05/20/2021.   Pattricia Boss, [place] Pharmacist Assistant [phone]

## 2021-05-21 ENCOUNTER — Other Ambulatory Visit: Payer: Self-pay

## 2021-05-21 ENCOUNTER — Ambulatory Visit (HOSPITAL_COMMUNITY)
Admission: RE | Admit: 2021-05-21 | Discharge: 2021-05-21 | Disposition: A | Discharge status: Home / Self Care | Payer: Medicare PPO | Source: Ambulatory Visit | Attending: Cardiology | Admitting: Cardiology

## 2021-05-21 DIAGNOSIS — E8581 Light chain (AL) amyloidosis: Secondary | ICD-10-CM | POA: Insufficient documentation

## 2021-05-21 MED ORDER — GADOBUTROL 1 MMOL/ML IV SOLN
6.0000 mL | Freq: Once | INTRAVENOUS | Status: AC | PRN
  Administered 2021-05-21: 6 mL via INTRAVENOUS

## 2021-05-21 NOTE — Patient Instructions (Addendum)
Visit Information It was great speaking with you today!  Please let me know if you have any questions about our visit.   Goals Addressed             This Visit's Progress    Manage My Medicine       Timeframe:  Long-Range Goal Priority:  High Start Date:                             Expected End Date:                       Follow Up Date 07/01/2021    - call for medicine refill 2 or 3 days before it runs out - call if I am sick and can't take my medicine - keep a list of all the medicines I take; vitamins and herbals too - use a pillbox to sort medicine    Why is this important?   These steps will help you keep on track with your medicines.   Notes:  Please call with any questions.         Patient Care Plan: CCM Pharmacy Care Plan     Problem Identified: HTN, HF      Goal: Disease Management   Note:   Current Barriers:  Unable to independently afford treatment regimen Does not maintain contact with provider office Does not contact provider office for questions/concerns  Pharmacist Clinical Goal(s):  Patient will verbalize ability to afford treatment regimen achieve adherence to monitoring guidelines and medication adherence to achieve therapeutic efficacy through collaboration with PharmD and provider.   Interventions: 1:1 collaboration with Glendale Chard, MD regarding development and update of comprehensive plan of care as evidenced by provider attestation and co-signature Inter-disciplinary care team collaboration (see longitudinal plan of care) Comprehensive medication review performed; medication list updated in electronic medical record  Hypertension (BP goal <130/80) -Controlled -Current treatment: Verquvo 10 mg tablet once per day  Amiodarone 200 mg tablet once per day  Bumex 2 mg - take 1 tablet by mouth daily, take 1 mg tablet by mouth daily  -Medications previously tried: spironolactone, metoprolol succinate  -Current home readings: 90/67, 90/56  HR: 80-90  -Current dietary habits: she is avoiding fried and fatty foods, she does not sprinkle any salt at all. She does not use canned vegetables anymore, and she tries to stick with frozen.  -Current exercise habits: she does low impact exercising: stepping in place, she does arm exercising, raising her legs up and down, she is doing this for 15 minutes per day before she gets tired.  -Denies hypotensive/hypertensive symptoms -Educated on Daily salt intake goal < 2300 mg; Importance of home blood pressure monitoring; Proper BP monitoring technique; -Counseled to monitor BP at home at least once per day, document, and provide log at future appointments -Counseled on diet and exercise extensively Recommended to continue current medication   Heart Failure (Goal: manage symptoms and prevent exacerbations) -Controlled -Last ejection fraction: <20 (Date: 12/21) -HF type: Left Ventricular Failure -NYHA Class: III (marked limitation of activity) -Current treatment: Cholestyramine 4mg  taking 1 packet by mouth daily Verquvo 10 mg tabet once per day Amiodarone 200 mg tablet once per day  Bumex 2 mg taking one tablet by mouth daily in the morning, bumex 1 mg tablet once per day in the evening -Medications previously tried: Metoprolol Succinate 50 mg tablet, Spironolactone 25 mg tablet  -Current home BP/HR  readings: 90/67, 100/65 HR: 80-90  -Current dietary habits: patient reports limiting salt, avoiding canned foods.  -Current exercise habits: she does low impact exercising: stepping in place, she does arm exercising, raising her legs up and down, she is doing this for 15 minutes per day before she gets tired.  -Educated on Importance of weighing daily; if you gain more than 3 pounds in one day or 5 pounds in one week, call cardiologist team  Proper diuretic administration and potassium supplementation Importance of blood pressure control -Recommended to continue current medication  Health  Maintenance -Vaccine gaps: COVID -19 Vaccine, Influenza  Vaccine     Notes: Patient is open to have bone density done.   Scheduled for Bone Density for 3/15 at 9AM.  -Collaborated with PCP team and [facility] to schedule patient for Bone density exam. Confirmed with patient that she was okay with the appointment time and schedule.   Patient Goals/Self-Care Activities Patient will:  - take medications as prescribed  Follow Up Plan: The patient has been provided with contact information for the care management team and has been advised to call with any health related questions or concerns.       Ms. Clowdus was given information about Chronic Care Management services today including:  CCM service includes personalized support from designated clinical staff supervised by her physician, including individualized plan of care and coordination with other care providers 24/7 contact phone numbers for assistance for urgent and routine care needs. Standard insurance, coinsurance, copays and deductibles apply for chronic care management only during months in which we provide at least 20 minutes of these services. Most insurances cover these services at 100%, however patients may be responsible for any copay, coinsurance and/or deductible if applicable. This service may help you avoid the need for more expensive face-to-face services. Only one practitioner may furnish and bill the service in a calendar month. The patient may stop CCM services at any time (effective at the end of the month) by phone call to the office staff.  Patient agreed to services and verbal consent obtained.   The patient verbalized understanding of instructions, educational materials, and care plan provided today and agreed to receive a mailed copy of patient instructions, educational materials, and care plan.   [name], PharmD Clinical Pharmacist [facility] [phone]

## 2021-05-22 ENCOUNTER — Ambulatory Visit: Payer: Medicare PPO | Admitting: Nurse Practitioner

## 2021-05-30 ENCOUNTER — Other Ambulatory Visit (HOSPITAL_COMMUNITY): Payer: Self-pay | Admitting: Cardiology

## 2021-06-03 ENCOUNTER — Ambulatory Visit
Admission: RE | Admit: 2021-06-03 | Discharge: 2021-06-03 | Disposition: A | Discharge status: Home / Self Care | Payer: Medicare PPO | Source: Ambulatory Visit | Attending: Internal Medicine | Admitting: Internal Medicine

## 2021-06-03 ENCOUNTER — Other Ambulatory Visit: Payer: Self-pay

## 2021-06-03 DIAGNOSIS — Z1231 Encounter for screening mammogram for malignant neoplasm of breast: Secondary | ICD-10-CM

## 2021-06-04 ENCOUNTER — Encounter: Payer: Self-pay | Admitting: Nurse Practitioner

## 2021-06-04 ENCOUNTER — Other Ambulatory Visit (HOSPITAL_COMMUNITY)
Admission: RE | Admit: 2021-06-04 | Discharge: 2021-06-04 | Disposition: A | Discharge status: Home / Self Care | Payer: Medicare PPO | Source: Ambulatory Visit | Attending: Nurse Practitioner | Admitting: Nurse Practitioner

## 2021-06-04 ENCOUNTER — Ambulatory Visit: Payer: Medicare PPO | Admitting: Nurse Practitioner

## 2021-06-04 VITALS — BP 120/70 | HR 84 | Temp 98.7°F | Ht 61.8 in | Wt 107.2 lb

## 2021-06-04 DIAGNOSIS — R220 Localized swelling, mass and lump, head: Secondary | ICD-10-CM

## 2021-06-04 DIAGNOSIS — N898 Other specified noninflammatory disorders of vagina: Secondary | ICD-10-CM

## 2021-06-04 DIAGNOSIS — R7303 Prediabetes: Secondary | ICD-10-CM

## 2021-06-04 DIAGNOSIS — M0579 Rheumatoid arthritis with rheumatoid factor of multiple sites without organ or systems involvement: Secondary | ICD-10-CM | POA: Diagnosis not present

## 2021-06-04 DIAGNOSIS — I10 Essential (primary) hypertension: Secondary | ICD-10-CM | POA: Diagnosis not present

## 2021-06-04 DIAGNOSIS — N184 Chronic kidney disease, stage 4 (severe): Secondary | ICD-10-CM

## 2021-06-04 DIAGNOSIS — I5022 Chronic systolic (congestive) heart failure: Secondary | ICD-10-CM

## 2021-06-04 DIAGNOSIS — B370 Candidal stomatitis: Secondary | ICD-10-CM

## 2021-06-04 LAB — POCT URINALYSIS DIPSTICK
Bilirubin, UA: NEGATIVE
Blood, UA: NEGATIVE
Glucose, UA: POSITIVE — AB
Ketones, UA: NEGATIVE
Leukocytes, UA: NEGATIVE
Nitrite, UA: NEGATIVE
Protein, UA: NEGATIVE
Spec Grav, UA: 1.01 (ref 1.010–1.025)
Urobilinogen, UA: 0.2 E.U./dL
pH, UA: 5 (ref 5.0–8.0)

## 2021-06-04 MED ORDER — NYSTATIN 100000 UNIT/ML MT SUSP: 5.0000 mL | Freq: Four times a day (QID) | OROMUCOSAL | 0 refills | Status: AC

## 2021-06-04 NOTE — Progress Notes (Signed)
I,[name],acting as a Education administrator for [employer], NP.,have documented all relevant documentation on the behalf of [employer], NP,as directed by  Bary Castilla, NP while in the presence of Bary Castilla, NP.  This visit occurred during the SARS-CoV-2 public health emergency.  Safety protocols were in place, including screening questions prior to the visit, additional usage of staff PPE, and extensive cleaning of exam room while observing appropriate contact time as indicated for disinfecting solutions.  Subjective:     [name] , female    DOB: [birth year] , 75 y.o.   MRN: [id number]   Chief Complaint  Patient presents with   Prediabetes   Hypertension    HPI  Patient is here for prediabetes and HTN follow up. She goes to [facility]. She doesn't drive and gets transportation.   She is from [place] area and currently now lives in this area. She states her face is swelling due to her taking prednisone for RA for a very long time.  Diet: She tries to be healthy  Exercise: She is up and walking at home  She lives with her daughter and her family.     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   HFrEF (heart failure with reduced ejection fraction) ([place]) 07/15/2020   HTN (hypertension) 07/15/2020   Osteoporosis 07/15/2020   Palpitations 07/15/2020   Rheumatoid arthritis ([place]) 07/15/2020     Family History  Problem Relation Age of Onset   Hypertension Mother    Diabetes Mother    Hypertension Father    Diabetes Father    Breast cancer Maternal Aunt    Breast cancer Paternal Aunt    Arthritis Maternal Grandmother    Lung disease Paternal Grandfather    Cancer Brother      Current Outpatient Medications:    acetaminophen (TYLENOL) 500 MG tablet, Take 500-1,000 mg by mouth every 6 (six) hours as  needed for moderate pain., Disp: , Rfl:    amiodarone (PACERONE) 200 MG tablet, Take 1 tablet (200 mg total) by mouth daily., Disp: 60 tablet, Rfl: 6   azaTHIOprine (IMURAN) 50 MG tablet, Take 50 mg by mouth daily., Disp: , Rfl:    bumetanide (BUMEX) 1 MG tablet, Take 2 mg by mouth in the morning. 2 MG IN AM/1 MG IN PM, Disp: , Rfl:    bumetanide (BUMEX) 1 MG tablet, Take 1 mg by mouth every evening. 2 MG am/1 MG PM, Disp: , Rfl:    cholecalciferol (VITAMIN D3) 25 MCG (1000 UNIT) tablet, Take 1,000 Units by mouth daily., Disp: , Rfl:    cholestyramine (QUESTRAN) 4 g packet, Take 1 packet (4 g total) by mouth daily., Disp: 60 each, Rfl: 12   cycloSPORINE (RESTASIS) 0.05 % ophthalmic emulsion, Place 1 drop into both eyes 2 (two) times daily., Disp: 1.5 mL, Rfl: 2   ELIQUIS 2.5 MG TABS tablet, TAKE 1 TABLET BY MOUTH TWICE A DAY, Disp: 60 tablet, Rfl: 6   Ensure (ENSURE), Take 237 mLs by mouth 2 (two) times daily between meals., Disp: , Rfl:    FARXIGA 10 MG TABS tablet, TAKE 1 TABLET BY MOUTH DAILY BEFORE BREAKFAST., Disp: 30 tablet, Rfl: 5   folic acid (FOLVITE) 1 MG tablet, TAKE 1 TABLET BY MOUTH EVERY DAY, Disp: 30 tablet, Rfl: 3   hydroxychloroquine (PLAQUENIL) 200 MG tablet,  TAKE 1 TABLET BY MOUTH EVERY DAY, Disp: 30 tablet, Rfl: 11   Menthol, Topical Analgesic, (ICY HOT BACK EX), Apply 1 application topically daily as needed (pain)., Disp: , Rfl:    MITIGARE 0.6 MG CAPS, Take 0.6 mg by mouth daily as needed (gout)., Disp: , Rfl:    Multiple Vitamin (MULTIVITAMIN) tablet, Take 1 tablet by mouth daily., Disp: , Rfl:    nystatin (MYCOSTATIN) 100000 UNIT/ML suspension, Take 5 mLs (500,000 Units total) by mouth 4 (four) times daily for 14 days. Please swish and swallow, Disp: 280 mL, Rfl: 0   omeprazole (PRILOSEC) 40 MG capsule, Take 40 mg by mouth daily., Disp: , Rfl:    polyethylene glycol (MIRALAX / GLYCOLAX) 17 g packet, Take 17 g by mouth as needed., Disp: , Rfl:    predniSONE (DELTASONE) 5 MG  tablet, TAKE 1 TABLET BY MOUTH EVERY DAY WITH BREAKFAST, Disp: 30 tablet, Rfl: 3   Vericiguat (VERQUVO) 10 MG TABS, Take 10 mg by mouth daily., Disp: 30 tablet, Rfl: 11   Allergies  Allergen Reactions   Baclofen Nausea And Vomiting   Penicillins Rash     Review of Systems  Constitutional: Negative.  Negative for chills, fatigue and fever.  HENT:  Positive for facial swelling. Negative for congestion, mouth sores, rhinorrhea and sinus pressure.        Pt states it could be due to her taking prednisone for many years   Eyes:  Negative for visual disturbance.  Respiratory: Negative.  Negative for cough, shortness of breath and wheezing.   Cardiovascular: Negative.  Negative for chest pain and palpitations.  Gastrointestinal: Negative.  Negative for constipation and diarrhea.  Endocrine: Negative for polydipsia, polyphagia and polyuria.  Genitourinary:  Negative for vaginal bleeding, vaginal discharge and vaginal pain.       Vaginal itching   Musculoskeletal:  Positive for joint swelling.       Hx of RA   Neurological: Negative.  Negative for weakness and headaches.    Today's Vitals   06/04/21 1046  BP: 120/70  Pulse: 84  Temp: 98.7 F (37.1 C)  TempSrc: Oral  Weight: 107 lb 3.2 oz (48.6 kg)  Height: 5' 1.8" (1.57 m)   Body mass index is 19.73 kg/m.  Wt Readings from Last 3 Encounters:  06/04/21 107 lb 3.2 oz (48.6 kg)  04/10/21 105 lb (47.6 kg)  03/10/21 105 lb 3.2 oz (47.7 kg)    Objective:  Physical Exam Constitutional:      Appearance: Normal appearance.  HENT:     Head: Normocephalic and atraumatic.     Mouth/Throat:     Pharynx: Posterior oropharyngeal erythema present.     Comments: Some thrush noted in the posterior of the mouth  Cardiovascular:     Rate and Rhythm: Normal rate and regular rhythm.     Pulses: Normal pulses.     Heart sounds: Normal heart sounds. No murmur heard. Pulmonary:     Effort: Pulmonary effort is normal. No respiratory distress.      Breath sounds: Normal breath sounds. No wheezing.  Musculoskeletal:        General: Swelling present.     Right hand: Tenderness and bony tenderness present. No swelling. Decreased strength.     Left hand: Tenderness and bony tenderness present. No swelling. Decreased strength.     Comments: Joint swelling in both hands due to hx of RA   Skin:    General: Skin is warm and dry.  Capillary Refill: Capillary refill takes less than 2 seconds.  Neurological:     Mental Status: She is alert and oriented to person, place, and time.        Assessment And Plan:     1. Prediabetes -will check Hgb A1c  -She is currently taking farsiga prescribed by her cardiologist  - Hemoglobin A1c  2. Hypertension, unspecified type -Chronic, stable  -Continue meds  -Follow up with cardiologist  - CMP14+EGFR  3. Vaginal itching -Evaluated meds with the patient. She has had vaginal itch in the past. Could be due to her taking farsiga. Recommended she tries OTC monistat to help; if symptoms gets worse to notify us.  - Urine cytology ancillary only - POCT Urinalysis Dipstick (81002)  4. Rheumatoid arthritis involving multiple sites with positive rheumatoid factor ([place]) -Followed by Rheumatologist.  -She has been taking prednisone and plaquenil for years. I have advised the patient to contact her rheumatologist and let them be aware of the swelling in the face. Advised patient if her swelling gets worse to go to the emergency room.   5. Candida infection of mouth - nystatin (MYCOSTATIN) 100000 UNIT/ML suspension; Take 5 mLs (500,000 Units total) by mouth 4 (four) times daily for 14 days. Please swish and swallow  Dispense: 280 mL; Refill: 0  6. Swelling of face -will check labs and advised patient to follow up with rheumatologist as it may be due to her taking prednisone for many years. Patient verbalized understanding and will contact us or go to the ED if her swelling gets worse.   7. Chronic  kidney disease (CKD), stage IV (severe) ([place]) -Continue follow ups and will check labs today  -Followed by nephrologist  - Hemoglobin A1c - CMP14+EGFR  8. Chronic systolic CHF (congestive heart failure) ([facility]) -Chronic, stable, continue to follow up with cardiologist.  - CMP14+EGFR  The patient was encouraged to call or send a message through [facility] for any questions or concerns.   Follow up: if symptoms persist or do not get better.   Side effects and appropriate use of all the medication(s) were discussed with the patient today. Patient advised to use the medication(s) as directed by their healthcare provider. The patient was encouraged to read, review, and understand all associated package inserts and contact our office with any questions or concerns. The patient accepts the risks of the treatment plan and had an opportunity to ask questions.   Staying healthy and adopting a healthy lifestyle for your overall health is important. You should eat 7 or more servings of fruits and vegetables per day. You should drink plenty of water to keep yourself hydrated and your kidneys healthy. This includes about 65-80+ fluid ounces of water. Limit your intake of animal fats especially for elevated cholesterol. Avoid highly processed food and limit your salt intake if you have hypertension. Avoid foods high in saturated/Trans fats. Along with a healthy diet it is also very important to maintain time for yourself to maintain a healthy mental health with low stress levels. You should get atleast 150 min of moderate intensity exercise weekly for a healthy heart. Along with eating right and exercising, aim for at least 7-9 hours of sleep daily.  Eat more whole grains which includes barley, wheat berries, oats, brown rice and whole wheat pasta. Use healthy plant oils which include olive, soy, corn, sunflower and peanut. Limit your caffeine and sugary drinks. Limit your intake of fast foods. Limit milk and dairy  products to one or  two daily servings.   Patient was given opportunity to ask questions. Patient verbalized understanding of the plan and was able to repeat key elements of the plan. All questions were answered to their satisfaction.  Raman Dysen Edmondson, DNP   I, Raman Autym Siess have reviewed all documentation for this visit. The documentation on 06/04/21 for the exam, diagnosis, procedures, and orders are all accurate and complete.   IF YOU HAVE BEEN REFERRED TO A SPECIALIST, IT MAY TAKE 1-2 WEEKS TO SCHEDULE/PROCESS THE REFERRAL. IF YOU HAVE NOT HEARD FROM US/SPECIALIST IN TWO WEEKS, PLEASE GIVE [place] A CALL AT [phone].   THE PATIENT IS ENCOURAGED TO PRACTICE SOCIAL DISTANCING DUE TO THE COVID-19 PANDEMIC.

## 2021-06-04 NOTE — Patient Instructions (Signed)
Preventing Type 2 Diabetes Mellitus °Type 2 diabetes, also called type 2 diabetes mellitus, is a long-term (chronic) disease that affects sugar (glucose) levels in your blood. Normally, a hormone called insulin allows glucose to enter cells in your body. The cells use glucose for energy. With type 2 diabetes, you will have one or both of these problems: °Your pancreas does not make enough insulin. °Cells in your body do not respond properly to insulin that your body makes (insulin resistance). °Insulin resistance or lack of insulin causes extra glucose to build up in the blood instead of going into cells. As a result, high blood glucose (hyperglycemia) develops. That can cause many complications. Being overweight or obese and having an inactive (sedentary) lifestyle can increase your risk for diabetes. Type 2 diabetes can be delayed or prevented by making certain nutrition and lifestyle changes. °How can this condition affect me? °If you do not take steps to prevent diabetes, your blood glucose levels may keep increasing over time. Too much glucose in your blood for a long time can damage your blood vessels, heart, kidneys, nerves, and eyes. °Type 2 diabetes can lead to chronic health problems and complications, such as: °Heart disease. °Stroke. °Blindness. °Kidney disease. °Depression. °Poor circulation in your feet and legs. In severe cases, a foot or leg may need to be surgically removed (amputated). °What can increase my risk? °You may be more likely to develop type 2 diabetes if you: °Have type 2 diabetes in your family. °Are overweight or obese. °Have a sedentary lifestyle. °Have insulin resistance or a history of prediabetes. °Have a history of pregnancy-related (gestational) diabetes or polycystic ovary syndrome (PCOS). °What actions can I take to prevent this? °It can be difficult to recognize signs of type 2 diabetes. Taking action to prevent the disease before you develop symptoms is the best way to avoid  possible damage to your body. Making certain nutrition and lifestyle changes may prevent or delay the disease and related health problems. °Nutrition ° °Eat healthy meals and snacks regularly. Do not skip meals. Fruit or a handful of nuts is a healthy snack between meals. °Drink water throughout the day. Avoid drinks that contain added sugar, such as soda or sweetened tea. Drink enough fluid to keep your urine pale yellow. °Follow instructions from your health care provider about eating or drinking restrictions. °Limit the amount of food you eat by: °Managing how much you eat at a time (portion size). °Checking food labels for the serving sizes of food. °Using a kitchen scale to weigh amounts of food. °Sauté or steam food instead of frying it. Cook with water or broth instead of oils or butter. °Limit saturated fat and salt (sodium) in your diet. Have no more than 1 tsp (2,400 mg) of sodium a day. If you have heart disease or high blood pressure, use less than ½?¾ tsp (1,500 mg) of sodium a day. °Lifestyle ° °Lose weight if needed and as told. Your health care provider can determine how much weight loss is best for you and can help you lose weight safely. °If you are overweight or obese, you may be told to lose at least 5?7% of your body weight. °Manage blood pressure, cholesterol, and stress. Your health care provider will help determine the best treatment for you. °Do not use any products that contain nicotine or tobacco. These products include cigarettes, chewing tobacco, and vaping devices, such as e-cigarettes. If you need help quitting, ask your health care provider. °Activity ° °Do physical   activity that makes your heart beat faster and makes you sweat (moderate intensity). Do this for at least 30 minutes on at least 5 days of the week, or as much as told by your health care provider. °Ask your health care provider what activities are safe for you. A mix of activities may be best, such as walking, swimming,  cycling, and strength training. °Try to add physical activity into your day. For example: °Park your car farther away than usual so that you walk more. °Take a walk during your lunch break. °Use stairs instead of elevators or escalators. °Walk or bike to work instead of driving. °Alcohol use °If you drink alcohol: °Limit how much you have to: °0?1 drink a day for women who are not pregnant. °0?2 drinks a day for men. °Know how much alcohol is in your drink. In the [place] one drink equals one 12 oz bottle of beer (355 mL), one 5 oz glass of wine (148 mL), or one 1½ oz glass of hard liquor (44 mL). °General information °Talk with your health care provider about your risk factors and how you can reduce your risk for diabetes. °Have your blood glucose tested regularly, as told by your health care provider. °Get screening tests as told by your health care provider. You may have these regularly, especially if you have certain risk factors for type 2 diabetes. °Make an appointment with a registered dietitian. This diet and nutrition specialist can help you make a healthy eating plan and help you understand portion sizes and food labels. °Where to find support °Ask your health care provider to recommend a registered dietitian, a certified diabetes care and education specialist, or a weight loss program. °Look for local or online weight loss groups. °Join a gym, fitness club, or outdoor activity group, such as a walking club. °Where to find more information °For help and guidance and to learn more about diabetes and diabetes prevention, visit: °American Diabetes Association (ADA): www.diabetes.org °National Institute of Diabetes and Digestive and Kidney Diseases: www.niddk.nih.gov °To learn more about healthy eating, visit: °U.S. Department of Agriculture (USDA): www.choosemyplate.gov °Office of Disease Prevention and Health Promotion (ODPHP): health.gov °Summary °You can delay or prevent type 2 diabetes by eating healthy  foods, losing weight if needed, and increasing your physical activity. °Talk with your health care provider about your risk factors for type 2 diabetes and how you can reduce your risk. °It can be difficult to recognize the signs of type 2 diabetes. The best way to avoid possible damage to your body is to take action to prevent the disease before you develop symptoms. °Get screening tests as told by your health care provider. °This information is not intended to replace advice given to you by your health care provider. Make sure you discuss any questions you have with your health care provider. °Document Revised: 10/21/2020 Document Reviewed: 10/21/2020 °Elsevier Patient Education © 2022 [employer]. ° °

## 2021-06-05 LAB — CMP14+EGFR
ALT: 27 IU/L (ref 0–32)
AST: 28 IU/L (ref 0–40)
Albumin/Globulin Ratio: 1.4 (ref 1.2–2.2)
Albumin: 4 g/dL (ref 3.7–4.7)
Alkaline Phosphatase: 96 IU/L (ref 44–121)
BUN/Creatinine Ratio: 24 (ref 12–28)
BUN: 48 mg/dL — ABNORMAL HIGH (ref 8–27)
Bilirubin Total: 0.3 mg/dL (ref 0.0–1.2)
CO2: 23 mmol/L (ref 20–29)
Calcium: 9.4 mg/dL (ref 8.7–10.3)
Chloride: 103 mmol/L (ref 96–106)
Creatinine, Ser: 1.96 mg/dL — ABNORMAL HIGH (ref 0.57–1.00)
Globulin, Total: 2.8 g/dL (ref 1.5–4.5)
Glucose: 98 mg/dL (ref 70–99)
Potassium: 4.1 mmol/L (ref 3.5–5.2)
Sodium: 143 mmol/L (ref 134–144)
Total Protein: 6.8 g/dL (ref 6.0–8.5)
eGFR: 26 mL/min/{1.73_m2} — ABNORMAL LOW (ref >59)

## 2021-06-05 LAB — HEMOGLOBIN A1C
Est. average glucose Bld gHb Est-mCnc: 137 mg/dL
Hgb A1c MFr Bld: 6.4 % — ABNORMAL HIGH (ref 4.8–5.6)

## 2021-06-06 LAB — URINE CYTOLOGY ANCILLARY ONLY
Candida Urine: POSITIVE — AB
Comment: NEGATIVE
Trichomonas: NEGATIVE

## 2021-06-09 DIAGNOSIS — I5022 Chronic systolic (congestive) heart failure: Secondary | ICD-10-CM | POA: Diagnosis not present

## 2021-06-09 DIAGNOSIS — I10 Essential (primary) hypertension: Secondary | ICD-10-CM

## 2021-06-10 ENCOUNTER — Other Ambulatory Visit: Payer: Self-pay | Admitting: Nurse Practitioner

## 2021-06-11 ENCOUNTER — Other Ambulatory Visit (HOSPITAL_COMMUNITY): Payer: Self-pay | Admitting: Cardiology

## 2021-06-30 ENCOUNTER — Telehealth: Payer: Self-pay

## 2021-06-30 NOTE — Chronic Care Management (AMB) (Signed)
  Tracy Nelson was reminded to have all medications, supplements and any blood glucose and blood pressure readings available for review with [name], Pharm. D, at her telephone visit on 07-01-2021 at 11:00   [place] Pharmacist Assistant [phone]

## 2021-07-01 ENCOUNTER — Ambulatory Visit (INDEPENDENT_AMBULATORY_CARE_PROVIDER_SITE_OTHER): Payer: Medicare PPO

## 2021-07-01 DIAGNOSIS — I10 Essential (primary) hypertension: Secondary | ICD-10-CM

## 2021-07-01 DIAGNOSIS — N184 Chronic kidney disease, stage 4 (severe): Secondary | ICD-10-CM

## 2021-07-01 NOTE — Progress Notes (Signed)
Chronic Care Management Pharmacy Note  07/01/2021 Name:  Tracy Nelson MRN:  [id number] DOB:  07-05-1946  Summary: Patient reports that she is not sure about getting the COVID-19 vaccine Patient reported a craving for grapefruit juice.   Recommendations/Changes made from today's visit: Recommend patient receive COVID-19 vaccine booster. Recommend patient choose another juice to drink besides grapefruit juice due to drug interactions.   Plan: Patient is not ready to receive COVID-19 vaccine. Patient reports that she is going to choose a different juice to drink.    Subjective: Tracy Nelson is an 75 y.o. year old female who is a primary patient of Ghumman, Ramandeep, NP.  The CCM team was consulted for assistance with disease management and care coordination needs.    Engaged with patient by telephone for follow up visit in response to provider referral for pharmacy case management and/or care coordination services.   Consent to Services:  The patient was given information about Chronic Care Management services, agreed to services, and gave verbal consent prior to initiation of services.  Please see initial visit note for detailed documentation.   Patient Care Team: Bary Castilla, NP as PCP - General (Nurse Practitioner) Jorge Ny, LCSW as Social Worker (Licensed Clinical Social Worker) Mayford Knife, [facility] (Pharmacist)  Recent office visits: 06/04/2021 PCP OV  Recent consult visits: 02/26/2021 Oncology OV 02/12/2021 Cardiology [facility] visits: None in previous 6 months   Objective:  Lab Results  Component Value Date   CREATININE 1.96 (H) 06/04/2021   BUN 48 (H) 06/04/2021   GFRNONAA 24 (L) 02/26/2021   NA 143 06/04/2021   K 4.1 06/04/2021   CALCIUM 9.4 06/04/2021   CO2 23 06/04/2021   GLUCOSE 98 06/04/2021    Lab Results  Component Value Date/Time   HGBA1C 6.4 (H) 06/04/2021 12:40 PM   HGBA1C 6.3 (H) 02/13/2021 09:59 AM    Last diabetic Eye  exam: No results found for: HMDIABEYEEXA  Last diabetic Foot exam: No results found for: HMDIABFOOTEX   Lab Results  Component Value Date   CHOL 193 02/13/2021   HDL 96 02/13/2021   LDLCALC 72 02/13/2021   TRIG 152 (H) 02/13/2021   CHOLHDL 2.0 02/13/2021    Hepatic Function Latest Ref Rng & Units 06/04/2021 02/26/2021 11/12/2020  Total Protein 6.0 - 8.5 g/dL 6.8 7.0 7.1  Albumin 3.7 - 4.7 g/dL 4.0 3.5 3.7  AST 0 - 40 IU/L _0 ALT 0 - 32 IU/L _1 Alk Phosphatase 44 - 121 IU/L 96 83 64  Total Bilirubin 0.0 - 1.2 mg/dL 0.3 0.5 0.8  Bilirubin, Direct 0.0 - 0.2 mg/dL - - -    Lab Results  Component Value Date/Time   TSH 2.426 11/05/2020 04:38 PM   TSH 3.322 10/21/2020 08:52 AM    CBC Latest Ref Rng & Units 02/26/2021 02/13/2021 11/05/2020  WBC 4.0 - 10.5 K/uL 8.3 8.4 7.8  Hemoglobin 12.0 - 15.0 g/dL 11.7(L) 11.8 11.6(L)  Hematocrit 36.0 - 46.0 % 37.3 35.5 36.9  Platelets 150 - 400 K/uL 262 264 291    No results found for: VD25OH  Clinical ASCVD: Yes  The 10-year ASCVD risk score (Arnett DK, et al., 2019) is: 16.6%   Values used to calculate the score:     Age: 75 years     Sex: Female     Is Non-Hispanic African American: Yes     Diabetic: No     Tobacco smoker: No  Systolic Blood Pressure: 893 mmHg     Is BP treated: Yes     HDL Cholesterol: 96 mg/dL     Total Cholesterol: 193 mg/dL    Depression screen [facility] 2/9 04/10/2021 12/06/2020 11/14/2020  Decreased Interest 0 0 0  Down, Depressed, Hopeless 0 0 0  PHQ - 2 Score 0 0 0  Altered sleeping - - 0  Tired, decreased energy - - 0  Change in appetite - - 0  Feeling bad or failure about yourself  - - 0  Trouble concentrating - - 0  Moving slowly or fidgety/restless - - 0  Suicidal thoughts - - 0  PHQ-9 Score - - 0    Social History   Tobacco Use  Smoking Status Never  Smokeless Tobacco Never   BP Readings from Last 3 Encounters:  06/04/21 120/70  03/10/21 93/72  02/26/21 91/71   Pulse Readings from  Last 3 Encounters:  06/04/21 84  03/10/21 87  02/26/21 79   Wt Readings from Last 3 Encounters:  06/04/21 107 lb 3.2 oz (48.6 kg)  04/10/21 105 lb (47.6 kg)  03/10/21 105 lb 3.2 oz (47.7 kg)   BMI Readings from Last 3 Encounters:  06/04/21 19.73 kg/m  04/10/21 18.02 kg/m  03/10/21 19.00 kg/m    Assessment/Interventions: Review of patient past medical history, allergies, medications, health status, including review of consultants reports, laboratory and other test data, was performed as part of comprehensive evaluation and provision of chronic care management services.   SDOH:  (Social Determinants of Health) assessments and interventions performed: No  SDOH Screenings   Alcohol Screen: Not on file  Depression (PHQ2-9): Low Risk    PHQ-2 Score: 0  Financial Resource Strain: High Risk   Difficulty of Paying Living Expenses: Very hard  Food Insecurity: No Food Insecurity   Worried About Charity fundraiser in the Last Year: Never true   Ran Out of Food in the Last Year: Never true  Housing: Low Risk    Last Housing Risk Score: 0  Physical Activity: Inactive   Days of Exercise per Week: 0 days   Minutes of Exercise per Session: 0 min  Social Connections: Not on file  Stress: Not on file  Tobacco Use: Low Risk    Smoking Tobacco Use: Never   Smokeless Tobacco Use: Never   Passive Exposure: Not on file  Transportation Needs: No Transportation Needs   Lack of Transportation (Medical): No   Lack of Transportation (Non-Medical): No    CCM Care Plan  Allergies  Allergen Reactions   Baclofen Nausea And Vomiting   Penicillins Rash    Medications Reviewed Today     Reviewed by Bary Castilla, NP (Nurse Practitioner) on 06/04/21 at 1238  Med List Status: <None>   Medication Order Taking? Sig Documenting Provider Last Dose Status Informant  acetaminophen (TYLENOL) 500 MG tablet [id number] No Take 500-1,000 mg by mouth every 6 (six) hours as needed for moderate pain.  [provider] Taking Active Self  amiodarone (PACERONE) 200 MG tablet [id number] No Take 1 tablet (200 mg total) by mouth daily. [name], MD Taking Active   azaTHIOprine (IMURAN) 50 MG tablet [id number] No Take 50 mg by mouth daily. [provider] Taking Active   bumetanide (BUMEX) 1 MG tablet [id number] No Take 2 mg by mouth in the morning. 2 MG IN AM/1 MG IN PM [provider] Taking Active   bumetanide (BUMEX) 1 MG tablet [id number] No Take 1 mg  by mouth every evening. 2 MG am/1 MG PM [provider] Taking Active   cholecalciferol (VITAMIN D3) 25 MCG (1000 UNIT) tablet [id number] No Take 1,000 Units by mouth daily. [provider] Taking Active Self  cholestyramine (QUESTRAN) 4 g packet [id number] No Take 1 packet (4 g total) by mouth daily. [name], MD Taking Active Self  cycloSPORINE (RESTASIS) 0.05 % ophthalmic emulsion [id number] No Place 1 drop into both eyes 2 (two) times daily. Bary Castilla, NP Taking Active   ELIQUIS 2.5 MG TABS tablet [id number]  TAKE 1 TABLET BY MOUTH TWICE A DAY [name], MD  Active   Ensure [facility]) [id number] No Take 237 mLs by mouth 2 (two) times daily between meals. [provider] Taking Active Self  FARXIGA 10 MG TABS tablet [id number] No TAKE 1 TABLET BY MOUTH DAILY BEFORE BREAKFAST. [name], MD Taking Active   folic acid (FOLVITE) 1 MG tablet [id number] No TAKE 1 TABLET BY MOUTH EVERY DAY [name], MD Taking Active   hydroxychloroquine (PLAQUENIL) 200 MG tablet [id number] No TAKE 1 TABLET BY MOUTH EVERY DAY [name], MD Taking Active   Menthol, Topical Analgesic, (ICY HOT BACK EX) [id number] No Apply 1 application topically daily as needed (pain). [provider] Taking Active Self  MITIGARE 0.6 MG CAPS [id number] No Take 0.6 mg by mouth daily as needed (gout). [provider] Taking Active Self  Multiple Vitamin (MULTIVITAMIN) tablet [id number]  No Take 1 tablet by mouth daily. [provider] Taking Active Self  nystatin (MYCOSTATIN) 100000 UNIT/ML suspension [id number]  Take 5 mLs (500,000 Units total) by mouth 4 (four) times daily for 14 days. Please swish and swallow Ghumman, Ramandeep, NP  Active   omeprazole (PRILOSEC) 40 MG capsule [id number] No Take 40 mg by mouth daily. [provider] Taking Active Self  polyethylene glycol (MIRALAX / GLYCOLAX) 17 g packet [id number] No Take 17 g by mouth as needed. [provider] Taking Active Self  predniSONE (DELTASONE) 5 MG tablet [id number]  TAKE 1 TABLET BY MOUTH EVERY DAY WITH BREAKFAST [name], MD  Active   Vericiguat (VERQUVO) 10 MG TABS [id number] No Take 10 mg by mouth daily. [name], MD Taking Active Self            Patient Active Problem List   Diagnosis Date Noted   Monoclonal gammopathy 03/10/2021   Hypotension 10/22/2020   Atypical chest pain 10/22/2020   CKD (chronic kidney disease), stage III ([place]) 10/22/2020   Nausea & vomiting 10/22/2020   Physical deconditioning 10/22/2020   Atrial flutter ([place]) 10/18/2020   Arthritis 07/15/2020   GERD (gastroesophageal reflux disease) 07/15/2020   Colitis 07/15/2020   Non-ischemic cardiomyopathy ([place]) 07/15/2020   HFrEF (heart failure with reduced ejection fraction) ([place]) 07/15/2020   HTN (hypertension) 07/15/2020   Osteoporosis 07/15/2020   Palpitations 07/15/2020   Acute gout of left shoulder 07/10/2020   Rheumatoid arthritis involving multiple sites with positive rheumatoid factor ([place]) 10/28/2015   Mitral regurgitation 05/30/2014   Acute on chronic combined systolic (congestive) and diastolic (congestive) heart failure ([place]) 05/19/2014   Vitamin D deficiency 05/15/2013   Encounter for long-term (current) use of other medications 12/14/2008   Sjogren's syndrome ([place]) 12/14/2008     There is no immunization history on file for this patient.  Conditions to be  addressed/monitored:  Hypertension and Hyperlipidemia  Care Plan : [place]  Updates made by Mayford Knife, RPH since  07/02/2021 12:00 AM     Problem: HTN, CKD      Goal: Disease Management   Note:   Current Barriers:  Unable to independently monitor therapeutic efficacy  Pharmacist Clinical Goal(s):  Patient will achieve adherence to monitoring guidelines and medication adherence to achieve therapeutic efficacy through collaboration with PharmD and provider.   Interventions: 1:1 collaboration with Bary Castilla, NP regarding development and update of comprehensive plan of care as evidenced by provider attestation and co-signature Inter-disciplinary care team collaboration (see longitudinal plan of care) Comprehensive medication review performed; medication list updated in electronic medical record  Hypertension (BP goal <130/80) -Controlled -Current treatment: Vericiguat 10 mg tablet -once per day Amiodarone 200 mg tablet by mouth aily Bumex 1 mg - 2 mg in the morning and 1 mg in the evening  -Current home readings: 110/60, 90/70  -Current dietary habits: she is limiting her salt  -Current exercise habits: patient reports that she is walking around the house  -Denies hypotensive/hypertensive symptoms -Educated on Importance of home blood pressure monitoring; Proper BP monitoring technique; Symptoms of hypotension and importance of maintaining adequate hydration; -Counseled to monitor BP at home at l, document, and provide log at future appointments -Recommended to continue current medication  Chronic Kidney Disease (Goal: reduce issues with CKD) -Controlled -Current treatment  Farxiga 10 mg tablet once per day  -Patiente is not currently taking any medication that requires changes to renal dosing.  -Recommended to continue current medication  Health Maintenance -Vaccine gaps: COVID-19  -Current therapy:  Patient had a question about grapefruit  juice and her medications.  I reviewed all of her medication with grapefruit juice, confirmed that Amiodarone, Cyclosporine, and Mitigare have interactions.  -Educated on the medications that have an interaction with Grapefruit. Patient voiced understanding.  -Counseled on drug interactions.     Patient Goals/Self-Care Activities Patient will:  - take medications as prescribed as evidenced by patient report and record review  Follow Up Plan: The patient has been provided with contact information for the care management team and has been advised to call with any health related questions or concerns.       Medication Assistance: None required.  Patient affirms current coverage meets needs.  Compliance/Adherence/Medication fill history: Care Gaps: COVID-19 Vaccine  Star-Rating Drugs: Farxiga 10 mg tablet   Patient's preferred pharmacy is:  [id number] [place] [address]Phone: [phone]: [phone] [facility] [address]: [phone]: [phone]  [id number] [place], [place] [address]: [phone]: [phone] pill box? Yes Pt endorses 90% compliance  We discussed: Benefits of medication synchronization, packaging and delivery as well as enhanced pharmacist oversight with [employer]. Patient decided to: Continue current medication management strategy  Care Plan and Follow Up Patient Decision:  Patient agrees to Care Plan and Follow-up.  Plan: The patient has been provided with contact information for the care management team and has been advised to call with any health related questions or concerns.   VaOrlando PennerCPP, PharmD Clinical Pharmacist Practitioner [facility] [phone]

## 2021-07-02 NOTE — Patient Instructions (Addendum)
Visit Information It was great speaking with you today!  Please let me know if you have any questions about our visit.   Goals Addressed             This Visit's Progress    Manage My Medicine       Timeframe:  Long-Range Goal Priority:  High Start Date:                             Expected End Date:                       Follow Up Date 09/25/2021   - call for medicine refill 2 or 3 days before it runs out - call if I am sick and can't take my medicine - keep a list of all the medicines I take; vitamins and herbals too - use a pillbox to sort medicine    Why is this important?   These steps will help you keep on track with your medicines.   Notes:  Please call with any questions.         Patient Care Plan: CCM Pharmacy Care Plan     Problem Identified: HTN, CKD      Goal: Disease Management   Note:   Current Barriers:  Unable to independently monitor therapeutic efficacy  Pharmacist Clinical Goal(s):  Patient will achieve adherence to monitoring guidelines and medication adherence to achieve therapeutic efficacy through collaboration with PharmD and provider.   Interventions: 1:1 collaboration with Bary Castilla, NP regarding development and update of comprehensive plan of care as evidenced by provider attestation and co-signature Inter-disciplinary care team collaboration (see longitudinal plan of care) Comprehensive medication review performed; medication list updated in electronic medical record  Hypertension (BP goal <130/80) -Controlled -Current treatment: Vericiguat 10 mg tablet -once per day Amiodarone 200 mg tablet by mouth aily Bumex 1 mg - 2 mg in the morning and 1 mg in the evening  -Current home readings: 110/60, 90/70  -Current dietary habits: she is limiting her salt  -Current exercise habits: patient reports that she is walking around the house  -Denies hypotensive/hypertensive symptoms -Educated on Importance of home blood pressure  monitoring; Proper BP monitoring technique; Symptoms of hypotension and importance of maintaining adequate hydration; -Counseled to monitor BP at home at l, document, and provide log at future appointments -Recommended to continue current medication  Chronic Kidney Disease (Goal: reduce issues with CKD) -Controlled -Current treatment  Farxiga 10 mg tablet once per day  -Patiente is not currently taking any medication that requires changes to renal dosing.  -Recommended to continue current medication  Health Maintenance -Vaccine gaps: COVID-19  -Current therapy:  Patient had a question about grapefruit juice and her medications.  I reviewed all of her medication with grapefruit juice, confirmed that Amiodarone, Cyclosporine, and Mitigare have interactions.  -Educated on the medications that have an interaction with Grapefruit. Patient voiced understanding.  -Counseled on drug interactions.    Patient Goals/Self-Care Activities Patient will:  - take medications as prescribed as evidenced by patient report and record review  Follow Up Plan: The patient has been provided with contact information for the care management team and has been advised to call with any health related questions or concerns.       Patient agreed to services and verbal consent obtained.   The patient verbalized understanding of instructions, educational materials, and care plan provided today and  agreed to receive a mailed copy of patient instructions, educational materials, and care plan.   Tracy Nelson, PharmD Clinical Pharmacist [facility] [phone]

## 2021-07-07 ENCOUNTER — Telehealth (HOSPITAL_COMMUNITY): Payer: Self-pay | Admitting: *Deleted

## 2021-07-07 NOTE — Telephone Encounter (Signed)
Error

## 2021-07-09 DIAGNOSIS — I129 Hypertensive chronic kidney disease with stage 1 through stage 4 chronic kidney disease, or unspecified chronic kidney disease: Secondary | ICD-10-CM

## 2021-07-09 DIAGNOSIS — N183 Chronic kidney disease, stage 3 unspecified: Secondary | ICD-10-CM | POA: Diagnosis not present

## 2021-07-18 ENCOUNTER — Encounter (HOSPITAL_COMMUNITY): Payer: Self-pay | Admitting: Cardiology

## 2021-07-18 ENCOUNTER — Other Ambulatory Visit: Payer: Self-pay

## 2021-07-18 ENCOUNTER — Ambulatory Visit (HOSPITAL_COMMUNITY)
Admission: RE | Admit: 2021-07-18 | Discharge: 2021-07-18 | Disposition: A | Discharge status: Home / Self Care | Payer: Medicare PPO | Source: Ambulatory Visit | Attending: Cardiology | Admitting: Cardiology

## 2021-07-18 VITALS — BP 88/60 | HR 82 | Wt 108.0 lb

## 2021-07-18 DIAGNOSIS — N184 Chronic kidney disease, stage 4 (severe): Secondary | ICD-10-CM | POA: Diagnosis not present

## 2021-07-18 DIAGNOSIS — Z7952 Long term (current) use of systemic steroids: Secondary | ICD-10-CM | POA: Insufficient documentation

## 2021-07-18 DIAGNOSIS — I428 Other cardiomyopathies: Secondary | ICD-10-CM | POA: Insufficient documentation

## 2021-07-18 DIAGNOSIS — I43 Cardiomyopathy in diseases classified elsewhere: Secondary | ICD-10-CM | POA: Diagnosis not present

## 2021-07-18 DIAGNOSIS — Z79899 Other long term (current) drug therapy: Secondary | ICD-10-CM | POA: Diagnosis not present

## 2021-07-18 DIAGNOSIS — I4892 Unspecified atrial flutter: Secondary | ICD-10-CM | POA: Diagnosis not present

## 2021-07-18 DIAGNOSIS — M069 Rheumatoid arthritis, unspecified: Secondary | ICD-10-CM | POA: Insufficient documentation

## 2021-07-18 DIAGNOSIS — I5022 Chronic systolic (congestive) heart failure: Secondary | ICD-10-CM | POA: Insufficient documentation

## 2021-07-18 DIAGNOSIS — Z7901 Long term (current) use of anticoagulants: Secondary | ICD-10-CM | POA: Diagnosis not present

## 2021-07-18 DIAGNOSIS — D472 Monoclonal gammopathy: Secondary | ICD-10-CM | POA: Diagnosis not present

## 2021-07-18 DIAGNOSIS — E854 Organ-limited amyloidosis: Secondary | ICD-10-CM | POA: Insufficient documentation

## 2021-07-18 DIAGNOSIS — I4891 Unspecified atrial fibrillation: Secondary | ICD-10-CM | POA: Diagnosis not present

## 2021-07-18 DIAGNOSIS — Z8249 Family history of ischemic heart disease and other diseases of the circulatory system: Secondary | ICD-10-CM | POA: Insufficient documentation

## 2021-07-18 DIAGNOSIS — I491 Atrial premature depolarization: Secondary | ICD-10-CM | POA: Diagnosis not present

## 2021-07-18 DIAGNOSIS — I5020 Unspecified systolic (congestive) heart failure: Secondary | ICD-10-CM | POA: Diagnosis present

## 2021-07-18 LAB — TSH: TSH: 1.463 u[IU]/mL (ref 0.350–4.500)

## 2021-07-18 LAB — COMPREHENSIVE METABOLIC PANEL
ALT: 23 U/L (ref 0–44)
AST: 29 U/L (ref 15–41)
Albumin: 2.9 g/dL — ABNORMAL LOW (ref 3.5–5.0)
Alkaline Phosphatase: 75 U/L (ref 38–126)
Anion gap: 8 (ref 5–15)
BUN: 31 mg/dL — ABNORMAL HIGH (ref 8–23)
CO2: 25 mmol/L (ref 22–32)
Calcium: 8.7 mg/dL — ABNORMAL LOW (ref 8.9–10.3)
Chloride: 102 mmol/L (ref 98–111)
Creatinine, Ser: 1.78 mg/dL — ABNORMAL HIGH (ref 0.44–1.00)
GFR, Estimated: 29 mL/min — ABNORMAL LOW (ref >60)
Glucose, Bld: 103 mg/dL — ABNORMAL HIGH (ref 70–99)
Potassium: 4.3 mmol/L (ref 3.5–5.1)
Sodium: 135 mmol/L (ref 135–145)
Total Bilirubin: 0.2 mg/dL — ABNORMAL LOW (ref 0.3–1.2)
Total Protein: 6.4 g/dL — ABNORMAL LOW (ref 6.5–8.1)

## 2021-07-18 LAB — BRAIN NATRIURETIC PEPTIDE: B Natriuretic Peptide: 548.9 pg/mL — ABNORMAL HIGH (ref 0.0–100.0)

## 2021-07-18 NOTE — Patient Instructions (Signed)
Medication Changes:  No Change  Lab Work:  Labs done today, your results will be available in MyChart, we will contact you for abnormal readings.   Testing/Procedures:  Genetic test has been done, this has to be sent to [place] to be processed and can take 1-2 weeks to get results back.  We will let you know the results.  You have been ordered a PYP Scan.  This is done in the Radiology Department of [facility].  When you come for this test please plan to be there 2-3 hours. ONCE APPROVED BY INSURANCE WE WILL CALL FOR APPOINTMENT.  Referrals:    Special Instructions // Education:    Follow-Up in: 6 WEEKS  At the [facility], you and your health needs are our priority. We have a designated team specialized in the treatment of Heart Failure. This Care Team includes your primary Heart Failure Specialized Cardiologist (physician), Advanced Practice Providers (APPs- Physician Assistants and Nurse Practitioners), and Pharmacist who all work together to provide you with the care you need, when you need it.   You may see any of the following providers on your designated Care Team at your next follow up:  Dr Glori Bickers Dr Haynes Kerns, NP Lyda Jester, [place] [facility] [place], [place] Audry Riles, PharmD   Please be sure to bring in all your medications bottles to every appointment.   Need to Contact us:  If you have any questions or concerns before your next appointment please send [place] a message through [place] or call our office at [phone].    TO LEAVE A MESSAGE FOR THE NURSE SELECT OPTION 2, PLEASE LEAVE A MESSAGE INCLUDING: YOUR NAME DATE OF BIRTH CALL BACK NUMBER REASON FOR CALL**this is important as we prioritize the call backs  YOU WILL RECEIVE A CALL BACK THE SAME DAY AS LONG AS YOU CALL BEFORE 4:00 PM

## 2021-07-20 NOTE — Progress Notes (Signed)
Cardiology: Dr. Aundra Dubin  75 y.o. with history of rheumatoid arthritis, CKD stage 3, and nonischemic cardiomyopathy was referred by Dr. Andrey Cota in [place] to establish heart failure care in [place].  Patient has been known to have a cardiomyopathy since 2015.  Cardiac MRI in 2015 showed EF 37% with no LGE.  Coronary angiography at that time showed no significant coronary disease.  Over the next few years, LV EF fluctuated up and down.  In 3/21, echo showed EF down to 20-25%.  She was admitted to the hospital in [place], [place] in 11/21 with CHF exacerbation.  Echo showed EF 20-25%, global hypokinesis, mildly decreased RV systolic function.  Creatinine was noted to be elevated. She was diuresed and sent home, but was readmitted later in 11/21 with ongoing volume overload. Creatinine was up to 2.89.  She was taken off most of her cardiac meds due to soft BP and elevated creatinine. She was discharged to her daughter's house in [place].   I repeated an echo on her in 12/21, EF < 20% with moderate LV dilation and mildly decreased RV systolic function. RHC was done, surprisingly showing low filling pressures and relatively preserved cardiac index at 2.22.   She was admitted in 3/22 with atrial fibrillation, converted to NSR on amiodarone. She went into atrial flutter after this and had TEE-DCCV in 4/22.  TEE showed EF < 20%, moderate LV dilation, mild RV dilation with moderate RV dysfunction, moderate central MR.  CPX in 3/22 showed severe HF limitation.  PYP scan 4/22 was grade 1 with H/CL 1.48, equivocal.    She was evaluated for [facility] or ANTHEM, BNP too high and thought to be too frail.   Cardiac MRI in 10/22 showed mild LV dilation with EF 23%, normal RV size with RVEF 37%, ECV 34%, small area of subendocardial LGE in the mid inferolateral wall and small area of full thickness LGE in the basal inferior wall.   Patient returns today for followup of CHF. She is in NSR today.  Weight up 2 lbs.   Poor energy level.  Rare atypical chest pain.  No lightheadedness but poor balance.  Short of breath if she tries to walk fast. Generally, does ok walking slowly on flat ground.  No orthiopnea/PND.    ECG (personally reviewed): NSR, PACs, LAFB, poor RWP   Labs (12/21): K 5 => 3.7, creatinine 2.89 => 1.9 => 1.69, BNP 2904 Labs (1/22): K 4, creatinine 1.9, hgb 12.5 Labs (3/22): K 3.6, creatinine 2.36, hgb 11.6, TSH normal Labs (4/22): K 3.7, creatinine 2.25, LFTs normal, urine immunofixation normal, pro-BNP 6498 Labs (7/22): Myeloma panel with IgG monoclonal protein Labs (10/22): K 4.1, creatinine 1.96  PMH: 1. GERD 2. Rheumatoid arthritis: Followed by rheumatology in [place].  - No known pulmonary disease from RA.  3. Osteoporosis. 4. Gout 5. CKD stage 4 6. H/o SVT 7. Chronic systolic CHF: She was found to have nonischemic cardiomyopathy with low EF in 2015.  - LHC (2015): Normal coronaries.  - cardiac MRI (2015): EF 37%, no LGE - Echo (3/21): EF 20-25%. - Echo (11/21): EF 20-25%, moderate LV dilation, global hypokinesis, biatrial enlargement, mildly decreased RV systolic function.  - Echo (12/21): EF < 20%, moderate LV dilation, normal RV size/systolic function, moderate MR, IVC normal.  - RHC (1/22): Mean RA 1, PA 39/14, mean PCWP 10, PVR 3.9 WU, CI 2.22.  - CPX (3/22): peak VO2 10.1, VE/VCO2 55, RER 1.13.  Severe functional limitation due to HF.  - TEE (  4/22): EF < 20%, moderate LV dilation, mild RV dilation with moderate RV dysfunction, moderate central MR. - PYP scan (4/22): grade 1 with H/CL 1.48. Equivocal.  - Cardiac MRI (10/22): mild LV dilation with EF 23%, normal RV size with RVEF 37%, ECV 34%, small area of subendocardial LGE in the mid inferolateral wall and small area of full thickness LGE in the basal inferior wall.  8. Atrial fibrillation/flutter: TEE-DCCV in 4/22.  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   SH: From [place] but recently moved  to [place] to live with daughter.  Has 3 children.  Nonsmoker.  No ETOH.   FH: Mother with CHF, father with MI.   ROS: All systems reviewed and negative except as per HPI.   Current Outpatient Medications  Medication Sig Dispense Refill   acetaminophen (TYLENOL) 500 MG tablet Take 500-1,000 mg by mouth every 6 (six) hours as needed for moderate pain.     amiodarone (PACERONE) 200 MG tablet Take 1 tablet (200 mg total) by mouth daily. 60 tablet 6   azaTHIOprine (IMURAN) 50 MG tablet Take 50 mg by mouth daily.     bumetanide (BUMEX) 1 MG tablet Take 2 mg by mouth in the morning. 2 MG IN AM/1 MG IN PM     Cholecalciferol (VITAMIN D-3 PO) Take 25 mcg by mouth once a week.     cholestyramine (QUESTRAN) 4 g packet Take 1 packet (4 g total) by mouth daily. 60 each 12   cycloSPORINE (RESTASIS) 0.05 % ophthalmic emulsion Place 1 drop into both eyes 2 (two) times daily. 1.5 mL 2   ELIQUIS 2.5 MG TABS tablet TAKE 1 TABLET BY MOUTH TWICE A DAY 60 tablet 6   Ensure (ENSURE) Take 237 mLs by mouth 2 (two) times daily between meals.     FARXIGA 10 MG TABS tablet TAKE 1 TABLET BY MOUTH DAILY BEFORE BREAKFAST. 30 tablet 5   folic acid (FOLVITE) 1 MG tablet TAKE 1 TABLET BY MOUTH EVERY DAY 30 tablet 3   hydroxychloroquine (PLAQUENIL) 200 MG tablet TAKE 1 TABLET BY MOUTH EVERY DAY 30 tablet 11   Menthol, Topical Analgesic, (ICY HOT BACK EX) Apply 1 application topically daily as needed (pain).     MITIGARE 0.6 MG CAPS Take 0.6 mg by mouth daily as needed (gout).     Multiple Vitamin (MULTIVITAMIN) tablet Take 1 tablet by mouth daily.     omeprazole (PRILOSEC) 40 MG capsule Take 40 mg by mouth daily.     polyethylene glycol (MIRALAX / GLYCOLAX) 17 g packet Take 17 g by mouth as needed.     predniSONE (DELTASONE) 5 MG tablet TAKE 1 TABLET BY MOUTH EVERY DAY WITH BREAKFAST 30 tablet 3   Vericiguat (VERQUVO) 10 MG TABS Take 10 mg by mouth daily. 30 tablet 11   No current facility-administered medications for  this encounter.   BP (!) 88/60   Pulse 82   Wt 49 kg (108 lb)   SpO2 98%   BMI 19.88 kg/m  General: NAD, frail Neck: No JVD, no thyromegaly or thyroid nodule.  Lungs: Clear to auscultation bilaterally with normal respiratory effort. CV: Nondisplaced PMI.  Heart regular S1/S2, no S3/S4, no murmur.  No peripheral edema.  No carotid bruit.  Normal pedal pulses.  Abdomen: Soft, nontender, no hepatosplenomegaly, no distention.  Skin: Intact without lesions or rashes.  Neurologic: Alert and oriented x 3.  Psych: Normal affect. Extremities: No clubbing or cyanosis.  HEENT: Normal.  Assessment/Plan: 1. Chronic systolic CHF: Nonischemic cardiomyopathy.  This has been known since 2015, cath in 2015 showed no coronary disease and cardiac MRI in 2015 showed no LGE.  Cause is uncertain, familial cardiomyopathy is a concern given nonischemic cardiomyopathy in her mother.  Cannot rule out remote viral myocarditis. She remains quite limited, primarily by fatigue, NYHA class IIIb.  Poor appetite and severe HF limitation on CPX in 3/22 is concerning, as is elevated creatinine (suspect cardiorenal syndrome).  Low BP and elevated creatinine have limited her cardiac med regimen. RHC (1/22) showed low filling pressures and actually a relatively preserved cardiac index of 2.22.  PYP scan in 4/22 was equivocal.  Cardiac MRI in 10/22 showed mild LV dilation with EF 23%, normal RV size with RVEF 37%, ECV 34%, small area of subendocardial LGE in the mid inferolateral wall and small area of full thickness LGE in the basal inferior wall.   This was not suggestive of cardiac amyloidosis but possibly could be consistent with coronary embolism (though no LV thrombus visualized versus prior myocarditis versus sarcoidosis. She is not volume overloaded on exam today, chronic NYHA class III symptoms.  No BP room for GDMT titration today.  - Continue bumetanide 2 mg qam/1 mg qpm. BMET today.  - Continue Verquvo 10 mg daily.  -  Continue dapagliflozin 10 mg daily.  - Off spironolactone with recent rise in creatinine and soft BP.  - Though RHC was not markedly abnormal, CPX showed severe functional limitation due to HF.  I am worried that she is near end-stage.  She is frail with CKD stage 4, I do not think that she would be a candidate for LVAD.  - With renal failure, CHF, and atrial arrhythmias, we have assessed for cardiac amyloidosis.  PYP scan was equivocal in 4/22 for transthyretin cardiac amyloidosis, I will arrange for repeat PYP scan.  The cardiac MRI actually was not consistent with amyloidosis.  I will also arrange for [employer] gene testing for transthyretin mutation.  She has MGUS with monoclonal IgG paraprotein, but based on cMRI and slow progression, think AL amyloidosis is unlikely.   - Thought to be too frail with HF too advanced for barostimulation activation therapy or vagal nerve stimulation ([place] and ANTHEM trial).  - Does not have an ICD, would not place given NICM, advanced age, and concern for nearing end stage HF.  2. CKD: Stage 4.   - BMET today.  3. Rheumatoid arthritis: No history of lung involvement. She has been on a low dose of prednisone chronically.  4. Atrial fibrillation/flutter: S/p TEE-DCCV in 4/22.  She is now on amiodarone and in NSR.  - Continue amiodarone 200 mg daily. Check LFTs and TSH.  Will need regular eye exam.  - Continue Eliquis 5 mg bid.   Followup in 6 wks with APP   Loralie Champagne 07/20/2021

## 2021-07-25 ENCOUNTER — Other Ambulatory Visit (HOSPITAL_COMMUNITY): Payer: Self-pay | Admitting: Cardiology

## 2021-07-29 ENCOUNTER — Other Ambulatory Visit (HOSPITAL_COMMUNITY): Payer: Self-pay | Admitting: Cardiology

## 2021-08-13 ENCOUNTER — Telehealth (HOSPITAL_COMMUNITY): Payer: Self-pay | Admitting: Pharmacy Technician

## 2021-08-13 ENCOUNTER — Other Ambulatory Visit (HOSPITAL_COMMUNITY): Payer: Self-pay

## 2021-08-13 NOTE — Telephone Encounter (Signed)
Advanced Heart Failure Patient Advocate Encounter  Spoke with patient regarding Verquvo assistance renewal with [employer]. Sent docusign link for patient's signature. Was able to call [facility] and get last 30 day shipment sent to the patient. She attempted to call before and was told her assistance had expired but it had not at that time. She will receive the shipment tomorrow.  Advanced Heart Failure Patient Advocate Encounter  Prior Authorization for [facility] has been submitted and approved.    PA#  [id number] Effective dates: 08/10/21 through 08/09/22  Patients co-pay is $64 (30 days)

## 2021-08-19 NOTE — Progress Notes (Signed)
Office Visit Note  Patient: Tracy Nelson             Date of Birth: [birth year]           MRN: [id number]             PCP: [name], [place] Referring: Bary Castilla, NP Visit Date: 08/20/2021   Subjective:  Follow-up (Left eye ulcers, side effect from medication)   History of Present Illness: [name] is a 76 y.o. female here for follow up for seronegative RA on HCQ 200 mg daily and prednisone 5 mg daily. Labs checked at hospital December 2022 with eGFR stable at 29. Joint symptoms are not significantly changed since last visit. However she saw ophthalmology in December with increased eye irritation and redness found to have ulceration in the left eye consistent with active RA. She also continued restasis and artificial tears for severe dryness. She reports seeing some darkening or rash on her face does not have any abnormal sensation.  Previous HPI 01/09/21 [name] is a 76 y.o. female here for seronegative rheumatoid arthritis previously a patient with Dr. Francee Nodal in [place], [place]. She currently taken hydroxychloroquine 200 mg PO daily and prednisone 5 mg PO daily and has not experienced a severe flare or increase in steroid dosing since about a year ago.  She also experiences sicca symptoms currently treated with Restasis.  She was previously taking Imuran but is not clear if she is still taking this and how much is contributory to disease control. 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  She has also previously undergone some fibromyalgia tender point injection with significant benefit of back pain symptoms. 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 She has upcoming nephrology appointment next week for her CKD and due to frequent hard sticks prefers if lab draws may be consolidated with frequent bruising and  discomfort associated.   Review of Systems  Constitutional:  Positive for fatigue.  HENT:  Positive for mouth dryness.   Eyes:  Positive for dryness.  Respiratory:  Negative for shortness of breath.   Cardiovascular:  Negative for swelling in legs/feet.  Gastrointestinal:  Negative for constipation.  Endocrine: Negative for excessive thirst.  Genitourinary:  Negative for difficulty urinating.  Musculoskeletal:  Positive for joint pain, gait problem, joint pain, joint swelling and morning stiffness.  Skin:  Negative for rash.  Allergic/Immunologic: Negative for susceptible to infections.  Neurological:  Positive for dizziness, light-headedness and weakness.  Hematological:  Negative for bruising/bleeding tendency.  Psychiatric/Behavioral:  Positive for sleep disturbance.    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/10/2021   Hypotension 10/22/2020   Atypical chest pain 10/22/2020   CKD (chronic kidney disease), stage III ([place]) 10/22/2020   Nausea & vomiting 10/22/2020   Physical deconditioning 10/22/2020   Atrial flutter ([place]) 10/18/2020   Arthritis 07/15/2020   GERD (gastroesophageal reflux disease) 07/15/2020   Colitis 07/15/2020   Non-ischemic cardiomyopathy ([place]) 07/15/2020   HFrEF (heart failure with reduced ejection fraction) ([place]) 07/15/2020   HTN (hypertension) 07/15/2020   Osteoporosis 07/15/2020   Palpitations 07/15/2020   Acute gout of left shoulder 07/10/2020   Rheumatoid arthritis involving multiple sites with positive rheumatoid factor ([place]) 10/28/2015   Mitral regurgitation 05/30/2014   Acute on chronic combined systolic (congestive) and diastolic (congestive) heart failure ([place]) 05/19/2014   Vitamin  D deficiency 05/15/2013   Encounter for long-term (current) use of other medications 12/14/2008   Sjogren's syndrome ([place]) 12/14/2008    Past Medical History:  Diagnosis Date   Acid reflux 07/15/2020    Arthritis 07/15/2020   CHF (congestive heart failure) ([place])    Colitis 07/15/2020   Dilated cardiomyopathy ([place]) 07/15/2020   HFrEF (heart failure with reduced ejection fraction) ([place]) 07/15/2020   HTN (hypertension) 07/15/2020   Osteoporosis 07/15/2020   Palpitations 07/15/2020   Rheumatoid arthritis ([place]) 07/15/2020    Family History  Problem Relation Age of Onset   Hypertension Mother    Diabetes Mother    Hypertension Father    Diabetes Father    Breast cancer Maternal Aunt    Breast cancer Paternal Aunt    Arthritis Maternal Grandmother    Lung disease Paternal Grandfather    Cancer Brother    Past Surgical History:  Procedure Laterality Date   CARDIOVERSION N/A 11/08/2020   Procedure: CARDIOVERSION;  Surgeon: [name], MD;  Location: [facility];  Service: Cardiovascular;  Laterality: N/A;   RIGHT HEART CATH N/A 08/14/2020   Procedure: RIGHT HEART CATH;  Surgeon: [name], MD;  Location: [facility];  Service: Cardiovascular;  Laterality: N/A;   TEE WITHOUT CARDIOVERSION N/A 11/08/2020   Procedure: TRANSESOPHAGEAL ECHOCARDIOGRAM (TEE);  Surgeon: [name], MD;  Location: [facility] ENDOSCOPY;  Service: Cardiovascular;  Laterality: N/A;   Social History   Social History Narrative   Not on file    There is no immunization history on file for this patient.   Objective: Vital Signs: BP (!) 92/56 (BP Location: Left Arm, Patient Position: Sitting, Cuff Size: Normal)    Pulse 80    Resp 17    Ht 5' 4.5" (1.638 m)    Wt 105 lb (47.6 kg)    BMI 17.74 kg/m    Physical Exam Eyes:     Comments: Bilateral conjunctival injection, left eye erythema worse, no periorbital swelling  Cardiovascular:     Rate and Rhythm: Normal rate and regular rhythm.  Pulmonary:     Effort: Pulmonary effort is normal.     Breath sounds: Normal breath sounds.  Skin:    General: Skin is warm and dry.  Neurological:     Mental Status: She is alert.     Musculoskeletal Exam:  Wrists  tenderness no swelling or decreased ROM Fingers chronic ulnar deviation slight subluxation worse on right side, no palpable synovitis Knees full ROM no tenderness or swelling   Investigation: No additional findings.  Imaging: No results found.  Recent Labs: Lab Results  Component Value Date   WBC 10.0 09/03/2021   HGB 11.4 (L) 09/03/2021   PLT 282 09/03/2021   NA 135 09/03/2021   K 4.0 09/03/2021   CL 99 09/03/2021   CO2 24 09/03/2021   GLUCOSE 129 (H) 09/03/2021   BUN 54 (H) 09/03/2021   CREATININE 2.61 (H) 09/03/2021   BILITOT 0.6 09/03/2021   ALKPHOS 88 09/03/2021   AST 35 09/03/2021   ALT 35 09/03/2021   PROT 6.2 (L) 09/03/2021   ALBUMIN 3.0 (L) 09/03/2021   CALCIUM 8.7 (L) 09/03/2021    Speciality Comments: No specialty comments available.  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 Patrice Paradise with [facility]. Joint disease without obvious corresponding flare or exacerbation. For now plan to increase azathioprine to 75 mg daily slower titration due to low body mass and moderate renal impairment. Continuing hydroxychloroquine 200 mg daily for now. She is hesitant to take significant systemic steroid doses with concern about her Afib and heart failure. Will increased dose temporarily to 20 mg daily taper back down to chronic dose after 2 weeks. Needs follow up in 1 month for monitoring with DMARD titration.  Arthritis  Mild bilateral hand and wrist pain without active synovitis currently. Treatment adjustment today more for eye involvement than joint disease activity.  Orders: No orders  of the defined types were placed in this encounter.  Meds ordered this encounter  Medications   Azathioprine 75 MG TABS    Sig: Take 1 tablet (75 mg total) by mouth daily.    Dispense:  30 tablet    Refill:  0   predniSONE (DELTASONE) 10 MG tablet    Sig: Take 2 tablets (20 mg total) by mouth daily with breakfast for 7 days, THEN 1 tablet (10 mg total) daily with breakfast for 7 days.    Dispense:  21 tablet    Refill:  0     Follow-Up Instructions: Return in about 4 weeks (around 09/17/2021) for RA AZA increase f/u 33mo   CCollier Salina MD  Note - This record has been created using [employer]  Chart creation errors have been sought, but may not always  have been located. Such creation errors do not reflect on  the standard of medical care.

## 2021-08-20 ENCOUNTER — Ambulatory Visit: Payer: Medicare PPO | Admitting: Internal Medicine

## 2021-08-20 ENCOUNTER — Encounter: Payer: Self-pay | Admitting: Internal Medicine

## 2021-08-20 ENCOUNTER — Other Ambulatory Visit: Payer: Self-pay

## 2021-08-20 VITALS — BP 92/56 | HR 80 | Resp 17 | Ht 64.5 in | Wt 105.0 lb

## 2021-08-20 DIAGNOSIS — M1990 Unspecified osteoarthritis, unspecified site: Secondary | ICD-10-CM

## 2021-08-20 DIAGNOSIS — M0579 Rheumatoid arthritis with rheumatoid factor of multiple sites without organ or systems involvement: Secondary | ICD-10-CM | POA: Diagnosis not present

## 2021-08-20 DIAGNOSIS — M3500 Sicca syndrome, unspecified: Secondary | ICD-10-CM | POA: Diagnosis not present

## 2021-08-20 MED ORDER — AZATHIOPRINE 75 MG PO TABS: 75.0000 mg | ORAL_TABLET | Freq: Every day | ORAL | 0 refills | Status: DC

## 2021-08-20 MED ORDER — PREDNISONE 10 MG PO TABS: ORAL_TABLET | ORAL | 0 refills | Status: AC

## 2021-08-20 NOTE — Patient Instructions (Signed)
I recommend increasing azathioprine from 50 mg daily to 75 mg daily. I sent a new prescription for the higher dose tablets, you can take 1.5 of the old tablets if wanting to use these first is also okay.  Increase prednisone to 20 mg daily for 1 week then 10 mg daily for 1 week before resuming the 5 mg daily dose as usual. I sent a prescription for 10 mg tablets.  We should follow up in 1 month after medication change to recheck symptoms and lab results.

## 2021-08-22 ENCOUNTER — Telehealth (HOSPITAL_COMMUNITY): Payer: Self-pay

## 2021-08-22 NOTE — Telephone Encounter (Signed)
I suspect this may be due to hemorrhoids.

## 2021-08-22 NOTE — Telephone Encounter (Signed)
Patient called to report that she has been having rectal bleeding when she has a bowel movement. She reports that after she finishes wiping about 3 times it goes away. She reports she doesn't strain nor is constipated but wonders if its due to the Eliquis. She reports that this has been off and on for the past 1-2 weeks. I advised patient to follow up with pcp but she did want to know your thoughts. Please advise.

## 2021-08-25 NOTE — Telephone Encounter (Signed)
Called pt to inform no answer/left vm

## 2021-09-03 ENCOUNTER — Other Ambulatory Visit: Payer: Self-pay

## 2021-09-03 ENCOUNTER — Encounter: Payer: Self-pay | Admitting: *Deleted

## 2021-09-03 ENCOUNTER — Telehealth: Payer: Self-pay

## 2021-09-03 ENCOUNTER — Encounter (HOSPITAL_COMMUNITY): Payer: Self-pay | Admitting: Cardiology

## 2021-09-03 ENCOUNTER — Other Ambulatory Visit (HOSPITAL_COMMUNITY): Payer: Self-pay

## 2021-09-03 ENCOUNTER — Ambulatory Visit (HOSPITAL_COMMUNITY)
Admission: RE | Admit: 2021-09-03 | Discharge: 2021-09-03 | Disposition: A | Discharge status: Home / Self Care | Payer: Medicare PPO | Source: Ambulatory Visit | Attending: Cardiology | Admitting: Cardiology

## 2021-09-03 ENCOUNTER — Other Ambulatory Visit (HOSPITAL_COMMUNITY): Payer: Self-pay | Admitting: *Deleted

## 2021-09-03 VITALS — BP 94/50 | HR 87 | Wt 104.6 lb

## 2021-09-03 DIAGNOSIS — I5020 Unspecified systolic (congestive) heart failure: Secondary | ICD-10-CM

## 2021-09-03 DIAGNOSIS — I491 Atrial premature depolarization: Secondary | ICD-10-CM | POA: Diagnosis not present

## 2021-09-03 DIAGNOSIS — Z006 Encounter for examination for normal comparison and control in clinical research program: Secondary | ICD-10-CM

## 2021-09-03 LAB — CBC
HCT: 34.8 % — ABNORMAL LOW (ref 36.0–46.0)
Hemoglobin: 11.4 g/dL — ABNORMAL LOW (ref 12.0–15.0)
MCH: 29.7 pg (ref 26.0–34.0)
MCHC: 32.8 g/dL (ref 30.0–36.0)
MCV: 90.6 fL (ref 80.0–100.0)
Platelets: 282 10*3/uL (ref 150–400)
RBC: 3.84 MIL/uL — ABNORMAL LOW (ref 3.87–5.11)
RDW: 17.6 % — ABNORMAL HIGH (ref 11.5–15.5)
WBC: 10 10*3/uL (ref 4.0–10.5)
nRBC: 0 % (ref 0.0–0.2)

## 2021-09-03 LAB — COMPREHENSIVE METABOLIC PANEL
ALT: 35 U/L (ref 0–44)
AST: 35 U/L (ref 15–41)
Albumin: 3 g/dL — ABNORMAL LOW (ref 3.5–5.0)
Alkaline Phosphatase: 88 U/L (ref 38–126)
Anion gap: 12 (ref 5–15)
BUN: 54 mg/dL — ABNORMAL HIGH (ref 8–23)
CO2: 24 mmol/L (ref 22–32)
Calcium: 8.7 mg/dL — ABNORMAL LOW (ref 8.9–10.3)
Chloride: 99 mmol/L (ref 98–111)
Creatinine, Ser: 2.61 mg/dL — ABNORMAL HIGH (ref 0.44–1.00)
GFR, Estimated: 18 mL/min — ABNORMAL LOW (ref >60)
Glucose, Bld: 129 mg/dL — ABNORMAL HIGH (ref 70–99)
Potassium: 4 mmol/L (ref 3.5–5.1)
Sodium: 135 mmol/L (ref 135–145)
Total Bilirubin: 0.6 mg/dL (ref 0.3–1.2)
Total Protein: 6.2 g/dL — ABNORMAL LOW (ref 6.5–8.1)

## 2021-09-03 LAB — IRON AND TIBC
Iron: 46 ug/dL (ref 28–170)
Saturation Ratios: 12 % (ref 10.4–31.8)
TIBC: 375 ug/dL (ref 250–450)
UIBC: 329 ug/dL

## 2021-09-03 LAB — FERRITIN: Ferritin: 65 ng/mL (ref 11–307)

## 2021-09-03 LAB — TSH: TSH: 1.709 u[IU]/mL (ref 0.350–4.500)

## 2021-09-03 LAB — BRAIN NATRIURETIC PEPTIDE: B Natriuretic Peptide: 395.7 pg/mL — ABNORMAL HIGH (ref 0.0–100.0)

## 2021-09-03 MED ORDER — VERQUVO 10 MG PO TABS: 10.0000 mg | ORAL_TABLET | Freq: Every day | ORAL | 3 refills | Status: DC

## 2021-09-03 NOTE — Patient Instructions (Signed)
Medication Changes:  No Change  Lab Work:  Labs done today, your results will be available in MyChart, we will contact you for abnormal readings.   Testing/Procedures:  You have been ordered a PYP Scan.  This is done in the Radiology Department of [facility]  When you come for this test please plan to be there 2-3 hours. ONCE APPROVED BY INSURANCE YOU WILL BE CALLED TO SCHEDULE YOUR APPOINTMENT.  Referrals:  NONE  Special Instructions // Education:  none  Follow-Up in: 6 weeks  At the [facility], you and your health needs are our priority. We have a designated team specialized in the treatment of Heart Failure. This Care Team includes your primary Heart Failure Specialized Cardiologist (physician), Advanced Practice Providers (APPs- Physician Assistants and Nurse Practitioners), and Pharmacist who all work together to provide you with the care you need, when you need it.   You may see any of the following providers on your designated Care Team at your next follow up:  Dr Glori Bickers Dr Haynes Kerns, NP Lyda Jester, [place] [facility] [place], [place] Audry Riles, PharmD   Please be sure to bring in all your medications bottles to every appointment.   Need to Contact us:  If you have any questions or concerns before your next appointment please send [place] a message through [place] or call our office at [phone].    TO LEAVE A MESSAGE FOR THE NURSE SELECT OPTION 2, PLEASE LEAVE A MESSAGE INCLUDING: YOUR NAME DATE OF BIRTH CALL BACK NUMBER REASON FOR CALL**this is important as we prioritize the call backs  YOU WILL RECEIVE A CALL BACK THE SAME DAY AS LONG AS YOU CALL BEFORE 4:00 PM

## 2021-09-03 NOTE — Chronic Care Management (AMB) (Signed)
Chronic Care Management Pharmacy Assistant   Name: Tracy Nelson  MRN: [id number] DOB: Dec 22, 1945  Reason for Encounter: Disease State/ Hypertension   Recent office visits:  None  Recent consult visits:  09-03-2021 Tracy Dresser, MD (Cardiology). PYP scan ordered. Follow up in 6 weeks.  08-20-2021 Tracy Salina, MD (Rheumatology). INCREASE Azathioprine 50 mg daily TO 75 mg daily. START Prednisone 10 mg Take 2 tablets (20 mg total) by mouth daily with breakfast for 7 days, THEN 1 tablet (10 mg total) daily with breakfast for 7 days. Follow up in 1 month.  07-18-2021 Tracy Dresser, MD (Cardiology). B Nat Peptide= 548.9. Glucose= 103, BUN= 31, Creatinine= 1.78, Calcium= 8.7, Total protein= 6.4, Albumin= 2.9, Total Bilirubin= 0.2, GFR, Estimated= 29. EKG completed.  Hospital visits:  None in previous 6 months  Medications: Outpatient Encounter Medications as of 09/03/2021  Medication Sig   acetaminophen (TYLENOL) 500 MG tablet Take 500-1,000 mg by mouth every 6 (six) hours as needed for moderate pain.   amiodarone (PACERONE) 200 MG tablet Take 1 tablet (200 mg total) by mouth daily.   Azathioprine 75 MG TABS Take 1 tablet (75 mg total) by mouth daily.   bumetanide (BUMEX) 2 MG tablet Take 2 mg by mouth 2 (two) times daily.   Cholecalciferol (VITAMIN D-3 PO) Take 25 mcg by mouth once a week.   cholestyramine (QUESTRAN) 4 g packet Take 1 packet (4 g total) by mouth daily.   cycloSPORINE (RESTASIS) 0.05 % ophthalmic emulsion Place 1 drop into both eyes 2 (two) times daily.   ELIQUIS 2.5 MG TABS tablet TAKE 1 TABLET BY MOUTH TWICE A DAY   Ensure (ENSURE) Take 237 mLs by mouth 2 (two) times daily between meals.   FARXIGA 10 MG TABS tablet TAKE 1 TABLET BY MOUTH DAILY BEFORE BREAKFAST.   folic acid (FOLVITE) 1 MG tablet TAKE 1 TABLET BY MOUTH EVERY DAY   hydroxychloroquine (PLAQUENIL) 200 MG tablet TAKE 1 TABLET BY MOUTH EVERY DAY   Menthol, Topical Analgesic, (ICY HOT BACK  EX) Apply 1 application topically daily as needed (pain).   MITIGARE 0.6 MG CAPS Take 0.6 mg by mouth daily as needed (gout).   moxifloxacin (VIGAMOX) 0.5 % ophthalmic solution Place 1 drop into the left eye 4 (four) times daily.   Multiple Vitamin (MULTIVITAMIN) tablet Take 1 tablet by mouth daily.   omeprazole (PRILOSEC) 40 MG capsule Take 40 mg by mouth daily.   polyethylene glycol (MIRALAX / GLYCOLAX) 17 g packet Take 17 g by mouth as needed.   predniSONE (DELTASONE) 10 MG tablet Take 2 tablets (20 mg total) by mouth daily with breakfast for 7 days, THEN 1 tablet (10 mg total) daily with breakfast for 7 days.   predniSONE (DELTASONE) 5 MG tablet TAKE 1 TABLET BY MOUTH EVERY DAY WITH BREAKFAST   Vericiguat (VERQUVO) 10 MG TABS Take 10 mg by mouth daily.   Vitamin D, Ergocalciferol, (DRISDOL) 1.25 MG (50000 UNIT) CAPS capsule Take 50,000 Units by mouth once a week.   No facility-administered encounter medications on file as of 09/03/2021.  Reviewed chart prior to disease state call. Spoke with patient regarding BP  Recent Office Vitals: BP Readings from Last 3 Encounters:  09/03/21 (!) 94/50  08/20/21 (!) 92/56  07/18/21 (!) 88/60   Pulse Readings from Last 3 Encounters:  09/03/21 87  08/20/21 80  07/18/21 82    Wt Readings from Last 3 Encounters:  09/03/21 104 lb 9.6 oz (47.4 kg)  08/20/21 105 lb (47.6 kg)  07/18/21 108 lb (49 kg)     Kidney Function Lab Results  Component Value Date/Time   CREATININE 1.78 (H) 07/18/2021 12:55 PM   CREATININE 1.96 (H) 06/04/2021 12:40 PM   GFRNONAA 29 (L) 07/18/2021 12:55 PM    BMP Latest Ref Rng & Units 07/18/2021 06/04/2021 02/26/2021  Glucose 70 - 99 mg/dL 103(H) 98 83  BUN 8 - 23 mg/dL 31(H) 48(H) 54(H)  Creatinine 0.44 - 1.00 mg/dL 1.78(H) 1.96(H) 2.08(H)  BUN/Creat Ratio 12 - 28 - 24 -  Sodium 135 - 145 mmol/L 135 143 137  Potassium 3.5 - 5.1 mmol/L 4.3 4.1 3.6  Chloride 98 - 111 mmol/L 102 103 99  CO2 22 - 32 mmol/L 25 23 27    Calcium 8.9 - 10.3 mg/dL 8.7(L) 9.4 8.7(L)    Current antihypertensive regimen:  Vericiguat 10 mg tablet -once per day Amiodarone 200 mg tablet by mouth aily Bumex 1 mg - 2 mg in the morning and 1 mg in the evening   How often are you checking your Blood Pressure? daily  Current home BP readings: 92/50  What recent interventions/DTPs have been made by any provider to improve Blood Pressure control since last CPP Visit:  Educated on Importance of home blood pressure monitoring; Proper BP monitoring technique; Symptoms of hypotension and importance of maintaining adequate hydration; -Counseled to monitor BP at home at l, document, and provide log at future appointments -Recommended to continue current medication  Any recent hospitalizations or ED visits since last visit with CPP? No  What diet changes have been made to improve Blood Pressure Control?  Patient states she has limited her sat intake and tries to drink water daily.  What exercise is being done to improve your Blood Pressure Control?  Patient states she walks around some.  Adherence Review: Is the patient currently on ACE/ARB medication? No Does the patient have >5 day gap between last estimated fill dates? No  Care Gaps: Covid vaccine overdue AWV 04-30-2022  Star Rating Drugs: Farxiga 10 mg- Last filled 09-03-2021 [facility]  [place] Clinical Pharmacist Assistant [phone]

## 2021-09-03 NOTE — Telephone Encounter (Signed)
Advanced Heart Failure Patient Advocate Encounter  Called and spoke with patient. She was approved for a PAN HF grant that will cover the cost of Verquvo. She is aware that we will not seek assistance from [facility] at this time. Will circle back to assistance if the grant runs out before the end of the year. 90 day copay is $128. Sent 90 day RX request to [employer], corporate) [facility].    Member ID: [id number] Group ID: [id number] RxBin ID: [id number] PCN: [id number] Eligibility Start Date: 06/05/2021 Eligibility End Date: 09/02/2022 Assistance Amount: $1,200.00

## 2021-09-03 NOTE — Research (Signed)
Spoke to patient about Batwire trial and gave her information.  Answered her questions about the study. Patient will call if interested.

## 2021-09-04 ENCOUNTER — Ambulatory Visit: Payer: Medicare PPO | Admitting: Nurse Practitioner

## 2021-09-04 ENCOUNTER — Other Ambulatory Visit (HOSPITAL_COMMUNITY): Payer: Self-pay | Admitting: Cardiology

## 2021-09-04 ENCOUNTER — Encounter: Payer: Self-pay | Admitting: Nurse Practitioner

## 2021-09-04 VITALS — BP 98/58 | HR 85 | Temp 98.7°F | Ht 64.5 in | Wt 104.0 lb

## 2021-09-04 DIAGNOSIS — I130 Hypertensive heart and chronic kidney disease with heart failure and stage 1 through stage 4 chronic kidney disease, or unspecified chronic kidney disease: Secondary | ICD-10-CM

## 2021-09-04 DIAGNOSIS — R7303 Prediabetes: Secondary | ICD-10-CM

## 2021-09-04 DIAGNOSIS — I10 Essential (primary) hypertension: Secondary | ICD-10-CM

## 2021-09-04 DIAGNOSIS — I5020 Unspecified systolic (congestive) heart failure: Secondary | ICD-10-CM | POA: Diagnosis not present

## 2021-09-04 DIAGNOSIS — K625 Hemorrhage of anus and rectum: Secondary | ICD-10-CM | POA: Diagnosis not present

## 2021-09-04 DIAGNOSIS — E782 Mixed hyperlipidemia: Secondary | ICD-10-CM

## 2021-09-04 DIAGNOSIS — N184 Chronic kidney disease, stage 4 (severe): Secondary | ICD-10-CM

## 2021-09-04 DIAGNOSIS — D649 Anemia, unspecified: Secondary | ICD-10-CM

## 2021-09-04 MED ORDER — SODIUM CHLORIDE 0.9 % IV SOLN: 510.0000 mg | Freq: Once | INTRAVENOUS | Status: DC

## 2021-09-04 MED ORDER — NYSTATIN 100000 UNIT/ML MT SUSP: 5.0000 mL | Freq: Four times a day (QID) | OROMUCOSAL | 1 refills | Status: AC

## 2021-09-04 NOTE — Progress Notes (Signed)
Cardiology: Dr. Aundra Dubin  76 y.o. with history of rheumatoid arthritis, CKD stage 3, and nonischemic cardiomyopathy was referred by Dr. Andrey Cota in [place] to establish heart failure care in [place].  Patient has been known to have a cardiomyopathy since 2015.  Cardiac MRI in 2015 showed EF 37% with no LGE.  Coronary angiography at that time showed no significant coronary disease.  Over the next few years, LV EF fluctuated up and down.  In 3/21, echo showed EF down to 20-25%.  She was admitted to the hospital in [place], [place] in 11/21 with CHF exacerbation.  Echo showed EF 20-25%, global hypokinesis, mildly decreased RV systolic function.  Creatinine was noted to be elevated. She was diuresed and sent home, but was readmitted later in 11/21 with ongoing volume overload. Creatinine was up to 2.89.  She was taken off most of her cardiac meds due to soft BP and elevated creatinine. She was discharged to her daughter's house in [place].   I repeated an echo on her in 12/21, EF < 20% with moderate LV dilation and mildly decreased RV systolic function. RHC was done, surprisingly showing low filling pressures and relatively preserved cardiac index at 2.22.   She was admitted in 3/22 with atrial fibrillation, converted to NSR on amiodarone. She went into atrial flutter after this and had TEE-DCCV in 4/22.  TEE showed EF < 20%, moderate LV dilation, mild RV dilation with moderate RV dysfunction, moderate central MR.  CPX in 3/22 showed severe HF limitation.  PYP scan 4/22 was grade 1 with H/CL 1.48, equivocal.    She was evaluated for [facility] or ANTHEM, BNP too high and thought to be too frail.   Cardiac MRI in 10/22 showed mild LV dilation with EF 23%, normal RV size with RVEF 37%, ECV 34%, small area of subendocardial LGE in the mid inferolateral wall and small area of full thickness LGE in the basal inferior wall.   Patient returns today for followup of CHF. She is in NSR today.  Renal increased her  bumetanide to 2 mg bid earlier this week. She tires easily but denies dyspnea walking on flat ground (though she does not go far).  She has chronic 2 pillow orthopnea. She has had episodes of shortness of breath at night that are concerning for PND.  Rare atypical chest pain.  Occasional palpitations.  She has had episodes of BRBPR, notes on toilet paper when she wipes.    ECG (personally reviewed): NSR, PACs, LAFB  Labs (12/21): K 5 => 3.7, creatinine 2.89 => 1.9 => 1.69, BNP 2904 Labs (1/22): K 4, creatinine 1.9, hgb 12.5 Labs (3/22): K 3.6, creatinine 2.36, hgb 11.6, TSH normal Labs (4/22): K 3.7, creatinine 2.25, LFTs normal, urine immunofixation normal, pro-BNP 6498 Labs (7/22): Myeloma panel with IgG monoclonal protein Labs (10/22): K 4.1, creatinine 1.96 Labs (12/22): BNP 549, TSH normal, K 4.3, creatinine 1.78  PMH: 1. GERD 2. Rheumatoid arthritis: Followed by rheumatology in [place].  - No known pulmonary disease from RA.  3. Osteoporosis. 4. Gout 5. CKD stage 4 6. H/o SVT 7. Chronic systolic CHF: She was found to have nonischemic cardiomyopathy with low EF in 2015.  - LHC (2015): Normal coronaries.  - cardiac MRI (2015): EF 37%, no LGE - Echo (3/21): EF 20-25%. - Echo (11/21): EF 20-25%, moderate LV dilation, global hypokinesis, biatrial enlargement, mildly decreased RV systolic function.  - Echo (12/21): EF < 20%, moderate LV dilation, normal RV size/systolic function, moderate MR, IVC normal.  -  RHC (1/22): Mean RA 1, PA 39/14, mean PCWP 10, PVR 3.9 WU, CI 2.22.  - CPX (3/22): peak VO2 10.1, VE/VCO2 55, RER 1.13.  Severe functional limitation due to HF.  - TEE (4/22): EF < 20%, moderate LV dilation, mild RV dilation with moderate RV dysfunction, moderate central MR. - PYP scan (4/22): grade 1 with H/CL 1.48. Equivocal. [employer]ae gene testing negative. - Cardiac MRI (10/22): mild LV dilation with EF 23%, normal RV size with RVEF 37%, ECV 34%, small area of subendocardial LGE  in the mid inferolateral wall and small area of full thickness LGE in the basal inferior wall.  8. Atrial fibrillation/flutter: TEE-DCCV in 4/22.  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   SH: From [place] but recently moved to [place] to live with daughter.  Has 3 children.  Nonsmoker.  No ETOH.   FH: Mother with CHF, father with MI.   ROS: All systems reviewed and negative except as per HPI.   Current Outpatient Medications  Medication Sig Dispense Refill   acetaminophen (TYLENOL) 500 MG tablet Take 500-1,000 mg by mouth every 6 (six) hours as needed for moderate pain.     amiodarone (PACERONE) 200 MG tablet Take 1 tablet (200 mg total) by mouth daily. 60 tablet 6   Azathioprine 75 MG TABS Take 1 tablet (75 mg total) by mouth daily. 30 tablet 0   bumetanide (BUMEX) 2 MG tablet Take 2 mg by mouth 2 (two) times daily.     Cholecalciferol (VITAMIN D-3 PO) Take 25 mcg by mouth once a week.     cholestyramine (QUESTRAN) 4 g packet Take 1 packet (4 g total) by mouth daily. 60 each 12   cycloSPORINE (RESTASIS) 0.05 % ophthalmic emulsion Place 1 drop into both eyes 2 (two) times daily. 1.5 mL 2   ELIQUIS 2.5 MG TABS tablet TAKE 1 TABLET BY MOUTH TWICE A DAY 60 tablet 6   Ensure (ENSURE) Take 237 mLs by mouth 2 (two) times daily between meals.     FARXIGA 10 MG TABS tablet TAKE 1 TABLET BY MOUTH DAILY BEFORE BREAKFAST. 30 tablet 5   folic acid (FOLVITE) 1 MG tablet TAKE 1 TABLET BY MOUTH EVERY DAY 30 tablet 11   hydroxychloroquine (PLAQUENIL) 200 MG tablet TAKE 1 TABLET BY MOUTH EVERY DAY 30 tablet 11   Menthol, Topical Analgesic, (ICY HOT BACK EX) Apply 1 application topically daily as needed (pain).     MITIGARE 0.6 MG CAPS Take 0.6 mg by mouth daily as needed (gout).     moxifloxacin (VIGAMOX) 0.5 % ophthalmic solution Place 1 drop into the left eye 4 (four) times daily.     Multiple Vitamin (MULTIVITAMIN) tablet Take 1 tablet by mouth daily.     omeprazole  (PRILOSEC) 40 MG capsule Take 40 mg by mouth daily.     polyethylene glycol (MIRALAX / GLYCOLAX) 17 g packet Take 17 g by mouth as needed.     predniSONE (DELTASONE) 5 MG tablet TAKE 1 TABLET BY MOUTH EVERY DAY WITH BREAKFAST 30 tablet 3   Vitamin D, Ergocalciferol, (DRISDOL) 1.25 MG (50000 UNIT) CAPS capsule Take 50,000 Units by mouth once a week.     nystatin (MYCOSTATIN) 100000 UNIT/ML suspension Take 5 mLs (500,000 Units total) by mouth 4 (four) times daily for 14 days. Please swish and swallow 280 mL 1   Vericiguat (VERQUVO) 10 MG TABS Take 10 mg by mouth daily. 90 tablet 3   No current facility-administered medications  for this encounter.   BP (!) 94/50    Pulse 87    Wt 47.4 kg (104 lb 9.6 oz)    SpO2 99%    BMI 17.68 kg/m  General: NAD, frail.  Neck: No JVD, no thyromegaly or thyroid nodule.  Lungs: Clear to auscultation bilaterally with normal respiratory effort. CV: Nondisplaced PMI.  Heart regular S1/S2, no S3/S4, no murmur.  No peripheral edema.  No carotid bruit.  Normal pedal pulses.  Abdomen: Soft, nontender, no hepatosplenomegaly, no distention.  Skin: Intact without lesions or rashes.  Neurologic: Alert and oriented x 3.  Psych: Normal affect. Extremities: No clubbing or cyanosis.  HEENT: Normal.   Assessment/Plan: 1. Chronic systolic CHF: Nonischemic cardiomyopathy.  This has been known since 2015, cath in 2015 showed no coronary disease and cardiac MRI in 2015 showed no LGE.  Cause is uncertain, familial cardiomyopathy is a concern given nonischemic cardiomyopathy in her mother.  Cannot rule out remote viral myocarditis. She remains quite limited, primarily by fatigue, NYHA class IIIb.  Poor appetite and severe HF limitation on CPX in 3/22 is concerning, as is elevated creatinine (suspect cardiorenal syndrome).  Low BP and elevated creatinine have limited her cardiac med regimen. RHC (1/22) showed low filling pressures and actually a relatively preserved cardiac index of  2.22.  PYP scan in 4/22 was equivocal.  Cardiac MRI in 10/22 showed mild LV dilation with EF 23%, normal RV size with RVEF 37%, ECV 34%, small area of subendocardial LGE in the mid inferolateral wall and small area of full thickness LGE in the basal inferior wall.   This was not suggestive of cardiac amyloidosis but possibly could be consistent with coronary embolism (though no LV thrombus visualized) versus prior myocarditis versus sarcoidosis. She is not volume overloaded on exam today, chronic NYHA class III symptoms.  No BP room for titration of GDMT.  - Continue bumetanide 2 mg bid but may need to decrease if creatinine higher.  Draw BMET today.   - Continue Verquvo 10 mg daily.  - Continue dapagliflozin 10 mg daily.  - Off spironolactone with recent rise in creatinine and soft BP.  - Though RHC was not markedly abnormal, CPX showed severe functional limitation due to HF.  I am worried that she is near end-stage.  She is frail with CKD stage 4, I do not think that she would be a candidate for LVAD.  - With renal failure, CHF, and atrial arrhythmias, we have assessed for cardiac amyloidosis.  PYP scan was equivocal in 4/22 for transthyretin cardiac amyloidosis, I will arrange for repeat PYP scan.  Inv[employer]ae gene testing for TTR mutations was negative. The cardiac MRI actually was not consistent with amyloidosis.  She has MGUS with monoclonal IgG paraprotein, but based on cMRI and slow progression, think AL amyloidosis is unlikely.   - I would like her reconsidered for barostimulation activation therapy or vagal nerve stimulation (Batwire and ANTHEM trial), will have research nurse see her today.   - Does not have an ICD, would not place given NICM, advanced age, and concern for nearing end stage HF.  2. CKD: Stage 4.   - BMET today.  3. Rheumatoid arthritis: No history of lung involvement. She has been on a low dose of prednisone chronically.  4. Atrial fibrillation/flutter: S/p TEE-DCCV in 4/22.  She  is now on amiodarone and in NSR.  - Continue amiodarone 200 mg daily. Check LFTs and TSH.  Will need regular eye exam.  - Continue  Eliquis 5 mg bid.  5. Hematochezia: Blood noted on toilet tissue.   - Check Fe studies and CBC.  - Would discuss referral to GI with PCP.   Followup in 6 wks with APP   Loralie Champagne 09/04/2021

## 2021-09-04 NOTE — Progress Notes (Signed)
Orders for fereheme placed as ordered per Dr Aundra Dubin  - Also would give feraheme infusion with transferrin saturation 12%

## 2021-09-04 NOTE — Patient Instructions (Addendum)
Preventing Type 2 Diabetes Mellitus °Type 2 diabetes, also called type 2 diabetes mellitus, is a long-term (chronic) disease that affects sugar (glucose) levels in your blood. Normally, a hormone called insulin allows glucose to enter cells in your body. The cells use glucose for energy. With type 2 diabetes, you will have one or both of these problems: °Your pancreas does not make enough insulin. °Cells in your body do not respond properly to insulin that your body makes (insulin resistance). °Insulin resistance or lack of insulin causes extra glucose to build up in the blood instead of going into cells. As a result, high blood glucose (hyperglycemia) develops. That can cause many complications. Being overweight or obese and having an inactive (sedentary) lifestyle can increase your risk for diabetes. Type 2 diabetes can be delayed or prevented by making certain nutrition and lifestyle changes. °How can this condition affect me? °If you do not take steps to prevent diabetes, your blood glucose levels may keep increasing over time. Too much glucose in your blood for a long time can damage your blood vessels, heart, kidneys, nerves, and eyes. °Type 2 diabetes can lead to chronic health problems and complications, such as: °Heart disease. °Stroke. °Blindness. °Kidney disease. °Depression. °Poor circulation in your feet and legs. In severe cases, a foot or leg may need to be surgically removed (amputated). °What can increase my risk? °You may be more likely to develop type 2 diabetes if you: °Have type 2 diabetes in your family. °Are overweight or obese. °Have a sedentary lifestyle. °Have insulin resistance or a history of prediabetes. °Have a history of pregnancy-related (gestational) diabetes or polycystic ovary syndrome (PCOS). °What actions can I take to prevent this? °It can be difficult to recognize signs of type 2 diabetes. Taking action to prevent the disease before you develop symptoms is the best way to avoid  possible damage to your body. Making certain nutrition and lifestyle changes may prevent or delay the disease and related health problems. °Nutrition ° °Eat healthy meals and snacks regularly. Do not skip meals. Fruit or a handful of nuts is a healthy snack between meals. °Drink water throughout the day. Avoid drinks that contain added sugar, such as soda or sweetened tea. Drink enough fluid to keep your urine pale yellow. °Follow instructions from your health care provider about eating or drinking restrictions. °Limit the amount of food you eat by: °Managing how much you eat at a time (portion size). °Checking food labels for the serving sizes of food. °Using a kitchen scale to weigh amounts of food. °Sauté or steam food instead of frying it. Cook with water or broth instead of oils or butter. °Limit saturated fat and salt (sodium) in your diet. Have no more than 1 tsp (2,400 mg) of sodium a day. If you have heart disease or high blood pressure, use less than ½?¾ tsp (1,500 mg) of sodium a day. °Lifestyle ° °Lose weight if needed and as told. Your health care provider can determine how much weight loss is best for you and can help you lose weight safely. °If you are overweight or obese, you may be told to lose at least 5?7% of your body weight. °Manage blood pressure, cholesterol, and stress. Your health care provider will help determine the best treatment for you. °Do not use any products that contain nicotine or tobacco. These products include cigarettes, chewing tobacco, and vaping devices, such as e-cigarettes. If you need help quitting, ask your health care provider. °Activity ° °Do physical   activity that makes your heart beat faster and makes you sweat (moderate intensity). Do this for at least 30 minutes on at least 5 days of the week, or as much as told by your health care provider. °Ask your health care provider what activities are safe for you. A mix of activities may be best, such as walking, swimming,  cycling, and strength training. °Try to add physical activity into your day. For example: °Park your car farther away than usual so that you walk more. °Take a walk during your lunch break. °Use stairs instead of elevators or escalators. °Walk or bike to work instead of driving. °Alcohol use °If you drink alcohol: °Limit how much you have to: °0?1 drink a day for women who are not pregnant. °0?2 drinks a day for men. °Know how much alcohol is in your drink. In the [place] one drink equals one 12 oz bottle of beer (355 mL), one 5 oz glass of wine (148 mL), or one 1½ oz glass of hard liquor (44 mL). °General information °Talk with your health care provider about your risk factors and how you can reduce your risk for diabetes. °Have your blood glucose tested regularly, as told by your health care provider. °Get screening tests as told by your health care provider. You may have these regularly, especially if you have certain risk factors for type 2 diabetes. °Make an appointment with a registered dietitian. This diet and nutrition specialist can help you make a healthy eating plan and help you understand portion sizes and food labels. °Where to find support °Ask your health care provider to recommend a registered dietitian, a certified diabetes care and education specialist, or a weight loss program. °Look for local or online weight loss groups. °Join a gym, fitness club, or outdoor activity group, such as a walking club. °Where to find more information °For help and guidance and to learn more about diabetes and diabetes prevention, visit: °American Diabetes Association (ADA): www.diabetes.org °National Institute of Diabetes and Digestive and Kidney Diseases: www.niddk.nih.gov °To learn more about healthy eating, visit: °[place] Department of Agriculture (USDA): www.choosemyplate.gov °Office of Disease Prevention and Health Promotion (ODPHP): health.gov °Summary °You can delay or prevent type 2 diabetes by eating healthy  foods, losing weight if needed, and increasing your physical activity. °Talk with your health care provider about your risk factors for type 2 diabetes and how you can reduce your risk. °It can be difficult to recognize the signs of type 2 diabetes. The best way to avoid possible damage to your body is to take action to prevent the disease before you develop symptoms. °Get screening tests as told by your health care provider. °This information is not intended to replace advice given to you by your health care provider. Make sure you discuss any questions you have with your health care provider. °Document Revised: 10/21/2020 Document Reviewed: 10/21/2020 °Elsevier Patient Education © 2022 [employer] ° °

## 2021-09-04 NOTE — Progress Notes (Addendum)
I,Tracy Nelson,acting as a Education administrator for [employer], [name].,have documented all relevant documentation on the behalf of Tracy Brine, [name],as directed by  Tracy Brine, [name] while in the presence of Tracy Nelson, Tracy Nelson.  This visit occurred during the SARS-CoV-2 public health emergency.  Safety protocols were in place, including screening questions prior to the visit, additional usage of staff PPE, and extensive cleaning of exam room while observing appropriate contact time as indicated for disinfecting solutions.  Subjective:     [name] , female    DOB: [birth year] , 76 y.o.   MRN: [id number]   Chief Complaint  Patient presents with   Diabetes    HPI  Patient is here for diabetes follow up. She seen Dr. Marigene Ehlers yesterday.   BP Readings from Last 3 Encounters: 09/04/21 : (!) 98/58 09/03/21 : (!) 94/50 08/20/21 : (!) 92/56    Diabetes She presents for her follow-up diabetic visit. Diabetes type: prediabetes. There are no hypoglycemic associated symptoms. There are no diabetic associated symptoms. There are no hypoglycemic complications. There are no diabetic complications.    Past Medical History:  Diagnosis Date   Acid reflux 07/15/2020   Arthritis 07/15/2020   CHF (congestive heart failure) ([place])    Colitis 07/15/2020   Dilated cardiomyopathy ([place]) 07/15/2020   HFrEF (heart failure with reduced ejection fraction) ([place]) 07/15/2020   HTN (hypertension) 07/15/2020   Osteoporosis 07/15/2020   Palpitations 07/15/2020   Rheumatoid arthritis ([place]) 07/15/2020     Family History  Problem Relation Age of Onset   Hypertension Mother    Diabetes Mother    Hypertension Father    Diabetes Father    Breast cancer Maternal Aunt    Breast cancer Paternal Aunt    Arthritis Maternal Grandmother    Lung disease Paternal Grandfather    Cancer Brother      Current Outpatient Medications:    acetaminophen (TYLENOL) 500 MG tablet, Take 500-1,000 mg by mouth every 6 (six) hours  as needed for moderate pain., Disp: , Rfl:    amiodarone (PACERONE) 200 MG tablet, Take 1 tablet (200 mg total) by mouth daily., Disp: 60 tablet, Rfl: 6   azaTHIOprine (IMURAN) 50 MG tablet, Take 2 tablets (100 mg total) by mouth daily., Disp: 60 tablet, Rfl: 1   bumetanide (BUMEX) 2 MG tablet, Take 2 mg by mouth 2 (two) times daily., Disp: , Rfl:    Cholecalciferol (VITAMIN D-3 PO), Take 25 mcg by mouth 3 (three) times a week., Disp: , Rfl:    cholestyramine (QUESTRAN) 4 g packet, Take 1 packet (4 g total) by mouth daily., Disp: 60 each, Rfl: 12   cycloSPORINE (RESTASIS) 0.05 % ophthalmic emulsion, Place 1 drop into both eyes 2 (two) times daily., Disp: 1.5 mL, Rfl: 2   ELIQUIS 2.5 MG TABS tablet, TAKE 1 TABLET BY MOUTH TWICE A DAY, Disp: 60 tablet, Rfl: 6   Ensure (ENSURE), Take 237 mLs by mouth 2 (two) times daily between meals., Disp: , Rfl:    FARXIGA 10 MG TABS tablet, TAKE 1 TABLET BY MOUTH DAILY BEFORE BREAKFAST., Disp: 30 tablet, Rfl: 5   folic acid (FOLVITE) 1 MG tablet, TAKE 1 TABLET BY MOUTH EVERY DAY, Disp: 30 tablet, Rfl: 11   Menthol, Topical Analgesic, (ICY HOT BACK EX), Apply 1 application topically daily as needed (pain)., Disp: , Rfl:    MITIGARE 0.6 MG CAPS, Take 0.6 mg by mouth daily as needed (gout)., Disp: , Rfl:    moxifloxacin (VIGAMOX) 0.5 %  ophthalmic solution, Place 1 drop into the left eye 4 (four) times daily., Disp: , Rfl:    Multiple Vitamin (MULTIVITAMIN) tablet, Take 1 tablet by mouth daily., Disp: , Rfl:    omeprazole (PRILOSEC) 40 MG capsule, Take 40 mg by mouth daily., Disp: , Rfl:    polyethylene glycol (MIRALAX / GLYCOLAX) 17 g packet, Take 17 g by mouth as needed., Disp: , Rfl:    predniSONE (DELTASONE) 5 MG tablet, Take 1 tablet (5 mg total) by mouth daily with breakfast., Disp: 30 tablet, Rfl: 1   Vericiguat (VERQUVO) 10 MG TABS, Take 10 mg by mouth daily., Disp: 90 tablet, Rfl: 3   Vitamin D, Ergocalciferol, (DRISDOL) 1.25 MG (50000 UNIT) CAPS capsule,  Take 50,000 Units by mouth once a week. (Patient not taking: Reported on 09/23/2021), Disp: , Rfl:    Allergies  Allergen Reactions   Baclofen Nausea And Vomiting   Penicillin G     Other reaction(s): Unknown   Penicillins Rash     Review of Systems  Constitutional: Negative.   Respiratory: Negative.    Cardiovascular: Negative.   Gastrointestinal: Negative.   Genitourinary:        Rectal bleeding in the last 2 weeks when she wipes. She has not had a colonoscopy since he has been sick.   Neurological: Negative.   Psychiatric/Behavioral: Negative.      Today's Vitals   09/04/21 1145  BP: (!) 98/58  Pulse: 85  Temp: 98.7 F (37.1 C)  TempSrc: Oral  Weight: 104 lb (47.2 kg)  Height: 5' 4.5" (1.638 m)   Body mass index is 17.58 kg/m.  Wt Readings from Last 3 Encounters:  09/23/21 105 lb (47.6 kg)  09/04/21 104 lb (47.2 kg)  09/03/21 104 lb 9.6 oz (47.4 kg)    Objective:  Physical Exam Vitals reviewed.  Constitutional:      General: She is not in acute distress.    Appearance: Normal appearance.  Cardiovascular:     Pulses: Normal pulses.     Heart sounds: Normal heart sounds. No murmur heard. Pulmonary:     Effort: Pulmonary effort is normal. No respiratory distress.     Breath sounds: Normal breath sounds.  Neurological:     General: No focal deficit present.     Mental Status: She is alert and oriented to person, place, and time.     Cranial Nerves: No cranial nerve deficit.     Motor: No weakness.  Psychiatric:        Mood and Affect: Mood normal.        Behavior: Behavior normal.        Thought Content: Thought content normal.        Judgment: Judgment normal.        Assessment And Plan:     1. Prediabetes Comments: Stable, continue Wilder Glade this is also helping wiht her CHF - Hemoglobin A1c  2. 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   3. Rectal  bleeding Comments: She has noticed blood in her stool, will refer to GI - Ambulatory referral to Gastroenterology  4. Chronic kidney disease (CKD), stage IV (severe) ([facility]) Comments: Continue follow up with Nephrology  5. HFrEF (heart failure with reduced ejection fraction) ([place]) Comments: Continue follow up with Cardiology  6. Elevated cholesterol with elevated triglycerides Comments: Continue statin, tolerating well. - Lipid panel     Patient was given opportunity to ask  questions. Patient verbalized understanding of the plan and was able to repeat key elements of the plan. All questions were answered to their satisfaction.  Tracy Brine, [name]   I, Tracy Brine, [name], have reviewed all documentation for this visit. The documentation on 09/04/21 for the exam, diagnosis, procedures, and orders are all accurate and complete.   IF YOU HAVE BEEN REFERRED TO A SPECIALIST, IT MAY TAKE 1-2 WEEKS TO SCHEDULE/PROCESS THE REFERRAL. IF YOU HAVE NOT HEARD FROM US/SPECIALIST IN TWO WEEKS, PLEASE GIVE [place] A CALL AT [phone].   THE PATIENT IS ENCOURAGED TO PRACTICE SOCIAL DISTANCING DUE TO THE COVID-19 PANDEMIC.

## 2021-09-05 LAB — LIPID PANEL
Chol/HDL Ratio: 2.1 ratio (ref 0.0–4.4)
Cholesterol, Total: 209 mg/dL — ABNORMAL HIGH (ref 100–199)
HDL: 98 mg/dL (ref >39)
LDL Chol Calc (NIH): 93 mg/dL (ref 0–99)
Triglycerides: 104 mg/dL (ref 0–149)
VLDL Cholesterol Cal: 18 mg/dL (ref 5–40)

## 2021-09-05 LAB — HEMOGLOBIN A1C
Est. average glucose Bld gHb Est-mCnc: 137 mg/dL
Hgb A1c MFr Bld: 6.4 % — ABNORMAL HIGH (ref 4.8–5.6)

## 2021-09-08 ENCOUNTER — Telehealth: Payer: Self-pay | Admitting: Internal Medicine

## 2021-09-08 NOTE — Telephone Encounter (Signed)
[facility] called the office. They state Dr. Patrice Paradise would like to speak with Dr. Benjamine Mola regarding the patient.  Phone [phone]

## 2021-09-08 NOTE — Telephone Encounter (Signed)
[facility] called the office regarding the patient. They requested the patients last visit's office notes. Fax [phone]

## 2021-09-08 NOTE — Telephone Encounter (Signed)
Last office note faxed.

## 2021-09-08 NOTE — Telephone Encounter (Signed)
FYI- I spoke with Dr. Patrice Paradise about Ms. Khun she wanted to clarify the treatment plan. 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 She also is concerned about hydroxychloroquine as an ideal medication choice due to her degree of eye inflammation and difficulty in exam for monitoring due to this. We can plan to discontinue this at next follow up unless there is a significant change in plan would keep on azathioprine and prednisone for now.

## 2021-09-10 ENCOUNTER — Ambulatory Visit (HOSPITAL_COMMUNITY)
Admission: RE | Admit: 2021-09-10 | Discharge: 2021-09-10 | Disposition: A | Discharge status: Home / Self Care | Payer: Medicare PPO | Source: Ambulatory Visit | Attending: Cardiology | Admitting: Cardiology

## 2021-09-10 DIAGNOSIS — D649 Anemia, unspecified: Secondary | ICD-10-CM | POA: Insufficient documentation

## 2021-09-10 MED ORDER — SODIUM CHLORIDE 0.9 % IV SOLN
510.0000 mg | Freq: Once | INTRAVENOUS | Status: AC
  Administered 2021-09-10: 510 mg via INTRAVENOUS
  Filled 2021-09-10: qty 510

## 2021-09-15 ENCOUNTER — Other Ambulatory Visit: Payer: Self-pay | Admitting: Nurse Practitioner

## 2021-09-22 NOTE — Progress Notes (Signed)
Office Visit Note  Patient: Tracy Nelson             Date of Birth: [birth year]           MRN: [id number]             PCP: [name], [name] Referring: Bary Castilla, NP Visit Date: 09/23/2021   Subjective:   History of Present Illness: Tracy Nelson is a 76 y.o. female here for follow up for seronegative RA complicated with corneal ulcerations we increased azathioprine dose to 75 mg daily and increased prednisone temporarily. I discussed plan with Dr. Patrice Paradise also planning to discontinue hydroxychloroquine over concern about risk and ability to monitor safety. She continues to have eye inflammation worse on the left side. She notices some eye pain and headaches associated with blurry vision. Often around using eye drops or first thing in the morning. She had lab work last month for Dr. Benjamine Mola with normal blood counts renal function appeared slightly worse.  Previous HPI 08/20/21 Tracy Nelson is a 76 y.o. female here for follow up for seronegative RA on HCQ 200 mg daily and prednisone 5 mg daily. Labs checked at hospital December 2022 with eGFR stable at 29. Joint symptoms are not significantly changed since last visit. However she saw ophthalmology in December with increased eye irritation and redness found to have ulceration in the left eye consistent with active RA. She also continued restasis and artificial tears for severe dryness. She reports seeing some darkening or rash on her face does not have any abnormal sensation.   Previous HPI 01/09/21 Tracy Nelson is a 76 y.o. female here for seronegative rheumatoid arthritis previously a patient with Dr. Francee Nodal in [place], [place]. She currently taken hydroxychloroquine 200 mg PO daily and prednisone 5 mg PO daily and has not experienced a severe flare or increase in steroid dosing since about a year ago.  She also experiences sicca symptoms currently treated with Restasis.  She was previously taking Imuran but is not clear if she  is still taking this and how much is contributory to disease control. 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  She has also previously undergone some fibromyalgia tender point injection with significant benefit of back pain symptoms. 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 She has upcoming nephrology appointment next week for her CKD and due to frequent hard sticks prefers if lab draws may be consolidated with frequent bruising and discomfort associated.   Review of Systems  Constitutional:  Positive for fatigue.  HENT:  Positive for mouth dryness. Negative for mouth sores and nose dryness.   Eyes:  Positive for pain, itching and dryness.  Respiratory:  Positive for shortness of breath. Negative for difficulty breathing.   Cardiovascular:  Negative for chest pain and palpitations.  Gastrointestinal:  Positive for blood in stool. Negative for constipation and diarrhea.  Endocrine: Negative for increased urination.  Genitourinary:  Negative for difficulty urinating.  Musculoskeletal:  Positive for myalgias, morning stiffness, muscle tenderness and myalgias. Negative for joint pain, joint pain and joint swelling.  Skin:  Positive for rash. Negative for color change and redness.  Allergic/Immunologic: Negative for susceptible to infections.  Neurological:  Positive for headaches and weakness. Negative for dizziness, numbness and memory loss.  Hematological:  Negative for bruising/bleeding tendency.  Psychiatric/Behavioral:  Negative for confusion.    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   HFrEF (heart failure with reduced ejection fraction) ([place]) 07/15/2020   HTN (hypertension) 07/15/2020   Osteoporosis 07/15/2020   Palpitations 07/15/2020   Acute gout of left shoulder 07/10/2020   Rheumatoid arthritis involving multiple sites with positive rheumatoid factor ([place]) 10/28/2015   Mitral regurgitation 05/30/2014   Acute on chronic combined systolic (congestive) and diastolic (congestive) heart failure ([place]) 05/19/2014   Vitamin D deficiency 05/15/2013   Encounter for long-term (current) use of other medications 12/14/2008   Sjogren's syndrome ([place]) 12/14/2008    Past Medical History:  Diagnosis Date   Acid reflux 07/15/2020   Arthritis 07/15/2020   CHF (congestive heart failure) ([place])    Colitis 07/15/2020   Dilated cardiomyopathy ([place]) 07/15/2020   HFrEF (heart failure with reduced ejection fraction) ([place]) 07/15/2020   HTN (hypertension) 07/15/2020   Osteoporosis 07/15/2020   Palpitations 07/15/2020   Rheumatoid arthritis ([place]) 07/15/2020    Family History  Problem Relation Age of Onset   Hypertension Mother    Diabetes Mother    Hypertension Father    Diabetes Father    Breast cancer Maternal Aunt    Breast cancer Paternal Aunt    Arthritis Maternal Grandmother    Lung disease Paternal Grandfather    Cancer Brother    Past Surgical History:  Procedure Laterality Date   CARDIOVERSION N/A 11/08/2020   Procedure: CARDIOVERSION;  Surgeon: [name], MD;  Location: [facility];  Service: Cardiovascular;  Laterality: N/A;   RIGHT HEART CATH N/A 08/14/2020   Procedure: RIGHT HEART CATH;  Surgeon: [name], MD;  Location: [facility];  Service: Cardiovascular;  Laterality: N/A;   TEE WITHOUT CARDIOVERSION N/A 11/08/2020   Procedure:  TRANSESOPHAGEAL ECHOCARDIOGRAM (TEE);  Surgeon: [name], MD;  Location: [facility];  Service: Cardiovascular;  Laterality: N/A;   Social History   Social History Narrative   Not on file    There is no immunization history on file for this patient.   Objective: Vital Signs: BP 92/71 (BP Location: Left Arm, Patient Position: Sitting, Cuff Size: Normal)    Pulse 87    Ht '5\' 4"'  (1.626 m)    Wt 105 lb (47.6 kg)    BMI 18.02 kg/m    Physical Exam Eyes:     Comments: Left worse than right erythema, no periorbital swelling  Cardiovascular:     Rate and Rhythm: Normal rate and regular rhythm.  Pulmonary:     Effort: Pulmonary effort is normal.     Breath sounds: Normal breath sounds.  Skin:    General: Skin is warm and dry.     Findings: No rash.  Neurological:     Mental Status: She is alert.  Psychiatric:        Mood and Affect: Mood normal.     Musculoskeletal Exam:  Neck full ROM no tenderness Shoulders mildly decreased abduction and external rotation ROM worse left Elbows full ROM no tenderness or swelling Wrists full ROM no tenderness or swelling Right MCPs with chronic enlargement, subluxation, lateral deviation nonreducible, left MCPs reducible  lateral deviation Knees full ROM no tenderness or swelling   CDAI Exam: CDAI Score: -- Patient Global: --; Provider Global: -- Swollen: 0 ; Tender: 0  Joint Exam 09/23/2021   All documented joints were normal     Investigation: No additional findings.  Imaging: No results found.  Recent Labs: Lab Results  Component Value Date   WBC 10.0 09/03/2021   HGB 11.4 (L) 09/03/2021   PLT 282 09/03/2021   NA 135 09/03/2021   K 4.0 09/03/2021   CL 99 09/03/2021   CO2 24 09/03/2021   GLUCOSE 129 (H) 09/03/2021   BUN 54 (H) 09/03/2021   CREATININE 2.61 (H) 09/03/2021   BILITOT 0.6 09/03/2021   ALKPHOS 88 09/03/2021   AST 35 09/03/2021   ALT 35 09/03/2021   PROT 6.2 (L) 09/03/2021   ALBUMIN 3.0 (L)  09/03/2021   CALCIUM 8.7 (L) 09/03/2021    Speciality Comments: No specialty comments available.  Procedures:  No procedures performed Allergies: Baclofen, Penicillin g, and Penicillins   Assessment / Plan:     Visit Diagnoses: Rheumatoid arthritis involving multiple sites with positive rheumatoid factor ([place]) - Plan: azaTHIOprine (IMURAN) 50 MG tablet, predniSONE (DELTASONE) 5 MG tablet  Joint disease is doing fine, ocular involvement primary concern. Based on recommendations from [place] and I agree HCQ unlikely doing much benefit relative to risks will stop this. Increasing azathioprine to 100 mg daily which is about 2 mg/kg daily. Continuing prednisone 5 mg daily.  Stage 3 chronic kidney disease, unspecified whether stage 3a or 3b CKD ([place])  Would not titrate imuran above current step based on weight and renal function, although no evidence for strict dose modification requirement.  Corneal melt, bilateral - Plan: azaTHIOprine (IMURAN) 50 MG tablet, predniSONE (DELTASONE) 5 MG tablet    Orders: No orders of the defined types were placed in this encounter.  Meds ordered this encounter  Medications   azaTHIOprine (IMURAN) 50 MG tablet    Sig: Take 2 tablets (100 mg total) by mouth daily.    Dispense:  60 tablet    Refill:  1   predniSONE (DELTASONE) 5 MG tablet    Sig: Take 1 tablet (5 mg total) by mouth daily with breakfast.    Dispense:  30 tablet    Refill:  1     Follow-Up Instructions: Return in about 4 weeks (around 10/21/2021) for RA/corneal melt AZA titration/HCQ stop f/u 4wks.   [name], MD  Note - This record has been created using [employer].  Chart creation errors have been sought, but may not always  have been located. Such creation errors do not reflect on  the standard of medical care.

## 2021-09-23 ENCOUNTER — Encounter: Payer: Self-pay | Admitting: Internal Medicine

## 2021-09-23 ENCOUNTER — Other Ambulatory Visit: Payer: Self-pay

## 2021-09-23 ENCOUNTER — Ambulatory Visit: Payer: Medicare PPO | Admitting: Internal Medicine

## 2021-09-23 VITALS — BP 92/71 | HR 87 | Ht 64.0 in | Wt 105.0 lb

## 2021-09-23 DIAGNOSIS — N183 Chronic kidney disease, stage 3 unspecified: Secondary | ICD-10-CM

## 2021-09-23 DIAGNOSIS — M0579 Rheumatoid arthritis with rheumatoid factor of multiple sites without organ or systems involvement: Secondary | ICD-10-CM

## 2021-09-23 DIAGNOSIS — H16003 Unspecified corneal ulcer, bilateral: Secondary | ICD-10-CM

## 2021-09-23 MED ORDER — AZATHIOPRINE 50 MG PO TABS: 100.0000 mg | ORAL_TABLET | Freq: Every day | ORAL | 1 refills | Status: DC

## 2021-09-23 MED ORDER — PREDNISONE 5 MG PO TABS: 5.0000 mg | ORAL_TABLET | Freq: Every day | ORAL | 1 refills | Status: DC

## 2021-09-25 ENCOUNTER — Telehealth: Payer: Medicare PPO

## 2021-09-26 ENCOUNTER — Encounter (HOSPITAL_COMMUNITY): Payer: Medicare PPO

## 2021-09-28 ENCOUNTER — Other Ambulatory Visit (HOSPITAL_COMMUNITY): Payer: Self-pay | Admitting: Cardiology

## 2021-10-10 ENCOUNTER — Telehealth: Payer: Self-pay

## 2021-10-10 ENCOUNTER — Telehealth: Payer: Medicare PPO

## 2021-10-10 NOTE — Chronic Care Management (AMB) (Signed)
? ? ?Chronic Care Management ?Pharmacy Assistant  ? ?Name: Tracy Nelson  MRN: [id number] DOB: November 26, 1945 ? ?Reason for Encounter: Disease State/ Cholesterol ? ?Recent office visits:  ?09-04-2021 Tracy Nelson, Tracy Nelson. A1C= 6.4. Cholesterol= 209. Referral to gastroenterology. START nystatin 5 mLs 4 times daily for 14 days. ? ?Recent consult visits:  ?09-23-2021 Tracy Salina, MD (Rheumatology). INCREASE Azathioprine 75 mg daily to 100 mg daily.  ? ?Hospital visits:  ?None in previous 6 months ? ?Medications: ?Outpatient Encounter Medications as of 10/10/2021  ?Medication Sig  ? acetaminophen (TYLENOL) 500 MG tablet Take 500-1,000 mg by mouth every 6 (six) hours as needed for moderate pain.  ? amiodarone (PACERONE) 200 MG tablet Take 1 tablet (200 mg total) by mouth daily.  ? azaTHIOprine (IMURAN) 50 MG tablet Take 2 tablets (100 mg total) by mouth daily.  ? bumetanide (BUMEX) 2 MG tablet Take 2 mg by mouth 2 (two) times daily.  ? Cholecalciferol (VITAMIN D-3 PO) Take 25 mcg by mouth 3 (three) times a week.  ? cholestyramine (QUESTRAN) 4 g packet Take 1 packet (4 g total) by mouth daily.  ? cycloSPORINE (RESTASIS) 0.05 % ophthalmic emulsion Place 1 drop into both eyes 2 (two) times daily.  ? ELIQUIS 2.5 MG TABS tablet TAKE 1 TABLET BY MOUTH TWICE A DAY  ? Ensure (ENSURE) Take 237 mLs by mouth 2 (two) times daily between meals.  ? FARXIGA 10 MG TABS tablet TAKE 1 TABLET BY MOUTH DAILY BEFORE BREAKFAST.  ? folic acid (FOLVITE) 1 MG tablet TAKE 1 TABLET BY MOUTH EVERY DAY  ? Menthol, Topical Analgesic, (ICY HOT BACK EX) Apply 1 application topically daily as needed (pain).  ? MITIGARE 0.6 MG CAPS Take 0.6 mg by mouth daily as needed (gout).  ? moxifloxacin (VIGAMOX) 0.5 % ophthalmic solution Place 1 drop into the left eye 4 (four) times daily.  ? Multiple Vitamin (MULTIVITAMIN) tablet Take 1 tablet by mouth daily.  ? omeprazole (PRILOSEC) 40 MG capsule Take 40 mg by mouth daily.  ? polyethylene glycol (MIRALAX /  GLYCOLAX) 17 g packet Take 17 g by mouth as needed.  ? predniSONE (DELTASONE) 5 MG tablet Take 1 tablet (5 mg total) by mouth daily with breakfast.  ? Vericiguat (VERQUVO) 10 MG TABS Take 10 mg by mouth daily.  ? Vitamin D, Ergocalciferol, (DRISDOL) 1.25 MG (50000 UNIT) CAPS capsule Take 50,000 Units by mouth once a week. (Patient not taking: Reported on 09/23/2021)  ? ?No facility-administered encounter medications on file as of 10/10/2021.  ? ?Reviewed chart prior to disease state call. Spoke with patient regarding BP ? ?Recent Office Vitals: ?BP Readings from Last 3 Encounters:  ?09/23/21 92/71  ?09/10/21 97/62  ?09/04/21 (!) 98/58  ? ?Pulse Readings from Last 3 Encounters:  ?09/23/21 87  ?09/10/21 73  ?09/04/21 85  ?  ?Wt Readings from Last 3 Encounters:  ?09/23/21 105 lb (47.6 kg)  ?09/04/21 104 lb (47.2 kg)  ?09/03/21 104 lb 9.6 oz (47.4 kg)  ?  ? ?Kidney Function ?Lab Results  ?Component Value Date/Time  ? CREATININE 2.61 (H) 09/03/2021 12:25 PM  ? CREATININE 1.78 (H) 07/18/2021 12:55 PM  ? GFRNONAA 18 (L) 09/03/2021 12:25 PM  ? ? ?BMP Latest Ref Rng & Units 09/03/2021 07/18/2021 06/04/2021  ?Glucose 70 - 99 mg/dL 129(H) 103(H) 98  ?BUN 8 - 23 mg/dL 54(H) 31(H) 48(H)  ?Creatinine 0.44 - 1.00 mg/dL 2.61(H) 1.78(H) 1.96(H)  ?BUN/Creat Ratio 12 - 28 - - 24  ?Sodium 135 -  145 mmol/L 135 135 143  ?Potassium 3.5 - 5.1 mmol/L 4.0 4.3 4.1  ?Chloride 98 - 111 mmol/L 99 102 103  ?CO2 22 - 32 mmol/L 24 25 23   ?Calcium 8.9 - 10.3 mg/dL 8.7(L) 8.7(L) 9.4  ? ? ?Current antihypertensive regimen:  ?Vericiguat 10 mg tablet -once per day ?Amiodarone 200 mg tablet by mouth daily ?Bumex 1 mg - 2 mg in the morning and 1 mg in the evening  ? ?How often are you checking your Blood Pressure? daily ? ?Current home BP readings: 93/65, 93/62 ? ?What recent interventions/DTPs have been made by any provider to improve Blood Pressure control since last CPP Visit:  ?Educated on Importance of home blood pressure monitoring; ?Proper BP monitoring  technique; ?Symptoms of hypotension and importance of maintaining adequate hydration; ?-Counseled to monitor BP at home at l, document, and provide log at future appointments ?-Recommended to continue current medication ? ?Any recent hospitalizations or ED visits since last visit with CPP? No ? ?What diet changes have been made to improve Blood Pressure Control?  ?Patient states she has limited her sat intake and tries to drink water daily. ? ?What exercise is being done to improve your Blood Pressure Control?  ?Patient states she walks around some at home. ? ?Adherence Review: ?Is the patient currently on ACE/ARB medication? No ?Does the patient have >5 day gap between last estimated fill dates? No ? ?Care Gaps: ?Covid vaccine overdue ?Shingrix overdue ? ?Star Rating Drugs: ?Farxiga 10 mg- Last filled 09-03-2021 [facility] (Patient stated she has 1 pill left and is [facility]) ? ?Jeannette How CMA ?Clinical Pharmacist Assistant ?[phone] ? ?

## 2021-10-15 ENCOUNTER — Encounter (HOSPITAL_COMMUNITY): Payer: Medicare PPO

## 2021-10-20 ENCOUNTER — Encounter (HOSPITAL_COMMUNITY)
Admission: RE | Admit: 2021-10-20 | Discharge: 2021-10-20 | Disposition: A | Discharge status: Home / Self Care | Payer: Medicare PPO | Source: Ambulatory Visit | Attending: Cardiology | Admitting: Cardiology

## 2021-10-20 ENCOUNTER — Other Ambulatory Visit: Payer: Self-pay

## 2021-10-20 DIAGNOSIS — I5020 Unspecified systolic (congestive) heart failure: Secondary | ICD-10-CM | POA: Insufficient documentation

## 2021-10-20 MED ORDER — TECHNETIUM TC 99M PYROPHOSPHATE
21.2000 | Freq: Once | INTRAVENOUS | Status: AC | PRN
  Administered 2021-10-20: 21.2 via INTRAVENOUS
  Filled 2021-10-20: qty 22

## 2021-10-21 LAB — HM DIABETES EYE EXAM

## 2021-10-22 ENCOUNTER — Ambulatory Visit
Admission: RE | Admit: 2021-10-22 | Discharge: 2021-10-22 | Disposition: A | Discharge status: Home / Self Care | Payer: Medicare PPO | Source: Ambulatory Visit | Attending: Internal Medicine | Admitting: Internal Medicine

## 2021-10-22 DIAGNOSIS — M810 Age-related osteoporosis without current pathological fracture: Secondary | ICD-10-CM

## 2021-10-28 ENCOUNTER — Ambulatory Visit: Payer: Medicare PPO | Admitting: Internal Medicine

## 2021-11-01 ENCOUNTER — Encounter (HOSPITAL_BASED_OUTPATIENT_CLINIC_OR_DEPARTMENT_OTHER): Payer: Self-pay

## 2021-11-01 ENCOUNTER — Other Ambulatory Visit: Payer: Self-pay

## 2021-11-01 ENCOUNTER — Emergency Department (HOSPITAL_BASED_OUTPATIENT_CLINIC_OR_DEPARTMENT_OTHER): Payer: Medicare PPO

## 2021-11-01 ENCOUNTER — Emergency Department (HOSPITAL_BASED_OUTPATIENT_CLINIC_OR_DEPARTMENT_OTHER)
Admission: EM | Admit: 2021-11-01 | Discharge: 2021-11-01 | Disposition: A | Discharge status: Home / Self Care | Payer: Medicare PPO | Attending: Emergency Medicine | Admitting: Emergency Medicine

## 2021-11-01 DIAGNOSIS — I110 Hypertensive heart disease with heart failure: Secondary | ICD-10-CM | POA: Insufficient documentation

## 2021-11-01 DIAGNOSIS — R0789 Other chest pain: Secondary | ICD-10-CM | POA: Insufficient documentation

## 2021-11-01 DIAGNOSIS — Z79899 Other long term (current) drug therapy: Secondary | ICD-10-CM | POA: Diagnosis not present

## 2021-11-01 DIAGNOSIS — R21 Rash and other nonspecific skin eruption: Secondary | ICD-10-CM | POA: Insufficient documentation

## 2021-11-01 DIAGNOSIS — R519 Headache, unspecified: Secondary | ICD-10-CM | POA: Insufficient documentation

## 2021-11-01 DIAGNOSIS — I509 Heart failure, unspecified: Secondary | ICD-10-CM | POA: Insufficient documentation

## 2021-11-01 DIAGNOSIS — Z7901 Long term (current) use of anticoagulants: Secondary | ICD-10-CM | POA: Insufficient documentation

## 2021-11-01 LAB — BASIC METABOLIC PANEL
Anion gap: 14 (ref 5–15)
BUN: 49 mg/dL — ABNORMAL HIGH (ref 8–23)
CO2: 24 mmol/L (ref 22–32)
Calcium: 9.4 mg/dL (ref 8.9–10.3)
Chloride: 101 mmol/L (ref 98–111)
Creatinine, Ser: 2.29 mg/dL — ABNORMAL HIGH (ref 0.44–1.00)
GFR, Estimated: 22 mL/min — ABNORMAL LOW (ref >60)
Glucose, Bld: 116 mg/dL — ABNORMAL HIGH (ref 70–99)
Potassium: 3.7 mmol/L (ref 3.5–5.1)
Sodium: 139 mmol/L (ref 135–145)

## 2021-11-01 LAB — CBC
HCT: 37.9 % (ref 36.0–46.0)
Hemoglobin: 12.1 g/dL (ref 12.0–15.0)
MCH: 29.7 pg (ref 26.0–34.0)
MCHC: 31.9 g/dL (ref 30.0–36.0)
MCV: 92.9 fL (ref 80.0–100.0)
Platelets: 222 10*3/uL (ref 150–400)
RBC: 4.08 MIL/uL (ref 3.87–5.11)
RDW: 16.9 % — ABNORMAL HIGH (ref 11.5–15.5)
WBC: 8.1 10*3/uL (ref 4.0–10.5)
nRBC: 0 % (ref 0.0–0.2)

## 2021-11-01 LAB — TROPONIN I (HIGH SENSITIVITY): Troponin I (High Sensitivity): 15 ng/L (ref <18)

## 2021-11-01 MED ORDER — TRAMADOL HCL 50 MG PO TABS: 50.0000 mg | ORAL_TABLET | Freq: Two times a day (BID) | ORAL | 0 refills | Status: DC | PRN

## 2021-11-01 MED ORDER — ACYCLOVIR 200 MG PO CAPS
800.0000 mg | ORAL_CAPSULE | Freq: Once | ORAL | Status: AC
  Administered 2021-11-01: 800 mg via ORAL
  Filled 2021-11-01: qty 4

## 2021-11-01 MED ORDER — ONDANSETRON 4 MG PO TBDP
4.0000 mg | ORAL_TABLET | Freq: Once | ORAL | Status: AC
  Administered 2021-11-01: 4 mg via ORAL
  Filled 2021-11-01: qty 1

## 2021-11-01 MED ORDER — ONDANSETRON HCL 4 MG PO TABS: 4.0000 mg | ORAL_TABLET | Freq: Four times a day (QID) | ORAL | 0 refills | Status: DC

## 2021-11-01 MED ORDER — ACYCLOVIR 800 MG PO TABS: 800.0000 mg | ORAL_TABLET | Freq: Three times a day (TID) | ORAL | 0 refills | Status: AC

## 2021-11-01 MED ORDER — HYDROCODONE-ACETAMINOPHEN 5-325 MG PO TABS
1.0000 | ORAL_TABLET | Freq: Once | ORAL | Status: AC
  Administered 2021-11-01: 1 via ORAL
  Filled 2021-11-01: qty 1

## 2021-11-01 NOTE — Discharge Instructions (Addendum)
Please take the acyclovir as recommended.  Make sure to stay well-hydrated while taking this medication.   ? ?Try not to touch the rash as we do not want this to spread.  If the rash starts to spread to your face or you start having any eye pain and the need to be seen in the emergency department immediately.  ? ?I would recommend that you follow-up with your regular doctor or with a dermatologist in the next 3-5 days for further evaluation of your symptoms and return to the ED for any new or worsening symptoms in the meantime. ?

## 2021-11-01 NOTE — ED Triage Notes (Addendum)
Pt arrives with c/o headache that started yesterday. Per pt, OTC meds did not help. Pt also c/o chest pain that started yesterday. Pt denies n/v. Pt endorse SOB. Pt does take a blood thinner(eliqus).  ?

## 2021-11-01 NOTE — ED Provider Notes (Addendum)
?[facility] ?Provider Note ? ? ?CSN: [id number] ?Arrival date & time: 11/01/21  1920 ? ?  ? ?History ? ?Chief Complaint  ?Patient presents with  ? Headache  ? Chest Pain  ? ? ?Tracy Nelson is a 76 y.o. female. ? ?HPI ? ?76 year old female with a history of arthritis, acid reflux, colitis, dilated cardiomyopathy, hypertension, osteoporosis, palpitations, rheumatoid arthritis, CHF, who presents to the emergency department today for evaluation of pain to the left side of her head neck and chest that started yesterday.  States she had some itching and pain to the area yesterday which worsened today.  She also has a new rash to the area.  She reports chronic shortness of breath that is unchanged due to her history of CHF.  She states that her headache is located to the left side of the head.  She does not have a history of headaches.  She denies any new vision changes or unilateral numbness or weakness.  Denies any other new neurologic complaints.  She denies any fevers or chills ? ?Home Medications ?Prior to Admission medications   ?Medication Sig Start Date End Date Taking? Authorizing Provider  ?acyclovir (ZOVIRAX) 800 MG tablet Take 1 tablet (800 mg total) by mouth every 8 (eight) hours for 7 days. 11/01/21 11/08/21 Yes Patrick Salemi S, PA-C  ?ondansetron (ZOFRAN) 4 MG tablet Take 1 tablet (4 mg total) by mouth every 6 (six) hours. 11/01/21  Yes Trinitee Horgan S, PA-C  ?traMADol (ULTRAM) 50 MG tablet Take 1 tablet (50 mg total) by mouth every 12 (twelve) hours as needed. 11/01/21  Yes Jorden Minchey S, PA-C  ?acetaminophen (TYLENOL) 500 MG tablet Take 500-1,000 mg by mouth every 6 (six) hours as needed for moderate pain.    [provider]  ?amiodarone (PACERONE) 200 MG tablet Take 1 tablet (200 mg total) by mouth daily. 11/12/20   [name], MD  ?azaTHIOprine (IMURAN) 50 MG tablet Take 2 tablets (100 mg total) by mouth daily. 09/23/21   Rice, Resa Miner, MD   ?bumetanide (BUMEX) 2 MG tablet Take 2 mg by mouth 2 (two) times daily.    [provider]  ?Cholecalciferol (VITAMIN D-3 PO) Take 25 mcg by mouth 3 (three) times a week.    [provider]  ?cholestyramine (QUESTRAN) 4 g packet Take 1 packet (4 g total) by mouth daily. 08/08/20   [name], MD  ?cycloSPORINE (RESTASIS) 0.05 % ophthalmic emulsion Place 1 drop into both eyes 2 (two) times daily. 11/14/20   Bary Castilla, NP  ?ELIQUIS 2.5 MG TABS tablet TAKE 1 TABLET BY MOUTH TWICE A DAY 06/13/21   [name], MD  ?Ensure (ENSURE) Take 237 mLs by mouth 2 (two) times daily between meals.    [provider]  ?FARXIGA 10 MG TABS tablet TAKE 1 TABLET BY MOUTH DAILY BEFORE BREAKFAST. 05/12/21   [name], MD  ?folic acid (FOLVITE) 1 MG tablet TAKE 1 TABLET BY MOUTH EVERY DAY 07/30/21   [name], MD  ?Menthol, Topical Analgesic, (ICY HOT BACK EX) Apply 1 application topically daily as needed (pain).    [provider]  ?MITIGARE 0.6 MG CAPS Take 0.6 mg by mouth daily as needed (gout). 07/18/20   [provider]  ?moxifloxacin (VIGAMOX) 0.5 % ophthalmic solution Place 1 drop into the left eye 4 (four) times daily. 08/07/21   [provider]  ?Multiple Vitamin (MULTIVITAMIN) tablet Take 1 tablet by mouth daily.  [provider]  ?omeprazole (PRILOSEC) 40 MG capsule Take 40 mg by mouth daily.    [provider]  ?polyethylene glycol (MIRALAX / GLYCOLAX) 17 g packet Take 17 g by mouth as needed.    [provider]  ?predniSONE (DELTASONE) 5 MG tablet Take 1 tablet (5 mg total) by mouth daily with breakfast. 09/23/21   Rice, Resa Miner, MD  ?Vericiguat (VERQUVO) 10 MG TABS Take 10 mg by mouth daily. 09/03/21   [name], MD  ?Vitamin D, Ergocalciferol, (DRISDOL) 1.25 MG (50000 UNIT) CAPS capsule Take 50,000 Units by mouth once a week. ?Patient not taking: Reported on 09/23/2021 07/29/21   [provider]  ?   ? ?Allergies    ?Baclofen, Penicillin g, and Penicillins   ? ?Review of Systems   ?Review of Systems ?See HPI for pertinent positives or negatives. ? ? ?Physical Exam ?Updated Vital Signs ?BP 105/79   Pulse 81   Temp 98.7 ?F (37.1 ?C)   Resp 18   Ht '5\' 2"'$  (1.575 m)   Wt 44.5 kg   SpO2 96%   BMI 17.92 kg/m?  ?Physical Exam ?Vitals and nursing note reviewed.  ?Constitutional:   ?   General: She is not in acute distress. ?   Appearance: She is well-developed.  ?HENT:  ?   Head: Normocephalic and atraumatic.  ?   Ears:  ?   Comments: No lesions noted to the left tm or eac ?Eyes:  ?   Conjunctiva/sclera: Conjunctivae normal.  ?Cardiovascular:  ?   Rate and Rhythm: Normal rate and regular rhythm.  ?   Heart sounds: No murmur heard. ?Pulmonary:  ?   Effort: Pulmonary effort is normal. No respiratory distress.  ?   Breath sounds: Normal breath sounds.  ?Abdominal:  ?   Palpations: Abdomen is soft.  ?   Tenderness: There is no abdominal tenderness.  ?Musculoskeletal:     ?   General: No swelling.  ?   Cervical back: Normal range of motion and neck supple.  ?Skin: ?   General: Skin is warm and dry.  ?   Capillary Refill: Capillary refill takes less than 2 seconds.  ?   Findings: Rash present.  ?   Comments: Erythematous patchy rash noted to the left side of the neck and chest. The rash does not involve the face and does not cross the midline. Rash is tender to touch  ?Neurological:  ?   Mental Status: She is alert.  ?Psychiatric:     ?   Mood and Affect: Mood normal.  ? ? ? ? ? ? ? ?ED Results / Procedures / Treatments   ?Labs ?(all labs ordered are listed, but only abnormal results are displayed) ?Labs Reviewed  ?BASIC METABOLIC PANEL - Abnormal; Notable for the following components:  ?    Result Value  ? Glucose, Bld 116 (*)   ? BUN 49 (*)   ? Creatinine, Ser 2.29 (*)   ? GFR, Estimated 22 (*)   ? All other components within normal limits  ?CBC - Abnormal; Notable for the following components:  ? RDW 16.9 (*)    ? All other components within normal limits  ?TROPONIN I (HIGH SENSITIVITY)  ?TROPONIN I (HIGH SENSITIVITY)  ? ? ?EKG ?None ? ?Radiology ?CT Head Wo Contrast ? ?Result Date: 11/01/2021 ?CLINICAL DATA:  Headache and chest pain. EXAM: CT HEAD WITHOUT CONTRAST TECHNIQUE: Contiguous axial images were obtained from the base of the skull through  the vertex without intravenous contrast. RADIATION DOSE REDUCTION: This exam was performed according to the departmental dose-optimization program which includes automated exposure control, adjustment of the mA and/or kV according to patient size and/or use of iterative reconstruction technique. COMPARISON:  None. FINDINGS: Brain: There is mild cerebral atrophy with widening of the extra-axial spaces and ventricular dilatation. There are areas of decreased attenuation within the white matter tracts of the supratentorial brain, consistent with microvascular disease changes. Vascular: No hyperdense vessel or unexpected calcification. Skull: Normal. Negative for fracture or focal lesion. Sinuses/Orbits: No acute finding. Other: None. IMPRESSION: No acute intracranial abnormality. Electronically Signed   By: Virgina Norfolk M.D.   On: 11/01/2021 21:27  ? ?DG Chest Port 1 View ? ?Result Date: 11/01/2021 ?CLINICAL DATA:  Headache and chest pain. EXAM: PORTABLE CHEST 1 VIEW COMPARISON:  October 18, 2020 FINDINGS: Multiple overlying radiopaque cardiac lead wires are seen. The cardiac silhouette is borderline in size. Both lungs are clear. There is moderate to marked severity dextroscoliosis of the thoracic spine. IMPRESSION: No active cardiopulmonary disease. Electronically Signed   By: Virgina Norfolk M.D.   On: 11/01/2021 21:29   ? ?Procedures ?Procedures  ? ? ?Medications Ordered in ED ?Medications  ?ondansetron (ZOFRAN-ODT) disintegrating tablet 4 mg (has no administration in time range)  ?acyclovir (ZOVIRAX) 200 MG capsule 800 mg (800 mg Oral Given 11/01/21 2208)   ?HYDROcodone-acetaminophen (NORCO/VICODIN) 5-325 MG per tablet 1 tablet (1 tablet Oral Given 11/01/21 2208)  ? ? ?ED Course/ Medical Decision Making/ A&P ?  ?                        ?Medical Decision Making ?Amount and/or Complexity of D

## 2021-11-02 ENCOUNTER — Other Ambulatory Visit (HOSPITAL_COMMUNITY): Payer: Self-pay | Admitting: Cardiology

## 2021-11-02 ENCOUNTER — Other Ambulatory Visit: Payer: Self-pay | Admitting: Internal Medicine

## 2021-11-02 DIAGNOSIS — M0579 Rheumatoid arthritis with rheumatoid factor of multiple sites without organ or systems involvement: Secondary | ICD-10-CM

## 2021-11-02 DIAGNOSIS — H16003 Unspecified corneal ulcer, bilateral: Secondary | ICD-10-CM

## 2021-11-03 NOTE — Telephone Encounter (Signed)
Next Visit: 12/01/2021 ? ?Last Visit: 09/23/2021 ? ?Last Fill:09/23/2021 ? ?Dx: Rheumatoid arthritis involving multiple sites with positive rheumatoid factor  ? ?Current Dose per office note on 09/23/2021: predniSONE (DELTASONE) 5 MG tablet ? ?Okay to refill Prednisone?   ?

## 2021-11-05 ENCOUNTER — Telehealth (HOSPITAL_COMMUNITY): Payer: Self-pay | Admitting: Licensed Clinical Social Worker

## 2021-11-05 NOTE — Progress Notes (Signed)
? ? ? ?Advanced [facility] Progress Note  ? ? ?Primary Cardioloigist: Dr. Aundra Dubin ? ?76 y.o. with history of rheumatoid arthritis, CKD stage 3, and nonischemic cardiomyopathy was referred by Dr. Andrey Cota in [place] to establish heart failure care in [place].  Patient has been known to have a cardiomyopathy since 2015.  Cardiac MRI in 2015 showed EF 37% with no LGE.  Coronary angiography at that time showed no significant coronary disease.  Over the next few years, LV EF fluctuated up and down.  In 3/21, echo showed EF down to 20-25%.  She was admitted to the hospital in [place], [place] in 11/21 with CHF exacerbation.  Echo showed EF 20-25%, global hypokinesis, mildly decreased RV systolic function.  Creatinine was noted to be elevated. She was diuresed and sent home, but was readmitted later in 11/21 with ongoing volume overload. Creatinine was up to 2.89.  She was taken off most of her cardiac meds due to soft BP and elevated creatinine. She was discharged to her daughter's house in [place].  ? ?I repeated an echo on her in 12/21, EF < 20% with moderate LV dilation and mildly decreased RV systolic function. RHC was done, surprisingly showing low filling pressures and relatively preserved cardiac index at 2.22.  ? ?She was admitted in 3/22 with atrial fibrillation, converted to NSR on amiodarone. She went into atrial flutter after this and had TEE-DCCV in 4/22.  TEE showed EF < 20%, moderate LV dilation, mild RV dilation with moderate RV dysfunction, moderate central MR.  CPX in 3/22 showed severe HF limitation. ? ?PYP scan 4/22 was grade 1 with H/CL 1.48, equivocal.   ? ?She was evaluated for [facility] or ANTHEM, BNP too high and thought to be too frail.  ? ?Cardiac MRI in 10/22 showed mild LV dilation with EF 23%, normal RV size with RVEF 37%, ECV 34%, small area of subendocardial LGE in the mid inferolateral wall and small area of full thickness LGE in the basal  ?inferior wall.  ? ?Last visit w/  Dr. Aundra Dubin 1/23,  bumetanide was decreased to 2 qam/1 qpm given bump in SCr. She had also complained of BRBPR on toilet paper after whipping. Hgb was 11.4. Iron stores were low and she was referred for IV feraheme. She was instructed to f/u w/ APP in 6 weeks but ended up canceling the appt  ? ?Recently seen in the ED this past weekend on 3/25 for left sided chest/shoulder pain and headache + left upper shoulder rash. Head CT was negative. CP w/u also unremarkable. Hs trop 15. ACS felt to be unlikely.  ?Per ED provider note, her rash seemed like it could be consistent with a possible shingles infection though it did not involve multiple dermatomes.  She was placed empirically on antiviral medications and also provided w/ pain medications and instructed to f/u w/ her PCP.  ? ?Present to clinic today. Continues w/ rash and left upper shoulder pain. Based on appearance agree w/ likely shingles diagnosis. She continues on acyclovir and tramadol for pain. Has not had f/u w/ PCP yet.  ? ?Since starting acyclovir, she has felt nauseated. Poor appetite and PO intake. Also complains of constipation. Last BMP was 3 days ago. Wt is down 7 lb since last visit. Still taking diuretics. BP soft 94/50. No positional dizziness.  ? ?Denies resting dyspnea. NYHA Class III chronically.  ? ?ECG: not performed  ? ?Labs (12/21): K 5 => 3.7, creatinine 2.89 => 1.9 => 1.69, BNP  2904 ?Labs (1/22): K 4, creatinine 1.9, hgb 12.5 ?Labs (3/22): K 3.6, creatinine 2.36, hgb 11.6, TSH normal ?Labs (4/22): K 3.7, creatinine 2.25, LFTs normal, urine immunofixation normal, pro-BNP 6498 ?Labs (7/22): Myeloma panel with IgG monoclonal protein ?Labs (10/22): K 4.1, creatinine 1.96 ?Labs (12/22): BNP 549, TSH normal, K 4.3, creatinine 1.78 ?Labs (1/23): SCr 2.61, K 4.0, Hgb 11.4  ?Lab (3/23): Scr 2.29, K 3.9  ? ?PMH: ?1. GERD ?2. Rheumatoid arthritis: Followed by rheumatology in [place].  ?- No known pulmonary disease from RA.  ?3. Osteoporosis. ?4.  Gout ?5. CKD stage 4 ?6. H/o SVT ?7. Chronic systolic CHF: She was found to have nonischemic cardiomyopathy with low EF in 2015.  ?- LHC (2015): Normal coronaries.  ?- cardiac MRI (2015): EF 37%, no LGE ?- Echo (3/21): EF 20-25%. ?- Echo (11/21): EF 20-25%, moderate LV dilation, global hypokinesis, biatrial enlargement, mildly decreased RV systolic function.  ?- Echo (12/21): EF < 20%, moderate LV dilation, normal RV size/systolic function, moderate MR, IVC normal.  ?- RHC (1/22): Mean RA 1, PA 39/14, mean PCWP 10, PVR 3.9 WU, CI 2.22.  ?- CPX (3/22): peak VO2 10.1, VE/VCO2 55, RER 1.13.  Severe functional limitation due to HF.  ?- TEE (4/22): EF < 20%, moderate LV dilation, mild RV dilation with moderate RV dysfunction, moderate central MR. ?- PYP scan (4/22): grade 1 with H/CL 1.48. Equivocal. [employer] gene testing negative. ?- Cardiac MRI (10/22): mild LV dilation with EF 23%, normal RV size with RVEF 37%, ECV 34%, small area of subendocardial LGE in the mid inferolateral wall and small area of full thickness LGE in the basal inferior wall.  ?8. Atrial fibrillation/flutter: TEE-DCCV in 4/22.  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  ? ?SH: From [place] but recently moved to [place] to live with daughter.  Has 3 children.  Nonsmoker.  No ETOH.  ? ?FH: Mother with CHF, father with MI.  ? ?ROS: All systems reviewed and negative except as per HPI.  ? ?Current Outpatient Medications  ?Medication Sig Dispense Refill  ? acetaminophen (TYLENOL) 500 MG tablet Take 500-1,000 mg by mouth every 6 (six) hours as needed for moderate pain.    ? acyclovir (ZOVIRAX) 800 MG tablet Take 1 tablet (800 mg total) by mouth every 8 (eight) hours for 7 days. 21 tablet 0  ? amiodarone (PACERONE) 200 MG tablet Take 1 tablet (200 mg total) by mouth daily. 60 tablet 6  ? azaTHIOprine (IMURAN) 50 MG tablet Take 2 tablets (100 mg total) by mouth daily. 60 tablet 1  ? bumetanide (BUMEX) 2 MG tablet Take 2 mg by mouth  2 (two) times daily.    ? Cholecalciferol (VITAMIN D-3 PO) Take 25 mcg by mouth 3 (three) times a week.    ? cholestyramine (QUESTRAN) 4 g packet Take 1 packet (4 g total) by mouth daily. 60 each 12  ? cycloSPORINE (RESTASIS) 0.05 % ophthalmic emulsion Place 1 drop into both eyes 2 (two) times daily. 1.5 mL 2  ? ELIQUIS 2.5 MG TABS tablet TAKE 1 TABLET BY MOUTH TWICE A DAY 60 tablet 6  ? Ensure (ENSURE) Take 237 mLs by mouth 2 (two) times daily between meals.    ? FARXIGA 10 MG TABS tablet TAKE 1 TABLET BY MOUTH DAILY BEFORE BREAKFAST. 30 tablet 5  ? folic acid (FOLVITE) 1 MG tablet TAKE 1 TABLET BY MOUTH EVERY DAY 30 tablet 11  ? Menthol, Topical Analgesic, (ICY HOT BACK EX)  Apply 1 application topically daily as needed (pain).    ? MITIGARE 0.6 MG CAPS Take 0.6 mg by mouth daily as needed (gout).    ? Multiple Vitamin (MULTIVITAMIN) tablet Take 1 tablet by mouth daily.    ? omeprazole (PRILOSEC) 40 MG capsule Take 40 mg by mouth daily.    ? ondansetron (ZOFRAN) 4 MG tablet Take 1 tablet (4 mg total) by mouth every 6 (six) hours. 12 tablet 0  ? polyethylene glycol (MIRALAX / GLYCOLAX) 17 g packet Take 17 g by mouth as needed.    ? predniSONE (DELTASONE) 5 MG tablet TAKE 1 TABLET BY MOUTH EVERY DAY WITH BREAKFAST 30 tablet 1  ? traMADol (ULTRAM) 50 MG tablet Take 1 tablet (50 mg total) by mouth every 12 (twelve) hours as needed. 6 tablet 0  ? Vericiguat (VERQUVO) 10 MG TABS Take 10 mg by mouth daily. 90 tablet 3  ? ?No current facility-administered medications for this encounter.  ? ?BP 102/64   Pulse 93   Wt 44.4 kg (97 lb 12.8 oz)   SpO2 98%   BMI 17.89 kg/m?  ?PHYSICAL EXAM: ?General:  thin/frail appearing elderly female in [place]. No respiratory difficulty ?HEENT: normal ?Neck: supple. no JVD. Carotids 2+ bilat; no bruits. No lymphadenopathy or thyromegaly appreciated. + shingles rash left anterior shoulder left lateral/posterior neck  ?Cor: PMI nondisplaced. Regular rate & rhythm. No rubs, gallops or  murmurs. ?Lungs: clear ?Abdomen: soft, nontender, nondistended. No hepatosplenomegaly. No bruits or masses. Good bowel sounds. ?Extremities: no cyanosis, clubbing, rash, edema ?Neuro: alert & oriented x 3, cranial nerv

## 2021-11-05 NOTE — Telephone Encounter (Signed)
Pt called CSW to request help with getting ride to appt tomorrow.  Had been utilizing [facility] [employer] but that benefit no longer available.  Informed pt that she would need to call member services for her managed medicare plan to discuss transportation benefit through them for future appts but with short notice we would provide her with taxi voucher to get to appt tomorrow. Pt confirms she has no other means to make it to appt. ? ?Voucher sent in for arrangement- pick up time set for 10:30am- pt aware. ? ?Will continue to follow and assist as needed ? ?Jorge Ny, LCSW ?Clinical Social Worker ?[facility] ?Desk#: [phone] ?Cell#: [phone] ? ?

## 2021-11-06 ENCOUNTER — Encounter (HOSPITAL_COMMUNITY): Payer: Self-pay

## 2021-11-06 ENCOUNTER — Ambulatory Visit (HOSPITAL_COMMUNITY)
Admission: RE | Admit: 2021-11-06 | Discharge: 2021-11-06 | Disposition: A | Discharge status: Home / Self Care | Payer: Medicare PPO | Source: Ambulatory Visit | Attending: Cardiology | Admitting: Cardiology

## 2021-11-06 VITALS — BP 102/64 | HR 93 | Wt 97.8 lb

## 2021-11-06 DIAGNOSIS — M069 Rheumatoid arthritis, unspecified: Secondary | ICD-10-CM | POA: Diagnosis not present

## 2021-11-06 DIAGNOSIS — I4891 Unspecified atrial fibrillation: Secondary | ICD-10-CM | POA: Diagnosis not present

## 2021-11-06 DIAGNOSIS — Z7901 Long term (current) use of anticoagulants: Secondary | ICD-10-CM | POA: Insufficient documentation

## 2021-11-06 DIAGNOSIS — Z79899 Other long term (current) drug therapy: Secondary | ICD-10-CM | POA: Diagnosis not present

## 2021-11-06 DIAGNOSIS — N184 Chronic kidney disease, stage 4 (severe): Secondary | ICD-10-CM | POA: Diagnosis not present

## 2021-11-06 DIAGNOSIS — B029 Zoster without complications: Secondary | ICD-10-CM | POA: Insufficient documentation

## 2021-11-06 DIAGNOSIS — I428 Other cardiomyopathies: Secondary | ICD-10-CM | POA: Diagnosis not present

## 2021-11-06 DIAGNOSIS — Z7952 Long term (current) use of systemic steroids: Secondary | ICD-10-CM | POA: Insufficient documentation

## 2021-11-06 DIAGNOSIS — I5022 Chronic systolic (congestive) heart failure: Secondary | ICD-10-CM | POA: Insufficient documentation

## 2021-11-06 LAB — BRAIN NATRIURETIC PEPTIDE: B Natriuretic Peptide: 190.3 pg/mL — ABNORMAL HIGH (ref 0.0–100.0)

## 2021-11-06 LAB — CBC
HCT: 38.8 % (ref 36.0–46.0)
Hemoglobin: 12.7 g/dL (ref 12.0–15.0)
MCH: 30.8 pg (ref 26.0–34.0)
MCHC: 32.7 g/dL (ref 30.0–36.0)
MCV: 94.2 fL (ref 80.0–100.0)
Platelets: 238 10*3/uL (ref 150–400)
RBC: 4.12 MIL/uL (ref 3.87–5.11)
RDW: 16.8 % — ABNORMAL HIGH (ref 11.5–15.5)
WBC: 9 10*3/uL (ref 4.0–10.5)
nRBC: 0 % (ref 0.0–0.2)

## 2021-11-06 LAB — COMPREHENSIVE METABOLIC PANEL
ALT: 48 U/L — ABNORMAL HIGH (ref 0–44)
AST: 42 U/L — ABNORMAL HIGH (ref 15–41)
Albumin: 3.5 g/dL (ref 3.5–5.0)
Alkaline Phosphatase: 73 U/L (ref 38–126)
Anion gap: 9 (ref 5–15)
BUN: 41 mg/dL — ABNORMAL HIGH (ref 8–23)
CO2: 27 mmol/L (ref 22–32)
Calcium: 9.5 mg/dL (ref 8.9–10.3)
Chloride: 103 mmol/L (ref 98–111)
Creatinine, Ser: 2.65 mg/dL — ABNORMAL HIGH (ref 0.44–1.00)
GFR, Estimated: 18 mL/min — ABNORMAL LOW (ref >60)
Glucose, Bld: 154 mg/dL — ABNORMAL HIGH (ref 70–99)
Potassium: 3.9 mmol/L (ref 3.5–5.1)
Sodium: 139 mmol/L (ref 135–145)
Total Bilirubin: 0.4 mg/dL (ref 0.3–1.2)
Total Protein: 7 g/dL (ref 6.5–8.1)

## 2021-11-06 NOTE — Patient Instructions (Signed)
It was great to see you today! ?No medication changes are needed at this time. ? ?Labs today ?We will only contact you if something comes back abnormal or we need to make some changes. ?Otherwise no news is good news! ? ? ?Your physician wants you to follow-up with your primary care provider ? ? ?Your physician wants you to follow-up in: 4 months with Dr Kendall Flack will receive a reminder letter in the mail two months in advance. If you don't receive a letter, please call our office to schedule the follow-up appointment. ? ?Do the following things EVERYDAY: ?Weigh yourself in the morning before breakfast. Write it down and keep it in a log. ?Take your medicines as prescribed ?Eat low salt foods--Limit salt (sodium) to 2000 mg per day.  ?Stay as active as you can everyday ?Limit all fluids for the day to less than 2 liters ? ?At the [facility], you and your health needs are our priority. As part of our continuing mission to provide you with exceptional heart care, we have created designated Provider Care Teams. These Care Teams include your primary Cardiologist (physician) and Advanced Practice Providers (APPs- Physician Assistants and Nurse Practitioners) who all work together to provide you with the care you need, when you need it.  ? ?You may see any of the following providers on your designated Care Team at your next follow up: ?Dr Glori Bickers ?Dr Loralie Champagne ?Darrick Grinder, NP ?Lyda Jester, PA ?Jessica Milford,NP ?Marlyce Huge, PA ?Audry Riles, PharmD ? ? ?Please be sure to bring in all your medications bottles to every appointment.  ? ?If you have any questions or concerns before your next appointment please send [place] a message through [place] or call our office at [phone].   ? ?TO LEAVE A MESSAGE FOR THE NURSE SELECT OPTION 2, PLEASE LEAVE A MESSAGE INCLUDING: ?YOUR NAME ?DATE OF BIRTH ?CALL BACK NUMBER ?REASON FOR CALL**this is important as we prioritize the call backs ? ?YOU  WILL RECEIVE A CALL BACK THE SAME DAY AS LONG AS YOU CALL BEFORE 4:00 PM ? ?

## 2021-11-09 ENCOUNTER — Telehealth: Payer: Self-pay | Admitting: Physician Assistant

## 2021-11-09 ENCOUNTER — Other Ambulatory Visit (HOSPITAL_COMMUNITY): Payer: Self-pay | Admitting: Cardiology

## 2021-11-09 NOTE — Telephone Encounter (Addendum)
? ?  The patient called the answering service after-hours today, requesting refill on Farxiga. She was recently seen in CHF clinic 3/30 at which time she complained of feeling nauseated, poor PO intake, poor appetite, weight down 7lb since last visit, and BP soft. Ellen Henri PA-C noted concern for patient being dry. Spironolactone was recently stopped for increase in Cr. This was rechecked 3/30 and was 2.65. Her trends compared to 8 months ago had run 2.08 ->1.96 ->1.78 -> 2.61 ->2.29 -> 2.65. She continues with poor appetite/poor oral intake at this time. Since Wilder Glade is generally recommended to hold for volume depletion, I will hold off on acutely refilling for today and route to CHF team to review in AM as they are familiar with patient to advise on continuing/refilling this medication tomorrow when office re-opens.  ? ?Will route to Dr. Aundra Dubin and Lyda Jester. Per schedule Brittainy may be out tomorrow so will also cc to Maricela Bo. who is in clinic tomorrow, to review with Dr. Aundra Dubin if neded, as she also previously saw the patient. (Please route response to CHF clinic staff so they can relay to patient - thank you.) ? ?The patient verbalized understanding and gratitude. ? ?Charlie Pitter, PA-C ? ?

## 2021-11-10 NOTE — Telephone Encounter (Signed)
Hold the Happy for now with increased creatinine ?

## 2021-11-10 NOTE — Telephone Encounter (Signed)
Spoke with pt she is aware.  

## 2021-11-11 ENCOUNTER — Telehealth (INDEPENDENT_AMBULATORY_CARE_PROVIDER_SITE_OTHER): Payer: Medicare PPO | Admitting: Nurse Practitioner

## 2021-11-11 DIAGNOSIS — B028 Zoster with other complications: Secondary | ICD-10-CM

## 2021-11-11 MED ORDER — VALACYCLOVIR HCL 500 MG PO TABS: 500.0000 mg | ORAL_TABLET | Freq: Three times a day (TID) | ORAL | 0 refills | Status: AC

## 2021-11-11 MED ORDER — GABAPENTIN 100 MG PO CAPS: 100.0000 mg | ORAL_CAPSULE | Freq: Three times a day (TID) | ORAL | 1 refills | Status: DC | PRN

## 2021-11-11 NOTE — Progress Notes (Signed)
? ?Virtual Visit via [place]  ? ?This visit type was conducted due to national recommendations for restrictions regarding the COVID-19 Pandemic (e.g. social distancing) in an effort to limit this patient's exposure and mitigate transmission in our community.  Due to her co-morbid illnesses, this patient is at least at moderate risk for complications without adequate follow up.  This format is felt to be most appropriate for this patient at this time.  All issues noted in this document were discussed and addressed.  A limited physical exam was performed with this format.   ? ?This visit type was conducted due to national recommendations for restrictions regarding the COVID-19 Pandemic (e.g. social distancing) in an effort to limit this patient's exposure and mitigate transmission in our community.  Patients identity confirmed using two different identifiers.  This format is felt to be most appropriate for this patient at this time.  All issues noted in this document were discussed and addressed.  No physical exam was performed (except for noted visual exam findings with Video Visits).   ? ?Date:  11/30/2021  ? ?ID:  Tracy Nelson, DOB Jan 01, 1946, MRN [id number] ? ?Patient Location:  ?Home - spoke with [name] ? ?Provider location:   ?Office ? ? ? ?Chief Complaint:  shingles ? ?History of Present Illness:   ? ?Tracy Nelson is a 76 y.o. female who presents via video conferencing for a telehealth visit today.   ? ?The patient does not have symptoms concerning for COVID-19 infection (fever, chills, cough, or new shortness of breath).  ? ?Patient presents for treatment of shingles. She was diagnosed at [facility] ED on 11/01/2021. She has a rash rash to her left shoulder and has a rash. She does have itching or burning.  Denies previous history of shingles.  ? ?She was taken off her farxiga thought to be affecting her kidneys.  ?She is to call Dr. Katy Fitch due to having double vision.   ?  ? ?Past Medical History:   ?Diagnosis Date  ? Acid reflux 07/15/2020  ? Arthritis 07/15/2020  ? CHF (congestive heart failure) ([place])   ? Colitis 07/15/2020  ? Dilated cardiomyopathy ([place]) 07/15/2020  ? HFrEF (heart failure with reduced ejection fraction) ([place]) 07/15/2020  ? HTN (hypertension) 07/15/2020  ? Osteoporosis 07/15/2020  ? Palpitations 07/15/2020  ? Rheumatoid arthritis ([place]) 07/15/2020  ? ?Past Surgical History:  ?Procedure Laterality Date  ? CARDIOVERSION N/A 11/08/2020  ? Procedure: CARDIOVERSION;  Surgeon: [name], MD;  Location: [facility] ENDOSCOPY;  Service: Cardiovascular;  Laterality: N/A;  ? RIGHT HEART CATH N/A 08/14/2020  ? Procedure: RIGHT HEART CATH;  Surgeon: [name], MD;  Location: [place] CV LAB;  Service: Cardiovascular;  Laterality: N/A;  ? TEE WITHOUT CARDIOVERSION N/A 11/08/2020  ? Procedure: TRANSESOPHAGEAL ECHOCARDIOGRAM (TEE);  Surgeon: [name], MD;  Location: [facility] ENDOSCOPY;  Service: Cardiovascular;  Laterality: N/A;  ?  ? ?Current Meds  ?Medication Sig  ? acetaminophen (TYLENOL) 500 MG tablet Take 500-1,000 mg by mouth every 6 (six) hours as needed for moderate pain.  ? amiodarone (PACERONE) 200 MG tablet Take 1 tablet (200 mg total) by mouth daily.  ? azaTHIOprine (IMURAN) 50 MG tablet Take 2 tablets (100 mg total) by mouth daily.  ? bumetanide (BUMEX) 2 MG tablet Take 2 mg by mouth 2 (two) times daily.  ? Cholecalciferol (VITAMIN D-3 PO) Take 25 mcg by mouth 3 (three) times a week.  ? cholestyramine (QUESTRAN) 4 g packet Take 1 packet (  4 g total) by mouth daily.  ? cycloSPORINE (RESTASIS) 0.05 % ophthalmic emulsion Place 1 drop into both eyes 2 (two) times daily.  ? ELIQUIS 2.5 MG TABS tablet TAKE 1 TABLET BY MOUTH TWICE A DAY  ? Ensure (ENSURE) Take 237 mLs by mouth 2 (two) times daily between meals.  ? folic acid (FOLVITE) 1 MG tablet TAKE 1 TABLET BY MOUTH EVERY DAY  ? gabapentin (NEURONTIN) 100 MG capsule Take 1 capsule (100 mg total) by mouth 3 (three) times daily as needed.  ? Menthol,  Topical Analgesic, (ICY HOT BACK EX) Apply 1 application topically daily as needed (pain).  ? MITIGARE 0.6 MG CAPS Take 0.6 mg by mouth daily as needed (gout).  ? Multiple Vitamin (MULTIVITAMIN) tablet Take 1 tablet by mouth daily.  ? omeprazole (PRILOSEC) 40 MG capsule Take 40 mg by mouth daily.  ? ondansetron (ZOFRAN) 4 MG tablet Take 1 tablet (4 mg total) by mouth every 6 (six) hours.  ? polyethylene glycol (MIRALAX / GLYCOLAX) 17 g packet Take 17 g by mouth as needed.  ? predniSONE (DELTASONE) 5 MG tablet TAKE 1 TABLET BY MOUTH EVERY DAY WITH BREAKFAST  ? traMADol (ULTRAM) 50 MG tablet Take 1 tablet (50 mg total) by mouth every 12 (twelve) hours as needed.  ? [EXPIRED] valACYclovir (VALTREX) 500 MG tablet Take 1 tablet (500 mg total) by mouth 3 (three) times daily for 7 days.  ? Vericiguat (VERQUVO) 10 MG TABS Take 10 mg by mouth daily.  ?  ? ?Allergies:   Baclofen, Penicillin g, and Penicillins  ? ?Social History  ? ?Tobacco Use  ? Smoking status: Never  ? Smokeless tobacco: Never  ?Vaping Use  ? Vaping Use: Never used  ?Substance Use Topics  ? Alcohol use: Never  ? Drug use: Never  ?  ? ?Family Hx: ?The patient's family history includes Arthritis in her maternal grandmother; Breast cancer in her maternal aunt and paternal aunt; Cancer in her brother; Diabetes in her father and mother; Hypertension in her father and mother; Lung disease in her paternal grandfather. ? ?ROS:   ?Please see the history of present illness.    ?Review of Systems  ?Skin:  Positive for rash.  ?Neurological:  Positive for headaches.   ?All other systems reviewed and are negative. ? ? ?Labs/Other Tests and Data Reviewed:   ? ?Recent Labs: ?09/03/2021: TSH 1.709 ?11/06/2021: ALT 48; B Natriuretic Peptide 190.3; BUN 41; Creatinine, Ser 2.65; Hemoglobin 12.7; Platelets 238; Potassium 3.9; Sodium 139  ? ?Recent Lipid Panel ?Lab Results  ?Component Value Date/Time  ? CHOL 209 (H) 09/04/2021 12:42 PM  ? TRIG 104 09/04/2021 12:42 PM  ? HDL 98  09/04/2021 12:42 PM  ? CHOLHDL 2.1 09/04/2021 12:42 PM  ? [place] 93 09/04/2021 12:42 PM  ? ? ?Wt Readings from Last 3 Encounters:  ?11/06/21 97 lb 12.8 oz (44.4 kg)  ?11/01/21 98 lb (44.5 kg)  ?09/23/21 105 lb (47.6 kg)  ?  ? ?Exam:   ? ?Vital Signs:  There were no vitals taken for this visit.  ? ? ?Physical Exam ?Vitals reviewed.  ?Constitutional:   ?   General: She is not in acute distress. ?   Appearance: Normal appearance.  ?Pulmonary:  ?   Effort: Pulmonary effort is normal. No respiratory distress.  ?Skin: ?   Findings: Rash (scattered rash to left side of shoulder and neck area, slightly raised) present.  ?Neurological:  ?   General: No focal deficit present.  ?  Mental Status: She is alert and oriented to person, place, and time. Mental status is at baseline.  ?   Cranial Nerves: No cranial nerve deficit.  ?Psychiatric:     ?   Mood and Affect: Mood and affect normal.     ?   Behavior: Behavior normal.     ?   Thought Content: Thought content normal.     ?   Cognition and Memory: Memory normal.     ?   Judgment: Judgment normal.  ? ? ?ASSESSMENT & PLAN:   ? ? 1. Herpes zoster with complication ?Left side of shoulder Will send another prescription for valcyvlovir, she was already treated at urgent care. I will also treat with gabapentin for the pain  ?  ? ?COVID-19 Education: ?The signs and symptoms of COVID-19 were discussed with the patient and how to seek care for testing (follow up with PCP or arrange E-visit).  The importance of social distancing was discussed today. ? ?Patient Risk:   ?After full review of this patients clinical status, I feel that they are at least moderate risk at this time. ? ?Time:   ?Today, I have spent 18 minutes/ seconds with the patient with telehealth technology discussing above diagnoses.   ? ? ?Medication Adjustments/Labs and Tests Ordered: ?Current medicines are reviewed at length with the patient today.  Concerns regarding medicines are outlined above.  ? ?Tests  Ordered: ?No orders of the defined types were placed in this encounter. ? ? ?Medication Changes: ?Meds ordered this encounter  ?Medications  ? gabapentin (NEURONTIN) 100 MG capsule  ?  Sig: Take 1 capsule (100 mg total

## 2021-11-11 NOTE — Patient Instructions (Addendum)

## 2021-11-20 ENCOUNTER — Other Ambulatory Visit: Payer: Self-pay | Admitting: Nurse Practitioner

## 2021-11-20 MED ORDER — PREDNISONE 10 MG (21) PO TBPK: ORAL_TABLET | ORAL | 0 refills | Status: DC

## 2021-11-30 ENCOUNTER — Encounter: Payer: Self-pay | Admitting: Nurse Practitioner

## 2021-11-30 NOTE — Progress Notes (Deleted)
Office Visit Note  Patient: Tracy Nelson             Date of Birth: 1946-01-21           MRN: [id number]             PCP: Merryl Hacker, No Referring: [name], [name] Visit Date: 12/01/2021   Subjective:  No chief complaint on file.   History of Present Illness: Tracy Nelson is a 76 y.o. female here for follow up for seornegative RA with recent cornel ulcerations on AZA 100 mg daily and prednisone 5 mg daily.***   Previous HPI 09/23/21 Tracy Nelson is a 76 y.o. female here for follow up for seronegative RA complicated with corneal ulcerations we increased azathioprine dose to 75 mg daily and increased prednisone temporarily. I discussed plan with Dr. Patrice Paradise also planning to discontinue hydroxychloroquine over concern about risk and ability to monitor safety. She continues to have eye inflammation worse on the left side. She notices some eye pain and headaches associated with blurry vision. Often around using eye drops or first thing in the morning. She had lab work last month for Dr. Benjamine Mola with normal blood counts renal function appeared slightly worse.   Previous HPI 01/09/21 Tracy Nelson is a 76 y.o. female here for seronegative rheumatoid arthritis previously a patient with Dr. Francee Nodal in [place], [place]. She currently taken hydroxychloroquine 200 mg PO daily and prednisone 5 mg PO daily and has not experienced a severe flare or increase in steroid dosing since about a year ago.  She also experiences sicca symptoms currently treated with Restasis.  She was previously taking Imuran but is not clear if she is still taking this and how much is contributory to disease control. 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  She has also previously undergone some fibromyalgia tender point injection with significant benefit of back pain symptoms. She has history of gastroesophageal  reflux with anterior esophageal web formation. She has upcoming nephrology appointment next week for her CKD and due to frequent hard sticks prefers if lab draws may be consolidated with frequent bruising and discomfort associated.   No Rheumatology ROS completed.   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   Monoclonal gammopathy 03/10/2021   Hypotension 10/22/2020   Atypical chest pain 10/22/2020   CKD (chronic kidney disease), stage III ([place]) 10/22/2020   Nausea & vomiting 10/22/2020   Physical deconditioning 10/22/2020   Atrial flutter ([place]) 10/18/2020   Arthritis 07/15/2020   GERD (gastroesophageal reflux disease) 07/15/2020   Colitis 07/15/2020   Non-ischemic cardiomyopathy ([place]) 07/15/2020   HFrEF (heart failure with reduced ejection fraction) ([place]) 07/15/2020   HTN (hypertension) 07/15/2020   Osteoporosis 07/15/2020   Palpitations 07/15/2020   Acute gout of left shoulder 07/10/2020   Rheumatoid arthritis involving multiple sites with positive rheumatoid factor ([place]) 10/28/2015   Mitral regurgitation 05/30/2014   Acute on chronic combined systolic (congestive) and diastolic (congestive) heart failure ([place]) 05/19/2014   Vitamin D deficiency 05/15/2013   Encounter for long-term (current) use of other medications 12/14/2008   Sjogren's syndrome ([place]) 12/14/2008    Past Medical History:  Diagnosis Date   Acid reflux 07/15/2020   Arthritis 07/15/2020   CHF (congestive heart failure) ([place])    Colitis 07/15/2020   Dilated cardiomyopathy ([place]) 07/15/2020   HFrEF (heart failure with reduced ejection  fraction) ([place]) 07/15/2020   HTN (hypertension) 07/15/2020   Osteoporosis 07/15/2020   Palpitations 07/15/2020   Rheumatoid arthritis ([place]) 07/15/2020    Family History  Problem Relation Age of Onset   Hypertension Mother    Diabetes Mother    Hypertension Father    Diabetes Father    Breast cancer Maternal Aunt     Breast cancer Paternal Aunt    Arthritis Maternal Grandmother    Lung disease Paternal Grandfather    Cancer Brother    Past Surgical History:  Procedure Laterality Date   CARDIOVERSION N/A 11/08/2020   Procedure: CARDIOVERSION;  Surgeon: [name], MD;  Location: [place];  Service: Cardiovascular;  Laterality: N/A;   RIGHT HEART CATH N/A 08/14/2020   Procedure: RIGHT HEART CATH;  Surgeon: [name], MD;  Location: [place] CV LAB;  Service: Cardiovascular;  Laterality: N/A;   TEE WITHOUT CARDIOVERSION N/A 11/08/2020   Procedure: TRANSESOPHAGEAL ECHOCARDIOGRAM (TEE);  Surgeon: [name], MD;  Location: [facility] ENDOSCOPY;  Service: Cardiovascular;  Laterality: N/A;   Social History   Social History Narrative   Not on file    There is no immunization history on file for this patient.   Objective: Vital Signs: There were no vitals taken for this visit.   Physical Exam   Musculoskeletal Exam: ***  CDAI Exam: CDAI Score: -- Patient Global: --; Provider Global: -- Swollen: --; Tender: -- Joint Exam 12/01/2021   No joint exam has been documented for this visit   There is currently no information documented on the homunculus. Go to the Rheumatology activity and complete the homunculus joint exam.  Investigation: No additional findings.  Imaging: CT Head Wo Contrast  Result Date: 11/01/2021 CLINICAL DATA:  Headache and chest pain. EXAM: CT HEAD WITHOUT CONTRAST TECHNIQUE: Contiguous axial images were obtained from the base of the skull through the vertex without intravenous contrast. RADIATION DOSE REDUCTION: This exam was performed according to the departmental dose-optimization program which includes automated exposure control, adjustment of the mA and/or kV according to patient size and/or use of iterative reconstruction technique. COMPARISON:  None. FINDINGS: Brain: There is mild cerebral atrophy with widening of the extra-axial spaces and ventricular  dilatation. There are areas of decreased attenuation within the white matter tracts of the supratentorial brain, consistent with microvascular disease changes. Vascular: No hyperdense vessel or unexpected calcification. Skull: Normal. Negative for fracture or focal lesion. Sinuses/Orbits: No acute finding. Other: None. IMPRESSION: No acute intracranial abnormality. Electronically Signed   By: Virgina Norfolk M.D.   On: 11/01/2021 21:27   DG Chest Port 1 View  Result Date: 11/01/2021 CLINICAL DATA:  Headache and chest pain. EXAM: PORTABLE CHEST 1 VIEW COMPARISON:  October 18, 2020 FINDINGS: Multiple overlying radiopaque cardiac lead wires are seen. The cardiac silhouette is borderline in size. Both lungs are clear. There is moderate to marked severity dextroscoliosis of the thoracic spine. IMPRESSION: No active cardiopulmonary disease. Electronically Signed   By: Virgina Norfolk M.D.   On: 11/01/2021 21:29    Recent Labs: Lab Results  Component Value Date   WBC 9.0 11/06/2021   HGB 12.7 11/06/2021   PLT 238 11/06/2021   NA 139 11/06/2021   K 3.9 11/06/2021   CL 103 11/06/2021   CO2 27 11/06/2021   GLUCOSE 154 (H) 11/06/2021   BUN 41 (H) 11/06/2021   CREATININE 2.65 (H) 11/06/2021   BILITOT 0.4 11/06/2021   ALKPHOS 73 11/06/2021   AST 42 (H) 11/06/2021  ALT 48 (H) 11/06/2021   PROT 7.0 11/06/2021   ALBUMIN 3.5 11/06/2021   CALCIUM 9.5 11/06/2021    Speciality Comments: No specialty comments available.  Procedures:  No procedures performed Allergies: Baclofen, Penicillin g, and Penicillins   Assessment / Plan:     Visit Diagnoses: No diagnosis found.  ***  Orders: No orders of the defined types were placed in this encounter.  No orders of the defined types were placed in this encounter.    Follow-Up Instructions: No follow-ups on file.   [name], MD  Note - This record has been created using [employer].  Chart creation errors have been sought, but  may not always  have been located. Such creation errors do not reflect on  the standard of medical care. c

## 2021-12-01 ENCOUNTER — Ambulatory Visit: Payer: Medicare PPO | Admitting: Internal Medicine

## 2021-12-04 ENCOUNTER — Ambulatory Visit (INDEPENDENT_AMBULATORY_CARE_PROVIDER_SITE_OTHER): Payer: Medicare PPO | Admitting: Nurse Practitioner

## 2021-12-04 ENCOUNTER — Encounter: Payer: Self-pay | Admitting: Nurse Practitioner

## 2021-12-04 VITALS — BP 128/72 | HR 67 | Temp 98.7°F | Ht 62.0 in | Wt 102.0 lb

## 2021-12-04 DIAGNOSIS — B028 Zoster with other complications: Secondary | ICD-10-CM | POA: Diagnosis not present

## 2021-12-04 MED ORDER — GABAPENTIN 100 MG PO CAPS: ORAL_CAPSULE | ORAL | 1 refills | Status: DC

## 2021-12-04 NOTE — Progress Notes (Signed)
I,[name],acting as a Education administrator for [employer], [name].,have documented all relevant documentation on the behalf of [name], [name],as directed by  [name], [name] while in the presence of [name], [place].  This visit occurred during the SARS-CoV-2 public health emergency.  Safety protocols were in place, including screening questions prior to the visit, additional usage of staff PPE, and extensive cleaning of exam room while observing appropriate contact time as indicated for disinfecting solutions.  Subjective:     [name] , female    DOB: 04/05/1946 , 76 y.o.   MRN: [id number]   Chief Complaint  Patient presents with   Herpes Zoster    HPI  Patient presents today for treatment for shingle pain. She reports her shoulder is jumping. She is also on gabapentin 100 mg daily     Past Medical History:  Diagnosis Date   Acid reflux 07/15/2020   Arthritis 07/15/2020   CHF (congestive heart failure) ([place])    Colitis 07/15/2020   Dilated cardiomyopathy ([place]) 07/15/2020   HFrEF (heart failure with reduced ejection fraction) ([place]) 07/15/2020   HTN (hypertension) 07/15/2020   Osteoporosis 07/15/2020   Palpitations 07/15/2020   Rheumatoid arthritis ([place]) 07/15/2020     Family History  Problem Relation Age of Onset   Hypertension Mother    Diabetes Mother    Hypertension Father    Diabetes Father    Breast cancer Maternal Aunt    Breast cancer Paternal Aunt    Arthritis Maternal Grandmother    Lung disease Paternal Grandfather    Cancer Brother      Current Outpatient Medications:    acetaminophen (TYLENOL) 500 MG tablet, Take 500-1,000 mg by mouth every 6 (six) hours as needed for moderate pain., Disp: , Rfl:    amiodarone (PACERONE) 200 MG tablet, TAKE 1 TABLET BY MOUTH EVERY DAY, Disp: 60 tablet, Rfl: 6   azaTHIOprine (IMURAN) 50 MG tablet, TAKE 2 TABLETS BY MOUTH EVERY DAY, Disp: 60 tablet, Rfl: 1   bumetanide (BUMEX) 2 MG tablet, Take 2 mg by mouth 2  (two) times daily., Disp: , Rfl:    Cholecalciferol (VITAMIN D-3 PO), Take 25 mcg by mouth 3 (three) times a week., Disp: , Rfl:    cholestyramine (QUESTRAN) 4 g packet, Take 1 packet (4 g total) by mouth daily., Disp: 60 each, Rfl: 12   cyclobenzaprine (FLEXERIL) 10 MG tablet, Take 1 tablet (10 mg total) by mouth 3 (three) times daily as needed for muscle spasms., Disp: 30 tablet, Rfl: 0   cycloSPORINE (RESTASIS) 0.05 % ophthalmic emulsion, Place 1 drop into both eyes 2 (two) times daily., Disp: 1.5 mL, Rfl: 2   ELIQUIS 2.5 MG TABS tablet, TAKE 1 TABLET BY MOUTH TWICE A DAY, Disp: 60 tablet, Rfl: 5   Ensure (ENSURE), Take 237 mLs by mouth 2 (two) times daily between meals., Disp: , Rfl:    folic acid (FOLVITE) 1 MG tablet, TAKE 1 TABLET BY MOUTH EVERY DAY, Disp: 30 tablet, Rfl: 11   gabapentin (NEURONTIN) 100 MG capsule, Take 1 capsule at breakfast and lunch and 2 capsules at bedtime, Disp: 90 capsule, Rfl: 1   hydroxychloroquine (PLAQUENIL) 200 MG tablet, TAKE 1 TABLET BY MOUTH EVERY DAY, Disp: 30 tablet, Rfl: 5   Menthol, Topical Analgesic, (ICY HOT BACK EX), Apply 1 application topically daily as needed (pain)., Disp: , Rfl:    MITIGARE 0.6 MG CAPS, Take 0.6 mg by mouth daily as needed (gout)., Disp: , Rfl:  Multiple Vitamin (MULTIVITAMIN) tablet, Take 1 tablet by mouth daily., Disp: , Rfl:    omeprazole (PRILOSEC) 40 MG capsule, Take 40 mg by mouth daily., Disp: , Rfl:    ondansetron (ZOFRAN) 4 MG tablet, Take 1 tablet (4 mg total) by mouth every 6 (six) hours., Disp: 12 tablet, Rfl: 0   polyethylene glycol (MIRALAX / GLYCOLAX) 17 g packet, Take 17 g by mouth as needed., Disp: , Rfl:    predniSONE (DELTASONE) 5 MG tablet, TAKE 1 TABLET BY MOUTH EVERY DAY WITH BREAKFAST, Disp: 30 tablet, Rfl: 1   predniSONE (STERAPRED UNI-PAK 21 TAB) 10 MG (21) TBPK tablet, TAKE AS DIRECTED, Disp: 21 each, Rfl: 1   traMADol (ULTRAM) 50 MG tablet, Take 1 tablet (50 mg total) by mouth every 12 (twelve) hours as  needed., Disp: 6 tablet, Rfl: 0   Vericiguat (VERQUVO) 10 MG TABS, Take 10 mg by mouth daily., Disp: 90 tablet, Rfl: 3   Allergies  Allergen Reactions   Baclofen Nausea And Vomiting   Penicillin G     Other reaction(s): Unknown   Penicillins Rash     Review of Systems  Constitutional: Negative.   Respiratory: Negative.    Cardiovascular: Negative.   Gastrointestinal: Negative.   Musculoskeletal:        Left shoulder "jumping"  Neurological: Negative.     Today's Vitals   12/04/21 1542  BP: 128/72  Pulse: 67  Temp: 98.7 F (37.1 C)  TempSrc: Oral  Weight: 102 lb (46.3 kg)  Height: '5\' 2"'$  (1.575 m)  PainSc: 10-Worst pain ever   Body mass index is 18.66 kg/m.  Wt Readings from Last 3 Encounters:  12/04/21 102 lb (46.3 kg)  11/06/21 97 lb 12.8 oz (44.4 kg)  11/01/21 98 lb (44.5 kg)    Objective:  Physical Exam Vitals reviewed.  Constitutional:      General: She is not in acute distress.    Appearance: Normal appearance. She is well-developed.  HENT:     Head: Normocephalic and atraumatic.  Eyes:     Pupils: Pupils are equal, round, and reactive to light.  Cardiovascular:     Rate and Rhythm: Normal rate and regular rhythm.     Pulses: Normal pulses.     Heart sounds: Normal heart sounds. No murmur heard. Pulmonary:     Effort: Pulmonary effort is normal.     Breath sounds: Normal breath sounds.  Musculoskeletal:        General: No tenderness. Normal range of motion.     Comments: There is some mild twitching to her left shoulder during visit .  Skin:    General: Skin is warm and dry.     Capillary Refill: Capillary refill takes less than 2 seconds.     Findings: Rash (Healing scarring rash to left shoulder) present.  Neurological:     General: No focal deficit present.     Mental Status: She is alert and oriented to person, place, and time.     Cranial Nerves: No cranial nerve deficit.     Motor: No weakness.  Psychiatric:        Mood and Affect: Mood  normal.        Behavior: Behavior normal.        Thought Content: Thought content normal.        Judgment: Judgment normal.        Assessment And Plan:     1. Herpes zoster with complication Comments: She is to call Dr.  Groat to be evaluated since she had blurred vision initially. Explained disease process of Shingles. Will see if increased gabapentin will help - gabapentin (NEURONTIN) 100 MG capsule; Take 1 capsule at breakfast and lunch and 2 capsules at bedtime  Dispense: 90 capsule; Refill: 1     Patient was given opportunity to ask questions. Patient verbalized understanding of the plan and was able to repeat key elements of the plan. All questions were answered to their satisfaction.  [name], [name]   I, [name], [name], have reviewed all documentation for this visit. The documentation on 12/04/21 for the exam, diagnosis, procedures, and orders are all accurate and complete.   IF YOU HAVE BEEN REFERRED TO A SPECIALIST, IT MAY TAKE 1-2 WEEKS TO SCHEDULE/PROCESS THE REFERRAL. IF YOU HAVE NOT HEARD FROM US/SPECIALIST IN TWO WEEKS, PLEASE GIVE [place] A CALL AT [phone].   THE PATIENT IS ENCOURAGED TO PRACTICE SOCIAL DISTANCING DUE TO THE COVID-19 PANDEMIC.

## 2021-12-04 NOTE — Patient Instructions (Signed)

## 2021-12-07 ENCOUNTER — Other Ambulatory Visit: Payer: Self-pay | Admitting: Internal Medicine

## 2021-12-07 DIAGNOSIS — H16003 Unspecified corneal ulcer, bilateral: Secondary | ICD-10-CM

## 2021-12-07 DIAGNOSIS — M0579 Rheumatoid arthritis with rheumatoid factor of multiple sites without organ or systems involvement: Secondary | ICD-10-CM

## 2021-12-08 ENCOUNTER — Other Ambulatory Visit (HOSPITAL_COMMUNITY): Payer: Self-pay | Admitting: Cardiology

## 2021-12-19 ENCOUNTER — Other Ambulatory Visit (HOSPITAL_COMMUNITY): Payer: Self-pay | Admitting: Cardiology

## 2021-12-22 ENCOUNTER — Other Ambulatory Visit (HOSPITAL_COMMUNITY): Payer: Self-pay | Admitting: Cardiology

## 2021-12-22 ENCOUNTER — Other Ambulatory Visit: Payer: Self-pay | Admitting: Nurse Practitioner

## 2021-12-22 DIAGNOSIS — M0579 Rheumatoid arthritis with rheumatoid factor of multiple sites without organ or systems involvement: Secondary | ICD-10-CM

## 2021-12-22 DIAGNOSIS — H16003 Unspecified corneal ulcer, bilateral: Secondary | ICD-10-CM

## 2021-12-24 ENCOUNTER — Ambulatory Visit: Payer: Medicare PPO | Admitting: Gastroenterology

## 2021-12-24 ENCOUNTER — Other Ambulatory Visit (HOSPITAL_COMMUNITY): Payer: Self-pay | Admitting: Cardiology

## 2021-12-29 ENCOUNTER — Other Ambulatory Visit: Payer: Self-pay | Admitting: Nurse Practitioner

## 2021-12-29 DIAGNOSIS — M62838 Other muscle spasm: Secondary | ICD-10-CM

## 2021-12-29 MED ORDER — CYCLOBENZAPRINE HCL 10 MG PO TABS: 10.0000 mg | ORAL_TABLET | Freq: Three times a day (TID) | ORAL | 0 refills | Status: DC | PRN

## 2021-12-31 ENCOUNTER — Other Ambulatory Visit (HOSPITAL_COMMUNITY): Payer: Self-pay | Admitting: Cardiology

## 2022-01-20 ENCOUNTER — Other Ambulatory Visit (HOSPITAL_COMMUNITY): Payer: Self-pay | Admitting: Cardiology

## 2022-01-31 IMAGING — DX DG ABD PORTABLE 1V
1 series · 1 of 1 positions shown · non-contrast
Comparison: None.

CLINICAL DATA: Abdomen pain with constipation

EXAM:
PORTABLE ABDOMEN - 1 VIEW

[abdomen kub]
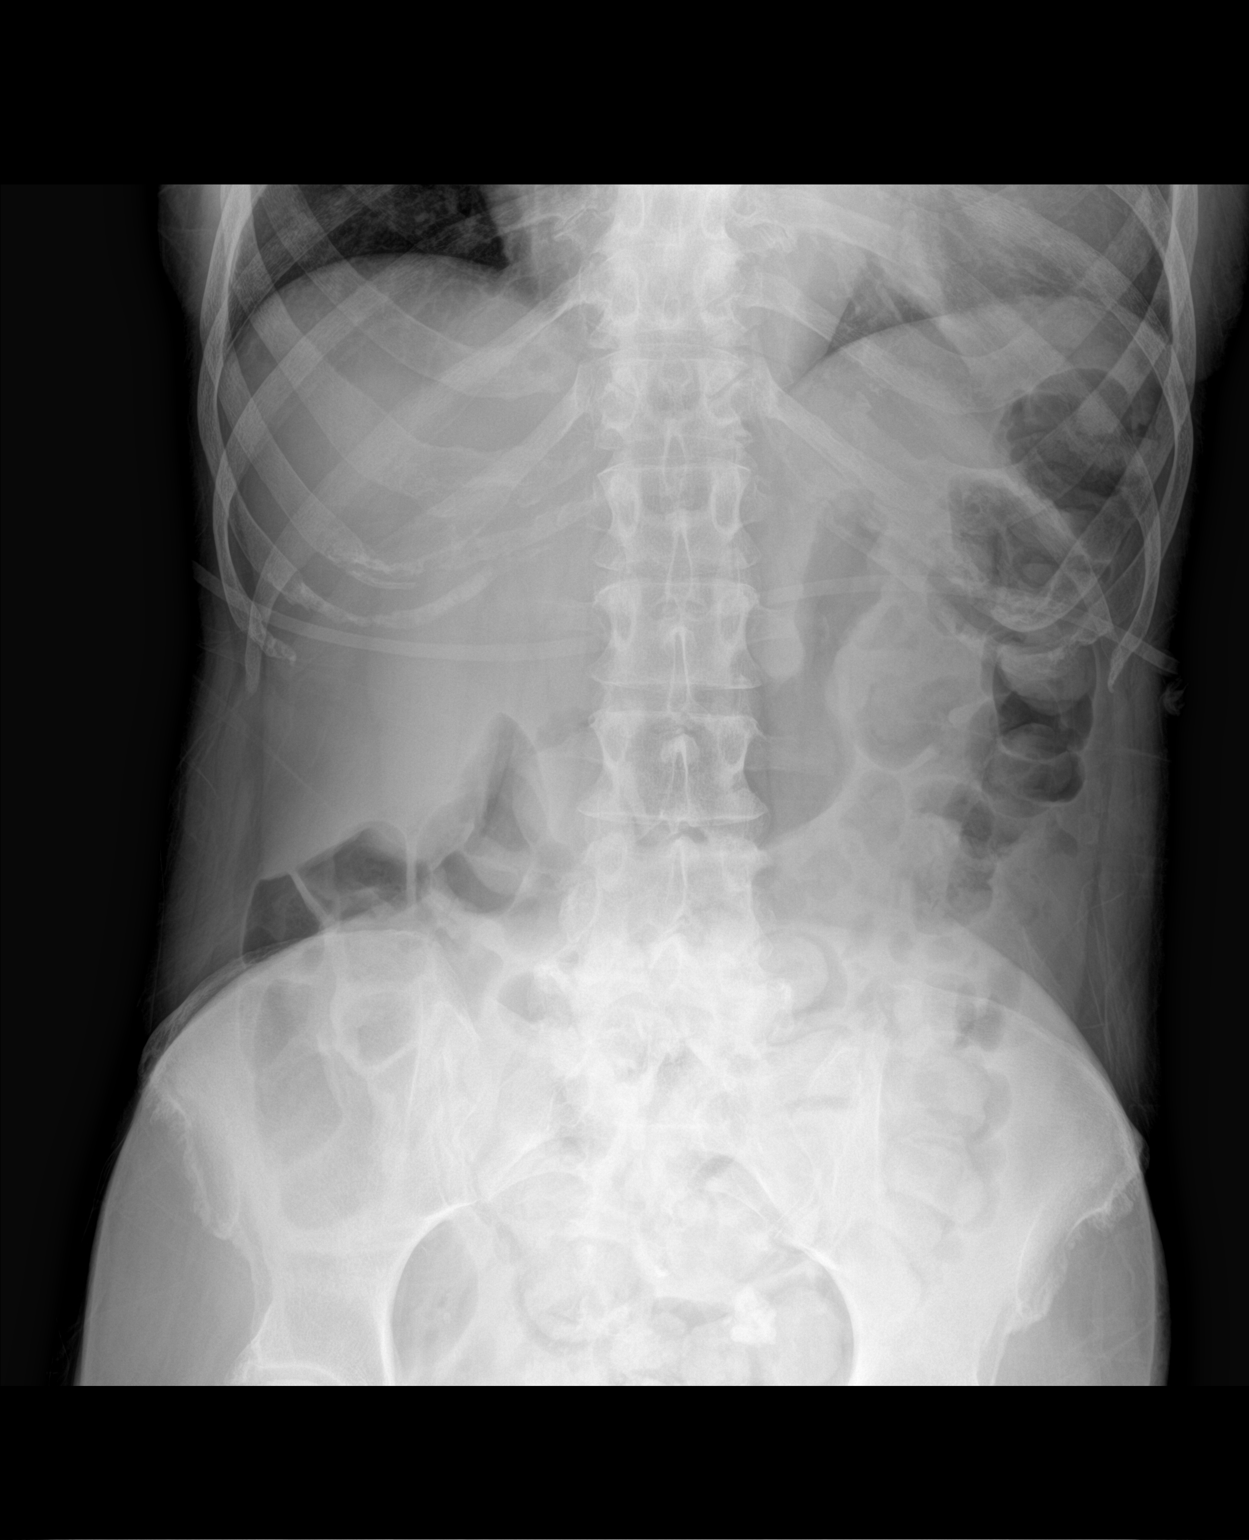

[1 of 1 positions shown; findings below may reference images not displayed]

FINDINGS: Cardiomegaly. Atelectasis at the left base. Nonobstructed gas
pattern with moderate stool in the colon. Possible calcified
fibroids in the left pelvis.
IMPRESSION: Nonobstructed gas pattern with moderate stool in the colon.

## 2022-02-04 ENCOUNTER — Other Ambulatory Visit: Payer: Self-pay | Admitting: Internal Medicine

## 2022-02-04 ENCOUNTER — Ambulatory Visit (HOSPITAL_COMMUNITY)
Admission: RE | Admit: 2022-02-04 | Discharge: 2022-02-04 | Disposition: A | Discharge status: Home / Self Care | Payer: Medicare PPO | Source: Ambulatory Visit | Attending: Cardiology | Admitting: Cardiology

## 2022-02-04 ENCOUNTER — Encounter (HOSPITAL_COMMUNITY): Payer: Self-pay | Admitting: Cardiology

## 2022-02-04 VITALS — BP 80/50 | HR 86 | Wt 97.8 lb

## 2022-02-04 DIAGNOSIS — D472 Monoclonal gammopathy: Secondary | ICD-10-CM | POA: Insufficient documentation

## 2022-02-04 DIAGNOSIS — I428 Other cardiomyopathies: Secondary | ICD-10-CM | POA: Diagnosis present

## 2022-02-04 DIAGNOSIS — I959 Hypotension, unspecified: Secondary | ICD-10-CM | POA: Diagnosis not present

## 2022-02-04 DIAGNOSIS — Z7952 Long term (current) use of systemic steroids: Secondary | ICD-10-CM | POA: Diagnosis not present

## 2022-02-04 DIAGNOSIS — Z7901 Long term (current) use of anticoagulants: Secondary | ICD-10-CM | POA: Insufficient documentation

## 2022-02-04 DIAGNOSIS — R7989 Other specified abnormal findings of blood chemistry: Secondary | ICD-10-CM | POA: Diagnosis not present

## 2022-02-04 DIAGNOSIS — M0579 Rheumatoid arthritis with rheumatoid factor of multiple sites without organ or systems involvement: Secondary | ICD-10-CM

## 2022-02-04 DIAGNOSIS — I4891 Unspecified atrial fibrillation: Secondary | ICD-10-CM | POA: Insufficient documentation

## 2022-02-04 DIAGNOSIS — M069 Rheumatoid arthritis, unspecified: Secondary | ICD-10-CM | POA: Insufficient documentation

## 2022-02-04 DIAGNOSIS — N184 Chronic kidney disease, stage 4 (severe): Secondary | ICD-10-CM | POA: Diagnosis not present

## 2022-02-04 DIAGNOSIS — Z79899 Other long term (current) drug therapy: Secondary | ICD-10-CM | POA: Insufficient documentation

## 2022-02-04 DIAGNOSIS — I5022 Chronic systolic (congestive) heart failure: Secondary | ICD-10-CM | POA: Insufficient documentation

## 2022-02-04 DIAGNOSIS — I43 Cardiomyopathy in diseases classified elsewhere: Secondary | ICD-10-CM | POA: Diagnosis not present

## 2022-02-04 DIAGNOSIS — R5383 Other fatigue: Secondary | ICD-10-CM | POA: Insufficient documentation

## 2022-02-04 DIAGNOSIS — H16003 Unspecified corneal ulcer, bilateral: Secondary | ICD-10-CM

## 2022-02-04 LAB — COMPREHENSIVE METABOLIC PANEL
ALT: 25 U/L (ref 0–44)
AST: 28 U/L (ref 15–41)
Albumin: 3.3 g/dL — ABNORMAL LOW (ref 3.5–5.0)
Alkaline Phosphatase: 48 U/L (ref 38–126)
Anion gap: 10 (ref 5–15)
BUN: 58 mg/dL — ABNORMAL HIGH (ref 8–23)
CO2: 22 mmol/L (ref 22–32)
Calcium: 9.3 mg/dL (ref 8.9–10.3)
Chloride: 108 mmol/L (ref 98–111)
Creatinine, Ser: 3.82 mg/dL — ABNORMAL HIGH (ref 0.44–1.00)
GFR, Estimated: 12 mL/min — ABNORMAL LOW (ref >60)
Glucose, Bld: 100 mg/dL — ABNORMAL HIGH (ref 70–99)
Potassium: 4.1 mmol/L (ref 3.5–5.1)
Sodium: 140 mmol/L (ref 135–145)
Total Bilirubin: 0.5 mg/dL (ref 0.3–1.2)
Total Protein: 6 g/dL — ABNORMAL LOW (ref 6.5–8.1)

## 2022-02-04 LAB — CBC
HCT: 29.8 % — ABNORMAL LOW (ref 36.0–46.0)
Hemoglobin: 9.4 g/dL — ABNORMAL LOW (ref 12.0–15.0)
MCH: 30.4 pg (ref 26.0–34.0)
MCHC: 31.5 g/dL (ref 30.0–36.0)
MCV: 96.4 fL (ref 80.0–100.0)
Platelets: 253 10*3/uL (ref 150–400)
RBC: 3.09 MIL/uL — ABNORMAL LOW (ref 3.87–5.11)
RDW: 16 % — ABNORMAL HIGH (ref 11.5–15.5)
WBC: 8.1 10*3/uL (ref 4.0–10.5)
nRBC: 0 % (ref 0.0–0.2)

## 2022-02-04 LAB — TSH: TSH: 1.585 u[IU]/mL (ref 0.350–4.500)

## 2022-02-04 MED ORDER — MIDODRINE HCL 5 MG PO TABS: 5.0000 mg | ORAL_TABLET | Freq: Three times a day (TID) | ORAL | 3 refills | Status: DC

## 2022-02-04 NOTE — Patient Instructions (Signed)
Start Midodrine '5mg'$  Three times a day  Labs done today, your results will be available in MyChart, we will contact you for abnormal readings.  Your physician recommends that you schedule a follow-up appointment in: 4 weeks.  If you have any questions or concerns before your next appointment please send [place] a message through [place] or call our office at [phone].    TO LEAVE A MESSAGE FOR THE NURSE SELECT OPTION 2, PLEASE LEAVE A MESSAGE INCLUDING: YOUR NAME DATE OF BIRTH CALL BACK NUMBER REASON FOR CALL**this is important as we prioritize the call backs  YOU WILL RECEIVE A CALL BACK THE SAME DAY AS LONG AS YOU CALL BEFORE 4:00 PM  At the [facility], you and your health needs are our priority. As part of our continuing mission to provide you with exceptional heart care, we have created designated Provider Care Teams. These Care Teams include your primary Cardiologist (physician) and Advanced Practice Providers (APPs- Physician Assistants and Nurse Practitioners) who all work together to provide you with the care you need, when you need it.   You may see any of the following providers on your designated Care Team at your next follow up: Dr Glori Bickers Dr Haynes Kerns, NP Lyda Jester, [place] [facility] [place], [place] Audry Riles, PharmD   Please be sure to bring in all your medications bottles to every appointment.

## 2022-02-05 NOTE — Telephone Encounter (Signed)
Called patient to schedule follow-up appointment.  Patient states she is unable to reschedule at this time because she doesn't have her calendar.  Patient will call back.

## 2022-02-05 NOTE — Telephone Encounter (Signed)
Next Visit: Message sent to front desk to schedule appt Return in about 4 weeks (around 10/21/2021) for RA/corneal melt AZA titration/HCQ stop f/u 4wks.  Last Visit: 09/23/2021  Last Fill: 12/07/2021 Imuran, 11/06/2021 prednisone  DX:  Rheumatoid arthritis involving multiple sites with positive rheumatoid factor   Current Dose per office note 09/23/2021: azaTHIOprine (IMURAN) 50 MG tablet, predniSONE (DELTASONE) 5 MG tablet,  azathioprine to 100 mg daily which is about 2 mg/kg daily  Labs: 02/04/2022, RBC 3.09, Hemoglobin 9.4, HCT 29.8, RDW 16.0, Glucose 100, BUN 58, Creatinine 3.82, Total Protein 6.0, Albumin 3.3, GFR 12  Okay to refill Imuran and prednisone?

## 2022-02-05 NOTE — Progress Notes (Signed)
Advanced Heart Failure Clinic Progress Note   PCP: [name], [name] Primary Cardiologist: Dr. Aundra Dubin  76 y.o. with history of rheumatoid arthritis, CKD stage 3, and nonischemic cardiomyopathy was referred by Dr. Andrey Cota in [place] to establish heart failure care in [place].  Patient has been known to have a cardiomyopathy since 2015.  Cardiac MRI in 2015 showed EF 37% with no LGE.  Coronary angiography at that time showed no significant coronary disease.  Over the next few years, LV EF fluctuated up and down.  In 3/21, echo showed EF down to 20-25%.  She was admitted to the hospital in [place], [place] in 11/21 with CHF exacerbation.  Echo showed EF 20-25%, global hypokinesis, mildly decreased RV systolic function.  Creatinine was noted to be elevated. She was diuresed and sent home, but was readmitted later in 11/21 with ongoing volume overload. Creatinine was up to 2.89.  She was taken off most of her cardiac meds due to soft BP and elevated creatinine. She was discharged to her daughter's house in [place].   I repeated an echo on her in 12/21, EF < 20% with moderate LV dilation and mildly decreased RV systolic function. RHC was done, surprisingly showing low filling pressures and relatively preserved cardiac index at 2.22.   She was admitted in 3/22 with atrial fibrillation, converted to NSR on amiodarone. She went into atrial flutter after this and had TEE-DCCV in 4/22.  TEE showed EF < 20%, moderate LV dilation, mild RV dilation with moderate RV dysfunction, moderate central MR.  CPX in 3/22 showed severe HF limitation.  PYP scan 4/22 was grade 1 with H/CL 1.48, equivocal.  PYP scan repeated in 3/23 and likely negative.   She was evaluated for [facility] or ANTHEM, BNP too high and thought to be too frail.   Cardiac MRI in 10/22 showed mild LV dilation with EF 23%, normal RV size with RVEF 37%, ECV 34%, small area of subendocardial LGE in the mid inferolateral wall and small area of  full thickness LGE in the basal  inferior wall.   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    She returns for followup of CHF.  No chest pain.  Breathing is "up and down."  She is short of breath walking 50-100 feet, this is chronic.  She fatigues easily.  Weight down 5 lbs.  BP is low, she reports being "dizzy and tired" for about 1 week.  No falls or syncope.  [place] has been stopped due to elevated creatinine.  She is currently taking bumetanide 2 mg qam, 1 mg qpm.   ECG: NSR, septal Qs  Labs (12/21): K 5 => 3.7, creatinine 2.89 => 1.9 => 1.69, BNP 2904 Labs (1/22): K 4, creatinine 1.9, hgb 12.5 Labs (3/22): K 3.6, creatinine 2.36, hgb 11.6, TSH normal Labs (4/22): K 3.7, creatinine 2.25, LFTs normal, urine immunofixation normal, pro-BNP 6498 Labs (7/22): Myeloma panel with IgG monoclonal protein Labs (10/22): K 4.1, creatinine 1.96 Labs (12/22): BNP 549, TSH normal, K 4.3, creatinine 1.78 Labs (1/23): SCr 2.61, K 4.0, Hgb 11.4  Lab (3/23): Scr 2.29, K 3.9   PMH: 1. GERD 2. Rheumatoid arthritis: Followed by rheumatology in [place].  - No known pulmonary disease from RA.  3. Osteoporosis. 4. Gout 5. CKD stage 4 6. H/o SVT 7. Chronic systolic CHF: She was found to have nonischemic cardiomyopathy with low EF in 2015.  - LHC (2015): Normal coronaries.  -  cardiac MRI (2015): EF 37%, no LGE - Echo (3/21): EF 20-25%. - Echo (11/21): EF 20-25%, moderate LV dilation, global hypokinesis, biatrial enlargement, mildly decreased RV systolic function.  - Echo (12/21): EF < 20%, moderate LV dilation, normal RV size/systolic function, moderate MR, IVC normal.  - RHC (1/22): Mean RA 1, PA 39/14, mean PCWP 10, PVR 3.9 WU, CI 2.22.  - CPX (3/22): peak VO2 10.1, VE/VCO2 55, RER 1.13.  Severe functional limitation due to HF.  - TEE (4/22): EF < 20%, moderate LV dilation, mild RV dilation with moderate RV dysfunction, moderate central MR. - PYP scan  (4/22): grade 1 with H/CL 1.48. Equivocal. [employer] gene testing negative. - Cardiac MRI (10/22): mild LV dilation with EF 23%, normal RV size with RVEF 37%, ECV 34%, small area of subendocardial LGE in the mid inferolateral wall and small area of full thickness LGE in the basal inferior wall.  - PYP scan (3/23): grade 1, H/CL 1.3.  Probably negative.  8. Atrial fibrillation/flutter: TEE-DCCV in 4/22.  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  10. Varicella zoster with post-herpetic neuralgia  SH: From [place] but recently moved to [place] to live with daughter.  Has 3 children.  Nonsmoker.  No ETOH.   FH: Mother with CHF, father with MI.   ROS: All systems reviewed and negative except as per HPI.   Current Outpatient Medications  Medication Sig Dispense Refill   amiodarone (PACERONE) 200 MG tablet TAKE 1 TABLET BY MOUTH EVERY DAY 60 tablet 6   azaTHIOprine (IMURAN) 50 MG tablet TAKE 2 TABLETS BY MOUTH EVERY DAY 60 tablet 1   bumetanide (BUMEX) 2 MG tablet Take 2 mg by mouth 2 (two) times daily.     Cholecalciferol (VITAMIN D-3 PO) Take 25 mcg by mouth 3 (three) times a week.     cholestyramine (QUESTRAN) 4 g packet Take 1 packet (4 g total) by mouth daily. 60 each 12   cyclobenzaprine (FLEXERIL) 10 MG tablet Take 1 tablet (10 mg total) by mouth 3 (three) times daily as needed for muscle spasms. 30 tablet 0   ELIQUIS 2.5 MG TABS tablet TAKE 1 TABLET BY MOUTH TWICE A DAY 60 tablet 5   Ensure (ENSURE) Take 237 mLs by mouth 2 (two) times daily between meals.     folic acid (FOLVITE) 1 MG tablet TAKE 1 TABLET BY MOUTH EVERY DAY 30 tablet 11   gabapentin (NEURONTIN) 100 MG capsule Take 100 mg by mouth as needed.     hydroxychloroquine (PLAQUENIL) 200 MG tablet TAKE 1 TABLET BY MOUTH EVERY DAY 30 tablet 5   hydroxypropyl methylcellulose / hypromellose (ISOPTO TEARS / GONIOVISC) 2.5 % ophthalmic solution Place 1 drop into both eyes as needed for dry eyes.     Menthol,  Topical Analgesic, (ICY HOT BACK EX) Apply 1 application topically daily as needed (pain).     midodrine (PROAMATINE) 5 MG tablet Take 1 tablet (5 mg total) by mouth 3 (three) times daily with meals. 180 tablet 3   MITIGARE 0.6 MG CAPS Take 0.6 mg by mouth daily as needed (gout).     Multiple Vitamin (MULTIVITAMIN) tablet Take 1 tablet by mouth daily.     omeprazole (PRILOSEC) 40 MG capsule Take 40 mg by mouth daily.     polyethylene glycol (MIRALAX / GLYCOLAX) 17 g packet Take 17 g by mouth as needed.     predniSONE (DELTASONE) 5 MG tablet TAKE 1 TABLET BY MOUTH EVERY DAY WITH  BREAKFAST 30 tablet 1   traMADol (ULTRAM) 50 MG tablet Take 1 tablet (50 mg total) by mouth every 12 (twelve) hours as needed. 6 tablet 0   Vericiguat (VERQUVO) 10 MG TABS Take 10 mg by mouth daily. 90 tablet 3   No current facility-administered medications for this encounter.   BP (!) 80/50   Pulse 86   Wt 44.4 kg (97 lb 12.8 oz)   SpO2 98%   BMI 17.89 kg/m  PHYSICAL EXAM: General: NAD, frail Neck: No JVD, no thyromegaly or thyroid nodule.  Lungs: Mildly decreased breath sounds CV: Nondisplaced PMI.  Heart regular S1/S2, no S3/S4, no murmur.  No peripheral edema.  No carotid bruit.  Normal pedal pulses.  Abdomen: Soft, nontender, no hepatosplenomegaly, no distention.  Skin: Intact without lesions or rashes.  Neurologic: Alert and oriented x 3.  Psych: Normal affect. Extremities: No clubbing or cyanosis.  HEENT: Normal.   Assessment/Plan: 1. Chronic systolic CHF: Nonischemic cardiomyopathy.  This has been known since 2015, cath in 2015 showed no coronary disease and cardiac MRI in 2015 showed no LGE.  Cause is uncertain, familial cardiomyopathy is a concern given nonischemic cardiomyopathy in her mother.  Cannot rule out remote viral myocarditis. She remains quite limited, primarily by fatigue, NYHA class IIIb.  Poor appetite and severe HF limitation on CPX in 3/22 is concerning, as is elevated creatinine  (suspect cardiorenal syndrome).  Low BP and elevated creatinine have limited her cardiac med regimen. RHC (1/22) showed low filling pressures and actually a relatively preserved cardiac index of 2.22.  PYP scan in 4/22 was equivocal, PYP scan repeated in 3/23 and likely negative.  Cardiac MRI in 10/22 showed mild LV dilation with EF 23%, normal RV size with RVEF 37%, ECV 34%, small area of subendocardial LGE in the mid inferolateral wall and small area of full thickness LGE in the basal inferior wall.   This was not suggestive of cardiac amyloidosis but possibly could be consistent with coronary embolism (though no LV thrombus visualized) versus prior myocarditis versus sarcoidosis. She is not volume overloaded on exam, NYHA class III chronically. She is quite frail.  - Check BMP and BNP today  - Symptomatic low BP, will start midodrine 5 mg tid.  - Continue bumetanide 2 mg qam/1 mg qpm but may need to decrease if creatinine higher.   - Continue Verquvo 10 mg daily.  - Off dapagliflozin with rise in creatinine, with low BP would not restart.   - Off spironolactone with elevated creatinine and soft BP.   - Though RHC was not markedly abnormal, CPX showed severe functional limitation due to HF and BP also runs quite low.  I am worried that she is near end-stage.  She is frail with CKD stage 4, I do not think that she would be a candidate for LVAD.  - With renal failure, CHF, and atrial arrhythmias, we have assessed for cardiac amyloidosis.  PYP scan was equivocal in 4/22 for transthyretin cardiac amyloidosis.  It was repeated in 3/23 and likely negative.  [employer] gene testing for TTR mutations was negative. The cardiac MRI was not consistent with amyloidosis.  She has MGUS with monoclonal IgG paraprotein, but based on cMRI and slow progression, think AL amyloidosis is unlikely.   - Reasonable to consider barostimulation, will discuss with team.   - Does not have an ICD, would not place given NICM, advanced  age, and concern for nearing end stage HF.  2. CKD: Stage 4.   -  Check BMET today  3. Rheumatoid arthritis: No history of lung involvement. She has been on a low dose of prednisone chronically.  4. Atrial fibrillation/flutter: S/p TEE-DCCV in 4/22.  She is now on amiodarone and in NSR.  - Continue amiodarone 200 mg daily. Check LFTs and TSH.  Will need regular eye exam.  - Continue Eliquis 5 mg bid.  5. Shingles: left shoulder/neck. Has post-herpetic neuralgia.   Followup with APP 3-4 weeks.   Loralie Champagne 02/05/2022

## 2022-02-05 NOTE — Telephone Encounter (Signed)
Please call patient to schedule appt.Thank you.  Return in about 4 weeks (around 10/21/2021) for RA/corneal melt AZA titration/HCQ stop f/u 4wks.

## 2022-02-06 ENCOUNTER — Telehealth (HOSPITAL_COMMUNITY): Payer: Self-pay

## 2022-02-06 DIAGNOSIS — I5022 Chronic systolic (congestive) heart failure: Secondary | ICD-10-CM

## 2022-02-06 MED ORDER — BUMETANIDE 2 MG PO TABS: 2.0000 mg | ORAL_TABLET | Freq: Every day | ORAL | 3 refills | Status: DC

## 2022-02-06 NOTE — Telephone Encounter (Signed)
-----   Message from [name], MD sent at 02/04/2022  4:39 PM EDT ----- Creatinine up significantly.  Hold bumetanide for a day then decrease dose to 2 mg once daily.  BMET on Monday.

## 2022-02-06 NOTE — Telephone Encounter (Signed)
Patient advised and verbalized understanding. Med list updated to reflect changes. Patient will call back to schedule lab appointment. Lab order entered.   Orders Placed This Encounter  Procedures   Basic metabolic panel    Standing Status:   Future    Standing Expiration Date:   02/07/2023    Order Specific Question:   Release to patient    Answer:   Immediate   Meds ordered this encounter  Medications   bumetanide (BUMEX) 2 MG tablet    Sig: Take 1 tablet (2 mg total) by mouth daily.    Dispense:  90 tablet    Refill:  3    Please cancel all previous orders for current medication. Change in dosage or pill size.

## 2022-02-16 ENCOUNTER — Telehealth: Payer: Self-pay | Admitting: *Deleted

## 2022-02-16 NOTE — Telephone Encounter (Signed)
Spoke with patient about [employer] study  Will mail out information to patient for her review  Patient has visit on 07/26 with HF clinic   Philemon Kingdom :) RN BSN  Clinical Research Nurse  Be strong and take heart, all you who hope in the [place]. ~ Psalm 31:24

## 2022-02-19 ENCOUNTER — Encounter: Payer: Medicare PPO | Admitting: Nurse Practitioner

## 2022-02-24 ENCOUNTER — Encounter: Payer: Medicare PPO | Admitting: Nurse Practitioner

## 2022-02-25 ENCOUNTER — Ambulatory Visit: Payer: Medicare PPO | Admitting: Gastroenterology

## 2022-03-04 ENCOUNTER — Ambulatory Visit (HOSPITAL_COMMUNITY)
Admission: RE | Admit: 2022-03-04 | Discharge: 2022-03-04 | Disposition: A | Discharge status: Home / Self Care | Payer: Medicare PPO | Source: Ambulatory Visit | Attending: Family Medicine | Admitting: Family Medicine

## 2022-03-04 ENCOUNTER — Encounter (HOSPITAL_COMMUNITY): Payer: Self-pay

## 2022-03-04 VITALS — BP 102/72 | HR 100 | Wt 100.6 lb

## 2022-03-04 DIAGNOSIS — B028 Zoster with other complications: Secondary | ICD-10-CM

## 2022-03-04 DIAGNOSIS — R42 Dizziness and giddiness: Secondary | ICD-10-CM | POA: Diagnosis not present

## 2022-03-04 DIAGNOSIS — D472 Monoclonal gammopathy: Secondary | ICD-10-CM | POA: Insufficient documentation

## 2022-03-04 DIAGNOSIS — M069 Rheumatoid arthritis, unspecified: Secondary | ICD-10-CM | POA: Diagnosis not present

## 2022-03-04 DIAGNOSIS — M25512 Pain in left shoulder: Secondary | ICD-10-CM | POA: Diagnosis not present

## 2022-03-04 DIAGNOSIS — I428 Other cardiomyopathies: Secondary | ICD-10-CM | POA: Diagnosis not present

## 2022-03-04 DIAGNOSIS — R7989 Other specified abnormal findings of blood chemistry: Secondary | ICD-10-CM | POA: Insufficient documentation

## 2022-03-04 DIAGNOSIS — I4892 Unspecified atrial flutter: Secondary | ICD-10-CM

## 2022-03-04 DIAGNOSIS — Z8249 Family history of ischemic heart disease and other diseases of the circulatory system: Secondary | ICD-10-CM | POA: Insufficient documentation

## 2022-03-04 DIAGNOSIS — N184 Chronic kidney disease, stage 4 (severe): Secondary | ICD-10-CM | POA: Diagnosis not present

## 2022-03-04 DIAGNOSIS — R1110 Vomiting, unspecified: Secondary | ICD-10-CM | POA: Diagnosis not present

## 2022-03-04 DIAGNOSIS — R54 Age-related physical debility: Secondary | ICD-10-CM | POA: Insufficient documentation

## 2022-03-04 DIAGNOSIS — Z7952 Long term (current) use of systemic steroids: Secondary | ICD-10-CM | POA: Insufficient documentation

## 2022-03-04 DIAGNOSIS — I4891 Unspecified atrial fibrillation: Secondary | ICD-10-CM | POA: Diagnosis not present

## 2022-03-04 DIAGNOSIS — B0229 Other postherpetic nervous system involvement: Secondary | ICD-10-CM | POA: Insufficient documentation

## 2022-03-04 DIAGNOSIS — Z79899 Other long term (current) drug therapy: Secondary | ICD-10-CM | POA: Diagnosis not present

## 2022-03-04 DIAGNOSIS — Z7901 Long term (current) use of anticoagulants: Secondary | ICD-10-CM | POA: Insufficient documentation

## 2022-03-04 DIAGNOSIS — R0602 Shortness of breath: Secondary | ICD-10-CM | POA: Insufficient documentation

## 2022-03-04 DIAGNOSIS — I5022 Chronic systolic (congestive) heart failure: Secondary | ICD-10-CM

## 2022-03-04 DIAGNOSIS — Z7189 Other specified counseling: Secondary | ICD-10-CM

## 2022-03-04 LAB — BASIC METABOLIC PANEL
Anion gap: 11 (ref 5–15)
BUN: 36 mg/dL — ABNORMAL HIGH (ref 8–23)
CO2: 19 mmol/L — ABNORMAL LOW (ref 22–32)
Calcium: 9.4 mg/dL (ref 8.9–10.3)
Chloride: 105 mmol/L (ref 98–111)
Creatinine, Ser: 2.4 mg/dL — ABNORMAL HIGH (ref 0.44–1.00)
GFR, Estimated: 20 mL/min — ABNORMAL LOW (ref >60)
Glucose, Bld: 111 mg/dL — ABNORMAL HIGH (ref 70–99)
Potassium: 4.4 mmol/L (ref 3.5–5.1)
Sodium: 135 mmol/L (ref 135–145)

## 2022-03-04 LAB — FERRITIN: Ferritin: 410 ng/mL — ABNORMAL HIGH (ref 11–307)

## 2022-03-04 LAB — BRAIN NATRIURETIC PEPTIDE: B Natriuretic Peptide: 193.6 pg/mL — ABNORMAL HIGH (ref 0.0–100.0)

## 2022-03-04 LAB — IRON AND TIBC
Iron: 71 ug/dL (ref 28–170)
Saturation Ratios: 23 % (ref 10.4–31.8)
TIBC: 305 ug/dL (ref 250–450)
UIBC: 234 ug/dL

## 2022-03-04 NOTE — Progress Notes (Addendum)
Advanced Heart Failure Clinic Progress Note   PCP: [name], [place] Nephrology: Dr. Royce Macadamia HF Cardiologist: Dr. Aundra Dubin  76 y.o. with history of rheumatoid arthritis, CKD stage 3, and nonischemic cardiomyopathy was referred by Dr. Andrey Cota in [place] to establish heart failure care in [place].  Patient has been known to have a cardiomyopathy since 2015.  Cardiac MRI in 2015 showed EF 37% with no LGE.  Coronary angiography at that time showed no significant coronary disease.  Over the next few years, LV EF fluctuated up and down.  In 3/21, echo showed EF down to 20-25%.  She was admitted to the hospital in [place], [place] in 11/21 with CHF exacerbation.  Echo showed EF 20-25%, global hypokinesis, mildly decreased RV systolic function.  Creatinine was noted to be elevated. She was diuresed and sent home, but was readmitted later in 11/21 with ongoing volume overload. Creatinine was up to 2.89.  She was taken off most of her cardiac meds due to soft BP and elevated creatinine. She was discharged to her daughter's house in [place].   I repeated an echo on her in 12/21, EF < 20% with moderate LV dilation and mildly decreased RV systolic function. RHC was done, surprisingly showing low filling pressures and relatively preserved cardiac index at 2.22.   She was admitted in 3/22 with atrial fibrillation, converted to NSR on amiodarone. She went into atrial flutter after this and had TEE-DCCV in 4/22.  TEE showed EF < 20%, moderate LV dilation, mild RV dilation with moderate RV dysfunction, moderate central MR.  CPX in 3/22 showed severe HF limitation.  PYP scan 4/22 was grade 1 with H/CL 1.48, equivocal.  PYP scan repeated in 3/23 and likely negative.   She was evaluated for [facility] or ANTHEM, BNP too high and thought to be too frail.   Cardiac MRI in 10/22 showed mild LV dilation with EF 23%, normal RV size with RVEF 37%, ECV 34%, small area of subendocardial LGE in the mid inferolateral  wall and small area of full thickness LGE in the basal  inferior wall.   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    Follow up 6/23, BP low and midodrine started. Discussed [place]. Felt to be near end-stage HF.  Today she returns for HF follow up with her son. Overall feeling poorly. Reamins SOB with minimal activity, she is dizzy constantly. Appetite is poor and she is vomiting after she eats. Has poor energy and remains generally fatigued. Denies palpitations, abnormal bleeding, CP, edema, or PND/Orthopnea. No fever or chills. Weight at home 100 pounds. Taking all medications.   ECG: none ordered today.  Labs (12/21): K 5 => 3.7, creatinine 2.89 => 1.9 => 1.69, BNP 2904 Labs (1/22): K 4, creatinine 1.9, hgb 12.5 Labs (3/22): K 3.6, creatinine 2.36, hgb 11.6, TSH normal Labs (4/22): K 3.7, creatinine 2.25, LFTs normal, urine immunofixation normal, pro-BNP 6498 Labs (7/22): Myeloma panel with IgG monoclonal protein Labs (10/22): K 4.1, creatinine 1.96 Labs (12/22): BNP 549, TSH normal, K 4.3, creatinine 1.78 Labs (1/23): SCr 2.61, K 4.0, Hgb 11.4  Lab (3/23): Scr 2.29, K 3.9  Labs (6/23): K 4.1, creatinine 3.82, normal LFTs, normal TSH, hgb 9.4  PMH: 1. GERD 2. Rheumatoid arthritis: Followed by rheumatology in [place].  - No known pulmonary disease from RA.  3. Osteoporosis. 4. Gout 5. CKD stage 4 6. H/o SVT 7. Chronic systolic CHF: She was found to have nonischemic  cardiomyopathy with low EF in 2015.  - LHC (2015): Normal coronaries.  - cardiac MRI (2015): EF 37%, no LGE - Echo (3/21): EF 20-25%. - Echo (11/21): EF 20-25%, moderate LV dilation, global hypokinesis, biatrial enlargement, mildly decreased RV systolic function.  - Echo (12/21): EF < 20%, moderate LV dilation, normal RV size/systolic function, moderate MR, IVC normal.  - RHC (1/22): Mean RA 1, PA 39/14, mean PCWP 10, PVR 3.9 WU, CI 2.22.  - CPX (3/22): peak VO2 10.1,  VE/VCO2 55, RER 1.13.  Severe functional limitation due to HF.  - TEE (4/22): EF < 20%, moderate LV dilation, mild RV dilation with moderate RV dysfunction, moderate central MR. - PYP scan (4/22): grade 1 with H/CL 1.48. Equivocal. Inv[employer]ae gene testing negative. - Cardiac MRI (10/22): mild LV dilation with EF 23%, normal RV size with RVEF 37%, ECV 34%, small area of subendocardial LGE in the mid inferolateral wall and small area of full thickness LGE in the basal inferior wall.  - PYP scan (3/23): grade 1, H/CL 1.3.  Probably negative.  8. Atrial fibrillation/flutter: TEE-DCCV in 4/22.  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  10. Varicella zoster with post-herpetic neuralgia  SH: From [place] but recently moved to [place] to live with daughter.  Has 3 children.  Nonsmoker.  No ETOH.   FH: Mother with CHF, father with MI.   ROS: All systems reviewed and negative except as per HPI.   Current Outpatient Medications  Medication Sig Dispense Refill   amiodarone (PACERONE) 200 MG tablet TAKE 1 TABLET BY MOUTH EVERY DAY 60 tablet 6   azaTHIOprine (IMURAN) 50 MG tablet TAKE 2 TABLETS BY MOUTH EVERY DAY 60 tablet 1   bumetanide (BUMEX) 2 MG tablet Take 1 tablet (2 mg total) by mouth daily. 90 tablet 3   Cholecalciferol (VITAMIN D-3 PO) Take 25 mcg by mouth 3 (three) times a week.     cholestyramine (QUESTRAN) 4 g packet Take 1 packet (4 g total) by mouth daily. 60 each 12   cyclobenzaprine (FLEXERIL) 10 MG tablet Take 1 tablet (10 mg total) by mouth 3 (three) times daily as needed for muscle spasms. 30 tablet 0   ELIQUIS 2.5 MG TABS tablet TAKE 1 TABLET BY MOUTH TWICE A DAY 60 tablet 5   Ensure (ENSURE) Take 237 mLs by mouth 2 (two) times daily between meals.     folic acid (FOLVITE) 1 MG tablet TAKE 1 TABLET BY MOUTH EVERY DAY 30 tablet 11   gabapentin (NEURONTIN) 100 MG capsule Take 100 mg by mouth as needed.     hydroxychloroquine (PLAQUENIL) 200 MG tablet TAKE 1  TABLET BY MOUTH EVERY DAY 30 tablet 5   hydroxypropyl methylcellulose / hypromellose (ISOPTO TEARS / GONIOVISC) 2.5 % ophthalmic solution Place 1 drop into both eyes as needed for dry eyes.     Menthol, Topical Analgesic, (ICY HOT BACK EX) Apply 1 application topically daily as needed (pain).     midodrine (PROAMATINE) 5 MG tablet Take 1 tablet (5 mg total) by mouth 3 (three) times daily with meals. 180 tablet 3   MITIGARE 0.6 MG CAPS Take 0.6 mg by mouth daily as needed (gout).     Multiple Vitamin (MULTIVITAMIN) tablet Take 1 tablet by mouth daily.     omeprazole (PRILOSEC) 40 MG capsule Take 40 mg by mouth daily.     polyethylene glycol (MIRALAX / GLYCOLAX) 17 g packet Take 17 g by mouth as needed.  predniSONE (DELTASONE) 5 MG tablet TAKE 1 TABLET BY MOUTH EVERY DAY WITH BREAKFAST 30 tablet 1   traMADol (ULTRAM) 50 MG tablet Take 1 tablet (50 mg total) by mouth every 12 (twelve) hours as needed. 6 tablet 0   Vericiguat (VERQUVO) 10 MG TABS Take 10 mg by mouth daily. 90 tablet 3   No current facility-administered medications for this encounter.   Wt Readings from Last 3 Encounters:  03/04/22 45.6 kg (100 lb 9.6 oz)  02/04/22 44.4 kg (97 lb 12.8 oz)  12/04/21 46.3 kg (102 lb)   BP 102/72   Pulse 100   Wt 45.6 kg (100 lb 9.6 oz)   SpO2 99%   BMI 18.40 kg/m  Physical Exam: General:  NAD. No resp difficulty, arrived in [facility], cachectic HEENT: Normal Neck: Supple. No JVD. Carotids 2+ bilat; no bruits. No lymphadenopathy or thryomegaly appreciated. Cor: PMI nondisplaced. Regular rate & rhythm. No rubs, gallops or murmurs. Lungs: Clear Abdomen: Soft, nontender, nondistended. No hepatosplenomegaly. No bruits or masses. Good bowel sounds. Extremities: No cyanosis, clubbing, rash, edema Neuro: Alert & oriented x 3, cranial nerves grossly intact. Moves all 4 extremities w/o difficulty. Affect pleasant.  Assessment/Plan: 1. Chronic systolic CHF: Nonischemic cardiomyopathy.  This has been  known since 2015, cath in 2015 showed no coronary disease and cardiac MRI in 2015 showed no LGE.  Cause is uncertain, familial cardiomyopathy is a concern given nonischemic cardiomyopathy in her mother.  Cannot rule out remote viral myocarditis.Poor appetite and severe HF limitation on CPX in 3/22 is concerning, as is elevated creatinine (suspect cardiorenal syndrome).  Low BP and elevated creatinine have limited her cardiac med regimen. RHC (1/22) showed low filling pressures and actually a relatively preserved cardiac index of 2.22.  PYP scan in 4/22 was equivocal, PYP scan repeated in 3/23 and likely negative.  Cardiac MRI in 10/22 showed mild LV dilation with EF 23%, normal RV size with RVEF 37%, ECV 34%, small area of subendocardial LGE in the mid inferolateral wall and small area of full thickness LGE in the basal inferior wall.   This was not suggestive of cardiac amyloidosis but possibly could be consistent with coronary embolism (though no LV thrombus visualized) versus prior myocarditis versus sarcoidosis. She is not volume overloaded on exam, NYHA class III chronically. She is quite frail.  - Continue midodrine 5 mg tid. BP improved today. - Continue bumetanide 2 mg daily. Recently decreased with worsening renal function. BMET today. - Continue Verquvo 10 mg daily.  - Off dapagliflozin with rise in creatinine, with low BP would not restart.   - Off spironolactone with elevated creatinine and soft BP.   - Though RHC was not markedly abnormal, CPX showed severe functional limitation due to HF and BP also runs quite low.  I am worried that she is near end-stage.  She is frail with CKD stage 4, I do not think that she would be a candidate for LVAD.  - With renal failure, CHF, and atrial arrhythmias, we have assessed for cardiac amyloidosis.  PYP scan was equivocal in 4/22 for transthyretin cardiac amyloidosis.  It was repeated in 3/23 and likely negative.  Invitae gene testing for TTR mutations was  negative. The cardiac MRI was not consistent with amyloidosis.  She has MGUS with monoclonal IgG paraprotein, but based on cMRI and slow progression, think AL amyloidosis is unlikely.   - Reasonable to consider barostimulation, she has been given information on this therapy by research team.   -  Does not have an ICD, would not place given NICM, advanced age, and concern for nearing end stage HF.  2. CKD: Stage 4.  She is followed by Dr. Royce Macadamia. - Check BMET today  3. Rheumatoid arthritis: No history of lung involvement. She has been on a low dose of prednisone chronically.  4. Atrial fibrillation/flutter: S/p TEE-DCCV in 4/22.  She is now on amiodarone and in NSR.  - Continue amiodarone 200 mg daily. LFTs and TSH normal (6/23).  Will need regular eye exam.  - Continue Eliquis 5 mg bid.  5. Shingles: left shoulder/neck. Has post-herpetic neuralgia. She continues with significant left shoulder pain. She has PRN gabapentin, encouraged her to use. - May benefit from Lidocaine patch, will defer to PCP. 6. GOC: We discussed Palliative care today. She and her son are agreeable to referral, will arrange. - Check iron studies. Consider iron infusion to help with fatigue.  Follow up in 3 months with Dr. Aundra Dubin.  Maricela Bo [facility] [name]-BC 03/04/2022

## 2022-03-04 NOTE — Addendum Note (Signed)
Encounter addended by: Rafael Bihari, [name] on: 03/04/2022 7:31 PM  Actions taken: Clinical Note Signed

## 2022-03-04 NOTE — Patient Instructions (Signed)
There has been no changes to your medications.  Labs done today, your results will be available in MyChart, we will contact you for abnormal readings.  You have been referred to palliative care. They will call you to arrange your appointment.  Your physician recommends that you schedule a follow-up appointment in: 4 months (November 2023) ** please call the office in September to arrange your follow up appointment **  If you have any questions or concerns before your next appointment please send [place] a message through [place] or call our office at [phone].    TO LEAVE A MESSAGE FOR THE NURSE SELECT OPTION 2, PLEASE LEAVE A MESSAGE INCLUDING: YOUR NAME DATE OF BIRTH CALL BACK NUMBER REASON FOR CALL**this is important as we prioritize the call backs  YOU WILL RECEIVE A CALL BACK THE SAME DAY AS LONG AS YOU CALL BEFORE 4:00 PM  At the [facility], you and your health needs are our priority. As part of our continuing mission to provide you with exceptional heart care, we have created designated Provider Care Teams. These Care Teams include your primary Cardiologist (physician) and Advanced Practice Providers (APPs- Physician Assistants and Nurse Practitioners) who all work together to provide you with the care you need, when you need it.   You may see any of the following providers on your designated Care Team at your next follow up: Dr Glori Bickers Dr Haynes Kerns, NP Lyda Jester, [facility] [place], [place] Audry Riles, PharmD   Please be sure to bring in all your medications bottles to every appointment.

## 2022-03-05 ENCOUNTER — Telehealth: Payer: Self-pay | Admitting: Student

## 2022-03-05 NOTE — Telephone Encounter (Signed)
Attempted to contact patient to schedule Palliative Consult, no answer - left message requesting a return call.

## 2022-03-09 ENCOUNTER — Ambulatory Visit (INDEPENDENT_AMBULATORY_CARE_PROVIDER_SITE_OTHER): Payer: Medicare PPO | Admitting: Nurse Practitioner

## 2022-03-09 ENCOUNTER — Encounter: Payer: Self-pay | Admitting: Nurse Practitioner

## 2022-03-09 VITALS — BP 128/68 | HR 95 | Temp 98.4°F | Ht 62.0 in | Wt 102.0 lb

## 2022-03-09 DIAGNOSIS — I130 Hypertensive heart and chronic kidney disease with heart failure and stage 1 through stage 4 chronic kidney disease, or unspecified chronic kidney disease: Secondary | ICD-10-CM | POA: Diagnosis not present

## 2022-03-09 DIAGNOSIS — N184 Chronic kidney disease, stage 4 (severe): Secondary | ICD-10-CM | POA: Diagnosis not present

## 2022-03-09 DIAGNOSIS — I5020 Unspecified systolic (congestive) heart failure: Secondary | ICD-10-CM | POA: Diagnosis not present

## 2022-03-09 DIAGNOSIS — R42 Dizziness and giddiness: Secondary | ICD-10-CM

## 2022-03-09 DIAGNOSIS — Z0000 Encounter for general adult medical examination without abnormal findings: Secondary | ICD-10-CM

## 2022-03-09 DIAGNOSIS — D472 Monoclonal gammopathy: Secondary | ICD-10-CM

## 2022-03-09 DIAGNOSIS — B0229 Other postherpetic nervous system involvement: Secondary | ICD-10-CM

## 2022-03-09 DIAGNOSIS — Z79899 Other long term (current) drug therapy: Secondary | ICD-10-CM

## 2022-03-09 DIAGNOSIS — R7303 Prediabetes: Secondary | ICD-10-CM | POA: Diagnosis not present

## 2022-03-09 DIAGNOSIS — E559 Vitamin D deficiency, unspecified: Secondary | ICD-10-CM

## 2022-03-09 MED ORDER — LIDOCAINE 5 % EX PTCH: 1.0000 | MEDICATED_PATCH | CUTANEOUS | 0 refills | Status: DC

## 2022-03-09 MED ORDER — MECLIZINE HCL 12.5 MG PO TABS: 12.5000 mg | ORAL_TABLET | Freq: Three times a day (TID) | ORAL | 0 refills | Status: DC | PRN

## 2022-03-09 NOTE — Progress Notes (Addendum)
Barnet Glasgow Martin,acting as a Education administrator for [name], [name].,have documented all relevant documentation on the behalf of [name], [name],as directed by  [name], [name] while in the presence of [name], [place].   Subjective:     [name] , female    DOB: 1946/02/04 , 76 y.o.   MRN: [id number]   Chief Complaint  Patient presents with   Annual Exam    HPI  Patient presents today for HM, Patient doesn't have any other concerns today. She is originally from [place], [place] and she came here for  a second opinion. She had been going to [facility]. She is up many times at home. She has had vertigo for the last year. She is unable to walk long distances and when in wide open spaces. She has not seen an ENT or taken any meclizine. She is on a low salt diet - she is forcing foods. She is seeing   Wt Readings from Last 3 Encounters: 03/09/22 : 102 lb (46.3 kg) 03/04/22 : 100 lb 9.6 oz (45.6 kg) 02/04/22 : 97 lb 12.8 oz (44.4 kg)  Prior to getting sick she was 135 lbs. She drinks glucerna and Ensure - they cause her nausea she will do at least 1 day.   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   HFrEF (heart failure with reduced ejection fraction) ([place]) 07/15/2020   HTN (hypertension) 07/15/2020   Osteoporosis 07/15/2020   Palpitations 07/15/2020   Rheumatoid arthritis ([place]) 07/15/2020     Family History  Problem Relation Age of Onset   Hypertension Mother    Diabetes Mother    Hypertension Father    Diabetes Father    Breast cancer Maternal Aunt    Breast cancer Paternal Aunt    Arthritis Maternal Grandmother    Lung disease Paternal Grandfather    Cancer Brother      Current Outpatient Medications:     amiodarone (PACERONE) 200 MG tablet, TAKE 1 TABLET BY MOUTH EVERY DAY, Disp: 60 tablet, Rfl: 6   azaTHIOprine (IMURAN) 50 MG tablet, TAKE 2 TABLETS BY MOUTH EVERY DAY, Disp: 60 tablet, Rfl: 1   bumetanide (BUMEX) 2 MG tablet, Take 1 tablet (2 mg total) by mouth daily., Disp: 90 tablet, Rfl: 3   Cholecalciferol (VITAMIN D-3 PO), Take 25 mcg by mouth 3 (three) times a week., Disp: , Rfl:    cholestyramine (QUESTRAN) 4 g packet, Take 1 packet (4 g total) by mouth daily., Disp: 60 each, Rfl: 12   cyclobenzaprine (FLEXERIL) 10 MG tablet, Take 1 tablet (10 mg total) by mouth 3 (three) times daily as needed for muscle spasms., Disp: 30 tablet, Rfl: 0   ELIQUIS 2.5 MG TABS tablet, TAKE 1 TABLET BY MOUTH TWICE A DAY, Disp: 60 tablet, Rfl: 5   Ensure (ENSURE), Take 237 mLs by mouth 2 (two) times daily between meals., Disp: , Rfl:    folic acid (FOLVITE) 1 MG tablet, TAKE 1 TABLET BY MOUTH EVERY DAY, Disp: 30 tablet, Rfl: 11   gabapentin (NEURONTIN) 100 MG capsule, Take 100 mg by mouth as needed., Disp: , Rfl:    hydroxychloroquine (PLAQUENIL) 200 MG tablet, TAKE 1 TABLET BY MOUTH EVERY DAY, Disp:  30 tablet, Rfl: 5   hydroxypropyl methylcellulose / hypromellose (ISOPTO TEARS / GONIOVISC) 2.5 % ophthalmic solution, Place 1 drop into both eyes as needed for dry eyes., Disp: , Rfl:    lidocaine (LIDODERM) 5 %, Place 1 patch onto the skin daily. Remove & Discard patch within 12 hours or as directed by MD, Disp: 30 patch, Rfl: 0   meclizine (ANTIVERT) 12.5 MG tablet, Take 1 tablet (12.5 mg total) by mouth 3 (three) times daily as needed for dizziness. (Patient not taking: Reported on 03/25/2022), Disp: 30 tablet, Rfl: 0   Menthol, Topical Analgesic, (ICY HOT BACK EX), Apply 1 application topically daily as needed (pain)., Disp: , Rfl:    midodrine (PROAMATINE) 5 MG tablet, Take 1 tablet (5 mg total) by mouth 3 (three) times daily with meals., Disp: 180 tablet, Rfl: 3   MITIGARE 0.6 MG CAPS, Take 0.6 mg by mouth daily  as needed (gout)., Disp: , Rfl:    Multiple Vitamin (MULTIVITAMIN) tablet, Take 1 tablet by mouth daily., Disp: , Rfl:    omeprazole (PRILOSEC) 40 MG capsule, Take 40 mg by mouth daily., Disp: , Rfl:    polyethylene glycol (MIRALAX / GLYCOLAX) 17 g packet, Take 17 g by mouth as needed., Disp: , Rfl:    predniSONE (DELTASONE) 5 MG tablet, TAKE 1 TABLET BY MOUTH EVERY DAY WITH BREAKFAST, Disp: 30 tablet, Rfl: 1   traMADol (ULTRAM) 50 MG tablet, Take 1 tablet (50 mg total) by mouth every 12 (twelve) hours as needed. (Patient not taking: Reported on 03/25/2022), Disp: 6 tablet, Rfl: 0   Vericiguat (VERQUVO) 10 MG TABS, Take 10 mg by mouth daily., Disp: 90 tablet, Rfl: 3   Allergies  Allergen Reactions   Baclofen Nausea And Vomiting   Penicillin G     Other reaction(s): Unknown   Penicillins Rash      The patient states she is post menopausal status. No LMP recorded. Patient is postmenopausal.. Negative for Dysmenorrhea and Negative for Menorrhagia. Negative for: breast discharge, breast lump(s), breast pain and breast self exam. Associated symptoms include abnormal vaginal bleeding. Pertinent negatives include abnormal bleeding (hematology), anxiety, decreased libido, depression, difficulty falling sleep, dyspareunia, history of infertility, nocturia, sexual dysfunction, sleep disturbances, urinary incontinence, urinary urgency, vaginal discharge and vaginal itching. Diet regular.The patient states her exercise level is minimal - she did it during her PT/OT - not currently doing. She is able to go to the bathroom on her own. Does not cook but can warm food.   The patient's tobacco use is:  Social History   Tobacco Use  Smoking Status Never  Smokeless Tobacco Never  She has been exposed to passive smoke. The patient's alcohol use is:  Social History   Substance and Sexual Activity  Alcohol Use Never     Review of Systems  Constitutional: Negative.   HENT: Negative.    Eyes: Negative.    Respiratory: Negative.    Cardiovascular: Negative.   Gastrointestinal: Negative.   Endocrine: Negative.   Genitourinary: Negative.   Musculoskeletal: Negative.   Skin: Negative.   Allergic/Immunologic: Negative.   Neurological: Negative.   Hematological: Negative.   Psychiatric/Behavioral: Negative.       Today's Vitals   03/09/22 1115  BP: 128/68  Pulse: 95  Temp: 98.4 F (36.9 C)  TempSrc: Oral  Weight: 102 lb (46.3 kg)  Height: '5\' 2"'$  (1.575 m)  PainSc: 0-No pain   Body mass index is 18.66 kg/m.  Wt Readings from Last 3 Encounters:  03/09/22 102 lb (46.3 kg)  03/04/22 100 lb 9.6 oz (45.6 kg)  02/04/22 97 lb 12.8 oz (44.4 kg)     Objective:  Physical Exam Vitals reviewed.  Constitutional:      General: She is not in acute distress.    Appearance: Normal appearance. She is well-developed.  HENT:     Head: Normocephalic and atraumatic.     Right Ear: Hearing, tympanic membrane, ear canal and external ear normal. There is no impacted cerumen.     Left Ear: Hearing, tympanic membrane, ear canal and external ear normal. There is no impacted cerumen.     Nose:     Comments: Deferred - masked    Mouth/Throat:     Comments: Deferred - masked Eyes:     General: Lids are normal.     Extraocular Movements: Extraocular movements intact.     Conjunctiva/sclera: Conjunctivae normal.     Pupils: Pupils are equal, round, and reactive to light.     Funduscopic exam:    Right eye: No papilledema.        Left eye: No papilledema.  Neck:     Thyroid: No thyroid mass.     Vascular: No carotid bruit.  Cardiovascular:     Rate and Rhythm: Normal rate and regular rhythm.     Pulses: Normal pulses.     Heart sounds: Normal heart sounds. No murmur heard. Pulmonary:     Effort: Pulmonary effort is normal.     Breath sounds: Normal breath sounds.  Chest:     Chest wall: No mass.  Breasts:    Tanner Score is 5.     Right: Normal. No mass or tenderness.     Left: Normal. No  mass or tenderness.  Abdominal:     General: Abdomen is flat. Bowel sounds are normal. There is no distension.     Palpations: Abdomen is soft.     Tenderness: There is no abdominal tenderness.  Musculoskeletal:        General: No swelling or tenderness. Normal range of motion.     Cervical back: Full passive range of motion without pain, normal range of motion and neck supple.     Right lower leg: No edema.     Left lower leg: No edema.  Lymphadenopathy:     Upper Body:     Right upper body: No supraclavicular, axillary or pectoral adenopathy.     Left upper body: No supraclavicular, axillary or pectoral adenopathy.  Skin:    General: Skin is warm and dry.     Capillary Refill: Capillary refill takes less than 2 seconds.  Neurological:     General: No focal deficit present.     Mental Status: She is alert and oriented to person, place, and time.     Cranial Nerves: No cranial nerve deficit.     Sensory: No sensory deficit.  Psychiatric:        Mood and Affect: Mood normal.        Behavior: Behavior normal.        Thought Content: Thought content normal.        Judgment: Judgment normal.         Assessment And Plan:     1. Encounter for health maintenance examination Behavior modifications discussed and diet history reviewed.   Pt will continue to exercise regularly and modify diet with low GI, plant based foods and decrease intake of processed foods.  Recommend intake of daily multivitamin, Vitamin  D, and calcium.  Recommend mammogram for preventive screenings, as well as recommend immunizations that include influenza, TDAP, and Shingles (declined)  2. Prediabetes Comments: Stable, no current medications. 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 Post herpetic neuralgia Comments: Continues to have pain  however is slightly better.   5. HFrEF (heart failure with reduced ejection fraction) ([place]) Comments: Patient continues with Cardiology and there has been discussion about Palliative, I have encouraged her to talk with Palliative to discuss goals of care  6. Vitamin D deficiency Will check vitamin D level and supplement as needed.    Also encouraged to spend 15 minutes in the sun daily.   7. Monoclonal gammopathy Comments: Continue f/u with Hematology  8. Vertigo Comments: She is to take meclizine as needed.  - meclizine (ANTIVERT) 12.5 MG tablet; Take 1 tablet (12.5 mg total) by mouth 3 (three) times daily as needed for dizziness. (Patient not taking: Reported on 03/25/2022)  Dispense: 30 tablet; Refill: 0  9. Other long term (current) drug therapy  10. CKD (chronic kidney disease) stage 4, GFR 15-29 ml/min (HCC) Encouraged to stay well hydrated and avoid NSAIDs   Patient was given opportunity to ask questions. Patient verbalized understanding of the plan and was able to repeat key elements of the plan. All questions were answered to their satisfaction.   [name], [name]   I, [name], [name], have reviewed all documentation for this visit. The documentation on 03/09/22 for the exam, diagnosis, procedures, and orders are all accurate and complete.   THE PATIENT IS ENCOURAGED TO PRACTICE SOCIAL DISTANCING DUE TO THE COVID-19 PANDEMIC.

## 2022-03-09 NOTE — Patient Instructions (Signed)

## 2022-03-10 LAB — LIPID PANEL
Chol/HDL Ratio: 2.3 ratio (ref 0.0–4.4)
Cholesterol, Total: 205 mg/dL — ABNORMAL HIGH (ref 100–199)
HDL: 91 mg/dL (ref >39)
LDL Chol Calc (NIH): 95 mg/dL (ref 0–99)
Triglycerides: 111 mg/dL (ref 0–149)
VLDL Cholesterol Cal: 19 mg/dL (ref 5–40)

## 2022-03-10 LAB — HEMOGLOBIN A1C
Est. average glucose Bld gHb Est-mCnc: 105 mg/dL
Hgb A1c MFr Bld: 5.3 % (ref 4.8–5.6)

## 2022-03-13 ENCOUNTER — Telehealth: Payer: Self-pay

## 2022-03-13 NOTE — Chronic Care Management (AMB) (Signed)
    Tracy Nelson was reminded to have all medications, supplements and any blood glucose and blood pressure readings available for review with [name], Pharm. D, at her telephone visit on 03-17-2022 at 12:00.   [place] Pharmacist Assistant [phone]

## 2022-03-17 ENCOUNTER — Ambulatory Visit (INDEPENDENT_AMBULATORY_CARE_PROVIDER_SITE_OTHER): Payer: Medicare PPO

## 2022-03-17 ENCOUNTER — Other Ambulatory Visit: Payer: Medicare PPO | Admitting: Student

## 2022-03-17 DIAGNOSIS — Z515 Encounter for palliative care: Secondary | ICD-10-CM

## 2022-03-17 DIAGNOSIS — I5020 Unspecified systolic (congestive) heart failure: Secondary | ICD-10-CM

## 2022-03-17 DIAGNOSIS — E782 Mixed hyperlipidemia: Secondary | ICD-10-CM

## 2022-03-17 DIAGNOSIS — I5043 Acute on chronic combined systolic (congestive) and diastolic (congestive) heart failure: Secondary | ICD-10-CM

## 2022-03-17 DIAGNOSIS — R531 Weakness: Secondary | ICD-10-CM

## 2022-03-17 DIAGNOSIS — R630 Anorexia: Secondary | ICD-10-CM

## 2022-03-17 NOTE — Progress Notes (Signed)
Chronic Care Management Pharmacy Note  03/17/2022 Name:  Tracy Nelson MRN:  [id number] DOB:  [birth year]  Summary: Patient reports that she has an appointment coming up but she does not know where or when the appointment will be.   Recommendations/Changes made from today's visit: Recommend patient have a follow up appointment with PCP team in the next couple of weeks  Recommend patient does not start medication she has not been taking until she discusses this with her specialist Discussed with PCP concerns about patients current mental status and recommended follow up.   Plan: Discuss concerns with PCP, including dizziness/confusion  Patient is going to follow up with cardiology prior to restarting medication that she has not been taking  PCP reports she will look into patients health   Subjective: Tracy Nelson is an 76 y.o. year old female who is a primary patient of [name], [place].  The CCM team was consulted for assistance with disease management and care coordination needs.    Engaged with patient by telephone for follow up visit in response to provider referral for pharmacy case management and/or care coordination services. Patient reports that she is still dizzy and holding the wall to walk.   Consent to Services:  The patient was given information about Chronic Care Management services, agreed to services, and gave verbal consent prior to initiation of services.  Please see initial visit note for detailed documentation.   Patient Care Team: [name], [name] as PCP - General (General Practice) Jorge Ny, LCSW as Social Worker (Licensed Clinical Social Worker) Mayford Knife, [facility] (Pharmacist)  Recent office visits: 03/09/2022 - Started on Meclizine 12.5 mg tablet for dizziness   Recent consult visits: 03/04/2022 Cardiology appointment   Hospital visits: 11/01/2021 ED visit   Objective:  Lab Results  Component Value Date   CREATININE 2.40 (H)  03/04/2022   BUN 36 (H) 03/04/2022   EGFR 26 (L) 06/04/2021   GFRNONAA 20 (L) 03/04/2022   NA 135 03/04/2022   K 4.4 03/04/2022   CALCIUM 9.4 03/04/2022   CO2 19 (L) 03/04/2022   GLUCOSE 111 (H) 03/04/2022    Lab Results  Component Value Date/Time   HGBA1C 5.3 03/09/2022 02:34 PM   HGBA1C 6.4 (H) 09/04/2021 12:42 PM    Last diabetic Eye exam:  Lab Results  Component Value Date/Time   HMDIABEYEEXA No Retinopathy 10/21/2021 03:05 PM    Last diabetic Foot exam: No results found for: "HMDIABFOOTEX"   Lab Results  Component Value Date   CHOL 205 (H) 03/09/2022   HDL 91 03/09/2022   LDLCALC 95 03/09/2022   TRIG 111 03/09/2022   CHOLHDL 2.3 03/09/2022       Latest Ref Rng & Units 02/04/2022    3:29 PM 11/06/2021   12:22 PM 09/03/2021   12:25 PM  Hepatic Function  Total Protein 6.5 - 8.1 g/dL 6.0  7.0  6.2   Albumin 3.5 - 5.0 g/dL 3.3  3.5  3.0   AST 15 - 41 U/L 28  42  35   ALT 0 - 44 U/L 25  48  35   Alk Phosphatase 38 - 126 U/L 48  73  88   Total Bilirubin 0.3 - 1.2 mg/dL 0.5  0.4  0.6     Lab Results  Component Value Date/Time   TSH 1.585 02/04/2022 03:29 PM   TSH 1.709 09/03/2021 12:25 PM       Latest Ref Rng & Units  02/04/2022    3:29 PM 11/06/2021   12:22 PM 11/01/2021    8:16 PM  CBC  WBC 4.0 - 10.5 K/uL 8.1  9.0  8.1   Hemoglobin 12.0 - 15.0 g/dL 9.4  12.7  12.1   Hematocrit 36.0 - 46.0 % 29.8  38.8  37.9   Platelets 150 - 400 K/uL 253  238  222     No results found for: "VD25OH"  Clinical ASCVD: No  The 10-year ASCVD risk score (Arnett DK, et al., 2019) is: 20.2%   Values used to calculate the score:     Age: 76 years     Sex: Female     Is Non-Hispanic African American: Yes     Diabetic: No     Tobacco smoker: No     Systolic Blood Pressure: 665 mmHg     Is BP treated: Yes     HDL Cholesterol: 91 mg/dL     Total Cholesterol: 205 mg/dL       03/09/2022   11:13 AM 04/10/2021   10:15 AM 12/06/2020    8:44 AM  Depression screen PHQ 2/9   Decreased Interest 0 0 0  Down, Depressed, Hopeless 0 0 0  PHQ - 2 Score 0 0 0     Social History   Tobacco Use  Smoking Status Never  Smokeless Tobacco Never   BP Readings from Last 3 Encounters:  03/09/22 128/68  03/04/22 102/72  02/04/22 (!) 80/50   Pulse Readings from Last 3 Encounters:  03/09/22 95  03/04/22 100  02/04/22 86   Wt Readings from Last 3 Encounters:  03/09/22 102 lb (46.3 kg)  03/04/22 100 lb 9.6 oz (45.6 kg)  02/04/22 97 lb 12.8 oz (44.4 kg)   BMI Readings from Last 3 Encounters:  03/09/22 18.66 kg/m  03/04/22 18.40 kg/m  02/04/22 17.89 kg/m    Assessment/Interventions: Review of patient past medical history, allergies, medications, health status, including review of consultants reports, laboratory and other test data, was performed as part of comprehensive evaluation and provision of chronic care management services.   SDOH:  (Social Determinants of Health) assessments and interventions performed: No  SDOH Screenings   Alcohol Screen: Not on file  Depression (PHQ2-9): Low Risk  (03/09/2022)   Depression (PHQ2-9)    PHQ-2 Score: 0  Financial Resource Strain: High Risk (05/15/2021)   Overall Financial Resource Strain (CARDIA)    Difficulty of Paying Living Expenses: Very hard  Food Insecurity: No Food Insecurity (10/22/2020)   Hunger Vital Sign    Worried About Running Out of Food in the Last Year: Never true    Ran Out of Food in the Last Year: Never true  Housing: Low Risk  (10/22/2020)   Housing    Last Housing Risk Score: 0  Physical Activity: Inactive (04/10/2021)   Exercise Vital Sign    Days of Exercise per Week: 0 days    Minutes of Exercise per Session: 0 min  Social Connections: Not on file  Stress: Not on file  Tobacco Use: Low Risk  (03/09/2022)   Patient History    Smoking Tobacco Use: Never    Smokeless Tobacco Use: Never    Passive Exposure: Not on file  Transportation Needs: Unmet Transportation Needs (11/05/2021)    PRAPARE - Transportation    Lack of Transportation (Medical): Yes    Lack of Transportation (Non-Medical): Yes    CCM Care Plan  Allergies  Allergen Reactions   Baclofen Nausea And Vomiting  Penicillin G     Other reaction(s): Unknown   Penicillins Rash    Medications Reviewed Today     Reviewed by [name], [name] (Family Nurse Practitioner) on 03/09/22 at 1211  Med List Status: <None>   Medication Order Taking? Sig Documenting Provider Last Dose Status Informant  amiodarone (PACERONE) 200 MG tablet [id number] Yes TAKE 1 TABLET BY MOUTH EVERY DAY [name], MD Taking Active   azaTHIOprine (IMURAN) 50 MG tablet [id number] Yes TAKE 2 TABLETS BY MOUTH EVERY DAY Rice, Resa Miner, MD Taking Active   bumetanide (BUMEX) 2 MG tablet [id number] Yes Take 1 tablet (2 mg total) by mouth daily. [name], MD Taking Active   Cholecalciferol (VITAMIN D-3 PO) [id number] Yes Take 25 mcg by mouth 3 (three) times a week. [provider] Taking Active   cholestyramine (QUESTRAN) 4 g packet [id number] Yes Take 1 packet (4 g total) by mouth daily. [name], MD Taking Active Self  cyclobenzaprine (FLEXERIL) 10 MG tablet [id number] Yes Take 1 tablet (10 mg total) by mouth 3 (three) times daily as needed for muscle spasms. [name], [name] Taking Active   ELIQUIS 2.5 MG TABS tablet [id number] Yes TAKE 1 TABLET BY MOUTH TWICE A DAY [name], MD Taking Active   Ensure [facility]) [id number] Yes Take 237 mLs by mouth 2 (two) times daily between meals. [provider] Taking Active Self  folic acid (FOLVITE) 1 MG tablet [id number] Yes TAKE 1 TABLET BY MOUTH EVERY DAY [name], MD Taking Active   gabapentin (NEURONTIN) 100 MG capsule [id number] Yes Take 100 mg by mouth as needed. [provider] Taking Active   hydroxychloroquine (PLAQUENIL) 200 MG tablet [id number] Yes TAKE 1 TABLET BY MOUTH EVERY DAY [name], MD Taking Active   hydroxypropyl  methylcellulose / hypromellose (ISOPTO TEARS / GONIOVISC) 2.5 % ophthalmic solution [id number] Yes Place 1 drop into both eyes as needed for dry eyes. [provider] Taking Active   Menthol, Topical Analgesic, (ICY HOT BACK EX) [id number] Yes Apply 1 application topically daily as needed (pain). [provider] Taking Active Self  midodrine (PROAMATINE) 5 MG tablet [id number] Yes Take 1 tablet (5 mg total) by mouth 3 (three) times daily with meals. [name], MD Taking Active   MITIGARE 0.6 MG CAPS [id number] Yes Take 0.6 mg by mouth daily as needed (gout). [provider] Taking Active Self  Multiple Vitamin (MULTIVITAMIN) tablet [id number] Yes Take 1 tablet by mouth daily. [provider] Taking Active Self  omeprazole (PRILOSEC) 40 MG capsule [id number] Yes Take 40 mg by mouth daily. [provider] Taking Active Self  polyethylene glycol (MIRALAX / GLYCOLAX) 17 g packet [id number] Yes Take 17 g by mouth as needed. [provider] Taking Active Self  predniSONE (DELTASONE) 5 MG tablet [id number] Yes TAKE 1 TABLET BY MOUTH EVERY DAY WITH BREAKFAST Rice, Resa Miner, MD Taking Active   traMADol (ULTRAM) 50 MG tablet [id number] Yes Take 1 tablet (50 mg total) by mouth every 12 (twelve) hours as needed. Couture, Cortni S, PA-C Taking Active   Vericiguat (VERQUVO) 10 MG TABS [id number] Yes Take 10 mg by mouth daily. 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   Monoclonal gammopathy 03/10/2021   Hypotension 10/22/2020   Atypical chest pain 10/22/2020   CKD (chronic kidney  disease), stage III ([place]) 10/22/2020   Nausea & vomiting 10/22/2020   Physical deconditioning 10/22/2020   Atrial flutter ([place]) 10/18/2020   Arthritis 07/15/2020   GERD (gastroesophageal reflux disease) 07/15/2020   Colitis 07/15/2020   Non-ischemic cardiomyopathy ([place])  07/15/2020   HFrEF (heart failure with reduced ejection fraction) ([place]) 07/15/2020   HTN (hypertension) 07/15/2020   Osteoporosis 07/15/2020   Palpitations 07/15/2020   Acute gout of left shoulder 07/10/2020   Rheumatoid arthritis involving multiple sites with positive rheumatoid factor ([place]) 10/28/2015   Mitral regurgitation 05/30/2014   Acute on chronic combined systolic (congestive) and diastolic (congestive) heart failure ([place]) 05/19/2014   Vitamin D deficiency 05/15/2013   Encounter for long-term (current) use of other medications 12/14/2008   Sjogren's syndrome ([place]) 12/14/2008     There is no immunization history on file for this patient.  Conditions to be addressed/monitored:  Hyperlipidemia and CHF  Care Plan : [place]  Updates made by Mayford Knife, RPH since 03/17/2022 12:00 AM     Problem: HTN, CKD      Goal: Disease Management   Note:   Current Barriers:  Unable to independently monitor therapeutic efficacy  Pharmacist Clinical Goal(s):  Patient will achieve adherence to monitoring guidelines and medication adherence to achieve therapeutic efficacy through collaboration with PharmD and provider.   Interventions: 1:1 collaboration with [name], [name] regarding development and update of comprehensive plan of care as evidenced by provider attestation and co-signature Inter-disciplinary care team collaboration (see longitudinal plan of care) Comprehensive medication review performed; medication list updated in electronic medical record  Hyperlipidemia: (LDL goal < 55) -Uncontrolled -Current treatment: Cholestyramine 4 gram packet once per day Query Appropriate Patient reports that she does not take the medication daily -Current dietary patterns: will discuss further during next visit  -Current exercise habits: will discuss further during next visit  -Educated on Cholesterol goals;  -Patient reports that she has not been taking the  cholestyramine consistently -Recommended patient follow up with cardiology team for options   Heart Failure (Goal: manage symptoms and prevent exacerbations) -Controlled -Last ejection fraction: 23 (Date: 05/2021) -HF type: Systolic -Current treatment: Amiodarone 200 mg tablet once per day Appropriate, Effective, Safe, Accessible Eliquis 2.5 mg tablet twice per day Appropriate, Effective, Safe, Accessible Midodrine 5 mg -taking 1 tablet by mouth three times daily Appropriate, Effective, Safe, Accessible Bumex 2 mg - taking 1 tablet by mouth daily Appropriate, Effective, Safe, Accessible -Current home BP/HR readings: 100/60 HR: 93 -Current dietary habits: patient to limit high salt foods  -Current exercise habits: she is doing some walking to the bathroom and kitchen and her bedroom.  -Educated on Importance of weighing daily; if you gain more than 3 pounds in one day or 5 pounds in one week, and following the guidelines provided by cardiology team Importance of blood pressure control -Recommended to continue current medication  Patient Goals/Self-Care Activities Patient will:  - take medications as prescribed as evidenced by patient report and record review  Follow Up Plan: The patient has been provided with contact information for the care management team and has been advised to call with any health related questions or concerns.       Medication Assistance: None required.  Patient affirms current coverage meets needs.  Compliance/Adherence/Medication fill history: Care Gaps: TDAP Influenza Vaccine  Star-Rating Drugs: None noted   Patient's preferred pharmacy is:  [id number] [place] [id number]: [phone]  Fax: [place] [address] [address] Phone: [phone] Fax: [phone]  [id number] [place] [place] [address]Phone: [phone]: [phone] Uses pill box? No - to follow up with patient  Pt endorses 85% compliance  We discussed: Benefits of medication synchronization, packaging and delivery as well as enhanced pharmacist oversight with [facility]. Patient decided to: Continue current medication management strategy  Care Plan and Follow Up Patient Decision:  Patient agrees to Care Plan and Follow-up.  Plan: The patient has been provided with contact information for the care management team and has been advised to call with any health related questions or concerns.   VOrlando Penner CPP, PharmD Clinical Pharmacist Practitioner [facility] [phone]

## 2022-03-17 NOTE — Progress Notes (Signed)
[facility] Consult Note Telephone: [phone]  Fax: [phone]   Date of encounter: 03/17/22 10:03 AM PATIENT NAME: Tracy Nelson [address] [place] [address]   [phone] (home)  DOB: 10/31/1945 MRN: [id number] PRIMARY CARE PROVIDER:    [name], [place],  [address] [place] [address] [phone]  REFERRING PROVIDER:   [name], [place],  [place] [address] [phone]  RESPONSIBLE PARTY:    Contact Information     Name Relation Home Work Normangee Daughter [phone]  [phone]   Gustavus Bryant [phone]  [phone]       Due to the COVID-19 crisis, this visit was done via telemedicine from my office and it was initiated and consent by this patient.   This home visit was completed via telephone due to the patient's inability to connect via an audiovisual connection or their refusal to have an in-person visit. This connection was agreed to by the patient. I verified that I am speaking with the correct person using two identifiers.                                   ASSESSMENT AND PLAN / RECOMMENDATIONS:   Advance Care Planning/Goals of Care: Goals include to maximize quality of life and symptom management. Patient/health care surrogate gave his/her permission to discuss.Our advance care planning conversation included a discussion about:    The value and importance of advance care planning  Experiences with loved ones who have been seriously ill or have died  Exploration of personal, cultural or spiritual beliefs that might influence medical decisions  Exploration of goals of care in the event of a sudden injury or illness  CODE STATUS: Full Code  Education provided on Palliative Medicine vs. Hospice services. Patient would like to maintain her current functional status, improve if possible to keep from transitioning to  hospice.   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 Endorses shortness of breath with exertion, dizziness, fatigue and weakness. Continue Bumex, daily weights. Monitor for worsening symptoms. Follow up with cardiology as scheduled.    Generalized weakness-due to HF, cardiomyopathy. Endorses weakness to BLE. We discussed PT referral; she is interested. She will need to be seen in person; f/u visit scheduled for 03/25/22.   Decline in appetite-patient endorses a 35 pound weight loss in the past year. Recommend nutritional supplements BID, monitor sodium intake. Use salt alternatives such as Ms. Dash.  Follow up Palliative Care Visit: Palliative care will continue to follow for complex medical decision making, advance care planning, and clarification of goals. Return in 2 weeks or prn.  I spent 23 minutes providing this consultation. More than 50% of the time in this consultation was spent in counseling and care coordination.    HOSPICE ELIGIBILITY/DIAGNOSIS: TBD  Chief Complaint: Palliative Medicine initial visit.  HISTORY OF PRESENT ILLNESS:  Tracy Nelson is a 76 y.o. year old female  with chronic combined systolic and diastolic heart failure, nonischemic cardiomyopathy, hypertension, hypotension, RA, osteoporosis, CKD 4, GERD, vitamin D deficiency, vertigo.  Endorses shortness of breath with exertion. No LE edema; weighs self daily. She endorses poor appetite; she has been drinking Glucerna or Ensures. No falls; she does hold on to walls when ambulating and expresses increased weakness. She expresses wanting to maintain her current level of care.  History obtained from review of EMR, discussion with primary team, and interview with family, facility staff/caregiver and/or Ms. Worton.  I reviewed available labs, medications, imaging, studies and related documents from the EMR.  Records reviewed and summarized above.   ROS  10-Point ROS is  negative, except for the pertinent positives and negatives detailed per the HPI.  Physical Exam: Deferred d/t this being a telemedicine visit.   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   Monoclonal gammopathy 03/10/2021   Hypotension 10/22/2020   Atypical chest pain 10/22/2020   CKD (chronic kidney disease), stage III ([place]) 10/22/2020   Nausea & vomiting 10/22/2020   Physical deconditioning 10/22/2020   Atrial flutter ([place]) 10/18/2020   Arthritis 07/15/2020   GERD (gastroesophageal reflux disease) 07/15/2020   Colitis 07/15/2020   Non-ischemic cardiomyopathy ([place]) 07/15/2020   HFrEF (heart failure with reduced ejection fraction) ([place]) 07/15/2020   HTN (hypertension) 07/15/2020   Osteoporosis 07/15/2020   Palpitations 07/15/2020   Acute gout of left shoulder 07/10/2020   Rheumatoid arthritis involving multiple sites with positive rheumatoid factor ([place]) 10/28/2015   Mitral regurgitation 05/30/2014   Acute on chronic combined systolic (congestive) and diastolic (congestive) heart failure ([place]) 05/19/2014   Vitamin D deficiency 05/15/2013   Encounter for long-term (current) use of other medications 12/14/2008   Sjogren's syndrome ([place]) 12/14/2008   PAST MEDICAL HISTORY:  Active Ambulatory Problems    Diagnosis Date Noted   Arthritis 07/15/2020   GERD (gastroesophageal reflux disease) 07/15/2020   Colitis 07/15/2020   Non-ischemic cardiomyopathy ([place]) 07/15/2020   HFrEF (heart failure with reduced ejection fraction) ([place]) 07/15/2020   HTN (hypertension) 07/15/2020   Osteoporosis 07/15/2020   Palpitations 07/15/2020   Rheumatoid arthritis involving multiple sites with positive rheumatoid factor ([place]) 10/28/2015   Atrial flutter ([place]) 10/18/2020   Hypotension 10/22/2020   Atypical chest pain 10/22/2020   CKD (chronic kidney disease), stage III ([facility]) 10/22/2020   Nausea & vomiting 10/22/2020   Physical  deconditioning 10/22/2020   Acute gout of left shoulder 07/10/2020   Acute on chronic combined systolic (congestive) and diastolic (congestive) heart failure ([place]) 05/19/2014   Encounter for long-term (current) use of other medications 12/14/2008   Vitamin D deficiency 05/15/2013   Mitral regurgitation 05/30/2014   Sjogren's syndrome ([place]) 12/14/2008   Monoclonal gammopathy 03/10/2021   Corneal melt, 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   Resolved Ambulatory Problems    Diagnosis Date Noted   No Resolved Ambulatory Problems   Past Medical History:  Diagnosis Date   Acid reflux 07/15/2020   CHF (congestive heart failure) (H[facility])    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   Rheumatoid arthritis ([place]) 07/15/2020   SOCIAL HX:  Social History   Tobacco Use   Smoking status: Never   Smokeless tobacco: Never  Substance Use Topics   Alcohol use: Never   FAMILY HX:  Family History  Problem Relation Age of Onset   Hypertension Mother    Diabetes Mother    Hypertension Father    Diabetes Father    Breast cancer Maternal Aunt    Breast cancer Paternal Aunt    Arthritis Maternal Grandmother    Lung disease Paternal Grandfather    Cancer Brother       ALLERGIES:  Allergies  Allergen Reactions   Baclofen Nausea And Vomiting   Penicillin G     Other reaction(s): Unknown   Penicillins Rash     PERTINENT MEDICATIONS:  Outpatient Encounter  Medications as of 03/17/2022  Medication Sig   amiodarone (PACERONE) 200 MG tablet TAKE 1 TABLET BY MOUTH EVERY DAY   azaTHIOprine (IMURAN) 50 MG tablet TAKE 2 TABLETS BY MOUTH EVERY DAY   bumetanide (BUMEX) 2 MG tablet Take 1 tablet (2 mg total) by mouth daily.   Cholecalciferol (VITAMIN D-3 PO) Take 25 mcg by mouth 3 (three) times a week.   cholestyramine (QUESTRAN) 4 g packet Take 1 packet (4 g total) by mouth daily.   cyclobenzaprine (FLEXERIL) 10 MG tablet Take 1 tablet (10 mg total) by mouth 3 (three) times daily as needed for muscle spasms.   ELIQUIS 2.5  MG TABS tablet TAKE 1 TABLET BY MOUTH TWICE A DAY   Ensure (ENSURE) Take 237 mLs by mouth 2 (two) times daily between meals.   folic acid (FOLVITE) 1 MG tablet TAKE 1 TABLET BY MOUTH EVERY DAY   gabapentin (NEURONTIN) 100 MG capsule Take 100 mg by mouth as needed.   hydroxychloroquine (PLAQUENIL) 200 MG tablet TAKE 1 TABLET BY MOUTH EVERY DAY   hydroxypropyl methylcellulose / hypromellose (ISOPTO TEARS / GONIOVISC) 2.5 % ophthalmic solution Place 1 drop into both eyes as needed for dry eyes.   lidocaine (LIDODERM) 5 % Place 1 patch onto the skin daily. Remove & Discard patch within 12 hours or as directed by MD   meclizine (ANTIVERT) 12.5 MG tablet Take 1 tablet (12.5 mg total) by mouth 3 (three) times daily as needed for dizziness.   Menthol, Topical Analgesic, (ICY HOT BACK EX) Apply 1 application topically daily as needed (pain).   midodrine (PROAMATINE) 5 MG tablet Take 1 tablet (5 mg total) by mouth 3 (three) times daily with meals.   MITIGARE 0.6 MG CAPS Take 0.6 mg by mouth daily as needed (gout).   Multiple Vitamin (MULTIVITAMIN) tablet Take 1 tablet by mouth daily.   omeprazole (PRILOSEC) 40 MG capsule Take 40 mg by mouth daily.   polyethylene glycol (MIRALAX / GLYCOLAX) 17 g packet Take 17 g by mouth as needed.   predniSONE (DELTASONE) 5 MG tablet TAKE 1 TABLET BY MOUTH EVERY DAY WITH BREAKFAST   traMADol (ULTRAM) 50 MG tablet Take 1 tablet (50 mg total) by mouth every 12 (twelve) hours as needed.   Vericiguat (VERQUVO) 10 MG TABS Take 10 mg by mouth daily.   No facility-administered encounter medications on file as of 03/17/2022.   Thank you for the opportunity to participate in the care of Ms. Lewallen.  The palliative care team will continue to follow. Please call our office at [phone] if we can be of additional assistance.   Ezekiel Slocumb, NP   COVID-19 PATIENT SCREENING TOOL Asked and negative response unless otherwise noted:  Have you had symptoms of covid, tested  positive or been in contact with someone with symptoms/positive test in the past 5-10 days? No

## 2022-03-18 ENCOUNTER — Other Ambulatory Visit: Payer: Self-pay | Admitting: Nurse Practitioner

## 2022-03-18 DIAGNOSIS — R2681 Unsteadiness on feet: Secondary | ICD-10-CM

## 2022-03-18 NOTE — Patient Instructions (Signed)
Visit Information It was great speaking with you today!  Please let me know if you have any questions about our visit.   Goals Addressed             This Visit's Progress    Manage My Medicine       Timeframe:  Long-Range Goal Priority:  High Start Date:                             Expected End Date:                       Follow Up Date 05/12/2022   - call for medicine refill 2 or 3 days before it runs out - call if I am sick and can't take my medicine - keep a list of all the medicines I take; vitamins and herbals too - use a pillbox to sort medicine    Why is this important?   These steps will help you keep on track with your medicines.   Notes:  Please call with any questions.         Patient Care Plan: CCM Pharmacy Care Plan     Problem Identified: HLD, CHF      Goal: Disease Management   Note:   Current Barriers:  Unable to independently monitor therapeutic efficacy  Pharmacist Clinical Goal(s):  Patient will achieve adherence to monitoring guidelines and medication adherence to achieve therapeutic efficacy through collaboration with PharmD and provider.   Interventions: 1:1 collaboration with [name], [name] regarding development and update of comprehensive plan of care as evidenced by provider attestation and co-signature Inter-disciplinary care team collaboration (see longitudinal plan of care) Comprehensive medication review performed; medication list updated in electronic medical record  Hyperlipidemia: (LDL goal < 55) -Uncontrolled -Current treatment: Cholestyramine 4 gram packet once per day Query Appropriate Patient reports that she does not take the medication daily -Current dietary patterns: will discuss further during next visit  -Current exercise habits: will discuss further during next visit  -Educated on Cholesterol goals;  -Patient reports that she has not been taking the cholestyramine consistently -Recommended patient follow up with  cardiology team for options   Heart Failure (Goal: manage symptoms and prevent exacerbations) -Controlled -Last ejection fraction: 23 (Date: 05/2021) -HF type: Systolic -Current treatment: Amiodarone 200 mg tablet once per day Appropriate, Effective, Safe, Accessible Eliquis 2.5 mg tablet twice per day Appropriate, Effective, Safe, Accessible Midodrine 5 mg -taking 1 tablet by mouth three times daily Appropriate, Effective, Safe, Accessible Bumex 2 mg - taking 1 tablet by mouth daily Appropriate, Effective, Safe, Accessible -Current home BP/HR readings: 100/60 HR: 93 -Current dietary habits: patient to limit high salt foods  -Current exercise habits: she is doing some walking to the bathroom and kitchen and her bedroom.  -Educated on Importance of weighing daily; if you gain more than 3 pounds in one day or 5 pounds in one week, and following the guidelines provided by cardiology team Importance of blood pressure control -Recommended to continue current medication  Patient Goals/Self-Care Activities Patient will:  - take medications as prescribed as evidenced by patient report and record review  Follow Up Plan: The patient has been provided with contact information for the care management team and has been advised to call with any health related questions or concerns.       Patient agreed to services and verbal consent obtained.   The patient verbalized  understanding of instructions, educational materials, and care plan provided today and agreed to receive a mailed copy of patient instructions, educational materials, and care plan.   [name], PharmD Clinical Pharmacist [facility] [phone]

## 2022-03-20 ENCOUNTER — Encounter (HOSPITAL_COMMUNITY)
Admission: RE | Admit: 2022-03-20 | Discharge: 2022-03-20 | Disposition: A | Discharge status: Home / Self Care | Payer: Medicare PPO | Source: Ambulatory Visit | Attending: Nephrology | Admitting: Nephrology

## 2022-03-20 VITALS — BP 90/65 | HR 82 | Temp 96.5°F

## 2022-03-20 DIAGNOSIS — N183 Chronic kidney disease, stage 3 unspecified: Secondary | ICD-10-CM | POA: Insufficient documentation

## 2022-03-20 LAB — POCT HEMOGLOBIN-HEMACUE: Hemoglobin: 9.2 g/dL — ABNORMAL LOW (ref 12.0–15.0)

## 2022-03-20 MED ORDER — EPOETIN ALFA 10000 UNIT/ML IJ SOLN
INTRAMUSCULAR | Status: AC
  Filled 2022-03-20: qty 1

## 2022-03-20 MED ORDER — EPOETIN ALFA 10000 UNIT/ML IJ SOLN
10000.0000 [IU] | Freq: Once | INTRAMUSCULAR | Status: AC
  Administered 2022-03-20: 10000 [IU] via SUBCUTANEOUS

## 2022-03-20 MED ORDER — EPOETIN ALFA-EPBX 10000 UNIT/ML IJ SOLN: 10000.0000 [IU] | INTRAMUSCULAR | Status: DC

## 2022-03-25 ENCOUNTER — Other Ambulatory Visit: Payer: Medicare PPO | Admitting: Student

## 2022-03-25 DIAGNOSIS — R42 Dizziness and giddiness: Secondary | ICD-10-CM

## 2022-03-25 DIAGNOSIS — R634 Abnormal weight loss: Secondary | ICD-10-CM

## 2022-03-25 DIAGNOSIS — Z515 Encounter for palliative care: Secondary | ICD-10-CM

## 2022-03-25 DIAGNOSIS — R531 Weakness: Secondary | ICD-10-CM

## 2022-03-25 DIAGNOSIS — M0579 Rheumatoid arthritis with rheumatoid factor of multiple sites without organ or systems involvement: Secondary | ICD-10-CM

## 2022-03-25 DIAGNOSIS — I959 Hypotension, unspecified: Secondary | ICD-10-CM

## 2022-03-25 DIAGNOSIS — I5043 Acute on chronic combined systolic (congestive) and diastolic (congestive) heart failure: Secondary | ICD-10-CM

## 2022-03-25 DIAGNOSIS — K219 Gastro-esophageal reflux disease without esophagitis: Secondary | ICD-10-CM

## 2022-03-25 NOTE — [place] Notes (Signed)
[facility] Consult Note Telephone: [phone]  Fax: [phone]    Date of encounter: 03/25/22 2:11 PM PATIENT NAME: Tracy Nelson [address] [place] [address]   [phone] (home)  DOB: Jan 30, 1946 MRN: [id number] PRIMARY CARE PROVIDER:    Minette Nelson, [place],  [address] [place] [address] [phone]  REFERRING PROVIDER:   Minette Nelson, [place],  [place] [address] [phone]  RESPONSIBLE PARTY:    Contact Information     Name Relation Home Work [place] Daughter [phone]  [phone]   Tracy Nelson [phone]  [phone]        I met face to face with patient in the home. Palliative Care was asked to follow this patient by consultation request of  Tracy Brine, [name] to address advance care planning and complex medical decision making. This is a follow up visit.                                   ASSESSMENT AND PLAN / RECOMMENDATIONS:   Advance Care Planning/Goals of Care: Goals include to maximize quality of life and symptom management. Patient/health care surrogate gave his/her permission to discuss. Our advance care planning conversation included a discussion about:    The value and importance of advance care planning  Experiences with loved ones who have been seriously ill or have died  Exploration of personal, cultural or spiritual beliefs that might influence medical decisions  Reviewed MOST form. Patient to review further; will complete on next visit.   CODE STATUS: Full Code  Education provided on Palliative medicine services. Discussed differences between palliative medicine and hospice services. Patient would like to receive therapy, maintain current level of care.   I spent 17 minutes providing this consultation. More than 50% of the time in this consultation was spent in counseling and care  coordination.  ---------------------------------------------------------------------------------------------------------------   Symptom Management/Plan:  Weight loss-patient 96 pounds today; varies between 96-100 pounds. Was 135 pounds a year ago. She is encouraged to eat foods she enjoys, continue nutritional supplements BID, recommend trying Ensure clear or comparable as she has difficulty drinking the milk based supplements at times.    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 Endorses shortness of breath with exertion, dizziness, fatigue. Continue Bumex, daily weights. Monitor for worsening symptoms. Follow up with cardiology as scheduled.   Hypotension- she is taking midodrine twice a day. Encourage her to take TID as ordered. Education on changing positions slowly to help with dizziness.   Generalized weakness-due to HF, cardiomyopathy. endorses weakness to BLE. Able to bath self, but has to take rest breaks. Recommend PT/OT to evaluate and treat d/t LE weakness, impaired balance, dizziness.   Rheumatoid arthritis-continue prednisone, imuran and plaquenil as directed.   Dizziness-due to her HF, cardiomyopathy. Encourage changing positions slowly, taking her midodrine as directed. She has PRN hydroxyzine ordered but has not used. We discussed if she does take, take 12.5 mg dosage PRN; use sparingly.   GERD- taking omeprazole PRN; recommend taking routinely each AM on empty stomach, Tums 1-2 tablets TID PRN.   Follow up Palliative Care Visit: Palliative care will continue to follow for complex medical decision making, advance care planning, and clarification of goals. Return in 8 weeks or prn.  This visit was coded based on medical decision making (MDM).  PPS: 60%  HOSPICE ELIGIBILITY/DIAGNOSIS:  TBD  Chief Complaint: Palliative Medicine follow up visit.   HISTORY OF PRESENT ILLNESS:  Tracy Nelson is a 76 y.o. year old female  with chronic combined systolic and  diastolic heart failure, nonischemic cardiomyopathy, hypertension, hypotension, RA, osteoporosis, CKD 4, GERD, vitamin D deficiency, vertigo.   Patient resides at home with her daughter's family. She endorses shortness of breath with exertion, increased weakness. She has dizziness. No palpitations or chest pain. No falls reported. Her appetite has been poor; she is drinking some nutritional supplements, but is having more difficulty drinking the milk based supplements. She does report GERD symptoms. Able to bath self, but has to take rest breaks. Has a shower chair. She is wanting to go out of town to visit her home town. 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  History obtained from review of EMR, discussion with primary team, and interview with family, facility staff/caregiver and/or Tracy Nelson.  I reviewed available labs, medications, imaging, studies and related documents from the EMR.  Records reviewed and summarized above.   ROS  A 10-point ROS is negative, except for the pertinent positives and negatives detailed per the HPI.   Physical Exam: Pulse 76, resp 16, b/p 102/58, sats 96-97% on room air Weight: 96 pounds Constitutional: NAD General: frail appearing, thin EYES: anicteric sclera, lids intact, no discharge  ENMT: intact hearing, oral mucous membranes moist, dentition intact CV: S1S2, RRR, no LE edema Pulmonary: LCTA, no increased work of breathing, no cough, room air Abdomen: normo-active BS + 4 quadrants, soft and non tender, no ascites GU: deferred MSK: + sarcopenia, moves all extremities, ambulatory Skin: cool and dry, no rashes or wounds on visible skin Neuro: +generalized weakness,  no cognitive impairment Psych: non-anxious affect, A and O x 3, pleasant Hem/lymph/immuno: no widespread bruising   Thank you for the opportunity to participate in the care of Tracy Nelson.  The palliative care team will continue to follow. Please call our office at  [phone] if we can be of additional assistance.   Tracy Slocumb, NP   COVID-19 PATIENT SCREENING TOOL Asked and negative response unless otherwise noted:   Have you had symptoms of covid, tested positive or been in contact with someone with symptoms/positive test in the past 5-10 days? No

## 2022-04-03 ENCOUNTER — Encounter (HOSPITAL_COMMUNITY)
Admission: RE | Admit: 2022-04-03 | Discharge: 2022-04-03 | Disposition: A | Discharge status: Home / Self Care | Payer: Medicare PPO | Source: Ambulatory Visit | Attending: Nephrology | Admitting: Nephrology

## 2022-04-03 VITALS — BP 88/66 | HR 95 | Temp 97.5°F | Resp 18

## 2022-04-03 DIAGNOSIS — N183 Chronic kidney disease, stage 3 unspecified: Secondary | ICD-10-CM

## 2022-04-03 MED ORDER — EPOETIN ALFA-EPBX 10000 UNIT/ML IJ SOLN: 10000.0000 [IU] | INTRAMUSCULAR | Status: DC

## 2022-04-03 MED ORDER — EPOETIN ALFA 10000 UNIT/ML IJ SOLN
INTRAMUSCULAR | Status: AC
  Filled 2022-04-03: qty 1

## 2022-04-03 MED ORDER — EPOETIN ALFA 10000 UNIT/ML IJ SOLN
10000.0000 [IU] | Freq: Once | INTRAMUSCULAR | Status: AC
  Administered 2022-04-03: 10000 [IU] via SUBCUTANEOUS

## 2022-04-09 DIAGNOSIS — I5020 Unspecified systolic (congestive) heart failure: Secondary | ICD-10-CM

## 2022-04-09 DIAGNOSIS — E785 Hyperlipidemia, unspecified: Secondary | ICD-10-CM

## 2022-04-10 IMAGING — US US RENAL
1 series · 14 of 25 positions shown · non-contrast
Comparison: None.

CLINICAL DATA: Chronic renal disease stage IV.

EXAM:
RENAL / URINARY TRACT ULTRASOUND COMPLETE

[Series 1: us renal · 0.20mm/px · 14 of 36 slices shown]
[im 1/36]
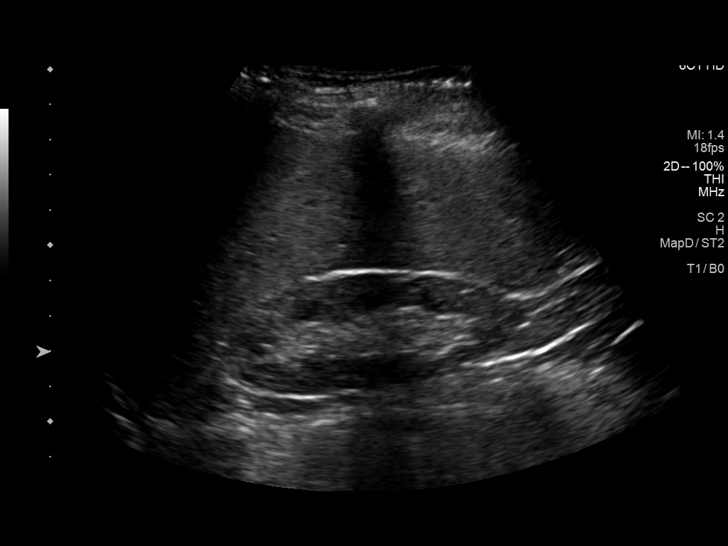
[im 3/36]
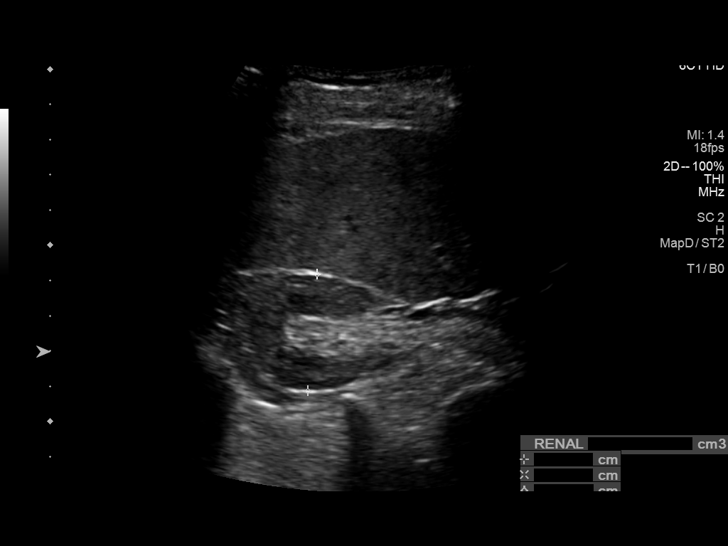
[im 6/36]
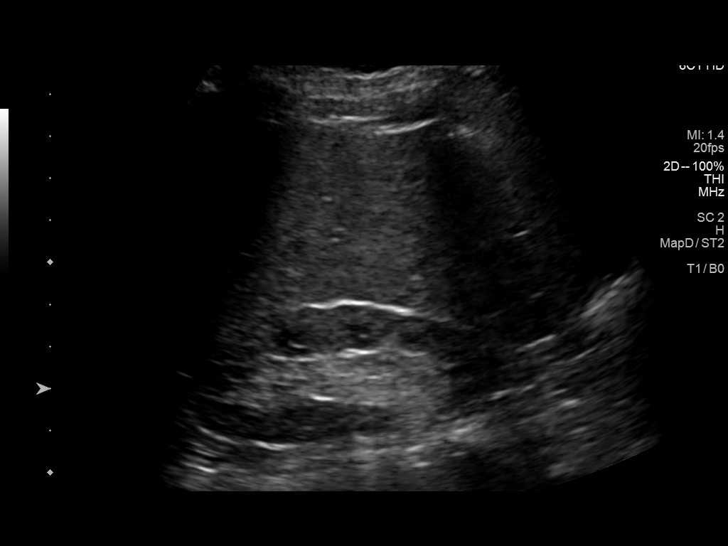
[im 9/36]
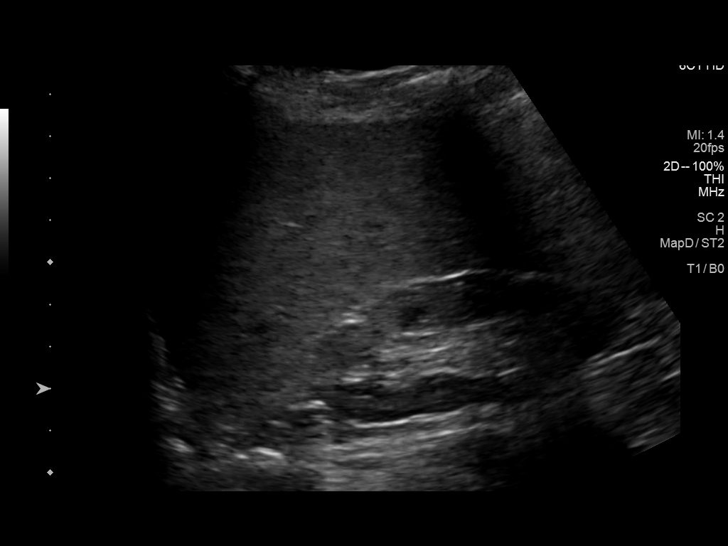
[im 12/36]
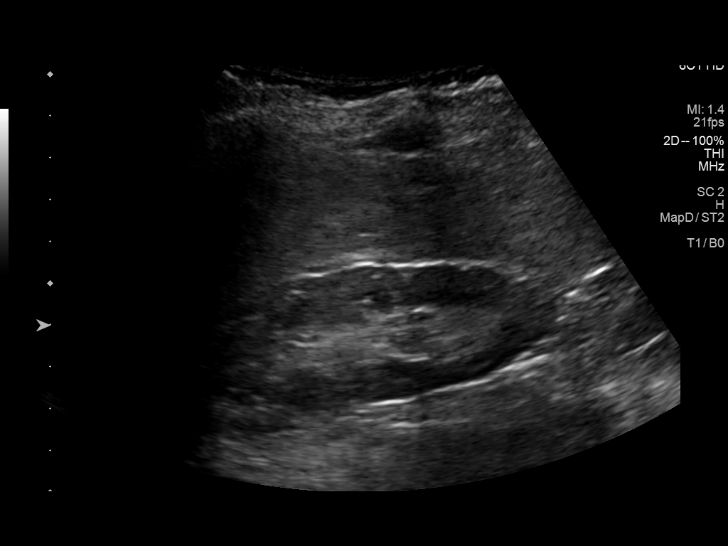
[im 14/36]
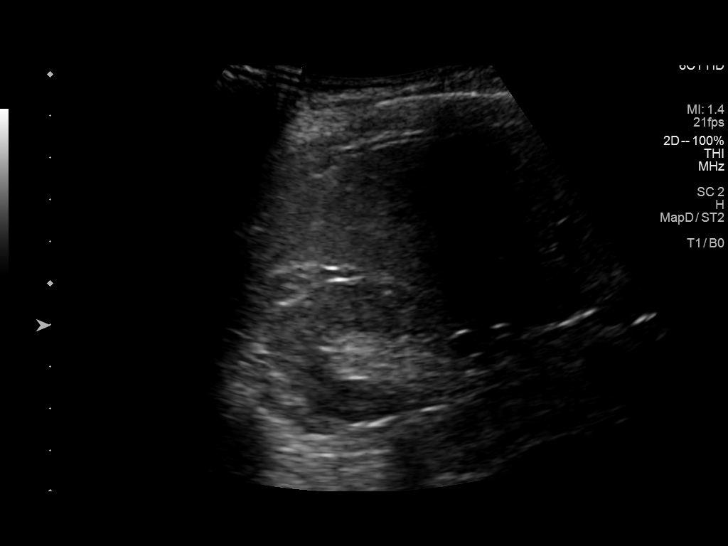
[im 17/36]
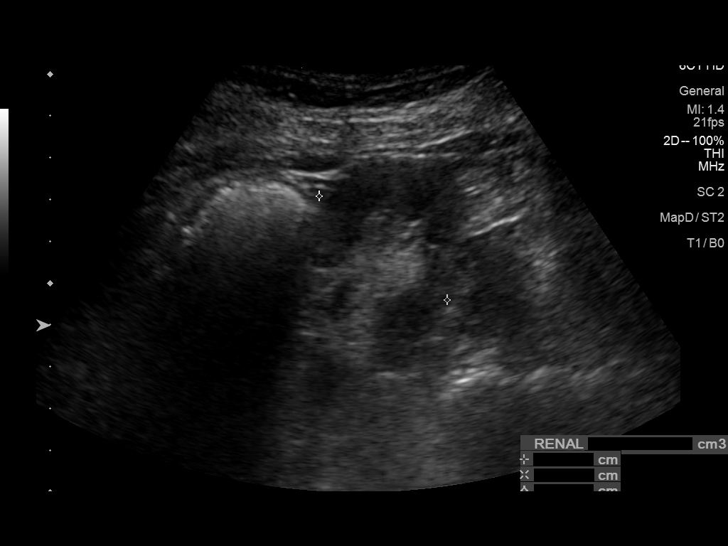
[im 19/36]
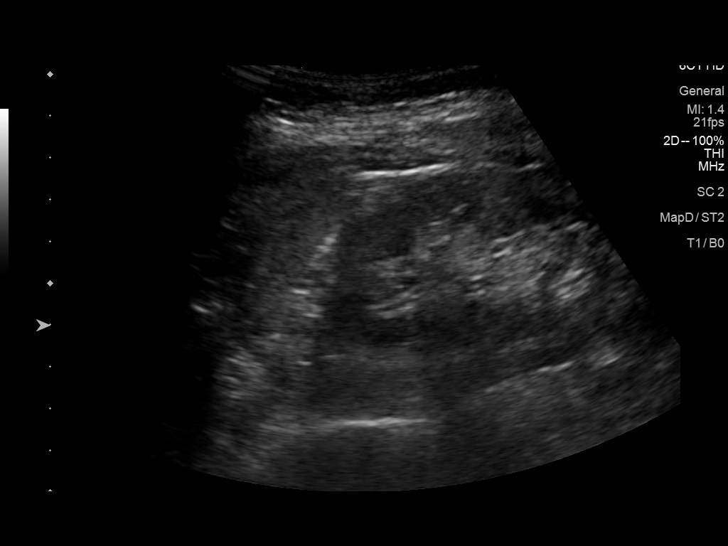
[im 22/36]
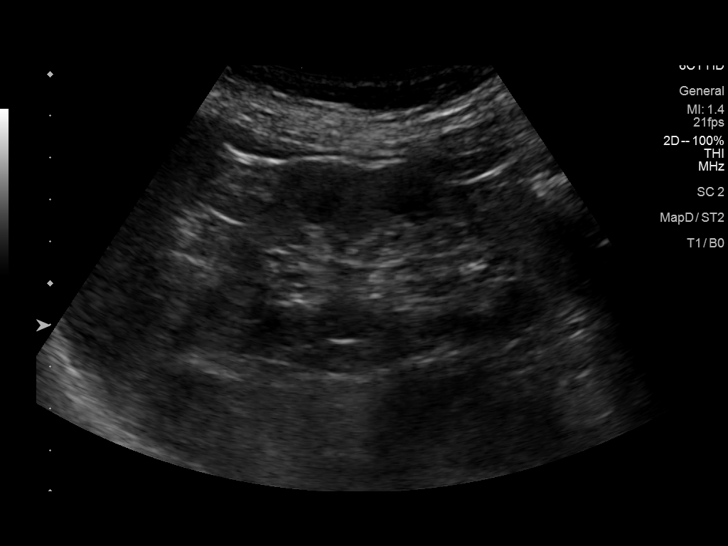
[im 24/36]
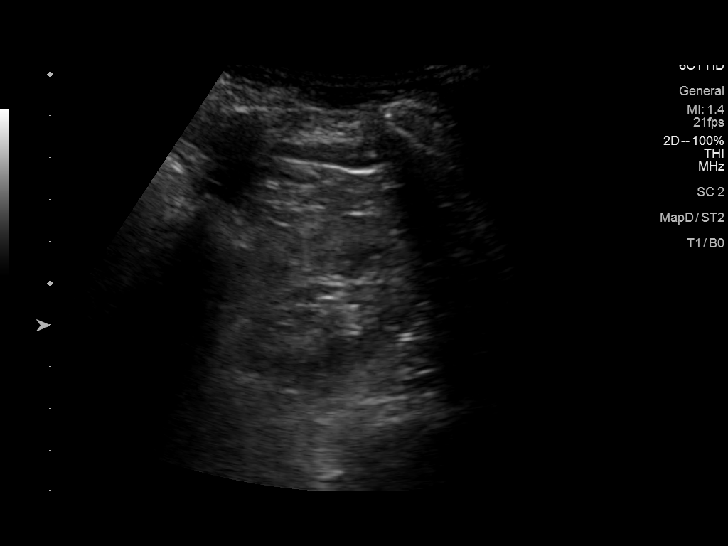
[im 27/36]
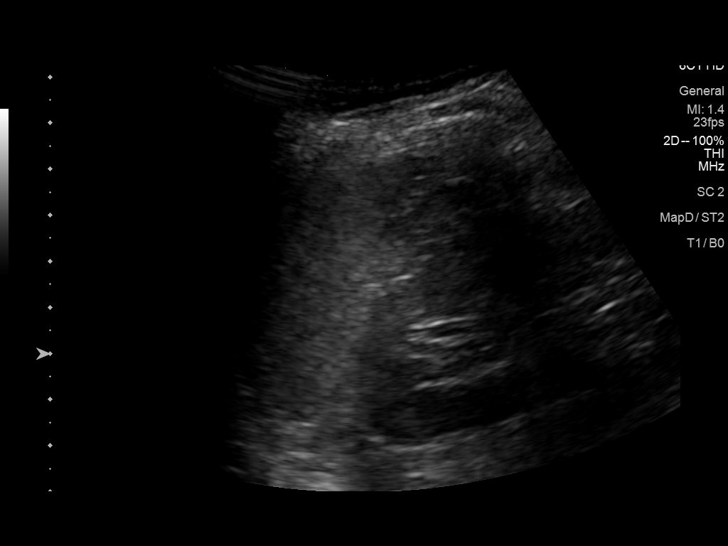
[im 30/36]
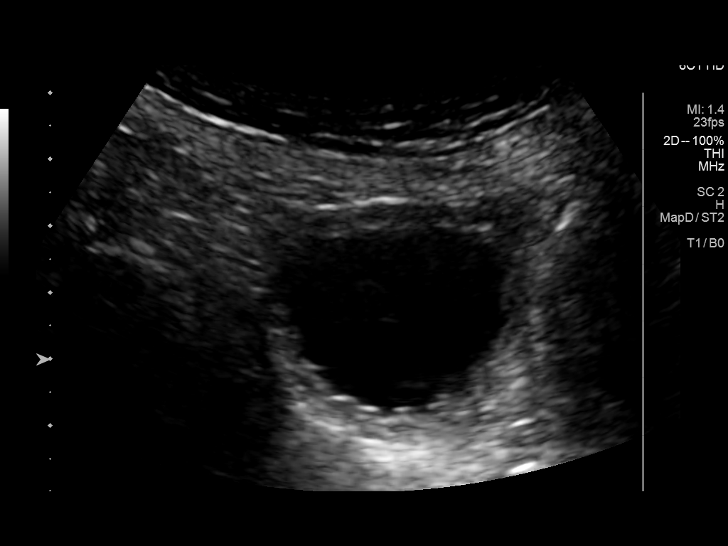
[im 33/36]
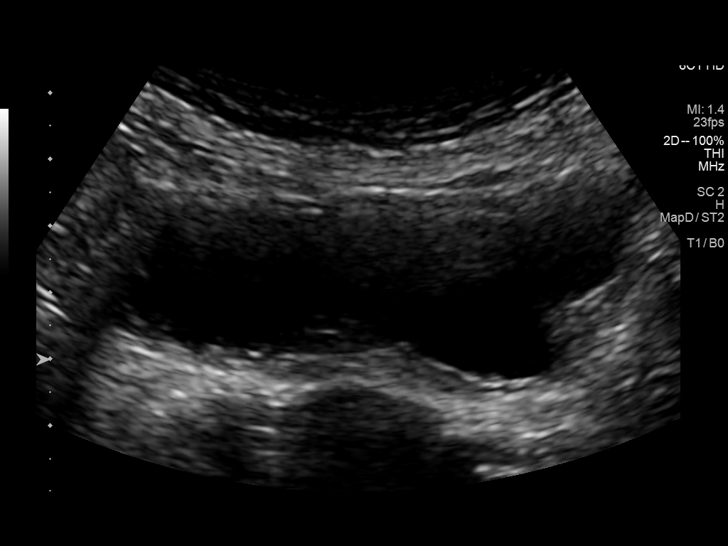
[im 36/36]
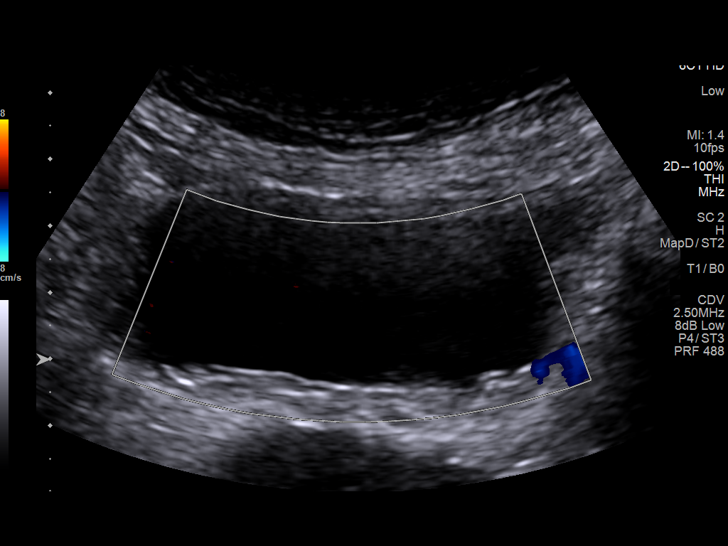

[14 of 25 positions shown; findings below may reference images not displayed]

FINDINGS: Right Kidney:

Renal measurements: 8.7 x 3.4 x 3.3 cm = volume: 51 mL. Echogenicity
within normal limits. No mass or hydronephrosis visualized.

Left Kidney:

Renal measurements: 9.4 x 5.1 x 4 cm = volume: 99 mL. Echogenicity
within normal limits. No mass or hydronephrosis visualized.

Urinary bladder:

Appears normal for degree of bladder distention.

Other:

1.9 cm cystic lesion within the right hepatic lobe.
IMPRESSION: Atrophic kidneys.

## 2022-04-17 ENCOUNTER — Encounter (HOSPITAL_COMMUNITY)
Admission: RE | Admit: 2022-04-17 | Discharge: 2022-04-17 | Disposition: A | Discharge status: Home / Self Care | Payer: Medicare PPO | Source: Ambulatory Visit | Attending: Nephrology | Admitting: Nephrology

## 2022-04-17 VITALS — BP 91/68 | HR 91 | Temp 97.2°F | Resp 18

## 2022-04-17 DIAGNOSIS — N183 Chronic kidney disease, stage 3 unspecified: Secondary | ICD-10-CM | POA: Diagnosis present

## 2022-04-17 LAB — IRON AND TIBC
Iron: 51 ug/dL (ref 28–170)
Saturation Ratios: 15 % (ref 10.4–31.8)
TIBC: 336 ug/dL (ref 250–450)
UIBC: 285 ug/dL

## 2022-04-17 LAB — FERRITIN: Ferritin: 203 ng/mL (ref 11–307)

## 2022-04-17 MED ORDER — EPOETIN ALFA-EPBX 10000 UNIT/ML IJ SOLN
INTRAMUSCULAR | Status: AC
  Filled 2022-04-17: qty 1

## 2022-04-17 MED ORDER — EPOETIN ALFA-EPBX 10000 UNIT/ML IJ SOLN
10000.0000 [IU] | INTRAMUSCULAR | Status: DC
  Administered 2022-04-17: 10000 [IU] via SUBCUTANEOUS

## 2022-04-18 ENCOUNTER — Other Ambulatory Visit: Payer: Self-pay | Admitting: Internal Medicine

## 2022-04-18 DIAGNOSIS — M0579 Rheumatoid arthritis with rheumatoid factor of multiple sites without organ or systems involvement: Secondary | ICD-10-CM

## 2022-04-18 DIAGNOSIS — H16003 Unspecified corneal ulcer, bilateral: Secondary | ICD-10-CM

## 2022-04-20 NOTE — Telephone Encounter (Signed)
Patient needs to schedule follow up she is overdue for this. She has significant  I will refill prescription x1 month but needs to be seen or at least scheduled for additional refills.

## 2022-04-20 NOTE — Telephone Encounter (Signed)
Per Dr. Benjamine Mola please schedule patient for follow-up within the next month.

## 2022-04-20 NOTE — Telephone Encounter (Signed)
Next Visit: Not scheduled   Last Visit: 09/23/2021  Last Fill: 02/09/2022  DX: Rheumatoid arthritis involving multiple sites with positive rheumatoid factor   Current Dose per office note 09/23/2021: Imuran 2 tablets (100 mg total) by mouth daily  Labs: 02/04/2022 CBC WNL except RBC 3.09 LOW, Hemoglobin 29.8 LOW, RDW 16 HIGH CMP WNL except Glucose 100 HIGH, BUN 58 HIGH, Creatinine 3.82 HIGH, Total Protien 6 LOW, Albumin 3.3 LOW, GFR 12 LOW  Okay to refill Imuran?

## 2022-04-23 LAB — POCT HEMOGLOBIN-HEMACUE: Hemoglobin: 11.1 g/dL — ABNORMAL LOW (ref 12.0–15.0)

## 2022-04-27 NOTE — [place] Notes (Deleted)
Office Visit Note  Patient: Tracy Nelson             Date of Birth: 08/18/1945           MRN: [id number]             PCP: [name], [name] Referring: [name], [name] Visit Date: 05/06/2022   Subjective:  No chief complaint on file.   History of Present Illness: Tracy Nelson is a 76 y.o. female here for follow up for seronegative RA complicated with corneal ulcerations    Previous HPI 09/23/2021 [name] is a 76 y.o. female here for follow up for seronegative RA complicated with corneal ulcerations we increased azathioprine dose to 75 mg daily and increased prednisone temporarily. I discussed plan with Dr. Noel Gerold also planning to discontinue hydroxychloroquine over concern about risk and ability to monitor safety. She continues to have eye inflammation worse on the left side. She notices some eye pain and headaches associated with blurry vision. Often around using eye drops or first thing in the morning. She had lab work last month for Dr. Ronelle Nigh with normal blood counts renal function appeared slightly worse.   Previous HPI 08/20/21 Tracy Nelson is a 76 y.o. female here for follow up for seronegative RA on HCQ 200 mg daily and prednisone 5 mg daily. Labs checked at hospital December 2022 with eGFR stable at 29. Joint symptoms are not significantly changed since last visit. However she saw ophthalmology in December with increased eye irritation and redness found to have ulceration in the left eye consistent with active RA. She also continued restasis and artificial tears for severe dryness. She reports seeing some darkening or rash on her face does not have any abnormal sensation.   Previous HPI 01/09/21 Tracy Nelson is a 76 y.o. female here for seronegative rheumatoid arthritis previously a patient with Dr. Steele Berg in [place], [place]. She currently taken hydroxychloroquine 200 mg PO daily and prednisone 5 mg PO daily and has not experienced a severe flare or  increase in steroid dosing since about a year ago.  She also experiences sicca symptoms currently treated with Restasis.  She was previously taking Imuran but is not clear if she is still taking this and how much is contributory to disease control. 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  She has also previously undergone some fibromyalgia tender point injection with significant benefit of back pain symptoms. 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 She has upcoming nephrology appointment next week for her CKD and due to frequent hard sticks prefers if lab draws may be consolidated with frequent bruising and discomfort associated.     No Rheumatology ROS completed.   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   Monoclonal gammopathy 03/10/2021   Hypotension 10/22/2020   Atypical chest pain 10/22/2020   CKD (chronic kidney disease), stage III (HCC) 10/22/2020   Nausea & vomiting 10/22/2020   Physical deconditioning 10/22/2020   Atrial flutter (HCC) 10/18/2020   Arthritis 07/15/2020   GERD (gastroesophageal reflux disease) 07/15/2020   Colitis 07/15/2020   Non-ischemic cardiomyopathy (HCC) 07/15/2020   HFrEF (heart failure with reduced ejection fraction) (HCC) 07/15/2020   HTN (hypertension) 07/15/2020   Osteoporosis 07/15/2020   Palpitations 07/15/2020   Acute gout of left shoulder 07/10/2020   Rheumatoid arthritis involving multiple sites  with positive rheumatoid factor (HCC) 10/28/2015   Mitral regurgitation 05/30/2014   Acute on chronic combined systolic (congestive) and diastolic (congestive) heart failure ([place]) 05/19/2014   Vitamin D deficiency 05/15/2013   Encounter for long-term (current) use of other medications 12/14/2008   Sjogren's syndrome ([facility]) 12/14/2008     Past Medical History:  Diagnosis Date   Acid reflux 07/15/2020   Arthritis 07/15/2020   CHF (congestive heart failure) ([place] [facility])    Colitis 07/15/2020   Dilated cardiomyopathy ([facility]) 07/15/2020   HFrEF (heart failure with reduced ejection fraction) ([facility]) 07/15/2020   HTN (hypertension) 07/15/2020   Osteoporosis 07/15/2020   Palpitations 07/15/2020   Rheumatoid arthritis ([place]) 07/15/2020    Family History  Problem Relation Age of Onset   Hypertension Mother    Diabetes Mother    Hypertension Father    Diabetes Father    Breast cancer Maternal Aunt    Breast cancer Paternal Aunt    Arthritis Maternal Grandmother    Lung disease Paternal Grandfather    Cancer Brother    Past Surgical History:  Procedure Laterality Date   CARDIOVERSION N/A 11/08/2020   Procedure: CARDIOVERSION;  Surgeon: [name], MD;  Location: [facility];  Service: Cardiovascular;  Laterality: N/A;   RIGHT HEART CATH N/A 08/14/2020   Procedure: RIGHT HEART CATH;  Surgeon: [name], MD;  Location: [facility];  Service: Cardiovascular;  Laterality: N/A;   TEE WITHOUT CARDIOVERSION N/A 11/08/2020   Procedure: TRANSESOPHAGEAL ECHOCARDIOGRAM (TEE);  Surgeon: [name], MD;  Location[facility];  Service: Cardiovascular;  Laterality: N/A;   Social History   Social History Narrative   Not on file    There is no immunization history on file for this patient.   Objective: Vital Signs: There were no vitals taken for this visit.   Physical Exam   Musculoskeletal Exam: ***  CDAI Exam: CDAI Score: -- Patient Global: --; Provider Global: -- Swollen: --; Tender: -- Joint Exam 05/06/2022   No joint exam has been documented for this visit   There is currently no information documented on the homunculus. Go to the Rheumatology activity and complete the homunculus joint exam.  Investigation: No additional findings.  Imaging: No results found.  Recent Labs: Lab Results  Component Value  Date   WBC 8.1 02/04/2022   HGB 11.1 (L) 04/17/2022   PLT 253 02/04/2022   NA 135 03/04/2022   K 4.4 03/04/2022   CL 105 03/04/2022   CO2 19 (L) 03/04/2022   GLUCOSE 111 (H) 03/04/2022   BUN 36 (H) 03/04/2022   CREATININE 2.40 (H) 03/04/2022   BILITOT 0.5 02/04/2022   ALKPHOS 48 02/04/2022   AST 28 02/04/2022   ALT 25 02/04/2022   PROT 6.0 (L) 02/04/2022   ALBUMIN 3.3 (L) 02/04/2022   CALCIUM 9.4 03/04/2022    Speciality Comments: No specialty comments available.  Procedures:  No procedures performed Allergies: Baclofen, Penicillin g, and Penicillins   Assessment / Plan:     Visit Diagnoses: No diagnosis found.  ***  Orders: No orders of the defined types were placed in this encounter.  No orders of the defined types were placed in this encounter.    Follow-Up Instructions: No follow-ups on file.   Bertram Savin, RT  Note - This record has been created using Editor, commissioning.  Chart creation errors have been sought, but may not always  have been located. Such creation errors do not reflect on  the standard of  medical care.

## 2022-04-29 ENCOUNTER — Other Ambulatory Visit: Payer: Self-pay | Admitting: Internal Medicine

## 2022-04-29 DIAGNOSIS — H16003 Unspecified corneal ulcer, bilateral: Secondary | ICD-10-CM

## 2022-04-29 DIAGNOSIS — M0579 Rheumatoid arthritis with rheumatoid factor of multiple sites without organ or systems involvement: Secondary | ICD-10-CM

## 2022-04-29 NOTE — Telephone Encounter (Signed)
Next Visit: 05/06/2022  Last Visit: 09/23/2021  Last Fill: 04/20/2022  KJ:[id number] arthritis involving multiple sites with positive rheumatoid factor   Current Dose per office note 09/23/2021: azathioprine to 100 mg daily which is about 2 mg/kg daily  Labs: 03/04/2022 BMP CO2 19, Glucose 111, BUN 36, Creatinine 2.40, GFR, Est 20 02/04/2022 CBC RBC 3.09, Hemoglobin 9.4, HCT 29.8, RDW 16.0  Okay to refill Imuran?

## 2022-04-30 ENCOUNTER — Ambulatory Visit (INDEPENDENT_AMBULATORY_CARE_PROVIDER_SITE_OTHER): Payer: Medicare PPO

## 2022-04-30 VITALS — BP 110/60 | HR 78 | Temp 98.3°F | Ht 61.0 in | Wt 97.2 lb

## 2022-04-30 DIAGNOSIS — Z0000 Encounter for general adult medical examination without abnormal findings: Secondary | ICD-10-CM

## 2022-04-30 NOTE — Patient Instructions (Signed)
Tracy Nelson , Thank you for taking time to come for your Medicare Wellness Visit. I appreciate your ongoing commitment to your health goals. Please review the following plan we discussed and let me know if I can assist you in the future.   Screening recommendations/referrals: Colonoscopy: not required Mammogram: completed 06/03/2021, due 06/04/2022 Bone Density: completed 10/22/2021 Recommended yearly ophthalmology/optometry visit for glaucoma screening and checkup Recommended yearly dental visit for hygiene and checkup  Vaccinations: Influenza vaccine: decline Pneumococcal vaccine: decline Tdap vaccine: decline Shingles vaccine: decline   Covid-19:decline  Advanced directives: Advance directive discussed with you today. Even though you declined this today please call our office should you change your mind and we can give you the proper paperwork for you to fill out.  Conditions/risks identified: none  Next appointment: Follow up in one year for your annual wellness visit    Preventive Care 65 Years and Older, Female Preventive care refers to lifestyle choices and visits with your health care provider that can promote health and wellness. What does preventive care include? A yearly physical exam. This is also called an annual well check. Dental exams once or twice a year. Routine eye exams. Ask your health care provider how often you should have your eyes checked. Personal lifestyle choices, including: Daily care of your teeth and gums. Regular physical activity. Eating a healthy diet. Avoiding tobacco and drug use. Limiting alcohol use. Practicing safe sex. Taking low-dose aspirin every day. Taking vitamin and mineral supplements as recommended by your health care provider. What happens during an annual well check? The services and screenings done by your health care provider during your annual well check will depend on your age, overall health, lifestyle risk factors, and  family history of disease. Counseling  Your health care provider may ask you questions about your: Alcohol use. Tobacco use. Drug use. Emotional well-being. Home and relationship well-being. Sexual activity. Eating habits. History of falls. Memory and ability to understand (cognition). Work and work Statistician. Reproductive health. Screening  You may have the following tests or measurements: Height, weight, and BMI. Blood pressure. Lipid and cholesterol levels. These may be checked every 5 years, or more frequently if you are over 71 years old. Skin check. Lung cancer screening. You may have this screening every year starting at age 52 if you have a 30-pack-year history of smoking and currently smoke or have quit within the past 15 years. Fecal occult blood test (FOBT) of the stool. You may have this test every year starting at age 49. Flexible sigmoidoscopy or colonoscopy. You may have a sigmoidoscopy every 5 years or a colonoscopy every 10 years starting at age 64. Hepatitis C blood test. Hepatitis B blood test. Sexually transmitted disease (STD) testing. Diabetes screening. This is done by checking your blood sugar (glucose) after you have not eaten for a while (fasting). You may have this done every 1-3 years. Bone density scan. This is done to screen for osteoporosis. You may have this done starting at age 48. Mammogram. This may be done every 1-2 years. Talk to your health care provider about how often you should have regular mammograms. Talk with your health care provider about your test results, treatment options, and if necessary, the need for more tests. Vaccines  Your health care provider may recommend certain vaccines, such as: Influenza vaccine. This is recommended every year. Tetanus, diphtheria, and acellular pertussis (Tdap, Td) vaccine. You may need a Td booster every 10 years. Zoster vaccine. You may need this  after age 65. Pneumococcal 13-valent conjugate  (PCV13) vaccine. One dose is recommended after age 68. Pneumococcal polysaccharide (PPSV23) vaccine. One dose is recommended after age 58. Talk to your health care provider about which screenings and vaccines you need and how often you need them. This information is not intended to replace advice given to you by your health care provider. Make sure you discuss any questions you have with your health care provider. Document Released: 08/23/2015 Document Revised: 04/15/2016 Document Reviewed: 05/28/2015 Elsevier Interactive Patient Education  2017 [place] Prevention in the Home Falls can cause injuries. They can happen to people of all ages. There are many things you can do to make your home safe and to help prevent falls. What can I do on the outside of my home? Regularly fix the edges of walkways and driveways and fix any cracks. Remove anything that might make you trip as you walk through a door, such as a raised step or threshold. Trim any bushes or trees on the path to your home. Use bright outdoor lighting. Clear any walking paths of anything that might make someone trip, such as rocks or tools. Regularly check to see if handrails are loose or broken. Make sure that both sides of any steps have handrails. Any raised decks and porches should have guardrails on the edges. Have any leaves, snow, or ice cleared regularly. Use sand or salt on walking paths during winter. Clean up any spills in your garage right away. This includes oil or grease spills. What can I do in the bathroom? Use night lights. Install grab bars by the toilet and in the tub and shower. Do not use towel bars as grab bars. Use non-skid mats or decals in the tub or shower. If you need to sit down in the shower, use a plastic, non-slip stool. Keep the floor dry. Clean up any water that spills on the floor as soon as it happens. Remove soap buildup in the tub or shower regularly. Attach bath mats securely with  double-sided non-slip rug tape. Do not have throw rugs and other things on the floor that can make you trip. What can I do in the bedroom? Use night lights. Make sure that you have a light by your bed that is easy to reach. Do not use any sheets or blankets that are too big for your bed. They should not hang down onto the floor. Have a firm chair that has side arms. You can use this for support while you get dressed. Do not have throw rugs and other things on the floor that can make you trip. What can I do in the kitchen? Clean up any spills right away. Avoid walking on wet floors. Keep items that you use a lot in easy-to-reach places. If you need to reach something above you, use a strong step stool that has a grab bar. Keep electrical cords out of the way. Do not use floor polish or wax that makes floors slippery. If you must use wax, use non-skid floor wax. Do not have throw rugs and other things on the floor that can make you trip. What can I do with my stairs? Do not leave any items on the stairs. Make sure that there are handrails on both sides of the stairs and use them. Fix handrails that are broken or loose. Make sure that handrails are as long as the stairways. Check any carpeting to make sure that it is firmly attached to the  stairs. Fix any carpet that is loose or worn. Avoid having throw rugs at the top or bottom of the stairs. If you do have throw rugs, attach them to the floor with carpet tape. Make sure that you have a light switch at the top of the stairs and the bottom of the stairs. If you do not have them, ask someone to add them for you. What else can I do to help prevent falls? Wear shoes that: Do not have high heels. Have rubber bottoms. Are comfortable and fit you well. Are closed at the toe. Do not wear sandals. If you use a stepladder: Make sure that it is fully opened. Do not climb a closed stepladder. Make sure that both sides of the stepladder are locked  into place. Ask someone to hold it for you, if possible. Clearly mark and make sure that you can see: Any grab bars or handrails. First and last steps. Where the edge of each step is. Use tools that help you move around (mobility aids) if they are needed. These include: Canes. Walkers. Scooters. Crutches. Turn on the lights when you go into a dark area. Replace any light bulbs as soon as they burn out. Set up your furniture so you have a clear path. Avoid moving your furniture around. If any of your floors are uneven, fix them. If there are any pets around you, be aware of where they are. Review your medicines with your doctor. Some medicines can make you feel dizzy. This can increase your chance of falling. Ask your doctor what other things that you can do to help prevent falls. This information is not intended to replace advice given to you by your health care provider. Make sure you discuss any questions you have with your health care provider. Document Released: 05/23/2009 Document Revised: 01/02/2016 Document Reviewed: 08/31/2014 Elsevier Interactive Patient Education  2017 [employer].

## 2022-04-30 NOTE — Progress Notes (Signed)
Subjective:   Tracy Nelson is a 76 y.o. female who presents for Medicare Annual (Subsequent) preventive examination.  Review of Systems     Cardiac Risk Factors include: advanced age (>51mn, >>11women);hypertension     Objective:    Today's Vitals   04/30/22 1129  BP: 110/60  Pulse: 78  Temp: 98.3 F (36.8 C)  TempSrc: Oral  SpO2: 99%  Weight: 97 lb 3.2 oz (44.1 kg)  Height: '5\' 1"'$  (1.549 m)   Body mass index is 18.37 kg/m.     04/30/2022   11:42 AM 11/01/2021    7:31 PM 04/10/2021   10:14 AM 03/10/2021    3:21 PM 11/08/2020    7:05 AM 10/19/2020    2:10 PM 08/29/2020    3:30 PM  Advanced Directives  Does Patient Have a Medical Advance Directive? Yes No Yes Yes Yes Yes No  Type of AP[place]amed[place]of [place] will  Healthcare Power of Attorney Living will;Healthcare Power of [place]  Does patient want to make changes to medical advance directive?      No - Patient declined   Copy of [place] Chart? No - copy requested  No - copy requested  No - copy requested No - copy requested   Would patient like information on creating a medical advance directive?  No - Patient declined         Current Medications (verified) Outpatient Encounter Medications as of 04/30/2022  Medication Sig   amiodarone (PACERONE) 200 MG tablet TAKE 1 TABLET BY MOUTH EVERY DAY   azaTHIOprine (IMURAN) 50 MG tablet TAKE 2 TABLETS BY MOUTH EVERY DAY   bumetanide (BUMEX) 2 MG tablet Take 1 tablet (2 mg total) by mouth daily.   Cholecalciferol (VITAMIN D-3 PO) Take 25 mcg by mouth 3 (three) times a week.   cholestyramine (QUESTRAN) 4 g packet Take 1 packet (4 g total) by mouth daily.   cyclobenzaprine (FLEXERIL) 10 MG tablet Take 1 tablet (10 mg total) by mouth 3 (three) times daily as needed for muscle spasms.   ELIQUIS 2.5 MG TABS tablet TAKE 1 TABLET BY MOUTH TWICE A DAY   Ensure (ENSURE) Take  237 mLs by mouth 2 (two) times daily between meals.   folic acid (FOLVITE) 1 MG tablet TAKE 1 TABLET BY MOUTH EVERY DAY   hydroxychloroquine (PLAQUENIL) 200 MG tablet TAKE 1 TABLET BY MOUTH EVERY DAY   hydroxypropyl methylcellulose / hypromellose (ISOPTO TEARS / GONIOVISC) 2.5 % ophthalmic solution Place 1 drop into both eyes as needed for dry eyes.   lidocaine (LIDODERM) 5 % Place 1 patch onto the skin daily. Remove & Discard patch within 12 hours or as directed by MD   Menthol, Topical Analgesic, (ICY HOT BACK EX) Apply 1 application topically daily as needed (pain).   midodrine (PROAMATINE) 5 MG tablet Take 1 tablet (5 mg total) by mouth 3 (three) times daily with meals.   MITIGARE 0.6 MG CAPS Take 0.6 mg by mouth daily as needed (gout).   Multiple Vitamin (MULTIVITAMIN) tablet Take 1 tablet by mouth daily.   omeprazole (PRILOSEC) 40 MG capsule Take 40 mg by mouth daily.   polyethylene glycol (MIRALAX / GLYCOLAX) 17 g packet Take 17 g by mouth as needed.   predniSONE (DELTASONE) 5 MG tablet TAKE 1 TABLET BY MOUTH EVERY DAY WITH BREAKFAST   Vericiguat (VERQUVO) 10 MG TABS Take 10 mg by mouth  daily.   gabapentin (NEURONTIN) 100 MG capsule Take 100 mg by mouth as needed. (Patient not taking: Reported on 04/30/2022)   meclizine (ANTIVERT) 12.5 MG tablet Take 1 tablet (12.5 mg total) by mouth 3 (three) times daily as needed for dizziness. (Patient not taking: Reported on 03/25/2022)   traMADol (ULTRAM) 50 MG tablet Take 1 tablet (50 mg total) by mouth every 12 (twelve) hours as needed. (Patient not taking: Reported on 03/25/2022)   No facility-administered encounter medications on file as of 04/30/2022.    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   HFrEF (heart failure with  reduced ejection fraction) ([place]) 07/15/2020   HTN (hypertension) 07/15/2020   Osteoporosis 07/15/2020   Palpitations 07/15/2020   Rheumatoid arthritis ([place]) 07/15/2020   Past Surgical History:  Procedure Laterality Date   CARDIOVERSION N/A 11/08/2020   Procedure: CARDIOVERSION;  Surgeon: [name], MD;  Location: [facility];  Service: Cardiovascular;  Laterality: N/A;   RIGHT HEART CATH N/A 08/14/2020   Procedure: RIGHT HEART CATH;  Surgeon: [name], MD;  Location: [facility];  Service: Cardiovascular;  Laterality: N/A;   TEE WITHOUT CARDIOVERSION N/A 11/08/2020   Procedure: TRANSESOPHAGEAL ECHOCARDIOGRAM (TEE);  Surgeon: [name], MD;  [facility];  Service: Cardiovascular;  Laterality: N/A;   Family History  Problem Relation Age of Onset   Hypertension Mother    Diabetes Mother    Hypertension Father    Diabetes Father    Breast cancer Maternal Aunt    Breast cancer Paternal Aunt    Arthritis Maternal Grandmother    Lung disease Paternal Grandfather    Cancer Brother    Social History   Socioeconomic History   Marital status: Widowed    Spouse name: Not on file   Number of children: Not on file   Years of education: 16   Highest education level: Bachelor's degree (e.g., BA, AB, BS)  Occupational History   Occupation: Retired  Tobacco Use   Smoking status: Never   Smokeless tobacco: Never  Vaping Use   Vaping Use: Never used  Substance and Sexual Activity   Alcohol use: Never   Drug use: Never   Sexual activity: Not Currently  Other Topics Concern   Not on file  Social History Narrative   Not on file   Social Determinants of Health   Financial Resource Strain: Low Risk  (04/30/2022)   Overall Financial Resource Strain (CARDIA)    Difficulty of Paying Living Expenses: Not hard at all  Food Insecurity: No Food Insecurity (04/30/2022)   Hunger Vital Sign    Worried About Running Out of Food in the Last Year: Never true    Ran  Out of Food in the Last Year: Never true  Transportation Needs: No Transportation Needs (04/30/2022)   PRAPARE - Hydrologist (Medical): No    Lack of Transportation (Non-Medical): No  Physical Activity: Inactive (04/30/2022)   Exercise Vital Sign    Days of Exercise per Week: 0 days    Minutes of Exercise per Session: 0 min  Stress: No Stress Concern Present (04/30/2022)   [place]    Feeling of Stress : Not at all  Social Connections: Not  on file    Tobacco Counseling Counseling given: Not Answered   Clinical Intake:  Pre-visit preparation completed: Yes  Pain : No/denies pain     Nutritional Status: BMI <19  Underweight Nutritional Risks: Nausea/ vomitting/ diarrhea (chronic nausea) Diabetes: No  How often do you need to have someone help you when you read instructions, pamphlets, or other written materials from your doctor or pharmacy?: 1 - Never  Diabetic? no  Interpreter Needed?: No  Information entered by :: NAllen LPN   Activities of Daily Living    04/30/2022   11:43 AM  In your present state of health, do you have any difficulty performing the following activities:  Hearing? 0  Vision? 1  Comment blurriness sometimes  Difficulty concentrating or making decisions? 0  Walking or climbing stairs? 1  Dressing or bathing? 0  Doing errands, shopping? 1  Preparing Food and eating ? N  Using the Toilet? N  In the past six months, have you accidently leaked urine? N  Do you have problems with loss of bowel control? N  Managing your Medications? N  Managing your Finances? N  Housekeeping or managing your Housekeeping? Y    Patient Care Team: [name], [name] as PCP - General ([place]) Jorge Ny, LCSW as Social Worker (Licensed Clinical Social Worker) Dianah Field, Sharyn Blitz, [facility] (Pharmacist)  Indicate any recent Medical Services you may have received  from other than [facility] providers in the past year (date may be approximate).     Assessment:   This is a routine wellness examination for [place].  Hearing/Vision screen Vision Screening - Comments:: Regular eye exams, [facility]  Dietary issues and exercise activities discussed: Current Exercise Habits: The patient does not participate in regular exercise at present   Goals Addressed             This Visit's Progress    Patient Stated       04/30/2022, maintain       Depression Screen    04/30/2022   11:43 AM 03/09/2022   11:13 AM 04/10/2021   10:15 AM 12/06/2020    8:44 AM 11/14/2020    8:46 AM  PHQ 2/9 Scores  PHQ - 2 Score 0 0 0 0 0  PHQ- 9 Score     0    Fall Risk    04/30/2022   11:42 AM 03/09/2022   11:13 AM 04/10/2021   10:15 AM 12/05/2020    3:47 PM  [place] in the past year? 0 0 0 0  Number falls in past yr: 0 0  0  Injury with Fall? 0 0  0  Risk for fall due to : Impaired balance/gait;Impaired mobility;Medication side effect  Medication side effect Impaired balance/gait  Follow up Falls prevention discussed;Education provided;Falls evaluation completed Falls evaluation completed Falls evaluation completed;Education provided;Falls prevention discussed Falls evaluation completed    FALL RISK PREVENTION PERTAINING TO THE HOME:  Any stairs in or around the home? Yes  If so, are there any without handrails? No  Home free of loose throw rugs in walkways, pet beds, electrical cords, etc? Yes  Adequate lighting in your home to reduce risk of falls? Yes   ASSISTIVE DEVICES UTILIZED TO PREVENT FALLS:  Life alert? Yes  Use of a cane, walker or w/c? No  Grab bars in the bathroom? No  Shower chair or bench in shower? Yes  Elevated toilet seat or a handicapped toilet? Yes   TIMED  UP AND GO:  Was the test performed? Yes .  Length of time to ambulate 10 feet: 7 sec.   Gait slow and steady without use of assistive device  Cognitive Function:         04/30/2022   11:45 AM 04/10/2021   10:18 AM  6CIT Screen  What Year? 0 points 0 points  What month? 0 points 0 points  What time? 0 points 0 points  Count back from 20 0 points 0 points  Months in reverse 0 points 0 points  Repeat phrase 4 points 2 points  Total Score 4 points 2 points    Immunizations  There is no immunization history on file for this patient.  TDAP status: Due, Education has been provided regarding the importance of this vaccine. Advised may receive this vaccine at local pharmacy or Health Dept. Aware to provide a copy of the vaccination record if obtained from local pharmacy or Health Dept. Verbalized acceptance and understanding.  Flu Vaccine status: Declined, Education has been provided regarding the importance of this vaccine but patient still declined. Advised may receive this vaccine at local pharmacy or Health Dept. Aware to provide a copy of the vaccination record if obtained from local pharmacy or Health Dept. Verbalized acceptance and understanding.  Pneumococcal vaccine status: Declined,  Education has been provided regarding the importance of this vaccine but patient still declined. Advised may receive this vaccine at local pharmacy or Health Dept. Aware to provide a copy of the vaccination record if obtained from local pharmacy or Health Dept. Verbalized acceptance and understanding.   Covid-19 vaccine status: Declined, Education has been provided regarding the importance of this vaccine but patient still declined. Advised may receive this vaccine at local pharmacy or Health Dept.or vaccine clinic. Aware to provide a copy of the vaccination record if obtained from local pharmacy or Health Dept. Verbalized acceptance and understanding.  Qualifies for Shingles Vaccine? Yes   Zostavax completed No   Shingrix Completed?: No.    Education has been provided regarding the importance of this vaccine. Patient has been advised to call insurance company to determine out  of pocket expense if they have not yet received this vaccine. Advised may also receive vaccine at local pharmacy or Health Dept. Verbalized acceptance and understanding.  Screening Tests Health Maintenance  Topic Date Due   COVID-19 Vaccine (1) 05/16/2022 (Originally 08/10/1950)   Pneumonia Vaccine 80+ Years old (1 - PCV) 06/04/2022 (Originally 08/10/2010)   Zoster Vaccines- Shingrix (1 of 2) 06/09/2022 (Originally 08/10/1964)   INFLUENZA VACCINE  11/08/2022 (Originally 03/10/2022)   TETANUS/TDAP  05/01/2023 (Originally 08/10/1964)   DEXA SCAN  Completed   Hepatitis C Screening  Completed   HPV VACCINES  Aged Out    Health Maintenance  There are no preventive care reminders to display for this patient.   Colorectal cancer screening: No longer required.   Mammogram status: Completed 06/03/2021. Repeat every year  Bone Density status: Completed 10/22/2021.   Lung Cancer Screening: (Low Dose CT Chest recommended if Age 41-80 years, 30 pack-year currently smoking OR have quit w/in 15years.) does not qualify.   Lung Cancer Screening Referral: no  Additional Screening:  Hepatitis C Screening: does qualify; Completed 02/13/2021  Vision Screening: Recommended annual ophthalmology exams for early detection of glaucoma and other disorders of the eye. Is the patient up to date with their annual eye exam?  Yes  Who is the provider or what is the name of the office in which the patient  attends annual eye exams? Dr. Katy Fitch If pt is not established with a provider, would they like to be referred to a provider to establish care? No .   Dental Screening: Recommended annual dental exams for proper oral hygiene  Community Resource Referral / Chronic Care Management: CRR required this visit?  No   CCM required this visit?  No      Plan:     I have personally reviewed and noted the following in the patient's chart:   Medical and social history Use of alcohol, tobacco or illicit drugs  Current  medications and supplements including opioid prescriptions. Patient is not currently taking opioid prescriptions. Functional ability and status Nutritional status Physical activity Advanced directives List of other physicians Hospitalizations, surgeries, and ER visits in previous 12 months Vitals Screenings to include cognitive, depression, and falls Referrals and appointments  In addition, I have reviewed and discussed with patient certain preventive protocols, quality metrics, and best practice recommendations. A written personalized care plan for preventive services as well as general preventive health recommendations were provided to patient.     Kellie Simmering, LPN   09/11/3341   Nurse Notes: none

## 2022-05-01 ENCOUNTER — Encounter (HOSPITAL_COMMUNITY)
Admission: RE | Admit: 2022-05-01 | Discharge: 2022-05-01 | Disposition: A | Discharge status: Home / Self Care | Payer: Medicare PPO | Source: Ambulatory Visit | Attending: Nephrology | Admitting: Nephrology

## 2022-05-01 VITALS — BP 102/73 | HR 81 | Temp 97.1°F

## 2022-05-01 DIAGNOSIS — N183 Chronic kidney disease, stage 3 unspecified: Secondary | ICD-10-CM | POA: Diagnosis not present

## 2022-05-01 LAB — POCT HEMOGLOBIN-HEMACUE: Hemoglobin: 10.5 g/dL — ABNORMAL LOW (ref 12.0–15.0)

## 2022-05-01 MED ORDER — EPOETIN ALFA-EPBX 10000 UNIT/ML IJ SOLN
10000.0000 [IU] | INTRAMUSCULAR | Status: DC
  Administered 2022-05-01: 10000 [IU] via SUBCUTANEOUS

## 2022-05-01 MED ORDER — EPOETIN ALFA-EPBX 10000 UNIT/ML IJ SOLN
INTRAMUSCULAR | Status: AC
  Filled 2022-05-01: qty 1

## 2022-05-02 ENCOUNTER — Other Ambulatory Visit: Payer: Self-pay | Admitting: Internal Medicine

## 2022-05-02 DIAGNOSIS — M0579 Rheumatoid arthritis with rheumatoid factor of multiple sites without organ or systems involvement: Secondary | ICD-10-CM

## 2022-05-02 DIAGNOSIS — H16003 Unspecified corneal ulcer, bilateral: Secondary | ICD-10-CM

## 2022-05-04 NOTE — Telephone Encounter (Signed)
Next Visit: 05/06/2022  Last Visit: 09/23/2021  Last Fill: 02/09/2022  Dx:  Rheumatoid arthritis involving multiple sites with positive rheumatoid factor   Current Dose per office note on 09/23/2021: prednisone 5 mg daily  Okay to refill prednisone?

## 2022-05-06 ENCOUNTER — Ambulatory Visit: Payer: Medicare PPO | Attending: Internal Medicine | Admitting: Internal Medicine

## 2022-05-06 DIAGNOSIS — M0579 Rheumatoid arthritis with rheumatoid factor of multiple sites without organ or systems involvement: Secondary | ICD-10-CM

## 2022-05-06 DIAGNOSIS — N183 Chronic kidney disease, stage 3 unspecified: Secondary | ICD-10-CM

## 2022-05-06 DIAGNOSIS — H16003 Unspecified corneal ulcer, bilateral: Secondary | ICD-10-CM

## 2022-05-08 ENCOUNTER — Telehealth: Payer: Self-pay

## 2022-05-08 NOTE — Chronic Care Management (AMB) (Signed)
    Called Tracy Nelson, No answer, left message of appointment on 05-12-2022 at 9:00 via telephone visit with [name], Pharm D. Notified to have all medications, supplements, blood pressure and/or blood sugar logs available during appointment and to return call if need to reschedule.    Care Gaps: AWV 05-13-2023  Star Rating Drug: None  Any gaps in medications fill history? No  [place] Pharmacist Assistant [phone]

## 2022-05-12 ENCOUNTER — Telehealth: Payer: Self-pay

## 2022-05-12 ENCOUNTER — Telehealth: Payer: Medicare PPO

## 2022-05-12 ENCOUNTER — Other Ambulatory Visit: Payer: Self-pay

## 2022-05-12 NOTE — Telephone Encounter (Signed)
Spoke with patient. She reports she cant hold anything on her stomach started 3 days ago. She tries to eat and vomits. She is talking really low. Patient does not want an apt right now she wants to wait it out. I gave her the ext. 220 in case her condition worsens. Patient notified, patient aware.

## 2022-05-12 NOTE — Progress Notes (Deleted)
Current Barriers:  {pharmacybarriers:24917}  Pharmacist Clinical Goal(s):  Patient will {PHARMACYGOALCHOICES:24921} through collaboration with PharmD and provider.   Interventions: 1:1 collaboration with [name], [name] regarding development and update of comprehensive plan of care as evidenced by provider attestation and co-signature Inter-disciplinary care team collaboration (see longitudinal plan of care) Comprehensive medication review performed; medication list updated in electronic medical record  {CCM [facility] DISEASE STATES:25130}  Patient Goals/Self-Care Activities Patient will:  - {pharmacypatientgoals:24919}  Follow Up Plan: {CM FOLLOW UP NOBS:96283}  Chronic Care Management Pharmacy Note  05/12/2022 Name:  Tracy Nelson MRN:  [id number] DOB:  Dec 30, 1945  Summary: ***  Recommendations/Changes made from today's visit: ***  Plan: ***   Subjective: Tracy Nelson is an 76 y.o. year old female who is a primary patient of [name], [place].  The CCM team was consulted for assistance with disease management and care coordination needs.    {CCMTELEPHONEFACETOFACE:21091510} for {CCMINITIALFOLLOWUPCHOICE:21091511} in response to provider referral for pharmacy case management and/or care coordination services.   Consent to Services:  {CCMCONSENTOPTIONS:25074}  Patient Care Team: [name], [name] as PCP - General (General Practice) Jorge Ny, LCSW as Social Worker (Licensed Clinical Social Worker) Dianah Field, Sharyn Blitz, [facility] (Pharmacist)  Recent office visits: ***  Recent consult visits: [facility] visits: {Hospital DC Yes/No:25215}   Objective:  Lab Results  Component Value Date   CREATININE 2.40 (H) 03/04/2022   BUN 36 (H) 03/04/2022   EGFR 26 (L) 06/04/2021   GFRNONAA 20 (L) 03/04/2022   NA 135 03/04/2022   K 4.4 03/04/2022   CALCIUM 9.4 03/04/2022   CO2 19 (L) 03/04/2022   GLUCOSE 111 (H) 03/04/2022    Lab Results  Component Value  Date/Time   HGBA1C 5.3 03/09/2022 02:34 PM   HGBA1C 6.4 (H) 09/04/2021 12:42 PM    Last diabetic Eye exam:  Lab Results  Component Value Date/Time   HMDIABEYEEXA No Retinopathy 10/21/2021 03:05 PM    Last diabetic Foot exam: No results found for: "HMDIABFOOTEX"   Lab Results  Component Value Date   CHOL 205 (H) 03/09/2022   HDL 91 03/09/2022   LDLCALC 95 03/09/2022   TRIG 111 03/09/2022   CHOLHDL 2.3 03/09/2022       Latest Ref Rng & Units 02/04/2022    3:29 PM 11/06/2021   12:22 PM 09/03/2021   12:25 PM  Hepatic Function  Total Protein 6.5 - 8.1 g/dL 6.0  7.0  6.2   Albumin 3.5 - 5.0 g/dL 3.3  3.5  3.0   AST 15 - 41 U/L 28  42  35   ALT 0 - 44 U/L 25  48  35   Alk Phosphatase 38 - 126 U/L 48  73  88   Total Bilirubin 0.3 - 1.2 mg/dL 0.5  0.4  0.6     Lab Results  Component Value Date/Time   TSH 1.585 02/04/2022 03:29 PM   TSH 1.709 09/03/2021 12:25 PM       Latest Ref Rng & Units 05/01/2022    8:12 AM 04/17/2022    8:07 AM 03/20/2022   10:06 AM  CBC  Hemoglobin 12.0 - 15.0 g/dL 10.5  11.1  9.2     No results found for: "VD25OH"  Clinical ASCVD: {YES/NO:21197} The 10-year ASCVD risk score (Arnett DK, et al., 2019) is: 15%   Values used to calculate the score:     Age: 76 years     Sex: Female     Is Non-Hispanic  African American: Yes     Diabetic: No     Tobacco smoker: No     Systolic Blood Pressure: 016 mmHg     Is BP treated: Yes     HDL Cholesterol: 91 mg/dL     Total Cholesterol: 205 mg/dL       04/30/2022   11:43 AM 03/09/2022   11:13 AM 04/10/2021   10:15 AM  Depression screen PHQ 2/9  Decreased Interest 0 0 0  Down, Depressed, Hopeless 0 0 0  PHQ - 2 Score 0 0 0     ***Other: (CHADS2VASc if Afib, MMRC or CAT for COPD, ACT, DEXA)  Social History   Tobacco Use  Smoking Status Never  Smokeless Tobacco Never   BP Readings from Last 3 Encounters:  05/01/22 102/73  04/30/22 110/60  04/17/22 91/68   Pulse Readings from Last 3 Encounters:   05/01/22 81  04/30/22 78  04/17/22 91   Wt Readings from Last 3 Encounters:  04/30/22 97 lb 3.2 oz (44.1 kg)  03/09/22 102 lb (46.3 kg)  03/04/22 100 lb 9.6 oz (45.6 kg)   BMI Readings from Last 3 Encounters:  04/30/22 18.37 kg/m  03/09/22 18.66 kg/m  03/04/22 18.40 kg/m    Assessment/Interventions: Review of patient past medical history, allergies, medications, health status, including review of consultants reports, laboratory and other test data, was performed as part of comprehensive evaluation and provision of chronic care management services.   SDOH:  (Social Determinants of Health) assessments and interventions performed: {yes/no:20286} SDOH Interventions    Flowsheet Row Clinical Support from 04/30/2022 in [place] Telephone from 11/05/2021 in [place] Management from 05/15/2021 in [facility] Telephone from 11/01/2020 in [place] ED to Hosp-Admission (Discharged) from 10/18/2020 in [facility]  SDOH Interventions       Food Insecurity Interventions Intervention Not Indicated -- -- -- Intervention Not Indicated  Housing Interventions -- -- -- -- Intervention Not Indicated  Transportation Interventions Intervention Not Indicated Taxi Voucher Given -- Oceanographer Strain Interventions Intervention Not Indicated -- --  Aletha Halim send referral for food assistance] -- Other (Comment)  [referring to medication assistance programs]  Physical Activity Interventions Patient Refused, Other (Comments) -- -- -- --  Stress Interventions Intervention Not Indicated -- -- -- --      SDOH Screenings   Food Insecurity: No Food Insecurity (04/30/2022)  Housing: Low Risk  (10/22/2020)  Transportation Needs: No Transportation Needs (04/30/2022)  Depression (PHQ2-9): Low Risk  (04/30/2022)  Financial Resource  Strain: Low Risk  (04/30/2022)  Physical Activity: Inactive (04/30/2022)  Stress: No Stress Concern Present (04/30/2022)  Tobacco Use: Low Risk  (04/30/2022)    [place]  Allergies  Allergen Reactions   Baclofen Nausea And Vomiting   Penicillin G     Other reaction(s): Unknown   Penicillins Rash    Medications Reviewed Today     Reviewed by Domingo Madeira, RN (Registered Nurse) on 05/01/22 at [place] Status: <None>   Medication Order Taking? Sig Documenting Provider Last Dose Status Informant  amiodarone (PACERONE) 200 MG tablet [id number]  TAKE 1 TABLET BY MOUTH EVERY DAY [name], MD  Active   azaTHIOprine (IMURAN) 50 MG tablet [id number]  TAKE 2 TABLETS BY MOUTH EVERY DAY Rice, Resa Miner, MD  Active   bumetanide (BUMEX) 2 MG tablet [id number]  Take 1  tablet (2 mg total) by mouth daily. [name], MD  Active   Cholecalciferol (VITAMIN D-3 PO) [id number]  Take 25 mcg by mouth 3 (three) times a week. [provider]  Active   cholestyramine (QUESTRAN) 4 g packet [id number]  Take 1 packet (4 g total) by mouth daily. [name], MD  Active Self  cyclobenzaprine (FLEXERIL) 10 MG tablet [id number]  Take 1 tablet (10 mg total) by mouth 3 (three) times daily as needed for muscle spasms. [name], [name]  Active   ELIQUIS 2.5 MG TABS tablet [id number]  TAKE 1 TABLET BY MOUTH TWICE A DAY [name], MD  Active   Ensure [facility]) [id number]  Take 237 mLs by mouth 2 (two) times daily between meals. [provider]  Active Self  folic acid (FOLVITE) 1 MG tablet [id number]  TAKE 1 TABLET BY MOUTH EVERY DAY [name], MD  Active   gabapentin (NEURONTIN) 100 MG capsule [id number]  Take 100 mg by mouth as needed.  Patient not taking: Reported on 04/30/2022   [provider]  Active   hydroxychloroquine (PLAQUENIL) 200 MG tablet [id number]  TAKE 1 TABLET BY MOUTH EVERY DAY [name], MD  Active   hydroxypropyl methylcellulose  / hypromellose (ISOPTO TEARS / GONIOVISC) 2.5 % ophthalmic solution [id number]  Place 1 drop into both eyes as needed for dry eyes. [provider]  Active   lidocaine (LIDODERM) 5 % [id number]  Place 1 patch onto the skin daily. Remove & Discard patch within 12 hours or as directed by MD [name], [name]  Active   meclizine (ANTIVERT) 12.5 MG tablet [id number]  Take 1 tablet (12.5 mg total) by mouth 3 (three) times daily as needed for dizziness.  Patient not taking: Reported on 03/25/2022   [name], [name]  Active   Menthol, Topical Analgesic, (ICY HOT BACK EX) [id number]  Apply 1 application topically daily as needed (pain). [provider]  Active Self  midodrine (PROAMATINE) 5 MG tablet [id number]  Take 1 tablet (5 mg total) by mouth 3 (three) times daily with meals. [name], MD  Active   MITIGARE 0.6 MG CAPS [id number]  Take 0.6 mg by mouth daily as needed (gout). [provider]  Active Self  Multiple Vitamin (MULTIVITAMIN) tablet [id number]  Take 1 tablet by mouth daily. [provider]  Active Self  omeprazole (PRILOSEC) 40 MG capsule [id number]  Take 40 mg by mouth daily. [provider]  Active Self  polyethylene glycol (MIRALAX / GLYCOLAX) 17 g packet [id number]  Take 17 g by mouth as needed. [provider]  Active Self  predniSONE (DELTASONE) 5 MG tablet [id number]  TAKE 1 TABLET BY MOUTH EVERY DAY WITH BREAKFAST Rice, Resa Miner, MD  Active   traMADol (ULTRAM) 50 MG tablet [id number]  Take 1 tablet (50 mg total) by mouth every 12 (twelve) hours as needed.  Patient not taking: Reported on 03/25/2022   Couture, Cortni S, PA-C  Active   Vericiguat (VERQUVO) 10 MG TABS [id number]  Take 10 mg by mouth daily. 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   Monoclonal gammopathy 03/10/2021   Hypotension 10/22/2020   Atypical chest pain  10/22/2020   CKD (chronic kidney disease), stage III ([place]) 10/22/2020   Nausea & vomiting 10/22/2020   Physical deconditioning 10/22/2020  Atrial flutter ([place]) 10/18/2020   Arthritis 07/15/2020   GERD (gastroesophageal reflux disease) 07/15/2020   Colitis 07/15/2020   Non-ischemic cardiomyopathy ([place]) 07/15/2020   HFrEF (heart failure with reduced ejection fraction) ([place]) 07/15/2020   HTN (hypertension) 07/15/2020   Osteoporosis 07/15/2020   Palpitations 07/15/2020   Acute gout of left shoulder 07/10/2020   Rheumatoid arthritis involving multiple sites with positive rheumatoid factor ([place]) 10/28/2015   Mitral regurgitation 05/30/2014   Acute on chronic combined systolic (congestive) and diastolic (congestive) heart failure ([place]) 05/19/2014   Vitamin D deficiency 05/15/2013   Encounter for long-term (current) use of other medications 12/14/2008   Sjogren's syndrome ([place]) 12/14/2008     There is no immunization history on file for this patient.  Conditions to be addressed/monitored:  {USCCMDZASSESSMENTOPTIONS:23563}  There are no care plans that you recently modified to display for this patient.    Medication Assistance: {MEDASSISTANCEINFO:25044}  Compliance/Adherence/Medication fill history: Care Gaps: ***  Star-Rating Drugs: ***  Patient's preferred pharmacy is:  [id number] [place] [address]Phone: [phone]: [phone] [facility] [address]: [phone]: [phone]  [id number] [place], [address]one: [phone]: [phone] pill box? {Yes or If no, why not?:20788} Pt endorses ***% compliance  We discussed: {Pharmacy options:24294} Patient decided to: {US Pharmacy Plan:23885}  Care Plan and Follow Up Patient  Decision:  {FOLLOWUP:24991}  Plan: {CM FOLLOW UP PLAN:25073}  ***

## 2022-05-12 NOTE — Telephone Encounter (Signed)
  Care Management   Follow Up Note   05/12/2022 Name: Tracy Nelson MRN: [id number] DOB: 01-19-1946   Referred by: [name], [name] Reason for referral : No chief complaint on file.   [name] is not feeling well today. I let her know that we will reschedule her appointment. I let the PCP know that she was not feeling well. PCP reports that CMA will reach out to patient, made patient aware. She voiced understanding.   Follow Up Plan: The patient has been provided with contact information for the care management team and has been advised to call with any health related questions or concerns.   [name], CPP, PharmD Clinical Pharmacist Practitioner [facility] [phone]

## 2022-05-15 ENCOUNTER — Encounter (HOSPITAL_COMMUNITY)
Admission: RE | Admit: 2022-05-15 | Discharge: 2022-05-15 | Disposition: A | Discharge status: Home / Self Care | Payer: Medicare PPO | Source: Ambulatory Visit | Attending: Nephrology | Admitting: Nephrology

## 2022-05-15 VITALS — BP 97/73 | HR 87 | Temp 98.6°F | Resp 20

## 2022-05-15 DIAGNOSIS — N183 Chronic kidney disease, stage 3 unspecified: Secondary | ICD-10-CM | POA: Diagnosis present

## 2022-05-15 LAB — IRON AND TIBC
Iron: 56 ug/dL (ref 28–170)
Saturation Ratios: 16 % (ref 10.4–31.8)
TIBC: 344 ug/dL (ref 250–450)
UIBC: 288 ug/dL

## 2022-05-15 LAB — FERRITIN: Ferritin: 101 ng/mL (ref 11–307)

## 2022-05-15 LAB — POCT HEMOGLOBIN-HEMACUE: Hemoglobin: 10.6 g/dL — ABNORMAL LOW (ref 12.0–15.0)

## 2022-05-15 MED ORDER — EPOETIN ALFA-EPBX 10000 UNIT/ML IJ SOLN
INTRAMUSCULAR | Status: AC
  Filled 2022-05-15: qty 1

## 2022-05-15 MED ORDER — EPOETIN ALFA-EPBX 10000 UNIT/ML IJ SOLN
10000.0000 [IU] | INTRAMUSCULAR | Status: DC
  Administered 2022-05-15: 10000 [IU] via SUBCUTANEOUS

## 2022-05-28 ENCOUNTER — Other Ambulatory Visit (HOSPITAL_COMMUNITY): Payer: Self-pay | Admitting: Cardiology

## 2022-05-29 ENCOUNTER — Other Ambulatory Visit (HOSPITAL_COMMUNITY): Payer: Self-pay | Admitting: Cardiology

## 2022-05-29 ENCOUNTER — Encounter (HOSPITAL_COMMUNITY)
Admission: RE | Admit: 2022-05-29 | Discharge: 2022-05-29 | Disposition: A | Discharge status: Home / Self Care | Payer: Medicare PPO | Source: Ambulatory Visit | Attending: Nephrology | Admitting: Nephrology

## 2022-05-29 VITALS — BP 90/73 | HR 88 | Temp 97.9°F | Resp 20

## 2022-05-29 DIAGNOSIS — N183 Chronic kidney disease, stage 3 unspecified: Secondary | ICD-10-CM

## 2022-05-29 LAB — POCT HEMOGLOBIN-HEMACUE: Hemoglobin: 11.3 g/dL — ABNORMAL LOW (ref 12.0–15.0)

## 2022-05-29 MED ORDER — EPOETIN ALFA-EPBX 10000 UNIT/ML IJ SOLN
10000.0000 [IU] | INTRAMUSCULAR | Status: DC
  Administered 2022-05-29: 10000 [IU] via SUBCUTANEOUS

## 2022-05-29 MED ORDER — EPOETIN ALFA-EPBX 10000 UNIT/ML IJ SOLN
INTRAMUSCULAR | Status: AC
  Filled 2022-05-29: qty 1

## 2022-06-11 ENCOUNTER — Ambulatory Visit: Payer: Medicare PPO | Admitting: Nurse Practitioner

## 2022-06-12 ENCOUNTER — Inpatient Hospital Stay (HOSPITAL_COMMUNITY): Admission: RE | Admit: 2022-06-12 | Payer: Medicare PPO | Source: Ambulatory Visit

## 2022-06-19 ENCOUNTER — Encounter (HOSPITAL_COMMUNITY): Payer: Medicare PPO

## 2022-06-22 ENCOUNTER — Encounter (HOSPITAL_COMMUNITY): Payer: Medicare PPO

## 2022-06-24 ENCOUNTER — Encounter (HOSPITAL_COMMUNITY)
Admission: RE | Admit: 2022-06-24 | Discharge: 2022-06-24 | Disposition: A | Discharge status: Home / Self Care | Payer: Medicare PPO | Source: Ambulatory Visit | Attending: Nephrology | Admitting: Nephrology

## 2022-06-24 ENCOUNTER — Telehealth: Payer: Self-pay

## 2022-06-24 VITALS — BP 97/73 | HR 86 | Temp 97.7°F | Resp 18

## 2022-06-24 DIAGNOSIS — N183 Chronic kidney disease, stage 3 unspecified: Secondary | ICD-10-CM | POA: Diagnosis not present

## 2022-06-24 LAB — IRON AND TIBC
Iron: 82 ug/dL (ref 28–170)
Saturation Ratios: 25 % (ref 10.4–31.8)
TIBC: 326 ug/dL (ref 250–450)
UIBC: 244 ug/dL

## 2022-06-24 LAB — POCT HEMOGLOBIN-HEMACUE: Hemoglobin: 10.5 g/dL — ABNORMAL LOW (ref 12.0–15.0)

## 2022-06-24 LAB — FERRITIN: Ferritin: 226 ng/mL (ref 11–307)

## 2022-06-24 MED ORDER — EPOETIN ALFA-EPBX 10000 UNIT/ML IJ SOLN
INTRAMUSCULAR | Status: AC
  Filled 2022-06-24: qty 1

## 2022-06-24 MED ORDER — EPOETIN ALFA-EPBX 10000 UNIT/ML IJ SOLN
10000.0000 [IU] | INTRAMUSCULAR | Status: DC
  Administered 2022-06-24: 10000 [IU] via SUBCUTANEOUS

## 2022-06-24 NOTE — Progress Notes (Signed)
    Called Tracy Nelson, No answer, left message of appointment on 06-24-2022 at 1:30 via telephone visit with [name], Pharm D. Notified to have all medications, supplements, blood pressure and/or blood sugar logs available during appointment and to return call if need to reschedule.    Care Gaps: Covid vaccine overdue Shingrix overdue PNA Vac overdue  Star Rating Drug: None  Any gaps in medications fill history? No  [place] Pharmacist Assistant [phone]

## 2022-06-25 ENCOUNTER — Other Ambulatory Visit: Payer: Self-pay | Admitting: Internal Medicine

## 2022-06-25 DIAGNOSIS — M0579 Rheumatoid arthritis with rheumatoid factor of multiple sites without organ or systems involvement: Secondary | ICD-10-CM

## 2022-06-25 DIAGNOSIS — H16003 Unspecified corneal ulcer, bilateral: Secondary | ICD-10-CM

## 2022-06-26 ENCOUNTER — Telehealth: Payer: Self-pay

## 2022-06-26 ENCOUNTER — Telehealth: Payer: Medicare PPO

## 2022-06-26 ENCOUNTER — Encounter (HOSPITAL_COMMUNITY): Payer: Medicare PPO

## 2022-06-26 NOTE — Progress Notes (Deleted)
Current Barriers:  {pharmacybarriers:24917}  Pharmacist Clinical Goal(s):  Patient will {PHARMACYGOALCHOICES:24921} through collaboration with PharmD and provider.   Interventions: 1:1 collaboration with [name], [name] regarding development and update of comprehensive plan of care as evidenced by provider attestation and co-signature Inter-disciplinary care team collaboration (see longitudinal plan of care) Comprehensive medication review performed; medication list updated in electronic medical record  Hypertension (BP goal <130/80) -{US controlled/uncontrolled:25276} -Current treatment: *** -Medications previously tried: ***  -Current home readings: *** -Current dietary habits: *** -Current exercise habits: *** -{ACTIONS;DENIES/REPORTS:21021675::"Denies"} hypotensive/hypertensive symptoms -Educated on {CCM BP Counseling:25124} -Counseled to monitor BP at home ***, document, and provide log at future appointments -{CCMPHARMDINTERVENTION:25122}  Diabetes (A1c goal {A1c goals:23924}) -{US controlled/uncontrolled:25276} -Current medications: *** -Medications previously tried: ***  -Current home glucose readings fasting glucose: *** post prandial glucose: *** -{ACTIONS;DENIES/REPORTS:21021675::"Denies"} hypoglycemic/hyperglycemic symptoms -Current meal patterns:  breakfast: ***  lunch: ***  dinner: *** snacks: *** drinks: *** -Current exercise: *** -Educated on {CCM DM COUNSELING:25123} -Counseled to check feet daily and get yearly eye exams -{CCMPHARMDINTERVENTION:25122}  Patient Goals/Self-Care Activities Patient will:  - {pharmacypatientgoals:24919}  Follow Up Plan: {CM FOLLOW UP HBZJ:69678}  Chronic Care Management Pharmacy Note  06/26/2022 Name:  KHUSHBOO CHUCK MRN:  [id number] DOB:  1946-02-10  Summary: ***  Recommendations/Changes made from today's visit: ***  Plan: ***   Subjective: WINNI EHRHARD is an 76 y.o. year old female who is a  primary patient of [name], [place].  The CCM team was consulted for assistance with disease management and care coordination needs.    {CCMTELEPHONEFACETOFACE:21091510} for {CCMINITIALFOLLOWUPCHOICE:21091511} in response to provider referral for pharmacy case management and/or care coordination services.   Consent to Services:  {CCMCONSENTOPTIONS:25074}  Patient Care Team: [name], [name] as PCP - General (General Practice) Jorge Ny, LCSW as Social Worker (Licensed Clinical Social Worker) Dianah Field, Sharyn Blitz, [facility] (Pharmacist)  Recent office visits: ***  Recent consult visits: [facility] visits: {Hospital DC Yes/No:25215}   Objective:  Lab Results  Component Value Date   CREATININE 2.40 (H) 03/04/2022   BUN 36 (H) 03/04/2022   EGFR 26 (L) 06/04/2021   GFRNONAA 20 (L) 03/04/2022   NA 135 03/04/2022   K 4.4 03/04/2022   CALCIUM 9.4 03/04/2022   CO2 19 (L) 03/04/2022   GLUCOSE 111 (H) 03/04/2022    Lab Results  Component Value Date/Time   HGBA1C 5.3 03/09/2022 02:34 PM   HGBA1C 6.4 (H) 09/04/2021 12:42 PM    Last diabetic Eye exam:  Lab Results  Component Value Date/Time   HMDIABEYEEXA No Retinopathy 10/21/2021 03:05 PM    Last diabetic Foot exam: No results found for: "HMDIABFOOTEX"   Lab Results  Component Value Date   CHOL 205 (H) 03/09/2022   HDL 91 03/09/2022   LDLCALC 95 03/09/2022   TRIG 111 03/09/2022   CHOLHDL 2.3 03/09/2022       Latest Ref Rng & Units 02/04/2022    3:29 PM 11/06/2021   12:22 PM 09/03/2021   12:25 PM  Hepatic Function  Total Protein 6.5 - 8.1 g/dL 6.0  7.0  6.2   Albumin 3.5 - 5.0 g/dL 3.3  3.5  3.0   AST 15 - 41 U/L 28  42  35   ALT 0 - 44 U/L 25  48  35   Alk Phosphatase 38 - 126 U/L 48  73  88   Total Bilirubin 0.3 - 1.2 mg/dL 0.5  0.4  0.6     Lab Results  Component Value Date/Time   TSH 1.585 02/04/2022 03:29 PM   TSH 1.709 09/03/2021 12:25 PM       Latest Ref Rng & Units 06/24/2022    8:11 AM  05/29/2022    8:10 AM 05/15/2022    8:12 AM  CBC  Hemoglobin 12.0 - 15.0 g/dL 10.5  11.3  10.6     No results found for: "VD25OH", "VITAMINB12"  Clinical ASCVD: {YES/NO:21197} The 10-year ASCVD risk score (Arnett DK, et al., 2019) is: 14.1%   Values used to calculate the score:     Age: 76 years     Sex: Female     Is Non-Hispanic African American: Yes     Diabetic: No     Tobacco smoker: No     Systolic Blood Pressure: 97 mmHg     Is BP treated: Yes     HDL Cholesterol: 91 mg/dL     Total Cholesterol: 205 mg/dL       04/30/2022   11:43 AM 03/09/2022   11:13 AM 04/10/2021   10:15 AM  Depression screen PHQ 2/9  Decreased Interest 0 0 0  Down, Depressed, Hopeless 0 0 0  PHQ - 2 Score 0 0 0     ***Other: (CHADS2VASc if Afib, MMRC or CAT for COPD, ACT, DEXA)  Social History   Tobacco Use  Smoking Status Never  Smokeless Tobacco Never   BP Readings from Last 3 Encounters:  06/24/22 97/73  05/29/22 90/73  05/15/22 97/73   Pulse Readings from Last 3 Encounters:  06/24/22 86  05/29/22 88  05/15/22 87   Wt Readings from Last 3 Encounters:  04/30/22 97 lb 3.2 oz (44.1 kg)  03/09/22 102 lb (46.3 kg)  03/04/22 100 lb 9.6 oz (45.6 kg)   BMI Readings from Last 3 Encounters:  04/30/22 18.37 kg/m  03/09/22 18.66 kg/m  03/04/22 18.40 kg/m    Assessment/Interventions: Review of patient past medical history, allergies, medications, health status, including review of consultants reports, laboratory and other test data, was performed as part of comprehensive evaluation and provision of chronic care management services.   SDOH:  (Social Determinants of Health) assessments and interventions performed: {yes/no:20286} SDOH Interventions    Flowsheet Row Clinical Support from 04/30/2022 in [place] Telephone from 11/05/2021 in [place] [facility] from 05/15/2021 in [facility] Telephone from 11/01/2020 in [place] ED to Hosp-Admission (Discharged) from 10/18/2020 in [facility]  SDOH Interventions       Food Insecurity Interventions Intervention Not Indicated -- -- -- Intervention Not Indicated  Housing Interventions -- -- -- -- Intervention Not Indicated  Transportation Interventions Intervention Not Indicated Taxi Voucher Given -- Sales promotion account executive  Financial Strain Interventions Intervention Not Indicated -- --  Aletha Halim send referral for food assistance] -- Other (Comment)  [referring to medication assistance programs]  Physical Activity Interventions Patient Refused, Other (Comments) -- -- -- --  Stress Interventions Intervention Not Indicated -- -- -- --      SDOH Screenings   Food Insecurity: No Food Insecurity (04/30/2022)  Housing: Low Risk  (10/22/2020)  Transportation Needs: No Transportation Needs (04/30/2022)  Depression (PHQ2-9): Low Risk  (04/30/2022)  Financial Resource Strain: Low Risk  (04/30/2022)  Physical Activity: Inactive (04/30/2022)  Stress: No Stress Concern Present (04/30/2022)  Tobacco Use: Low Risk  (04/30/2022)    [facility]  Allergies  Allergen Reactions  Baclofen Nausea And Vomiting   Penicillin G     Other reaction(s): Unknown   Penicillins Rash    Medications Reviewed Today     Reviewed by Landis Martins, RN (Registered Nurse) on 06/24/22 at [place] List Status: <None>   Medication Order Taking? Sig Documenting Provider Last Dose Status Informant  amiodarone (PACERONE) 200 MG tablet [id number]  TAKE 1 TABLET BY MOUTH EVERY DAY [name], MD  Active   azaTHIOprine (IMURAN) 50 MG tablet [id number]  TAKE 2 TABLETS BY MOUTH EVERY DAY Rice, Resa Miner, MD  Active   bumetanide (BUMEX) 2 MG tablet [id number]  Take 1 tablet (2 mg total) by mouth daily. [name], MD  Active   Cholecalciferol (VITAMIN D-3 PO) [id number]  Take 25 mcg by  mouth 3 (three) times a week. [provider]  Active   cholestyramine (QUESTRAN) 4 g packet [id number]  Take 1 packet (4 g total) by mouth daily. [name], MD  Active Self  cyclobenzaprine (FLEXERIL) 10 MG tablet [id number]  Take 1 tablet (10 mg total) by mouth 3 (three) times daily as needed for muscle spasms. [name], [name]  Active   ELIQUIS 2.5 MG TABS tablet [id number]  TAKE 1 TABLET BY MOUTH TWICE A DAY [name], MD  Active   Ensure [facility]) [id number]  Take 237 mLs by mouth 2 (two) times daily between meals. [provider]  Active Self  folic acid (FOLVITE) 1 MG tablet [id number]  TAKE 1 TABLET BY MOUTH EVERY DAY [name], MD  Active   gabapentin (NEURONTIN) 100 MG capsule [id number]  Take 100 mg by mouth as needed.  Patient not taking: Reported on 04/30/2022   [provider]  Active   hydroxychloroquine (PLAQUENIL) 200 MG tablet [id number]  TAKE 1 TABLET BY MOUTH EVERY DAY [name], MD  Active   hydroxypropyl methylcellulose / hypromellose (ISOPTO TEARS / GONIOVISC) 2.5 % ophthalmic solution [id number]  Place 1 drop into both eyes as needed for dry eyes. [provider]  Active   lidocaine (LIDODERM) 5 % [id number]  Place 1 patch onto the skin daily. Remove & Discard patch within 12 hours or as directed by MD [name], [name]  Active   meclizine (ANTIVERT) 12.5 MG tablet [id number]  Take 1 tablet (12.5 mg total) by mouth 3 (three) times daily as needed for dizziness.  Patient not taking: Reported on 03/25/2022   [name], [name]  Active   Menthol, Topical Analgesic, (ICY HOT BACK EX) [id number]  Apply 1 application topically daily as needed (pain). [provider]  Active Self  midodrine (PROAMATINE) 5 MG tablet [id number]  Take 1 tablet (5 mg total) by mouth 3 (three) times daily with meals. [name], MD  Active   MITIGARE 0.6 MG CAPS [id number]  Take 0.6 mg by mouth daily as needed (gout). [provider]  Active Self  Multiple Vitamin (MULTIVITAMIN) tablet [id number]  Take 1 tablet by mouth daily. [provider]  Active Self  omeprazole (PRILOSEC) 40 MG capsule [id number]  Take 40 mg by mouth daily. [provider]  Active Self  polyethylene glycol (MIRALAX / GLYCOLAX) 17 g packet [id number]  Take 17 g by mouth as needed. [provider]  Active Self  predniSONE (DELTASONE) 5 MG tablet [id number]  TAKE 1 TABLET BY MOUTH EVERY DAY WITH BREAKFAST Rice, Resa Miner, MD  Active   traMADol (ULTRAM) 50 MG tablet [id number]  Take  1 tablet (50 mg total) by mouth every 12 (twelve) hours as needed.  Patient not taking: Reported on 03/25/2022   Couture, Cortni S, PA-C  Active   Vericiguat (VERQUVO) 10 MG TABS [id number]  Take 10 mg by mouth daily. 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   Monoclonal gammopathy 03/10/2021   Hypotension 10/22/2020   Atypical chest pain 10/22/2020   CKD (chronic kidney disease), stage III ([place]) 10/22/2020   Nausea & vomiting 10/22/2020   Physical deconditioning 10/22/2020   Atrial flutter ([place]) 10/18/2020   Arthritis 07/15/2020   GERD (gastroesophageal reflux disease) 07/15/2020   Colitis 07/15/2020   Non-ischemic cardiomyopathy ([place]) 07/15/2020   HFrEF (heart failure with reduced ejection fraction) ([place]) 07/15/2020   HTN (hypertension) 07/15/2020   Osteoporosis 07/15/2020   Palpitations 07/15/2020   Acute gout of left shoulder 07/10/2020   Rheumatoid arthritis involving multiple sites with positive rheumatoid factor ([place]) 10/28/2015   Mitral regurgitation 05/30/2014   Acute on chronic combined systolic (congestive) and diastolic (congestive) heart failure ([place]) 05/19/2014   Vitamin D deficiency 05/15/2013   Encounter for long-term (current) use of other medications 12/14/2008   Sjogren's syndrome ([place]) 12/14/2008     There is no  immunization history on file for this patient.  Conditions to be addressed/monitored:  {USCCMDZASSESSMENTOPTIONS:23563}  There are no care plans that you recently modified to display for this patient.    Medication Assistance: {MEDASSISTANCEINFO:25044}  Compliance/Adherence/Medication fill history: Care Gaps: ***  Star-Rating Drugs: ***  Patient's preferred pharmacy is:  [id number] [place] [place]Phone: [phone]: [phone] [facility] [address]Phone: [phone]: [phone]  [id number] [place], [address]: [phone]: [phone] pill box? {Yes or If no, why not?:20788} Pt endorses ***% compliance  We discussed: {Pharmacy options:24294} Patient decided to: {US Pharmacy Plan:23885}  Care Plan and Follow Up Patient Decision:  {FOLLOWUP:24991}  Plan: {CM FOLLOW UP PLAN:25073}  ***

## 2022-06-26 NOTE — Telephone Encounter (Signed)
  Care Management   Follow Up Note   06/26/2022 Name: Tracy Nelson MRN: [id number] DOB: 1945-09-25   Referred by: Tracy Brine, [name] Reason for referral : Chronic Care Management (Clinical Pharmacist )   We discussed the importance of being able to come to appointments so we can follow up on past visits, since our visit today, was to follow up on labs that would have been done during her November 7th visit, per patient request. Tracy Nelson wants to come to appointments but reports that at this time transportation is her largest issue. She relies heavily on her family to take her to appointments because [facility] is no longer offering transportation. She would like her appointment to be scheduled later in the day, because this allows her family to take her. I asked if she was okay with me discussing her transportation issues with the CCM social worker. She said that it was fine to do that. I will send a message to Tracy Nelson to request assistance for the patient for transportation issues. I will let the scheduling team know about Tracy Nelson's request to be seen later in the day.   Follow Up Plan: The patient has been provided with contact information for the care management team and has been advised to call with any health related questions or concerns.   [name], CPP, PharmD Clinical Pharmacist Practitioner [facility] [phone]

## 2022-07-07 ENCOUNTER — Encounter: Payer: Self-pay | Admitting: *Deleted

## 2022-07-07 ENCOUNTER — Ambulatory Visit (HOSPITAL_BASED_OUTPATIENT_CLINIC_OR_DEPARTMENT_OTHER)
Admission: RE | Admit: 2022-07-07 | Discharge: 2022-07-07 | Disposition: A | Discharge status: Home / Self Care | Payer: Medicare PPO | Source: Ambulatory Visit | Attending: Cardiology | Admitting: Cardiology

## 2022-07-07 ENCOUNTER — Encounter (HOSPITAL_COMMUNITY): Payer: Self-pay | Admitting: Cardiology

## 2022-07-07 VITALS — BP 102/60 | HR 83 | Wt 93.8 lb

## 2022-07-07 DIAGNOSIS — Z006 Encounter for examination for normal comparison and control in clinical research program: Secondary | ICD-10-CM

## 2022-07-07 DIAGNOSIS — I5022 Chronic systolic (congestive) heart failure: Secondary | ICD-10-CM

## 2022-07-07 DIAGNOSIS — S72142A Displaced intertrochanteric fracture of left femur, initial encounter for closed fracture: Secondary | ICD-10-CM | POA: Diagnosis not present

## 2022-07-07 DIAGNOSIS — S72002A Fracture of unspecified part of neck of left femur, initial encounter for closed fracture: Secondary | ICD-10-CM | POA: Diagnosis not present

## 2022-07-07 LAB — CBC
HCT: 33.6 % — ABNORMAL LOW (ref 36.0–46.0)
Hemoglobin: 10.5 g/dL — ABNORMAL LOW (ref 12.0–15.0)
MCH: 29.5 pg (ref 26.0–34.0)
MCHC: 31.3 g/dL (ref 30.0–36.0)
MCV: 94.4 fL (ref 80.0–100.0)
Platelets: 277 10*3/uL (ref 150–400)
RBC: 3.56 MIL/uL — ABNORMAL LOW (ref 3.87–5.11)
RDW: 16.6 % — ABNORMAL HIGH (ref 11.5–15.5)
WBC: 11.3 10*3/uL — ABNORMAL HIGH (ref 4.0–10.5)
nRBC: 0 % (ref 0.0–0.2)

## 2022-07-07 LAB — COMPREHENSIVE METABOLIC PANEL
ALT: 32 U/L (ref 0–44)
AST: 30 U/L (ref 15–41)
Albumin: 3.2 g/dL — ABNORMAL LOW (ref 3.5–5.0)
Alkaline Phosphatase: 56 U/L (ref 38–126)
Anion gap: 12 (ref 5–15)
BUN: 65 mg/dL — ABNORMAL HIGH (ref 8–23)
CO2: 18 mmol/L — ABNORMAL LOW (ref 22–32)
Calcium: 9.5 mg/dL (ref 8.9–10.3)
Chloride: 109 mmol/L (ref 98–111)
Creatinine, Ser: 3.35 mg/dL — ABNORMAL HIGH (ref 0.44–1.00)
GFR, Estimated: 14 mL/min — ABNORMAL LOW (ref >60)
Glucose, Bld: 104 mg/dL — ABNORMAL HIGH (ref 70–99)
Potassium: 4.6 mmol/L (ref 3.5–5.1)
Sodium: 139 mmol/L (ref 135–145)
Total Bilirubin: 0.6 mg/dL (ref 0.3–1.2)
Total Protein: 6.3 g/dL — ABNORMAL LOW (ref 6.5–8.1)

## 2022-07-07 LAB — BRAIN NATRIURETIC PEPTIDE: B Natriuretic Peptide: 232 pg/mL — ABNORMAL HIGH (ref 0.0–100.0)

## 2022-07-07 LAB — TSH: TSH: 1.167 u[IU]/mL (ref 0.350–4.500)

## 2022-07-07 NOTE — Research (Signed)
Spoke with patient and son about batwire research study  They are to call back if want to move forward.   Philemon Kingdom :) RN BSN  Clinical Research Nurse  Be strong and take heart, all you who hope in the [place]. ~ Psalm 31:24

## 2022-07-07 NOTE — Patient Instructions (Signed)
EKG done today.  Labs done today. We will contact you only if your labs are abnormal.  No medication changes were made. Please continue all current medications as prescribed.  Your physician recommends that you schedule a follow-up appointment in: 2 months with our NP/PA Clinic here in our office.   If you have any questions or concerns before your next appointment please send [place] a message through [place] or call our office at [phone].    TO LEAVE A MESSAGE FOR THE NURSE SELECT OPTION 2, PLEASE LEAVE A MESSAGE INCLUDING: YOUR NAME DATE OF BIRTH CALL BACK NUMBER REASON FOR CALL**this is important as we prioritize the call backs  YOU WILL RECEIVE A CALL BACK THE SAME DAY AS LONG AS YOU CALL BEFORE 4:00 PM   Do the following things EVERYDAY: Weigh yourself in the morning before breakfast. Write it down and keep it in a log. Take your medicines as prescribed Eat low salt foods--Limit salt (sodium) to 2000 mg per day.  Stay as active as you can everyday Limit all fluids for the day to less than 2 liters   At the [facility], you and your health needs are our priority. As part of our continuing mission to provide you with exceptional heart care, we have created designated Provider Care Teams. These Care Teams include your primary Cardiologist (physician) and Advanced Practice Providers (APPs- Physician Assistants and Nurse Practitioners) who all work together to provide you with the care you need, when you need it.   You may see any of the following providers on your designated Care Team at your next follow up: Dr Glori Bickers Dr Haynes Kerns, NP Lyda Jester, [place] Audry Riles, PharmD   Please be sure to bring in all your medications bottles to every appointment.

## 2022-07-08 ENCOUNTER — Emergency Department (HOSPITAL_COMMUNITY): Payer: Medicare PPO

## 2022-07-08 ENCOUNTER — Encounter (HOSPITAL_COMMUNITY)
Admission: RE | Admit: 2022-07-08 | Discharge: 2022-07-08 | Disposition: A | Discharge status: Home / Self Care | Payer: Medicare PPO | Source: Ambulatory Visit | Attending: Nephrology | Admitting: Nephrology

## 2022-07-08 ENCOUNTER — Encounter (HOSPITAL_COMMUNITY): Payer: Self-pay | Admitting: Emergency Medicine

## 2022-07-08 ENCOUNTER — Inpatient Hospital Stay (HOSPITAL_COMMUNITY)
Admission: EM | Admit: 2022-07-08 | Discharge: 2022-07-15 | DRG: 481 | Disposition: A | Discharge status: Skilled Nursing Facility | Payer: Medicare PPO | Attending: Family Medicine | Admitting: Family Medicine

## 2022-07-08 VITALS — BP 96/79 | HR 52 | Temp 98.1°F | Resp 18

## 2022-07-08 DIAGNOSIS — M069 Rheumatoid arthritis, unspecified: Secondary | ICD-10-CM | POA: Diagnosis present

## 2022-07-08 DIAGNOSIS — D509 Iron deficiency anemia, unspecified: Secondary | ICD-10-CM | POA: Diagnosis present

## 2022-07-08 DIAGNOSIS — S72002A Fracture of unspecified part of neck of left femur, initial encounter for closed fracture: Secondary | ICD-10-CM | POA: Diagnosis present

## 2022-07-08 DIAGNOSIS — Z8249 Family history of ischemic heart disease and other diseases of the circulatory system: Secondary | ICD-10-CM

## 2022-07-08 DIAGNOSIS — Z7960 Long term (current) use of unspecified immunomodulators and immunosuppressants: Secondary | ICD-10-CM

## 2022-07-08 DIAGNOSIS — M1990 Unspecified osteoarthritis, unspecified site: Secondary | ICD-10-CM | POA: Diagnosis present

## 2022-07-08 DIAGNOSIS — R1310 Dysphagia, unspecified: Secondary | ICD-10-CM | POA: Diagnosis present

## 2022-07-08 DIAGNOSIS — Z8261 Family history of arthritis: Secondary | ICD-10-CM

## 2022-07-08 DIAGNOSIS — Z79899 Other long term (current) drug therapy: Secondary | ICD-10-CM

## 2022-07-08 DIAGNOSIS — M810 Age-related osteoporosis without current pathological fracture: Secondary | ICD-10-CM | POA: Diagnosis present

## 2022-07-08 DIAGNOSIS — M109 Gout, unspecified: Secondary | ICD-10-CM | POA: Diagnosis present

## 2022-07-08 DIAGNOSIS — N170 Acute kidney failure with tubular necrosis: Secondary | ICD-10-CM | POA: Diagnosis not present

## 2022-07-08 DIAGNOSIS — N289 Disorder of kidney and ureter, unspecified: Secondary | ICD-10-CM | POA: Diagnosis not present

## 2022-07-08 DIAGNOSIS — I4892 Unspecified atrial flutter: Secondary | ICD-10-CM | POA: Diagnosis present

## 2022-07-08 DIAGNOSIS — I5020 Unspecified systolic (congestive) heart failure: Secondary | ICD-10-CM | POA: Diagnosis present

## 2022-07-08 DIAGNOSIS — R627 Adult failure to thrive: Secondary | ICD-10-CM | POA: Diagnosis present

## 2022-07-08 DIAGNOSIS — Y92019 Unspecified place in single-family (private) house as the place of occurrence of the external cause: Secondary | ICD-10-CM

## 2022-07-08 DIAGNOSIS — I480 Paroxysmal atrial fibrillation: Secondary | ICD-10-CM | POA: Diagnosis present

## 2022-07-08 DIAGNOSIS — D84821 Immunodeficiency due to drugs: Secondary | ICD-10-CM | POA: Diagnosis present

## 2022-07-08 DIAGNOSIS — Z7901 Long term (current) use of anticoagulants: Secondary | ICD-10-CM

## 2022-07-08 DIAGNOSIS — I9589 Other hypotension: Secondary | ICD-10-CM | POA: Diagnosis present

## 2022-07-08 DIAGNOSIS — N184 Chronic kidney disease, stage 4 (severe): Secondary | ICD-10-CM | POA: Diagnosis present

## 2022-07-08 DIAGNOSIS — D472 Monoclonal gammopathy: Secondary | ICD-10-CM | POA: Diagnosis present

## 2022-07-08 DIAGNOSIS — W010XXA Fall on same level from slipping, tripping and stumbling without subsequent striking against object, initial encounter: Secondary | ICD-10-CM | POA: Diagnosis present

## 2022-07-08 DIAGNOSIS — I5022 Chronic systolic (congestive) heart failure: Secondary | ICD-10-CM | POA: Diagnosis present

## 2022-07-08 DIAGNOSIS — Z833 Family history of diabetes mellitus: Secondary | ICD-10-CM

## 2022-07-08 DIAGNOSIS — N183 Chronic kidney disease, stage 3 unspecified: Secondary | ICD-10-CM

## 2022-07-08 DIAGNOSIS — I110 Hypertensive heart disease with heart failure: Secondary | ICD-10-CM | POA: Diagnosis not present

## 2022-07-08 DIAGNOSIS — I130 Hypertensive heart and chronic kidney disease with heart failure and stage 1 through stage 4 chronic kidney disease, or unspecified chronic kidney disease: Secondary | ICD-10-CM | POA: Diagnosis present

## 2022-07-08 DIAGNOSIS — K219 Gastro-esophageal reflux disease without esophagitis: Secondary | ICD-10-CM | POA: Diagnosis present

## 2022-07-08 DIAGNOSIS — N179 Acute kidney failure, unspecified: Secondary | ICD-10-CM | POA: Diagnosis not present

## 2022-07-08 DIAGNOSIS — E872 Acidosis, unspecified: Secondary | ICD-10-CM | POA: Diagnosis present

## 2022-07-08 DIAGNOSIS — D62 Acute posthemorrhagic anemia: Secondary | ICD-10-CM | POA: Diagnosis not present

## 2022-07-08 DIAGNOSIS — R54 Age-related physical debility: Secondary | ICD-10-CM | POA: Diagnosis present

## 2022-07-08 DIAGNOSIS — S72142A Displaced intertrochanteric fracture of left femur, initial encounter for closed fracture: Secondary | ICD-10-CM | POA: Diagnosis present

## 2022-07-08 DIAGNOSIS — Z681 Body mass index (BMI) 19 or less, adult: Secondary | ICD-10-CM | POA: Diagnosis not present

## 2022-07-08 DIAGNOSIS — Z888 Allergy status to other drugs, medicaments and biological substances status: Secondary | ICD-10-CM

## 2022-07-08 DIAGNOSIS — Z803 Family history of malignant neoplasm of breast: Secondary | ICD-10-CM

## 2022-07-08 DIAGNOSIS — I5021 Acute systolic (congestive) heart failure: Secondary | ICD-10-CM | POA: Diagnosis not present

## 2022-07-08 DIAGNOSIS — Z01810 Encounter for preprocedural cardiovascular examination: Secondary | ICD-10-CM | POA: Diagnosis not present

## 2022-07-08 DIAGNOSIS — I509 Heart failure, unspecified: Secondary | ICD-10-CM | POA: Diagnosis not present

## 2022-07-08 DIAGNOSIS — R079 Chest pain, unspecified: Secondary | ICD-10-CM | POA: Diagnosis not present

## 2022-07-08 DIAGNOSIS — I428 Other cardiomyopathies: Secondary | ICD-10-CM

## 2022-07-08 DIAGNOSIS — I10 Essential (primary) hypertension: Secondary | ICD-10-CM | POA: Diagnosis present

## 2022-07-08 DIAGNOSIS — I420 Dilated cardiomyopathy: Secondary | ICD-10-CM | POA: Diagnosis present

## 2022-07-08 DIAGNOSIS — Z880 Allergy status to penicillin: Secondary | ICD-10-CM

## 2022-07-08 LAB — CBC WITH DIFFERENTIAL/PLATELET
Abs Immature Granulocytes: 0.09 10*3/uL — ABNORMAL HIGH (ref 0.00–0.07)
Basophils Absolute: 0 10*3/uL (ref 0.0–0.1)
Basophils Relative: 0 %
Eosinophils Absolute: 0 10*3/uL (ref 0.0–0.5)
Eosinophils Relative: 0 %
HCT: 36 % (ref 36.0–46.0)
Hemoglobin: 10.9 g/dL — ABNORMAL LOW (ref 12.0–15.0)
Immature Granulocytes: 1 %
Lymphocytes Relative: 6 %
Lymphs Abs: 0.6 10*3/uL — ABNORMAL LOW (ref 0.7–4.0)
MCH: 29 pg (ref 26.0–34.0)
MCHC: 30.3 g/dL (ref 30.0–36.0)
MCV: 95.7 fL (ref 80.0–100.0)
Monocytes Absolute: 0.6 10*3/uL (ref 0.1–1.0)
Monocytes Relative: 5 %
Neutro Abs: 9.1 10*3/uL — ABNORMAL HIGH (ref 1.7–7.7)
Neutrophils Relative %: 88 %
Platelets: 246 10*3/uL (ref 150–400)
RBC: 3.76 MIL/uL — ABNORMAL LOW (ref 3.87–5.11)
RDW: 16.7 % — ABNORMAL HIGH (ref 11.5–15.5)
WBC: 10.4 10*3/uL (ref 4.0–10.5)
nRBC: 0 % (ref 0.0–0.2)

## 2022-07-08 LAB — BASIC METABOLIC PANEL
Anion gap: 13 (ref 5–15)
BUN: 69 mg/dL — ABNORMAL HIGH (ref 8–23)
CO2: 20 mmol/L — ABNORMAL LOW (ref 22–32)
Calcium: 9.6 mg/dL (ref 8.9–10.3)
Chloride: 108 mmol/L (ref 98–111)
Creatinine, Ser: 3.33 mg/dL — ABNORMAL HIGH (ref 0.44–1.00)
GFR, Estimated: 14 mL/min — ABNORMAL LOW (ref >60)
Glucose, Bld: 119 mg/dL — ABNORMAL HIGH (ref 70–99)
Potassium: 4.5 mmol/L (ref 3.5–5.1)
Sodium: 141 mmol/L (ref 135–145)

## 2022-07-08 LAB — POCT HEMOGLOBIN-HEMACUE: Hemoglobin: 10.7 g/dL — ABNORMAL LOW (ref 12.0–15.0)

## 2022-07-08 MED ORDER — POLYETHYLENE GLYCOL 3350 17 G PO PACK: 17.0000 g | PACK | Freq: Every day | ORAL | Status: DC | PRN

## 2022-07-08 MED ORDER — OXYCODONE-ACETAMINOPHEN 5-325 MG PO TABS
1.0000 | ORAL_TABLET | Freq: Once | ORAL | Status: AC
  Administered 2022-07-08: 1 via ORAL
  Filled 2022-07-08: qty 1

## 2022-07-08 MED ORDER — ADULT MULTIVITAMIN W/MINERALS CH
1.0000 | ORAL_TABLET | Freq: Every day | ORAL | Status: DC
  Administered 2022-07-09 – 2022-07-15 (×6): 1 via ORAL
  Filled 2022-07-08 (×6): qty 1

## 2022-07-08 MED ORDER — PANTOPRAZOLE SODIUM 40 MG PO TBEC
40.0000 mg | DELAYED_RELEASE_TABLET | Freq: Every day | ORAL | Status: DC
  Administered 2022-07-09: 40 mg via ORAL
  Filled 2022-07-08: qty 1

## 2022-07-08 MED ORDER — OXYCODONE-ACETAMINOPHEN 5-325 MG PO TABS
1.0000 | ORAL_TABLET | Freq: Four times a day (QID) | ORAL | Status: DC | PRN
  Administered 2022-07-09 – 2022-07-10 (×3): 2 via ORAL
  Filled 2022-07-08 (×3): qty 2
  Filled 2022-07-08 (×2): qty 1

## 2022-07-08 MED ORDER — PROCHLORPERAZINE EDISYLATE 10 MG/2ML IJ SOLN
5.0000 mg | Freq: Four times a day (QID) | INTRAMUSCULAR | Status: DC | PRN
  Administered 2022-07-08 – 2022-07-10 (×4): 5 mg via INTRAVENOUS
  Filled 2022-07-08 (×4): qty 2

## 2022-07-08 MED ORDER — SENNOSIDES-DOCUSATE SODIUM 8.6-50 MG PO TABS
1.0000 | ORAL_TABLET | Freq: Every day | ORAL | Status: DC
  Administered 2022-07-09 – 2022-07-11 (×3): 1 via ORAL
  Filled 2022-07-08 (×4): qty 1

## 2022-07-08 MED ORDER — HYDROMORPHONE HCL 1 MG/ML IJ SOLN
0.5000 mg | INTRAMUSCULAR | Status: DC | PRN
  Administered 2022-07-08 – 2022-07-10 (×6): 0.5 mg via INTRAVENOUS
  Filled 2022-07-08 (×5): qty 0.5
  Filled 2022-07-08: qty 1

## 2022-07-08 MED ORDER — FENTANYL CITRATE PF 50 MCG/ML IJ SOSY
25.0000 ug | PREFILLED_SYRINGE | Freq: Once | INTRAMUSCULAR | Status: AC
  Administered 2022-07-08: 25 ug via INTRAVENOUS
  Filled 2022-07-08: qty 1

## 2022-07-08 MED ORDER — MELATONIN 5 MG PO TABS
5.0000 mg | ORAL_TABLET | Freq: Every evening | ORAL | Status: DC | PRN
  Filled 2022-07-08: qty 1

## 2022-07-08 MED ORDER — EPOETIN ALFA-EPBX 10000 UNIT/ML IJ SOLN
10000.0000 [IU] | INTRAMUSCULAR | Status: DC
  Administered 2022-07-08: 10000 [IU] via SUBCUTANEOUS

## 2022-07-08 MED ORDER — ACETAMINOPHEN 325 MG PO TABS: 650.0000 mg | ORAL_TABLET | Freq: Four times a day (QID) | ORAL | Status: DC | PRN

## 2022-07-08 MED ORDER — EPOETIN ALFA-EPBX 10000 UNIT/ML IJ SOLN
INTRAMUSCULAR | Status: AC
  Filled 2022-07-08: qty 1

## 2022-07-08 MED ORDER — MIDODRINE HCL 5 MG PO TABS
5.0000 mg | ORAL_TABLET | Freq: Three times a day (TID) | ORAL | Status: DC
  Administered 2022-07-09 – 2022-07-13 (×8): 5 mg via ORAL
  Filled 2022-07-08 (×11): qty 1

## 2022-07-08 MED ORDER — ONDANSETRON HCL 4 MG/2ML IJ SOLN
4.0000 mg | Freq: Once | INTRAMUSCULAR | Status: AC
  Administered 2022-07-08: 4 mg via INTRAVENOUS
  Filled 2022-07-08: qty 2

## 2022-07-08 NOTE — ED Notes (Addendum)
Patient transported to XR. 

## 2022-07-08 NOTE — ED Triage Notes (Signed)
Pt arrives via EMS from after a fall at home on left hip. EMS reports no obvious deformity, denies hitting head or LOC.

## 2022-07-08 NOTE — ED Provider Notes (Signed)
[facility] EMERGENCY DEPARTMENT Provider Note   CSN: [id number] Arrival date & time: 07/08/22  1643     History  Chief Complaint  Patient presents with   Fall   Hip Pain    Tracy Nelson is a 76 y.o. female.  Did not is a 76 year old female with a past medical history of CHF, A-fib on Eliquis, RA presenting to the emergency department with left hip pain after a fall.  The patient states that this afternoon she tripped and fell and landed on her left hip.  She denies hitting her head or losing consciousness.  She states that she was unable to get up and walk after the fall.  She states that her pain in her left hip is radiating to the right.  She denies any other pain or injuries from the fall.  She denies any numbness or weakness.    The history is provided by the patient and a relative.  Fall  Hip Pain       Home Medications Prior to Admission medications   Medication Sig Start Date End Date Taking? Authorizing Provider  amiodarone (PACERONE) 200 MG tablet TAKE 1 TABLET BY MOUTH EVERY DAY 12/19/21   [name], MD  azaTHIOprine (IMURAN) 50 MG tablet TAKE 2 TABLETS BY MOUTH EVERY DAY 04/20/22   Rice, Resa Miner, MD  bumetanide (BUMEX) 2 MG tablet Take 1 tablet (2 mg total) by mouth daily. 02/06/22   [name], MD  Cholecalciferol (VITAMIN D3) 25 MCG (1000 UT) CAPS Take 1 capsule by mouth daily in the afternoon.    [provider]  cholestyramine (QUESTRAN) 4 g packet Take 1 packet (4 g total) by mouth daily. 08/08/20   [name], MD  cyclobenzaprine (FLEXERIL) 10 MG tablet Take 1 tablet (10 mg total) by mouth 3 (three) times daily as needed for muscle spasms. 12/29/21   [name], [name]  ELIQUIS 2.5 MG TABS tablet TAKE 1 TABLET BY MOUTH TWICE A DAY 12/31/21   [name], MD  Ensure (ENSURE) Take 237 mLs by mouth 2 (two) times daily between meals.    [provider]  folic acid (FOLVITE) 1 MG tablet TAKE 1  TABLET BY MOUTH EVERY DAY 07/30/21   [name], MD  hydroxychloroquine (PLAQUENIL) 200 MG tablet TAKE 1 TABLET BY MOUTH EVERY DAY 05/29/22   [name], MD  hydroxypropyl methylcellulose / hypromellose (ISOPTO TEARS / GONIOVISC) 2.5 % ophthalmic solution Place 1 drop into both eyes as needed for dry eyes.    [provider]  lidocaine (LIDODERM) 5 % Place 1 patch onto the skin daily. Remove & Discard patch within 12 hours or as directed by MD 03/09/22   [name], [name]  Menthol, Topical Analgesic, (ICY HOT BACK EX) Apply 1 application topically daily as needed (pain).    [provider]  midodrine (PROAMATINE) 5 MG tablet Take 1 tablet (5 mg total) by mouth 3 (three) times daily with meals. 02/04/22   [name], MD  MITIGARE 0.6 MG CAPS Take 0.6 mg by mouth daily as needed (gout). 07/18/20   [provider]  Multiple Vitamin (MULTIVITAMIN) tablet Take 1 tablet by mouth daily.    [provider]  omeprazole (PRILOSEC) 40 MG capsule Take 40 mg by mouth daily.    [provider]  polyethylene glycol (MIRALAX / GLYCOLAX) 17 g packet Take 17 g by mouth as needed.    [provider]  predniSONE (DELTASONE) 5 MG tablet TAKE 1 TABLET BY MOUTH EVERY DAY WITH BREAKFAST 05/04/22   Rice, Resa Miner, MD  Vericiguat (VERQUVO) 10 MG TABS Take 10 mg by mouth daily. 09/03/21   [name], MD      Allergies    Baclofen, Penicillin g, and Penicillins    Review of Systems   Review of Systems  Physical Exam Updated Vital Signs BP 110/83 (BP Location: Right Arm)   Pulse 79   Temp 98.7 F (37.1 C) (Oral)   Resp 18   Ht '5\' 1"'$  (1.549 m)   Wt 42.5 kg   SpO2 100%   BMI 17.70 kg/m  Physical Exam Vitals and nursing note reviewed.  Constitutional:      General: She is not in acute distress.    Appearance: Normal appearance.  HENT:     Head: Normocephalic and atraumatic.     Nose: Nose normal.     Mouth/Throat:     Mouth:  Mucous membranes are moist.     Pharynx: Oropharynx is clear.  Eyes:     Extraocular Movements: Extraocular movements intact.     Conjunctiva/sclera: Conjunctivae normal.  Neck:     Comments: No midline neck tenderness Cardiovascular:     Rate and Rhythm: Normal rate and regular rhythm.     Pulses: Normal pulses.     Heart sounds: Normal heart sounds.  Pulmonary:     Effort: Pulmonary effort is normal.     Breath sounds: Normal breath sounds.  Abdominal:     General: Abdomen is flat.     Palpations: Abdomen is soft.     Tenderness: There is no abdominal tenderness.  Musculoskeletal:     Cervical back: Normal range of motion and neck supple.     Comments: Left leg externally rotated with tenderness to palpation of the left hip and ROM limited secondary to pain No tenderness to palpation of the bilateral upper extremities or right lower extremity with full right hip internal/external rotation No midline back tenderness  Skin:    General: Skin is warm and dry.  Neurological:     General: No focal deficit present.     Mental Status: She is alert and oriented to person, place, and time.     Sensory: No sensory deficit.     Motor: No weakness.  Psychiatric:        Mood and Affect: Mood normal.        Behavior: Behavior normal.     ED Results / Procedures / Treatments   Labs (all labs ordered are listed, but only abnormal results are displayed) Labs Reviewed  BASIC METABOLIC PANEL - Abnormal; Notable for the following components:      Result Value   CO2 20 (*)    Glucose, Bld 119 (*)    BUN 69 (*)    Creatinine, Ser 3.33 (*)    GFR, Estimated 14 (*)    All other components within normal limits  CBC WITH DIFFERENTIAL/PLATELET - Abnormal; Notable for the following components:   RBC 3.76 (*)    Hemoglobin 10.9 (*)    RDW 16.7 (*)    Neutro Abs 9.1 (*)    Lymphs Abs 0.6 (*)    Abs Immature Granulocytes 0.09 (*)    All other components within normal limits     EKG None  Radiology CT Hip Left Wo Contrast  Result Date: 07/08/2022 CLINICAL DATA:  Left hip fracture EXAM: CT OF THE LEFT HIP WITHOUT  CONTRAST TECHNIQUE: Multidetector CT imaging of the left hip was performed according to the standard protocol. Multiplanar CT image reconstructions were also generated. RADIATION DOSE REDUCTION: This exam was performed according to the departmental dose-optimization program which includes automated exposure control, adjustment of the mA and/or kV according to patient size and/or use of iterative reconstruction technique. COMPARISON:  Same-day x-ray FINDINGS: Bones/Joint/Cartilage Acute comminuted intertrochanteric fracture of the proximal left femur. Mild superior migration of the distal fracture component with near 90 degrees of varus angulation. Greater trochanteric fragment is mildly displaced posteriorly. Nondisplaced lesser trochanteric component. Left hip joint alignment is maintained without dislocation. Severe osteoarthritis of the left hip. Visualized portion of the left hemipelvis is intact without fracture or diastasis. No lytic or sclerotic bone lesion is identified. Ligaments Suboptimally assessed by CT. Muscles and Tendons No acute musculotendinous abnormality by CT. Soft tissues Mild soft tissue swelling at the lateral aspect of the hip. No organized fluid collection or hematoma. Atherosclerotic vascular calcifications. Scattered colonic diverticulosis. Multiple calcified uterine fibroids. IMPRESSION: 1. Acute comminuted intertrochanteric fracture of the proximal left femur with varus angulation. 2. Severe osteoarthritis of the left hip. Electronically Signed   By: Davina Poke D.O.   On: 07/08/2022 19:34   CT Cervical Spine Wo Contrast  Result Date: 07/08/2022 CLINICAL DATA:  Fall.  Neck trauma EXAM: CT CERVICAL SPINE WITHOUT CONTRAST TECHNIQUE: Multidetector CT imaging of the cervical spine was performed without intravenous contrast.  Multiplanar CT image reconstructions were also generated. RADIATION DOSE REDUCTION: This exam was performed according to the departmental dose-optimization program which includes automated exposure control, adjustment of the mA and/or kV according to patient size and/or use of iterative reconstruction technique. COMPARISON:  None Available. FINDINGS: Alignment: Facet joints are aligned without dislocation or traumatic listhesis. Dens and lateral masses are aligned. Skull base and vertebrae: No acute fracture. No primary bone lesion or focal pathologic process. Soft tissues and spinal canal: No prevertebral fluid or swelling. No visible canal hematoma. Disc levels: Mild multilevel disc height loss with uncovertebral spurring. Advanced multilevel bilateral facet arthropathy. Upper chest: Negative. Other: Bilateral carotid atherosclerosis. IMPRESSION: 1. No acute fracture or traumatic listhesis of the cervical spine. 2. Facet-predominant multilevel cervical spondylosis. Electronically Signed   By: Davina Poke D.O.   On: 07/08/2022 19:31   CT HEAD WO CONTRAST (5MM)  Result Date: 07/08/2022 CLINICAL DATA:  Head trauma, minor (Age >= 65y) EXAM: CT HEAD WITHOUT CONTRAST TECHNIQUE: Contiguous axial images were obtained from the base of the skull through the vertex without intravenous contrast. RADIATION DOSE REDUCTION: This exam was performed according to the departmental dose-optimization program which includes automated exposure control, adjustment of the mA and/or kV according to patient size and/or use of iterative reconstruction technique. COMPARISON:  11/01/2021 FINDINGS: Brain: There is periventricular white matter decreased attenuation consistent with small vessel ischemic changes. Ventricles, sulci and cisterns are prominent consistent with age related involutional changes. No acute intracranial hemorrhage, mass effect or shift. No hydrocephalus. Vascular: No hyperdense vessel or unexpected calcification.  Skull: Normal. Negative for fracture or focal lesion. Sinuses/Orbits: No acute finding. IMPRESSION: Atrophy and chronic small vessel ischemic changes. No acute intracranial process identified. Electronically Signed   By: Sammie Bench M.D.   On: 07/08/2022 19:28   DG Hip Unilat W or Wo Pelvis 2-3 Views Left  Result Date: 07/08/2022 CLINICAL DATA:  Left hip pain after fall EXAM: DG HIP (WITH OR WITHOUT PELVIS) 2-3V LEFT COMPARISON:  None Available. FINDINGS: Th acute intertrochanteric fracture of the  left femur. There is slight lateral and superior displacement of the distal fragment. Probable additional fracture of the lesser trochanter. The left femoral head remains located within the acetabulum. Uterine fibroids. IMPRESSION: Left intertrochanteric femur fracture. Electronically Signed   By: Placido Sou M.D.   On: 07/08/2022 18:56   DG Chest 1 View  Result Date: 07/08/2022 CLINICAL DATA:  [place] onto left side, left hip fracture, preoperative evaluation EXAM: CHEST  1 VIEW COMPARISON:  11/01/2021 FINDINGS: Single frontal view of the chest demonstrates a stable cardiac silhouette. Stable ectasia of the thoracic aorta. No airspace disease, effusion, or pneumothorax. There are no acute bony abnormalities. IMPRESSION: 1. No acute intrathoracic process. Electronically Signed   By: Randa Ngo M.D.   On: 07/08/2022 18:55    Procedures Procedures    Medications Ordered in ED Medications  oxyCODONE-acetaminophen (PERCOCET/ROXICET) 5-325 MG per tablet 1 tablet (1 tablet Oral Given 07/08/22 1703)  fentaNYL (SUBLIMAZE) injection 25 mcg (25 mcg Intravenous Given 07/08/22 2021)    ED Course/ Medical Decision Making/ A&P Clinical Course as of 07/08/22 2123  Wed Jul 08, 2022  1959 I spoke with Dr Erlinda Hong of orthopedics who recommended admission to medicine and likely OR Friday due to Eliquis. Patient is pending labs prior to admission. [VK]  2120 Labs within normal range, hospitalist will be consulted  for admission. [VK]    Clinical Course User Index [VK] Kemper Durie, DO                           Medical Decision Making This patient presents to the ED with chief complaint(s) of fall, L hip pain with pertinent past medical history of a fib on Eliqius, CHF, CKD, RA which further complicates the presenting complaint. The complaint involves an extensive differential diagnosis and also carries with it a high risk of complications and morbidity.    The differential diagnosis includes due to patient's fall on thinners, concern for ICH, mass effect, cervical spine fracture, hip fracture, no other traumatic injuries seen on exam, patient denies any presyncopal symptoms making a syncopal fall unlikely  Additional history obtained: Additional history obtained from family Records reviewed outpatient cardiology records  ED Course and Reassessment: Patient was initially evaluated by provider in triage and had CT head/C-spine and left hip x-ray performed.  Hip x-ray did show evidence of a intertrochanteric fracture.  She will have a chest x-ray and hip CT done for surgical planning.  Patient denies any syncopal symptoms making a syncopal fall unlikely.  Patient will be given pain control.  Orthopedics will be consulted with plan for likely admission for OR.  Independent labs interpretation:  The following labs were independently interpreted: At patient's baseline  Independent visualization of imaging: - I independently visualized the following imaging with scope of interpretation limited to determining acute life threatening conditions related to emergency care: CTH/C-spine, L hip xr, Chest XR, which revealed L intertrochanteric fracture, no other acute traumatic injuries  Consultation: - Consulted or discussed management/test interpretation w/ external professional: Orthopedics, hospitalist  Consideration for admission or further workup: Patient requires admission for further management of  her hip fracture Social Determinants of health: N/A    Amount and/or Complexity of Data Reviewed Labs: ordered. Radiology: ordered.  Risk Prescription drug management. Decision regarding hospitalization.          Final Clinical Impression(s) / ED Diagnoses Final diagnoses:  Closed fracture of left hip, initial encounter [facility])    Rx /  DC Orders ED Discharge Orders     None         Kemper Durie, [place] 07/08/22 2123

## 2022-07-08 NOTE — ED Provider Triage Note (Signed)
Emergency Medicine Provider Triage Evaluation Note  Tracy Nelson , a 76 y.o. female  was evaluated in triage.  Pt complains of falling onto her left side earlier today. She states she tripped on the threshhold and fell onto her left side.   She states she did not hit her head. No neck pain.   States no LOC  Review of Systems  Positive: Fall, L hip pain Negative: Fever   Physical Exam  BP 110/83 (BP Location: Right Arm)   Pulse 79   Temp 98.7 F (37.1 C) (Oral)   Resp 18   Ht '5\' 1"'$  (1.549 m)   Wt 42.5 kg   SpO2 100%   BMI 17.70 kg/m  Gen:   Awake, no distress   Resp:  Normal effort  MSK:   Moves extremities without difficulty  Other:  Moves upper extremities well. Sensation intact throughout. TTP of L hip.   Symetric smile, Grips, sensation.   No CTL spine TTP.   Medical Decision Making  Medically screening exam initiated at 4:56 PM.  Appropriate orders placed.  Tracy Nelson was informed that the remainder of the evaluation will be completed by another provider, this initial triage assessment does not replace that evaluation, and the importance of remaining in the ED until their evaluation is complete.  Xray L hip +pelvis CT head /Cspine given age/eliquis.   No head injury though.   Tedd Sias, [place] 07/08/22 1700

## 2022-07-08 NOTE — H&P (Addendum)
History and Physical  Tracy Nelson HYQ:[id number] DOB: 20-Dec-1945 DOA: 07/08/2022  Referring physician: Dr. Maylon Peppers, [place]  PCP: [name], [place]  Outpatient Specialists: Cardiology. Patient coming from: Home  Chief Complaint:  Fall.    HPI: Tracy Nelson is a 76 y.o. female with medical history significant for HFrEF<20%, nonischemic cardiomyopathy, rheumatoid arthritis on immunosuppressants, GERD, CKD IV, MGUS, who presented to [facility] ED from home after a mechanical fall.  The patient tripped and landed on her left hip.  Was in her usual state of health prior to this.  Last dose of home Eliquis was taken after 3 PM.  After her fall, she took all her home medications.  In the ED, imaging revealed left intertrochanteric fracture, no other injuries.  EDP discussed the case with orthopedic surgery.  Plan for orthopedic surgery possibly on Friday after Eliquis, DOA, washout.  The patient received opiate-based analgesics which induced some nausea.  Also received IV antiemetics in the ED.  TRH, hospitalist service was asked to admit.  The patient and family request cardiology consult for surgical clearance.  ED Course: Tmax 98.7.  BP 110/83, pulse 79, respiration rate 18, O2 saturation 100% on room air.  Lab studies remarkable for serum bicarb 20, BUN 69, creatinine 3.33 above her baseline to 2.4 with GFR of 14.  BNP 232.  Hemoglobin 10.9.  Review of Systems: Review of systems as noted in the HPI. All other systems reviewed and are negative.   Past Medical History:  Diagnosis Date   Acid reflux 07/15/2020   Arthritis 07/15/2020   CHF (congestive heart failure) ([place])    Colitis 07/15/2020   Dilated cardiomyopathy ([place]) 07/15/2020   HFrEF (heart failure with reduced ejection fraction) ([place]) 07/15/2020   HTN (hypertension) 07/15/2020   Osteoporosis 07/15/2020   Palpitations 07/15/2020   Rheumatoid arthritis ([place]) 07/15/2020   Past Surgical History:  Procedure Laterality Date   CARDIOVERSION  N/A 11/08/2020   Procedure: CARDIOVERSION;  Surgeon: [name], MD;  Location: [place];  Service: Cardiovascular;  Laterality: N/A;   RIGHT HEART CATH N/A 08/14/2020   Procedure: RIGHT HEART CATH;  Surgeon: [name], MD;  Location: [place] CV LAB;  Service: Cardiovascular;  Laterality: N/A;   TEE WITHOUT CARDIOVERSION N/A 11/08/2020   Procedure: TRANSESOPHAGEAL ECHOCARDIOGRAM (TEE);  Surgeon: [name], MD;  Location: [facility] ENDOSCOPY;  Service: Cardiovascular;  Laterality: N/A;    Social History:  reports that she has never smoked. She has never used smokeless tobacco. She reports that she does not drink alcohol and does not use drugs.   Allergies  Allergen Reactions   Baclofen Nausea And Vomiting   Penicillin G     Other reaction(s): Unknown   Penicillins Rash    Family History  Problem Relation Age of Onset   Hypertension Mother    Diabetes Mother    Hypertension Father    Diabetes Father    Breast cancer Maternal Aunt    Breast cancer Paternal Aunt    Arthritis Maternal Grandmother    Lung disease Paternal Grandfather    Cancer Brother       Prior to Admission medications   Medication Sig Start Date End Date Taking? Authorizing Provider  amiodarone (PACERONE) 200 MG tablet TAKE 1 TABLET BY MOUTH EVERY DAY 12/19/21   [name], MD  azaTHIOprine (IMURAN) 50 MG tablet TAKE 2 TABLETS BY MOUTH EVERY DAY 04/20/22   Rice, Resa Miner, MD  bumetanide (BUMEX) 2 MG tablet Take 1  tablet (2 mg total) by mouth daily. 02/06/22   [name], MD  Cholecalciferol (VITAMIN D3) 25 MCG (1000 UT) CAPS Take 1 capsule by mouth daily in the afternoon.    [provider]  cholestyramine (QUESTRAN) 4 g packet Take 1 packet (4 g total) by mouth daily. 08/08/20   [name], MD  cyclobenzaprine (FLEXERIL) 10 MG tablet Take 1 tablet (10 mg total) by mouth 3 (three) times daily as needed for muscle spasms. 12/29/21   [name], [name]  ELIQUIS 2.5 MG  TABS tablet TAKE 1 TABLET BY MOUTH TWICE A DAY 12/31/21   [name], MD  Ensure (ENSURE) Take 237 mLs by mouth 2 (two) times daily between meals.    [provider]  folic acid (FOLVITE) 1 MG tablet TAKE 1 TABLET BY MOUTH EVERY DAY 07/30/21   [name], MD  hydroxychloroquine (PLAQUENIL) 200 MG tablet TAKE 1 TABLET BY MOUTH EVERY DAY 05/29/22   [name], MD  hydroxypropyl methylcellulose / hypromellose (ISOPTO TEARS / GONIOVISC) 2.5 % ophthalmic solution Place 1 drop into both eyes as needed for dry eyes.    [provider]  lidocaine (LIDODERM) 5 % Place 1 patch onto the skin daily. Remove & Discard patch within 12 hours or as directed by MD 03/09/22   [name], [name]  Menthol, Topical Analgesic, (ICY HOT BACK EX) Apply 1 application topically daily as needed (pain).    [provider]  midodrine (PROAMATINE) 5 MG tablet Take 1 tablet (5 mg total) by mouth 3 (three) times daily with meals. 02/04/22   [name], MD  MITIGARE 0.6 MG CAPS Take 0.6 mg by mouth daily as needed (gout). 07/18/20   [provider]  Multiple Vitamin (MULTIVITAMIN) tablet Take 1 tablet by mouth daily.    [provider]  omeprazole (PRILOSEC) 40 MG capsule Take 40 mg by mouth daily.    [provider]  polyethylene glycol (MIRALAX / GLYCOLAX) 17 g packet Take 17 g by mouth as needed.    [provider]  predniSONE (DELTASONE) 5 MG tablet TAKE 1 TABLET BY MOUTH EVERY DAY WITH BREAKFAST 05/04/22   Rice, Resa Miner, MD  Vericiguat (VERQUVO) 10 MG TABS Take 10 mg by mouth daily. 09/03/21   [name], MD    Physical Exam: BP 110/83 (BP Location: Right Arm)   Pulse 79   Temp 98.7 F (37.1 C) (Oral)   Resp 18   Ht '5\' 1"'$  (1.549 m)   Wt 42.5 kg   SpO2 100%   BMI 17.70 kg/m   General: 76 y.o. year-old female well developed well nourished in no acute distress.  Alert and oriented x3. Cardiovascular: Regular rate and rhythm  with no rubs or gallops.  No thyromegaly or JVD noted.  No lower extremity edema. 2/4 pulses in all 4 extremities. Respiratory: Clear to auscultation with no wheezes or rales. Good inspiratory effort. Abdomen: Soft nontender nondistended with normal bowel sounds x4 quadrants. Muskuloskeletal: No cyanosis, clubbing or edema noted bilaterally.  Left lower extremity externally rotated. Neuro: CN II-XII intact, strength, sensation, reflexes Skin: No ulcerative lesions noted or rashes Psychiatry: Judgement and insight appear normal. Mood is appropriate for condition and setting          Labs on Admission:  Basic Metabolic Panel: Recent Labs  Lab 07/07/22 1515 07/08/22 2015  NA 139 141  K 4.6 4.5  CL 109 108  CO2 18* 20*  GLUCOSE  104* 119*  BUN 65* 69*  CREATININE 3.35* 3.33*  CALCIUM 9.5 9.6   Liver Function Tests: Recent Labs  Lab 07/07/22 1515  AST 30  ALT 32  ALKPHOS 56  BILITOT 0.6  PROT 6.3*  ALBUMIN 3.2*   No results for input(s): "LIPASE", "AMYLASE" in the last 168 hours. No results for input(s): "AMMONIA" in the last 168 hours. CBC: Recent Labs  Lab 07/07/22 1515 07/08/22 0807 07/08/22 2015  WBC 11.3*  --  10.4  NEUTROABS  --   --  9.1*  HGB 10.5* 10.7* 10.9*  HCT 33.6*  --  36.0  MCV 94.4  --  95.7  PLT 277  --  246   Cardiac Enzymes: No results for input(s): "CKTOTAL", "CKMB", "CKMBINDEX", "TROPONINI" in the last 168 hours.  BNP (last 3 results) Recent Labs    11/06/21 1241 03/04/22 1629 07/07/22 1515  BNP 190.3* 193.6* 232.0*    ProBNP (last 3 results) No results for input(s): "PROBNP" in the last 8760 hours.  CBG: No results for input(s): "GLUCAP" in the last 168 hours.  Radiological Exams on Admission: CT Hip Left Wo Contrast  Result Date: 07/08/2022 CLINICAL DATA:  Left hip fracture EXAM: CT OF THE LEFT HIP WITHOUT CONTRAST TECHNIQUE: Multidetector CT imaging of the left hip was performed according to the standard protocol. Multiplanar  CT image reconstructions were also generated. RADIATION DOSE REDUCTION: This exam was performed according to the departmental dose-optimization program which includes automated exposure control, adjustment of the mA and/or kV according to patient size and/or use of iterative reconstruction technique. COMPARISON:  Same-day x-ray FINDINGS: Bones/Joint/Cartilage Acute comminuted intertrochanteric fracture of the proximal left femur. Mild superior migration of the distal fracture component with near 90 degrees of varus angulation. Greater trochanteric fragment is mildly displaced posteriorly. Nondisplaced lesser trochanteric component. Left hip joint alignment is maintained without dislocation. Severe osteoarthritis of the left hip. Visualized portion of the left hemipelvis is intact without fracture or diastasis. No lytic or sclerotic bone lesion is identified. Ligaments Suboptimally assessed by CT. Muscles and Tendons No acute musculotendinous abnormality by CT. Soft tissues Mild soft tissue swelling at the lateral aspect of the hip. No organized fluid collection or hematoma. Atherosclerotic vascular calcifications. Scattered colonic diverticulosis. Multiple calcified uterine fibroids. IMPRESSION: 1. Acute comminuted intertrochanteric fracture of the proximal left femur with varus angulation. 2. Severe osteoarthritis of the left hip. Electronically Signed   By: Davina Poke D.O.   On: 07/08/2022 19:34   CT Cervical Spine Wo Contrast  Result Date: 07/08/2022 CLINICAL DATA:  Fall.  Neck trauma EXAM: CT CERVICAL SPINE WITHOUT CONTRAST TECHNIQUE: Multidetector CT imaging of the cervical spine was performed without intravenous contrast. Multiplanar CT image reconstructions were also generated. RADIATION DOSE REDUCTION: This exam was performed according to the departmental dose-optimization program which includes automated exposure control, adjustment of the mA and/or kV according to patient size and/or use of  iterative reconstruction technique. COMPARISON:  None Available. FINDINGS: Alignment: Facet joints are aligned without dislocation or traumatic listhesis. Dens and lateral masses are aligned. Skull base and vertebrae: No acute fracture. No primary bone lesion or focal pathologic process. Soft tissues and spinal canal: No prevertebral fluid or swelling. No visible canal hematoma. Disc levels: Mild multilevel disc height loss with uncovertebral spurring. Advanced multilevel bilateral facet arthropathy. Upper chest: Negative. Other: Bilateral carotid atherosclerosis. IMPRESSION: 1. No acute fracture or traumatic listhesis of the cervical spine. 2. Facet-predominant multilevel cervical spondylosis. Electronically Signed   By: Davina Poke  D.O.   On: 07/08/2022 19:31   CT HEAD WO CONTRAST (5MM)  Result Date: 07/08/2022 CLINICAL DATA:  Head trauma, minor (Age >= 65y) EXAM: CT HEAD WITHOUT CONTRAST TECHNIQUE: Contiguous axial images were obtained from the base of the skull through the vertex without intravenous contrast. RADIATION DOSE REDUCTION: This exam was performed according to the departmental dose-optimization program which includes automated exposure control, adjustment of the mA and/or kV according to patient size and/or use of iterative reconstruction technique. COMPARISON:  11/01/2021 FINDINGS: Brain: There is periventricular white matter decreased attenuation consistent with small vessel ischemic changes. Ventricles, sulci and cisterns are prominent consistent with age related involutional changes. No acute intracranial hemorrhage, mass effect or shift. No hydrocephalus. Vascular: No hyperdense vessel or unexpected calcification. Skull: Normal. Negative for fracture or focal lesion. Sinuses/Orbits: No acute finding. IMPRESSION: Atrophy and chronic small vessel ischemic changes. No acute intracranial process identified. Electronically Signed   By: Sammie Bench M.D.   On: 07/08/2022 19:28   DG Hip  Unilat W or Wo Pelvis 2-3 Views Left  Result Date: 07/08/2022 CLINICAL DATA:  Left hip pain after fall EXAM: DG HIP (WITH OR WITHOUT PELVIS) 2-3V LEFT COMPARISON:  None Available. FINDINGS: Th acute intertrochanteric fracture of the left femur. There is slight lateral and superior displacement of the distal fragment. Probable additional fracture of the lesser trochanter. The left femoral head remains located within the acetabulum. Uterine fibroids. IMPRESSION: Left intertrochanteric femur fracture. Electronically Signed   By: Placido Sou M.D.   On: 07/08/2022 18:56   DG Chest 1 View  Result Date: 07/08/2022 CLINICAL DATA:  [place] onto left side, left hip fracture, preoperative evaluation EXAM: CHEST  1 VIEW COMPARISON:  11/01/2021 FINDINGS: Single frontal view of the chest demonstrates a stable cardiac silhouette. Stable ectasia of the thoracic aorta. No airspace disease, effusion, or pneumothorax. There are no acute bony abnormalities. IMPRESSION: 1. No acute intrathoracic process. Electronically Signed   By: Randa Ngo M.D.   On: 07/08/2022 18:55    EKG: I independently viewed the EKG done and my findings are as followed: Normal sinus rhythm rate of 79.  Nonspecific ST changes.  QTc 419.  Assessment/Plan Present on Admission:  Closed left hip fracture, initial encounter [place])  Principal Problem:   Closed left hip fracture, initial encounter ([place])  Closed left hip fracture, POA On Eliquis for paroxysmal atrial fibrillation. DOA, Eliquis, on hold. Pain control with bowel regimen Possible orthopedic surgery on Friday, July 10, 2022. Routine consult to cardiology, Dr. Marcelle Smiling, via Secure chat for cardiac clearance prior to surgery.  AKI on CKD 4 Baseline creatinine appears to be 2.4 with GFR of 20. Presented with creatinine of 3.33 with GFR 14 Closely monitor urine output with strict I's and O's. Repeat renal panel in the morning  HFrEF less than 20% Followed by cardiology  Dr. Darral Dash on exam Strict I's and O's and daily weight  Paroxysmal A-fib on Eliquis Continue to hold off on Eliquis, last dose was taken after 3 PM. Rate is currently controlled Monitor on telemetry  Rheumatoid arthritis Immunosuppressants currently on hold. Resume when okay with orthopedic surgery.  GERD Resume home regimen.  History of hypotension Home midodrine. Monitor vital signs. Maintain MAP greater than 65     DVT prophylaxis: SCDs  Code Status: Full code  Family Communication: Updated her daughter at bedside.  Disposition Plan: Admitted to telemetry surgical unit  Consults called: Orthopedic surgery, cardiology.  Admission status: Inpatient status.   Status  is: Inpatient The patient requires at least 2 midnights for further evaluation and treatment of present condition.   Kayleen Memos MD [facility] Pager [phone]  If 7PM-7AM, please contact night-coverage [email or web address] Password [facility]  07/08/2022, 9:34 PM

## 2022-07-08 NOTE — Progress Notes (Signed)
Consulted received from EDP this evening.  Patient has an intertrochanteric hip fracture.  She has multiple co morbidities with a significant cardiac history. Tentative plan is for surgical repair on Friday due to patient recently taking eliquis and pending medical optimization.  Will need preop risk assessment and optimization from cardiology and medicine service.  Full consult to follow in the morning.  Azucena Cecil, MD [facility] 10:18 PM

## 2022-07-08 NOTE — Progress Notes (Signed)
Advanced Heart Failure Clinic Progress Note   PCP: [name], [place] Nephrology: Dr. Royce Macadamia HF Cardiologist: Dr. Aundra Dubin  76 y.o. with history of rheumatoid arthritis, CKD stage 3, and nonischemic cardiomyopathy was referred by Dr. Andrey Cota in [place] to establish heart failure care in [place].  Patient has been known to have a cardiomyopathy since 2015.  Cardiac MRI in 2015 showed EF 37% with no LGE.  Coronary angiography at that time showed no significant coronary disease.  Over the next few years, LV EF fluctuated up and down.  In 3/21, echo showed EF down to 20-25%.  She was admitted to the hospital in [place], [place] in 11/21 with CHF exacerbation.  Echo showed EF 20-25%, global hypokinesis, mildly decreased RV systolic function.  Creatinine was noted to be elevated. She was diuresed and sent home, but was readmitted later in 11/21 with ongoing volume overload. Creatinine was up to 2.89.  She was taken off most of her cardiac meds due to soft BP and elevated creatinine. She was discharged to her daughter's house in [place].   I repeated an echo on her in 12/21, EF < 20% with moderate LV dilation and mildly decreased RV systolic function. RHC was done, surprisingly showing low filling pressures and relatively preserved cardiac index at 2.22.   She was admitted in 3/22 with atrial fibrillation, converted to NSR on amiodarone. She went into atrial flutter after this and had TEE-DCCV in 4/22.  TEE showed EF < 20%, moderate LV dilation, mild RV dilation with moderate RV dysfunction, moderate central MR.  CPX in 3/22 showed severe HF limitation.  PYP scan 4/22 was grade 1 with H/CL 1.48, equivocal.  PYP scan repeated in 3/23 and likely negative.   She was evaluated for [facility] or ANTHEM, BNP too high and thought to be too frail.   Cardiac MRI in 10/22 showed mild LV dilation with EF 23%, normal RV size with RVEF 37%, ECV 34%, small area of subendocardial LGE in the mid inferolateral  wall and small area of full thickness LGE in the basal  inferior wall.   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    Follow up 6/23, BP low and midodrine started. Discussed barostimulation activation therapy but no decision made. Felt to be near end-stage HF.  Today she returns for HF follow up with her son. She remains very weak.  She has periodic nausea and poor appetite, also complains of hiccups.  Weight is down 7 lbs.  She says that EGD has been considered but deferred due to cardiac risk.  She has episodes of dizziness, reports a spinning sensation when she lies back with her head to the side, sounds like positional vertigo.  She is very fatigued walking across the house.  Fatigued with ADLs.  Not very active. No palpitations. No chest pain.   ECG: NSR, LAFB, low voltage, septal Qs  Labs (12/21): K 5 => 3.7, creatinine 2.89 => 1.9 => 1.69, BNP 2904 Labs (1/22): K 4, creatinine 1.9, hgb 12.5 Labs (3/22): K 3.6, creatinine 2.36, hgb 11.6, TSH normal Labs (4/22): K 3.7, creatinine 2.25, LFTs normal, urine immunofixation normal, pro-BNP 6498 Labs (7/22): Myeloma panel with IgG monoclonal protein Labs (10/22): K 4.1, creatinine 1.96 Labs (12/22): BNP 549, TSH normal, K 4.3, creatinine 1.78 Labs (1/23): SCr 2.61, K 4.0, Hgb 11.4  Lab (3/23): Scr 2.29, K 3.9  Labs (6/23): K 4.1, creatinine 3.82, normal LFTs, normal TSH, hgb 9.4  Labs ((7/23): K 4.4, creatinine 2.4, LDL 95, BNP 193  PMH: 1. GERD 2. Rheumatoid arthritis: Followed by rheumatology in [place].  - No known pulmonary disease from RA.  3. Osteoporosis. 4. Gout 5. CKD stage 4 6. H/o SVT 7. Chronic systolic CHF: She was found to have nonischemic cardiomyopathy with low EF in 2015.  - LHC (2015): Normal coronaries.  - cardiac MRI (2015): EF 37%, no LGE - Echo (3/21): EF 20-25%. - Echo (11/21): EF 20-25%, moderate LV dilation, global hypokinesis, biatrial enlargement, mildly decreased  RV systolic function.  - Echo (12/21): EF < 20%, moderate LV dilation, normal RV size/systolic function, moderate MR, IVC normal.  - RHC (1/22): Mean RA 1, PA 39/14, mean PCWP 10, PVR 3.9 WU, CI 2.22.  - CPX (3/22): peak VO2 10.1, VE/VCO2 55, RER 1.13.  Severe functional limitation due to HF.  - TEE (4/22): EF < 20%, moderate LV dilation, mild RV dilation with moderate RV dysfunction, moderate central MR. - PYP scan (4/22): grade 1 with H/CL 1.48. Equivocal. [employer] gene testing negative. - Cardiac MRI (10/22): mild LV dilation with EF 23%, normal RV size with RVEF 37%, ECV 34%, small area of subendocardial LGE in the mid inferolateral wall and small area of full thickness LGE in the basal inferior wall.  - PYP scan (3/23): grade 1, H/CL 1.3.  Probably negative.  8. Atrial fibrillation/flutter: TEE-DCCV in 4/22.  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  10. Varicella zoster with post-herpetic neuralgia 11. Fe deficiency anemia  SH: From [place] but moved to [place] to live with daughter.  Has 3 children.  Nonsmoker.  No ETOH.   FH: Mother with CHF, father with MI.   ROS: All systems reviewed and negative except as per HPI.   Current Outpatient Medications  Medication Sig Dispense Refill   amiodarone (PACERONE) 200 MG tablet TAKE 1 TABLET BY MOUTH EVERY DAY 60 tablet 6   azaTHIOprine (IMURAN) 50 MG tablet TAKE 2 TABLETS BY MOUTH EVERY DAY 60 tablet 0   bumetanide (BUMEX) 2 MG tablet Take 1 tablet (2 mg total) by mouth daily. 90 tablet 3   Cholecalciferol (VITAMIN D3) 25 MCG (1000 UT) CAPS Take 1 capsule by mouth daily in the afternoon.     cholestyramine (QUESTRAN) 4 g packet Take 1 packet (4 g total) by mouth daily. 60 each 12   cyclobenzaprine (FLEXERIL) 10 MG tablet Take 1 tablet (10 mg total) by mouth 3 (three) times daily as needed for muscle spasms. 30 tablet 0   ELIQUIS 2.5 MG TABS tablet TAKE 1 TABLET BY MOUTH TWICE A DAY 60 tablet 5   Ensure (ENSURE)  Take 237 mLs by mouth 2 (two) times daily between meals.     folic acid (FOLVITE) 1 MG tablet TAKE 1 TABLET BY MOUTH EVERY DAY 30 tablet 11   hydroxychloroquine (PLAQUENIL) 200 MG tablet TAKE 1 TABLET BY MOUTH EVERY DAY 30 tablet 5   hydroxypropyl methylcellulose / hypromellose (ISOPTO TEARS / GONIOVISC) 2.5 % ophthalmic solution Place 1 drop into both eyes as needed for dry eyes.     lidocaine (LIDODERM) 5 % Place 1 patch onto the skin daily. Remove & Discard patch within 12 hours or as directed by MD 30 patch 0   Menthol, Topical Analgesic, (ICY HOT BACK EX) Apply 1 application topically daily as needed (pain).     midodrine (PROAMATINE) 5 MG tablet Take 1 tablet (5 mg total) by mouth 3 (three) times daily  with meals. 180 tablet 3   MITIGARE 0.6 MG CAPS Take 0.6 mg by mouth daily as needed (gout).     Multiple Vitamin (MULTIVITAMIN) tablet Take 1 tablet by mouth daily.     omeprazole (PRILOSEC) 40 MG capsule Take 40 mg by mouth daily.     polyethylene glycol (MIRALAX / GLYCOLAX) 17 g packet Take 17 g by mouth as needed.     predniSONE (DELTASONE) 5 MG tablet TAKE 1 TABLET BY MOUTH EVERY DAY WITH BREAKFAST 30 tablet 1   Vericiguat (VERQUVO) 10 MG TABS Take 10 mg by mouth daily. 90 tablet 3   No current facility-administered medications for this encounter.   Facility-Administered Medications Ordered in Other Encounters  Medication Dose Route Frequency Provider Last Rate Last Admin   epoetin alfa-epbx (RETACRIT) 03546 UNIT/ML injection            epoetin alfa-epbx (RETACRIT) injection 10,000 Units  10,000 Units Subcutaneous Q14 Days Claudia Desanctis, MD   10,000 Units at 07/08/22 0808   Wt Readings from Last 3 Encounters:  07/07/22 42.5 kg (93 lb 12.8 oz)  04/30/22 44.1 kg (97 lb 3.2 oz)  03/09/22 46.3 kg (102 lb)   BP 102/60   Pulse 83   Wt 42.5 kg (93 lb 12.8 oz)   SpO2 100%   BMI 17.72 kg/m  General: NAD, frail Neck: No JVD, no thyromegaly or thyroid nodule.  Lungs: Clear to  auscultation bilaterally with normal respiratory effort. CV: Nondisplaced PMI.  Heart regular S1/S2, no S3/S4, no murmur.  No peripheral edema.  No carotid bruit.  Normal pedal pulses.  Abdomen: Soft, nontender, no hepatosplenomegaly, no distention.  Skin: Intact without lesions or rashes.  Neurologic: Alert and oriented x 3.  Psych: Normal affect. Extremities: No clubbing or cyanosis.  HEENT: Normal.   Assessment/Plan: 1. Chronic systolic CHF: Nonischemic cardiomyopathy.  This has been known since 2015, cath in 2015 showed no coronary disease and cardiac MRI in 2015 showed no LGE.  Cause is uncertain, familial cardiomyopathy is a concern given nonischemic cardiomyopathy in her mother.  Cannot rule out remote viral myocarditis.Poor appetite and severe HF limitation on CPX in 3/22 is concerning, as is elevated creatinine (suspect cardiorenal syndrome).  Low BP and elevated creatinine have limited her cardiac med regimen. RHC (1/22) showed low filling pressures and actually a relatively preserved cardiac index of 2.22.  PYP scan in 4/22 was equivocal, PYP scan repeated in 3/23 and likely negative.  Cardiac MRI in 10/22 showed mild LV dilation with EF 23%, normal RV size with RVEF 37%, ECV 34%, small area of subendocardial LGE in the mid inferolateral wall and small area of full thickness LGE in the basal inferior wall.   This was not suggestive of cardiac amyloidosis but possibly could be consistent with coronary embolism (though no LV thrombus visualized) versus prior myocarditis versus sarcoidosis. She does not appear volume overloaded on exam.  NYHA class IIIb chronically. She is quite frail. GDMT limited by renal dysfunction and hypotension.  - Continue midodrine 5 mg tid. BP stable today.  - Continue bumetanide 2 mg daily. BMET/BNP today.  - Continue Verquvo 10 mg daily.  - Off dapagliflozin with rise in creatinine.   - Off spironolactone with elevated creatinine and soft BP.   - Though RHC in  1/22 was not markedly abnormal, CPX in 3/22 showed severe functional limitation due to HF and BP also runs quite low.  I am worried that she is nearing end-stage.  She  is frail with CKD stage 4, I do not think that she would be a candidate for LVAD.  - With renal failure, CHF, and atrial arrhythmias, we have assessed for cardiac amyloidosis.  PYP scan was equivocal in 4/22 for transthyretin cardiac amyloidosis.  It was repeated in 3/23 and likely negative.  [employer] gene testing for TTR mutations was negative. The cardiac MRI was not consistent with amyloidosis.  She has MGUS with monoclonal IgG paraprotein, but based on cMRI and slow progression, think AL amyloidosis is unlikely.   - She is interested in barostimulation activation therapy, will have nurse discuss with her.   - Does not have an ICD, would not place given NICM, advanced age, and concern for nearing end stage HF.  2. CKD: Stage 4.  She is followed by Dr. Royce Macadamia. - Check BMET today  3. Rheumatoid arthritis: No history of lung involvement. She has been on a low dose of prednisone chronically.  4. Atrial fibrillation/flutter: S/p TEE-DCCV in 4/22.  She is now on amiodarone and in NSR.  - Continue amiodarone 200 mg daily. Check LFTs and TSH.  Will need regular eye exam.  - Continue Eliquis 2.5 mg bid (elevated creatinine, low weight).  5. 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   Follow up in 2 months with APP.   Loralie Champagne  07/08/2022

## 2022-07-09 ENCOUNTER — Encounter (HOSPITAL_COMMUNITY): Payer: Self-pay | Admitting: Internal Medicine

## 2022-07-09 ENCOUNTER — Telehealth (HOSPITAL_COMMUNITY): Payer: Self-pay

## 2022-07-09 ENCOUNTER — Other Ambulatory Visit: Payer: Self-pay

## 2022-07-09 DIAGNOSIS — I5022 Chronic systolic (congestive) heart failure: Secondary | ICD-10-CM | POA: Diagnosis not present

## 2022-07-09 DIAGNOSIS — Z01810 Encounter for preprocedural cardiovascular examination: Secondary | ICD-10-CM | POA: Diagnosis not present

## 2022-07-09 DIAGNOSIS — N184 Chronic kidney disease, stage 4 (severe): Secondary | ICD-10-CM | POA: Insufficient documentation

## 2022-07-09 DIAGNOSIS — S72002A Fracture of unspecified part of neck of left femur, initial encounter for closed fracture: Secondary | ICD-10-CM | POA: Diagnosis not present

## 2022-07-09 LAB — CBC
HCT: 31.2 % — ABNORMAL LOW (ref 36.0–46.0)
Hemoglobin: 9.8 g/dL — ABNORMAL LOW (ref 12.0–15.0)
MCH: 29.5 pg (ref 26.0–34.0)
MCHC: 31.4 g/dL (ref 30.0–36.0)
MCV: 94 fL (ref 80.0–100.0)
Platelets: 228 10*3/uL (ref 150–400)
RBC: 3.32 MIL/uL — ABNORMAL LOW (ref 3.87–5.11)
RDW: 16.7 % — ABNORMAL HIGH (ref 11.5–15.5)
WBC: 11 10*3/uL — ABNORMAL HIGH (ref 4.0–10.5)
nRBC: 0 % (ref 0.0–0.2)

## 2022-07-09 LAB — RENAL FUNCTION PANEL
Albumin: 3.1 g/dL — ABNORMAL LOW (ref 3.5–5.0)
Anion gap: 10 (ref 5–15)
BUN: 65 mg/dL — ABNORMAL HIGH (ref 8–23)
CO2: 18 mmol/L — ABNORMAL LOW (ref 22–32)
Calcium: 9 mg/dL (ref 8.9–10.3)
Chloride: 112 mmol/L — ABNORMAL HIGH (ref 98–111)
Creatinine, Ser: 3.36 mg/dL — ABNORMAL HIGH (ref 0.44–1.00)
GFR, Estimated: 14 mL/min — ABNORMAL LOW (ref >60)
Glucose, Bld: 129 mg/dL — ABNORMAL HIGH (ref 70–99)
Phosphorus: 5.1 mg/dL — ABNORMAL HIGH (ref 2.5–4.6)
Potassium: 4.7 mmol/L (ref 3.5–5.1)
Sodium: 140 mmol/L (ref 135–145)

## 2022-07-09 LAB — MAGNESIUM: Magnesium: 2.1 mg/dL (ref 1.7–2.4)

## 2022-07-09 LAB — GLUCOSE, CAPILLARY: Glucose-Capillary: 173 mg/dL — ABNORMAL HIGH (ref 70–99)

## 2022-07-09 MED ORDER — VERICIGUAT 10 MG PO TABS
10.0000 mg | ORAL_TABLET | Freq: Every day | ORAL | Status: DC
  Administered 2022-07-12 – 2022-07-15 (×4): 10 mg via ORAL
  Filled 2022-07-09 (×5): qty 1

## 2022-07-09 MED ORDER — HYDROXYCHLOROQUINE SULFATE 200 MG PO TABS
200.0000 mg | ORAL_TABLET | Freq: Every day | ORAL | Status: DC
  Administered 2022-07-09 – 2022-07-15 (×6): 200 mg via ORAL
  Filled 2022-07-09 (×6): qty 1

## 2022-07-09 MED ORDER — CEFAZOLIN SODIUM-DEXTROSE 1-4 GM/50ML-% IV SOLN
1.0000 g | INTRAVENOUS | Status: AC
  Administered 2022-07-10: 1 g via INTRAVENOUS
  Filled 2022-07-09 (×2): qty 50

## 2022-07-09 MED ORDER — TRANEXAMIC ACID-NACL 1000-0.7 MG/100ML-% IV SOLN
1000.0000 mg | INTRAVENOUS | Status: AC
  Administered 2022-07-10: 1000 mg via INTRAVENOUS
  Filled 2022-07-09 (×2): qty 100

## 2022-07-09 MED ORDER — POVIDONE-IODINE 10 % EX SWAB
2.0000 | Freq: Once | CUTANEOUS | Status: AC
  Administered 2022-07-10: 2 via TOPICAL

## 2022-07-09 MED ORDER — TRANEXAMIC ACID 1000 MG/10ML IV SOLN: 2000.0000 mg | INTRAVENOUS | Status: DC

## 2022-07-09 MED ORDER — PANTOPRAZOLE SODIUM 40 MG IV SOLR
40.0000 mg | Freq: Two times a day (BID) | INTRAVENOUS | Status: DC
  Administered 2022-07-09 – 2022-07-13 (×8): 40 mg via INTRAVENOUS
  Filled 2022-07-09 (×8): qty 10

## 2022-07-09 MED ORDER — CHOLESTYRAMINE 4 G PO PACK
4.0000 g | PACK | Freq: Every day | ORAL | Status: DC
  Administered 2022-07-09 – 2022-07-14 (×3): 4 g via ORAL
  Filled 2022-07-09 (×8): qty 1

## 2022-07-09 MED ORDER — PREDNISONE 5 MG PO TABS
5.0000 mg | ORAL_TABLET | Freq: Every day | ORAL | Status: DC
  Administered 2022-07-09 – 2022-07-15 (×7): 5 mg via ORAL
  Filled 2022-07-09 (×7): qty 1

## 2022-07-09 MED ORDER — AZATHIOPRINE 50 MG PO TABS
100.0000 mg | ORAL_TABLET | Freq: Every day | ORAL | Status: DC
  Administered 2022-07-09 – 2022-07-15 (×7): 100 mg via ORAL
  Filled 2022-07-09 (×7): qty 2

## 2022-07-09 MED ORDER — AMIODARONE HCL 200 MG PO TABS
200.0000 mg | ORAL_TABLET | Freq: Every day | ORAL | Status: DC
  Administered 2022-07-09 – 2022-07-15 (×8): 200 mg via ORAL
  Filled 2022-07-09 (×7): qty 1

## 2022-07-09 NOTE — Consult Note (Addendum)
Advanced Heart Failure Team Consult Note   Primary Physician: [name], [name] PCP-Cardiologist:  Dr. Aundra Dubin   Reason for Consultation: CV risk stratification/ surgical clearance  HPI:    Tracy Nelson is seen today for evaluation of CV risk stratification/ surgical clearance at the request of Dr. Erlinda Hong, Orthopedics.   76 y.o. with history of rheumatoid arthritis, CKD stage 3, and nonischemic cardiomyopathy was referred by Dr. Andrey Cota in [place] to establish heart failure care in [place].  Patient has been known to have a cardiomyopathy since 2015.  Cardiac MRI in 2015 showed EF 37% with no LGE.  Coronary angiography at that time showed no significant coronary disease.  Over the next few years, LV EF fluctuated up and down.  In 3/21, echo showed EF down to 20-25%.  She was admitted to the hospital in [place], [place] in 11/21 with CHF exacerbation.  Echo showed EF 20-25%, global hypokinesis, mildly decreased RV systolic function.  Creatinine was noted to be elevated. She was diuresed and sent home, but was readmitted later in 11/21 with ongoing volume overload. Creatinine was up to 2.89.  She was taken off most of her cardiac meds due to soft BP and elevated creatinine. She was discharged to her daughter's house in [place].    I repeated an echo on her in 12/21, EF < 20% with moderate LV dilation and mildly decreased RV systolic function. RHC was done, surprisingly showing low filling pressures and relatively preserved cardiac index at 2.22.    She was admitted in 3/22 with atrial fibrillation, converted to NSR on amiodarone. She went into atrial flutter after this and had TEE-DCCV in 4/22.  TEE showed EF < 20%, moderate LV dilation, mild RV dilation with moderate RV dysfunction, moderate central MR.  CPX in 3/22 showed severe HF limitation.   PYP scan 4/22 was grade 1 with H/CL 1.48, equivocal.  PYP scan repeated in 3/23 and likely negative.    She was evaluated for [facility] or  ANTHEM, BNP too high and thought to be too frail.    Cardiac MRI in 10/22 showed mild LV dilation with EF 23%, normal RV size with RVEF 37%, ECV 34%, small area of subendocardial LGE in the mid inferolateral wall and small area of full thickness LGE in the basal  inferior wall.    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     Follow up 6/23, BP low and midodrine started. Discussed barostimulation activation therapy but no decision made. Felt to be near end-stage HF.   Seen in [facility] 2 days ago, on 11/28. Still very weak w/ periodic nausea and poor appetite, also complained of hiccups.  Weight  down 7 lbs. Very fatigued walking across the house.  Fatigued with ADLs.  Not very active. No palpitations. No chest pain. Reported that EGD had been considered but deferred due to cardiac risk.   Now admitted after mechanical fall w/ subsequent left intertrochanteric fracture. Ortho has seen, planning possible IMN tomorrow. AHF team asked to see for CV risk stratification/ surgical clearance.  She is currently laying flat in bed. No resting dyspnea. Denies CP. In hip pain.    Review of Systems: [y] = yes, '[ ]'$  = no   General: Weight gain '[ ]'$ ; Weight loss '[ ]'$ ; Anorexia '[ ]'$ ; Fatigue '[ ]'$ ; Fever '[ ]'$ ; Chills '[ ]'$ ; Weakness [Y ]  Cardiac: Chest pain/pressure '[ ]'$ ; Resting SOB '[ ]'$ ; Exertional SOB [Y ];  Orthopnea '[ ]'$ ; Pedal Edema '[ ]'$ ; Palpitations '[ ]'$ ; Syncope '[ ]'$ ; Presyncope '[ ]'$ ; Paroxysmal nocturnal dyspnea'[ ]'$   Pulmonary: Cough '[ ]'$ ; Wheezing'[ ]'$ ; Hemoptysis'[ ]'$ ; Sputum '[ ]'$ ; Snoring '[ ]'$   GI: Vomiting'[ ]'$ ; Dysphagia'[ ]'$ ; Melena'[ ]'$ ; Hematochezia '[ ]'$ ; Heartburn'[ ]'$ ; Abdominal pain '[ ]'$ ; Constipation '[ ]'$ ; Diarrhea '[ ]'$ ; BRBPR '[ ]'$   GU: Hematuria'[ ]'$ ; Dysuria '[ ]'$ ; Nocturia'[ ]'$   Vascular: Pain in legs with walking '[ ]'$ ; Pain in feet with lying flat '[ ]'$ ; Non-healing sores '[ ]'$ ; Stroke '[ ]'$ ; TIA '[ ]'$ ; Slurred speech '[ ]'$ ;  Neuro: Headaches'[ ]'$ ; Vertigo'[ ]'$ ; Seizures'[ ]'$ ; Paresthesias'[ ]'$ ;Blurred  vision '[ ]'$ ; Diplopia '[ ]'$ ; Vision changes '[ ]'$   Ortho/Skin: Arthritis '[ ]'$ ; Joint pain [Y ]; Muscle pain '[ ]'$ ; Joint swelling [ Y]; Back Pain '[ ]'$ ; Rash '[ ]'$   Psych: Depression'[ ]'$ ; Anxiety'[ ]'$   Heme: Bleeding problems '[ ]'$ ; Clotting disorders '[ ]'$ ; Anemia '[ ]'$   Endocrine: Diabetes '[ ]'$ ; Thyroid dysfunction'[ ]'$   Home Medications Prior to Admission medications   Medication Sig Start Date End Date Taking? Authorizing Provider  amiodarone (PACERONE) 200 MG tablet TAKE 1 TABLET BY MOUTH EVERY DAY 12/19/21   [name], MD  azaTHIOprine (IMURAN) 50 MG tablet TAKE 2 TABLETS BY MOUTH EVERY DAY 04/20/22   Rice, Resa Miner, MD  bumetanide (BUMEX) 2 MG tablet Take 1 tablet (2 mg total) by mouth daily. 02/06/22   [name], MD  Cholecalciferol (VITAMIN D3) 25 MCG (1000 UT) CAPS Take 1 capsule by mouth daily in the afternoon.    [provider]  cholestyramine (QUESTRAN) 4 g packet Take 1 packet (4 g total) by mouth daily. 08/08/20   [name], MD  cyclobenzaprine (FLEXERIL) 10 MG tablet Take 1 tablet (10 mg total) by mouth 3 (three) times daily as needed for muscle spasms. 12/29/21   [name], [name]  ELIQUIS 2.5 MG TABS tablet TAKE 1 TABLET BY MOUTH TWICE A DAY 12/31/21   [name], MD  Ensure (ENSURE) Take 237 mLs by mouth 2 (two) times daily between meals.    [provider]  folic acid (FOLVITE) 1 MG tablet TAKE 1 TABLET BY MOUTH EVERY DAY 07/30/21   [name], MD  hydroxychloroquine (PLAQUENIL) 200 MG tablet TAKE 1 TABLET BY MOUTH EVERY DAY 05/29/22   [name], MD  hydroxypropyl methylcellulose / hypromellose (ISOPTO TEARS / GONIOVISC) 2.5 % ophthalmic solution Place 1 drop into both eyes as needed for dry eyes.    [provider]  lidocaine (LIDODERM) 5 % Place 1 patch onto the skin daily. Remove & Discard patch within 12 hours or as directed by MD 03/09/22   [name], [name]  Menthol, Topical Analgesic, (ICY HOT BACK EX) Apply 1 application  topically daily as needed (pain).    [provider]  midodrine (PROAMATINE) 5 MG tablet Take 1 tablet (5 mg total) by mouth 3 (three) times daily with meals. 02/04/22   [name], MD  MITIGARE 0.6 MG CAPS Take 0.6 mg by mouth daily as needed (gout). 07/18/20   [provider]  Multiple Vitamin (MULTIVITAMIN) tablet Take 1 tablet by mouth daily.    [provider]  omeprazole (PRILOSEC) 40 MG capsule Take 40 mg by mouth daily.    [provider]  polyethylene glycol (MIRALAX / GLYCOLAX) 17 g packet Take 17 g by mouth as needed.    [provider]  predniSONE (DELTASONE)  5 MG tablet TAKE 1 TABLET BY MOUTH EVERY DAY WITH BREAKFAST 05/04/22   Rice, Resa Miner, MD  Vericiguat (VERQUVO) 10 MG TABS Take 10 mg by mouth daily. 09/03/21   [name], MD    Past Medical History: Past Medical History:  Diagnosis Date   Acid reflux 07/15/2020   Arthritis 07/15/2020   CHF (congestive heart failure) ([place])    Colitis 07/15/2020   Dilated cardiomyopathy ([place]) 07/15/2020   HFrEF (heart failure with reduced ejection fraction) ([place]) 07/15/2020   HTN (hypertension) 07/15/2020   Osteoporosis 07/15/2020   Palpitations 07/15/2020   Rheumatoid arthritis ([place]) 07/15/2020    Past Surgical History: Past Surgical History:  Procedure Laterality Date   CARDIOVERSION N/A 11/08/2020   Procedure: CARDIOVERSION;  Surgeon: [name], MD;  Location: [facility];  Service: Cardiovascular;  Laterality: N/A;   RIGHT HEART CATH N/A 08/14/2020   Procedure: RIGHT HEART CATH;  Surgeon: [name], MD;  Location: [place] [facility] CV LAB;  Service: Cardiovascular;  Laterality: N/A;   TEE WITHOUT CARDIOVERSION N/A 11/08/2020   Procedure: TRANSESOPHAGEAL ECHOCARDIOGRAM (TEE);  Surgeon: [name], MD;  Location: [facility] ENDOSCOPY;  Service: Cardiovascular;  Laterality: N/A;    Family History: Family History  Problem Relation Age of Onset   Hypertension Mother    Diabetes  Mother    Hypertension Father    Diabetes Father    Breast cancer Maternal Aunt    Breast cancer Paternal Aunt    Arthritis Maternal Grandmother    Lung disease Paternal Grandfather    Cancer Brother     Social History: Social History   Socioeconomic History   Marital status: Widowed    Spouse name: Not on file   Number of children: Not on file   Years of education: 16   Highest education level: Bachelor's degree (e.g., BA, AB, BS)  Occupational History   Occupation: Retired  Tobacco Use   Smoking status: Never   Smokeless tobacco: Never  Vaping Use   Vaping Use: Never used  Substance and Sexual Activity   Alcohol use: Never   Drug use: Never   Sexual activity: Not Currently  Other Topics Concern   Not on file  Social History Narrative   Not on file   Social Determinants of Health   Financial Resource Strain: Low Risk  (04/30/2022)   Overall Financial Resource Strain (CARDIA)    Difficulty of Paying Living Expenses: Not hard at all  Food Insecurity: No Food Insecurity (04/30/2022)   Hunger Vital Sign    Worried About Running Out of Food in the Last Year: Never true    Ran Out of Food in the Last Year: Never true  Transportation Needs: Unmet Transportation Needs (06/26/2022)   PRAPARE - Transportation    Lack of Transportation (Medical): Yes    Lack of Transportation (Non-Medical): Yes  Physical Activity: Inactive (04/30/2022)   Exercise Vital Sign    Days of Exercise per Week: 0 days    Minutes of Exercise per Session: 0 min  Stress: No Stress Concern Present (04/30/2022)   [employer] [place]    Feeling of Stress : Not at all  Social Connections: Not on file    Allergies:  Allergies  Allergen Reactions   Baclofen Nausea And Vomiting   Penicillin G     Other reaction(s): Unknown   Penicillins Rash    Objective:    Vital Signs:   Temp:  [98 F (36.7  C)-98.7 F (37.1 C)] 98 F (36.7 C) (11/30  0745) Pulse Rate:  [79-97] 91 (11/30 0745) Resp:  [18-19] 18 (11/30 0351) BP: (110-132)/(77-83) 110/78 (11/30 0745) SpO2:  [98 %-100 %] 100 % (11/30 0745) Weight:  [42.5 kg] 42.5 kg (11/29 1647) Last BM Date : 07/08/22  Weight change: Filed Weights   07/08/22 1647  Weight: 42.5 kg    Intake/Output:  No intake or output data in the 24 hours ending 07/09/22 1108    Physical Exam    General: frail elderly female, no respiratory difficulty  HEENT: normal Neck: supple. JVP 7 cm. Carotids 2+ bilat; no bruits. No lymphadenopathy or thyromegaly appreciated. Cor: PMI nondisplaced. Regular rate & rhythm. No rubs, gallops or murmurs. Lungs: clear Abdomen: soft, nontender, nondistended. No hepatosplenomegaly. No bruits or masses. Good bowel sounds. Extremities: no cyanosis, clubbing, rash, edema Neuro: alert & orientedx3, cranial nerves grossly intact. moves all 4 extremities w/o difficulty. Affect pleasant   Telemetry   NSR 70s   EKG    Low voltage QRS 78 bpm, NSR. No ST abnormalties   Labs   Basic Metabolic Panel: Recent Labs  Lab 07/07/22 1515 07/08/22 2015 07/09/22 0632  NA 139 141 140  K 4.6 4.5 4.7  CL 109 108 112*  CO2 18* 20* 18*  GLUCOSE 104* 119* 129*  BUN 65* 69* 65*  CREATININE 3.35* 3.33* 3.36*  CALCIUM 9.5 9.6 9.0  MG  --   --  2.1  PHOS  --   --  5.1*    Liver Function Tests: Recent Labs  Lab 07/07/22 1515 07/09/22 0632  AST 30  --   ALT 32  --   ALKPHOS 56  --   BILITOT 0.6  --   PROT 6.3*  --   ALBUMIN 3.2* 3.1*   No results for input(s): "LIPASE", "AMYLASE" in the last 168 hours. No results for input(s): "AMMONIA" in the last 168 hours.  CBC: Recent Labs  Lab 07/07/22 1515 07/08/22 0807 07/08/22 2015 07/09/22 0632  WBC 11.3*  --  10.4 11.0*  NEUTROABS  --   --  9.1*  --   HGB 10.5* 10.7* 10.9* 9.8*  HCT 33.6*  --  36.0 31.2*  MCV 94.4  --  95.7 94.0  PLT 277  --  246 228    Cardiac Enzymes: No results for input(s):  "CKTOTAL", "CKMB", "CKMBINDEX", "TROPONINI" in the last 168 hours.  BNP: BNP (last 3 results) Recent Labs    11/06/21 1241 03/04/22 1629 07/07/22 1515  BNP 190.3* 193.6* 232.0*    ProBNP (last 3 results) No results for input(s): "PROBNP" in the last 8760 hours.   CBG: Recent Labs  Lab 07/08/22 2357  GLUCAP 173*    Coagulation Studies: No results for input(s): "LABPROT", "INR" in the last 72 hours.   Imaging   CT Hip Left Wo Contrast  Result Date: 07/08/2022 CLINICAL DATA:  Left hip fracture EXAM: CT OF THE LEFT HIP WITHOUT CONTRAST TECHNIQUE: Multidetector CT imaging of the left hip was performed according to the standard protocol. Multiplanar CT image reconstructions were also generated. RADIATION DOSE REDUCTION: This exam was performed according to the departmental dose-optimization program which includes automated exposure control, adjustment of the mA and/or kV according to patient size and/or use of iterative reconstruction technique. COMPARISON:  Same-day x-ray FINDINGS: Bones/Joint/Cartilage Acute comminuted intertrochanteric fracture of the proximal left femur. Mild superior migration of the distal fracture component with near 90 degrees of varus angulation. Greater trochanteric fragment is  mildly displaced posteriorly. Nondisplaced lesser trochanteric component. Left hip joint alignment is maintained without dislocation. Severe osteoarthritis of the left hip. Visualized portion of the left hemipelvis is intact without fracture or diastasis. No lytic or sclerotic bone lesion is identified. Ligaments Suboptimally assessed by CT. Muscles and Tendons No acute musculotendinous abnormality by CT. Soft tissues Mild soft tissue swelling at the lateral aspect of the hip. No organized fluid collection or hematoma. Atherosclerotic vascular calcifications. Scattered colonic diverticulosis. Multiple calcified uterine fibroids. IMPRESSION: 1. Acute comminuted intertrochanteric fracture of  the proximal left femur with varus angulation. 2. Severe osteoarthritis of the left hip. Electronically Signed   By: Davina Poke D.O.   On: 07/08/2022 19:34   CT Cervical Spine Wo Contrast  Result Date: 07/08/2022 CLINICAL DATA:  Fall.  Neck trauma EXAM: CT CERVICAL SPINE WITHOUT CONTRAST TECHNIQUE: Multidetector CT imaging of the cervical spine was performed without intravenous contrast. Multiplanar CT image reconstructions were also generated. RADIATION DOSE REDUCTION: This exam was performed according to the departmental dose-optimization program which includes automated exposure control, adjustment of the mA and/or kV according to patient size and/or use of iterative reconstruction technique. COMPARISON:  None Available. FINDINGS: Alignment: Facet joints are aligned without dislocation or traumatic listhesis. Dens and lateral masses are aligned. Skull base and vertebrae: No acute fracture. No primary bone lesion or focal pathologic process. Soft tissues and spinal canal: No prevertebral fluid or swelling. No visible canal hematoma. Disc levels: Mild multilevel disc height loss with uncovertebral spurring. Advanced multilevel bilateral facet arthropathy. Upper chest: Negative. Other: Bilateral carotid atherosclerosis. IMPRESSION: 1. No acute fracture or traumatic listhesis of the cervical spine. 2. Facet-predominant multilevel cervical spondylosis. Electronically Signed   By: Davina Poke D.O.   On: 07/08/2022 19:31   CT HEAD WO CONTRAST (5MM)  Result Date: 07/08/2022 CLINICAL DATA:  Head trauma, minor (Age >= 65y) EXAM: CT HEAD WITHOUT CONTRAST TECHNIQUE: Contiguous axial images were obtained from the base of the skull through the vertex without intravenous contrast. RADIATION DOSE REDUCTION: This exam was performed according to the departmental dose-optimization program which includes automated exposure control, adjustment of the mA and/or kV according to patient size and/or use of iterative  reconstruction technique. COMPARISON:  11/01/2021 FINDINGS: Brain: There is periventricular white matter decreased attenuation consistent with small vessel ischemic changes. Ventricles, sulci and cisterns are prominent consistent with age related involutional changes. No acute intracranial hemorrhage, mass effect or shift. No hydrocephalus. Vascular: No hyperdense vessel or unexpected calcification. Skull: Normal. Negative for fracture or focal lesion. Sinuses/Orbits: No acute finding. IMPRESSION: Atrophy and chronic small vessel ischemic changes. No acute intracranial process identified. Electronically Signed   By: Sammie Bench M.D.   On: 07/08/2022 19:28   DG Hip Unilat W or Wo Pelvis 2-3 Views Left  Result Date: 07/08/2022 CLINICAL DATA:  Left hip pain after fall EXAM: DG HIP (WITH OR WITHOUT PELVIS) 2-3V LEFT COMPARISON:  None Available. FINDINGS: Th acute intertrochanteric fracture of the left femur. There is slight lateral and superior displacement of the distal fragment. Probable additional fracture of the lesser trochanter. The left femoral head remains located within the acetabulum. Uterine fibroids. IMPRESSION: Left intertrochanteric femur fracture. Electronically Signed   By: Placido Sou M.D.   On: 07/08/2022 18:56   DG Chest 1 View  Result Date: 07/08/2022 CLINICAL DATA:  [place] onto left side, left hip fracture, preoperative evaluation EXAM: CHEST  1 VIEW COMPARISON:  11/01/2021 FINDINGS: Single frontal view of the chest demonstrates a stable cardiac  silhouette. Stable ectasia of the thoracic aorta. No airspace disease, effusion, or pneumothorax. There are no acute bony abnormalities. IMPRESSION: 1. No acute intrathoracic process. Electronically Signed   By: Randa Ngo M.D.   On: 07/08/2022 18:55     Medications:     Current Medications:  midodrine  5 mg Oral TID WC   multivitamin with minerals  1 tablet Oral Daily   pantoprazole  40 mg Oral Daily   senna-docusate  1 tablet  Oral QHS    Infusions:     Patient Profile   76 y/o female w/ 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 Ortho has seen, planning possible IMN tomorrow. AHF team asked to see for CV risk stratification/ surgical clearance.  Assessment/Plan   1. CV Risk Assessment - Chronic systolic heart failure, EF severely reduced < 20% on last echo w/ mod MR and mod TR. Frail w/ chronic NYHA Class IIIB symptoms, felt to be nearing end-stage. Surgery not prohibitive but she is very high risk given cardiac issues  2. Lt Hip Fx: 2/2 mechanical fall - ortho following, consider IMN  3. Chronic systolic CHF: Nonischemic cardiomyopathy.  This has been known since 2015, cath in 2015 showed no coronary disease and cardiac MRI in 2015 showed no LGE.  Cause is uncertain, familial cardiomyopathy is a concern given nonischemic cardiomyopathy in her mother.  Cannot rule out remote viral myocarditis.Poor appetite and severe HF limitation on CPX in 3/22 is concerning, as is elevated creatinine (suspect cardiorenal syndrome).  Low BP and elevated creatinine have limited her cardiac med regimen. RHC (1/22) showed low filling pressures and actually a relatively preserved cardiac index of 2.22.  PYP scan in 4/22 was equivocal, PYP scan repeated in 3/23 and likely negative.  Cardiac MRI in 10/22 showed mild LV dilation with EF 23%, normal RV size with RVEF 37%, ECV 34%, small area of subendocardial LGE in the mid inferolateral wall and small area of full thickness LGE in the basal inferior wall.   This was not suggestive of cardiac amyloidosis but possibly could be consistent with coronary embolism (though no LV thrombus visualized) versus prior myocarditis versus sarcoidosis. She does not appear volume overloaded on exam.  NYHA class IIIb chronically. She is quite frail. GDMT limited by  renal dysfunction and hypotension.  - Continue midodrine 5 mg tid.  - Hold bumetanide today.  - Continue Verquvo 10 mg daily.  - Off dapagliflozin with rise in creatinine.   - Off spironolactone with elevated creatinine and soft BP.   - Though RHC in 1/22 was not markedly abnormal, CPX in 3/22 showed severe functional limitation due to HF and BP also runs quite low.  I am worried that she is nearing end-stage.  She is frail with CKD stage 4, I do not think that she would be a candidate for LVAD.  - With renal failure, CHF, and atrial arrhythmias, we have assessed for cardiac amyloidosis.  PYP scan was equivocal in 4/22 for transthyretin cardiac amyloidosis.  It was repeated in 3/23 and likely negative.  [employer] gene testing for TTR mutations was negative. The cardiac MRI was not consistent with amyloidosis.  She has MGUS with monoclonal IgG paraprotein, but based on cMRI and slow progression, think AL amyloidosis is unlikely.   - She is interested in barostimulation activation therapy, will have nurse discuss with her.   -  Does not have an ICD, would not place given NICM, advanced age, and concern for nearing end stage HF.  4. CKD: Stage 4.  She is followed by Dr. Royce Macadamia. - SCr 3.3, stable this admission  - follow BMP  5. Rheumatoid arthritis: No history of lung involvement. She has been on a low dose of prednisone chronically.  6. Atrial fibrillation/flutter: S/p TEE-DCCV in 4/22.  She is now on amiodarone and in NSR.  - Continue amiodarone 200 mg daily.  - Holding Eliquis for possible surgery. On  2.5 mg bid PTA (elevated creatinine, low weight).  7. 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    Length of Stay: 1  Brittainy Simmons, PA-C  07/09/2022, 11:08 AM  Advanced Heart Failure Team Pager [phone] (M-F; 7a - 5p)  Please contact [facility] for night-coverage after hours (4p -7a ) and weekends on [email or web address]  Patient seen with NP, agree with the above note.   Patient suffered a  mechanical fall (tripped, no syncope) with left hip fracture.  Tentative plan for surgical repair on Friday.   General: NAD, frail Neck: No JVD, no thyromegaly or thyroid nodule.  Lungs: Clear to auscultation bilaterally with normal respiratory effort. CV: Nondisplaced PMI.  Heart regular S1/S2, no S3/S4, no murmur.  No peripheral edema.   Abdomen: Soft, nontender, no hepatosplenomegaly, no distention.  Skin: Intact without lesions or rashes.  Neurologic: Alert and oriented x 3.  Psych: Normal affect. Extremities: No clubbing or cyanosis.  HEENT: Normal.   Patient has a nonischemic cardiomyopathy with failure to thrive, NYHA class IIIb symptoms at baseline.  She is quite frail.  Surgery will be high risk for her, but the alternative is likely to be immobile/unable to walk. Her symptoms have been significant but stable recently and she is not volume overloaded.   I think the best course is going to be proceeding with hip repair, with the understanding that her risk is going to be higher based on her comorbidities. Creatinine has also been higher recently, 3.36 today.  - Hold bumetanide for now with stable volume and elevated creatinine.  - Continue vericiguat.  - Hold apixaban pre-op, will need to restart after surgery.  - Continue amiodarone.  - Will get echo, unlikely to affect surgical plan but is due for functional assessment (> 1 year since last functional assessment).   Loralie Champagne 07/09/2022 2:31 PM

## 2022-07-09 NOTE — Progress Notes (Signed)
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  She was found to have a left intertrochanteric fracture.  Orthopedic Dr. Erlinda Hong was consulted plan is for surgery on Friday after Eliquis washout.    Assessment & Plan:   Principal Problem:   Closed left hip fracture, initial encounter ([place]) Active Problems:   Non-ischemic cardiomyopathy (HCC)   HFrEF (heart failure with reduced ejection fraction) (HCC)   HTN (hypertension)   Atrial flutter (HCC)   CKD (chronic kidney disease), stage IV ([place])  1-Closed Left Hip Fracture:  Orthopedic consulted, cardiology consulted for pre op evaluation. Patient is high risk for procedure.  Surgery on Friday depending on discussion with patient and family.   2-CKD stage IV, acute on chronic.  Metabolic acidosis.  Cr baseline 2.4  Cr Increase to 3.3.  Monitor.  Encourage oral intake.   3-Heart failure with reduced ejection fraction, ejection fraction less than 20%, nonischemic cardiomyopathy Follow-up with Dr. Trey Paula Cardiology consulted for preop assessment, patient felt to be high risk for procedure Hold Bumetadine.  Continue with Verquvo.   4-Paroxysmal A-fib: on Eliquis: Holding Eliquis in anticipation of surgery  5-Rheumatoid arthritis: Present immunosuppressant medication.  6-GERD: Continue with PPI  7-Hypertension: Continue with Midodrine.   8-Dysphagia; Report difficulty swallowing solid and liquids for months. Plan to start IV PPI BID. Esophagogram after Sx, per patient request.    Estimated body mass index is 17.7 kg/m as calculated from the following:   Height as of  this encounter: '5\' 1"'$  (1.549 m).   Weight as of this encounter: 42.5 kg.   DVT prophylaxis: SCD Code Status: Full code Family Communication: daughter over phone.  Disposition Plan:  Status is: Inpatient Remains inpatient appropriate because: management of hip fracture.     Consultants:  Cardiology Dr Erlinda Hong with Ortho  Procedures:    Antimicrobials:    Subjective: She denies chest pain. She had some chest pain transiently yesterday after she fell.    Objective: Vitals:   07/08/22 2354 07/09/22 0351 07/09/22 0745 07/09/22 1503  BP: 132/77 114/77 110/78 105/67  Pulse: 95 97 91 88  Resp: 19 18    Temp: 98.2 F (36.8 C) 98.2 F (36.8 C) 98 F (36.7 C) 97.6 F (36.4 C)  TempSrc: Oral Oral Oral Oral  SpO2: 100% 98% 100% 98%  Weight:      Height:       No intake or output data in the 24 hours ending 07/09/22 1616 Filed Weights   07/08/22 1647  Weight: 42.5 kg    Examination:  General exam: Appears calm and comfortable  Respiratory system: Clear to auscultation. Respiratory effort normal. Cardiovascular system: S1 & S2 heard, IRR. No JVD, murmurs, rubs, gallops or clicks. No pedal edema. Gastrointestinal system: Abdomen is nondistended, soft and nontender. No organomegaly or masses felt. Normal bowel sounds heard. Central nervous system: Alert and oriented. No focal neurological deficits. Extremities: left LE shorter.     Data Reviewed: I have personally reviewed following labs and imaging studies  CBC: Recent Labs  Lab 07/07/22 1515 07/08/22 0807 07/08/22 2015 07/09/22 0632  WBC 11.3*  --  10.4 11.0*  NEUTROABS  --   --  9.1*  --   HGB 10.5* 10.7* 10.9* 9.8*  HCT 33.6*  --  36.0 31.2*  MCV 94.4  --  95.7 94.0  PLT 277  --  246 222   Basic Metabolic Panel: Recent Labs  Lab 07/07/22 1515 07/08/22 2015 07/09/22 0632  NA 139 141 140  K 4.6 4.5 4.7  CL 109 108 112*  CO2 18* 20* 18*  GLUCOSE 104* 119* 129*  BUN 65* 69* 65*  CREATININE 3.35* 3.33*  3.36*  CALCIUM 9.5 9.6 9.0  MG  --   --  2.1  PHOS  --   --  5.1*   GFR: Estimated Creatinine Clearance: 9.6 mL/min (A) (by C-G formula based on SCr of 3.36 mg/dL (H)). Liver Function Tests: Recent Labs  Lab 07/07/22 1515 07/09/22 0632  AST 30  --   ALT 32  --   ALKPHOS 56  --   BILITOT 0.6  --   PROT 6.3*  --   ALBUMIN 3.2* 3.1*   No results for input(s): "LIPASE", "AMYLASE" in the last 168 hours. No results for input(s): "AMMONIA" in the last 168 hours. Coagulation Profile: No results for input(s): "INR", "PROTIME" in the last 168 hours. Cardiac Enzymes: No results for input(s): "CKTOTAL", "CKMB", "CKMBINDEX", "TROPONINI" in the last 168 hours. BNP (last 3 results) No results for input(s): "PROBNP" in the last 8760 hours. HbA1C: No results for input(s): "HGBA1C" in the last 72 hours. CBG: Recent Labs  Lab 07/08/22 2357  GLUCAP 173*   Lipid Profile: No results for input(s): "CHOL", "HDL", "LDLCALC", "TRIG", "CHOLHDL", "LDLDIRECT" in the last 72 hours. Thyroid Function Tests: Recent Labs    07/07/22 1515  TSH 1.167   Anemia Panel: No results for input(s): "VITAMINB12", "FOLATE", "FERRITIN", "TIBC", "IRON", "RETICCTPCT" in the last 72 hours. Sepsis Labs: No results for input(s): "PROCALCITON", "LATICACIDVEN" in the last 168 hours.  No results found for this or any previous visit (from the past 240 hour(s)).       Radiology Studies: CT Hip Left Wo Contrast  Result Date: 07/08/2022 CLINICAL DATA:  Left hip fracture EXAM: CT OF THE LEFT HIP WITHOUT CONTRAST TECHNIQUE: Multidetector CT imaging of the left hip was performed according to the standard protocol. Multiplanar CT image reconstructions were also generated. RADIATION DOSE REDUCTION: This exam was performed according to the departmental dose-optimization program which includes automated exposure control, adjustment of the mA and/or kV according to patient size and/or use of iterative reconstruction  technique. COMPARISON:  Same-day x-ray FINDINGS: Bones/Joint/Cartilage Acute comminuted intertrochanteric fracture of the proximal left femur. Mild superior migration of the distal fracture component with near 90 degrees of varus angulation. Greater trochanteric fragment is mildly displaced posteriorly. Nondisplaced lesser trochanteric component. Left hip joint alignment is maintained without dislocation. Severe osteoarthritis of the left hip. Visualized portion of the left hemipelvis is intact without fracture or diastasis. No lytic or sclerotic bone lesion is identified. Ligaments Suboptimally assessed by CT. Muscles and Tendons No acute musculotendinous abnormality by CT. Soft tissues Mild soft tissue swelling at the lateral aspect of the hip. No organized fluid collection or hematoma. Atherosclerotic vascular calcifications. Scattered colonic diverticulosis. Multiple calcified uterine fibroids. IMPRESSION: 1. Acute comminuted intertrochanteric fracture of the proximal left femur with varus angulation. 2. Severe osteoarthritis of the left hip. Electronically Signed   By: Davina Poke D.O.   On: 07/08/2022 19:34   CT Cervical Spine Wo Contrast  Result Date: 07/08/2022 CLINICAL DATA:  Fall.  Neck trauma EXAM: CT CERVICAL SPINE  WITHOUT CONTRAST TECHNIQUE: Multidetector CT imaging of the cervical spine was performed without intravenous contrast. Multiplanar CT image reconstructions were also generated. RADIATION DOSE REDUCTION: This exam was performed according to the departmental dose-optimization program which includes automated exposure control, adjustment of the mA and/or kV according to patient size and/or use of iterative reconstruction technique. COMPARISON:  None Available. FINDINGS: Alignment: Facet joints are aligned without dislocation or traumatic listhesis. Dens and lateral masses are aligned. Skull base and vertebrae: No acute fracture. No primary bone lesion or focal pathologic process. Soft  tissues and spinal canal: No prevertebral fluid or swelling. No visible canal hematoma. Disc levels: Mild multilevel disc height loss with uncovertebral spurring. Advanced multilevel bilateral facet arthropathy. Upper chest: Negative. Other: Bilateral carotid atherosclerosis. IMPRESSION: 1. No acute fracture or traumatic listhesis of the cervical spine. 2. Facet-predominant multilevel cervical spondylosis. Electronically Signed   By: Davina Poke D.O.   On: 07/08/2022 19:31   CT HEAD WO CONTRAST (5MM)  Result Date: 07/08/2022 CLINICAL DATA:  Head trauma, minor (Age >= 65y) EXAM: CT HEAD WITHOUT CONTRAST TECHNIQUE: Contiguous axial images were obtained from the base of the skull through the vertex without intravenous contrast. RADIATION DOSE REDUCTION: This exam was performed according to the departmental dose-optimization program which includes automated exposure control, adjustment of the mA and/or kV according to patient size and/or use of iterative reconstruction technique. COMPARISON:  11/01/2021 FINDINGS: Brain: There is periventricular white matter decreased attenuation consistent with small vessel ischemic changes. Ventricles, sulci and cisterns are prominent consistent with age related involutional changes. No acute intracranial hemorrhage, mass effect or shift. No hydrocephalus. Vascular: No hyperdense vessel or unexpected calcification. Skull: Normal. Negative for fracture or focal lesion. Sinuses/Orbits: No acute finding. IMPRESSION: Atrophy and chronic small vessel ischemic changes. No acute intracranial process identified. Electronically Signed   By: Sammie Bench M.D.   On: 07/08/2022 19:28   DG Hip Unilat W or Wo Pelvis 2-3 Views Left  Result Date: 07/08/2022 CLINICAL DATA:  Left hip pain after fall EXAM: DG HIP (WITH OR WITHOUT PELVIS) 2-3V LEFT COMPARISON:  None Available. FINDINGS: Th acute intertrochanteric fracture of the left femur. There is slight lateral and superior  displacement of the distal fragment. Probable additional fracture of the lesser trochanter. The left femoral head remains located within the acetabulum. Uterine fibroids. IMPRESSION: Left intertrochanteric femur fracture. Electronically Signed   By: Placido Sou M.D.   On: 07/08/2022 18:56   DG Chest 1 View  Result Date: 07/08/2022 CLINICAL DATA:  [place] onto left side, left hip fracture, preoperative evaluation EXAM: CHEST  1 VIEW COMPARISON:  11/01/2021 FINDINGS: Single frontal view of the chest demonstrates a stable cardiac silhouette. Stable ectasia of the thoracic aorta. No airspace disease, effusion, or pneumothorax. There are no acute bony abnormalities. IMPRESSION: 1. No acute intrathoracic process. Electronically Signed   By: Randa Ngo M.D.   On: 07/08/2022 18:55        Scheduled Meds:  amiodarone  200 mg Oral Daily   azaTHIOprine  100 mg Oral Daily   cholestyramine  4 g Oral Daily   hydroxychloroquine  200 mg Oral Daily   midodrine  5 mg Oral TID WC   multivitamin with minerals  1 tablet Oral Daily   pantoprazole (PROTONIX) IV  40 mg Intravenous Q12H   predniSONE  5 mg Oral Q breakfast   senna-docusate  1 tablet Oral QHS   Vericiguat  10 mg Oral Daily   Continuous Infusions:   LOS: 1  day    Time spent: 35 minutes    Elmarie Shiley, MD [facility]   If 7PM-7AM, please contact night-coverage [email or web address]  07/09/2022, 4:16 PM

## 2022-07-09 NOTE — Progress Notes (Signed)
   07/09/22 1415  Clinical Encounter Type  Visited With Patient  Visit Type Initial;Other (Comment) (Advanced Directive)  Referral From Nurse  Consult/Referral To Chaplain   Chaplain responded to a spiritual consult for advanced directive education. The patient was very tired and struggled to talk about the paperwork. I left the paperwork on her tray table and if a chaplain is requested to return someone will respond.  I spoke with her nurse and passed the same information on to her as well.   Danice Goltz  [facility]  [phone]

## 2022-07-09 NOTE — Progress Notes (Signed)
Family will bring home medication Vericiguant Jul 10, 2022 to be administered by [facility]

## 2022-07-09 NOTE — Telephone Encounter (Signed)
Called patient about lab results. Patient is currently admitted.

## 2022-07-09 NOTE — Consult Note (Signed)
Reason for Consult:Left hip fx Referring Physician: Niel Hummer Time called: 2376 Time at bedside: [name] is an 75 y.o. female.  HPI: Tracy Nelson tripped on something at home and fell onto her hardwood floor. She had immediate left hip pain and could not get up. She was brought to the ED where x-rays showed a left hip fx and orthopedic surgery was consulted. She lives with her daughter and son-in-law.  Past Medical History:  Diagnosis Date   Acid reflux 07/15/2020   Arthritis 07/15/2020   CHF (congestive heart failure) ([place])    Colitis 07/15/2020   Dilated cardiomyopathy ([place]) 07/15/2020   HFrEF (heart failure with reduced ejection fraction) ([place]) 07/15/2020   HTN (hypertension) 07/15/2020   Osteoporosis 07/15/2020   Palpitations 07/15/2020   Rheumatoid arthritis ([place]) 07/15/2020    Past Surgical History:  Procedure Laterality Date   CARDIOVERSION N/A 11/08/2020   Procedure: CARDIOVERSION;  Surgeon: [name], MD;  Location: [facility];  Service: Cardiovascular;  Laterality: N/A;   RIGHT HEART CATH N/A 08/14/2020   Procedure: RIGHT HEART CATH;  Surgeon: [name], MD;  Location: [facility];  Service: Cardiovascular;  Laterality: N/A;   TEE WITHOUT CARDIOVERSION N/A 11/08/2020   Procedure: TRANSESOPHAGEAL ECHOCARDIOGRAM (TEE);  Surgeon: [name], MD;  Location: [facility] END[facility];  Service: Cardiovascular;  Laterality: N/A;    Family History  Problem Relation Age of Onset   Hypertension Mother    Diabetes Mother    Hypertension Father    Diabetes Father    Breast cancer Maternal Aunt    Breast cancer Paternal Aunt    Arthritis Maternal Grandmother    Lung disease Paternal Grandfather    Cancer Brother     Social History:  reports that she has never smoked. She has never used smokeless tobacco. She reports that she does not drink alcohol and does not use drugs.  Allergies:  Allergies  Allergen Reactions   Baclofen Nausea And Vomiting    Penicillin G     Other reaction(s): Unknown   Penicillins Rash    Medications: I have reviewed the patient's current medications.  Results for orders placed or performed during the hospital encounter of 07/08/22 (from the past 48 hour(s))  Basic metabolic panel     Status: Abnormal   Collection Time: 07/08/22  8:15 PM  Result Value Ref Range   Sodium 141 135 - 145 mmol/L   Potassium 4.5 3.5 - 5.1 mmol/L   Chloride 108 98 - 111 mmol/L   CO2 20 (L) 22 - 32 mmol/L   Glucose, Bld 119 (H) 70 - 99 mg/dL    Comment: Glucose reference range applies only to samples taken after fasting for at least 8 hours.   BUN 69 (H) 8 - 23 mg/dL   Creatinine, Ser 3.33 (H) 0.44 - 1.00 mg/dL   Calcium 9.6 8.9 - 10.3 mg/dL   GFR, Estimated 14 (L) >60 mL/min    Comment: (NOTE) Calculated using the CKD-EPI Creatinine Equation (2021)    Anion gap 13 5 - 15    Comment: Performed at [facility] [address], [place], [place] [address]  CBC with Differential     Status: Abnormal   Collection Time: 07/08/22  8:15 PM  Result Value Ref Range   WBC 10.4 4.0 - 10.5 K/uL   RBC 3.76 (L) 3.87 - 5.11 MIL/uL   Hemoglobin 10.9 (L) 12.0 - 15.0 g/dL   HCT 36.0 36.0 -  46.0 %   MCV 95.7 80.0 - 100.0 fL   MCH 29.0 26.0 - 34.0 pg   MCHC 30.3 30.0 - 36.0 g/dL   RDW 16.7 (H) 11.5 - 15.5 %   Platelets 246 150 - 400 K/uL   nRBC 0.0 0.0 - 0.2 %   Neutrophils Relative % 88 %   Neutro Abs 9.1 (H) 1.7 - 7.7 K/uL   Lymphocytes Relative 6 %   Lymphs Abs 0.6 (L) 0.7 - 4.0 K/uL   Monocytes Relative 5 %   Monocytes Absolute 0.6 0.1 - 1.0 K/uL   Eosinophils Relative 0 %   Eosinophils Absolute 0.0 0.0 - 0.5 K/uL   Basophils Relative 0 %   Basophils Absolute 0.0 0.0 - 0.1 K/uL   Immature Granulocytes 1 %   Abs Immature Granulocytes 0.09 (H) 0.00 - 0.07 K/uL    Comment: Performed at [facility] [address] [place], [place] [address]  Glucose, capillary     Status: Abnormal   Collection Time: 07/08/22 11:57  PM  Result Value Ref Range   Glucose-Capillary 173 (H) 70 - 99 mg/dL    Comment: Glucose reference range applies only to samples taken after fasting for at least 8 hours.  Renal function panel     Status: Abnormal   Collection Time: 07/09/22  6:32 AM  Result Value Ref Range   Sodium 140 135 - 145 mmol/L   Potassium 4.7 3.5 - 5.1 mmol/L   Chloride 112 (H) 98 - 111 mmol/L   CO2 18 (L) 22 - 32 mmol/L   Glucose, Bld 129 (H) 70 - 99 mg/dL    Comment: Glucose reference range applies only to samples taken after fasting for at least 8 hours.   BUN 65 (H) 8 - 23 mg/dL   Creatinine, Ser 3.36 (H) 0.44 - 1.00 mg/dL   Calcium 9.0 8.9 - 10.3 mg/dL   Phosphorus 5.1 (H) 2.5 - 4.6 mg/dL   Albumin 3.1 (L) 3.5 - 5.0 g/dL   GFR, Estimated 14 (L) >60 mL/min    Comment: (NOTE) Calculated using the CKD-EPI Creatinine Equation (2021)    Anion gap 10 5 - 15    Comment: Performed at [facility] [address] [place], [place] [address]  CBC     Status: Abnormal   Collection Time: 07/09/22  6:32 AM  Result Value Ref Range   WBC 11.0 (H) 4.0 - 10.5 K/uL   RBC 3.32 (L) 3.87 - 5.11 MIL/uL   Hemoglobin 9.8 (L) 12.0 - 15.0 g/dL   HCT 31.2 (L) 36.0 - 46.0 %   MCV 94.0 80.0 - 100.0 fL   MCH 29.5 26.0 - 34.0 pg   MCHC 31.4 30.0 - 36.0 g/dL   RDW 16.7 (H) 11.5 - 15.5 %   Platelets 228 150 - 400 K/uL   nRBC 0.0 0.0 - 0.2 %    Comment: Performed at [facility], [place] [address], [place], [place] [address]  Magnesium     Status: None   Collection Time: 07/09/22  6:32 AM  Result Value Ref Range   Magnesium 2.1 1.7 - 2.4 mg/dL    Comment: Performed at [facility] [address] [place], [place] [address]    CT Hip Left Wo Contrast  Result Date: 07/08/2022 CLINICAL DATA:  Left hip fracture EXAM: CT OF THE LEFT HIP WITHOUT CONTRAST TECHNIQUE: Multidetector CT imaging of the left hip was performed according to the standard protocol.  Multiplanar CT image reconstructions were also generated.  RADIATION DOSE REDUCTION: This exam was performed according to the departmental dose-optimization program which includes automated exposure control, adjustment of the mA and/or kV according to patient size and/or use of iterative reconstruction technique. COMPARISON:  Same-day x-ray FINDINGS: Bones/Joint/Cartilage Acute comminuted intertrochanteric fracture of the proximal left femur. Mild superior migration of the distal fracture component with near 90 degrees of varus angulation. Greater trochanteric fragment is mildly displaced posteriorly. Nondisplaced lesser trochanteric component. Left hip joint alignment is maintained without dislocation. Severe osteoarthritis of the left hip. Visualized portion of the left hemipelvis is intact without fracture or diastasis. No lytic or sclerotic bone lesion is identified. Ligaments Suboptimally assessed by CT. Muscles and Tendons No acute musculotendinous abnormality by CT. Soft tissues Mild soft tissue swelling at the lateral aspect of the hip. No organized fluid collection or hematoma. Atherosclerotic vascular calcifications. Scattered colonic diverticulosis. Multiple calcified uterine fibroids. IMPRESSION: 1. Acute comminuted intertrochanteric fracture of the proximal left femur with varus angulation. 2. Severe osteoarthritis of the left hip. Electronically Signed   By: Davina Poke D.O.   On: 07/08/2022 19:34   CT Cervical Spine Wo Contrast  Result Date: 07/08/2022 CLINICAL DATA:  Fall.  Neck trauma EXAM: CT CERVICAL SPINE WITHOUT CONTRAST TECHNIQUE: Multidetector CT imaging of the cervical spine was performed without intravenous contrast. Multiplanar CT image reconstructions were also generated. RADIATION DOSE REDUCTION: This exam was performed according to the departmental dose-optimization program which includes automated exposure control, adjustment of the mA and/or kV according to patient size and/or use of iterative reconstruction technique. COMPARISON:   None Available. FINDINGS: Alignment: Facet joints are aligned without dislocation or traumatic listhesis. Dens and lateral masses are aligned. Skull base and vertebrae: No acute fracture. No primary bone lesion or focal pathologic process. Soft tissues and spinal canal: No prevertebral fluid or swelling. No visible canal hematoma. Disc levels: Mild multilevel disc height loss with uncovertebral spurring. Advanced multilevel bilateral facet arthropathy. Upper chest: Negative. Other: Bilateral carotid atherosclerosis. IMPRESSION: 1. No acute fracture or traumatic listhesis of the cervical spine. 2. Facet-predominant multilevel cervical spondylosis. Electronically Signed   By: Davina Poke D.O.   On: 07/08/2022 19:31   CT HEAD WO CONTRAST (5MM)  Result Date: 07/08/2022 CLINICAL DATA:  Head trauma, minor (Age >= 65y) EXAM: CT HEAD WITHOUT CONTRAST TECHNIQUE: Contiguous axial images were obtained from the base of the skull through the vertex without intravenous contrast. RADIATION DOSE REDUCTION: This exam was performed according to the departmental dose-optimization program which includes automated exposure control, adjustment of the mA and/or kV according to patient size and/or use of iterative reconstruction technique. COMPARISON:  11/01/2021 FINDINGS: Brain: There is periventricular white matter decreased attenuation consistent with small vessel ischemic changes. Ventricles, sulci and cisterns are prominent consistent with age related involutional changes. No acute intracranial hemorrhage, mass effect or shift. No hydrocephalus. Vascular: No hyperdense vessel or unexpected calcification. Skull: Normal. Negative for fracture or focal lesion. Sinuses/Orbits: No acute finding. IMPRESSION: Atrophy and chronic small vessel ischemic changes. No acute intracranial process identified. Electronically Signed   By: Sammie Bench M.D.   On: 07/08/2022 19:28   DG Hip Unilat W or Wo Pelvis 2-3 Views Left  Result  Date: 07/08/2022 CLINICAL DATA:  Left hip pain after fall EXAM: DG HIP (WITH OR WITHOUT PELVIS) 2-3V LEFT COMPARISON:  None Available. FINDINGS: Th acute intertrochanteric fracture of the left femur. There is slight lateral and superior displacement of the distal fragment. Probable additional  fracture of the lesser trochanter. The left femoral head remains located within the acetabulum. Uterine fibroids. IMPRESSION: Left intertrochanteric femur fracture. Electronically Signed   By: Placido Sou M.D.   On: 07/08/2022 18:56   DG Chest 1 View  Result Date: 07/08/2022 CLINICAL DATA:  [place] onto left side, left hip fracture, preoperative evaluation EXAM: CHEST  1 VIEW COMPARISON:  11/01/2021 FINDINGS: Single frontal view of the chest demonstrates a stable cardiac silhouette. Stable ectasia of the thoracic aorta. No airspace disease, effusion, or pneumothorax. There are no acute bony abnormalities. IMPRESSION: 1. No acute intrathoracic process. Electronically Signed   By: Randa Ngo M.D.   On: 07/08/2022 18:55    Review of Systems  HENT:  Negative for ear discharge, ear pain, hearing loss and tinnitus.   Eyes:  Negative for photophobia and pain.  Respiratory:  Negative for cough and shortness of breath.   Cardiovascular:  Negative for chest pain.  Gastrointestinal:  Negative for abdominal pain, nausea and vomiting.  Genitourinary:  Negative for dysuria, flank pain, frequency and urgency.  Musculoskeletal:  Positive for arthralgias (Left hip). Negative for back pain, myalgias and neck pain.  Neurological:  Negative for dizziness and headaches.  Hematological:  Does not bruise/bleed easily.  Psychiatric/Behavioral:  The patient is not nervous/anxious.    Blood pressure 110/78, pulse 91, temperature 98 F (36.7 C), temperature source Oral, resp. rate 18, height '5\' 1"'$  (1.549 m), weight 42.5 kg, SpO2 100 %. Physical Exam Constitutional:      General: She is not in acute distress.    Appearance:  She is well-developed. She is not diaphoretic.  HENT:     Head: Normocephalic and atraumatic.  Eyes:     General: No scleral icterus.       Right eye: No discharge.        Left eye: No discharge.     Conjunctiva/sclera: Conjunctivae normal.  Cardiovascular:     Rate and Rhythm: Normal rate and regular rhythm.  Pulmonary:     Effort: Pulmonary effort is normal. No respiratory distress.  Musculoskeletal:     Cervical back: Normal range of motion.     Comments: LLE No traumatic wounds, ecchymosis, or rash  Severe TTP hip  No knee or ankle effusion  Knee stable to varus/ valgus and anterior/posterior stress  Sens DPN, SPN, TN intact  Motor EHL, ext, flex, evers 5/5  DP 1+, PT 1+, No significant edema  Skin:    General: Skin is warm and dry.  Neurological:     Mental Status: She is alert.  Psychiatric:        Mood and Affect: Mood normal.        Behavior: Behavior normal.     Assessment/Plan: Left hip fx -- Plan IMN tomorrow with Dr. Erlinda Hong as long as she receives medical and cardiac clearance. Multiple medical problems including HFrEF<20%, nonischemic cardiomyopathy, rheumatoid arthritis on immunosuppressants, GERD, CKD IV, and MGUS -- per primary service    Lisette Abu, PA-C Orthopedic Surgery [phone] 07/09/2022, 9:31 AM

## 2022-07-10 ENCOUNTER — Inpatient Hospital Stay (HOSPITAL_COMMUNITY): Payer: Medicare PPO | Admitting: Certified Registered Nurse Anesthetist

## 2022-07-10 ENCOUNTER — Other Ambulatory Visit: Payer: Self-pay

## 2022-07-10 ENCOUNTER — Inpatient Hospital Stay (HOSPITAL_COMMUNITY): Payer: Medicare PPO

## 2022-07-10 ENCOUNTER — Encounter (HOSPITAL_COMMUNITY)
Admission: EM | Disposition: A | Discharge status: Skilled Nursing Facility | Payer: Self-pay | Source: Home / Self Care | Attending: Internal Medicine

## 2022-07-10 ENCOUNTER — Encounter (HOSPITAL_COMMUNITY): Payer: Self-pay | Admitting: Internal Medicine

## 2022-07-10 DIAGNOSIS — S72142A Displaced intertrochanteric fracture of left femur, initial encounter for closed fracture: Secondary | ICD-10-CM | POA: Diagnosis not present

## 2022-07-10 DIAGNOSIS — N289 Disorder of kidney and ureter, unspecified: Secondary | ICD-10-CM

## 2022-07-10 DIAGNOSIS — I110 Hypertensive heart disease with heart failure: Secondary | ICD-10-CM

## 2022-07-10 DIAGNOSIS — I509 Heart failure, unspecified: Secondary | ICD-10-CM

## 2022-07-10 DIAGNOSIS — S72002A Fracture of unspecified part of neck of left femur, initial encounter for closed fracture: Secondary | ICD-10-CM | POA: Diagnosis not present

## 2022-07-10 DIAGNOSIS — I5022 Chronic systolic (congestive) heart failure: Secondary | ICD-10-CM | POA: Diagnosis not present

## 2022-07-10 HISTORY — PX: INTRAMEDULLARY (IM) NAIL INTERTROCHANTERIC: SHX5875

## 2022-07-10 LAB — BASIC METABOLIC PANEL
Anion gap: 9 (ref 5–15)
BUN: 70 mg/dL — ABNORMAL HIGH (ref 8–23)
CO2: 17 mmol/L — ABNORMAL LOW (ref 22–32)
Calcium: 9.4 mg/dL (ref 8.9–10.3)
Chloride: 116 mmol/L — ABNORMAL HIGH (ref 98–111)
Creatinine, Ser: 3.75 mg/dL — ABNORMAL HIGH (ref 0.44–1.00)
GFR, Estimated: 12 mL/min — ABNORMAL LOW (ref >60)
Glucose, Bld: 118 mg/dL — ABNORMAL HIGH (ref 70–99)
Potassium: 5.1 mmol/L (ref 3.5–5.1)
Sodium: 142 mmol/L (ref 135–145)

## 2022-07-10 LAB — SURGICAL PCR SCREEN
MRSA, PCR: NEGATIVE
Staphylococcus aureus: NEGATIVE

## 2022-07-10 LAB — CBC
HCT: 29.3 % — ABNORMAL LOW (ref 36.0–46.0)
Hemoglobin: 8.9 g/dL — ABNORMAL LOW (ref 12.0–15.0)
MCH: 28.9 pg (ref 26.0–34.0)
MCHC: 30.4 g/dL (ref 30.0–36.0)
MCV: 95.1 fL (ref 80.0–100.0)
Platelets: 244 10*3/uL (ref 150–400)
RBC: 3.08 MIL/uL — ABNORMAL LOW (ref 3.87–5.11)
RDW: 17 % — ABNORMAL HIGH (ref 11.5–15.5)
WBC: 10.4 10*3/uL (ref 4.0–10.5)
nRBC: 0 % (ref 0.0–0.2)

## 2022-07-10 LAB — GLUCOSE, CAPILLARY: Glucose-Capillary: 144 mg/dL — ABNORMAL HIGH (ref 70–99)

## 2022-07-10 LAB — ABO/RH: ABO/RH(D): O POS

## 2022-07-10 SURGERY — FIXATION, FRACTURE, INTERTROCHANTERIC, WITH INTRAMEDULLARY ROD
Anesthesia: General | Laterality: Left

## 2022-07-10 MED ORDER — PHENYLEPHRINE 80 MCG/ML (10ML) SYRINGE FOR IV PUSH (FOR BLOOD PRESSURE SUPPORT)
PREFILLED_SYRINGE | INTRAVENOUS | Status: AC
  Filled 2022-07-10: qty 10

## 2022-07-10 MED ORDER — FENTANYL CITRATE (PF) 250 MCG/5ML IJ SOLN
INTRAMUSCULAR | Status: AC
  Filled 2022-07-10: qty 5

## 2022-07-10 MED ORDER — ONDANSETRON HCL 4 MG PO TABS
4.0000 mg | ORAL_TABLET | Freq: Four times a day (QID) | ORAL | Status: DC | PRN
  Administered 2022-07-11 – 2022-07-15 (×2): 4 mg via ORAL
  Filled 2022-07-10 (×2): qty 1

## 2022-07-10 MED ORDER — ORAL CARE MOUTH RINSE: 15.0000 mL | Freq: Once | OROMUCOSAL | Status: AC

## 2022-07-10 MED ORDER — CEFAZOLIN SODIUM-DEXTROSE 1-4 GM/50ML-% IV SOLN
1.0000 g | INTRAVENOUS | Status: AC
  Administered 2022-07-10: 1 g via INTRAVENOUS
  Filled 2022-07-10: qty 50

## 2022-07-10 MED ORDER — ONDANSETRON HCL 4 MG/2ML IJ SOLN: 4.0000 mg | Freq: Four times a day (QID) | INTRAMUSCULAR | Status: DC | PRN

## 2022-07-10 MED ORDER — LIDOCAINE 2% (20 MG/ML) 5 ML SYRINGE
INTRAMUSCULAR | Status: DC | PRN
  Administered 2022-07-10: 60 mg via INTRAVENOUS

## 2022-07-10 MED ORDER — POLYETHYLENE GLYCOL 3350 17 G PO PACK: 17.0000 g | PACK | Freq: Every day | ORAL | Status: DC | PRN

## 2022-07-10 MED ORDER — ACETAMINOPHEN 500 MG PO TABS
1000.0000 mg | ORAL_TABLET | Freq: Four times a day (QID) | ORAL | Status: AC
  Administered 2022-07-10 – 2022-07-11 (×4): 1000 mg via ORAL
  Filled 2022-07-10 (×4): qty 2

## 2022-07-10 MED ORDER — OXYCODONE HCL 5 MG PO TABS
10.0000 mg | ORAL_TABLET | ORAL | Status: DC | PRN
  Administered 2022-07-11: 10 mg via ORAL

## 2022-07-10 MED ORDER — ROCURONIUM BROMIDE 10 MG/ML (PF) SYRINGE
PREFILLED_SYRINGE | INTRAVENOUS | Status: DC | PRN
  Administered 2022-07-10: 40 mg via INTRAVENOUS

## 2022-07-10 MED ORDER — DEXAMETHASONE SODIUM PHOSPHATE 10 MG/ML IJ SOLN
INTRAMUSCULAR | Status: DC | PRN
  Administered 2022-07-10: 4 mg via INTRAVENOUS

## 2022-07-10 MED ORDER — METHOCARBAMOL 1000 MG/10ML IJ SOLN: 500.0000 mg | Freq: Four times a day (QID) | INTRAVENOUS | Status: DC | PRN

## 2022-07-10 MED ORDER — MAGNESIUM CITRATE PO SOLN: 1.0000 | Freq: Once | ORAL | Status: DC | PRN

## 2022-07-10 MED ORDER — ONDANSETRON HCL 4 MG/2ML IJ SOLN
INTRAMUSCULAR | Status: DC | PRN
  Administered 2022-07-10: 4 mg via INTRAVENOUS

## 2022-07-10 MED ORDER — SODIUM CHLORIDE 0.9 % IV SOLN: INTRAVENOUS | Status: DC

## 2022-07-10 MED ORDER — ACETAMINOPHEN 325 MG PO TABS
325.0000 mg | ORAL_TABLET | Freq: Four times a day (QID) | ORAL | Status: DC | PRN
  Administered 2022-07-12: 650 mg via ORAL
  Administered 2022-07-12: 325 mg via ORAL
  Administered 2022-07-13 – 2022-07-15 (×3): 650 mg via ORAL
  Filled 2022-07-10 (×3): qty 2
  Filled 2022-07-10: qty 1
  Filled 2022-07-10: qty 2

## 2022-07-10 MED ORDER — LIDOCAINE 2% (20 MG/ML) 5 ML SYRINGE
INTRAMUSCULAR | Status: AC
  Filled 2022-07-10: qty 5

## 2022-07-10 MED ORDER — ETOMIDATE 2 MG/ML IV SOLN
INTRAVENOUS | Status: AC
  Filled 2022-07-10: qty 10

## 2022-07-10 MED ORDER — ETOMIDATE 2 MG/ML IV SOLN
INTRAVENOUS | Status: DC | PRN
  Administered 2022-07-10: 12 mg via INTRAVENOUS

## 2022-07-10 MED ORDER — DOCUSATE SODIUM 100 MG PO CAPS
100.0000 mg | ORAL_CAPSULE | Freq: Two times a day (BID) | ORAL | Status: DC
  Administered 2022-07-10 – 2022-07-14 (×8): 100 mg via ORAL
  Filled 2022-07-10 (×9): qty 1

## 2022-07-10 MED ORDER — CHLORHEXIDINE GLUCONATE 0.12 % MT SOLN: 15.0000 mL | Freq: Once | OROMUCOSAL | Status: AC

## 2022-07-10 MED ORDER — DEXAMETHASONE SODIUM PHOSPHATE 10 MG/ML IJ SOLN
INTRAMUSCULAR | Status: AC
  Filled 2022-07-10: qty 1

## 2022-07-10 MED ORDER — SUGAMMADEX SODIUM 200 MG/2ML IV SOLN
INTRAVENOUS | Status: DC | PRN
  Administered 2022-07-10: 130 mg via INTRAVENOUS

## 2022-07-10 MED ORDER — METHOCARBAMOL 500 MG PO TABS: 500.0000 mg | ORAL_TABLET | Freq: Four times a day (QID) | ORAL | Status: DC | PRN

## 2022-07-10 MED ORDER — OXYCODONE HCL 5 MG PO TABS
5.0000 mg | ORAL_TABLET | ORAL | Status: DC | PRN
  Administered 2022-07-11: 5 mg via ORAL
  Filled 2022-07-10 (×4): qty 1
  Filled 2022-07-10: qty 2

## 2022-07-10 MED ORDER — 0.9 % SODIUM CHLORIDE (POUR BTL) OPTIME
TOPICAL | Status: DC | PRN
  Administered 2022-07-10: 1000 mL

## 2022-07-10 MED ORDER — HYDROMORPHONE HCL 1 MG/ML IJ SOLN
0.2500 mg | INTRAMUSCULAR | Status: DC | PRN
  Administered 2022-07-10: 0.25 mg via INTRAVENOUS

## 2022-07-10 MED ORDER — ONDANSETRON HCL 4 MG/2ML IJ SOLN
INTRAMUSCULAR | Status: AC
  Filled 2022-07-10: qty 2

## 2022-07-10 MED ORDER — ALUM & MAG HYDROXIDE-SIMETH 200-200-20 MG/5ML PO SUSP: 30.0000 mL | ORAL | Status: DC | PRN

## 2022-07-10 MED ORDER — PROPOFOL 10 MG/ML IV BOLUS
INTRAVENOUS | Status: AC
  Filled 2022-07-10: qty 20

## 2022-07-10 MED ORDER — ROCURONIUM BROMIDE 10 MG/ML (PF) SYRINGE
PREFILLED_SYRINGE | INTRAVENOUS | Status: AC
  Filled 2022-07-10: qty 10

## 2022-07-10 MED ORDER — CEFAZOLIN SODIUM-DEXTROSE 2-4 GM/100ML-% IV SOLN
2.0000 g | Freq: Four times a day (QID) | INTRAVENOUS | Status: DC
  Administered 2022-07-10: 2 g via INTRAVENOUS

## 2022-07-10 MED ORDER — PHENYLEPHRINE HCL-NACL 20-0.9 MG/250ML-% IV SOLN
INTRAVENOUS | Status: DC | PRN
  Administered 2022-07-10: 40 ug/min via INTRAVENOUS

## 2022-07-10 MED ORDER — SORBITOL 70 % SOLN: 30.0000 mL | Freq: Every day | Status: DC | PRN

## 2022-07-10 MED ORDER — MIDAZOLAM HCL 2 MG/2ML IJ SOLN
INTRAMUSCULAR | Status: AC
  Filled 2022-07-10: qty 2

## 2022-07-10 MED ORDER — OXYCODONE HCL 5 MG PO TABS: 5.0000 mg | ORAL_TABLET | Freq: Three times a day (TID) | ORAL | 0 refills | Status: DC | PRN

## 2022-07-10 MED ORDER — CHLORHEXIDINE GLUCONATE 0.12 % MT SOLN
OROMUCOSAL | Status: AC
  Administered 2022-07-10: 15 mL via OROMUCOSAL
  Filled 2022-07-10: qty 15

## 2022-07-10 MED ORDER — FENTANYL CITRATE (PF) 250 MCG/5ML IJ SOLN
INTRAMUSCULAR | Status: DC | PRN
  Administered 2022-07-10: 100 ug via INTRAVENOUS
  Administered 2022-07-10 (×2): 25 ug via INTRAVENOUS

## 2022-07-10 MED ORDER — PHENOL 1.4 % MT LIQD: 1.0000 | OROMUCOSAL | Status: DC | PRN

## 2022-07-10 MED ORDER — HYDROMORPHONE HCL 1 MG/ML IJ SOLN
0.5000 mg | INTRAMUSCULAR | Status: DC | PRN
  Administered 2022-07-10: 1 mg via INTRAVENOUS
  Filled 2022-07-10: qty 1

## 2022-07-10 MED ORDER — HYDROMORPHONE HCL 1 MG/ML IJ SOLN
INTRAMUSCULAR | Status: AC
  Filled 2022-07-10: qty 1

## 2022-07-10 MED ORDER — MENTHOL 3 MG MT LOZG: 1.0000 | LOZENGE | OROMUCOSAL | Status: DC | PRN

## 2022-07-10 MED ORDER — TRANEXAMIC ACID-NACL 1000-0.7 MG/100ML-% IV SOLN
1000.0000 mg | Freq: Once | INTRAVENOUS | Status: AC
  Administered 2022-07-10: 1000 mg via INTRAVENOUS
  Filled 2022-07-10: qty 100

## 2022-07-10 SURGICAL SUPPLY — 49 items
BAG COUNTER SPONGE SURGICOUNT (BAG) ×1 IMPLANT
BIT DRILL INTERTAN LAG SCREW (BIT) IMPLANT
BNDG COHESIVE 4X5 TAN STRL (GAUZE/BANDAGES/DRESSINGS) ×1 IMPLANT
BNDG COHESIVE 6X5 TAN STRL LF (GAUZE/BANDAGES/DRESSINGS) IMPLANT
BNDG GAUZE DERMACEA FLUFF 4 (GAUZE/BANDAGES/DRESSINGS) ×1 IMPLANT
COVER PERINEAL POST (MISCELLANEOUS) ×1 IMPLANT
COVER SURGICAL LIGHT HANDLE (MISCELLANEOUS) ×1 IMPLANT
DRAPE C-ARMOR (DRAPES) ×1 IMPLANT
DRAPE STERI IOBAN 125X83 (DRAPES) ×1 IMPLANT
DRESSING MEPILEX FLEX 4X4 (GAUZE/BANDAGES/DRESSINGS) IMPLANT
DRSG MEPILEX BORDER 4X4 (GAUZE/BANDAGES/DRESSINGS) ×1 IMPLANT
DRSG MEPILEX BORDER 4X8 (GAUZE/BANDAGES/DRESSINGS) ×1 IMPLANT
DRSG MEPILEX FLEX 4X4 (GAUZE/BANDAGES/DRESSINGS) ×2
DURAPREP 26ML APPLICATOR (WOUND CARE) ×1 IMPLANT
ELECT REM PT RETURN 9FT ADLT (ELECTROSURGICAL) ×1
ELECTRODE REM PT RTRN 9FT ADLT (ELECTROSURGICAL) ×1 IMPLANT
GAUZE PAD ABD 8X10 STRL (GAUZE/BANDAGES/DRESSINGS) ×2 IMPLANT
GLOVE BIOGEL PI IND STRL 7.0 (GLOVE) ×2 IMPLANT
GLOVE BIOGEL PI IND STRL 7.5 (GLOVE) ×1 IMPLANT
GLOVE ECLIPSE 7.0 STRL STRAW (GLOVE) ×1 IMPLANT
GLOVE SKINSENSE STRL SZ7.5 (GLOVE) ×2 IMPLANT
GLOVE SURG SYN 7.5  E (GLOVE) ×2
GLOVE SURG SYN 7.5 E (GLOVE) ×2 IMPLANT
GLOVE SURG SYN 7.5 PF PI (GLOVE) ×2 IMPLANT
GLOVE SURG UNDER POLY LF SZ7 (GLOVE) ×19 IMPLANT
GLOVE SURG UNDER POLY LF SZ7.5 (GLOVE) ×4 IMPLANT
GOWN STRL REIN XL XLG (GOWN DISPOSABLE) ×1 IMPLANT
GUIDE PIN 3.2X343 (PIN) ×2
GUIDE PIN 3.2X343MM (PIN) ×2
KIT BASIN OR (CUSTOM PROCEDURE TRAY) ×1 IMPLANT
KIT TURNOVER KIT B (KITS) ×1 IMPLANT
MANIFOLD NEPTUNE II (INSTRUMENTS) ×1 IMPLANT
NAIL LEFT 10X36 (Nail) IMPLANT
NS IRRIG 1000ML POUR BTL (IV SOLUTION) ×1 IMPLANT
PACK GENERAL/GYN (CUSTOM PROCEDURE TRAY) ×1 IMPLANT
PAD ARMBOARD 7.5X6 YLW CONV (MISCELLANEOUS) ×2 IMPLANT
PAD CAST 4YDX4 CTTN HI CHSV (CAST SUPPLIES) ×2 IMPLANT
PADDING CAST COTTON 4X4 STRL (CAST SUPPLIES) ×2
PIN GUIDE 3.2X343MM (PIN) IMPLANT
SCREW LAG COMPR KIT 80/75 (Screw) IMPLANT
STAPLER SKIN PROX 35W (STAPLE) IMPLANT
STAPLER VISISTAT 35W (STAPLE) ×1 IMPLANT
SUT VIC AB 0 CT1 27 (SUTURE) ×1
SUT VIC AB 0 CT1 27XBRD ANBCTR (SUTURE) ×1 IMPLANT
SUT VIC AB 2-0 CT1 27 (SUTURE) ×1
SUT VIC AB 2-0 CT1 TAPERPNT 27 (SUTURE) ×1 IMPLANT
TOWEL GREEN STERILE (TOWEL DISPOSABLE) ×1 IMPLANT
TOWEL GREEN STERILE FF (TOWEL DISPOSABLE) ×1 IMPLANT
WATER STERILE IRR 1000ML POUR (IV SOLUTION) ×1 IMPLANT

## 2022-07-10 NOTE — Anesthesia Procedure Notes (Addendum)
Arterial Line Insertion Start/End12/08/2021 11:00 AM, 07/10/2022 11:10 AM Performed by: Valda Favia, CRNA, CRNA  Patient location: Pre-op. Preanesthetic checklist: patient identified, IV checked, site marked, risks and benefits discussed, surgical consent, monitors and equipment checked, pre-op evaluation, timeout performed and anesthesia consent Lidocaine 1% used for infiltration Right, radial was placed Catheter size: 20 G Hand hygiene performed  and maximum sterile barriers used   Attempts: 1 Procedure performed without using ultrasound guided technique. Following insertion, dressing applied and Biopatch. Post procedure assessment: normal and unchanged  Patient tolerated the procedure well with no immediate complications.

## 2022-07-10 NOTE — Op Note (Signed)
   Date of Surgery: 07/10/2022  INDICATIONS: Tracy Nelson is a 76 y.o.-year-old female who sustained a left hip fracture. The risks and benefits of the procedure discussed with the patient prior to the procedure and all questions were answered; consent was obtained.  PREOPERATIVE DIAGNOSIS: left intertrochanteric hip fracture   POSTOPERATIVE DIAGNOSIS: Same   PROCEDURE: Open treatment of intertrochanteric fracture with intramedullary implant. CPT [phone]   SURGEON: N. Eduard Roux, M.D.   ASSIST: None  ANESTHESIA: general   IV FLUIDS AND URINE: See anesthesia record   ESTIMATED BLOOD LOSS: 100 cc  IMPLANTS: [employer] InterTAN 10 x 36 cm, 80/75 compression screws  DRAINS: None.   COMPLICATIONS: see description of procedure.   DESCRIPTION OF PROCEDURE: The patient was brought to the operating room and placed supine on the operating table. [name] had been signed prior to the procedure. The patient had the anesthesia placed by the anesthesiologist. The prep verification and incision time-outs were performed to confirm that this was the correct patient, site, side and location. The patient had an SCD on the opposite lower extremity. The patient did receive antibiotics prior to the incision and was re-dosed during the procedure as needed at indicated intervals. The patient was positioned on the fracture table with the table in traction and internal rotation to reduce the hip. The well leg was placed in a scissor position and all bony prominences were well-padded. The patient had the lower extremity prepped and draped in the standard surgical fashion. The incision was made 4 finger breadths superior to the greater trochanter. A guide pin was inserted into the tip of the greater trochanter under fluoroscopic guidance. An opening reamer was used to gain access to the femoral canal. The nail length was measured and inserted down the femoral canal to its proper depth. The appropriate  version of insertion for the lag screw was found under fluoroscopy. A pin was inserted up the femoral neck through the jig. Then, a second antirotation pin was inserted inferior to the first pin. The length of the lag screw was then measured. The lag screw was inserted as near to center-center in the head as possible. The antirotation pin was then taken out and an interdigitating compression screw was placed in its place. The leg was taken out of traction, then the interdigitating compression screw was used to compress across the fracture. Compression was visualized on serial xrays.  The wounds were copiously irrigated with saline and the subcutaneous layer closed with 2.0 vicryl and the skin was reapproximated with staples. The wounds were cleaned and dried a final time and a sterile dressing was placed. The hip was taken through a range of motion at the end of the case under fluoroscopic imaging to visualize the approach-withdraw phenomenon and confirm implant length in the head. The patient was then awakened from anesthesia and taken to the recovery room in stable condition. All counts were correct at the end of the case.   POSTOPERATIVE PLAN: The patient will be weight bearing as tolerated and will return in 2 weeks for staple removal and the patient will receive DVT prophylaxis based on other medications, activity level, and risk ratio of bleeding to thrombosis.   Tracy Cecil, MD [facility] 12:23 PM

## 2022-07-10 NOTE — Anesthesia Preprocedure Evaluation (Addendum)
Anesthesia Evaluation  Patient identified by MRN, date of birth, ID band Patient awake  General Assessment Comment:Hx noted Dr. Nyoka Cowden  Consultant notes noted. Case discussed in detail with patient and her daughter who understood high risk of surgery and possible need for post op ventilation . All understood. Dr. Nyoka Cowden  Reviewed: Allergy & Precautions, NPO status , Patient's Chart, lab work & pertinent test results  Airway Mallampati: II       Dental   Pulmonary neg pulmonary ROS   breath sounds clear to auscultation       Cardiovascular hypertension, +CHF   Rhythm:Regular Rate:Normal     Neuro/Psych negative neurological ROS     GI/Hepatic Neg liver ROS,GERD  ,,  Endo/Other    Renal/GU Renal disease     Musculoskeletal  (+) Arthritis ,    Abdominal   Peds  Hematology   Anesthesia Other Findings   Reproductive/Obstetrics                             Anesthesia Physical Anesthesia Plan  ASA: 3  Anesthesia Plan: General   Post-op Pain Management: Tylenol PO (pre-op)*   Induction: Intravenous  PONV Risk Score and Plan: 3 and Ondansetron, Dexamethasone and Midazolam  Airway Management Planned:   Additional Equipment: Arterial line  Intra-op Plan:   Post-operative Plan: Possible Post-op intubation/ventilation  Informed Consent: I have reviewed the patients History and Physical, chart, labs and discussed the procedure including the risks, benefits and alternatives for the proposed anesthesia with the patient or authorized representative who has indicated his/her understanding and acceptance.     Dental advisory given  Plan Discussed with: CRNA and Anesthesiologist  Anesthesia Plan Comments:        Anesthesia Quick Evaluation

## 2022-07-10 NOTE — Discharge Instructions (Signed)
    1. Change dressings as needed 2. May shower but keep incisions covered and dry 3. Take eliquis to prevent blood clots 4. Take stool softeners as needed 5. Take pain meds as needed   

## 2022-07-10 NOTE — Anesthesia Procedure Notes (Addendum)
Procedure Name: Intubation Date/Time: 07/10/2022 11:38 AM  Performed by: Michele Rockers, CRNAPre-anesthesia Checklist: Patient identified, Patient being monitored, Timeout performed, Emergency Drugs available and Suction available Patient Re-evaluated:Patient Re-evaluated prior to induction Oxygen Delivery Method: Circle system utilized Preoxygenation: Pre-oxygenation with 100% oxygen Induction Type: IV induction Ventilation: Mask ventilation without difficulty Laryngoscope Size: Mac and 3 Grade View: Grade I Tube type: Oral Tube size: 7.0 mm Number of attempts: 1 Airway Equipment and Method: Stylet Placement Confirmation: ETT inserted through vocal cords under direct vision, positive ETCO2 and breath sounds checked- equal and bilateral Secured at: 21 cm Tube secured with: Tape Dental Injury: Teeth and Oropharynx as per pre-operative assessment

## 2022-07-10 NOTE — Plan of Care (Signed)
  Problem: Activity: Goal: Risk for activity intolerance will decrease Outcome: Progressing   Problem: Nutrition: Goal: Adequate nutrition will be maintained Outcome: Progressing   Problem: Coping: Goal: Level of anxiety will decrease Outcome: Progressing   Problem: Safety: Goal: Ability to remain free from injury will improve Outcome: Progressing   

## 2022-07-10 NOTE — Anesthesia Postprocedure Evaluation (Signed)
Anesthesia Post Note  Patient: Tracy Nelson  Procedure(s) Performed: INTRAMEDULLARY (IM) NAIL INTERTROCHANTERIC (Left)     Patient location during evaluation: PACU Anesthesia Type: General Level of consciousness: awake Pain management: pain level controlled Vital Signs Assessment: post-procedure vital signs reviewed and stable Respiratory status: spontaneous breathing Cardiovascular status: stable Postop Assessment: no apparent nausea or vomiting Anesthetic complications: no   No notable events documented.  Last Vitals:  Vitals:   07/10/22 1300 07/10/22 1315  BP: 127/73 130/85  Pulse: 82 93  Resp: 13 16  Temp:  36.6 C  SpO2: 100% 91%    Last Pain:  Vitals:   07/10/22 1315  TempSrc:   PainSc: 4                  Aurore Redinger

## 2022-07-10 NOTE — Progress Notes (Signed)
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  She was found to have a left intertrochanteric fracture.  Orthopedic Dr. Erlinda Hong was consulted plan is for surgery on Friday after Eliquis washout.    Assessment & Plan:   Principal Problem:   Closed left hip fracture, initial encounter ([place]) Active Problems:   Non-ischemic cardiomyopathy (HCC)   HFrEF (heart failure with reduced ejection fraction) (HCC)   HTN (hypertension)   Atrial flutter (HCC)   CKD (chronic kidney disease), stage IV ([place])  1-Closed Left Hip Fracture:  Orthopedic consulted, cardiology consulted for pre op evaluation. Patient is high risk for procedure.  Called by anesthesiologist, patient report epigastric sensation, discomfort. I came to see patient pre op area. Patient report epigastric sensation, discomfort. She doesn't feel any different than before, just that epigastric sensation. She has had it before.  -Cardiology contacted to evaluate patient in Pre op area for clearance.     2-CKD stage IV, acute on chronic.  Metabolic acidosis.  Cr baseline 2.4  Cr Increase to 3.7. continue to hold Diuretics.  Monitor.  Encourage oral intake.   3-Heart failure with reduced ejection fraction, ejection fraction less than 20%, nonischemic cardiomyopathy: Follow-up with Dr. Trey Paula Cardiology consulted for preop assessment, patient felt to be high risk for procedure Hold Bumetadine.  Continue with Verquvo.   4-Paroxysmal A-fib: on Eliquis: Holding Eliquis in anticipation of surgery  5-Rheumatoid arthritis: Present immunosuppressant medication.  6-GERD:  Continue with PPI  7-Hypertension: Continue with Midodrine.   8-Dysphagia; Report difficulty swallowing solid and liquids for months. Plan to start IV PPI BID. Esophagogram after Sx, per patient request.    Estimated body mass index is 17.91 kg/m as calculated from the following:   Height as of this encounter: '5\' 1"'$  (1.549 m).   Weight as of this encounter: 43 kg.   DVT prophylaxis: SCD Code Status: Full code Family Communication: Daughter at bedside.  Disposition Plan:  Status is: Inpatient Remains inpatient appropriate because: management of hip fracture.     Consultants:  Cardiology Dr Erlinda Hong with Ortho  Procedures:    Antimicrobials:    Subjective: She doesn't feel any different. She report epigastric sensation, she denies pain in her chest./   Objective: Vitals:   07/09/22 1503 07/09/22 2100 07/10/22 0450 07/10/22 0911  BP: 105/67 115/81 108/75 123/88  Pulse: 88 85 85 83  Resp:  '18 16 18  '$ Temp: 97.6 F (36.4 C) 98.1 F (36.7 C) (!) 97.5 F (36.4 C) 98 F (36.7 C)  TempSrc: Oral Oral Oral Oral  SpO2: 98% 97% 99% 98%  Weight:    43 kg  Height:    '5\' 1"'$  (1.549 m)   No intake or output data in the 24 hours ending 07/10/22 1042 Filed Weights   07/08/22 1647 07/10/22 0911  Weight: 42.5 kg 43 kg    Examination:  General exam: NAD Respiratory system: CTA Cardiovascular system: S 1, S 2 RRR Gastrointestinal system: BS present, soft, nt Central nervous system: Alert, follows command Extremities: Left LE shorter.     Data Reviewed: I have personally reviewed following labs and imaging studies  CBC: Recent Labs  Lab 07/07/22 1515 07/08/22  0623 07/08/22 2015 07/09/22 0632 07/10/22 0809  WBC 11.3*  --  10.4 11.0* 10.4  NEUTROABS  --   --  9.1*  --   --   HGB 10.5* 10.7* 10.9* 9.8* 8.9*  HCT 33.6*  --  36.0 31.2* 29.3*  MCV 94.4  --  95.7 94.0 95.1  PLT 277  --  246 228 762    Basic Metabolic Panel: Recent Labs  Lab 07/07/22 1515  07/08/22 2015 07/09/22 0632 07/10/22 0809  NA 139 141 140 142  K 4.6 4.5 4.7 5.1  CL 109 108 112* 116*  CO2 18* 20* 18* 17*  GLUCOSE 104* 119* 129* 118*  BUN 65* 69* 65* 70*  CREATININE 3.35* 3.33* 3.36* 3.75*  CALCIUM 9.5 9.6 9.0 9.4  MG  --   --  2.1  --   PHOS  --   --  5.1*  --     GFR: Estimated Creatinine Clearance: 8.7 mL/min (A) (by C-G formula based on SCr of 3.75 mg/dL (H)). Liver Function Tests: Recent Labs  Lab 07/07/22 1515 07/09/22 0632  AST 30  --   ALT 32  --   ALKPHOS 56  --   BILITOT 0.6  --   PROT 6.3*  --   ALBUMIN 3.2* 3.1*    No results for input(s): "LIPASE", "AMYLASE" in the last 168 hours. No results for input(s): "AMMONIA" in the last 168 hours. Coagulation Profile: No results for input(s): "INR", "PROTIME" in the last 168 hours. Cardiac Enzymes: No results for input(s): "CKTOTAL", "CKMB", "CKMBINDEX", "TROPONINI" in the last 168 hours. BNP (last 3 results) No results for input(s): "PROBNP" in the last 8760 hours. HbA1C: No results for input(s): "HGBA1C" in the last 72 hours. CBG: Recent Labs  Lab 07/08/22 2357  GLUCAP 173*    Lipid Profile: No results for input(s): "CHOL", "HDL", "LDLCALC", "TRIG", "CHOLHDL", "LDLDIRECT" in the last 72 hours. Thyroid Function Tests: Recent Labs    07/07/22 1515  TSH 1.167    Anemia Panel: No results for input(s): "VITAMINB12", "FOLATE", "FERRITIN", "TIBC", "IRON", "RETICCTPCT" in the last 72 hours. Sepsis Labs: No results for input(s): "PROCALCITON", "LATICACIDVEN" in the last 168 hours.  Recent Results (from the past 240 hour(s))  Surgical pcr screen     Status: None   Collection Time: 07/10/22  5:28 AM   Specimen: Nasal Mucosa; Nasal Swab  Result Value Ref Range Status   MRSA, PCR NEGATIVE NEGATIVE Final   Staphylococcus aureus NEGATIVE NEGATIVE Final    Comment: (NOTE) The Xpert SA Assay (FDA approved for NASAL specimens in patients 27 years of age and older), is one component of a  comprehensive surveillance program. It is not intended to diagnose infection nor to guide or monitor treatment. Performed at [facility], [place] [address] [place], [place] [address]          Radiology Studies: CT Hip Left Wo Contrast  Result Date: 07/08/2022 CLINICAL DATA:  Left hip fracture EXAM: CT OF THE LEFT HIP WITHOUT CONTRAST TECHNIQUE: Multidetector CT imaging of the left hip was performed according to the standard protocol. Multiplanar CT image reconstructions were also generated. RADIATION DOSE REDUCTION: This exam was performed according to the departmental dose-optimization program which includes automated exposure control, adjustment of the mA and/or kV according to patient size and/or use of iterative reconstruction technique. COMPARISON:  Same-day x-ray FINDINGS: Bones/Joint/Cartilage Acute comminuted intertrochanteric fracture of the proximal left femur. Mild superior migration of the distal fracture component with near 90  degrees of varus angulation. Greater trochanteric fragment is mildly displaced posteriorly. Nondisplaced lesser trochanteric component. Left hip joint alignment is maintained without dislocation. Severe osteoarthritis of the left hip. Visualized portion of the left hemipelvis is intact without fracture or diastasis. No lytic or sclerotic bone lesion is identified. Ligaments Suboptimally assessed by CT. Muscles and Tendons No acute musculotendinous abnormality by CT. Soft tissues Mild soft tissue swelling at the lateral aspect of the hip. No organized fluid collection or hematoma. Atherosclerotic vascular calcifications. Scattered colonic diverticulosis. Multiple calcified uterine fibroids. IMPRESSION: 1. Acute comminuted intertrochanteric fracture of the proximal left femur with varus angulation. 2. Severe osteoarthritis of the left hip. Electronically Signed   By: Davina Poke D.O.   On: 07/08/2022 19:34   CT Cervical Spine Wo Contrast  Result Date:  07/08/2022 CLINICAL DATA:  Fall.  Neck trauma EXAM: CT CERVICAL SPINE WITHOUT CONTRAST TECHNIQUE: Multidetector CT imaging of the cervical spine was performed without intravenous contrast. Multiplanar CT image reconstructions were also generated. RADIATION DOSE REDUCTION: This exam was performed according to the departmental dose-optimization program which includes automated exposure control, adjustment of the mA and/or kV according to patient size and/or use of iterative reconstruction technique. COMPARISON:  None Available. FINDINGS: Alignment: Facet joints are aligned without dislocation or traumatic listhesis. Dens and lateral masses are aligned. Skull base and vertebrae: No acute fracture. No primary bone lesion or focal pathologic process. Soft tissues and spinal canal: No prevertebral fluid or swelling. No visible canal hematoma. Disc levels: Mild multilevel disc height loss with uncovertebral spurring. Advanced multilevel bilateral facet arthropathy. Upper chest: Negative. Other: Bilateral carotid atherosclerosis. IMPRESSION: 1. No acute fracture or traumatic listhesis of the cervical spine. 2. Facet-predominant multilevel cervical spondylosis. Electronically Signed   By: Davina Poke D.O.   On: 07/08/2022 19:31   CT HEAD WO CONTRAST (5MM)  Result Date: 07/08/2022 CLINICAL DATA:  Head trauma, minor (Age >= 65y) EXAM: CT HEAD WITHOUT CONTRAST TECHNIQUE: Contiguous axial images were obtained from the base of the skull through the vertex without intravenous contrast. RADIATION DOSE REDUCTION: This exam was performed according to the departmental dose-optimization program which includes automated exposure control, adjustment of the mA and/or kV according to patient size and/or use of iterative reconstruction technique. COMPARISON:  11/01/2021 FINDINGS: Brain: There is periventricular white matter decreased attenuation consistent with small vessel ischemic changes. Ventricles, sulci and cisterns are  prominent consistent with age related involutional changes. No acute intracranial hemorrhage, mass effect or shift. No hydrocephalus. Vascular: No hyperdense vessel or unexpected calcification. Skull: Normal. Negative for fracture or focal lesion. Sinuses/Orbits: No acute finding. IMPRESSION: Atrophy and chronic small vessel ischemic changes. No acute intracranial process identified. Electronically Signed   By: Sammie Bench M.D.   On: 07/08/2022 19:28   DG Hip Unilat W or Wo Pelvis 2-3 Views Left  Result Date: 07/08/2022 CLINICAL DATA:  Left hip pain after fall EXAM: DG HIP (WITH OR WITHOUT PELVIS) 2-3V LEFT COMPARISON:  None Available. FINDINGS: Th acute intertrochanteric fracture of the left femur. There is slight lateral and superior displacement of the distal fragment. Probable additional fracture of the lesser trochanter. The left femoral head remains located within the acetabulum. Uterine fibroids. IMPRESSION: Left intertrochanteric femur fracture. Electronically Signed   By: Placido Sou M.D.   On: 07/08/2022 18:56   DG Chest 1 View  Result Date: 07/08/2022 CLINICAL DATA:  [place] onto left side, left hip fracture, preoperative evaluation EXAM: CHEST  1 VIEW COMPARISON:  11/01/2021 FINDINGS: Single frontal  view of the chest demonstrates a stable cardiac silhouette. Stable ectasia of the thoracic aorta. No airspace disease, effusion, or pneumothorax. There are no acute bony abnormalities. IMPRESSION: 1. No acute intrathoracic process. Electronically Signed   By: Randa Ngo M.D.   On: 07/08/2022 18:55        Scheduled Meds:  [MAR Hold] amiodarone  200 mg Oral Daily   [MAR Hold] azaTHIOprine  100 mg Oral Daily   [MAR Hold] cholestyramine  4 g Oral Daily   [MAR Hold] hydroxychloroquine  200 mg Oral Daily   [MAR Hold] midodrine  5 mg Oral TID WC   [MAR Hold] multivitamin with minerals  1 tablet Oral Daily   [MAR Hold] pantoprazole (PROTONIX) IV  40 mg Intravenous Q12H   [MAR Hold]  predniSONE  5 mg Oral Q breakfast   [MAR Hold] senna-docusate  1 tablet Oral QHS   [MAR Hold] Vericiguat  10 mg Oral Daily   Continuous Infusions:  sodium chloride      ceFAZolin (ANCEF) IV     tranexamic acid       LOS: 2 days    Time spent: 35 minutes    Rmoni Keplinger A Ethon Wymer, MD [facility]   If 7PM-7AM, please contact night-coverage [email or web address]  07/10/2022, 10:42 AM

## 2022-07-10 NOTE — H&P (Signed)

## 2022-07-10 NOTE — Progress Notes (Signed)
Paged about patient with "chest pain" while in pre-op.  Saw patient in pre-op, when asked about chest pain she stated "it's not chest pain, its a sinking feeling above my stomach." Has been having difficulty eating recently and has GI issues.   Got EKG which showed: NSR w/ occasional PVCs. Unchanged from prior  Doubt discomfort is cardiac related.   Remains very high risk given cardiac issues but no absolute contraindication to proceed.  Forestine Na, AGACNP-BC  Advanced Heart Failure Team 07/10/22 10:52 AM

## 2022-07-10 NOTE — Anesthesia Procedure Notes (Deleted)
Arterial Line Insertion Performed by: Valda Favia, CRNA, CRNA  Patient location: Pre-op. Preanesthetic checklist: patient identified, IV checked, site marked, risks and benefits discussed, surgical consent, monitors and equipment checked, pre-op evaluation, timeout performed and anesthesia consent Lidocaine 1% used for infiltration Right, radial was placed Catheter size: 20 G Hand hygiene performed , maximum sterile barriers used  and Seldinger technique used Allen's test indicative of satisfactory collateral circulation Attempts: 2 (1 attemp by Norm Salt, CRNA) Procedure performed without using ultrasound guided technique. Following insertion, Biopatch and dressing applied. Post procedure assessment: normal  Patient tolerated the procedure well with no immediate complications.

## 2022-07-10 NOTE — Transfer of Care (Signed)
Immediate Anesthesia Transfer of Care Note  Patient: Tracy Nelson  Procedure(s) Performed: INTRAMEDULLARY (IM) NAIL INTERTROCHANTERIC (Left)  Patient Location: PACU  Anesthesia Type:GA combined with regional for post-op pain  Level of Consciousness: awake, alert , patient cooperative, and responds to stimulation  Airway & Oxygen Therapy: Patient Spontanous Breathing and Patient connected to nasal cannula oxygen  Post-op Assessment: Report given to RN, Post -op Vital signs reviewed and stable, and Patient moving all extremities X 4  Post vital signs: Reviewed and stable  Last Vitals:  Vitals Value Taken Time  BP 139/80 07/10/22 1241  Temp    Pulse 77 07/10/22 1242  Resp 14 07/10/22 1242  SpO2 100 % 07/10/22 1242  Vitals shown include unvalidated device data.  Last Pain:  Vitals:   07/10/22 0925  TempSrc:   PainSc: 0-No pain      Patients Stated Pain Goal: 2 (19/41/74 0814)  Complications: No notable events documented.

## 2022-07-10 NOTE — Progress Notes (Addendum)
Advanced Heart Failure Rounding Note  PCP-Cardiologist: None   Subjective:     Complaining of nausea and hip pain.    Objective:   Weight Range: 42.5 kg Body mass index is 17.7 kg/m.   Vital Signs:   Temp:  [97.5 F (36.4 C)-98.1 F (36.7 C)] 97.5 F (36.4 C) (12/01 0450) Pulse Rate:  [85-88] 85 (12/01 0450) Resp:  [16-18] 16 (12/01 0450) BP: (105-115)/(67-81) 108/75 (12/01 0450) SpO2:  [97 %-99 %] 99 % (12/01 0450) Last BM Date : 07/08/22  Weight change: Filed Weights   07/08/22 1647  Weight: 42.5 kg    Intake/Output:  No intake or output data in the 24 hours ending 07/10/22 0754    Physical Exam    General:  Thin. Appears weak.  No resp difficulty HEENT: Normal Neck: Supple. JVP flat  . Carotids 2+ bilat; no bruits. No lymphadenopathy or thyromegaly appreciated. Cor: PMI nondisplaced. Regular rate & rhythm. No rubs, gallops or murmurs. Lungs: Clear Abdomen: Soft, nontender, nondistended. No hepatosplenomegaly. No bruits or masses. Good bowel sounds. Extremities: No cyanosis, clubbing, rash, edema Neuro: Alert & orientedx3, cranial nerves grossly intact. moves all 4 extremities w/o difficulty. Affect flat    Telemetry  N/A   EKG    N/A  Labs    CBC Recent Labs    07/08/22 2015 07/09/22 0632  WBC 10.4 11.0*  NEUTROABS 9.1*  --   HGB 10.9* 9.8*  HCT 36.0 31.2*  MCV 95.7 94.0  PLT 246 025   Basic Metabolic Panel Recent Labs    07/08/22 2015 07/09/22 0632  NA 141 140  K 4.5 4.7  CL 108 112*  CO2 20* 18*  GLUCOSE 119* 129*  BUN 69* 65*  CREATININE 3.33* 3.36*  CALCIUM 9.6 9.0  MG  --  2.1  PHOS  --  5.1*   Liver Function Tests Recent Labs    07/07/22 1515 07/09/22 0632  AST 30  --   ALT 32  --   ALKPHOS 56  --   BILITOT 0.6  --   PROT 6.3*  --   ALBUMIN 3.2* 3.1*   No results for input(s): "LIPASE", "AMYLASE" in the last 72 hours. Cardiac Enzymes No results for input(s): "CKTOTAL", "CKMB", "CKMBINDEX", "TROPONINI"  in the last 72 hours.  BNP: BNP (last 3 results) Recent Labs    11/06/21 1241 03/04/22 1629 07/07/22 1515  BNP 190.3* 193.6* 232.0*    ProBNP (last 3 results) No results for input(s): "PROBNP" in the last 8760 hours.   D-Dimer No results for input(s): "DDIMER" in the last 72 hours. Hemoglobin A1C No results for input(s): "HGBA1C" in the last 72 hours. Fasting Lipid Panel No results for input(s): "CHOL", "HDL", "LDLCALC", "TRIG", "CHOLHDL", "LDLDIRECT" in the last 72 hours. Thyroid Function Tests Recent Labs    07/07/22 1515  TSH 1.167    Other results:   Imaging    No results found.   Medications:     Scheduled Medications:  amiodarone  200 mg Oral Daily   azaTHIOprine  100 mg Oral Daily   cholestyramine  4 g Oral Daily   hydroxychloroquine  200 mg Oral Daily   midodrine  5 mg Oral TID WC   multivitamin with minerals  1 tablet Oral Daily   pantoprazole (PROTONIX) IV  40 mg Intravenous Q12H   predniSONE  5 mg Oral Q breakfast   senna-docusate  1 tablet Oral QHS   Vericiguat  10 mg Oral Daily  Infusions:   ceFAZolin (ANCEF) IV     tranexamic acid      PRN Medications: acetaminophen, HYDROmorphone (DILAUDID) injection, melatonin, oxyCODONE-acetaminophen, polyethylene glycol, prochlorperazine    Patient Profile   76 y/o female w/ 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 Ortho has seen, planning possible IMN tomorrow. AHF team asked to see for CV risk stratification/ surgical clearance.   Assessment/Plan   1. CV Risk Assessment - Chronic systolic heart failure, EF severely reduced < 20% on last echo w/ mod MR and mod TR. Frail w/ chronic NYHA Class IIIB symptoms, felt to be nearing end-stage. Surgery not prohibitive but she is very high risk given cardiac issues  2. Lt Hip Fx: 2/2 mechanical fall -  ortho following, plan IMN today  - Plan for surgery today.  3. Chronic systolic CHF: Nonischemic cardiomyopathy.  This has been known since 2015, cath in 2015 showed no coronary disease and cardiac MRI in 2015 showed no LGE.  Cause is uncertain, familial cardiomyopathy is a concern given nonischemic cardiomyopathy in her mother.  Cannot rule out remote viral myocarditis.Poor appetite and severe HF limitation on CPX in 3/22 is concerning, as is elevated creatinine (suspect cardiorenal syndrome).  Low BP and elevated creatinine have limited her cardiac med regimen. RHC (1/22) showed low filling pressures and actually a relatively preserved cardiac index of 2.22.  PYP scan in 4/22 was equivocal, PYP scan repeated in 3/23 and likely negative.  Cardiac MRI in 10/22 showed mild LV dilation with EF 23%, normal RV size with RVEF 37%, ECV 34%, small area of subendocardial LGE in the mid inferolateral wall and small area of full thickness LGE in the basal inferior wall.   This was not suggestive of cardiac amyloidosis but possibly could be consistent with coronary embolism (though no LV thrombus visualized) versus prior myocarditis versus sarcoidosis. She does not appear volume overloaded on exam.  NYHA class IIIb chronically. She is quite frail. GDMT limited by renal dysfunction and hypotension.  - Continue midodrine 5 mg tid.  - Hold diuretics.  - Continue Verquvo 10 mg daily.  - Off dapagliflozin with rise in creatinine.   - Off spironolactone with elevated creatinine and soft BP.   - Though RHC in 1/22 was not markedly abnormal, CPX in 3/22 showed severe functional limitation due to HF and BP also runs quite low.  Concerned she is nearing end-stage.  She is frail with CKD stage 4, I do not think that she would be a candidate for LVAD.  - With renal failure, CHF, and atrial arrhythmias, we have assessed for cardiac amyloidosis.  PYP scan was equivocal in 4/22 for transthyretin cardiac amyloidosis.  It was repeated  in 3/23 and likely negative.  [employer] gene testing for TTR mutations was negative. The cardiac MRI was not consistent with amyloidosis.  She has MGUS with monoclonal IgG paraprotein, but based on cMRI and slow progression, think AL amyloidosis is unlikely.   - She is interested in barostimulation activation therapy, will have nurse discuss with her.   - Does not have an ICD, would not place given NICM, advanced age, and concern for nearing end stage HF.  - Repeat ECHO.  4. CKD: Stage 4.  She is followed by Dr. Royce Macadamia. - SCr 3.3, stable this admission  - BMET pending.  5. Rheumatoid arthritis: No history of lung involvement. She  has been on a low dose of prednisone chronically.  6. Atrial fibrillation/flutter: S/p TEE-DCCV in 4/22.   - Regular on exam.  - Continue amiodarone 200 mg daily.  - Holding Eliquis for possible surgery. On  2.5 mg bid PTA (elevated creatinine, low weight).  7. 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    Labs pending.  Length of Stay: 2  Darrick Grinder, NP  07/10/2022, 7:54 AM  Advanced Heart Failure Team Pager [phone] (M-F; 7a - 5p)  Please contact [facility] for night-coverage after hours (5p -7a ) and weekends on [email or web address]  Patient seen with NP, agree with the above note.   She is going for high risk hip repair today. Symptomatically stable though creatinine is up to 3.75.   General: Frail Neck: No JVD, no thyromegaly or thyroid nodule.  Lungs: Clear to auscultation bilaterally with normal respiratory effort. KN:[id number] PMI.  Heart regular S1/S2, no S3/S4, no murmur.  No peripheral edema.   Abdomen: Soft, nontender, no hepatosplenomegaly, no distention.  Skin: Intact without lesions or rashes.  Neurologic: Alert and oriented x 3.  Psych: Normal affect. Extremities: No clubbing or cyanosis.  HEENT: Normal.   I am going to get echo this admission (due for echo, will not affect decision-making in terms of surgery).   High risk hip surgery today.   Alternative would be immobility.    Continue to hold diuretic with higher creatinine, will need lower dose at home. BP stable on midodrine.   Loralie Champagne 07/10/2022

## 2022-07-10 NOTE — TOC CM/SW Note (Signed)
.   Transition of Care [facility]) Screening Note   Patient Details  Name: MINETTA KRISHER Date of Birth: 14-Sep-1945   Transition of Care [facility]) CM/SW Contact:    Erenest Rasher, RN Phone Number: [phone] 07/10/2022, 5:11 PM    Transition of Care Department [facility]) has reviewed patient and no TOC needs have been identified at this time. We will continue to monitor patient advancement through interdisciplinary progression rounds. If new patient transition needs arise, please place a TOC consult.  Will need PT/OT recommendations, SNF vs HH. Will continue to follow for dc needs.

## 2022-07-11 ENCOUNTER — Inpatient Hospital Stay (HOSPITAL_COMMUNITY): Payer: Medicare PPO

## 2022-07-11 ENCOUNTER — Encounter (HOSPITAL_COMMUNITY): Payer: Self-pay | Admitting: Internal Medicine

## 2022-07-11 DIAGNOSIS — I5021 Acute systolic (congestive) heart failure: Secondary | ICD-10-CM | POA: Diagnosis not present

## 2022-07-11 DIAGNOSIS — S72002A Fracture of unspecified part of neck of left femur, initial encounter for closed fracture: Secondary | ICD-10-CM | POA: Diagnosis not present

## 2022-07-11 LAB — ECHOCARDIOGRAM COMPLETE
Area-P 1/2: 3.91 cm2
Height: 61 in
S' Lateral: 2.9 cm
Weight: 1516.76 oz

## 2022-07-11 LAB — CBC
HCT: 24.6 % — ABNORMAL LOW (ref 36.0–46.0)
Hemoglobin: 7.6 g/dL — ABNORMAL LOW (ref 12.0–15.0)
MCH: 29.5 pg (ref 26.0–34.0)
MCHC: 30.9 g/dL (ref 30.0–36.0)
MCV: 95.3 fL (ref 80.0–100.0)
Platelets: 224 10*3/uL (ref 150–400)
RBC: 2.58 MIL/uL — ABNORMAL LOW (ref 3.87–5.11)
RDW: 17.2 % — ABNORMAL HIGH (ref 11.5–15.5)
WBC: 9.2 10*3/uL (ref 4.0–10.5)
nRBC: 0 % (ref 0.0–0.2)

## 2022-07-11 LAB — BASIC METABOLIC PANEL
Anion gap: 6 (ref 5–15)
BUN: 65 mg/dL — ABNORMAL HIGH (ref 8–23)
CO2: 16 mmol/L — ABNORMAL LOW (ref 22–32)
Calcium: 9.1 mg/dL (ref 8.9–10.3)
Chloride: 120 mmol/L — ABNORMAL HIGH (ref 98–111)
Creatinine, Ser: 3.78 mg/dL — ABNORMAL HIGH (ref 0.44–1.00)
GFR, Estimated: 12 mL/min — ABNORMAL LOW (ref >60)
Glucose, Bld: 127 mg/dL — ABNORMAL HIGH (ref 70–99)
Potassium: 5.1 mmol/L (ref 3.5–5.1)
Sodium: 142 mmol/L (ref 135–145)

## 2022-07-11 LAB — PREPARE RBC (CROSSMATCH)

## 2022-07-11 MED ORDER — POLYETHYLENE GLYCOL 3350 17 G PO PACK
17.0000 g | PACK | Freq: Two times a day (BID) | ORAL | Status: DC
  Administered 2022-07-11 – 2022-07-14 (×3): 17 g via ORAL
  Filled 2022-07-11 (×6): qty 1

## 2022-07-11 MED ORDER — SODIUM CHLORIDE 0.9 % IV SOLN: INTRAVENOUS | Status: DC

## 2022-07-11 MED ORDER — FERROUS SULFATE 325 (65 FE) MG PO TABS
325.0000 mg | ORAL_TABLET | Freq: Every day | ORAL | Status: DC
  Administered 2022-07-11 – 2022-07-15 (×5): 325 mg via ORAL
  Filled 2022-07-11 (×5): qty 1

## 2022-07-11 MED ORDER — SODIUM CHLORIDE 0.9% IV SOLUTION: Freq: Once | INTRAVENOUS | Status: AC

## 2022-07-11 NOTE — Progress Notes (Signed)
   Subjective:  Patient reports pain as moderate.   Feels a gagging sensation. No events overnight.  Objective:   VITALS:   Vitals:   07/10/22 1315 07/10/22 2200 07/11/22 0230 07/11/22 0526  BP: 130/85 112/70 114/73 112/71  Pulse: 93     Resp: '16 13 14 13  '$ Temp: 97.8 F (36.6 C) 98.4 F (36.9 C) 97.8 F (36.6 C) 98.2 F (36.8 C)  TempSrc:  Oral Oral Oral  SpO2: 91% 100% 100% 100%  Weight:      Height:        Neurologically intact Neurovascular intact Sensation intact distally Intact pulses distally Dorsiflexion/Plantar flexion intact Incision: dressing C/D/I and no drainage Compartment soft   Lab Results  Component Value Date   WBC 9.2 07/11/2022   HGB 7.6 (L) 07/11/2022   HCT 24.6 (L) 07/11/2022   MCV 95.3 07/11/2022   PLT 224 07/11/2022     Assessment/Plan:  1 Day Post-Op   - Expected postop acute blood loss anemia - would recommend transfusing 1 unit RBCs if appropriate from medial stand point - Up with PT/OT as soon as possible - DVT ppx - SCDs, ambulation, may resume eliquis once Hg above 8 - WBAT operative extremity - Pain control - anticipate will need SNF  Eduard Roux 07/11/2022, 8:58 AM

## 2022-07-11 NOTE — Progress Notes (Signed)
ANTICOAGULATION CONSULT NOTE - Initial Consult  Pharmacy Consult for Apixaban Indication: history of  Atrial fibrillation  Allergies  Allergen Reactions   Baclofen Nausea And Vomiting   Penicillin G     Other reaction(s): Unknown   Penicillins Rash    Patient Measurements: Height: '5\' 1"'$  (154.9 cm) Weight: 43 kg (94 lb 12.8 oz) IBW/kg (Calculated) : 47.8   Vital Signs: Temp: 98.2 F (36.8 C) (12/02 0526) Temp Source: Oral (12/02 0526) BP: 112/71 (12/02 0526)  Labs: Recent Labs    07/09/22 0632 07/10/22 0809 07/11/22 0248  HGB 9.8* 8.9* 7.6*  HCT 31.2* 29.3* 24.6*  PLT 228 244 224  CREATININE 3.36* 3.75* 3.78*    Estimated Creatinine Clearance: 8.6 mL/min (A) (by C-G formula based on SCr of 3.78 mg/dL (H)).   Medical History: Past Medical History:  Diagnosis Date   Acid reflux 07/15/2020   Arthritis 07/15/2020   CHF (congestive heart failure) (HCC)    Colitis 07/15/2020   Dilated cardiomyopathy ([place]) 07/15/2020   HFrEF (heart failure with reduced ejection fraction) ([place]) 07/15/2020   HTN (hypertension) 07/15/2020   Osteoporosis 07/15/2020   Palpitations 07/15/2020   Rheumatoid arthritis ([place]) 07/15/2020    Medications:  Medications Prior to Admission  Medication Sig Dispense Refill Last Dose   amiodarone (PACERONE) 200 MG tablet TAKE 1 TABLET BY MOUTH EVERY DAY (Patient taking differently: Take 200 mg by mouth daily.) 60 tablet 6 07/08/2022 at 1500   azaTHIOprine (IMURAN) 50 MG tablet TAKE 2 TABLETS BY MOUTH EVERY DAY (Patient taking differently: Take 100 mg by mouth daily.) 60 tablet 0 07/08/2022 at 1500   bumetanide (BUMEX) 2 MG tablet Take 1 tablet (2 mg total) by mouth daily. (Patient taking differently: Take 1 mg by mouth daily.) 90 tablet 3 07/08/2022 at 1500   calcitRIOL (ROCALTROL) 0.25 MCG capsule Take 0.25 mcg by mouth daily.   07/08/2022   Cholecalciferol (VITAMIN D3) 25 MCG (1000 UT) CAPS Take 1 capsule by mouth daily in the afternoon.   07/08/2022 at  1500   cyclobenzaprine (FLEXERIL) 10 MG tablet Take 1 tablet (10 mg total) by mouth 3 (three) times daily as needed for muscle spasms. 30 tablet 0 unk   ELIQUIS 2.5 MG TABS tablet TAKE 1 TABLET BY MOUTH TWICE A DAY (Patient taking differently: Take 2.5 mg by mouth 2 (two) times daily.) 60 tablet 5 07/08/2022 at 1500   Ensure (ENSURE) Take 237 mLs by mouth 2 (two) times daily between meals.   unk   folic acid (FOLVITE) 1 MG tablet TAKE 1 TABLET BY MOUTH EVERY DAY (Patient taking differently: Take 1 mg by mouth daily.) 30 tablet 11 07/08/2022 at 1500   hydroxychloroquine (PLAQUENIL) 200 MG tablet TAKE 1 TABLET BY MOUTH EVERY DAY (Patient taking differently: Take 200 mg by mouth daily.) 30 tablet 5 07/08/2022   hydroxypropyl methylcellulose / hypromellose (ISOPTO TEARS / GONIOVISC) 2.5 % ophthalmic solution Place 1 drop into both eyes as needed for dry eyes.   unk   lidocaine (LIDODERM) 5 % Place 1 patch onto the skin daily. Remove & Discard patch within 12 hours or as directed by MD 30 patch 0 unk   Menthol, Topical Analgesic, (ICY HOT BACK EX) Apply 1 application topically daily as needed (pain).   unk   midodrine (PROAMATINE) 5 MG tablet Take 1 tablet (5 mg total) by mouth 3 (three) times daily with meals. 180 tablet 3 07/08/2022 at 1500   Multiple Vitamin (MULTIVITAMIN) tablet Take 1 tablet by mouth  daily.   07/08/2022 at 1500   omeprazole (PRILOSEC) 40 MG capsule Take 40 mg by mouth daily.   unk   polyethylene glycol (MIRALAX / GLYCOLAX) 17 g packet Take 17 g by mouth as needed.   Past Week   predniSONE (DELTASONE) 5 MG tablet TAKE 1 TABLET BY MOUTH EVERY DAY WITH BREAKFAST (Patient taking differently: Take 5 mg by mouth daily with breakfast.) 30 tablet 1 07/08/2022 at 1500   TUMS E-X 750 750 MG chewable tablet Chew 2 tablets by mouth 3 (three) times daily as needed.   unk   Vericiguat (VERQUVO) 10 MG TABS Take 10 mg by mouth daily. 90 tablet 3 07/08/2022 at 1500   cholestyramine (QUESTRAN) 4 g  packet Take 1 packet (4 g total) by mouth daily. (Patient not taking: Reported on 07/09/2022) 60 each 12 Not Taking    Assessment: 76 y.o female   who sustained a left hip fracture.  Today is POD #1 s/p IM nailing of left hip on 07/10/22.   She is on apixaban prior to admission for Afib/flutter.   Apixaban was held preop for washout prior to this surgery.   Pharmacy consulted to resume Apixaban on POD #1 pending AM hgb being stable >/= 8 and not requiring blood transfusion.   POD #1 Hgb is 7.6 today     Plan:  Will hold off on restart of her apixaban due to Hgb <8 per Ortho's orders.  F/u CBC in AM  Nicole Cella, [place] Clinical Pharmacist 07/11/2022,7:55 AM

## 2022-07-11 NOTE — Consult Note (Signed)
Renal Service Consult Note [facility]  Tracy Nelson 07/11/2022 [name], MD Requesting Physician: Dr. Tyrell Antonio  Reason for Consult: Renal failure HPI: The patient is a 76 y.o. year-old w/ hx of DCM, HFrEF, RA, colitis who presented 11/29 to ED after a fall at home onto the L hip. In ED xrays showed L hip fx. Pt was seen by CHF team, felt to be high-risk surgery given severe NICM w/ EF < 20% on last echo. She was felt to be nearing end-stage HF. Pt also has CKD stage 4. Would not be an LVAD candidate. ICD not recommended given NICM, advanced age, frailty and concern for nearing end stage HF. Creat was 3.3 on admission 11/28, up to 3.75 yest and stable creat at 3.78 today. We are asked to see for renal failure.   Pt seen in room. Has been voiding here. No SOB or cough, no edema.   ROS - denies CP, no joint pain, no HA, no blurry vision, no rash, no diarrhea, no nausea/ vomiting, no dysuria, no difficulty voiding   Past Medical History  Past Medical History:  Diagnosis Date   Acid reflux 07/15/2020   Arthritis 07/15/2020   CHF (congestive heart failure) ([place])    Colitis 07/15/2020   Dilated cardiomyopathy ([place]) 07/15/2020   HFrEF (heart failure with reduced ejection fraction) ([place]) 07/15/2020   HTN (hypertension) 07/15/2020   Osteoporosis 07/15/2020   Palpitations 07/15/2020   Rheumatoid arthritis ([place]) 07/15/2020   Past Surgical History  Past Surgical History:  Procedure Laterality Date   CARDIOVERSION N/A 11/08/2020   Procedure: CARDIOVERSION;  Surgeon: [name], MD;  Location: [place];  Service: Cardiovascular;  Laterality: N/A;   INTRAMEDULLARY (IM) NAIL INTERTROCHANTERIC Left 07/10/2022   Procedure: INTRAMEDULLARY (IM) NAIL INTERTROCHANTERIC;  Surgeon: Leandrew Koyanagi, MD;  Location: [place];  Service: Orthopedics;  Laterality: Left;   RIGHT HEART CATH N/A 08/14/2020   Procedure: RIGHT HEART CATH;  Surgeon: [name], MD;  Location: [place]  [facility];  Service: Cardiovascular;  Laterality: N/A;   TEE WITHOUT CARDIOVERSION N/A 11/08/2020   Procedure: TRANSESOPHAGEAL ECHOCARDIOGRAM (TEE);  Surgeon: [name], MD;  [facility] ENDOSCOPY;  Service: Cardiovascular;  Laterality: N/A;   Family History  Family History  Problem Relation Age of Onset   Hypertension Mother    Diabetes Mother    Hypertension Father    Diabetes Father    Breast cancer Maternal Aunt    Breast cancer Paternal Aunt    Arthritis Maternal Grandmother    Lung disease Paternal Grandfather    Cancer Brother    Social History  reports that she has never smoked. She has never used smokeless tobacco. She reports that she does not drink alcohol and does not use drugs. Allergies  Allergies  Allergen Reactions   Baclofen Nausea And Vomiting   Penicillin G     Other reaction(s): Unknown   Penicillins Rash   Home medications Prior to Admission medications   Medication Sig Start Date End Date Taking? Authorizing Provider  amiodarone (PACERONE) 200 MG tablet TAKE 1 TABLET BY MOUTH EVERY DAY Patient taking differently: Take 200 mg by mouth daily. 12/19/21  Yes [name], MD  azaTHIOprine (IMURAN) 50 MG tablet TAKE 2 TABLETS BY MOUTH EVERY DAY Patient taking differently: Take 100 mg by mouth daily. 04/20/22  Yes Rice, Resa Miner, MD  bumetanide (BUMEX) 2 MG tablet Take 1 tablet (2 mg total) by mouth daily. Patient taking differently:  Take 1 mg by mouth daily. 02/06/22  Yes [name], MD  calcitRIOL (ROCALTROL) 0.25 MCG capsule Take 0.25 mcg by mouth daily. 06/25/22  Yes [provider]  Cholecalciferol (VITAMIN D3) 25 MCG (1000 UT) CAPS Take 1 capsule by mouth daily in the afternoon.   Yes [provider]  cyclobenzaprine (FLEXERIL) 10 MG tablet Take 1 tablet (10 mg total) by mouth 3 (three) times daily as needed for muscle spasms. 12/29/21  Yes [name], [name]  ELIQUIS 2.5 MG TABS tablet TAKE 1 TABLET BY MOUTH TWICE A  DAY Patient taking differently: Take 2.5 mg by mouth 2 (two) times daily. 12/31/21  Yes [name], MD  Ensure (ENSURE) Take 237 mLs by mouth 2 (two) times daily between meals.   Yes [provider]  folic acid (FOLVITE) 1 MG tablet TAKE 1 TABLET BY MOUTH EVERY DAY Patient taking differently: Take 1 mg by mouth daily. 07/30/21  Yes [name], MD  hydroxychloroquine (PLAQUENIL) 200 MG tablet TAKE 1 TABLET BY MOUTH EVERY DAY Patient taking differently: Take 200 mg by mouth daily. 05/29/22  Yes [name], MD  hydroxypropyl methylcellulose / hypromellose (ISOPTO TEARS / GONIOVISC) 2.5 % ophthalmic solution Place 1 drop into both eyes as needed for dry eyes.   Yes [provider]  lidocaine (LIDODERM) 5 % Place 1 patch onto the skin daily. Remove & Discard patch within 12 hours or as directed by MD 03/09/22  Yes [name], [name]  Menthol, Topical Analgesic, (ICY HOT BACK EX) Apply 1 application topically daily as needed (pain).   Yes [provider]  midodrine (PROAMATINE) 5 MG tablet Take 1 tablet (5 mg total) by mouth 3 (three) times daily with meals. 02/04/22  Yes [name], MD  Multiple Vitamin (MULTIVITAMIN) tablet Take 1 tablet by mouth daily.   Yes [provider]  omeprazole (PRILOSEC) 40 MG capsule Take 40 mg by mouth daily.   Yes [provider]  oxyCODONE (OXY IR/ROXICODONE) 5 MG immediate release tablet Take 1-2 tablets (5-10 mg total) by mouth every 8 (eight) hours as needed for severe pain. 07/10/22  Yes Leandrew Koyanagi, MD  polyethylene glycol (MIRALAX / GLYCOLAX) 17 g packet Take 17 g by mouth as needed.   Yes [provider]  predniSONE (DELTASONE) 5 MG tablet TAKE 1 TABLET BY MOUTH EVERY DAY WITH BREAKFAST Patient taking differently: Take 5 mg by mouth daily with breakfast. 05/04/22  Yes Rice, Resa Miner, MD  TUMS E-X 750 750 MG chewable tablet Chew 2 tablets by mouth 3 (three) times daily as needed. 03/26/22   Yes [provider]  Vericiguat (VERQUVO) 10 MG TABS Take 10 mg by mouth daily. 09/03/21  Yes [name], MD  cholestyramine Lucrezia Starch) 4 g packet Take 1 packet (4 g total) by mouth daily. Patient not taking: Reported on 07/09/2022 08/08/20   [name], MD     Vitals:   07/11/22 1221 07/11/22 1230 07/11/22 1445 07/11/22 1500  BP:   (!) 149/81 (!) 149/81  Pulse:  72 71   Resp:    17  Temp: 98.5 F (36.9 C) 98 F (36.7 C) 98.4 F (36.9 C)   TempSrc:  Oral Oral Oral  SpO2:   99% 100%  Weight:      Height:       Exam Gen alert, frail elderly AAF No rash, cyanosis or gangrene Sclera anicteric, throat clear  No jvd or bruits Chest clear  bilat to bases, no rales/ wheezing RRR no MRG Abd soft ntnd no mass or ascites +bs GU defer MS no joint effusions or deformity Ext no LE or UE edema, no wounds or ulcers Neuro is alert, Ox 3 , nf, deconditioned      Home meds include - amiodarone, imuran 100 qd, bumex 1 mg qd, eliquis, ensure, plaquenil 200 qd, midodrine 5 tid, prilosec, prednisone 5 qam, vericiguat, questran    UA pend    Una, Ucr pending    Renal US     BP's 110 range on admission, BP's 110- 130/ 75- 90 the last 36 hrs    2L O2 at 100% SpO2     HR 75- 85  RR 13- 18   afebrile     I/ O 1200 in and 125 cc out     IV ancef given    No IV contrast    Some po meds here - midodrine 5 tid, pred 5/d, plaquenil,+ azathioprine 100/d     CXR 11/29 - clear , no edema    Home bumex on hold here  Assessment/ Plan: AKI on CKD4 - b/l creat 2.2- 2.6 from jan - July 2023, eGFR 18- 2m/min. [facility] 3.3 on admit after fall and a L hip fx. Diuretics held, midodrine continued due to hypotension. Seen by CHF team, concerned she is nearing end stage heart failure, EF < 20%. Creat up 3.7 yest and today. BP's haven't been real low, over 1833systolic. Exam w/o any edema, CXR clear. No nephrotoxins. AKI may be due to dehydration and/or progression of  systolic CHF and/or other. Will try cautious IVF's overnight. Get UA, lytes and [place]  F/u labs in am.  L hip fx - sp ORIF on 12/01 NICM - last LVEF < 20% , also see CHF notes Atrial fib - paroxysmal Hypotension - BP's wnl here on midodrine RA - on sig immunosuppression Dysphagia - for esophagram on Monday Anemia - Hb down 10.9 ---> 7.6 today. Getting 1u prbcs.       Rob Reisa Coppola  MD 07/11/2022, 7:46 PM Recent Labs  Lab 07/07/22 1515 07/08/22 0807 07/09/22 0632 07/10/22 0809 07/11/22 0248  HGB 10.5*   < > 9.8* 8.9* 7.6*  ALBUMIN 3.2*  --  3.1*  --   --   CALCIUM 9.5   < > 9.0 9.4 9.1  PHOS  --   --  5.1*  --   --   CREATININE 3.35*   < > 3.36* 3.75* 3.78*  K 4.6   < > 4.7 5.1 5.1   < > = values in this interval not displayed.   Inpatient medications:  amiodarone  200 mg Oral Daily   azaTHIOprine  100 mg Oral Daily   cholestyramine  4 g Oral Daily   docusate sodium  100 mg Oral BID   ferrous sulfate  325 mg Oral Q breakfast   hydroxychloroquine  200 mg Oral Daily   midodrine  5 mg Oral TID WC   multivitamin with minerals  1 tablet Oral Daily   pantoprazole (PROTONIX) IV  40 mg Intravenous Q12H   polyethylene glycol  17 g Oral BID   predniSONE  5 mg Oral Q breakfast   senna-docusate  1 tablet Oral QHS   Vericiguat  10 mg Oral Daily    sodium chloride     methocarbamol (ROBAXIN) IV     acetaminophen, alum & mag hydroxide-simeth, HYDROmorphone (DILAUDID) injection, magnesium  citrate, melatonin, menthol-cetylpyridinium **OR** phenol, methocarbamol **OR** methocarbamol (ROBAXIN) IV, ondansetron **OR** ondansetron (ZOFRAN) IV, oxyCODONE, oxyCODONE, prochlorperazine, sorbitol

## 2022-07-11 NOTE — Progress Notes (Signed)
OT Cancellation Note  Patient Details Name: Tracy Nelson MRN: [id number] DOB: April 10, 1946   Cancelled Treatment:    Reason Eval/Treat Not Completed: Other (comment) Spoke with RN who is getting ready to give pt blood. Will recheck on pt later this day or next day Kari Baars, [place]  Office- [place] D 07/11/2022, 12:12 PM

## 2022-07-11 NOTE — Progress Notes (Signed)
Advanced Heart Failure Rounding Note  PCP-Cardiologist: None   Subjective:    Pt nauseated  No CP  Breathing is OK     Objective:   Weight Range: 43 kg Body mass index is 17.91 kg/m.   Vital Signs:   Temp:  [97.8 F (36.6 C)-98.4 F (36.9 C)] 98.2 F (36.8 C) (12/02 0526) Pulse Rate:  [80-93] 93 (12/01 1315) Resp:  [11-16] 13 (12/02 0526) BP: (112-130)/(70-87) 112/71 (12/02 0526) SpO2:  [91 %-100 %] 100 % (12/02 0526) Last BM Date : 07/08/22  Weight change: Filed Weights   07/08/22 1647 07/10/22 0911  Weight: 42.5 kg 43 kg    Intake/Output:   Intake/Output Summary (Last 24 hours) at 07/11/2022 1153 Last data filed at 07/10/2022 1730 Gross per 24 hour  Intake 640 ml  Output 50 ml  Net 590 ml      Physical Exam    General:  Thin. In NAD  HEENT: Normal Neck: Supple. JVP flat  . Cor: . Regular rate & rhythm. No murmurs  Lungs: Clear Abdomen: Soft, nontender, nondistended. No hepatosplenomegaly. Extremities: No edema Neuro: Alert & orientedx3, cranial nerves grossly intact. moves all 4 extremities w/o difficulty. Affect flat    Telemetry  SR   EKG    N/A  Labs    CBC Recent Labs    07/08/22 2015 07/09/22 0632 07/10/22 0809 07/11/22 0248  WBC 10.4   < > 10.4 9.2  NEUTROABS 9.1*  --   --   --   HGB 10.9*   < > 8.9* 7.6*  HCT 36.0   < > 29.3* 24.6*  MCV 95.7   < > 95.1 95.3  PLT 246   < > 244 224   < > = values in this interval not displayed.   Basic Metabolic Panel Recent Labs    07/09/22 0632 07/10/22 0809 07/11/22 0248  NA 140 142 142  K 4.7 5.1 5.1  CL 112* 116* 120*  CO2 18* 17* 16*  GLUCOSE 129* 118* 127*  BUN 65* 70* 65*  CREATININE 3.36* 3.75* 3.78*  CALCIUM 9.0 9.4 9.1  MG 2.1  --   --   PHOS 5.1*  --   --    Liver Function Tests Recent Labs    07/09/22 6712  ALBUMIN 3.1*   No results for input(s): "LIPASE", "AMYLASE" in the last 72 hours. Cardiac Enzymes No results for input(s): "CKTOTAL", "CKMB",  "CKMBINDEX", "TROPONINI" in the last 72 hours.  BNP: BNP (last 3 results) Recent Labs    11/06/21 1241 03/04/22 1629 07/07/22 1515  BNP 190.3* 193.6* 232.0*    ProBNP (last 3 results) No results for input(s): "PROBNP" in the last 8760 hours.   D-Dimer No results for input(s): "DDIMER" in the last 72 hours. Hemoglobin A1C No results for input(s): "HGBA1C" in the last 72 hours. Fasting Lipid Panel No results for input(s): "CHOL", "HDL", "LDLCALC", "TRIG", "CHOLHDL", "LDLDIRECT" in the last 72 hours. Thyroid Function Tests No results for input(s): "TSH", "T4TOTAL", "T3FREE", "THYROIDAB" in the last 72 hours.  Invalid input(s): "FREET3"   Other results:   Imaging    DG FEMUR MIN 2 VIEWS LEFT  Result Date: 07/10/2022 CLINICAL DATA:  Intramedullary nail intertrochanteric, surgery EXAM: LEFT FEMUR 2 VIEWS COMPARISON:  CT 07/08/2022 FINDINGS: Intraoperative images during left proximal femur intramedullary fixation for an intertrochanteric fracture. Improved fracture alignment. No evidence of immediate complication. IMPRESSION: Intraoperative images during left proximal femur intramedullary fixation for an intertrochanteric fracture. Improved fracture  alignment. No evidence of immediate complication. Electronically Signed   By: Maurine Simmering M.D.   On: 07/10/2022 13:02   DG C-Arm 1-60 Min-No Report  Result Date: 07/10/2022 Fluoroscopy was utilized by the requesting physician.  No radiographic interpretation.     Medications:     Scheduled Medications:  sodium chloride   Intravenous Once   acetaminophen  1,000 mg Oral Q6H   amiodarone  200 mg Oral Daily   azaTHIOprine  100 mg Oral Daily   cholestyramine  4 g Oral Daily   docusate sodium  100 mg Oral BID   ferrous sulfate  325 mg Oral Q breakfast   hydroxychloroquine  200 mg Oral Daily   midodrine  5 mg Oral TID WC   multivitamin with minerals  1 tablet Oral Daily   pantoprazole (PROTONIX) IV  40 mg Intravenous Q12H    predniSONE  5 mg Oral Q breakfast   senna-docusate  1 tablet Oral QHS   Vericiguat  10 mg Oral Daily    Infusions:  sodium chloride     methocarbamol (ROBAXIN) IV      PRN Medications: acetaminophen, alum & mag hydroxide-simeth, HYDROmorphone (DILAUDID) injection, magnesium citrate, melatonin, menthol-cetylpyridinium **OR** phenol, methocarbamol **OR** methocarbamol (ROBAXIN) IV, ondansetron **OR** ondansetron (ZOFRAN) IV, oxyCODONE, oxyCODONE, polyethylene glycol, polyethylene glycol, prochlorperazine, sorbitol    Patient Profile   76 y/o female w/ 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 Ortho has seen, planning possible IMN tomorrow. AHF team asked to see for CV risk stratification/ surgical clearance.   Assessment/Plan   1.  Chronic systolic CHF: Nonischemic cardiomyopathy.  This has been known since 2015, cath in 2015 showed no coronary disease PAP scans , last in 2023 negative Genetic eval negative Cardiac MRI in 10/22 showed mild LV dilation with EF 23%, normal RV size with RVEF 37%, ECV 34%, small area of subendocardial LGE in the mid inferolateral wall and small area of full thickness LGE in the basal inferior wall.   This was not suggestive of cardiac amyloidosis but possibly could be consistent with coronary embolism (though no LV thrombus visualized) versus prior myocarditis versus sarcoidosis.  GDMT limited by renal function  and hypotension.  - Continue midodrine 5 mg tid.  - Hold diuretics.  - Continue Verquvo 10 mg daily.  - Off dapagliflozin with rise in creatinine.   - Off spironolactone with elevated creatinine and soft BP.   - Though RHC in 1/22 was not markedly abnormal, CPX in 3/22 showed severe functional limitation due to HF and BP also runs quite low.  Concerned she is nearing end-stage. Pt not  interested in  barostimulation activation therapy, - Does not have an ICD, would not place given NICM, advanced age, and concern for nearing end stage HF.  - Continue midodrine 5 mg tid.  - Hold diuretics.  - Continue Verquvo 10 mg daily.  - Off dapagliflozin with rise in creatinine.   - Off spironolactone with elevated creatinine and soft BP.    Volume status appear ok today  About to get blood Echo today   Will review   2. Atrial fibrillation/flutter: S/p TEE-DCCV in 4/22.   - SR on tele  Continue   - Continue amiodarone 200 mg daily.  -Off Eliquis  Getting transfused     3. CKD: Stage 4.  She is followed by Dr. Royce Macadamia. -  SCr 3.78       4  Anemia  Getting transfused today    5 Rheumatoid arthritis: Continue immunosuppressives   Dorris Carnes, MD  07/11/2022, 11:53 AM

## 2022-07-11 NOTE — Evaluation (Signed)
Physical Therapy Evaluation Patient Details Name: Tracy Nelson MRN: [id number] DOB: 1946/05/26 Today's Date: 07/11/2022  History of Present Illness  Pt is a 76 y.o. female admitted s/p fall and left intertrochanteric fracture. Underwent IM nailing on 07/10/22.  PMH significant for HFrEF<20%, nonischemic cardiomyopathy, rheumatoid arthritis on immunosuppressants, GERD, CKD IV.,  Clinical Impression  Patient presents with significant dependency in gait and mobility.  Main limiting factor today is pain.  Patient was independent caring for herself prior to admission and lives with family (although they are gone during the day).  I am hopeful that as her pain is controlled, [name] will be more mobile and be able to return home after a course of inpatient post-acute rehab.  Recommending AIR and anticipate with progress will only need intermittent care.  Will benefit from continued PT to progress mobility and independence.         Recommendations for follow up therapy are one component of a multi-disciplinary discharge planning process, led by the attending physician.  Recommendations may be updated based on patient status, additional functional criteria and insurance authorization.  Follow Up Recommendations Acute inpatient rehab (3hours/day)      Assistance Recommended at Discharge Frequent or constant Supervision/Assistance  Patient can return home with the following  A lot of help with walking and/or transfers;A lot of help with bathing/dressing/bathroom;Assistance with cooking/housework;Help with stairs or ramp for entrance    Equipment Recommendations Rolling walker (2 wheels);BSC/3in1  Recommendations for Other Services  Rehab consult    Functional Status Assessment Patient has had a recent decline in their functional status and demonstrates the ability to make significant improvements in function in a reasonable and predictable amount of time.     Precautions / Restrictions  Precautions Precautions: Fall      Mobility  Bed Mobility Overal bed mobility: Needs Assistance Bed Mobility: Rolling, Supine to Sit, Sit to Supine Rolling: Mod assist   Supine to sit: Max assist Sit to supine: Max assist   General bed mobility comments: verbal and tactile cues for sequencing to scoot to edge of bed and come to sitting    Transfers                   General transfer comment: pt declined OOB due to pain, nausea    Ambulation/Gait                  Stairs            Wheelchair Mobility    Modified Rankin (Stroke Patients Only)       Balance Overall balance assessment: Needs assistance Sitting-balance support: No upper extremity supported, Feet supported Sitting balance-Leahy Scale: Fair Sitting balance - Comments: able to sit EOB x 5 min with supervision                                     Pertinent Vitals/Pain Pain Assessment Pain Assessment: 0-10 Pain Score: 8  Pain Location: left hip Pain Descriptors / Indicators: Discomfort, Aching, Grimacing, Guarding Pain Intervention(s): Limited activity within patient's tolerance, Monitored during session    Home Living Family/patient expects to be discharged to:: Private residence Living Arrangements: Children Available Help at Discharge: Family;Available PRN/intermittently Type of Home: House Home Access: Level entry       Home Layout: Two level;Able to live on main level with bedroom/bathroom   Additional Comments: Lives with daughter and her family -  she is typically alone during the day.    Prior Function Prior Level of Function : Independent/Modified Independent                     Hand Dominance   Dominant Hand: Right    Extremity/Trunk Assessment        Lower Extremity Assessment Lower Extremity Assessment: LLE deficits/detail LLE: Unable to fully assess due to pain    Cervical / Trunk Assessment Cervical / Trunk Assessment: Normal   Communication   Communication: No difficulties  Cognition Arousal/Alertness: Awake/alert Behavior During Therapy: WFL for tasks assessed/performed Overall Cognitive Status: Within Functional Limits for tasks assessed                                          General Comments      Exercises     Assessment/Plan    PT Assessment Patient needs continued PT services  PT Problem List Decreased strength;Decreased balance;Decreased mobility;Decreased activity tolerance;Decreased knowledge of use of DME;Pain       PT Treatment Interventions DME instruction;Gait training;Functional mobility training;Therapeutic activities;Therapeutic exercise;Balance training;Patient/family education    PT Goals (Current goals can be found in the Care Plan section)  Acute Rehab PT Goals Patient Stated Goal: none stated PT Goal Formulation: With patient Time For Goal Achievement: 07/25/22 Potential to Achieve Goals: Good    Frequency Min 4X/week     Co-evaluation               AM-PAC PT "6 Clicks" Mobility  Outcome Measure Help needed turning from your back to your side while in a flat bed without using bedrails?: A Lot Help needed moving from lying on your back to sitting on the side of a flat bed without using bedrails?: A Lot Help needed moving to and from a bed to a chair (including a wheelchair)?: Total Help needed standing up from a chair using your arms (e.g., wheelchair or bedside chair)?: Total Help needed to walk in hospital room?: Total Help needed climbing 3-5 steps with a railing? : Total 6 Click Score: 8    End of Session   Activity Tolerance: Patient limited by pain Patient left: in bed;with call bell/phone within reach   PT Visit Diagnosis: Unsteadiness on feet (R26.81);Other abnormalities of gait and mobility (R26.89);History of falling (Z91.81);Difficulty in walking, not elsewhere classified (R26.2)    Time: 1448-1856 PT Time Calculation (min)  (ACUTE ONLY): 27 min   Charges:   PT Evaluation $PT Eval Moderate Complexity: 1 Mod PT Treatments $Therapeutic Activity: 8-22 mins        07/11/2022 Tracy Nelson, PT Acute Rehabilitation Services Office:  [phone]   Tracy Nelson 07/11/2022, 3:27 PM

## 2022-07-11 NOTE — Progress Notes (Signed)
PROGRESS NOTE    Tracy Nelson  EPP:[id number] DOB: 02/07/1946 DOA: 07/08/2022 PCP: [name], [name]   Brief Narrative: 76 year old with past medical history significant for systolic heart failure ejection fraction less than 20%, nonischemic cardiomyopathy, rheumatoid arthritis on immunosuppressants, GERD, CKD stage IV,MGUS, presents to the ED for after she had a mechanical fall, she landed on her left hip.  She was found to have a left intertrochanteric fracture.  Orthopedic Dr. Erlinda Hong was consulted plan is for surgery on Friday after Eliquis washout.    Assessment & Plan:   Principal Problem:   Closed left hip fracture, initial encounter ([place]) Active Problems:   Non-ischemic cardiomyopathy (HCC)   HFrEF (heart failure with reduced ejection fraction) (HCC)   HTN (hypertension)   Atrial flutter (HCC)   CKD (chronic kidney disease), stage IV ([place])  1-Closed Left Hip Fracture:  Orthopedic consulted, cardiology consulted for pre op evaluation. Patient is high risk for procedure.  -Underwent treatment of intertrochanteric fracture with intramedullary implant with Dr. Erlinda Hong 12/1. -PT per Ortho.  -Bowel regimen.  -Resume eliquis , once hb above 8 per ortho.    2-CKD stage IV, acute on chronic.  Metabolic acidosis.  Cr baseline 2.4  Cr Increase to 3.7. continue to hold Diuretics.  Monitor.  Encourage oral intake.  Nephrology consulted.   3-Heart failure with reduced ejection fraction, ejection fraction less than 20%, nonischemic cardiomyopathy: Follow-up with Dr. Trey Paula Cardiology consulted for preop assessment, patient felt to be high risk for procedure Hold Bumetadine.  Continue with Verquvo.   4-Paroxysmal A-fib: Resume Eliquis once hb above 8.   5-Rheumatoid arthritis: Present immunosuppressant medication.  6-GERD: Continue with PPI  7-Hypertension: Continue with Midodrine.   8-Dysphagia; Report difficulty swallowing solid and liquids for months. Plan to start IV PPI  BID. Esophagogram on Monday   Acute blood loss anemia;  Plan to transfuse one unit.   Estimated body mass index is 17.91 kg/m as calculated from the following:   Height as of this encounter: '5\' 1"'$  (1.549 m).   Weight as of this encounter: 43 kg.   DVT prophylaxis: SCD Code Status: Full code Family Communication: Daughter at bedside.  Disposition Plan:  Status is: Inpatient Remains inpatient appropriate because: management of hip fracture.     Consultants:  Cardiology Dr Erlinda Hong with Ortho  Procedures:    Antimicrobials:    Subjective: She is not feeling well, report nausea.  Denies chest pain or dyspnea. Feels weak.   Objective: Vitals:   07/10/22 1315 07/10/22 2200 07/11/22 0230 07/11/22 0526  BP: 130/85 112/70 114/73 112/71  Pulse: 93     Resp: '16 13 14 13  '$ Temp: 97.8 F (36.6 C) 98.4 F (36.9 C) 97.8 F (36.6 C) 98.2 F (36.8 C)  TempSrc:  Oral Oral Oral  SpO2: 91% 100% 100% 100%  Weight:      Height:        Intake/Output Summary (Last 24 hours) at 07/11/2022 1036 Last data filed at 07/10/2022 1730 Gross per 24 hour  Intake 640 ml  Output 50 ml  Net 590 ml   Filed Weights   07/08/22 1647 07/10/22 0911  Weight: 42.5 kg 43 kg    Examination:  General exam: Chronic ill appearing. NAD Respiratory system: NAD Cardiovascular system: S 1, S 2 RRR Gastrointestinal system: BS present, soft, nt Central nervous system:  alert, follows command Extremities: LE with dressing at surgical site, hip    Data Reviewed: I have personally reviewed following labs and  imaging studies  CBC: Recent Labs  Lab 07/07/22 1515 07/08/22 0807 07/08/22 2015 07/09/22 0632 07/10/22 0809 07/11/22 0248  WBC 11.3*  --  10.4 11.0* 10.4 9.2  NEUTROABS  --   --  9.1*  --   --   --   HGB 10.5* 10.7* 10.9* 9.8* 8.9* 7.6*  HCT 33.6*  --  36.0 31.2* 29.3* 24.6*  MCV 94.4  --  95.7 94.0 95.1 95.3  PLT 277  --  246 228 244 315    Basic Metabolic Panel: Recent Labs  Lab  07/07/22 1515 07/08/22 2015 07/09/22 0632 07/10/22 0809 07/11/22 0248  NA 139 141 140 142 142  K 4.6 4.5 4.7 5.1 5.1  CL 109 108 112* 116* 120*  CO2 18* 20* 18* 17* 16*  GLUCOSE 104* 119* 129* 118* 127*  BUN 65* 69* 65* 70* 65*  CREATININE 3.35* 3.33* 3.36* 3.75* 3.78*  CALCIUM 9.5 9.6 9.0 9.4 9.1  MG  --   --  2.1  --   --   PHOS  --   --  5.1*  --   --     GFR: Estimated Creatinine Clearance: 8.6 mL/min (A) (by C-G formula based on SCr of 3.78 mg/dL (H)). Liver Function Tests: Recent Labs  Lab 07/07/22 1515 07/09/22 0632  AST 30  --   ALT 32  --   ALKPHOS 56  --   BILITOT 0.6  --   PROT 6.3*  --   ALBUMIN 3.2* 3.1*    No results for input(s): "LIPASE", "AMYLASE" in the last 168 hours. No results for input(s): "AMMONIA" in the last 168 hours. Coagulation Profile: No results for input(s): "INR", "PROTIME" in the last 168 hours. Cardiac Enzymes: No results for input(s): "CKTOTAL", "CKMB", "CKMBINDEX", "TROPONINI" in the last 168 hours. BNP (last 3 results) No results for input(s): "PROBNP" in the last 8760 hours. HbA1C: No results for input(s): "HGBA1C" in the last 72 hours. CBG: Recent Labs  Lab 07/08/22 2357 07/10/22 2041  GLUCAP 173* 144*    Lipid Profile: No results for input(s): "CHOL", "HDL", "LDLCALC", "TRIG", "CHOLHDL", "LDLDIRECT" in the last 72 hours. Thyroid Function Tests: No results for input(s): "TSH", "T4TOTAL", "FREET4", "T3FREE", "THYROIDAB" in the last 72 hours.  Anemia Panel: No results for input(s): "VITAMINB12", "FOLATE", "FERRITIN", "TIBC", "IRON", "RETICCTPCT" in the last 72 hours. Sepsis Labs: No results for input(s): "PROCALCITON", "LATICACIDVEN" in the last 168 hours.  Recent Results (from the past 240 hour(s))  Surgical pcr screen     Status: None   Collection Time: 07/10/22  5:28 AM   Specimen: Nasal Mucosa; Nasal Swab  Result Value Ref Range Status   MRSA, PCR NEGATIVE NEGATIVE Final   Staphylococcus aureus NEGATIVE  NEGATIVE Final    Comment: (NOTE) The Xpert SA Assay (FDA approved for NASAL specimens in patients 55 years of age and older), is one component of a comprehensive surveillance program. It is not intended to diagnose infection nor to guide or monitor treatment. Performed at [facility], [place] [address] [place], [place] [address]          Radiology Studies: DG FEMUR MIN 2 VIEWS LEFT  Result Date: 07/10/2022 CLINICAL DATA:  Intramedullary nail intertrochanteric, surgery EXAM: LEFT FEMUR 2 VIEWS COMPARISON:  CT 07/08/2022 FINDINGS: Intraoperative images during left proximal femur intramedullary fixation for an intertrochanteric fracture. Improved fracture alignment. No evidence of immediate complication. IMPRESSION: Intraoperative images during left proximal femur intramedullary fixation for an intertrochanteric fracture. Improved fracture alignment.  No evidence of immediate complication. Electronically Signed   By: Maurine Simmering M.D.   On: 07/10/2022 13:02   DG C-Arm 1-60 Min-No Report  Result Date: 07/10/2022 Fluoroscopy was utilized by the requesting physician.  No radiographic interpretation.        Scheduled Meds:  acetaminophen  1,000 mg Oral Q6H   amiodarone  200 mg Oral Daily   azaTHIOprine  100 mg Oral Daily   cholestyramine  4 g Oral Daily   docusate sodium  100 mg Oral BID   ferrous sulfate  325 mg Oral Q breakfast   hydroxychloroquine  200 mg Oral Daily   midodrine  5 mg Oral TID WC   multivitamin with minerals  1 tablet Oral Daily   pantoprazole (PROTONIX) IV  40 mg Intravenous Q12H   predniSONE  5 mg Oral Q breakfast   senna-docusate  1 tablet Oral QHS   Vericiguat  10 mg Oral Daily   Continuous Infusions:  sodium chloride     methocarbamol (ROBAXIN) IV       LOS: 3 days    Time spent: 35 minutes    Sanora Cunanan A Demtrius Rounds, MD [facility]   If 7PM-7AM, please contact night-coverage [email or web address]  07/11/2022, 10:36 AM

## 2022-07-11 NOTE — Plan of Care (Signed)

## 2022-07-11 NOTE — Progress Notes (Signed)
Echocardiogram 2D Echocardiogram has been performed.  Oneal Deputy Alecsander Hattabaugh RDCS 07/11/2022, 8:38 AM

## 2022-07-11 NOTE — Progress Notes (Signed)
Inpatient Rehab Admissions Coordinator Note:   Per PT patient was screened for CIR candidacy by Annasophia Crocker Danford Bad, CCC-SLP. At this time, pt has not yet attempted transfers. Pt may have potential to progress to becoming a potential CIR candidate. CIR admissions team will follow to monitor for progress and participation with therapies. A consult order will be placed if pt appears to be an appropriate candidate.    Gayland Curry, [place], [place] Admissions Coordinator [phone] 07/11/22 5:38 PM

## 2022-07-12 ENCOUNTER — Inpatient Hospital Stay (HOSPITAL_COMMUNITY): Payer: Medicare PPO

## 2022-07-12 DIAGNOSIS — S72002A Fracture of unspecified part of neck of left femur, initial encounter for closed fracture: Secondary | ICD-10-CM | POA: Diagnosis not present

## 2022-07-12 DIAGNOSIS — I5022 Chronic systolic (congestive) heart failure: Secondary | ICD-10-CM | POA: Diagnosis not present

## 2022-07-12 LAB — BPAM RBC
Blood Product Expiration Date: 202312212359
ISSUE DATE / TIME: 202312021144
Unit Type and Rh: 5100

## 2022-07-12 LAB — CBC
HCT: 28.6 % — ABNORMAL LOW (ref 36.0–46.0)
Hemoglobin: 9.3 g/dL — ABNORMAL LOW (ref 12.0–15.0)
MCH: 29.6 pg (ref 26.0–34.0)
MCHC: 32.5 g/dL (ref 30.0–36.0)
MCV: 91.1 fL (ref 80.0–100.0)
Platelets: 228 10*3/uL (ref 150–400)
RBC: 3.14 MIL/uL — ABNORMAL LOW (ref 3.87–5.11)
RDW: 17.9 % — ABNORMAL HIGH (ref 11.5–15.5)
WBC: 8.7 10*3/uL (ref 4.0–10.5)
nRBC: 0.2 % (ref 0.0–0.2)

## 2022-07-12 LAB — BASIC METABOLIC PANEL
Anion gap: 10 (ref 5–15)
BUN: 66 mg/dL — ABNORMAL HIGH (ref 8–23)
CO2: 16 mmol/L — ABNORMAL LOW (ref 22–32)
Calcium: 9.2 mg/dL (ref 8.9–10.3)
Chloride: 118 mmol/L — ABNORMAL HIGH (ref 98–111)
Creatinine, Ser: 3.67 mg/dL — ABNORMAL HIGH (ref 0.44–1.00)
GFR, Estimated: 12 mL/min — ABNORMAL LOW (ref >60)
Glucose, Bld: 90 mg/dL (ref 70–99)
Potassium: 5 mmol/L (ref 3.5–5.1)
Sodium: 144 mmol/L (ref 135–145)

## 2022-07-12 LAB — TYPE AND SCREEN
ABO/RH(D): O POS
Antibody Screen: NEGATIVE
Unit division: 0

## 2022-07-12 MED ORDER — APIXABAN 2.5 MG PO TABS
2.5000 mg | ORAL_TABLET | Freq: Two times a day (BID) | ORAL | Status: DC
  Administered 2022-07-12 – 2022-07-15 (×7): 2.5 mg via ORAL
  Filled 2022-07-12 (×7): qty 1

## 2022-07-12 NOTE — Progress Notes (Signed)
Physical Therapy Treatment Patient Details Name: Tracy Nelson MRN: [id number] DOB: 09-18-1945 Today's Date: 07/12/2022   History of Present Illness Pt is a 76 y.o. female admitted s/p fall and left intertrochanteric fracture. Underwent IM nailing on 07/10/22.  PMH significant for HFrEF<20%, nonischemic cardiomyopathy, rheumatoid arthritis on immunosuppressants, GERD, CKD IV.,    PT Comments    Continuing work on functional mobility and activity tolerance;  Session focused on bed mobiltiy and functional transfers, with notable progress with each; Able to stand to RW and take pivotal steps bed to recliner on her R with RW and moderate assist of 2; I anticipate progress with continue; Continue to recommend acute inpatient rehab (AIR) for post-acute therapy needs.   Recommendations for follow up therapy are one component of a multi-disciplinary discharge planning process, led by the attending physician.  Recommendations may be updated based on patient status, additional functional criteria and insurance authorization.  Follow Up Recommendations  Acute inpatient rehab (3hours/day)     Assistance Recommended at Discharge Frequent or constant Supervision/Assistance  Patient can return home with the following A lot of help with walking and/or transfers;A lot of help with bathing/dressing/bathroom;Assistance with cooking/housework;Help with stairs or ramp for entrance   Equipment Recommendations  Rolling walker (2 wheels);BSC/3in1    Recommendations for Other Services Rehab consult     Precautions / Restrictions Precautions Precautions: Fall Restrictions Weight Bearing Restrictions: No LLE Weight Bearing: Weight bearing as tolerated     Mobility  Bed Mobility Overal bed mobility: Needs Assistance Bed Mobility: Supine to Sit     Supine to sit: +2 for safety/equipment, Max assist     General bed mobility comments: Cues for technique; +2 Max assist to help to sit up to EOB with  enough support for pt to tolerate the movemetn    Transfers Overall transfer level: Needs assistance Equipment used: Rolling walker (2 wheels) Transfers: Sit to/from Stand, Bed to chair/wheelchair/BSC Sit to Stand: +2 physical assistance, +2 safety/equipment, Mod assist   Step pivot transfers: Min assist, +2 physical assistance, +2 safety/equipment       General transfer comment: Cues for hand placement and safety; heavy dependence on UEs to push up and support bodyweight on RW; able to take short, pivotal steps bed to chair with UE support on RW    Ambulation/Gait                   Stairs             Wheelchair Mobility    Modified Rankin (Stroke Patients Only)       Balance     Sitting balance-Leahy Scale: Fair       Standing balance-Leahy Scale: Poor                              Cognition Arousal/Alertness: Awake/alert Behavior During Therapy: WFL for tasks assessed/performed Overall Cognitive Status: Within Functional Limits for tasks assessed                                          Exercises      General Comments        Pertinent Vitals/Pain Pain Assessment Pain Assessment: Faces Faces Pain Scale: Hurts even more Pain Location: left hip Pain Descriptors / Indicators: Discomfort, Aching, Grimacing, Guarding Pain Intervention(s): Monitored during session  Home Living Family/patient expects to be discharged to:: Private residence Living Arrangements: Children Available Help at Discharge: Family;Available PRN/intermittently Type of Home: House Home Access: Level entry           Additional Comments: Lives with daughter and her family - she is typically alone during the day.    Prior Function            PT Goals (current goals can now be found in the care plan section) Acute Rehab PT Goals Patient Stated Goal: Agrred to try and get OOB PT Goal Formulation: With patient Time For Goal  Achievement: 07/25/22 Potential to Achieve Goals: Good Progress towards PT goals: Progressing toward goals    Frequency    Min 4X/week      PT Plan Current plan remains appropriate    Co-evaluation PT/OT/SLP Co-Evaluation/Treatment: Yes Reason for Co-Treatment: For patient/therapist safety;To address functional/ADL transfers PT goals addressed during session: Mobility/safety with mobility;Balance;Proper use of DME;Strengthening/ROM OT goals addressed during session: Strengthening/ROM;ADL's and self-care      AM-PAC PT "6 Clicks" Mobility   Outcome Measure  Help needed turning from your back to your side while in a flat bed without using bedrails?: A Lot Help needed moving from lying on your back to sitting on the side of a flat bed without using bedrails?: Total Help needed moving to and from a bed to a chair (including a wheelchair)?: Total Help needed standing up from a chair using your arms (e.g., wheelchair or bedside chair)?: Total Help needed to walk in hospital room?: Total Help needed climbing 3-5 steps with a railing? : Total 6 Click Score: 7    End of Session Equipment Utilized During Treatment: Gait belt Activity Tolerance: Patient tolerated treatment well Patient left: in chair;with call bell/phone within reach;with chair alarm set Nurse Communication: Mobility status PT Visit Diagnosis: Unsteadiness on feet (R26.81);Other abnormalities of gait and mobility (R26.89);History of falling (Z91.81);Difficulty in walking, not elsewhere classified (R26.2)     Time: 1113-1140 PT Time Calculation (min) (ACUTE ONLY): 27 min  Charges:  $Therapeutic Activity: 8-22 mins                     Roney Marion, PT  Acute Rehabilitation Services Office [phone]    Colletta Maryland 07/12/2022, 12:18 PM

## 2022-07-12 NOTE — Progress Notes (Signed)
[facility] Progress Note  Subjective: pt stable, BP's good, UOP 340 yest and 720 today. ECHO showed better LVEF around 40-45%.   Vitals:   07/11/22 1500 07/11/22 2200 07/12/22 0700 07/12/22 0809  BP: (!) 149/81 120/73    Pulse:  77    Resp: _0 Temp:  98.2 F (36.8 C)  98.4 F (36.9 C)  TempSrc: Oral Oral  Oral  SpO2: 100% 99%    Weight:   45.8 kg   Height:        Exam: Gen alert, frail elderly, pleasant BP's good , on RA No jvd or bruits Chest clear bilat to bases RRR no MRG Abd soft ntnd no mass or ascites +bs Ext no LE  edema Neuro is alert, Ox 3 , nf, deconditioned       Home meds include - amiodarone, imuran 100 qd, bumex 1 mg qd, eliquis, ensure, plaquenil 200 qd, midodrine 5 tid, prilosec, prednisone 5 qam, vericiguat, questran     UA pend    Una, Ucr pending    Renal US - 9.5/ 9.2 cm kidneys, mild fullness of the L collecting system.      CXR 11/29 - clear , no edema    Assessment/ Plan: AKI on CKD4 - b/l creat 2.2- 2.6 from jan - July 2023, eGFR 18- 12m/min. [facility] after a fall w/ L hip fx. Diuretics held and home midodrine continued due to hypotension. Pt went to OR 12/01 for ORIF of hip fx. Creat bumped to 3.7 yesterday. UA and urine lytes pending still. Renal UKoreaw/o sig obstruction. No hypotension, contrast, nephrotoxins. Exam showed no edema, CXR clear. EF better by echo at 40-45%. Suspected AKI due to dehydration. Home bumex on hold. IVF"s started and creat stable today w/ improved UOP. Cont IVF's.  L hip fx - sp ORIF on 12/01 NICM - prior LVEF < 20%, now is around 45-50%. Improved.  Atrial fib - paroxysmal Chronic hypotension - BP's wnl here RA - on sig immunosuppression Dysphagia - for esophagram on Monday Anemia - Hb dropped, sp 1u prbc's. Hb 9.3 now.        Rob Tarell Schollmeyer 07/12/2022, 11:05 AM   Recent Labs  Lab 07/07/22 1515 07/08/22 0807 07/09/22 0632 07/10/22 0809 07/11/22 0248  07/12/22 0200  HGB 10.5*   < > 9.8*   < > 7.6* 9.3*  ALBUMIN 3.2*  --  3.1*  --   --   --   CALCIUM 9.5   < > 9.0   < > 9.1 9.2  PHOS  --   --  5.1*  --   --   --   CREATININE 3.35*   < > 3.36*   < > 3.78* 3.67*  K 4.6   < > 4.7   < > 5.1 5.0   < > = values in this interval not displayed.   No results for input(s): "IRON", "TIBC", "FERRITIN" in the last 168 hours. Inpatient medications:  amiodarone  200 mg Oral Daily   apixaban  2.5 mg Oral BID   azaTHIOprine  100 mg Oral Daily   cholestyramine  4 g Oral Daily   docusate sodium  100 mg Oral BID   ferrous sulfate  325 mg Oral Q breakfast   hydroxychloroquine  200 mg Oral Daily   midodrine  5 mg Oral TID WC   multivitamin with minerals  1 tablet Oral Daily   pantoprazole (  PROTONIX) IV  40 mg Intravenous Q12H   polyethylene glycol  17 g Oral BID   predniSONE  5 mg Oral Q breakfast   senna-docusate  1 tablet Oral QHS   Vericiguat  10 mg Oral Daily    sodium chloride     sodium chloride Stopped (07/12/22 0830)   methocarbamol (ROBAXIN) IV     acetaminophen, alum & mag hydroxide-simeth, HYDROmorphone (DILAUDID) injection, magnesium citrate, melatonin, menthol-cetylpyridinium **OR** phenol, methocarbamol **OR** methocarbamol (ROBAXIN) IV, ondansetron **OR** ondansetron (ZOFRAN) IV, oxyCODONE, oxyCODONE, prochlorperazine, sorbitol

## 2022-07-12 NOTE — Plan of Care (Signed)

## 2022-07-12 NOTE — Evaluation (Signed)
Occupational Therapy Evaluation Patient Details Name: Tracy Nelson MRN: [id number] DOB: October 30, 1945 Today's Date: 07/12/2022   History of Present Illness Pt is a 76 y.o. female admitted s/p fall and left intertrochanteric fracture. Underwent IM nailing on 07/10/22.  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 Currently she is setup/S-total for basic ADLs and Min A +2- Max A +2 for bed mobility, sit<>stand, and step pivot transfers. She will continue to benefit from acute OT with follow up on AIR.      Recommendations for follow up therapy are one component of a multi-disciplinary discharge planning process, led by the attending physician.  Recommendations may be updated based on patient status, additional functional criteria and insurance authorization.   Follow Up Recommendations  Skilled nursing-short term rehab (<3 hours/day)     Assistance Recommended at Discharge Frequent or constant Supervision/Assistance  Patient can return home with the following Two people to help with walking and/or transfers;A lot of help with bathing/dressing/bathroom;Assistance with cooking/housework;Help with stairs or ramp for entrance;Assist for transportation    Functional Status Assessment  Patient has had a recent decline in their functional status and demonstrates the ability to make significant improvements in function in a reasonable and predictable amount of time.  Equipment Recommendations  Other (comment) (TBD next venue)       Precautions / Restrictions Precautions Precautions: Fall Restrictions Weight Bearing Restrictions: No LLE Weight Bearing: Weight bearing as tolerated      Mobility Bed Mobility Overal bed mobility: Needs Assistance Bed Mobility: Supine to Sit      Supine to sit: Max assist, +2 for physical assistance     General bed mobility comments: verbal and tactile cues for sequencing to scoot to edge of bed and come to sitting    Transfers Overall transfer level: Needs assistance Equipment used: Rolling walker (2 wheels) Transfers: Sit to/from Stand, Bed to chair/wheelchair/BSC Sit to Stand: Min assist, +2 physical assistance     Step pivot transfers: Mod assist, +2 physical assistance            Balance Overall balance assessment: Needs assistance Sitting-balance support: No upper extremity supported, Feet supported Sitting balance-Leahy Scale: Fair     Standing balance support: Bilateral upper extremity supported, Reliant on assistive device for balance Standing balance-Leahy Scale: Poor Standing balance comment: AD and A from therapists                           ADL either performed or assessed with clinical judgement   ADL Overall ADL's : Needs assistance/impaired Eating/Feeding: Independent;Sitting   Grooming: Set up;Sitting   Upper Body Bathing: Set up;Sitting   Lower Body Bathing: Maximal assistance Lower Body Bathing Details (indicate cue type and reason): min A +2 sit<>stand Upper Body Dressing : Set up;Sitting   Lower Body Dressing: Total assistance Lower Body Dressing Details (indicate cue type and reason): min A +2 sit<>stand Toilet Transfer: Moderate assistance;+2 for physical assistance;Stand-pivot;Rolling walker (2 wheels) Toilet Transfer Details (indicate cue type and reason): simulated bed>recliner on pt's right Toileting- Clothing Manipulation and Hygiene: Total assistance Toileting - Clothing Manipulation Details (indicate cue type and reason): min A +2 sit<>stand             Vision Patient Visual Report: No change from baseline  Pertinent Vitals/Pain Pain Assessment Pain Assessment: Faces Faces Pain Scale: Hurts a little bit Pain Location: left hip Pain  Descriptors / Indicators: Aching, Sore Pain Intervention(s): Limited activity within patient's tolerance, Monitored during session, Repositioned, Premedicated before session     Hand Dominance Right   Extremity/Trunk Assessment Upper Extremity Assessment Upper Extremity Assessment: Generalized weakness ("my arms are getting tired" using to step pivot from bed>recliner with alot of weight through UEs)           Communication Communication Communication: No difficulties   Cognition Arousal/Alertness: Awake/alert Behavior During Therapy: WFL for tasks assessed/performed                                                    Home Living Family/patient expects to be discharged to:: Private residence Living Arrangements: Children Available Help at Discharge: Family;Available PRN/intermittently Type of Home: House Home Access: Level entry           Bathroom Shower/Tub: Tub/shower unit   Bathroom Toilet: Handicapped height         Additional Comments: Lives with daughter and her family - she is typically alone during the day.      Prior Functioning/Environment Prior Level of Function : Independent/Modified Independent                        OT Problem List: Decreased strength;Decreased range of motion;Impaired balance (sitting and/or standing);Decreased knowledge of use of DME or AE;Pain      OT Treatment/Interventions: Self-care/ADL training;Patient/family education;DME and/or AE instruction;Balance training    OT Goals(Current goals can be found in the care plan section) Acute Rehab OT Goals Patient Stated Goal: for pain to be less OT Goal Formulation: With patient Time For Goal Achievement: 07/26/22 Potential to Achieve Goals: Good  OT Frequency: Min 2X/week    Co-evaluation PT/OT/SLP Co-Evaluation/Treatment: Yes Reason for Co-Treatment: For patient/therapist safety;To address functional/ADL transfers PT goals addressed during  session: Mobility/safety with mobility;Balance;Proper use of DME;Strengthening/ROM OT goals addressed during session: Strengthening/ROM;ADL's and self-care      AM-PAC OT "6 Clicks" Daily Activity     Outcome Measure Help from another person eating meals?: None Help from another person taking care of personal grooming?: A Little Help from another person toileting, which includes using toliet, bedpan, or urinal?: Total Help from another person bathing (including washing, rinsing, drying)?: A Lot Help from another person to put on and taking off regular upper body clothing?: A Little Help from another person to put on and taking off regular lower body clothing?: Total 6 Click Score: 14   End of Session Equipment Utilized During Treatment: Gait belt;Rolling walker (2 wheels) Nurse Communication: Mobility status  Activity Tolerance: Patient tolerated treatment well Patient left: in chair;with call bell/phone within reach;with chair alarm set  OT Visit Diagnosis: Unsteadiness on feet (R26.81);Other abnormalities of gait and mobility (R26.89);Muscle weakness (generalized) (M62.81);Pain Pain - Right/Left: Left Pain - part of body: Leg                Time: 1115-1145 OT Time Calculation (min): 30 min Charges:  OT General Charges $OT Visit: 1 Visit OT Evaluation $OT Eval Moderate Complexity: [place], OTR/L Acute [employer] [facility] [phone] Office [phone]    Almon Register 07/12/2022, 12:16 PM

## 2022-07-12 NOTE — Progress Notes (Signed)
Transition of Care [facility]) - CAGE-AID Screening   Patient Details  Name: Tracy Nelson MRN: [id number] Date of Birth: 1946-06-27   Clinical Narrative:  Tracy Nelson is a 76 y.o. 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  The patient tripped and landed on her left hip.  Was in her usual state of health prior to this.  Last dose of home Eliquis was taken after 3 PM.  After her fall, she took all her home medications.   In the ED, imaging revealed left intertrochanteric fracture, no other injuries.  EDP discussed the case with orthopedic surgery.  Plan for orthopedic surgery.  Pt reports to concern or issue related to drugs or alcohol.   CAGE-AID Screening:    Have You Ever Felt You Ought to Cut Down on Your Drinking or Drug Use?: No Have People Annoyed You By Critizing Your Drinking Or Drug Use?: No Have You Felt Bad Or Guilty About Your Drinking Or Drug Use?: No Have You Ever Had a Drink or Used Drugs First Thing In The Morning to Steady Your Nerves or to Get Rid of a Hangover?: No CAGE-AID Score: 0

## 2022-07-12 NOTE — Progress Notes (Signed)
PROGRESS NOTE    Tracy Nelson  DGU:[id number] DOB: September 09, 1945 DOA: 07/08/2022 PCP: [name], [name]   Brief Narrative: 76 year old with past medical history significant for systolic heart failure ejection fraction less than 20%, nonischemic cardiomyopathy, rheumatoid arthritis on immunosuppressants, GERD, CKD stage IV,MGUS, presents to the ED for after she had a mechanical fall, she landed on her left hip.  She was found to have a left intertrochanteric fracture.  Orthopedic Dr. Erlinda Hong was consulted plan is for surgery on Friday after Eliquis washout.    Assessment & Plan:   Principal Problem:   Closed left hip fracture, initial encounter ([place]) Active Problems:   Non-ischemic cardiomyopathy (HCC)   HFrEF (heart failure with reduced ejection fraction) (HCC)   HTN (hypertension)   Atrial flutter (HCC)   CKD (chronic kidney disease), stage IV ([place])  1-Closed Left Hip Fracture:  Orthopedic consulted, cardiology consulted for pre op evaluation. Patient is high risk for procedure.  -Underwent treatment of intertrochanteric fracture with intramedullary implant with Dr. Erlinda Hong 12/1. -PT per Ortho. Recommending CIR -Bowel regimen.  -On eliquis.   2-CKD stage IV, acute on chronic.  Metabolic acidosis.  Cr baseline 2.4  Cr 3.6 Renal US: Mild fullness of the left renal collecting system without overt hydronephrosis. Normal echotexture. Nephrology consulted.  3-Heart failure with reduced ejection fraction, ejection fraction less than 20%, nonischemic cardiomyopathy: Follow-up with Dr. Trey Paula Cardiology consulted for preop assessment, patient felt to be high risk for procedure Hold Bumetadine.  Continue with Verquvo.  EF improved to 45 % by ECHO  4-Paroxysmal A-fib: Eliquis resume 12/03  5-Rheumatoid arthritis: Present immunosuppressant medication.  6-GERD: Continue with PPI  7-Hypertension: Continue with Midodrine.   8-Dysphagia; Report difficulty swallowing solid and liquids for  months. Continue with  IV PPI BID. She is considering esophagogram.   Acute blood loss anemia;  S/P one unit PRBC  Estimated body mass index is 17.91 kg/m as calculated from the following:   Height as of this encounter: '5\' 1"'$  (1.549 m).   Weight as of this encounter: 43 kg.   DVT prophylaxis: SCD Code Status: Full code Family Communication: no family at bedside.  Disposition Plan:  Status is: Inpatient Remains inpatient appropriate because: management of hip fracture.     Consultants:  Cardiology Dr Erlinda Hong with Ortho  Procedures:    Antimicrobials:    Subjective: She is trying to have a bowel movement.  She has been able to drink fluids She want to think about getting esophagogram.   Objective: Vitals:   07/11/22 1230 07/11/22 1445 07/11/22 1500 07/11/22 2200  BP:  (!) 149/81 (!) 149/81 120/73  Pulse: 72 71  77  Resp:   17 11  Temp: 98 F (36.7 C) 98.4 F (36.9 C)  98.2 F (36.8 C)  TempSrc: Oral Oral Oral Oral  SpO2:  99% 100% 99%  Weight:      Height:        Intake/Output Summary (Last 24 hours) at 07/12/2022 0724 Last data filed at 07/12/2022 0600 Gross per 24 hour  Intake 1252.06 ml  Output 817 ml  Net 435.06 ml    Filed Weights   07/08/22 1647 07/10/22 0911  Weight: 42.5 kg 43 kg    Examination:  General exam: NAD Respiratory system: CTA Cardiovascular system: S 1, S 2  RRR Gastrointestinal system: BS present, soft nt Central nervous system:  alert, answer questions.  Extremities: LE with dressing at surgical site, hip    Data Reviewed: I have personally  reviewed following labs and imaging studies  CBC: Recent Labs  Lab 07/08/22 2015 07/09/22 0632 07/10/22 0809 07/11/22 0248 07/12/22 0200  WBC 10.4 11.0* 10.4 9.2 8.7  NEUTROABS 9.1*  --   --   --   --   HGB 10.9* 9.8* 8.9* 7.6* 9.3*  HCT 36.0 31.2* 29.3* 24.6* 28.6*  MCV 95.7 94.0 95.1 95.3 91.1  PLT 246 228 244 224 235    Basic Metabolic Panel: Recent Labs  Lab  07/08/22 2015 07/09/22 0632 07/10/22 0809 07/11/22 0248 07/12/22 0200  NA 141 140 142 142 144  K 4.5 4.7 5.1 5.1 5.0  CL 108 112* 116* 120* 118*  CO2 20* 18* 17* 16* 16*  GLUCOSE 119* 129* 118* 127* 90  BUN 69* 65* 70* 65* 66*  CREATININE 3.33* 3.36* 3.75* 3.78* 3.67*  CALCIUM 9.6 9.0 9.4 9.1 9.2  MG  --  2.1  --   --   --   PHOS  --  5.1*  --   --   --     GFR: Estimated Creatinine Clearance: 8.9 mL/min (A) (by C-G formula based on SCr of 3.67 mg/dL (H)). Liver Function Tests: Recent Labs  Lab 07/07/22 1515 07/09/22 0632  AST 30  --   ALT 32  --   ALKPHOS 56  --   BILITOT 0.6  --   PROT 6.3*  --   ALBUMIN 3.2* 3.1*    No results for input(s): "LIPASE", "AMYLASE" in the last 168 hours. No results for input(s): "AMMONIA" in the last 168 hours. Coagulation Profile: No results for input(s): "INR", "PROTIME" in the last 168 hours. Cardiac Enzymes: No results for input(s): "CKTOTAL", "CKMB", "CKMBINDEX", "TROPONINI" in the last 168 hours. BNP (last 3 results) No results for input(s): "PROBNP" in the last 8760 hours. HbA1C: No results for input(s): "HGBA1C" in the last 72 hours. CBG: Recent Labs  Lab 07/08/22 2357 07/10/22 2041  GLUCAP 173* 144*    Lipid Profile: No results for input(s): "CHOL", "HDL", "LDLCALC", "TRIG", "CHOLHDL", "LDLDIRECT" in the last 72 hours. Thyroid Function Tests: No results for input(s): "TSH", "T4TOTAL", "FREET4", "T3FREE", "THYROIDAB" in the last 72 hours.  Anemia Panel: No results for input(s): "VITAMINB12", "FOLATE", "FERRITIN", "TIBC", "IRON", "RETICCTPCT" in the last 72 hours. Sepsis Labs: No results for input(s): "PROCALCITON", "LATICACIDVEN" in the last 168 hours.  Recent Results (from the past 240 hour(s))  Surgical pcr screen     Status: None   Collection Time: 07/10/22  5:28 AM   Specimen: Nasal Mucosa; Nasal Swab  Result Value Ref Range Status   MRSA, PCR NEGATIVE NEGATIVE Final   Staphylococcus aureus NEGATIVE  NEGATIVE Final    Comment: (NOTE) The Xpert SA Assay (FDA approved for NASAL specimens in patients 76 years of age and older), is one component of a comprehensive surveillance program. It is not intended to diagnose infection nor to guide or monitor treatment. Performed at [facility], [place] [address], [place], [place] [address]          Radiology Studies: US RENAL  Result Date: 07/12/2022 CLINICAL DATA:  Acute renal failure superimposed on chronic kidney disease stage 4 EXAM: RENAL / URINARY TRACT ULTRASOUND COMPLETE COMPARISON:  11/06/2020 FINDINGS: Right Kidney: Renal measurements: 9.5 x 3.5 x 3.7 cm = volume: 64 mL. Echogenicity within normal limits. No mass or hydronephrosis visualized. Left Kidney: Renal measurements: 9.2 x 4.5 x 4.0 cm = volume: 80 mL. Normal echotexture. No mass. Slight fullness of the collecting system.  Bladder: Appears normal for degree of bladder distention. Other: Small simple cyst in the right lobe of the liver measuring 1.5 cm. Increased echotexture throughout the liver suggesting fatty infiltration. IMPRESSION: Mild fullness of the left renal collecting system without overt hydronephrosis. Normal echotexture. Fatty liver. Electronically Signed   By: Rolm Baptise M.D.   On: 07/12/2022 03:53   ECHOCARDIOGRAM COMPLETE  Result Date: 07/11/2022    ECHOCARDIOGRAM REPORT   Patient Name:   Tracy Nelson Date of Exam: 07/11/2022 Medical Rec #:  [id number]         Height:       61.0 in Accession #:    [id number]        Weight:       94.8 lb Date of Birth:  07/08/1946          BSA:          1.375 m Patient Age:    57 years          BP:           112/71 mmHg Patient Gender: F                 HR:           88 bpm. Exam Location:  Inpatient Procedure: 2D Echo, Color Doppler and Cardiac Doppler Indications:    J67.34 Acute systolic (congestive) heart failure  History:        Patient has prior history of Echocardiogram examinations, most                 recent 12/05/2020.  CHF, Arrythmias:Atrial Flutter; Risk                 Factors:Hypertension.  Sonographer:    Raquel Sarna Senior RDCS Referring Phys: [name] IMPRESSIONS  1. Left ventricular ejection fraction, by estimation, is 45 to 50%. The left ventricle has mildly decreased function. The left ventricle demonstrates global hypokinesis. Left ventricular diastolic parameters are consistent with Grade I diastolic dysfunction (impaired relaxation).  2. Right ventricular systolic function is normal. The right ventricular size is normal. There is normal pulmonary artery systolic pressure. The estimated right ventricular systolic pressure is 19.3 mmHg.  3. The mitral valve is normal in structure. No evidence of mitral valve regurgitation. No evidence of mitral stenosis.  4. The aortic valve is calcified. There is mild calcification of the aortic valve. There is mild thickening of the aortic valve. Aortic valve regurgitation is not visualized. Aortic valve sclerosis is present, with no evidence of aortic valve stenosis.  5. The inferior vena cava is normal in size with greater than 50% respiratory variability, suggesting right atrial pressure of 3 mmHg. Comparison(s): Prior images reviewed side by side. The left ventricular function has improved. FINDINGS  Left Ventricle: Left ventricular ejection fraction, by estimation, is 45 to 50%. The left ventricle has mildly decreased function. The left ventricle demonstrates global hypokinesis. The left ventricular internal cavity size was normal in size. There is  no left ventricular hypertrophy. Left ventricular diastolic parameters are consistent with Grade I diastolic dysfunction (impaired relaxation). Right Ventricle: The right ventricular size is normal. No increase in right ventricular wall thickness. Right ventricular systolic function is normal. There is normal pulmonary artery systolic pressure. The tricuspid regurgitant velocity is 2.62 m/s, and  with an assumed right atrial pressure  of 3 mmHg, the estimated right ventricular systolic pressure is 79.0 mmHg. Left Atrium: Left atrial size was normal in size. Right Atrium: Right  atrial size was normal in size. Pericardium: There is no evidence of pericardial effusion. Mitral Valve: The mitral valve is normal in structure. No evidence of mitral valve regurgitation. No evidence of mitral valve stenosis. Tricuspid Valve: The tricuspid valve is normal in structure. Tricuspid valve regurgitation is mild . No evidence of tricuspid stenosis. Aortic Valve: The aortic valve is calcified. There is mild calcification of the aortic valve. There is mild thickening of the aortic valve. Aortic valve regurgitation is not visualized. Aortic valve sclerosis is present, with no evidence of aortic valve stenosis. Pulmonic Valve: The pulmonic valve was normal in structure. Pulmonic valve regurgitation is mild. No evidence of pulmonic stenosis. Aorta: The aortic root is normal in size and structure. Venous: The inferior vena cava is normal in size with greater than 50% respiratory variability, suggesting right atrial pressure of 3 mmHg. IAS/Shunts: No atrial level shunt detected by color flow Doppler.  LEFT VENTRICLE PLAX 2D LVIDd:         4.30 cm   Diastology LVIDs:         2.90 cm   LV e' medial:    5.55 cm/s LV PW:         1.00 cm   LV E/e' medial:  13.6 LV IVS:        1.00 cm   LV e' lateral:   8.38 cm/s LVOT diam:     2.00 cm   LV E/e' lateral: 9.0 LV SV:         51 LV SV Index:   37 LVOT Area:     3.14 cm  RIGHT VENTRICLE RV Basal diam:  2.40 cm RV S prime:     16.20 cm/s TAPSE (M-mode): 2.7 cm LEFT ATRIUM             Index        RIGHT ATRIUM           Index LA diam:        2.60 cm 1.89 cm/m   RA Area:     11.60 cm LA Vol (A2C):   22.1 ml 16.07 ml/m  RA Volume:   24.90 ml  18.11 ml/m LA Vol (A4C):   38.7 ml 28.14 ml/m LA Biplane Vol: 29.4 ml 21.38 ml/m  AORTIC VALVE LVOT Vmax:   106.00 cm/s LVOT Vmean:  65.200 cm/s LVOT VTI:    0.162 m  AORTA Ao Root diam:  3.00 cm MITRAL VALVE                TRICUSPID VALVE MV Area (PHT): 3.91 cm     TR Peak grad:   27.5 mmHg MV Decel Time: 194 msec     TR Vmax:        262.00 cm/s MV E velocity: 75.50 cm/s MV A velocity: 125.00 cm/s  SHUNTS MV E/A ratio:  0.60         Systemic VTI:  0.16 m                             Systemic Diam: 2.00 cm Candee Furbish MD Electronically signed by Candee Furbish MD Signature Date/Time: 07/11/2022/12:16:26 PM    Final    DG FEMUR MIN 2 VIEWS LEFT  Result Date: 07/10/2022 CLINICAL DATA:  Intramedullary nail intertrochanteric, surgery EXAM: LEFT FEMUR 2 VIEWS COMPARISON:  CT 07/08/2022 FINDINGS: Intraoperative images during left proximal femur intramedullary fixation for an intertrochanteric fracture. Improved fracture alignment. No evidence  of immediate complication. IMPRESSION: Intraoperative images during left proximal femur intramedullary fixation for an intertrochanteric fracture. Improved fracture alignment. No evidence of immediate complication. Electronically Signed   By: Maurine Simmering M.D.   On: 07/10/2022 13:02   DG C-Arm 1-60 Min-No Report  Result Date: 07/10/2022 Fluoroscopy was utilized by the requesting physician.  No radiographic interpretation.        Scheduled Meds:  amiodarone  200 mg Oral Daily   azaTHIOprine  100 mg Oral Daily   cholestyramine  4 g Oral Daily   docusate sodium  100 mg Oral BID   ferrous sulfate  325 mg Oral Q breakfast   hydroxychloroquine  200 mg Oral Daily   midodrine  5 mg Oral TID WC   multivitamin with minerals  1 tablet Oral Daily   pantoprazole (PROTONIX) IV  40 mg Intravenous Q12H   polyethylene glycol  17 g Oral BID   predniSONE  5 mg Oral Q breakfast   senna-docusate  1 tablet Oral QHS   Vericiguat  10 mg Oral Daily   Continuous Infusions:  sodium chloride     sodium chloride 50 mL/hr at 07/11/22 2203   methocarbamol (ROBAXIN) IV       LOS: 4 days    Time spent: 35 minutes    Is[place] Dohmen A Siddhi Dornbush, MD [facility]   If 7PM-7AM, please contact night-coverage [email or web address]  07/12/2022, 7:24 AM

## 2022-07-12 NOTE — Plan of Care (Signed)

## 2022-07-12 NOTE — Progress Notes (Signed)
Advanced Heart Failure Rounding Note  PCP-Cardiologist: None   Subjective:    No chest discomfort.  Has been battling with some mild nausea.  Was frustrated with multiple blood draws yesterday.   Objective:   Weight Range: 45.8 kg Body mass index is 19.08 kg/m.   Vital Signs:   Temp:  [98 F (36.7 C)-98.5 F (36.9 C)] 98.4 F (36.9 C) (12/03 0809) Pulse Rate:  [71-77] 77 (12/02 2200) Resp:  [11-20] 18 (12/03 0809) BP: (120-149)/(73-81) 120/73 (12/02 2200) SpO2:  [99 %-100 %] 99 % (12/02 2200) Weight:  [45.8 kg] 45.8 kg (12/03 0700) Last BM Date : 07/08/22  Weight change: Filed Weights   07/08/22 1647 07/10/22 0911 07/12/22 0700  Weight: 42.5 kg 43 kg 45.8 kg    Intake/Output:   Intake/Output Summary (Last 24 hours) at 07/12/2022 1142 Last data filed at 07/12/2022 0900 Gross per 24 hour  Intake 2002.1 ml  Output 817 ml  Net 1185.1 ml      Physical Exam    Alert and oriented in no acute distress with regular rate and rhythm lungs are clear flat affect.   Telemetry   Sinus rhythm  EKG    N/A  Labs    CBC Recent Labs    07/11/22 0248 07/12/22 0200  WBC 9.2 8.7  HGB 7.6* 9.3*  HCT 24.6* 28.6*  MCV 95.3 91.1  PLT 224 517   Basic Metabolic Panel Recent Labs    07/11/22 0248 07/12/22 0200  NA 142 144  K 5.1 5.0  CL 120* 118*  CO2 16* 16*  GLUCOSE 127* 90  BUN 65* 66*  CREATININE 3.78* 3.67*  CALCIUM 9.1 9.2   Liver Function Tests No results for input(s): "AST", "ALT", "ALKPHOS", "BILITOT", "PROT", "ALBUMIN" in the last 72 hours.  No results for input(s): "LIPASE", "AMYLASE" in the last 72 hours. Cardiac Enzymes No results for input(s): "CKTOTAL", "CKMB", "CKMBINDEX", "TROPONINI" in the last 72 hours.  BNP: BNP (last 3 results) Recent Labs    11/06/21 1241 03/04/22 1629 07/07/22 1515  BNP 190.3* 193.6* 232.0*    ProBNP (last 3 results) No results for input(s): "PROBNP" in the last 8760 hours.   D-Dimer No results for  input(s): "DDIMER" in the last 72 hours. Hemoglobin A1C No results for input(s): "HGBA1C" in the last 72 hours. Fasting Lipid Panel No results for input(s): "CHOL", "HDL", "LDLCALC", "TRIG", "CHOLHDL", "LDLDIRECT" in the last 72 hours. Thyroid Function Tests No results for input(s): "TSH", "T4TOTAL", "T3FREE", "THYROIDAB" in the last 72 hours.  Invalid input(s): "FREET3"   Other results:   Imaging    US RENAL  Result Date: 07/12/2022 CLINICAL DATA:  Acute renal failure superimposed on chronic kidney disease stage 4 EXAM: RENAL / URINARY TRACT ULTRASOUND COMPLETE COMPARISON:  11/06/2020 FINDINGS: Right Kidney: Renal measurements: 9.5 x 3.5 x 3.7 cm = volume: 64 mL. Echogenicity within normal limits. No mass or hydronephrosis visualized. Left Kidney: Renal measurements: 9.2 x 4.5 x 4.0 cm = volume: 80 mL. Normal echotexture. No mass. Slight fullness of the collecting system. Bladder: Appears normal for degree of bladder distention. Other: Small simple cyst in the right lobe of the liver measuring 1.5 cm. Increased echotexture throughout the liver suggesting fatty infiltration. IMPRESSION: Mild fullness of the left renal collecting system without overt hydronephrosis. Normal echotexture. Fatty liver. Electronically Signed   By: Rolm Baptise M.D.   On: 07/12/2022 03:53     Medications:     Scheduled Medications:  amiodarone  200 mg Oral Daily   apixaban  2.5 mg Oral BID   azaTHIOprine  100 mg Oral Daily   cholestyramine  4 g Oral Daily   docusate sodium  100 mg Oral BID   ferrous sulfate  325 mg Oral Q breakfast   hydroxychloroquine  200 mg Oral Daily   midodrine  5 mg Oral TID WC   multivitamin with minerals  1 tablet Oral Daily   pantoprazole (PROTONIX) IV  40 mg Intravenous Q12H   polyethylene glycol  17 g Oral BID   predniSONE  5 mg Oral Q breakfast   senna-docusate  1 tablet Oral QHS   Vericiguat  10 mg Oral Daily    Infusions:  sodium chloride     sodium chloride Stopped  (07/12/22 0830)   methocarbamol (ROBAXIN) IV      PRN Medications: acetaminophen, alum & mag hydroxide-simeth, HYDROmorphone (DILAUDID) injection, magnesium citrate, melatonin, menthol-cetylpyridinium **OR** phenol, methocarbamol **OR** methocarbamol (ROBAXIN) IV, ondansetron **OR** ondansetron (ZOFRAN) IV, oxyCODONE, oxyCODONE, prochlorperazine, sorbitol    Patient Profile   76 y/o female w/ 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 Ortho IMN.  AHF team originally asked to see for CV risk stratification/ surgical clearance.   Assessment/Plan   1.  Chronic systolic CHF: Nonischemic cardiomyopathy.  This has been known since 2015, cath in 2015 showed no coronary disease PAP scans , last in 2023 negative Genetic eval negative Cardiac MRI in 10/22 showed mild LV dilation with EF 23%, normal RV size with RVEF 37%, ECV 34%, small area of subendocardial LGE in the mid inferolateral wall and small area of full thickness LGE in the basal inferior wall.   This was not suggestive of cardiac amyloidosis but possibly could be consistent with coronary embolism (though no LV thrombus visualized) versus prior myocarditis versus sarcoidosis.  GDMT limited by renal function  and hypotension.  - Continue midodrine 5 mg tid.  - Hold diuretics.  - Continue Verquvo 10 mg daily.  - Off dapagliflozin with rise in creatinine.   - Off spironolactone with elevated creatinine and soft BP.   - Though RHC in 1/22 was not markedly abnormal, CPX in 3/22 showed severe functional limitation due to HF and BP also runs quite low.  Concerned she is nearing end-stage. Pt not  interested in barostimulation activation therapy, - Does not have an ICD, would not place given NICM, advanced age, and concern for nearing end stage HF.  -Echo on this admission on 07/11/2022 actually shows  improvement in her LVEF of approximately 45%. Posttransfusion.    2. Atrial fibrillation/flutter: S/p TEE-DCCV in 4/22.   - SR on tele  Continue   - Continue amiodarone 200 mg daily.  -Back on Eliquis 2.5 mg twice a day-had blood transfusion on 12/2.  No changes.  3. CKD: Stage 4.  She is followed by Dr. Royce Macadamia. - SCr 3.6, still elevated when compared to prior numbers 2.12 February 2022.  Discussed with Dr. Jonnie Finner, he will give cautious IV fluids and monitor.  No signs of hypoxia.  4  Anemia increased hemoglobin from 7.6-9.3 posttransfusion.  5 Rheumatoid arthritis: Continue immunosuppressives    Candee Furbish, MD  07/12/2022, 11:42 AM

## 2022-07-12 NOTE — Progress Notes (Signed)
ANTICOAGULATION CONSULT NOTE - follow up  Pharmacy Consult for Apixaban Indication: history of  Atrial fibrillation  Allergies  Allergen Reactions   Baclofen Nausea And Vomiting   Penicillin G     Other reaction(s): Unknown   Penicillins Rash    Patient Measurements: Height: '5\' 1"'$  (154.9 cm) Weight: 45.8 kg (100 lb 15.5 oz) IBW/kg (Calculated) : 47.8   Vital Signs: Temp: 98.4 F (36.9 C) (12/03 0809) Temp Source: Oral (12/03 0809)  Labs: Recent Labs    07/10/22 0809 07/11/22 0248 07/12/22 0200  HGB 8.9* 7.6* 9.3*  HCT 29.3* 24.6* 28.6*  PLT 244 224 228  CREATININE 3.75* 3.78* 3.67*     Estimated Creatinine Clearance: 9.4 mL/min (A) (by C-G formula based on SCr of 3.67 mg/dL (H)).   Medical History: Past Medical History:  Diagnosis Date   Acid reflux 07/15/2020   Arthritis 07/15/2020   CHF (congestive heart failure) (HCC)    Colitis 07/15/2020   Dilated cardiomyopathy ([place]) 07/15/2020   HFrEF (heart failure with reduced ejection fraction) ([place]) 07/15/2020   HTN (hypertension) 07/15/2020   Osteoporosis 07/15/2020   Palpitations 07/15/2020   Rheumatoid arthritis ([place]) 07/15/2020     Assessment: 76 y.o female   who sustained a left hip fracture.  s/p IM nailing of left hip on 07/10/22.   She is on apixaban prior to admission for Afib/flutter.   Apixaban was held preop for washout prior to this surgery.   Pharmacy consulted to resume Apixaban on POD #1 pending AM hgb being stable >/= 8 and not requiring blood transfusion.      Hgb 7.6 on POD #1 12/2>transfused 1ut PRBCs.  Today Hgb is 9.3, POD #2.  No bleeding noted.   Dr. Erlinda Hong, Ortho noted yesterday than we may resume Eliquis once  Hgb >8.0 Pltc wnl. H/o CKD stage IV: SCr 3.67  today  PTA Eliquis dose is 2.5 mg BID, which is appropriate dose due to weight < 60 kg, and Scr >1.5    Plan:  Resume Elqius 2.5 mg BID today. Pharmacy will sign off  Thank you for allowing pharmacy to be part of this patients care  team.  Nicole Cella, [facility] Clinical Pharmacist 07/12/2022,10:32 AM

## 2022-07-13 DIAGNOSIS — I5022 Chronic systolic (congestive) heart failure: Secondary | ICD-10-CM | POA: Diagnosis not present

## 2022-07-13 DIAGNOSIS — S72002A Fracture of unspecified part of neck of left femur, initial encounter for closed fracture: Secondary | ICD-10-CM | POA: Diagnosis not present

## 2022-07-13 LAB — URINALYSIS, ROUTINE W REFLEX MICROSCOPIC
Bilirubin Urine: NEGATIVE
Glucose, UA: NEGATIVE mg/dL
Hgb urine dipstick: NEGATIVE
Ketones, ur: NEGATIVE mg/dL
Leukocytes,Ua: NEGATIVE
Nitrite: NEGATIVE
Protein, ur: NEGATIVE mg/dL
Specific Gravity, Urine: 1.012 (ref 1.005–1.030)
pH: 5 (ref 5.0–8.0)

## 2022-07-13 LAB — CBC
HCT: 29.8 % — ABNORMAL LOW (ref 36.0–46.0)
Hemoglobin: 9.6 g/dL — ABNORMAL LOW (ref 12.0–15.0)
MCH: 29.9 pg (ref 26.0–34.0)
MCHC: 32.2 g/dL (ref 30.0–36.0)
MCV: 92.8 fL (ref 80.0–100.0)
Platelets: 246 10*3/uL (ref 150–400)
RBC: 3.21 MIL/uL — ABNORMAL LOW (ref 3.87–5.11)
RDW: 17.8 % — ABNORMAL HIGH (ref 11.5–15.5)
WBC: 8.3 10*3/uL (ref 4.0–10.5)
nRBC: 0.4 % — ABNORMAL HIGH (ref 0.0–0.2)

## 2022-07-13 LAB — BASIC METABOLIC PANEL
Anion gap: 9 (ref 5–15)
BUN: 56 mg/dL — ABNORMAL HIGH (ref 8–23)
CO2: 16 mmol/L — ABNORMAL LOW (ref 22–32)
Calcium: 8.6 mg/dL — ABNORMAL LOW (ref 8.9–10.3)
Chloride: 117 mmol/L — ABNORMAL HIGH (ref 98–111)
Creatinine, Ser: 3.11 mg/dL — ABNORMAL HIGH (ref 0.44–1.00)
GFR, Estimated: 15 mL/min — ABNORMAL LOW (ref >60)
Glucose, Bld: 152 mg/dL — ABNORMAL HIGH (ref 70–99)
Potassium: 4.5 mmol/L (ref 3.5–5.1)
Sodium: 142 mmol/L (ref 135–145)

## 2022-07-13 LAB — SODIUM, URINE, RANDOM: Sodium, Ur: 82 mmol/L

## 2022-07-13 LAB — CREATININE, URINE, RANDOM: Creatinine, Urine: 46 mg/dL

## 2022-07-13 MED ORDER — BUMETANIDE 2 MG PO TABS
2.0000 mg | ORAL_TABLET | Freq: Every day | ORAL | Status: DC
  Administered 2022-07-13: 2 mg via ORAL
  Filled 2022-07-13: qty 1

## 2022-07-13 MED ORDER — MIDODRINE HCL 5 MG PO TABS
2.5000 mg | ORAL_TABLET | Freq: Three times a day (TID) | ORAL | Status: DC
  Administered 2022-07-14 – 2022-07-15 (×5): 2.5 mg via ORAL
  Filled 2022-07-13 (×5): qty 1

## 2022-07-13 MED ORDER — PNEUMOCOCCAL 20-VAL CONJ VACC 0.5 ML IM SUSY
0.5000 mL | PREFILLED_SYRINGE | INTRAMUSCULAR | Status: DC
  Filled 2022-07-13 (×2): qty 0.5

## 2022-07-13 MED ORDER — BUMETANIDE 1 MG PO TABS
1.0000 mg | ORAL_TABLET | Freq: Every day | ORAL | Status: DC
  Administered 2022-07-14: 1 mg via ORAL
  Filled 2022-07-13 (×2): qty 1

## 2022-07-13 MED ORDER — POLYVINYL ALCOHOL 1.4 % OP SOLN
1.0000 [drp] | OPHTHALMIC | Status: DC | PRN
  Filled 2022-07-13 (×2): qty 15

## 2022-07-13 MED ORDER — PANTOPRAZOLE SODIUM 40 MG PO TBEC
40.0000 mg | DELAYED_RELEASE_TABLET | Freq: Two times a day (BID) | ORAL | Status: DC
  Administered 2022-07-13 – 2022-07-15 (×4): 40 mg via ORAL
  Filled 2022-07-13 (×4): qty 1

## 2022-07-13 NOTE — Progress Notes (Addendum)
Advanced Heart Failure Rounding Note  PCP-Cardiologist: Dr. Aundra Dubin   Subjective:    Tolerated surgery ok, s/p Lt IMN. No post-op complications, other than EBLA requiring transfusion. Hgb improved 7.6>>9.6.  C/w post operative pain. Denies CP and dyspnea.   Diuretic currently on hol. Daily wts trending up, though bed wts. Net + 3L this admit. SCr improving, 3.78>>3.67>>3.11 today.   Echo this admit actually shows improvement in LVEF ~45%.    Objective:   Weight Range: 45.8 kg Body mass index is 19.08 kg/m.   Vital Signs:   Temp:  [98.1 F (36.7 C)-98.4 F (36.9 C)] 98.4 F (36.9 C) (12/04 0741) Pulse Rate:  [84-94] 84 (12/04 0741) Resp:  [18-20] 20 (12/03 2100) BP: (114-143)/(71-93) 143/85 (12/04 0741) SpO2:  [100 %] 100 % (12/04 0741) Last BM Date : 07/12/22  Weight change: Filed Weights   07/08/22 1647 07/10/22 0911 07/12/22 0700  Weight: 42.5 kg 43 kg 45.8 kg    Intake/Output:   Intake/Output Summary (Last 24 hours) at 07/13/2022 0745 Last data filed at 07/13/2022 0600 Gross per 24 hour  Intake 2762.58 ml  Output 700 ml  Net 2062.58 ml      Physical Exam    General:  elderly thin AAF. NAD  HEENT: Normal Neck: Supple. JVD 10 cm. Carotids 2+ bilat; no bruits. No lymphadenopathy or thyromegaly appreciated. Cor: PMI nondisplaced. Regular rate & rhythm. No rubs, gallops or murmurs. Lungs: decreased BS at the bases bilaterally  Abdomen: Soft, nontender, nondistended. No hepatosplenomegaly. No bruits or masses. Good bowel sounds. Extremities: No cyanosis, clubbing, rash, edema Neuro: Alert & orientedx3, cranial nerves grossly intact. moves all 4 extremities w/o difficulty. Affect flat    Telemetry   Appears NSR, very low voltage p waves, 80s. Personally reviewed   EKG    N/A  Labs    CBC Recent Labs    07/12/22 0200 07/13/22 0405  WBC 8.7 8.3  HGB 9.3* 9.6*  HCT 28.6* 29.8*  MCV 91.1 92.8  PLT 228 478   Basic Metabolic Panel Recent  Labs    07/12/22 0200 07/13/22 0405  NA 144 142  K 5.0 4.5  CL 118* 117*  CO2 16* 16*  GLUCOSE 90 152*  BUN 66* 56*  CREATININE 3.67* 3.11*  CALCIUM 9.2 8.6*   Liver Function Tests No results for input(s): "AST", "ALT", "ALKPHOS", "BILITOT", "PROT", "ALBUMIN" in the last 72 hours.  No results for input(s): "LIPASE", "AMYLASE" in the last 72 hours. Cardiac Enzymes No results for input(s): "CKTOTAL", "CKMB", "CKMBINDEX", "TROPONINI" in the last 72 hours.  BNP: BNP (last 3 results) Recent Labs    11/06/21 1241 03/04/22 1629 07/07/22 1515  BNP 190.3* 193.6* 232.0*    ProBNP (last 3 results) No results for input(s): "PROBNP" in the last 8760 hours.   D-Dimer No results for input(s): "DDIMER" in the last 72 hours. Hemoglobin A1C No results for input(s): "HGBA1C" in the last 72 hours. Fasting Lipid Panel No results for input(s): "CHOL", "HDL", "LDLCALC", "TRIG", "CHOLHDL", "LDLDIRECT" in the last 72 hours. Thyroid Function Tests No results for input(s): "TSH", "T4TOTAL", "T3FREE", "THYROIDAB" in the last 72 hours.  Invalid input(s): "FREET3"   Other results:   Imaging    No results found.   Medications:     Scheduled Medications:  amiodarone  200 mg Oral Daily   apixaban  2.5 mg Oral BID   azaTHIOprine  100 mg Oral Daily   cholestyramine  4 g Oral Daily   docusate  sodium  100 mg Oral BID   ferrous sulfate  325 mg Oral Q breakfast   hydroxychloroquine  200 mg Oral Daily   midodrine  5 mg Oral TID WC   multivitamin with minerals  1 tablet Oral Daily   pantoprazole (PROTONIX) IV  40 mg Intravenous Q12H   polyethylene glycol  17 g Oral BID   predniSONE  5 mg Oral Q breakfast   senna-docusate  1 tablet Oral QHS   Vericiguat  10 mg Oral Daily    Infusions:  sodium chloride     sodium chloride Stopped (07/12/22 0830)   methocarbamol (ROBAXIN) IV      PRN Medications: acetaminophen, alum & mag hydroxide-simeth, HYDROmorphone (DILAUDID) injection,  magnesium citrate, melatonin, menthol-cetylpyridinium **OR** phenol, methocarbamol **OR** methocarbamol (ROBAXIN) IV, ondansetron **OR** ondansetron (ZOFRAN) IV, oxyCODONE, oxyCODONE, prochlorperazine, sorbitol    Patient Profile   76 y/o female w/ 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 Ortho has seen, planning possible IMN tomorrow. AHF team asked to see for CV risk stratification/ surgical clearance.   Assessment/Plan   1. Lt Hip Fx: 2/2 mechanical fall - s/p Lt IMN  - post op management per ortho - will need CIR  2. ABLA: EBL from surgery>>Hgb dropped 7.6  - s/p transfusion  - hgb trending up, 9.6 today  3. Chronic systolic CHF: Nonischemic cardiomyopathy.  This has been known since 2015, cath in 2015 showed no coronary disease and cardiac MRI in 2015 showed no LGE.  Cause is uncertain, familial cardiomyopathy is a concern given nonischemic cardiomyopathy in her mother.  Cannot rule out remote viral myocarditis. Poor appetite and severe HF limitation on CPX in 3/22 is concerning, as is elevated creatinine (suspect cardiorenal syndrome).  Low BP and elevated creatinine have limited her cardiac med regimen. RHC (1/22) showed low filling pressures and actually a relatively preserved cardiac index of 2.22.  PYP scan in 4/22 was equivocal, PYP scan repeated in 3/23 and likely negative.  Cardiac MRI in 10/22 showed mild LV dilation with EF 23%, normal RV size with RVEF 37%, ECV 34%, small area of subendocardial LGE in the mid inferolateral wall and small area of full thickness LGE in the basal inferior wall.   This was not suggestive of cardiac amyloidosis but possibly could be consistent with coronary embolism (though no LV thrombus visualized) versus prior myocarditis versus sarcoidosis. She does not appear volume overloaded on exam.  NYHA class IIIb  chronically. She is quite frail. GDMT limited by renal dysfunction and hypotension. Echo repeated this admit and EF improved ~45%, RV normal  - Continue midodrine 5 mg tid. Watch BP, may need reduction to 2.5 tid  - volume status trending up, Scr back to b/l. Restart PO diuretic, bumetanide 2 mg daily  - Continue Verquvo 10 mg daily.  - Off dapagliflozin with rise in creatinine.   - Off spironolactone with elevated creatinine and soft BP.   - Though RHC in 1/22 was not markedly abnormal, CPX in 3/22 showed severe functional limitation due to HF and BP also runs quite low.  Concerned she is nearing end-stage.  She is frail with CKD stage 4, I do not think that she would be a candidate for LVAD.  - With renal failure, CHF, and atrial arrhythmias, we have assessed for cardiac amyloidosis.  PYP scan was equivocal in 4/22 for transthyretin cardiac  amyloidosis.  It was repeated in 3/23 and likely negative.  [employer] gene testing for TTR mutations was negative. The cardiac MRI was not consistent with amyloidosis.  She has MGUS with monoclonal IgG paraprotein, but based on cMRI and slow progression, think AL amyloidosis is unlikely.   - EF now out of range for ICD  4. AKI on CKD: Stage 4.  She is followed by Dr. Royce Macadamia. - B/l SCr ~ 3.3>>bumped to 3.8 this admit  - improved to 3.1 today - monitor w/ restart of PO diuretics  5. Rheumatoid arthritis: No history of lung involvement. She has been on a low dose of prednisone chronically.  6. Atrial fibrillation/flutter: S/p TEE-DCCV in 4/22.   - NSR on tele   - Continue amiodarone 200 mg daily.  - Continue Eliquis 2.5 mg bid ( low dose due to elevated creatinine, low weight).  7. 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    Labs pending.  Length of Stay: [address] PA-C  07/13/2022, 7:45 AM  Advanced Heart Failure Team Pager [phone] (M-F; 7a - 5p)  Please contact [facility] for night-coverage after hours (5p -7a ) and weekends on  [email or web address]  Patient seen with PA, agree with the above note.   She seems stable this afternoon, eating dinner.    Repeat echo this admission with improvement, EF 45%.  BP actually running on the high side. Weight trending up, creatinine trending down (3.11 today).   General: NAD, frail Neck: No JVD, no thyromegaly or thyroid nodule.  Lungs: Clear to auscultation bilaterally with normal respiratory effort. CV: Nondisplaced PMI.  Heart regular S1/S2, no S3/S4, no murmur.  No peripheral edema.   Abdomen: Soft, nontender, no hepatosplenomegaly, no distention.  Skin: Intact without lesions or rashes.  Neurologic: Alert and oriented x 3.  Psych: Normal affect. Extremities: No clubbing or cyanosis.  HEENT: Normal.   BP running relatively high, I will decrease midodrine to 2.5 mg tid.  Need to make sure she is not orthostatic when she stands.   Start back on bumetanide, will use 1 mg daily.   Continue PT.   Loralie Champagne 07/13/2022 5:44 PM

## 2022-07-13 NOTE — Progress Notes (Signed)
Physical Therapy Treatment Patient Details Name: Tracy Nelson MRN: [id number] DOB: 1946-07-08 Today's Date: 07/13/2022   History of Present Illness Pt is a 76 y.o. female admitted s/p fall and left intertrochanteric fracture. Underwent IM nailing on 07/10/22.  PMH significant for HFrEF<20%, nonischemic cardiomyopathy, rheumatoid arthritis on immunosuppressants, GERD, CKD IV.,    PT Comments    Pt seen for PT tx with pt agreeable. Pt received in bed, with coffee spilled & pt had put sodium substitute in coffee vs creamer. Pt required max assist with supine>sit even with use of hospital bed features & extra time to complete. Pt requires mod<>max assist +1 for step pivot & mod assist to attempt to take 2 steps forwards/backwards with RW. Pt demonstrates R lateral lean in sitting & standing, 2/2 decreased willingness to weight shift to LLE. Pt also with HR up to 127 bpm with stepping attempts. PT assisted pt with changing into clean gown & getting set up with meal tray/new coffee. Unsure if pt can tolerate 3 hours of therapy/day at CIR level, have updated d/c recommendation to SNF. Will continue to follow pt acutely to address strengthening, balance, and gait with LRAD.     Recommendations for follow up therapy are one component of a multi-disciplinary discharge planning process, led by the attending physician.  Recommendations may be updated based on patient status, additional functional criteria and insurance authorization.  Follow Up Recommendations  Skilled nursing-short term rehab (<3 hours/day) Can patient physically be transported by private vehicle: No   Assistance Recommended at Discharge Frequent or constant Supervision/Assistance  Patient can return home with the following A lot of help with walking and/or transfers;A lot of help with bathing/dressing/bathroom;Assist for transportation;Assistance with cooking/housework;Help with stairs or ramp for entrance;Direct supervision/assist for  financial management   Equipment Recommendations  Rolling walker (2 wheels);BSC/3in1    Recommendations for Other Services       Precautions / Restrictions Precautions Precautions: Fall Restrictions Weight Bearing Restrictions: Yes LLE Weight Bearing: Weight bearing as tolerated     Mobility  Bed Mobility Overal bed mobility: Needs Assistance Bed Mobility: Supine to Sit     Supine to sit: Mod assist, Max assist, HOB elevated     General bed mobility comments: Pt attempts to move LLE to EOB but does require assistance, assistance to upright trunk & scoot forward to come to sitting EOB.    Transfers Overall transfer level: Needs assistance Equipment used: Rolling walker (2 wheels) Transfers: Bed to chair/wheelchair/BSC, Sit to/from Stand Sit to Stand: Mod assist (cuing to scoot out to edge of seat, posterior lean upon sit>stand.)   Step pivot transfers: Mod assist, Max assist       General transfer comment: Pt completes bed>recliner via step pivot with pt demonstrating strong R lateral lean, PT attempting to facilitate weight shift to L to allow increased ease of stepping with RLE. Max cuing for sequencing/technique to fully turn to recliner, reach back for armrests & sit.    Ambulation/Gait         Gait velocity: significantly decreased     General Gait Details: Pt attempts to take 2 steps forwards/backwards with RW & mod assist. Pt with reluctance to decreased weight shifting to L to allow increased ease of stepping with RLE. Pt with decreased step & stride length. Gait limited by pain, fatigue, & HR up to 127 bpm.   Stairs             Wheelchair Mobility    Modified Rankin (  Stroke Patients Only)       Balance Overall balance assessment: Needs assistance Sitting-balance support: Feet supported, Bilateral upper extremity supported Sitting balance-Leahy Scale: Fair Sitting balance - Comments: supervision sitting EOB, R lateral lean   Standing  balance support: Bilateral upper extremity supported, Reliant on assistive device for balance Standing balance-Leahy Scale: Poor Standing balance comment: Pt demonstrates strong R lateral lean in standing/step pivot transfesr, as well as posterior lean. Requires BUE support on RW.                            Cognition Arousal/Alertness: Awake/alert Behavior During Therapy: WFL for tasks assessed/performed Overall Cognitive Status: No family/caregiver present to determine baseline cognitive functioning                                 General Comments: Pt received in bed with coffee spilled on her & bed, pt had put sodium substitute in coffee. Pt demonstrates decreased emergent & anticipatory awareness. Pt reports having a "pity party" re: current situation.        Exercises      General Comments General comments (skin integrity, edema, etc.): HR up to 127 bpm with stepping attempts.      Pertinent Vitals/Pain Pain Assessment Pain Assessment: Faces Faces Pain Scale: Hurts a little bit Pain Location: left hip Pain Descriptors / Indicators: Sore, Guarding Pain Intervention(s): Monitored during session    Home Living                          Prior Function            PT Goals (current goals can now be found in the care plan section) Acute Rehab PT Goals Patient Stated Goal: get better PT Goal Formulation: With patient Time For Goal Achievement: 07/25/22 Potential to Achieve Goals: Good Progress towards PT goals: Progressing toward goals    Frequency    Min 3X/week      PT Plan Discharge plan needs to be updated;Frequency needs to be updated    Co-evaluation              AM-PAC PT "6 Clicks" Mobility   Outcome Measure  Help needed turning from your back to your side while in a flat bed without using bedrails?: A Lot Help needed moving from lying on your back to sitting on the side of a flat bed without using bedrails?:  Total Help needed moving to and from a bed to a chair (including a wheelchair)?: Total Help needed standing up from a chair using your arms (e.g., wheelchair or bedside chair)?: Total Help needed to walk in hospital room?: Total Help needed climbing 3-5 steps with a railing? : Total 6 Click Score: 7    End of Session Equipment Utilized During Treatment: Gait belt Activity Tolerance: Patient tolerated treatment well;Patient limited by fatigue Patient left: in chair;with chair alarm set;with call bell/phone within reach Nurse Communication: Mobility status PT Visit Diagnosis: Unsteadiness on feet (R26.81);Other abnormalities of gait and mobility (R26.89);History of falling (Z91.81);Difficulty in walking, not elsewhere classified (R26.2);Pain;Muscle weakness (generalized) (M62.81) Pain - Right/Left: Left Pain - part of body: Hip     Time: 7124-5809 PT Time Calculation (min) (ACUTE ONLY): 22 min  Charges:  $Therapeutic Activity: 8-22 mins  Lavone Nian, PT, DPT 07/13/22, 10:21 AM  Waunita Schooner 07/13/2022, 10:20 AM

## 2022-07-13 NOTE — Progress Notes (Signed)
   07/13/22 1040  Clinical Encounter Type  Visited With Health care provider;Patient not available  Visit Type Initial  Referral From Nurse  Consult/Referral To Chaplain   Chaplain attempted to visit with patient to discuss advance directive per the Spiritual Care list. Patient was receiving care and speaking with doctor. Nurse Cathlean Cower indicated that patient would like to execute a [place] and suggested that family be present. Chaplain provided contact pager number and asked that nurse call when family arrives.  (*Note:  Chaplain checked at 2:23pm and family had not arrived).  Melody Haver, Resident Chaplain [phone]

## 2022-07-13 NOTE — Progress Notes (Signed)
[name] Progress Note  Subjective: hemodynamically stable,  UOP 700-  BUN and crt both down   Vitals:   07/12/22 1100 07/12/22 2100 07/13/22 0741 07/13/22 0848  BP: 114/71 (!) 128/93 (!) 143/85 (!) 149/84  Pulse: 94 86 84   Resp:  20    Temp: 98.3 F (36.8 C) 98.1 F (36.7 C) 98.4 F (36.9 C)   TempSrc: Oral Oral Oral   SpO2:  100% 100%   Weight:      Height:        Exam: Gen alert, frail elderly, pleasant-  sitting up in chair-  did eat breakfast  BP's good , on RA No jvd or bruits Chest clear bilat to bases RRR no MRG Abd soft ntnd no mass or ascites +bs Ext no LE  edema Neuro is alert, Ox 3 , nf, deconditioned       Home meds include - amiodarone, imuran 100 qd, bumex 1 mg qd, eliquis, ensure, plaquenil 200 qd, midodrine 5 tid, prilosec, prednisone 5 qam, vericiguat, questran     UA pend    Una, Ucr pending    Renal US - 9.5/ 9.2 cm kidneys, mild fullness of the L collecting system.      CXR 11/29 - clear , no edema    Assessment/ Plan: AKI on CKD4 - b/l creat 2.2- 2.6 from jan - July 2023, eGFR 18- 60m/min. F[facility] Creat 3.3 on admission after a fall w/ L hip fx. Diuretics held and home midodrine continued due to hypotension. Pt went to OR 12/01 for ORIF of hip fx. Creat bumped to 3.7. UA completely bland.  Renal UKoreaw/o sig obstruction. No hypotension, contrast, nephrotoxins. Exam showed no edema, CXR clear. EF better by echo at 40-45%. Suspected AKI due to dehydration/ATN from low BPs. Home bumex on hold-  please continue to hold.  IVF"s started  but now stopped.  I think is OK since now eating and drinking.  creat  improved as well as UOP.  L hip fx - sp ORIF on 12/01 NICM - prior LVEF < 20%, now is around 45-50%. Improved.  Atrial fib - paroxysmal Chronic hypotension - BP's wnl here RA - on sig immunosuppression Dysphagia - for esophagram on Monday Anemia - Hb dropped, sp 1u prbc's. Hb 9.3 now.      Tracy Nelson 07/13/2022, 10:50 AM   Recent Labs  Lab 07/07/22 1515 07/08/22 0807 07/09/22 0632 07/10/22 0809 07/12/22 0200 07/13/22 0405  HGB 10.5*   < > 9.8*   < > 9.3* 9.6*  ALBUMIN 3.2*  --  3.1*  --   --   --   CALCIUM 9.5   < > 9.0   < > 9.2 8.6*  PHOS  --   --  5.1*  --   --   --   CREATININE 3.35*   < > 3.36*   < > 3.67* 3.11*  K 4.6   < > 4.7   < > 5.0 4.5   < > = values in this interval not displayed.   No results for input(s): "IRON", "TIBC", "FERRITIN" in the last 168 hours. Inpatient medications:  amiodarone  200 mg Oral Daily   apixaban  2.5 mg Oral BID   azaTHIOprine  100 mg Oral Daily   bumetanide  2 mg Oral Daily   cholestyramine  4 g Oral Daily   docusate sodium  100 mg Oral BID   ferrous sulfate  325 mg Oral Q breakfast   hydroxychloroquine  200 mg Oral Daily   midodrine  5 mg Oral TID WC   multivitamin with minerals  1 tablet Oral Daily   pantoprazole  40 mg Oral BID   [START ON 07/14/2022] pneumococcal 20-valent conjugate vaccine  0.5 mL Intramuscular Tomorrow-1000   polyethylene glycol  17 g Oral BID   predniSONE  5 mg Oral Q breakfast   senna-docusate  1 tablet Oral QHS   Vericiguat  10 mg Oral Daily    sodium chloride     methocarbamol (ROBAXIN) IV     acetaminophen, alum & mag hydroxide-simeth, HYDROmorphone (DILAUDID) injection, magnesium citrate, melatonin, menthol-cetylpyridinium **OR** phenol, methocarbamol **OR** methocarbamol (ROBAXIN) IV, ondansetron **OR** ondansetron (ZOFRAN) IV, oxyCODONE, oxyCODONE, polyvinyl alcohol, prochlorperazine, sorbitol

## 2022-07-13 NOTE — Progress Notes (Signed)
PHARMACIST - PHYSICIAN COMMUNICATION  DR:   Tyrell Antonio  CONCERNING: IV to Oral Route Change Policy  RECOMMENDATION: This patient is receiving pantoprazole by the intravenous route.  Based on criteria approved by the Pharmacy and Therapeutics Committee, the intravenous medication(s) is/are being converted to the equivalent oral dose form(s).   DESCRIPTION: These criteria include: The patient is eating (either orally or via tube) and/or has been taking other orally administered medications for a least 24 hours The patient has no evidence of active gastrointestinal bleeding or impaired GI absorption (gastrectomy, short bowel, patient on TNA or NPO).  If you have questions about this conversion, please contact the Pharmacy Department  '[]'$   [phone] )  [facility] '[]'$   [phone] )  [facility] '[x]'$   [phone] )  [facility] '[]'$   [phone] )  [facility] '[]'$   [phone] )  [place], PharmD, BCPS 07/13/2022 9:39 AM

## 2022-07-13 NOTE — Care Management Important Message (Signed)
Important Message  Patient Details  Name: Tracy Nelson MRN: [id number] Date of Birth: 11/20/1945   Medicare Important Message Given:  Yes     Hannah Beat 07/13/2022, 1:01 PM

## 2022-07-13 NOTE — Progress Notes (Signed)
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  She was found to have a left intertrochanteric fracture.  Orthopedic Dr. Erlinda Hong was consulted plan is for surgery on 12/01 after Eliquis washout.   Patient underwent treatment of intertrochanteric fracture with intramedullary implant by Dr. Rigoberto Noel on 12/1.  Postop she developed AKI on CKD.  Received IV fluids.  She is known to have nonischemic cardiomyopathy with prior ejection fraction  less than 20%.  Repeated echo during this hospitalization showed ejection fraction 45%.  Heart failure team has been following.  Patient will require rehab at discharge.   Assessment & Plan:   Principal Problem:   Closed left hip fracture, initial encounter ([place]) Active Problems:   Non-ischemic cardiomyopathy (HCC)   HFrEF (heart failure with reduced ejection fraction) (HCC)   HTN (hypertension)   Atrial flutter (HCC)   CKD (chronic kidney disease), stage IV ([place])  1-Closed Left Hip Fracture:  -Orthopedic consulted, cardiology consulted for pre op evaluation. Patient is high risk for procedure.  -Underwent treatment of intertrochanteric fracture with intramedullary implant with Dr. Erlinda Hong 12/1. -PT recommending skilled nursing facility.  Social worker consulted -Bowel regimen.  Had a bowel movement -On eliquis.   2-CKD stage IV, acute on chronic.  Metabolic acidosis.  Cr baseline 2.4  Cr 3.6 Renal US: Mild fullness of the left renal collecting system without overt hydronephrosis. Normal echotexture. Nephrology consulted.  Appreciate nephrology assistance.  Patient received IV fluids. AKI in the  setting of hypotension, ATN.  Plan to continue to hold diuretics  3-Heart failure with reduced ejection fraction, ejection fraction less than 20%, nonischemic cardiomyopathy: Follow-up with Dr. Trey Paula Cardiology consulted for preop assessment, patient felt to be high risk for procedure Hold Bumetadine.  Continue with Verquvo.  EF improved to 45 % by ECHO  4-Paroxysmal A-fib: Eliquis resume 12/03  5-Rheumatoid arthritis: Present immunosuppressant medication.  6-GERD: Continue with PPI  7-Hypertension: Continue with Midodrine.   8-Dysphagia; Report difficulty swallowing solid and liquids for months. Continue with  IV PPI BID. She is considering esophagogram.   Acute blood loss anemia;  S/P one unit PRBC Hemoglobin stable at 9 Estimated body mass index is 19.08 kg/m as calculated from the following:   Height as of this encounter: '5\' 1"'$  (1.549 m).   Weight as of this encounter: 45.8 kg.   DVT prophylaxis: SCD Code Status: Full code Family Communication: no family at bedside.  Disposition Plan:  Status is: Inpatient Remains inpatient appropriate because: management of hip fracture.     Consultants:  Cardiology Dr Erlinda Hong with Ortho  Procedures:    Antimicrobials:    Subjective: She denies worsening shortness of breath or chest pain.  She still that sinking feeling in her stomach.  Thinking about getting the esophagogram   Objective: Vitals:   07/12/22 2100 07/13/22 0741 07/13/22 0848 07/13/22 1455  BP: (!) 128/93 (!) 143/85 (!) 149/84 139/88  Pulse: 86 84  95  Resp: 20     Temp: 98.1 F (36.7 C) 98.4 F (36.9 C)  97.8 F (36.6 C)  TempSrc: Oral Oral  Oral  SpO2: 100% 100%  100%  Weight:      Height:  Intake/Output Summary (Last 24 hours) at 07/13/2022 1540 Last data filed at 07/13/2022 0600 Gross per 24 hour  Intake 1652.54 ml  Output 450 ml  Net 1202.54 ml    Filed Weights   07/08/22 1647 07/10/22 0911 07/12/22 0700  Weight: 42.5 kg 43 kg 45.8  kg    Examination:  General exam: NAD Respiratory system: CTA Cardiovascular system: S 1, S 2 RRR Gastrointestinal system: BS present, soft, nt Central nervous system:  Alert, answer questions.  Extremities: LE with dressing at surgical site, hip    Data Reviewed: I have personally reviewed following labs and imaging studies  CBC: Recent Labs  Lab 07/08/22 2015 07/09/22 0632 07/10/22 0809 07/11/22 0248 07/12/22 0200 07/13/22 0405  WBC 10.4 11.0* 10.4 9.2 8.7 8.3  NEUTROABS 9.1*  --   --   --   --   --   HGB 10.9* 9.8* 8.9* 7.6* 9.3* 9.6*  HCT 36.0 31.2* 29.3* 24.6* 28.6* 29.8*  MCV 95.7 94.0 95.1 95.3 91.1 92.8  PLT 246 228 244 224 228 573    Basic Metabolic Panel: Recent Labs  Lab 07/09/22 0632 07/10/22 0809 07/11/22 0248 07/12/22 0200 07/13/22 0405  NA 140 142 142 144 142  K 4.7 5.1 5.1 5.0 4.5  CL 112* 116* 120* 118* 117*  CO2 18* 17* 16* 16* 16*  GLUCOSE 129* 118* 127* 90 152*  BUN 65* 70* 65* 66* 56*  CREATININE 3.36* 3.75* 3.78* 3.67* 3.11*  CALCIUM 9.0 9.4 9.1 9.2 8.6*  MG 2.1  --   --   --   --   PHOS 5.1*  --   --   --   --     GFR: Estimated Creatinine Clearance: 11.1 mL/min (A) (by C-G formula based on SCr of 3.11 mg/dL (H)). Liver Function Tests: Recent Labs  Lab 07/07/22 1515 07/09/22 0632  AST 30  --   ALT 32  --   ALKPHOS 56  --   BILITOT 0.6  --   PROT 6.3*  --   ALBUMIN 3.2* 3.1*    No results for input(s): "LIPASE", "AMYLASE" in the last 168 hours. No results for input(s): "AMMONIA" in the last 168 hours. Coagulation Profile: No results for input(s): "INR", "PROTIME" in the last 168 hours. Cardiac Enzymes: No results for input(s): "CKTOTAL", "CKMB", "CKMBINDEX", "TROPONINI" in the last 168 hours. BNP (last 3 results) No results for input(s): "PROBNP" in the last 8760 hours. HbA1C: No results for input(s): "HGBA1C" in the last 72 hours. CBG: Recent Labs  Lab 07/08/22 2357 07/10/22 2041  GLUCAP 173* 144*    Lipid  Profile: No results for input(s): "CHOL", "HDL", "LDLCALC", "TRIG", "CHOLHDL", "LDLDIRECT" in the last 72 hours. Thyroid Function Tests: No results for input(s): "TSH", "T4TOTAL", "FREET4", "T3FREE", "THYROIDAB" in the last 72 hours.  Anemia Panel: No results for input(s): "VITAMINB12", "FOLATE", "FERRITIN", "TIBC", "IRON", "RETICCTPCT" in the last 72 hours. Sepsis Labs: No results for input(s): "PROCALCITON", "LATICACIDVEN" in the last 168 hours.  Recent Results (from the past 240 hour(s))  Surgical pcr screen     Status: None   Collection Time: 07/10/22  5:28 AM   Specimen: Nasal Mucosa; Nasal Swab  Result Value Ref Range Status   MRSA, PCR NEGATIVE NEGATIVE Final   Staphylococcus aureus NEGATIVE NEGATIVE Final    Comment: (NOTE) The Xpert SA Assay (FDA approved for NASAL specimens in patients 21 years of age and older), is one component of a comprehensive surveillance program. It is not intended to  diagnose infection nor to guide or monitor treatment. Performed at [facility], [place] [address], [place], [place] [address]          Radiology Studies: US RENAL  Result Date: 07/12/2022 CLINICAL DATA:  Acute renal failure superimposed on chronic kidney disease stage 4 EXAM: RENAL / URINARY TRACT ULTRASOUND COMPLETE COMPARISON:  11/06/2020 FINDINGS: Right Kidney: Renal measurements: 9.5 x 3.5 x 3.7 cm = volume: 64 mL. Echogenicity within normal limits. No mass or hydronephrosis visualized. Left Kidney: Renal measurements: 9.2 x 4.5 x 4.0 cm = volume: 80 mL. Normal echotexture. No mass. Slight fullness of the collecting system. Bladder: Appears normal for degree of bladder distention. Other: Small simple cyst in the right lobe of the liver measuring 1.5 cm. Increased echotexture throughout the liver suggesting fatty infiltration. IMPRESSION: Mild fullness of the left renal collecting system without overt hydronephrosis. Normal echotexture. Fatty liver. Electronically Signed   By:  Rolm Baptise M.D.   On: 07/12/2022 03:53        Scheduled Meds:  amiodarone  200 mg Oral Daily   apixaban  2.5 mg Oral BID   azaTHIOprine  100 mg Oral Daily   cholestyramine  4 g Oral Daily   docusate sodium  100 mg Oral BID   ferrous sulfate  325 mg Oral Q breakfast   hydroxychloroquine  200 mg Oral Daily   midodrine  5 mg Oral TID WC   multivitamin with minerals  1 tablet Oral Daily   pantoprazole  40 mg Oral BID   [START ON 07/14/2022] pneumococcal 20-valent conjugate vaccine  0.5 mL Intramuscular Tomorrow-1000   polyethylene glycol  17 g Oral BID   predniSONE  5 mg Oral Q breakfast   senna-docusate  1 tablet Oral QHS   Vericiguat  10 mg Oral Daily   Continuous Infusions:  sodium chloride     methocarbamol (ROBAXIN) IV       LOS: 5 days    Time spent: 35 minutes    Johndavid Geralds A Audelia Knape, MD [facility]   If 7PM-7AM, please contact night-coverage [email or web address]  07/13/2022, 3:40 PM

## 2022-07-13 NOTE — TOC CM/SW Note (Signed)
HF TOC CM review chart and PT recommended SNF rehab. Unit CSW will follow for SNF placement. [place], Heart Failure TOC CM [phone]

## 2022-07-14 DIAGNOSIS — S72002A Fracture of unspecified part of neck of left femur, initial encounter for closed fracture: Secondary | ICD-10-CM | POA: Diagnosis not present

## 2022-07-14 DIAGNOSIS — I5022 Chronic systolic (congestive) heart failure: Secondary | ICD-10-CM | POA: Diagnosis not present

## 2022-07-14 LAB — BASIC METABOLIC PANEL
Anion gap: 9 (ref 5–15)
BUN: 44 mg/dL — ABNORMAL HIGH (ref 8–23)
CO2: 18 mmol/L — ABNORMAL LOW (ref 22–32)
Calcium: 8.7 mg/dL — ABNORMAL LOW (ref 8.9–10.3)
Chloride: 112 mmol/L — ABNORMAL HIGH (ref 98–111)
Creatinine, Ser: 2.67 mg/dL — ABNORMAL HIGH (ref 0.44–1.00)
GFR, Estimated: 18 mL/min — ABNORMAL LOW (ref >60)
Glucose, Bld: 89 mg/dL (ref 70–99)
Potassium: 4.5 mmol/L (ref 3.5–5.1)
Sodium: 139 mmol/L (ref 135–145)

## 2022-07-14 LAB — MAGNESIUM: Magnesium: 2 mg/dL (ref 1.7–2.4)

## 2022-07-14 NOTE — Progress Notes (Signed)
Inpatient Rehab Admissions Coordinator:   I screened pt. For CIR candidacy per [facility] request. Unfortunately, per current payor trends, Payor  is very unlikely to approve CIR level of care for pt.'s diagnoses. I will not pursue CIR consult.  Clemens Catholic, [place], [place] Admissions Coordinator  [phone] ([place]) [phone] (office)

## 2022-07-14 NOTE — Progress Notes (Signed)
Mobility Specialist Progress Note   07/14/22 1600  Mobility  Activity Stood at bedside;Dangled on edge of bed  Level of Assistance Minimal assist, patient does 75% or more  Assistive Device Front wheel walker  LLE Weight Bearing WBAT  Activity Response Tolerated well  $Mobility charge 1 Mobility   Pre Mobility: 80 HR, 103/76 BP During Mobility: 89 HR, 104/78 BP Post Mobility: 81 HR, 120/89 BP  Received pt in bed c/o pain in L hip but not specifying how much, agreeable to mobility after encouragement. MinA to get LLE to EOB and modA to stand d/t LLE weakness + decreased pain tolerance. Pt only able partially stand d/t pain and requesting to sit back down. Once seated, performed seated level exercises w/o incident but having a decrease activity tolerance. Pt returned back supine w/ all needs met and RN present in room.    Holland Falling Mobility Specialist Please contact via SecureChat or  Rehab office at [phone]

## 2022-07-14 NOTE — TOC Initial Note (Addendum)
Transition of Care [facility]) - Initial/Assessment Note    Patient Details  Name: Tracy Nelson MRN: [id number] Date of Birth: 09-30-1945  [facility]) CM/SW Contact:    Joanne Chars, LCSW Phone Number: 07/14/2022, 10:21 AM  Clinical Narrative:     CSW met with pt regarding DC recommendation for SNF.  Pt initially recommended for CIR, still interested in this, but does agree to SNF, worried about going to "rest home."  Pt lives with daughter and daughter's family (husband, kids).  No current services.  Permission given to speak with daughter and SIL.  Permission given to send out referral in hub.  Choice document given.  Pt is HD pt.  Pt is not vaccinated for covid.             1315: CSW spoke to pt daughter Alfeefa over the phone, current bed offers provided, she asked for response from [facility].  CSW reached out to Tracy/[facility].  Expected Discharge Plan: Skilled Nursing Facility Barriers to Discharge: Continued Medical Work up, SNF Pending bed offer   Patient Goals and CMS Choice Patient states their goals for this hospitalization and ongoing recovery are:: walk again CMS Medicare.gov Compare Post Acute Care list provided to:: Patient Choice offered to / list presented to : Patient  Expected Discharge Plan and Services Expected Discharge Plan: [place] In-house Referral: Clinical Social Work   Post Acute Care Choice: [place] Living arrangements for the past 2 months: [place]                                      Prior Living Arrangements/Services Living arrangements for the past 2 months: Single Family Home Lives with:: Adult Children, Relatives (daughter and daughter's family) Patient language and need for interpreter reviewed:: Yes Do you feel safe going back to the place where you live?: Yes      Need for Family Participation in Patient Care: Yes (Comment) Care giver support system in place?: Yes  (comment) Current home services: Other (comment) (none) Criminal Activity/Legal Involvement Pertinent to Current Situation/Hospitalization: No - Comment as needed  Activities of Daily Living Home Assistive Devices/Equipment: None ADL Screening (condition at time of admission) Patient's cognitive ability adequate to safely complete daily activities?: Yes Is the patient deaf or have difficulty hearing?: No Does the patient have difficulty seeing, even when wearing glasses/contacts?: No Does the patient have difficulty concentrating, remembering, or making decisions?: No Patient able to express need for assistance with ADLs?: No Does the patient have difficulty dressing or bathing?: Yes Independently performs ADLs?: Yes (appropriate for developmental age) Does the patient have difficulty walking or climbing stairs?: Yes Weakness of Legs: Left Weakness of Arms/Hands: None  Permission Sought/Granted Permission sought to share information with : Family Supports Permission granted to share information with : Yes, Verbal Permission Granted  Share Information with NAME: daughter Afeefa and also son in law (don't have name)  Permission granted to share info w AGENCY: SNF        Emotional Assessment Appearance:: Appears stated age Attitude/Demeanor/Rapport: Engaged Affect (typically observed): Appropriate, Pleasant Orientation: : Oriented to Self, Oriented to Place, Oriented to  Time, Oriented to Situation Alcohol / Substance Use: Not Applicable Psych Involvement: No (comment)  Admission diagnosis:  Closed fracture of left hip, initial encounter ([place]) [S72.002A] Closed left hip fracture, initial encounter [facility]) [S72.002A] Patient Active Problem List  Diagnosis Date Noted   Chronic systolic CHF (congestive heart failure) ([place]) 07/13/2022   CKD (chronic kidney disease), stage IV ([place]) 07/09/2022   Closed left hip fracture, initial encounter ([place]) 07/08/2022   Corneal melt, 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   Monoclonal gammopathy 03/10/2021   Hypotension 10/22/2020   Atypical chest pain 10/22/2020   CKD (chronic kidney disease), stage III ([place]) 10/22/2020   Nausea & vomiting 10/22/2020   Physical deconditioning 10/22/2020   Atrial flutter ([place]) 10/18/2020   Arthritis 07/15/2020   GERD (gastroesophageal reflux disease) 07/15/2020   Colitis 07/15/2020   Non-ischemic cardiomyopathy ([place]) 07/15/2020   HFrEF (heart failure with reduced ejection fraction) ([place]) 07/15/2020   HTN (hypertension) 07/15/2020   Osteoporosis 07/15/2020   Palpitations 07/15/2020   Acute gout of left shoulder 07/10/2020   Rheumatoid arthritis involving multiple sites with positive rheumatoid factor ([place]) 10/28/2015   Mitral regurgitation 05/30/2014   Acute on chronic combined systolic (congestive) and diastolic (congestive) heart failure ([place]) 05/19/2014   Vitamin D deficiency 05/15/2013   Encounter for long-term (current) use of other medications 12/14/2008   Sjogren's syndrome ([place]) 12/14/2008   PCP:  [name], [place] Pharmacy:   [id number] [place] [place]Phone: [phone]: [phone] [facility] [address]Phone: [phone]: [phone] [id number] [place] [address]: [phone]: [phone]   Social Determinants of Health (SDOH) Interventions    Readmission Risk Interventions     No data to display

## 2022-07-14 NOTE — Progress Notes (Addendum)
Advanced Heart Failure Rounding Note  PCP-Cardiologist: Dr. Aundra Dubin   Subjective:    Tolerated surgery ok, s/p Lt IMN.   Echo this admit actually shows improvement in LVEF ~45%.   Yesterday midodrine was cut back to 2.5 mg tid and bumex 1 mg restarted.    Complaining of left leg pain when trying to stand. Denies SOB.   Objective:   Weight Range: 47.3 kg Body mass index is 19.7 kg/m.   Vital Signs:   Temp:  [97.8 F (36.6 C)-98.5 F (36.9 C)] 98.5 F (36.9 C) (12/05 0750) Pulse Rate:  [76-95] 76 (12/05 0750) Resp:  [15-19] 15 (12/05 0503) BP: (126-139)/(82-88) 130/85 (12/05 0750) SpO2:  [97 %-100 %] 100 % (12/05 0750) Weight:  [47.3 kg] 47.3 kg (12/05 0500) Last BM Date : 07/12/22  Weight change: Filed Weights   07/10/22 0911 07/12/22 0700 07/14/22 0500  Weight: 43 kg 45.8 kg 47.3 kg    Intake/Output:   Intake/Output Summary (Last 24 hours) at 07/14/2022 0851 Last data filed at 07/14/2022 6948 Gross per 24 hour  Intake 240 ml  Output 1850 ml  Net -1610 ml      Physical Exam   General:  Thin. In bed. . No resp difficulty HEENT: normal Neck: supple. JVP 6-7 . Carotids 2+ bilat; no bruits. No lymphadenopathy or thryomegaly appreciated. Cor: PMI nondisplaced. Regular rate & rhythm. No rubs, gallops or murmurs. Lungs: clear Abdomen: soft, nontender, nondistended. No hepatosplenomegaly. No bruits or masses. Good bowel sounds. Extremities: no cyanosis, clubbing, rash, edema Neuro: alert & orientedx3, cranial nerves grossly intact. moves all 4 extremities w/o difficulty. Affect pleasant  Telemetry  SR  70-90s   EKG    N/A  Labs    CBC Recent Labs    07/12/22 0200 07/13/22 0405  WBC 8.7 8.3  HGB 9.3* 9.6*  HCT 28.6* 29.8*  MCV 91.1 92.8  PLT 228 546   Basic Metabolic Panel Recent Labs    07/13/22 0405 07/14/22 0806  NA 142 139  K 4.5 4.5  CL 117* 112*  CO2 16* 18*  GLUCOSE 152* 89  BUN 56* 44*  CREATININE 3.11* 2.67*  CALCIUM 8.6*  8.7*   Liver Function Tests No results for input(s): "AST", "ALT", "ALKPHOS", "BILITOT", "PROT", "ALBUMIN" in the last 72 hours.  No results for input(s): "LIPASE", "AMYLASE" in the last 72 hours. Cardiac Enzymes No results for input(s): "CKTOTAL", "CKMB", "CKMBINDEX", "TROPONINI" in the last 72 hours.  BNP: BNP (last 3 results) Recent Labs    11/06/21 1241 03/04/22 1629 07/07/22 1515  BNP 190.3* 193.6* 232.0*    ProBNP (last 3 results) No results for input(s): "PROBNP" in the last 8760 hours.   D-Dimer No results for input(s): "DDIMER" in the last 72 hours. Hemoglobin A1C No results for input(s): "HGBA1C" in the last 72 hours. Fasting Lipid Panel No results for input(s): "CHOL", "HDL", "LDLCALC", "TRIG", "CHOLHDL", "LDLDIRECT" in the last 72 hours. Thyroid Function Tests No results for input(s): "TSH", "T4TOTAL", "T3FREE", "THYROIDAB" in the last 72 hours.  Invalid input(s): "FREET3"   Other results:   Imaging    No results found.   Medications:     Scheduled Medications:  amiodarone  200 mg Oral Daily   apixaban  2.5 mg Oral BID   azaTHIOprine  100 mg Oral Daily   bumetanide  1 mg Oral Daily   cholestyramine  4 g Oral Daily   docusate sodium  100 mg Oral BID   ferrous sulfate  325 mg Oral Q breakfast   hydroxychloroquine  200 mg Oral Daily   midodrine  2.5 mg Oral TID WC   multivitamin with minerals  1 tablet Oral Daily   pantoprazole  40 mg Oral BID   pneumococcal 20-valent conjugate vaccine  0.5 mL Intramuscular Tomorrow-1000   polyethylene glycol  17 g Oral BID   predniSONE  5 mg Oral Q breakfast   senna-docusate  1 tablet Oral QHS   Vericiguat  10 mg Oral Daily    Infusions:  sodium chloride     methocarbamol (ROBAXIN) IV      PRN Medications: acetaminophen, alum & mag hydroxide-simeth, HYDROmorphone (DILAUDID) injection, magnesium citrate, melatonin, menthol-cetylpyridinium **OR** phenol, methocarbamol **OR** methocarbamol (ROBAXIN) IV,  ondansetron **OR** ondansetron (ZOFRAN) IV, oxyCODONE, oxyCODONE, polyvinyl alcohol, prochlorperazine, sorbitol    Patient Profile   76 y/o female w/ 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 Ortho has seen, planning possible IMN tomorrow. AHF team asked to see for CV risk stratification/ surgical clearance.   Assessment/Plan   1. Lt Hip Fx: 2/2 mechanical fall - s/p Lt IMN  - post op management per ortho - will need CIR  2. ABLA: EBL from surgery>>Hgb dropped 7.6  - s/p transfusion  - No CBC today.  3. Chronic systolic CHF: Nonischemic cardiomyopathy.  This has been known since 2015, cath in 2015 showed no coronary disease and cardiac MRI in 2015 showed no LGE.  Cause is uncertain, familial cardiomyopathy is a concern given nonischemic cardiomyopathy in her mother.  Cannot rule out remote viral myocarditis. Poor appetite and severe HF limitation on CPX in 3/22 is concerning, as is elevated creatinine (suspect cardiorenal syndrome).  Low BP and elevated creatinine have limited her cardiac med regimen. RHC (1/22) showed low filling pressures and actually a relatively preserved cardiac index of 2.22.  PYP scan in 4/22 was equivocal, PYP scan repeated in 3/23 and likely negative.  Cardiac MRI in 10/22 showed mild LV dilation with EF 23%, normal RV size with RVEF 37%, ECV 34%, small area of subendocardial LGE in the mid inferolateral wall and small area of full thickness LGE in the basal inferior wall.   This was not suggestive of cardiac amyloidosis but possibly could be consistent with coronary embolism (though no LV thrombus visualized) versus prior myocarditis versus sarcoidosis. She does not appear volume overloaded on exam.  NYHA class IIIb chronically. She is quite frail. GDMT limited by renal dysfunction and hypotension. Echo repeated this admit and  EF improved ~45%, RV normal  - Continue midodrine 2.5 mg tid.  -Volume status stable. Continue bumetanide 1 mg daily . Renal function stable.  - Continue Verquvo 10 mg daily.  - Off dapagliflozin with rise in creatinine.   - Off spironolactone with elevated creatinine and soft BP.   - Though RHC in 1/22 was not markedly abnormal, CPX in 3/22 showed severe functional limitation due to HF and BP also runs quite low.  Concerned she is nearing end-stage.  She is frail with CKD stage 4, I do not think that she would be a candidate for LVAD.  - With renal failure, CHF, and atrial arrhythmias, we have assessed for cardiac amyloidosis.  PYP scan was equivocal in 4/22 for transthyretin cardiac amyloidosis.  It was repeated in 3/23 and likely negative.  [employer] gene testing for TTR mutations was negative. The cardiac  MRI was not consistent with amyloidosis.  She has MGUS with monoclonal IgG paraprotein, but based on cMRI and slow progression, think AL amyloidosis is unlikely.   - EF now out of range for ICD  4. AKI on CKD: Stage 4.  She is followed by Dr. Royce Macadamia. - B/l SCr ~ 3.3>>bumped to 3.8 this admit  - improved to 2.67  today - monitor w/ restart of PO diuretics  5. Rheumatoid arthritis: No history of lung involvement. She has been on a low dose of prednisone chronically.  6. Atrial fibrillation/flutter: S/p TEE-DCCV in 4/22.   - In SR - Continue amiodarone 200 mg daily.  - Continue Eliquis 2.5 mg bid ( low dose due to elevated creatinine, low weight).  7. 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    Disposition pending.  Length of Stay: [place], NP  07/14/2022, 8:51 AM  Advanced Heart Failure Team Pager [phone] (M-F; 7a - 5p)  Please contact [facility] for night-coverage after hours (5p -7a ) and weekends on [email or web address]  Agree with the above NP note.   Creatinine lower at 2.67.  Can continue bumetanide 1 mg daily.  No changes from my perspective.   Loralie Champagne 07/14/2022

## 2022-07-14 NOTE — NC FL2 (Deleted)
[place] LEVEL OF CARE FORM     IDENTIFICATION  Patient Name: Tracy Nelson Birthdate: 1946-04-09 Sex: female Admission Date (Current Location): 07/08/2022  [facility] and [place] Number:  Herbalist and Address:  [facility]. [facility], [place] [address], [place], [place] [address]      Provider Number: [id number]  Attending Physician Name and Address:  Elmarie Shiley, MD  Relative Name and Phone Number:  [facility] Daughter [phone]    Current Level of Care: Hospital Recommended Level of Care: [place] Prior Approval Number:    Date Approved/Denied:   PASRR Number: [id number]  Discharge Plan: SNF    Current Diagnoses: Patient Active Problem List   Diagnosis Date Noted   Chronic systolic CHF (congestive heart failure) ([place]) 07/13/2022   CKD (chronic kidney disease), stage IV ([place]) 07/09/2022   Closed left hip fracture, initial encounter ([place]) 07/08/2022   Corneal melt, 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   Monoclonal gammopathy 03/10/2021   Hypotension 10/22/2020   Atypical chest pain 10/22/2020   CKD (chronic kidney disease), stage III ([place]) 10/22/2020   Nausea & vomiting 10/22/2020   Physical deconditioning 10/22/2020   Atrial flutter ([place]) 10/18/2020   Arthritis 07/15/2020   GERD (gastroesophageal reflux disease) 07/15/2020   Colitis 07/15/2020   Non-ischemic cardiomyopathy ([place]) 07/15/2020   HFrEF (heart failure with reduced ejection fraction) ([place]) 07/15/2020   HTN (hypertension) 07/15/2020   Osteoporosis 07/15/2020   Palpitations 07/15/2020   Acute gout of left shoulder 07/10/2020   Rheumatoid arthritis involving multiple sites with positive rheumatoid factor ([place]) 10/28/2015   Mitral regurgitation 05/30/2014   Acute on chronic combined systolic (congestive) and diastolic (congestive) heart failure ([place]) 05/19/2014   Vitamin D deficiency 05/15/2013   Encounter for long-term (current) use of other  medications 12/14/2008   Sjogren's syndrome ([place]) 12/14/2008    Orientation RESPIRATION BLADDER Height & Weight     Self, Time, Situation, Place  Normal Continent Weight: 104 lb 4.4 oz (47.3 kg) Height:  '5\' 1"'$  (154.9 cm)  BEHAVIORAL SYMPTOMS/MOOD NEUROLOGICAL BOWEL NUTRITION STATUS      Continent Diet (see discharge summary)  AMBULATORY STATUS COMMUNICATION OF NEEDS Skin   Extensive Assist Verbally Surgical wounds                       Personal Care Assistance Level of Assistance  Bathing, Feeding, Dressing, Total care Bathing Assistance: Maximum assistance Feeding assistance: Independent Dressing Assistance: Maximum assistance Total Care Assistance: Maximum assistance   Functional Limitations Info  Sight, Hearing, Speech Sight Info: Adequate Hearing Info: Adequate Speech Info: Adequate    SPECIAL CARE FACTORS FREQUENCY  PT (By licensed PT), OT (By licensed OT)     PT Frequency: 5x week OT Frequency: 5x week            Contractures Contractures Info: Not present    Additional Factors Info  Code Status, Allergies Code Status Info: full Allergies Info: Baclofen, Penicillin G, Penicillins           Current Medications (07/14/2022):  This is the current hospital active medication list Current Facility-Administered Medications  Medication Dose Route Frequency Provider Last Rate Last Admin   0.9 %  sodium chloride infusion   Intravenous Continuous Regalado, Belkys A, MD       acetaminophen (TYLENOL) tablet 325-650 mg  325-650 mg Oral Q6H PRN Leandrew Koyanagi, MD   650 mg at 07/13/22 0627   alum & mag hydroxide-simeth (MAALOX/MYLANTA) 200-200-20  MG/5ML suspension 30 mL  30 mL Oral Q4H PRN Leandrew Koyanagi, MD       amiodarone (PACERONE) tablet 200 mg  200 mg Oral Daily Leandrew Koyanagi, MD   200 mg at 07/14/22 0851   apixaban (ELIQUIS) tablet 2.5 mg  2.5 mg Oral BID Wendee Beavers, RPH   2.5 mg at 07/14/22 9528   azaTHIOprine (IMURAN) tablet 100 mg  100 mg Oral Daily Leandrew Koyanagi, MD   100 mg at 07/13/22 4132   bumetanide (BUMEX) tablet 1 mg  1 mg Oral Daily [name], MD       cholestyramine Lucrezia Starch) packet 4 g  4 g Oral Daily Leandrew Koyanagi, MD   4 g at 07/12/22 1159   docusate sodium (COLACE) capsule 100 mg  100 mg Oral BID Leandrew Koyanagi, MD   100 mg at 07/14/22 4401   ferrous sulfate tablet 325 mg  325 mg Oral Q breakfast Regalado, Belkys A, MD   325 mg at 07/14/22 0851   HYDROmorphone (DILAUDID) injection 0.5-1 mg  0.5-1 mg Intravenous Q4H PRN Leandrew Koyanagi, MD   1 mg at 07/10/22 2222   hydroxychloroquine (PLAQUENIL) tablet 200 mg  200 mg Oral Daily Leandrew Koyanagi, MD   200 mg at 07/14/22 0272   magnesium citrate solution 1 Bottle  1 Bottle Oral Once PRN Leandrew Koyanagi, MD       melatonin tablet 5 mg  5 mg Oral QHS PRN Leandrew Koyanagi, MD       menthol-cetylpyridinium (CEPACOL) lozenge 3 mg  1 lozenge Oral PRN Leandrew Koyanagi, MD       Or   phenol (CHLORASEPTIC) mouth spray 1 spray  1 spray Mouth/Throat PRN Leandrew Koyanagi, MD       methocarbamol (ROBAXIN) tablet 500 mg  500 mg Oral Q6H PRN Leandrew Koyanagi, MD       Or   methocarbamol (ROBAXIN) 500 mg in dextrose 5 % 50 mL IVPB  500 mg Intravenous Q6H PRN Leandrew Koyanagi, MD       midodrine (PROAMATINE) tablet 2.5 mg  2.5 mg Oral TID WC [name], MD   2.5 mg at 07/14/22 0850   multivitamin with minerals tablet 1 tablet  1 tablet Oral Daily Leandrew Koyanagi, MD   1 tablet at 07/14/22 0851   ondansetron (ZOFRAN) tablet 4 mg  4 mg Oral Q6H PRN Leandrew Koyanagi, MD   4 mg at 07/11/22 1205   Or   ondansetron (ZOFRAN) injection 4 mg  4 mg Intravenous Q6H PRN Leandrew Koyanagi, MD       oxyCODONE (Oxy IR/ROXICODONE) immediate release tablet 10-15 mg  10-15 mg Oral Q4H PRN Leandrew Koyanagi, MD   10 mg at 07/11/22 5366   oxyCODONE (Oxy IR/ROXICODONE) immediate release tablet 5-10 mg  5-10 mg Oral Q4H PRN Leandrew Koyanagi, MD   5 mg at 07/11/22 1804   pantoprazole (PROTONIX) EC tablet 40 mg  40 mg Oral BID Dimple Nanas,  RPH   40 mg at 07/14/22 0850   pneumococcal 20-valent conjugate vaccine (PREVNAR 20) injection 0.5 mL  0.5 mL Intramuscular Tomorrow-1000 Regalado, Belkys A, MD       polyethylene glycol (MIRALAX / GLYCOLAX) packet 17 g  17 g Oral BID Regalado, Belkys A, MD   17 g at 07/14/22 0850   polyvinyl alcohol (LIQUIFILM TEARS) 1.4 % ophthalmic solution 1 drop  1  drop Both Eyes PRN Regalado, Belkys A, MD       predniSONE (DELTASONE) tablet 5 mg  5 mg Oral Q breakfast Leandrew Koyanagi, MD   5 mg at 07/14/22 0851   prochlorperazine (COMPAZINE) injection 5 mg  5 mg Intravenous Q6H PRN Leandrew Koyanagi, MD   5 mg at 07/10/22 1438   senna-docusate (Senokot-S) tablet 1 tablet  1 tablet Oral QHS Leandrew Koyanagi, MD   1 tablet at 07/11/22 2206   sorbitol 70 % solution 30 mL  30 mL Oral Daily PRN Leandrew Koyanagi, MD       Vericiguat TABS 10 mg  10 mg Oral Daily Leandrew Koyanagi, MD   10 mg at 07/14/22 3013     Discharge Medications: Please see discharge summary for a list of discharge medications.  Relevant Imaging Results:  Relevant Lab Results:   Additional Information SSN: [id number], Pt is HD-not sure what her current HD outpt center is yet. Pt is not vaccinated for covid.  Joanne Chars, LCSW

## 2022-07-14 NOTE — Progress Notes (Signed)
PROGRESS NOTE    Tracy Nelson  KWI:[id number] DOB: Aug 05, 1946 DOA: 07/08/2022 PCP: [name], [name]   Brief Narrative: 76 year old with past medical history significant for systolic heart failure ejection fraction less than 20%, nonischemic cardiomyopathy, rheumatoid arthritis on immunosuppressants, GERD, CKD stage IV,MGUS, presents to the ED for after she had a mechanical fall, she landed on her left hip.  She was found to have a left intertrochanteric fracture.  Orthopedic Dr. Erlinda Hong was consulted plan is for surgery on 12/01 after Eliquis washout.   Patient underwent treatment of intertrochanteric fracture with intramedullary implant by Dr. Rigoberto Noel on 12/1.  Postop she developed AKI on CKD.  Received IV fluids.  She is known to have nonischemic cardiomyopathy with prior ejection fraction  less than 20%.  Repeated echo during this hospitalization showed ejection fraction 45%.  Heart failure team has been following.  Patient will require rehab at discharge.   Assessment & Plan:   Principal Problem:   Closed left hip fracture, initial encounter ([place]) Active Problems:   Non-ischemic cardiomyopathy (HCC)   HFrEF (heart failure with reduced ejection fraction) (HCC)   HTN (hypertension)   Atrial flutter (HCC)   CKD (chronic kidney disease), stage IV (HCC)   Chronic systolic CHF (congestive heart failure) ([place])  1-Closed Left Hip Fracture:  -Orthopedic consulted, cardiology consulted for pre op evaluation. Patient is high risk for procedure.  -Underwent treatment of intertrochanteric fracture with intramedullary implant with Dr. Erlinda Hong 12/1. -PT recommending skilled nursing facility.  Social worker consulted -Bowel regimen.  Had a bowel movement -On eliquis.  -She will need rehab.   2-CKD stage IV, acute on chronic.  Metabolic acidosis.  Cr baseline 2.4  Cr 3.6 Renal US: Mild fullness of the left renal collecting system without overt hydronephrosis. Normal echotexture. Nephrology  consulted.  Appreciate nephrology assistance.  Patient received IV fluids. AKI in the setting of hypotension, ATN.  Bumex resume 12/04. Renal function today improved.  Monitor renal function on Bumex.   3-Heart failure with reduced ejection fraction, ejection fraction less than 20%, nonischemic cardiomyopathy, EF now 45 % Follow-up with Dr. Aundra Dubin Cardiology consulted for preop assessment, patient felt to be high risk for procedure Continue with Verquvo.  EF improved to 45 % by ECHO Cardiology resume Bumex 1 mg daily. Monitor renal function.  4-Paroxysmal A-fib: Eliquis resume 12/03  5-Rheumatoid arthritis: Present immunosuppressant medication.  6-GERD: Continue with PPI  7-Hypertension: Continue with Midodrine.   8-Dysphagia; Report difficulty swallowing solid and liquids for months. Continue with   PPI BID. Dysphagia improved. Tolerating diet. She would like to hold on doing esophagogram.   Acute blood loss anemia;  S/P one unit PRBC Hemoglobin stable at 9  Estimated body mass index is 19.7 kg/m as calculated from the following:   Height as of this encounter: '5\' 1"'$  (1.549 m).   Weight as of this encounter: 47.3 kg.   DVT prophylaxis: SCD Code Status: Full code Family Communication: daughter over phone Disposition Plan:  Status is: Inpatient Remains inpatient appropriate because: management of hip fracture.     Consultants:  Cardiology Dr Erlinda Hong with Ortho  Procedures:    Antimicrobials:    Subjective: She is feeling ok, she has been able to tolerates diet.  Denies dyspnea.   Objective: Vitals:   07/13/22 2017 07/14/22 0500 07/14/22 0503 07/14/22 0750  BP: 137/82  126/85 130/85  Pulse:    76  Resp: 19  15   Temp: 98 F (36.7 C)  98.2 F (36.8  C) 98.5 F (36.9 C)  TempSrc: Oral  Oral Oral  SpO2: 100%  97% 100%  Weight:  47.3 kg    Height:        Intake/Output Summary (Last 24 hours) at 07/14/2022 1426 Last data filed at 07/14/2022 3818 Gross per 24  hour  Intake 240 ml  Output 1850 ml  Net -1610 ml    Filed Weights   07/10/22 0911 07/12/22 0700 07/14/22 0500  Weight: 43 kg 45.8 kg 47.3 kg    Examination:  General exam: NAD Respiratory system: CTA Cardiovascular system: S 1, S 2  RRR Gastrointestinal system: BS present, soft, nt Central nervous system:  non focal.  Extremities: LE with dressing at surgical site, hip    Data Reviewed: I have personally reviewed following labs and imaging studies  CBC: Recent Labs  Lab 07/08/22 2015 07/09/22 0632 07/10/22 0809 07/11/22 0248 07/12/22 0200 07/13/22 0405  WBC 10.4 11.0* 10.4 9.2 8.7 8.3  NEUTROABS 9.1*  --   --   --   --   --   HGB 10.9* 9.8* 8.9* 7.6* 9.3* 9.6*  HCT 36.0 31.2* 29.3* 24.6* 28.6* 29.8*  MCV 95.7 94.0 95.1 95.3 91.1 92.8  PLT 246 228 244 224 228 299    Basic Metabolic Panel: Recent Labs  Lab 07/09/22 0632 07/10/22 0809 07/11/22 0248 07/12/22 0200 07/13/22 0405 07/14/22 0806  NA 140 142 142 144 142 139  K 4.7 5.1 5.1 5.0 4.5 4.5  CL 112* 116* 120* 118* 117* 112*  CO2 18* 17* 16* 16* 16* 18*  GLUCOSE 129* 118* 127* 90 152* 89  BUN 65* 70* 65* 66* 56* 44*  CREATININE 3.36* 3.75* 3.78* 3.67* 3.11* 2.67*  CALCIUM 9.0 9.4 9.1 9.2 8.6* 8.7*  MG 2.1  --   --   --   --   --   PHOS 5.1*  --   --   --   --   --     GFR: Estimated Creatinine Clearance: 13.4 mL/min (A) (by C-G formula based on SCr of 2.67 mg/dL (H)). Liver Function Tests: Recent Labs  Lab 07/07/22 1515 07/09/22 0632  AST 30  --   ALT 32  --   ALKPHOS 56  --   BILITOT 0.6  --   PROT 6.3*  --   ALBUMIN 3.2* 3.1*    No results for input(s): "LIPASE", "AMYLASE" in the last 168 hours. No results for input(s): "AMMONIA" in the last 168 hours. Coagulation Profile: No results for input(s): "INR", "PROTIME" in the last 168 hours. Cardiac Enzymes: No results for input(s): "CKTOTAL", "CKMB", "CKMBINDEX", "TROPONINI" in the last 168 hours. BNP (last 3 results) No results for  input(s): "PROBNP" in the last 8760 hours. HbA1C: No results for input(s): "HGBA1C" in the last 72 hours. CBG: Recent Labs  Lab 07/08/22 2357 07/10/22 2041  GLUCAP 173* 144*    Lipid Profile: No results for input(s): "CHOL", "HDL", "LDLCALC", "TRIG", "CHOLHDL", "LDLDIRECT" in the last 72 hours. Thyroid Function Tests: No results for input(s): "TSH", "T4TOTAL", "FREET4", "T3FREE", "THYROIDAB" in the last 72 hours.  Anemia Panel: No results for input(s): "VITAMINB12", "FOLATE", "FERRITIN", "TIBC", "IRON", "RETICCTPCT" in the last 72 hours. Sepsis Labs: No results for input(s): "PROCALCITON", "LATICACIDVEN" in the last 168 hours.  Recent Results (from the past 240 hour(s))  Surgical pcr screen     Status: None   Collection Time: 07/10/22  5:28 AM   Specimen: Nasal Mucosa; Nasal Swab  Result Value Ref Range Status  MRSA, PCR NEGATIVE NEGATIVE Final   Staphylococcus aureus NEGATIVE NEGATIVE Final    Comment: (NOTE) The Xpert SA Assay (FDA approved for NASAL specimens in patients 53 years of age and older), is one component of a comprehensive surveillance program. It is not intended to diagnose infection nor to guide or monitor treatment. Performed at [facility], [place] [address] [place], [place] [address]          Radiology Studies: No results found.      Scheduled Meds:  amiodarone  200 mg Oral Daily   apixaban  2.5 mg Oral BID   azaTHIOprine  100 mg Oral Daily   bumetanide  1 mg Oral Daily   cholestyramine  4 g Oral Daily   docusate sodium  100 mg Oral BID   ferrous sulfate  325 mg Oral Q breakfast   hydroxychloroquine  200 mg Oral Daily   midodrine  2.5 mg Oral TID WC   multivitamin with minerals  1 tablet Oral Daily   pantoprazole  40 mg Oral BID   pneumococcal 20-valent conjugate vaccine  0.5 mL Intramuscular Tomorrow-1000   polyethylene glycol  17 g Oral BID   predniSONE  5 mg Oral Q breakfast   senna-docusate  1 tablet Oral QHS   Vericiguat   10 mg Oral Daily   Continuous Infusions:  sodium chloride     methocarbamol (ROBAXIN) IV       LOS: 6 days    Time spent: 35 minutes    Toben Acuna A Ailyne Pawley, MD [facility]   If 7PM-7AM, please contact night-coverage [email or web address]  07/14/2022, 2:26 PM

## 2022-07-14 NOTE — Progress Notes (Signed)
Occupational Therapy Treatment Patient Details Name: Tracy Nelson MRN: [id number] DOB: October 19, 1945 Today's Date: 07/14/2022   History of present illness Pt is a 76 y.o. female admitted s/p fall and left intertrochanteric fracture. Underwent IM nailing on 07/10/22.  PMH significant for HFrEF<20%, nonischemic cardiomyopathy, rheumatoid arthritis on immunosuppressants, GERD, CKD IV.,   OT comments  Pt in bed upon therapy arrival and agreeable to participate in OT treatment session. Nursing informed therapist that pt was requesting to go to the bathroom. Session focused on bed mobility, functional transfers, sitting and standing balance, and safety awareness during RW management. Pt demonstrates improvement with sit to stand transition and functional transfers. Presents with continued decreased standing tolerance in left leg as she was unable to remaining standing long enough to perform a controlled sit when transitioning from [facility] to recliner. Pt will continue to follow patient acutely.    Recommendations for follow up therapy are one component of a multi-disciplinary discharge planning process, led by the attending physician.  Recommendations may be updated based on patient status, additional functional criteria and insurance authorization.    Follow Up Recommendations  Skilled nursing-short term rehab (<3 hours/day)     Assistance Recommended at Discharge Intermittent Supervision/Assistance  Patient can return home with the following  Assistance with cooking/housework;Help with stairs or ramp for entrance;Assist for transportation;A little help with walking and/or transfers;A little help with bathing/dressing/bathroom   Equipment Recommendations  Other (comment) (defer to next venue of care)       Precautions / Restrictions Precautions Precautions: Fall Restrictions Weight Bearing Restrictions: Yes LLE Weight Bearing: Weight bearing as tolerated       Mobility Bed Mobility Overal  bed mobility: Needs Assistance Bed Mobility: Supine to Sit     Supine to sit: Min assist, HOB elevated     General bed mobility comments: increased time needed to complete. Able to transition to seated on EOB with HOB elevated.    Transfers Overall transfer level: Needs assistance Equipment used: Rolling walker (2 wheels) Transfers: Bed to chair/wheelchair/BSC, Sit to/from Stand Sit to Stand: Min assist, From elevated surface     Step pivot transfers: Min assist     General transfer comment: Pt demonstrated lateral side step using RW from bed to [facility] then BSC to recliner using RW. When completing step pvt transfer from [facility] to recliner, pt's left leg gave out and she was unable to demonstrate a controlled sit.     Balance Overall balance assessment: Needs assistance Sitting-balance support: Feet supported, Bilateral upper extremity supported Sitting balance-Leahy Scale: Fair Sitting balance - Comments: seated EOB   Standing balance support: Bilateral upper extremity supported, Reliant on assistive device for balance Standing balance-Leahy Scale: Poor Standing balance comment: Required Moderate VC to look up, either out window or at TV to achieve an upright standing posture and to improve standing balance.       ADL either performed or assessed with clinical judgement   ADL Overall ADL's : Needs assistance/impaired     Grooming: Wash/dry face;Set up;Sitting   Upper Body Bathing: Set up;Sitting   Lower Body Bathing: Maximal assistance;Sit to/from stand   Upper Body Dressing : Set up;Sitting       Toilet Transfer: Minimal assistance;Stand-pivot;BSC/3in1;Rolling walker (2 wheels)   Toileting- Clothing Manipulation and Hygiene: Total assistance;Sit to/from stand                Cognition Arousal/Alertness: Awake/alert Behavior During Therapy: WFL for tasks assessed/performed Overall Cognitive Status: Within Functional Limits for tasks  assessed                        Pertinent Vitals/ Pain       Pain Assessment Pain Assessment: Faces Faces Pain Scale: Hurts a little bit Pain Location: left hip Pain Descriptors / Indicators: Sore, Guarding, Grimacing Pain Intervention(s): Monitored during session         Frequency  Min 2X/week        Progress Toward Goals  OT Goals(current goals can now be found in the care plan section)  Progress towards OT goals: Progressing toward goals     Plan Discharge plan remains appropriate;Frequency remains appropriate       AM-PAC OT "6 Clicks" Daily Activity     Outcome Measure   Help from another person eating meals?: None Help from another person taking care of personal grooming?: A Little Help from another person toileting, which includes using toliet, bedpan, or urinal?: Total Help from another person bathing (including washing, rinsing, drying)?: A Lot Help from another person to put on and taking off regular upper body clothing?: A Little Help from another person to put on and taking off regular lower body clothing?: Total 6 Click Score: 14    End of Session Equipment Utilized During Treatment: Gait belt;Rolling walker (2 wheels)  OT Visit Diagnosis: Unsteadiness on feet (R26.81);Other abnormalities of gait and mobility (R26.89);Muscle weakness (generalized) (M62.81);Pain Pain - Right/Left: Left Pain - part of body: Leg   Activity Tolerance Patient tolerated treatment well   Patient Left in chair;with call bell/phone within reach;with chair alarm set           Time: 6834-1962 OT Time Calculation (min): 51 min  Charges: OT General Charges $OT Visit: 1 Visit OT Treatments $Self Care/Home Management : 38-52 mins  Ailene Ravel, OTR/L,CBIS  Supplemental OT - [facility] and [facility]   Matricia Begnaud, Clarene Duke 07/14/2022, 12:29 PM

## 2022-07-14 NOTE — Progress Notes (Signed)
   07/14/22 1430  Clinical Encounter Type  Visited With Patient  Visit Type Follow-up;Other (Comment) (Advanced Directive)  Referral From Nurse  Consult/Referral To Chaplain   Chaplain returned to visit with patient and follow up on the advanced directive conversation.  We filled out the power of attorney and discussed the living will. The patient, Tracy Nelson wished to think about the questions. I will continue to monitor and if she is ready to complete we will work on it.   Danice Goltz [facility]  [phone]

## 2022-07-14 NOTE — NC FL2 (Signed)
[place] LEVEL OF CARE FORM     IDENTIFICATION  Patient Name: Tracy Nelson Birthdate: 14-Jan-1946 Sex: female Admission Date (Current Location): 07/08/2022  [facility] and [place] Number:  Herbalist and Address:  The . [facility], [place] [address], [place], [place] [address]      Provider Number: [id number]  Attending Physician Name and Address:  Elmarie Shiley, MD  Relative Name and Phone Number:  [facility] Daughter [phone]    Current Level of Care: Hospital Recommended Level of Care: [facility] Prior Approval Number:    Date Approved/Denied:   PASRR Number: [id number]  Discharge Plan: SNF    Current Diagnoses: Patient Active Problem List   Diagnosis Date Noted   Chronic systolic CHF (congestive heart failure) ([place]) 07/13/2022   CKD (chronic kidney disease), stage IV ([place]) 07/09/2022   Closed left hip fracture, initial encounter ([place]) 07/08/2022   Corneal melt, 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   Monoclonal gammopathy 03/10/2021   Hypotension 10/22/2020   Atypical chest pain 10/22/2020   CKD (chronic kidney disease), stage III ([place]) 10/22/2020   Nausea & vomiting 10/22/2020   Physical deconditioning 10/22/2020   Atrial flutter ([place]) 10/18/2020   Arthritis 07/15/2020   GERD (gastroesophageal reflux disease) 07/15/2020   Colitis 07/15/2020   Non-ischemic cardiomyopathy ([place]) 07/15/2020   HFrEF (heart failure with reduced ejection fraction) ([place]) 07/15/2020   HTN (hypertension) 07/15/2020   Osteoporosis 07/15/2020   Palpitations 07/15/2020   Acute gout of left shoulder 07/10/2020   Rheumatoid arthritis involving multiple sites with positive rheumatoid factor ([place]) 10/28/2015   Mitral regurgitation 05/30/2014   Acute on chronic combined systolic (congestive) and diastolic (congestive) heart failure ([place]) 05/19/2014   Vitamin D deficiency 05/15/2013   Encounter for long-term (current) use of other  medications 12/14/2008   Sjogren's syndrome ([place]) 12/14/2008    Orientation RESPIRATION BLADDER Height & Weight     Self, Time, Situation, Place  Normal Continent Weight: 104 lb 4.4 oz (47.3 kg) Height:  '5\' 1"'$  (154.9 cm)  BEHAVIORAL SYMPTOMS/MOOD NEUROLOGICAL BOWEL NUTRITION STATUS      Continent Diet (see discharge summary)  AMBULATORY STATUS COMMUNICATION OF NEEDS Skin   Extensive Assist Verbally Surgical wounds                       Personal Care Assistance Level of Assistance  Bathing, Feeding, Dressing, Total care Bathing Assistance: Maximum assistance Feeding assistance: Independent Dressing Assistance: Maximum assistance Total Care Assistance: Maximum assistance   Functional Limitations Info  Sight, Hearing, Speech Sight Info: Adequate Hearing Info: Adequate Speech Info: Adequate    SPECIAL CARE FACTORS FREQUENCY  PT (By licensed PT), OT (By licensed OT)     PT Frequency: 5x week OT Frequency: 5x week            Contractures Contractures Info: Not present    Additional Factors Info  Code Status, Allergies Code Status Info: full Allergies Info: Baclofen, Penicillin G, Penicillins           Current Medications (07/14/2022):  This is the current hospital active medication list Current Facility-Administered Medications  Medication Dose Route Frequency Provider Last Rate Last Admin   0.9 %  sodium chloride infusion   Intravenous Continuous Regalado, Belkys A, MD       acetaminophen (TYLENOL) tablet 325-650 mg  325-650 mg Oral Q6H PRN Leandrew Koyanagi, MD   650 mg at 07/13/22 0627   alum & mag hydroxide-simeth (MAALOX/MYLANTA) 200-200-20  MG/5ML suspension 30 mL  30 mL Oral Q4H PRN Leandrew Koyanagi, MD       amiodarone (PACERONE) tablet 200 mg  200 mg Oral Daily Leandrew Koyanagi, MD   200 mg at 07/14/22 0851   apixaban (ELIQUIS) tablet 2.5 mg  2.5 mg Oral BID Wendee Beavers, RPH   2.5 mg at 07/14/22 2423   azaTHIOprine (IMURAN) tablet 100 mg  100 mg Oral Daily Leandrew Koyanagi, MD   100 mg at 07/13/22 5361   bumetanide (BUMEX) tablet 1 mg  1 mg Oral Daily [name], MD       cholestyramine Lucrezia Starch) packet 4 g  4 g Oral Daily Leandrew Koyanagi, MD   4 g at 07/12/22 1159   docusate sodium (COLACE) capsule 100 mg  100 mg Oral BID Leandrew Koyanagi, MD   100 mg at 07/14/22 4431   ferrous sulfate tablet 325 mg  325 mg Oral Q breakfast Regalado, Belkys A, MD   325 mg at 07/14/22 0851   HYDROmorphone (DILAUDID) injection 0.5-1 mg  0.5-1 mg Intravenous Q4H PRN Leandrew Koyanagi, MD   1 mg at 07/10/22 2222   hydroxychloroquine (PLAQUENIL) tablet 200 mg  200 mg Oral Daily Leandrew Koyanagi, MD   200 mg at 07/14/22 5400   magnesium citrate solution 1 Bottle  1 Bottle Oral Once PRN Leandrew Koyanagi, MD       melatonin tablet 5 mg  5 mg Oral QHS PRN Leandrew Koyanagi, MD       menthol-cetylpyridinium (CEPACOL) lozenge 3 mg  1 lozenge Oral PRN Leandrew Koyanagi, MD       Or   phenol (CHLORASEPTIC) mouth spray 1 spray  1 spray Mouth/Throat PRN Leandrew Koyanagi, MD       methocarbamol (ROBAXIN) tablet 500 mg  500 mg Oral Q6H PRN Leandrew Koyanagi, MD       Or   methocarbamol (ROBAXIN) 500 mg in dextrose 5 % 50 mL IVPB  500 mg Intravenous Q6H PRN Leandrew Koyanagi, MD       midodrine (PROAMATINE) tablet 2.5 mg  2.5 mg Oral TID WC [name], MD   2.5 mg at 07/14/22 0850   multivitamin with minerals tablet 1 tablet  1 tablet Oral Daily Leandrew Koyanagi, MD   1 tablet at 07/14/22 0851   ondansetron (ZOFRAN) tablet 4 mg  4 mg Oral Q6H PRN Leandrew Koyanagi, MD   4 mg at 07/11/22 1205   Or   ondansetron (ZOFRAN) injection 4 mg  4 mg Intravenous Q6H PRN Leandrew Koyanagi, MD       oxyCODONE (Oxy IR/ROXICODONE) immediate release tablet 10-15 mg  10-15 mg Oral Q4H PRN Leandrew Koyanagi, MD   10 mg at 07/11/22 8676   oxyCODONE (Oxy IR/ROXICODONE) immediate release tablet 5-10 mg  5-10 mg Oral Q4H PRN Leandrew Koyanagi, MD   5 mg at 07/11/22 1804   pantoprazole (PROTONIX) EC tablet 40 mg  40 mg Oral BID Dimple Nanas,  RPH   40 mg at 07/14/22 0850   pneumococcal 20-valent conjugate vaccine (PREVNAR 20) injection 0.5 mL  0.5 mL Intramuscular Tomorrow-1000 Regalado, Belkys A, MD       polyethylene glycol (MIRALAX / GLYCOLAX) packet 17 g  17 g Oral BID Regalado, Belkys A, MD   17 g at 07/14/22 0850   polyvinyl alcohol (LIQUIFILM TEARS) 1.4 % ophthalmic solution 1 drop  1  drop Both Eyes PRN Regalado, Belkys A, MD       predniSONE (DELTASONE) tablet 5 mg  5 mg Oral Q breakfast Leandrew Koyanagi, MD   5 mg at 07/14/22 0851   prochlorperazine (COMPAZINE) injection 5 mg  5 mg Intravenous Q6H PRN Leandrew Koyanagi, MD   5 mg at 07/10/22 1438   senna-docusate (Senokot-S) tablet 1 tablet  1 tablet Oral QHS Leandrew Koyanagi, MD   1 tablet at 07/11/22 2206   sorbitol 70 % solution 30 mL  30 mL Oral Daily PRN Leandrew Koyanagi, MD       Vericiguat TABS 10 mg  10 mg Oral Daily Leandrew Koyanagi, MD   10 mg at 07/14/22 7026     Discharge Medications: Please see discharge summary for a list of discharge medications.  Relevant Imaging Results:  Relevant Lab Results:   Additional Information SSN: [id number]. Pt is not vaccinated for covid.  Joanne Chars, LCSW

## 2022-07-14 NOTE — Progress Notes (Signed)
I was made aware of concerns for episodes of NSVT while working with mobility this afternoon. Telemetry reviewed. Episodes binned as VT appear to be consistent with artifact. No clear VT identified. Continue to monitor.

## 2022-07-14 NOTE — Progress Notes (Signed)
[facility] Progress Note  Subjective: hemodynamically stable,  UOP 1850-   BUN and crt both down again  Vitals:   07/13/22 2017 07/14/22 0500 07/14/22 0503 07/14/22 0750  BP: 137/82  126/85 130/85  Pulse:    76  Resp: 19  15   Temp: 98 F (36.7 C)  98.2 F (36.8 C) 98.5 F (36.9 C)  TempSrc: Oral  Oral Oral  SpO2: 100%  97% 100%  Weight:  47.3 kg    Height:        Exam: Gen alert, frail elderly, pleasant-  sitting up in chair-  did eat breakfast  BP's good , on RA No jvd or bruits Chest clear bilat to bases RRR no MRG Abd soft ntnd no mass or ascites +bs Ext no LE  edema Neuro is alert, Ox 3 , nf, deconditioned       Home meds include - amiodarone, imuran 100 qd, bumex 1 mg qd, eliquis, ensure, plaquenil 200 qd, midodrine 5 tid, prilosec, prednisone 5 qam, vericiguat, questran     UA pend    Una, Ucr pending    Renal US - 9.5/ 9.2 cm kidneys, mild fullness of the L collecting system.      CXR 11/29 - clear , no edema    Assessment/ Plan: AKI on CKD4 - b/l creat 2.2- 2.6 from jan - July 2023, eGFR 18- 42m/min. [facility] after a fall w/ L hip fx. Diuretics held and home midodrine continued due to hypotension. Pt went to OR 12/01 for ORIF of hip fx. Creat bumped to 3.7. UA completely bland.  Renal UKoreaw/o sig obstruction.  Exam showed no edema, CXR clear. EF better by echo at 40-45%. Suspected AKI due to dehydration/ATN from low BPs. Home bumex on hold-  please continue to hold.  IVF"s started  but now stopped.  I think is OK since now eating and drinking.  creat  improved as well as UOP.  L hip fx - sp ORIF on 12/01 NICM - prior LVEF < 20%, now is around 45-50%. Improved.  Atrial fib - paroxysmal Chronic hypotension - BP's wnl here RA - on sig immunosuppression Dysphagia - for esophagram on Monday Anemia - Hb dropped, sp 1u prbc's. Hb 9.6 now.   Renal function improved close to baseline-  would hold bumex until  discharge unless begins to show signs of volume overload ( does not appear to)   I will sign off, call with questions-  will make sure she has reasonable renal follow up scheduled     KBedford 07/14/2022, 12:07 PM   Recent Labs  Lab 07/07/22 1515 07/08/22 0807 07/09/22 0632 07/10/22 0809 07/12/22 0200 07/13/22 0405 07/14/22 0806  HGB 10.5*   < > 9.8*   < > 9.3* 9.6*  --   ALBUMIN 3.2*  --  3.1*  --   --   --   --   CALCIUM 9.5   < > 9.0   < > 9.2 8.6* 8.7*  PHOS  --   --  5.1*  --   --   --   --   CREATININE 3.35*   < > 3.36*   < > 3.67* 3.11* 2.67*  K 4.6   < > 4.7   < > 5.0 4.5 4.5   < > = values in this interval not displayed.   No results for input(s): "IRON", "TIBC", "FERRITIN" in the last  168 hours. Inpatient medications:  amiodarone  200 mg Oral Daily   apixaban  2.5 mg Oral BID   azaTHIOprine  100 mg Oral Daily   bumetanide  1 mg Oral Daily   cholestyramine  4 g Oral Daily   docusate sodium  100 mg Oral BID   ferrous sulfate  325 mg Oral Q breakfast   hydroxychloroquine  200 mg Oral Daily   midodrine  2.5 mg Oral TID WC   multivitamin with minerals  1 tablet Oral Daily   pantoprazole  40 mg Oral BID   pneumococcal 20-valent conjugate vaccine  0.5 mL Intramuscular Tomorrow-1000   polyethylene glycol  17 g Oral BID   predniSONE  5 mg Oral Q breakfast   senna-docusate  1 tablet Oral QHS   Vericiguat  10 mg Oral Daily    sodium chloride     methocarbamol (ROBAXIN) IV     acetaminophen, alum & mag hydroxide-simeth, HYDROmorphone (DILAUDID) injection, magnesium citrate, melatonin, menthol-cetylpyridinium **OR** phenol, methocarbamol **OR** methocarbamol (ROBAXIN) IV, ondansetron **OR** ondansetron (ZOFRAN) IV, oxyCODONE, oxyCODONE, polyvinyl alcohol, prochlorperazine, sorbitol

## 2022-07-15 DIAGNOSIS — S72002A Fracture of unspecified part of neck of left femur, initial encounter for closed fracture: Secondary | ICD-10-CM | POA: Diagnosis not present

## 2022-07-15 LAB — CBC
HCT: 24.7 % — ABNORMAL LOW (ref 36.0–46.0)
Hemoglobin: 7.9 g/dL — ABNORMAL LOW (ref 12.0–15.0)
MCH: 29.5 pg (ref 26.0–34.0)
MCHC: 32 g/dL (ref 30.0–36.0)
MCV: 92.2 fL (ref 80.0–100.0)
Platelets: 247 10*3/uL (ref 150–400)
RBC: 2.68 MIL/uL — ABNORMAL LOW (ref 3.87–5.11)
RDW: 17.5 % — ABNORMAL HIGH (ref 11.5–15.5)
WBC: 7.3 10*3/uL (ref 4.0–10.5)
nRBC: 0.4 % — ABNORMAL HIGH (ref 0.0–0.2)

## 2022-07-15 LAB — RENAL FUNCTION PANEL
Albumin: 2 g/dL — ABNORMAL LOW (ref 3.5–5.0)
Anion gap: 11 (ref 5–15)
BUN: 48 mg/dL — ABNORMAL HIGH (ref 8–23)
CO2: 18 mmol/L — ABNORMAL LOW (ref 22–32)
Calcium: 8.8 mg/dL — ABNORMAL LOW (ref 8.9–10.3)
Chloride: 109 mmol/L (ref 98–111)
Creatinine, Ser: 2.78 mg/dL — ABNORMAL HIGH (ref 0.44–1.00)
GFR, Estimated: 17 mL/min — ABNORMAL LOW (ref >60)
Glucose, Bld: 137 mg/dL — ABNORMAL HIGH (ref 70–99)
Phosphorus: 2.2 mg/dL — ABNORMAL LOW (ref 2.5–4.6)
Potassium: 4.4 mmol/L (ref 3.5–5.1)
Sodium: 138 mmol/L (ref 135–145)

## 2022-07-15 MED ORDER — BUMETANIDE 2 MG PO TABS: 1.0000 mg | ORAL_TABLET | Freq: Every day | ORAL | Status: DC

## 2022-07-15 NOTE — Progress Notes (Addendum)
Advanced Heart Failure Rounding Note  PCP-Cardiologist: Dr. Aundra Dubin   Subjective:    No significant dyspnea. Reports some epigastric discomfort which she attributes to gas. Notes severe LLE pain when she attempts to bear weight.  Has not received bumex this am. Nephrology suggested holding diuretics until discharge unless develops volume overload.  Scr stable 2.8.    Objective:   Weight Range: 49.9 kg Body mass index is 20.79 kg/m.   Vital Signs:   Temp:  [98 F (36.7 C)-98.5 F (36.9 C)] 98.5 F (36.9 C) (12/06 0715) Pulse Rate:  [78] 78 (12/05 1506) Resp:  [17-20] 17 (12/06 0715) BP: (92-124)/(69-86) 117/78 (12/06 0715) SpO2:  [97 %-100 %] 99 % (12/06 0715) Weight:  [49.9 kg] 49.9 kg (12/06 0500) Last BM Date : 07/12/22  Weight change: Filed Weights   07/12/22 0700 07/14/22 0500 07/15/22 0500  Weight: 45.8 kg 47.3 kg 49.9 kg    Intake/Output:  No intake or output data in the 24 hours ending 07/15/22 0917     Physical Exam  General:  Thin, elderly female. No distress. HEENT: normal Neck: supple. JVP not elevated. Carotids 2+ bilat; no bruits.  Cor: PMI nondisplaced. Regular rate & rhythm. No rubs, gallops or murmurs. Lungs: clear Abdomen: soft, nontender, nondistended.  Extremities: no cyanosis, clubbing, rash, edema Neuro: alert & orientedx3. Affect pleasant   Telemetry  SR 70s-80s  EKG    N/A  Labs    CBC Recent Labs    07/13/22 0405 07/15/22 0333  WBC 8.3 7.3  HGB 9.6* 7.9*  HCT 29.8* 24.7*  MCV 92.8 92.2  PLT 246 735   Basic Metabolic Panel Recent Labs    07/14/22 0806 07/14/22 0819 07/15/22 0333  NA 139  --  138  K 4.5  --  4.4  CL 112*  --  109  CO2 18*  --  18*  GLUCOSE 89  --  137*  BUN 44*  --  48*  CREATININE 2.67*  --  2.78*  CALCIUM 8.7*  --  8.8*  MG  --  2.0  --   PHOS  --   --  2.2*   Liver Function Tests Recent Labs    07/15/22 0333  ALBUMIN 2.0*    No results for input(s): "LIPASE", "AMYLASE" in  the last 72 hours. Cardiac Enzymes No results for input(s): "CKTOTAL", "CKMB", "CKMBINDEX", "TROPONINI" in the last 72 hours.  BNP: BNP (last 3 results) Recent Labs    11/06/21 1241 03/04/22 1629 07/07/22 1515  BNP 190.3* 193.6* 232.0*    ProBNP (last 3 results) No results for input(s): "PROBNP" in the last 8760 hours.   D-Dimer No results for input(s): "DDIMER" in the last 72 hours. Hemoglobin A1C No results for input(s): "HGBA1C" in the last 72 hours. Fasting Lipid Panel No results for input(s): "CHOL", "HDL", "LDLCALC", "TRIG", "CHOLHDL", "LDLDIRECT" in the last 72 hours. Thyroid Function Tests No results for input(s): "TSH", "T4TOTAL", "T3FREE", "THYROIDAB" in the last 72 hours.  Invalid input(s): "FREET3"   Other results:   Imaging    No results found.   Medications:     Scheduled Medications:  amiodarone  200 mg Oral Daily   apixaban  2.5 mg Oral BID   azaTHIOprine  100 mg Oral Daily   bumetanide  1 mg Oral Daily   cholestyramine  4 g Oral Daily   docusate sodium  100 mg Oral BID   ferrous sulfate  325 mg Oral Q breakfast   hydroxychloroquine  200 mg Oral Daily   midodrine  2.5 mg Oral TID WC   multivitamin with minerals  1 tablet Oral Daily   pantoprazole  40 mg Oral BID   pneumococcal 20-valent conjugate vaccine  0.5 mL Intramuscular Tomorrow-1000   polyethylene glycol  17 g Oral BID   predniSONE  5 mg Oral Q breakfast   senna-docusate  1 tablet Oral QHS   Vericiguat  10 mg Oral Daily    Infusions:  sodium chloride     methocarbamol (ROBAXIN) IV      PRN Medications: acetaminophen, alum & mag hydroxide-simeth, HYDROmorphone (DILAUDID) injection, magnesium citrate, melatonin, menthol-cetylpyridinium **OR** phenol, methocarbamol **OR** methocarbamol (ROBAXIN) IV, ondansetron **OR** ondansetron (ZOFRAN) IV, oxyCODONE, oxyCODONE, polyvinyl alcohol, prochlorperazine, sorbitol    Patient Profile   76 y/o female w/ 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 Ortho has seen, planning possible IMN tomorrow. AHF team asked to see for CV risk stratification/ surgical clearance.   Assessment/Plan   1. Lt Hip Fx: 2/2 mechanical fall - s/p Lt intramedullary implant on 12/01 - post op management per ortho - will need SNF for rehab 2. ABLA: EBL from surgery>>s/p transfusion  - Hgb 7.9 this am - Per primary 3. Chronic systolic CHF: Nonischemic cardiomyopathy.  This has been known since 2015, cath in 2015 showed no coronary disease and cardiac MRI in 2015 showed no LGE.  Cause is uncertain, familial cardiomyopathy is a concern given nonischemic cardiomyopathy in her mother.  Cannot rule out remote viral myocarditis. Poor appetite and severe HF limitation on CPX in 3/22 is concerning, as is elevated creatinine (suspect cardiorenal syndrome).  Low BP and elevated creatinine have limited her cardiac med regimen. RHC (1/22) showed low filling pressures and actually a relatively preserved cardiac index of 2.22.  PYP scan in 4/22 was equivocal, PYP scan repeated in 3/23 and likely negative.  [employer] gene testing for TTR mutations was negative. The cardiac MRI was not consistent with amyloidosis.  She has MGUS with monoclonal IgG paraprotein, but based on cMRI and slow progression, think AL amyloidosis is unlikely.  Cardiac MRI in 10/22 showed mild LV dilation with EF 23%, normal RV size with RVEF 37%, ECV 34%, small area of subendocardial LGE in the mid inferolateral wall and small area of full thickness LGE in the basal inferior wall.   This was not suggestive of cardiac amyloidosis but possibly could be consistent with coronary embolism (though no LV thrombus visualized) versus prior myocarditis versus sarcoidosis.  - She does not appear volume overloaded on exam.  NYHA class IIIb chronically. She is quite  frail. GDMT limited by renal dysfunction and hypotension. Echo repeated this admit and EF improved ~45%, RV normal  - Continue midodrine 2.5 mg tid.  -Volume status stable. On po bumex 1 mg daily, now held Nephrology recommending holding bumex until discharge unless develops volume overload. - Continue Verquvo 10 mg daily.  - Off dapagliflozin with rise in creatinine.   - Off spironolactone with elevated creatinine and soft BP.   - Concerned she is nearing end-stage.  She is frail with CKD stage 4, I do not think that she would be a candidate for LVAD.  - EF now out of range for ICD  4. AKI on CKD: Stage 4.  She is followed by Dr. Royce Macadamia. - B/l SCr ~ 3.3>>bumped to 3.8 this admit  -  improved, stable at 2.8 today - No diuretics for now as above 5. Rheumatoid arthritis: No history of lung involvement. She has been on a low dose of prednisone chronically.  6. Atrial fibrillation/flutter: S/p TEE-DCCV in 4/22.   - In SR - Continue amiodarone 200 mg daily.  - Continue Eliquis 2.5 mg bid ( low dose due to elevated creatinine, low weight).  7. 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    Disposition pending. Will need SNF at discharge.  Length of Stay: 7  [facility], [place], PA-C  07/15/2022, 9:17 AM  Advanced Heart Failure Team Pager [phone] (M-F; 7a - 5p)  Please contact [facility] for night-coverage after hours (5p -7a ) and weekends on [email or web address]  Patient seen with PA, agree with the above note.   Still having trouble with pain in left hip and not moving much. No dyspnea. Creatinine stable at 2.78.   General: NAD, frail Neck: No JVD, no thyromegaly or thyroid nodule.  Lungs: Clear to auscultation bilaterally with normal respiratory effort. CV: Nondisplaced PMI.  Heart regular S1/S2, no S3/S4, no murmur.  No peripheral edema.   Abdomen: Soft, nontender, no hepatosplenomegaly, no distention.  Skin: Intact without lesions or rashes.  Neurologic: Alert and oriented x 3.  Psych:  Normal affect. Extremities: No clubbing or cyanosis.  HEENT: Normal.   Stable from cardiac standpoint.  Will hold bumetanide today at nephrology's recommendation.  Suspect she will need to restart soon, possibly tomorrow, on lower dose bumetanide 1 mg daily.   Loralie Champagne 07/15/2022 10:48 AM

## 2022-07-15 NOTE — Discharge Summary (Signed)
Physician Discharge Summary  Tracy Nelson WYO:[id number] DOB: [birth year] DOA: 07/08/2022  PCP: [name], [name]  Admit date: 07/08/2022 Discharge date: 07/15/2022 30 Day Unplanned Readmission Risk Score    Flowsheet Row ED to Hosp-Admission (Current) from 07/08/2022 in [facility]  30 Day Unplanned Readmission Risk Score (%) 28.77 Filed at 07/15/2022 0801       This score is the patient's risk of an unplanned readmission within 30 days of being discharged (0 -100%). The score is based on dignosis, age, lab data, medications, orders, and past utilization.   Low:  0-14.9   Medium: 15-21.9   High: 22-29.9   Extreme: 30 and above          Admitted From: Home Disposition: SNF  Recommendations for Outpatient Follow-up:  Follow up with PCP in 1-2 weeks Please obtain BMP/CBC in one week Follow-up with orthopedic in 2 weeks as scheduled Please follow up with your PCP on the following pending results: Unresulted Labs (From admission, onward)     Start     Ordered   07/16/22 0500  CBC with Differential/Platelet  Tomorrow morning,   R        07/15/22 0933   07/16/22 7741  Basic metabolic panel  Tomorrow morning,   R        07/15/22 0933              Home Health: None Equipment/Devices: None  Discharge Condition: Stable CODE STATUS: Full code Diet recommendation: Cardiac/low-sodium  Subjective: Seen and examined.  No complaints.  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  She was found to have a left intertrochanteric fracture.  Orthopedic Dr. Erlinda Hong was consulted plan is for surgery on 12/01 after Eliquis washout.  She was seen by PT OT, they recommended CIR, CIR  declined her, now she is being discharged to SNF.    Postop she developed AKI on CKD.  Received IV fluids.  Patient's creatinine is now back to baseline.  Her Bumex is being reduced to 1 mg p.o. daily starting tomorrow.  Diuretics were held here.  She is known to have nonischemic cardiomyopathy with prior ejection fraction  less than 20%.  Repeated echo during this hospitalization showed ejection fraction 45%.  Heart failure team has been following.   3-Heart failure with reduced ejection fraction, ejection fraction less than 20%, nonischemic cardiomyopathy, EF now 45 % Follows-up with Dr. Aundra Dubin Cardiology consulted for preop assessment, patient felt to be high risk for procedure Continue with Verquvo.  EF improved to 45 % by ECHO Cardiology resume Bumex 1 mg daily.   4-Paroxysmal A-fib: Continue Eliquis.   5-Rheumatoid arthritis: Continue immunosuppressant medication.   6-GERD: Continue with PPI   7-Hypertension: Continue with Midodrine.    8-Dysphagia; Report difficulty swallowing solid and liquids for months. Continue with   PPI BID. Dysphagia improved. Tolerating diet. She would like to hold on doing esophagogram.    Acute blood loss anemia;  S/P one unit PRBC yesterday. Hemoglobin stable    Estimated body mass index is 19.7 kg/m as calculated from the following:   Height as of this encounter: '5\' 1"'$  (1.549 m).   Weight as of this encounter: 47.3 kg.  Discharge plan was discussed with patient and/or family  member and they verbalized understanding and agreed with it.  Discharge Diagnoses:  Principal Problem:   Closed left hip fracture, initial encounter [facility]) Active Problems:   Non-ischemic cardiomyopathy (HCC)   HFrEF (heart failure with reduced ejection fraction) (HCC)   HTN (hypertension)   Atrial flutter (HCC)   CKD (chronic kidney disease), stage IV (HCC)   Chronic systolic CHF (congestive heart failure) ([place])    Discharge Instructions  Discharge Instructions      Weight bearing as tolerated   Complete by: As directed       Allergies as of 07/15/2022       Reactions   Baclofen Nausea And Vomiting   Penicillin G    Other reaction(s): Unknown   Penicillins Rash        Medication List     STOP taking these medications    cholestyramine 4 g packet Commonly known as: Questran       TAKE these medications    amiodarone 200 MG tablet Commonly known as: PACERONE TAKE 1 TABLET BY MOUTH EVERY DAY   azaTHIOprine 50 MG tablet Commonly known as: IMURAN TAKE 2 TABLETS BY MOUTH EVERY DAY   bumetanide 2 MG tablet Commonly known as: BUMEX Take 0.5 tablets (1 mg total) by mouth daily.   calcitRIOL 0.25 MCG capsule Commonly known as: ROCALTROL Take 0.25 mcg by mouth daily.   cyclobenzaprine 10 MG tablet Commonly known as: FLEXERIL Take 1 tablet (10 mg total) by mouth 3 (three) times daily as needed for muscle spasms.   Eliquis 2.5 MG Tabs tablet Generic drug: apixaban TAKE 1 TABLET BY MOUTH TWICE A DAY What changed: how much to take   Ensure Take 237 mLs by mouth 2 (two) times daily between meals.   folic acid 1 MG tablet Commonly known as: FOLVITE TAKE 1 TABLET BY MOUTH EVERY DAY   hydroxychloroquine 200 MG tablet Commonly known as: PLAQUENIL TAKE 1 TABLET BY MOUTH EVERY DAY   hydroxypropyl methylcellulose / hypromellose 2.5 % ophthalmic solution Commonly known as: ISOPTO TEARS / GONIOVISC Place 1 drop into both eyes as needed for dry eyes.   ICY HOT BACK EX Apply 1 application topically daily as needed (pain).   lidocaine 5 % Commonly known as: Lidoderm Place 1 patch onto the skin daily. Remove & Discard patch within 12 hours or as directed by MD   midodrine 5 MG tablet Commonly known as: PROAMATINE Take 1 tablet (5 mg total) by mouth 3 (three) times daily with meals.   multivitamin tablet Take 1 tablet by mouth daily.   omeprazole 40 MG capsule Commonly known as: PRILOSEC Take 40 mg by mouth daily.    oxyCODONE 5 MG immediate release tablet Commonly known as: Oxy IR/ROXICODONE Take 1-2 tablets (5-10 mg total) by mouth every 8 (eight) hours as needed for severe pain.   polyethylene glycol 17 g packet Commonly known as: MIRALAX / GLYCOLAX Take 17 g by mouth as needed.   predniSONE 5 MG tablet Commonly known as: DELTASONE TAKE 1 TABLET BY MOUTH EVERY DAY WITH BREAKFAST What changed: See the new instructions.   Tums E-X 750 750 MG chewable tablet Generic drug: calcium carbonate Chew 2 tablets by mouth 3 (three) times daily as needed.   Verquvo 10 MG Tabs Generic drug: Vericiguat Take 10 mg by mouth daily.   Vitamin D3 25 MCG (1000 UT) Caps Take 1 capsule by mouth daily in the afternoon.  Discharge Care Instructions  (From admission, onward)           Start     Ordered   07/10/22 0000  Weight bearing as tolerated        07/10/22 1226            Follow-up Information     Leandrew Koyanagi, MD Follow up in 2 week(s).   Specialty: Orthopedic Surgery Why: For suture removal, For wound re-check Contact information: [place] [address] [phone]         [name], [name] Follow up in 1 week(s).   Specialty: General Practice Contact information: [place] [address] [phone]                Allergies  Allergen Reactions   Baclofen Nausea And Vomiting   Penicillin G     Other reaction(s): Unknown   Penicillins Rash    Consultations: Orthopedics and cardiology.   Procedures/Studies: US RENAL  Result Date: 07/12/2022 CLINICAL DATA:  Acute renal failure superimposed on chronic kidney disease stage 4 EXAM: RENAL / URINARY TRACT ULTRASOUND COMPLETE COMPARISON:  11/06/2020 FINDINGS: Right Kidney: Renal measurements: 9.5 x 3.5 x 3.7 cm = volume: 64 mL. Echogenicity within normal limits. No mass or hydronephrosis visualized. Left Kidney: Renal measurements: 9.2 x 4.5 x 4.0 cm  = volume: 80 mL. Normal echotexture. No mass. Slight fullness of the collecting system. Bladder: Appears normal for degree of bladder distention. Other: Small simple cyst in the right lobe of the liver measuring 1.5 cm. Increased echotexture throughout the liver suggesting fatty infiltration. IMPRESSION: Mild fullness of the left renal collecting system without overt hydronephrosis. Normal echotexture. Fatty liver. Electronically Signed   By: Rolm Baptise M.D.   On: 07/12/2022 03:53   ECHOCARDIOGRAM COMPLETE  Result Date: 07/11/2022    ECHOCARDIOGRAM REPORT   Patient Name:   Tracy Nelson Date of Exam: 07/11/2022 Medical Rec #:  [id number]         Height:       61.0 in Accession #:    [id number]        Weight:       94.8 lb Date of Birth:  [birth year]          BSA:          1.375 m Patient Age:    82 years          BP:           112/71 mmHg Patient Gender: F                 HR:           88 bpm. Exam Location:  Inpatient Procedure: 2D Echo, Color Doppler and Cardiac Doppler Indications:    X32.44 Acute systolic (congestive) heart failure  History:        Patient has prior history of Echocardiogram examinations, most                 recent 12/05/2020. CHF, Arrythmias:Atrial Flutter; Risk                 Factors:Hypertension.  Sonographer:    Raquel Sarna Senior RDCS Referring Phys: [name] IMPRESSIONS  1. Left ventricular ejection fraction, by estimation, is 45 to 50%. The left ventricle has mildly decreased function. The left ventricle demonstrates global hypokinesis. Left ventricular diastolic parameters are consistent with Grade I diastolic dysfunction (impaired relaxation).  2. Right ventricular systolic  function is normal. The right ventricular size is normal. There is normal pulmonary artery systolic pressure. The estimated right ventricular systolic pressure is 35.3 mmHg.  3. The mitral valve is normal in structure. No evidence of mitral valve regurgitation. No evidence of mitral stenosis.  4. The aortic  valve is calcified. There is mild calcification of the aortic valve. There is mild thickening of the aortic valve. Aortic valve regurgitation is not visualized. Aortic valve sclerosis is present, with no evidence of aortic valve stenosis.  5. The inferior vena cava is normal in size with greater than 50% respiratory variability, suggesting right atrial pressure of 3 mmHg. Comparison(s): Prior images reviewed side by side. The left ventricular function has improved. FINDINGS  Left Ventricle: Left ventricular ejection fraction, by estimation, is 45 to 50%. The left ventricle has mildly decreased function. The left ventricle demonstrates global hypokinesis. The left ventricular internal cavity size was normal in size. There is  no left ventricular hypertrophy. Left ventricular diastolic parameters are consistent with Grade I diastolic dysfunction (impaired relaxation). Right Ventricle: The right ventricular size is normal. No increase in right ventricular wall thickness. Right ventricular systolic function is normal. There is normal pulmonary artery systolic pressure. The tricuspid regurgitant velocity is 2.62 m/s, and  with an assumed right atrial pressure of 3 mmHg, the estimated right ventricular systolic pressure is 29.9 mmHg. Left Atrium: Left atrial size was normal in size. Right Atrium: Right atrial size was normal in size. Pericardium: There is no evidence of pericardial effusion. Mitral Valve: The mitral valve is normal in structure. No evidence of mitral valve regurgitation. No evidence of mitral valve stenosis. Tricuspid Valve: The tricuspid valve is normal in structure. Tricuspid valve regurgitation is mild . No evidence of tricuspid stenosis. Aortic Valve: The aortic valve is calcified. There is mild calcification of the aortic valve. There is mild thickening of the aortic valve. Aortic valve regurgitation is not visualized. Aortic valve sclerosis is present, with no evidence of aortic valve stenosis.  Pulmonic Valve: The pulmonic valve was normal in structure. Pulmonic valve regurgitation is mild. No evidence of pulmonic stenosis. Aorta: The aortic root is normal in size and structure. Venous: The inferior vena cava is normal in size with greater than 50% respiratory variability, suggesting right atrial pressure of 3 mmHg. IAS/Shunts: No atrial level shunt detected by color flow Doppler.  LEFT VENTRICLE PLAX 2D LVIDd:         4.30 cm   Diastology LVIDs:         2.90 cm   LV e' medial:    5.55 cm/s LV PW:         1.00 cm   LV E/e' medial:  13.6 LV IVS:        1.00 cm   LV e' lateral:   8.38 cm/s LVOT diam:     2.00 cm   LV E/e' lateral: 9.0 LV SV:         51 LV SV Index:   37 LVOT Area:     3.14 cm  RIGHT VENTRICLE RV Basal diam:  2.40 cm RV S prime:     16.20 cm/s TAPSE (M-mode): 2.7 cm LEFT ATRIUM             Index        RIGHT ATRIUM           Index LA diam:        2.60 cm 1.89 cm/m   RA Area:  11.60 cm LA Vol (A2C):   22.1 ml 16.07 ml/m  RA Volume:   24.90 ml  18.11 ml/m LA Vol (A4C):   38.7 ml 28.14 ml/m LA Biplane Vol: 29.4 ml 21.38 ml/m  AORTIC VALVE LVOT Vmax:   106.00 cm/s LVOT Vmean:  65.200 cm/s LVOT VTI:    0.162 m  AORTA Ao Root diam: 3.00 cm MITRAL VALVE                TRICUSPID VALVE MV Area (PHT): 3.91 cm     TR Peak grad:   27.5 mmHg MV Decel Time: 194 msec     TR Vmax:        262.00 cm/s MV E velocity: 75.50 cm/s MV A velocity: 125.00 cm/s  SHUNTS MV E/A ratio:  0.60         Systemic VTI:  0.16 m                             Systemic Diam: 2.00 cm Candee Furbish MD Electronically signed by Candee Furbish MD Signature Date/Time: 07/11/2022/12:16:26 PM    Final    DG FEMUR MIN 2 VIEWS LEFT  Result Date: 07/10/2022 CLINICAL DATA:  Intramedullary nail intertrochanteric, surgery EXAM: LEFT FEMUR 2 VIEWS COMPARISON:  CT 07/08/2022 FINDINGS: Intraoperative images during left proximal femur intramedullary fixation for an intertrochanteric fracture. Improved fracture alignment. No evidence of  immediate complication. IMPRESSION: Intraoperative images during left proximal femur intramedullary fixation for an intertrochanteric fracture. Improved fracture alignment. No evidence of immediate complication. Electronically Signed   By: Maurine Simmering M.D.   On: 07/10/2022 13:02   DG C-Arm 1-60 Min-No Report  Result Date: 07/10/2022 Fluoroscopy was utilized by the requesting physician.  No radiographic interpretation.   CT Hip Left Wo Contrast  Result Date: 07/08/2022 CLINICAL DATA:  Left hip fracture EXAM: CT OF THE LEFT HIP WITHOUT CONTRAST TECHNIQUE: Multidetector CT imaging of the left hip was performed according to the standard protocol. Multiplanar CT image reconstructions were also generated. RADIATION DOSE REDUCTION: This exam was performed according to the departmental dose-optimization program which includes automated exposure control, adjustment of the mA and/or kV according to patient size and/or use of iterative reconstruction technique. COMPARISON:  Same-day x-ray FINDINGS: Bones/Joint/Cartilage Acute comminuted intertrochanteric fracture of the proximal left femur. Mild superior migration of the distal fracture component with near 90 degrees of varus angulation. Greater trochanteric fragment is mildly displaced posteriorly. Nondisplaced lesser trochanteric component. Left hip joint alignment is maintained without dislocation. Severe osteoarthritis of the left hip. Visualized portion of the left hemipelvis is intact without fracture or diastasis. No lytic or sclerotic bone lesion is identified. Ligaments Suboptimally assessed by CT. Muscles and Tendons No acute musculotendinous abnormality by CT. Soft tissues Mild soft tissue swelling at the lateral aspect of the hip. No organized fluid collection or hematoma. Atherosclerotic vascular calcifications. Scattered colonic diverticulosis. Multiple calcified uterine fibroids. IMPRESSION: 1. Acute comminuted intertrochanteric fracture of the proximal  left femur with varus angulation. 2. Severe osteoarthritis of the left hip. Electronically Signed   By: Davina Poke D.O.   On: 07/08/2022 19:34   CT Cervical Spine Wo Contrast  Result Date: 07/08/2022 CLINICAL DATA:  Fall.  Neck trauma EXAM: CT CERVICAL SPINE WITHOUT CONTRAST TECHNIQUE: Multidetector CT imaging of the cervical spine was performed without intravenous contrast. Multiplanar CT image reconstructions were also generated. RADIATION DOSE REDUCTION: This exam was performed according to the departmental dose-optimization program which  includes automated exposure control, adjustment of the mA and/or kV according to patient size and/or use of iterative reconstruction technique. COMPARISON:  None Available. FINDINGS: Alignment: Facet joints are aligned without dislocation or traumatic listhesis. Dens and lateral masses are aligned. Skull base and vertebrae: No acute fracture. No primary bone lesion or focal pathologic process. Soft tissues and spinal canal: No prevertebral fluid or swelling. No visible canal hematoma. Disc levels: Mild multilevel disc height loss with uncovertebral spurring. Advanced multilevel bilateral facet arthropathy. Upper chest: Negative. Other: Bilateral carotid atherosclerosis. IMPRESSION: 1. No acute fracture or traumatic listhesis of the cervical spine. 2. Facet-predominant multilevel cervical spondylosis. Electronically Signed   By: Davina Poke D.O.   On: 07/08/2022 19:31   CT HEAD WO CONTRAST (5MM)  Result Date: 07/08/2022 CLINICAL DATA:  Head trauma, minor (Age >= 65y) EXAM: CT HEAD WITHOUT CONTRAST TECHNIQUE: Contiguous axial images were obtained from the base of the skull through the vertex without intravenous contrast. RADIATION DOSE REDUCTION: This exam was performed according to the departmental dose-optimization program which includes automated exposure control, adjustment of the mA and/or kV according to patient size and/or use of iterative  reconstruction technique. COMPARISON:  11/01/2021 FINDINGS: Brain: There is periventricular white matter decreased attenuation consistent with small vessel ischemic changes. Ventricles, sulci and cisterns are prominent consistent with age related involutional changes. No acute intracranial hemorrhage, mass effect or shift. No hydrocephalus. Vascular: No hyperdense vessel or unexpected calcification. Skull: Normal. Negative for fracture or focal lesion. Sinuses/Orbits: No acute finding. IMPRESSION: Atrophy and chronic small vessel ischemic changes. No acute intracranial process identified. Electronically Signed   By: Sammie Bench M.D.   On: 07/08/2022 19:28   DG Hip Unilat W or Wo Pelvis 2-3 Views Left  Result Date: 07/08/2022 CLINICAL DATA:  Left hip pain after fall EXAM: DG HIP (WITH OR WITHOUT PELVIS) 2-3V LEFT COMPARISON:  None Available. FINDINGS: Th acute intertrochanteric fracture of the left femur. There is slight lateral and superior displacement of the distal fragment. Probable additional fracture of the lesser trochanter. The left femoral head remains located within the acetabulum. Uterine fibroids. IMPRESSION: Left intertrochanteric femur fracture. Electronically Signed   By: Placido Sou M.D.   On: 07/08/2022 18:56   DG Chest 1 View  Result Date: 07/08/2022 CLINICAL DATA:  [place] onto left side, left hip fracture, preoperative evaluation EXAM: CHEST  1 VIEW COMPARISON:  11/01/2021 FINDINGS: Single frontal view of the chest demonstrates a stable cardiac silhouette. Stable ectasia of the thoracic aorta. No airspace disease, effusion, or pneumothorax. There are no acute bony abnormalities. IMPRESSION: 1. No acute intrathoracic process. Electronically Signed   By: Randa Ngo M.D.   On: 07/08/2022 18:55     Discharge Exam: Vitals:   07/15/22 0359 07/15/22 0715  BP: 124/70 117/78  Pulse:    Resp: 20 17  Temp: 98.3 F (36.8 C) 98.5 F (36.9 C)  SpO2: 100% 99%   Vitals:    07/14/22 1916 07/15/22 0359 07/15/22 0500 07/15/22 0715  BP: 118/86 124/70  117/78  Pulse:      Resp: '19 20  17  '$ Temp: 98 F (36.7 C) 98.3 F (36.8 C)  98.5 F (36.9 C)  TempSrc: Oral Oral  Oral  SpO2: 97% 100%  99%  Weight:   49.9 kg   Height:        General: Pt is alert, awake, not in acute distress Cardiovascular: RRR, S1/S2 +, no rubs, no gallops Respiratory: CTA bilaterally, no wheezing, no rhonchi Abdominal:  Soft, NT, ND, bowel sounds + Extremities: no edema, no cyanosis    The results of significant diagnostics from this hospitalization (including imaging, microbiology, ancillary and laboratory) are listed below for reference.     Microbiology: Recent Results (from the past 240 hour(s))  Surgical pcr screen     Status: None   Collection Time: 07/10/22  5:28 AM   Specimen: Nasal Mucosa; Nasal Swab  Result Value Ref Range Status   MRSA, PCR NEGATIVE NEGATIVE Final   Staphylococcus aureus NEGATIVE NEGATIVE Final    Comment: (NOTE) The Xpert SA Assay (FDA approved for NASAL specimens in patients 44 years of age and older), is one component of a comprehensive surveillance program. It is not intended to diagnose infection nor to guide or monitor treatment. Performed at [facility], [place] [address], [place], [place] [address]      Labs: BNP (last 3 results) Recent Labs    11/06/21 1241 03/04/22 1629 07/07/22 1515  BNP 190.3* 193.6* 389.3*   Basic Metabolic Panel: Recent Labs  Lab 07/09/22 0632 07/10/22 0809 07/11/22 0248 07/12/22 0200 07/13/22 0405 07/14/22 0806 07/14/22 0819 07/15/22 0333  NA 140   < > 142 144 142 139  --  138  K 4.7   < > 5.1 5.0 4.5 4.5  --  4.4  CL 112*   < > 120* 118* 117* 112*  --  109  CO2 18*   < > 16* 16* 16* 18*  --  18*  GLUCOSE 129*   < > 127* 90 152* 89  --  137*  BUN 65*   < > 65* 66* 56* 44*  --  48*  CREATININE 3.36*   < > 3.78* 3.67* 3.11* 2.67*  --  2.78*  CALCIUM 9.0   < > 9.1 9.2 8.6* 8.7*  --  8.8*  MG  2.1  --   --   --   --   --  2.0  --   PHOS 5.1*  --   --   --   --   --   --  2.2*   < > = values in this interval not displayed.   Liver Function Tests: Recent Labs  Lab 07/09/22 7342 07/15/22 0333  ALBUMIN 3.1* 2.0*   No results for input(s): "LIPASE", "AMYLASE" in the last 168 hours. No results for input(s): "AMMONIA" in the last 168 hours. CBC: Recent Labs  Lab 07/08/22 2015 07/09/22 0632 07/10/22 0809 07/11/22 0248 07/12/22 0200 07/13/22 0405 07/15/22 0333  WBC 10.4   < > 10.4 9.2 8.7 8.3 7.3  NEUTROABS 9.1*  --   --   --   --   --   --   HGB 10.9*   < > 8.9* 7.6* 9.3* 9.6* 7.9*  HCT 36.0   < > 29.3* 24.6* 28.6* 29.8* 24.7*  MCV 95.7   < > 95.1 95.3 91.1 92.8 92.2  PLT 246   < > 244 224 228 246 247   < > = values in this interval not displayed.   Cardiac Enzymes: No results for input(s): "CKTOTAL", "CKMB", "CKMBINDEX", "TROPONINI" in the last 168 hours. BNP: Invalid input(s): "POCBNP" CBG: Recent Labs  Lab 07/08/22 2357 07/10/22 2041  GLUCAP 173* 144*   D-Dimer No results for input(s): "DDIMER" in the last 72 hours. Hgb A1c No results for input(s): "HGBA1C" in the last 72 hours. Lipid Profile No results for input(s): "CHOL", "HDL", "LDLCALC", "TRIG", "CHOLHDL", "LDLDIRECT" in the last 72 hours.  Thyroid function studies No results for input(s): "TSH", "T4TOTAL", "T3FREE", "THYROIDAB" in the last 72 hours.  Invalid input(s): "FREET3" Anemia work up No results for input(s): "VITAMINB12", "FOLATE", "FERRITIN", "TIBC", "IRON", "RETICCTPCT" in the last 72 hours. Urinalysis    Component Value Date/Time   COLORURINE YELLOW 07/13/2022 0945   APPEARANCEUR CLEAR 07/13/2022 0945   LABSPEC 1.012 07/13/2022 0945   PHURINE 5.0 07/13/2022 0945   GLUCOSEU NEGATIVE 07/13/2022 0945   HGBUR NEGATIVE 07/13/2022 0945   BILIRUBINUR NEGATIVE 07/13/2022 0945   BILIRUBINUR negative 06/04/2021 1151   KETONESUR NEGATIVE 07/13/2022 0945   PROTEINUR NEGATIVE 07/13/2022 0945    UROBILINOGEN 0.2 06/04/2021 1151   NITRITE NEGATIVE 07/13/2022 0945   LEUKOCYTESUR NEGATIVE 07/13/2022 0945   Sepsis Labs Recent Labs  Lab 07/11/22 0248 07/12/22 0200 07/13/22 0405 07/15/22 0333  WBC 9.2 8.7 8.3 7.3   Microbiology Recent Results (from the past 240 hour(s))  Surgical pcr screen     Status: None   Collection Time: 07/10/22  5:28 AM   Specimen: Nasal Mucosa; Nasal Swab  Result Value Ref Range Status   MRSA, PCR NEGATIVE NEGATIVE Final   Staphylococcus aureus NEGATIVE NEGATIVE Final    Comment: (NOTE) The Xpert SA Assay (FDA approved for NASAL specimens in patients 59 years of age and older), is one component of a comprehensive surveillance program. It is not intended to diagnose infection nor to guide or monitor treatment. Performed at [facility], [place] [address], [place], [place] [address]      Time coordinating discharge: Over 30 minutes  SIGNED:   Darliss Cheney, MD  [facility] 07/15/2022, 10:21 AM *Please note that this is a verbal dictation therefore any spelling or grammatical errors are due to the "[place] One" system interpretation. If 7PM-7AM, please contact night-coverage [email or web address]

## 2022-07-15 NOTE — Plan of Care (Signed)
Pt. Ready for discharge with no s/s of acute distress, pain well controlled, surgical site C/D/I. Report called to receiving facility, pt. Will be transported via ambulance.

## 2022-07-15 NOTE — Progress Notes (Signed)
CSW involvement due to [facility] transportation.  Pt lives with daughter, CSW spoke with daughter Antionette Poles, who reports that pt scheduled several MD appts within the last year without communicating with her and she did have to reschedule these appts.  [facility] does provide transportation to all medical appts and will continue to do so. Lurline Idol, MSW, LCSW 12/6/202311:56 AM

## 2022-07-15 NOTE — TOC Transition Note (Signed)
Transition of Care [facility]) - CM/SW Discharge Note   Patient Details  Name: Tracy Nelson MRN: [id number] Date of Birth: [birth year]  Transition of Care [facility] CM/SW Contact:  Joanne Chars, LCSW Phone Number: 07/15/2022, 11:28 AM   Clinical Narrative:   Pt discharging to [place], room 1202. RN call report to [phone].     Final next level of care: Skilled Nursing Facility Barriers to Discharge: Barriers Resolved   Patient Goals and CMS Choice Patient states their goals for this hospitalization and ongoing recovery are:: walk again CMS Medicare.gov Compare Post Acute Care list provided to:: Patient Choice offered to / list presented to : Patient  Discharge Placement              Patient chooses bed at: [facility] Patient to be transferred to facility by: [place] Name of family member notified: daugher Afeefa Patient and family notified of of transfer: 07/15/22  Discharge Plan and Services In-house Referral: Clinical Social Work   Post Acute Care Choice: [place]                               Social Determinants of Health (SDOH) Interventions     Readmission Risk Interventions     No data to display

## 2022-07-15 NOTE — Progress Notes (Signed)
Patient's home medication returned to patient from pharmacy.

## 2022-07-15 NOTE — Progress Notes (Signed)
Physical Therapy Treatment Patient Details Name: Tracy Nelson MRN: [id number] DOB: 12/04/1945 Today's Date: 07/15/2022   History of Present Illness Pt is a 76 y.o. female admitted s/p fall and left intertrochanteric fracture. Underwent IM nailing on 07/10/22.  PMH significant for HFrEF<20%, nonischemic cardiomyopathy, rheumatoid arthritis on immunosuppressants, GERD, CKD IV.,    PT Comments    Pt seen for PT tx with pt agreeable. Pt with flat affect & appears sad re: situation with PT providing encouragement throughout. Pt assisted off bed pan (pt received on it) & pt transfers supine<>sit with mod assist. Session focused on LLE strengthening exercises, STS from EOB, standing weight shifting L<>R, and side stepping at EOB. During side stepping to R pt unable to weight shift to L enough to lift R foot off floor so instead she slides it. Pt does engage in peri hygiene while standing with min assist. Continue to recommend STR upon d/c to maximize independence with functional mobility & reduce fall risk prior to return home.    Recommendations for follow up therapy are one component of a multi-disciplinary discharge planning process, led by the attending physician.  Recommendations may be updated based on patient status, additional functional criteria and insurance authorization.  Follow Up Recommendations  Skilled nursing-short term rehab (<3 hours/day) Can patient physically be transported by private vehicle: No   Assistance Recommended at Discharge Frequent or constant Supervision/Assistance  Patient can return home with the following A lot of help with walking and/or transfers;A lot of help with bathing/dressing/bathroom;Assist for transportation;Assistance with cooking/housework;Help with stairs or ramp for entrance;Direct supervision/assist for financial management   Equipment Recommendations  Rolling walker (2 wheels);BSC/3in1    Recommendations for Other Services Rehab consult      Precautions / Restrictions Precautions Precautions: Fall Restrictions Weight Bearing Restrictions: Yes LLE Weight Bearing: Weight bearing as tolerated     Mobility  Bed Mobility Overal bed mobility: Needs Assistance Bed Mobility: Supine to Sit, Sit to Supine     Supine to sit: Mod assist, HOB elevated (to move LLE to EOB, upright trunk) Sit to supine: Mod assist (elevate BLE onto bed)        Transfers Overall transfer level: Needs assistance Equipment used: Rolling walker (2 wheels) Transfers: Sit to/from Stand Sit to Stand: Mod assist, Min assist (Pt leans posteriorly onto EOB, requires min cuing re: hand placement to push up to standing)                Ambulation/Gait                   Stairs             Wheelchair Mobility    Modified Rankin (Stroke Patients Only)       Balance Overall balance assessment: Needs assistance Sitting-balance support: Feet supported, Bilateral upper extremity supported Sitting balance-Leahy Scale: Fair Sitting balance - Comments: seated EOB   Standing balance support: Bilateral upper extremity supported, Reliant on assistive device for balance Standing balance-Leahy Scale: Poor                              Cognition Arousal/Alertness: Awake/alert Behavior During Therapy: WFL for tasks assessed/performed, Flat affect Overall Cognitive Status: Within Functional Limits for tasks assessed                                 General Comments:  Pt appears to be feeling down re: situation, noting "I was running around a week ago". PT provides encouragement.        Exercises General Exercises - Lower Extremity Long Arc Quad: AROM, Strengthening, Left, 20 reps, Seated Hip Flexion/Marching: AROM, Strengthening, Seated, Left, 10 reps    General Comments        Pertinent Vitals/Pain Pain Assessment Pain Assessment: Faces Faces Pain Scale: Hurts even more Pain Location: left hip Pain  Descriptors / Indicators: Sore, Guarding, Grimacing Pain Intervention(s): Monitored during session, RN gave pain meds during session    Home Living                          Prior Function            PT Goals (current goals can now be found in the care plan section) Acute Rehab PT Goals Patient Stated Goal: get better PT Goal Formulation: With patient Time For Goal Achievement: 07/25/22 Potential to Achieve Goals: Good Progress towards PT goals: Progressing toward goals    Frequency    Min 3X/week      PT Plan Current plan remains appropriate    Co-evaluation              AM-PAC PT "6 Clicks" Mobility   Outcome Measure  Help needed turning from your back to your side while in a flat bed without using bedrails?: A Lot Help needed moving from lying on your back to sitting on the side of a flat bed without using bedrails?: A Lot Help needed moving to and from a bed to a chair (including a wheelchair)?: Total Help needed standing up from a chair using your arms (e.g., wheelchair or bedside chair)?: A Lot Help needed to walk in hospital room?: Total Help needed climbing 3-5 steps with a railing? : Total 6 Click Score: 9    End of Session Equipment Utilized During Treatment: Gait belt Activity Tolerance: Patient tolerated treatment well;Patient limited by pain Patient left: in bed;with nursing/sitter in room   PT Visit Diagnosis: Unsteadiness on feet (R26.81);Other abnormalities of gait and mobility (R26.89);History of falling (Z91.81);Difficulty in walking, not elsewhere classified (R26.2);Pain;Muscle weakness (generalized) (M62.81) Pain - Right/Left: Left Pain - part of body: Hip     Time: 1324-4010 PT Time Calculation (min) (ACUTE ONLY): 27 min  Charges:  $Therapeutic Activity: 23-37 mins                     Lavone Nian, PT, DPT 07/15/22, 12:59 PM   Waunita Schooner 07/15/2022, 12:57 PM

## 2022-07-15 NOTE — Progress Notes (Signed)
PROGRESS NOTE    Tracy Nelson  MVE:[id number] DOB: 1946/04/18 DOA: 07/08/2022 PCP: [name], [name]   Brief Narrative:  76 year old with past medical history significant for systolic heart failure ejection fraction less than 20%, nonischemic cardiomyopathy, rheumatoid arthritis on immunosuppressants, GERD, CKD stage IV,MGUS, presents to the ED for after she had a mechanical fall, she landed on her left hip.  She was found to have a left intertrochanteric fracture.  Orthopedic Dr. Erlinda Hong was consulted plan is for surgery on 12/01 after Eliquis washout.    Patient underwent treatment of intertrochanteric fracture with intramedullary implant by Dr. Rigoberto Noel on 12/1.  Postop she developed AKI on CKD.  Received IV fluids.  She is known to have nonischemic cardiomyopathy with prior ejection fraction  less than 20%.  Repeated echo during this hospitalization showed ejection fraction 45%.  Heart failure team has been following.   Patient will require rehab at discharge.  Assessment & Plan:   Principal Problem:   Closed left hip fracture, initial encounter ([place]) Active Problems:   Non-ischemic cardiomyopathy (HCC)   HFrEF (heart failure with reduced ejection fraction) (HCC)   HTN (hypertension)   Atrial flutter (HCC)   CKD (chronic kidney disease), stage IV (HCC)   Chronic systolic CHF (congestive heart failure) ([place])  1-Closed Left Hip Fracture:  -Orthopedic consulted, cardiology consulted for pre op evaluation.  -Underwent treatment of intertrochanteric fracture with intramedullary implant with Dr. Erlinda Hong 12/1. -PT recommending CIR, CIR declined, now PT recommends skilled nursing facility.  Social worker consulted.  On Eliquis.   2-CKD stage IV, acute on chronic.  Metabolic acidosis.  Cr baseline 2.4  Cr 3.6 Renal US: Mild fullness of the left renal collecting system without overt hydronephrosis. Normal echotexture. Nephrology consulted.  Appreciate nephrology assistance.  Patient received IV  fluids. AKI in the setting of hypotension, ATN.  Bumex resume 12/04.  Cardiology wants to continue this but nephrology recommends continuing to hold until discharge.  Creatinine around 2.6.  Will defer to cardiology and nephrology about diuretics decision.   3-Heart failure with reduced ejection fraction, ejection fraction less than 20%, nonischemic cardiomyopathy, EF now 45 % Follows-up with Dr. Aundra Dubin Cardiology consulted for preop assessment, patient felt to be high risk for procedure Continue with Verquvo.  EF improved to 45 % by ECHO Cardiology resume Bumex 1 mg daily back to nephrology wants to hold as mentioned above.   4-Paroxysmal A-fib: Eliquis resume 12/03   5-Rheumatoid arthritis: Present immunosuppressant medication.   6-GERD: Continue with PPI   7-Hypertension: Continue with Midodrine.    8-Dysphagia; Report difficulty swallowing solid and liquids for months. Continue with   PPI BID. Dysphagia improved. Tolerating diet. She would like to hold on doing esophagogram.    Acute blood loss anemia;  S/P one unit PRBC Hemoglobin stable    Estimated body mass index is 19.7 kg/m as calculated from the following:   Height as of this encounter: '5\' 1"'$  (1.549 m).   Weight as of this encounter: 47.3 kg.  DVT prophylaxis: apixaban (ELIQUIS) tablet 2.5 mg Start: 07/12/22 1130 SCDs Start: 07/10/22 1401 Place TED hose Start: 07/10/22 1401 SCDs Start: 07/08/22 2132   Code Status: Full Code  Family Communication:  None present at bedside.  Plan of care discussed with patient in length and he/she verbalized understanding and agreed with it.  Status is: Inpatient Remains inpatient appropriate because: Medically stable, pending placement to SNF.   Estimated body mass index is 20.79 kg/m as calculated from the following:  Height as of this encounter: '5\' 1"'$  (1.549 m).   Weight as of this encounter: 49.9 kg.    Nutritional Assessment: Body mass index is 20.79 kg/m.Marland Kitchen Seen by  dietician.  I agree with the assessment and plan as outlined below: Nutrition Status:        . Skin Assessment: I have examined the patient's skin and I agree with the wound assessment as performed by the wound care RN as outlined below:    Consultants:  Nephrology Orthopedics Cardiology  Procedures:  As above  Antimicrobials:  Anti-infectives (From admission, onward)    Start     Dose/Rate Route Frequency Ordered Stop   07/11/22 1100  ceFAZolin (ANCEF) IVPB 1 g/50 mL premix        1 g 100 mL/hr over 30 Minutes Intravenous Every 24 hours 07/10/22 1602 07/10/22 1847   07/10/22 1500  ceFAZolin (ANCEF) IVPB 2g/100 mL premix  Status:  Discontinued        2 g 200 mL/hr over 30 Minutes Intravenous Every 6 hours 07/10/22 1402 07/10/22 1602   07/10/22 0800  ceFAZolin (ANCEF) IVPB 1 g/50 mL premix        1 g 100 mL/hr over 30 Minutes Intravenous On call to O.R. 07/09/22 2326 07/10/22 1144   07/09/22 1215  hydroxychloroquine (PLAQUENIL) tablet 200 mg        200 mg Oral Daily 07/09/22 1126           Subjective: Patient seen and examined.  She complains of pain with activity but otherwise has no complaints.  No shortness of breath.  Objective: Vitals:   07/14/22 1916 07/15/22 0359 07/15/22 0500 07/15/22 0715  BP: 118/86 124/70  117/78  Pulse:      Resp: '19 20  17  '$ Temp: 98 F (36.7 C) 98.3 F (36.8 C)  98.5 F (36.9 C)  TempSrc: Oral Oral  Oral  SpO2: 97% 100%  99%  Weight:   49.9 kg   Height:       No intake or output data in the 24 hours ending 07/15/22 0929 Filed Weights   07/12/22 0700 07/14/22 0500 07/15/22 0500  Weight: 45.8 kg 47.3 kg 49.9 kg    Examination:  General exam: Appears calm and comfortable  Respiratory system: Clear to auscultation. Respiratory effort normal. Cardiovascular system: S1 & S2 heard, RRR. No JVD, murmurs, rubs, gallops or clicks. No pedal edema. Gastrointestinal system: Abdomen is nondistended, soft and nontender. No organomegaly  or masses felt. Normal bowel sounds heard. Central nervous system: Alert and oriented. No focal neurological deficits. Skin: No rashes, lesions or ulcers Psychiatry: Judgement and insight appear normal. Mood & affect appropriate.    Data Reviewed: I have personally reviewed following labs and imaging studies  CBC: Recent Labs  Lab 07/08/22 2015 07/09/22 0632 07/10/22 0809 07/11/22 0248 07/12/22 0200 07/13/22 0405 07/15/22 0333  WBC 10.4   < > 10.4 9.2 8.7 8.3 7.3  NEUTROABS 9.1*  --   --   --   --   --   --   HGB 10.9*   < > 8.9* 7.6* 9.3* 9.6* 7.9*  HCT 36.0   < > 29.3* 24.6* 28.6* 29.8* 24.7*  MCV 95.7   < > 95.1 95.3 91.1 92.8 92.2  PLT 246   < > 244 224 228 246 247   < > = values in this interval not displayed.   Basic Metabolic Panel: Recent Labs  Lab 07/09/22 0632 07/10/22 0809 07/11/22 0248 07/12/22 0200  07/13/22 0405 07/14/22 0806 07/14/22 0819 07/15/22 0333  NA 140   < > 142 144 142 139  --  138  K 4.7   < > 5.1 5.0 4.5 4.5  --  4.4  CL 112*   < > 120* 118* 117* 112*  --  109  CO2 18*   < > 16* 16* 16* 18*  --  18*  GLUCOSE 129*   < > 127* 90 152* 89  --  137*  BUN 65*   < > 65* 66* 56* 44*  --  48*  CREATININE 3.36*   < > 3.78* 3.67* 3.11* 2.67*  --  2.78*  CALCIUM 9.0   < > 9.1 9.2 8.6* 8.7*  --  8.8*  MG 2.1  --   --   --   --   --  2.0  --   PHOS 5.1*  --   --   --   --   --   --  2.2*   < > = values in this interval not displayed.   GFR: Estimated Creatinine Clearance: 13 mL/min (A) (by C-G formula based on SCr of 2.78 mg/dL (H)). Liver Function Tests: Recent Labs  Lab 07/09/22 0998 07/15/22 0333  ALBUMIN 3.1* 2.0*   No results for input(s): "LIPASE", "AMYLASE" in the last 168 hours. No results for input(s): "AMMONIA" in the last 168 hours. Coagulation Profile: No results for input(s): "INR", "PROTIME" in the last 168 hours. Cardiac Enzymes: No results for input(s): "CKTOTAL", "CKMB", "CKMBINDEX", "TROPONINI" in the last 168 hours. BNP  (last 3 results) No results for input(s): "PROBNP" in the last 8760 hours. HbA1C: No results for input(s): "HGBA1C" in the last 72 hours. CBG: Recent Labs  Lab 07/08/22 2357 07/10/22 2041  GLUCAP 173* 144*   Lipid Profile: No results for input(s): "CHOL", "HDL", "LDLCALC", "TRIG", "CHOLHDL", "LDLDIRECT" in the last 72 hours. Thyroid Function Tests: No results for input(s): "TSH", "T4TOTAL", "FREET4", "T3FREE", "THYROIDAB" in the last 72 hours. Anemia Panel: No results for input(s): "VITAMINB12", "FOLATE", "FERRITIN", "TIBC", "IRON", "RETICCTPCT" in the last 72 hours. Sepsis Labs: No results for input(s): "PROCALCITON", "LATICACIDVEN" in the last 168 hours.  Recent Results (from the past 240 hour(s))  Surgical pcr screen     Status: None   Collection Time: 07/10/22  5:28 AM   Specimen: Nasal Mucosa; Nasal Swab  Result Value Ref Range Status   MRSA, PCR NEGATIVE NEGATIVE Final   Staphylococcus aureus NEGATIVE NEGATIVE Final    Comment: (NOTE) The Xpert SA Assay (FDA approved for NASAL specimens in patients 67 years of age and older), is one component of a comprehensive surveillance program. It is not intended to diagnose infection nor to guide or monitor treatment. Performed at [facility], [place] [address], [place], [place] [address]      Radiology Studies: No results found.  Scheduled Meds:  amiodarone  200 mg Oral Daily   apixaban  2.5 mg Oral BID   azaTHIOprine  100 mg Oral Daily   bumetanide  1 mg Oral Daily   cholestyramine  4 g Oral Daily   docusate sodium  100 mg Oral BID   ferrous sulfate  325 mg Oral Q breakfast   hydroxychloroquine  200 mg Oral Daily   midodrine  2.5 mg Oral TID WC   multivitamin with minerals  1 tablet Oral Daily   pantoprazole  40 mg Oral BID   pneumococcal 20-valent conjugate vaccine  0.5 mL Intramuscular Tomorrow-1000  polyethylene glycol  17 g Oral BID   predniSONE  5 mg Oral Q breakfast   senna-docusate  1 tablet Oral QHS    Vericiguat  10 mg Oral Daily   Continuous Infusions:  sodium chloride     methocarbamol (ROBAXIN) IV       LOS: 7 days   Darliss Cheney, MD [facility]  07/15/2022, 9:29 AM   *Please note that this is a verbal dictation therefore any spelling or grammatical errors are due to the "[place] [employer]" system interpretation.  Please page via [place] and do not message via secure chat for urgent patient care matters. Secure chat can be used for non urgent patient care matters.  How to contact the [facility] Attending or Consulting provider [place] or covering provider during after hours [place], for this patient?  Check the care team in [facility] and look for a) attending/consulting TRH provider listed and b) the [facility] team listed. Page or secure chat [phone]. Log into [email or web address] and use [place]'s universal password to access. If you do not have the password, please contact the hospital operator. Locate the [facility] provider you are looking for under [facility] and page to a number that you can be directly reached. If you still have difficulty reaching the provider, please page the [facility] (Director on Call) for the Hospitalists listed on amion for assistance.

## 2022-07-15 NOTE — TOC Progression Note (Addendum)
Transition of Care [facility]) - Progression Note    Patient Details  Name: Tracy Nelson MRN: [id number] Date of Birth: 1946-03-19  Transition of Care [facility]) CM/SW Contact  Tracy Chars, LCSW Phone Number: 07/15/2022, 10:03 AM  Clinical Narrative:   CSW spoke with pt and with daughter Tracy Nelson.  Daughter asking for response from Tracy Nelson, but that SNF is out of network with [employer].  Daughter wants to accept offer with Tracy Nelson.  CSW confirmed with Tracy Nelson/Tracy Nelson that they have bed and can receive pt today.   1010: Auth submitted in [place] and approved: [id number], 3 days: 12/6-12/8.  MD informed.  Expected Discharge Plan: [place] Barriers to Discharge: Continued Medical Work up, SNF Pending bed offer  Expected Discharge Plan and Services Expected Discharge Plan: [place] In-house Referral: Clinical Social Work   Post Acute Care Choice: [place] Living arrangements for the past 2 months: Single Family Home                                       Social Determinants of Health (SDOH) Interventions    Readmission Risk Interventions     No data to display

## 2022-07-16 ENCOUNTER — Telehealth: Payer: Self-pay

## 2022-07-16 NOTE — Telephone Encounter (Signed)
Transition Care Management Unsuccessful Follow-up Telephone Call  Date of discharge and from where:  07/15/2022  Attempts:  1st Attempt  Reason for unsuccessful TCM follow-up call:  Left voice message

## 2022-07-17 ENCOUNTER — Telehealth: Payer: Self-pay

## 2022-07-17 NOTE — Telephone Encounter (Signed)
Transition Care Management Follow-up Telephone Call Date of discharge and from where: 07/15/2022 [place]  How have you been since you were released from the hospital? Pt states she is doing okay, she doe shave frequent urination.  Any questions or concerns? No  Items Reviewed: Did the pt receive and understand the discharge instructions provided? Yes  Medications obtained and verified? Yes  Other? Yes  Any new allergies since your discharge? No  Dietary orders reviewed? Yes Do you have support at home? Yes   Home Care and Equipment/Supplies: Were home health services ordered? no If so, what is the name of the agency? N/a  Has the agency set up a time to come to the patient's home? no Were any new equipment or medical supplies ordered?  No What is the name of the medical supply agency? N/a Were you able to get the supplies/equipment? no Do you have any questions related to the use of the equipment or supplies? No  Functional Questionnaire: (I = Independent and D = Dependent) ADLs: I/d  Bathing/Dressing- I/d  Meal Prep- I/d  Eating- I/d  Maintaining continence- I/d  Transferring/Ambulation- I/d  Managing Meds- I/d  Follow up appointments reviewed:  PCP Hospital f/u appt confirmed? Yes  Scheduled to see janece moore on n/a @ n/a. [place] [facility] f/u appt confirmed? No  Scheduled to see n/a on n/a @ n/a. Are transportation arrangements needed? No  If their condition worsens, is the pt aware to call PCP or go to the Emergency Dept.? Yes Was the patient provided with contact information for the PCP's office or ED? Yes Was to pt encouraged to call back with questions or concerns? Yes

## 2022-07-22 ENCOUNTER — Other Ambulatory Visit (HOSPITAL_COMMUNITY): Payer: Self-pay | Admitting: Cardiology

## 2022-07-22 ENCOUNTER — Encounter (HOSPITAL_COMMUNITY): Payer: Medicare PPO

## 2022-07-24 ENCOUNTER — Encounter: Payer: Medicare PPO | Admitting: Orthopaedic Surgery

## 2022-07-28 ENCOUNTER — Ambulatory Visit (INDEPENDENT_AMBULATORY_CARE_PROVIDER_SITE_OTHER): Payer: Medicare PPO

## 2022-07-28 ENCOUNTER — Encounter: Payer: Self-pay | Admitting: Orthopaedic Surgery

## 2022-07-28 ENCOUNTER — Ambulatory Visit (INDEPENDENT_AMBULATORY_CARE_PROVIDER_SITE_OTHER): Payer: Medicare PPO | Admitting: Physician Assistant

## 2022-07-28 DIAGNOSIS — M25552 Pain in left hip: Secondary | ICD-10-CM | POA: Diagnosis not present

## 2022-07-28 NOTE — Progress Notes (Signed)
Post-Op Visit Note   Patient: Tracy Nelson           Date of Birth: 03-30-1946           MRN: [id number] Visit Date: 07/28/2022 PCP: [name], [name]   Assessment & Plan:  Chief Complaint:  Chief Complaint  Patient presents with   Left Hip - Routine Post Op   Visit Diagnoses:  1. Pain of left hip     Plan: Patient is a very pleasant 76 year old female who comes in today 3 weeks status post left hip IM nail from an intertrochanteric femur fracture, date of surgery 07/10/2022.  She has been doing okay but has been in a moderate amount of pain with any movement of the hip.  She is at a rehab facility where she is getting physical therapy.  She is taking Tylenol and oxycodone twice daily.  No fevers or chills.  She is on chronic prednisone.  Examination of her left hip reveals well healed surgical incisions with nylon sutures in place.  No evidence of infection or cellulitis.  Calves are soft and nontender.  She is neurovascular intact distally.  Today, staples were removed and Steri-Strips applied.  She will continue with physical therapy weightbearing as tolerated.  I recommended that she increase the dose and/or frequency of her pain medication to see if this will help.  She will follow-up with [place] in 3 weeks for repeat evaluation and x-rays of the left femur.  Call with concerns or questions in the meantime.  Patient was seen independently by me today  Follow-Up Instructions: Return in about 3 weeks (around 08/18/2022).   Orders:  Orders Placed This Encounter  Procedures   XR HIP UNILAT W OR W/O PELVIS 2-3 VIEWS LEFT   No orders of the defined types were placed in this encounter.   Imaging: XR HIP UNILAT W OR W/O PELVIS 2-3 VIEWS LEFT  Result Date: 07/28/2022 Stable alignment of the fracture without hardware complication   PMFS History: Patient Active Problem List   Diagnosis Date Noted   Chronic systolic CHF (congestive heart failure) ([place]) 07/13/2022   CKD (chronic  kidney disease), stage IV ([place]) 07/09/2022   Closed left hip fracture, initial encounter ([place]) 07/08/2022   Corneal melt, 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   Monoclonal gammopathy 03/10/2021   Hypotension 10/22/2020   Atypical chest pain 10/22/2020   CKD (chronic kidney disease), stage III ([place]) 10/22/2020   Nausea & vomiting 10/22/2020   Physical deconditioning 10/22/2020   Atrial flutter ([place]) 10/18/2020   Arthritis 07/15/2020   GERD (gastroesophageal reflux disease) 07/15/2020   Colitis 07/15/2020   Non-ischemic cardiomyopathy ([place]) 07/15/2020   HFrEF (heart failure with reduced ejection fraction) ([place]) 07/15/2020   HTN (hypertension) 07/15/2020   Osteoporosis 07/15/2020   Palpitations 07/15/2020   Acute gout of left shoulder 07/10/2020   Rheumatoid arthritis involving multiple sites with positive rheumatoid factor ([place]) 10/28/2015   Mitral regurgitation 05/30/2014   Acute on chronic combined systolic (congestive) and diastolic (congestive) heart failure ([place]) 05/19/2014   Vitamin D deficiency 05/15/2013   Encounter for long-term (current) use of other medications 12/14/2008   Sjogren's syndrome ([place]) 12/14/2008   Past Medical History:  Diagnosis Date   Acid reflux 07/15/2020   Arthritis 07/15/2020   CHF (congestive heart failure) ([place])    Colitis 07/15/2020   Dilated cardiomyopathy ([place]) 07/15/2020   HFrEF (heart failure with reduced ejection fraction) ([place]) 07/15/2020   HTN (hypertension) 07/15/2020   Osteoporosis 07/15/2020   Palpitations  07/15/2020   Rheumatoid arthritis ([place]) 07/15/2020    Family History  Problem Relation Age of Onset   Hypertension Mother    Diabetes Mother    Hypertension Father    Diabetes Father    Breast cancer Maternal Aunt    Breast cancer Paternal Aunt    Arthritis Maternal Grandmother    Lung disease Paternal Grandfather    Cancer Brother     Past Surgical History:  Procedure Laterality Date   CARDIOVERSION N/A 11/08/2020   Procedure: CARDIOVERSION;   Surgeon: [name], MD;  Location: [facility];  Service: Cardiovascular;  Laterality: N/A;   INTRAMEDULLARY (IM) NAIL INTERTROCHANTERIC Left 07/10/2022   Procedure: INTRAMEDULLARY (IM) NAIL INTERTROCHANTERIC;  Surgeon: Leandrew Koyanagi, MD;  Location: [facility];  Service: Orthopedics;  Laterality: Left;   RIGHT HEART CATH N/A 08/14/2020   Procedure: RIGHT HEART CATH;  Surgeon: [name], MD;  Location: [facility];  Service: Cardiovascular;  Laterality: N/A;   TEE WITHOUT CARDIOVERSION N/A 11/08/2020   Procedure: TRANSESOPHAGEAL ECHOCARDIOGRAM (TEE);  Surgeon: [name], MD;  Location: [facility];  Service: Cardiovascular;  Laterality: N/A;   Social History   Occupational History   Occupation: Retired  Tobacco Use   Smoking status: Never   Smokeless tobacco: Never  Vaping Use   Vaping Use: Never used  Substance and Sexual Activity   Alcohol use: Never   Drug use: Never   Sexual activity: Not Currently

## 2022-07-29 ENCOUNTER — Encounter (HOSPITAL_COMMUNITY): Payer: Self-pay | Admitting: *Deleted

## 2022-07-29 NOTE — Progress Notes (Signed)
Pt's daughter dropped off [employer] info, she was sch to go on a trip 12/17-12/22 but had to cancel due to pt's illness and hospitalization. Forms completed and signed by Dr Aundra Dubin. Daughter is aware and will p/u forms at front desk.

## 2022-07-30 ENCOUNTER — Encounter: Payer: Self-pay | Admitting: Orthopaedic Surgery

## 2022-08-04 ENCOUNTER — Ambulatory Visit (HOSPITAL_COMMUNITY)
Admit: 2022-08-04 | Discharge: 2022-08-04 | Disposition: A | Discharge status: Home / Self Care | Payer: Medicare PPO | Source: Ambulatory Visit | Attending: Adult Health | Admitting: Adult Health

## 2022-08-04 VITALS — BP 128/78 | HR 85 | Wt 100.6 lb

## 2022-08-04 DIAGNOSIS — D472 Monoclonal gammopathy: Secondary | ICD-10-CM | POA: Diagnosis not present

## 2022-08-04 DIAGNOSIS — I480 Paroxysmal atrial fibrillation: Secondary | ICD-10-CM

## 2022-08-04 DIAGNOSIS — Z7952 Long term (current) use of systemic steroids: Secondary | ICD-10-CM | POA: Diagnosis not present

## 2022-08-04 DIAGNOSIS — Z79899 Other long term (current) drug therapy: Secondary | ICD-10-CM | POA: Insufficient documentation

## 2022-08-04 DIAGNOSIS — I5022 Chronic systolic (congestive) heart failure: Secondary | ICD-10-CM | POA: Insufficient documentation

## 2022-08-04 DIAGNOSIS — Z7901 Long term (current) use of anticoagulants: Secondary | ICD-10-CM | POA: Insufficient documentation

## 2022-08-04 DIAGNOSIS — I4891 Unspecified atrial fibrillation: Secondary | ICD-10-CM | POA: Insufficient documentation

## 2022-08-04 DIAGNOSIS — Z8249 Family history of ischemic heart disease and other diseases of the circulatory system: Secondary | ICD-10-CM | POA: Insufficient documentation

## 2022-08-04 DIAGNOSIS — N184 Chronic kidney disease, stage 4 (severe): Secondary | ICD-10-CM | POA: Insufficient documentation

## 2022-08-04 DIAGNOSIS — I428 Other cardiomyopathies: Secondary | ICD-10-CM | POA: Insufficient documentation

## 2022-08-04 DIAGNOSIS — M25552 Pain in left hip: Secondary | ICD-10-CM | POA: Diagnosis not present

## 2022-08-04 DIAGNOSIS — I43 Cardiomyopathy in diseases classified elsewhere: Secondary | ICD-10-CM | POA: Insufficient documentation

## 2022-08-04 DIAGNOSIS — M069 Rheumatoid arthritis, unspecified: Secondary | ICD-10-CM | POA: Insufficient documentation

## 2022-08-04 NOTE — Patient Instructions (Signed)
Following orders sent to [facility]:  Continue current medications Follow up in 3 months with echocardiogram and Dr Aundra Dubin

## 2022-08-04 NOTE — Progress Notes (Signed)
Advanced Heart Failure Clinic Progress Note   PCP: [name], [place] Nephrology: Dr. Royce Macadamia HF Cardiologist: Dr. Aundra Dubin  76 y.o. with history of rheumatoid arthritis, CKD stage 3, and nonischemic cardiomyopathy was referred by Dr. Andrey Cota in [place] to establish heart failure care in [place].  Patient has been known to have a cardiomyopathy since 2015.  Cardiac MRI in 2015 showed EF 37% with no LGE.  Coronary angiography at that time showed no significant coronary disease.  Over the next few years, LV EF fluctuated up and down.  In 3/21, echo showed EF down to 20-25%.  She was admitted to the hospital in [place], [place] in 11/21 with CHF exacerbation.  Echo showed EF 20-25%, global hypokinesis, mildly decreased RV systolic function.  Creatinine was noted to be elevated. She was diuresed and sent home, but was readmitted later in 11/21 with ongoing volume overload. Creatinine was up to 2.89.  She was taken off most of her cardiac meds due to soft BP and elevated creatinine. She was discharged to her daughter's house in [place].   Repeated an echo on her in 12/21, EF < 20% with moderate LV dilation and mildly decreased RV systolic function. RHC was done, surprisingly showing low filling pressures and relatively preserved cardiac index at 2.22.   She was admitted in 3/22 with atrial fibrillation, converted to NSR on amiodarone. She went into atrial flutter after this and had TEE-DCCV in 4/22.  TEE showed EF < 20%, moderate LV dilation, mild RV dilation with moderate RV dysfunction, moderate central MR.  CPX in 3/22 showed severe HF limitation.  PYP scan 4/22 was grade 1 with H/CL 1.48, equivocal.  PYP scan repeated in 3/23 and likely negative.   She was evaluated for [facility] or ANTHEM, BNP too high and thought to be too frail.   Cardiac MRI in 10/22 showed mild LV dilation with EF 23%, normal RV size with RVEF 37%, ECV 34%, small area of subendocardial LGE in the mid inferolateral wall  and small area of full thickness LGE in the basal  inferior wall.   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    Follow up 6/23, BP low and midodrine started. Discussed [place]. Felt to be near end-stage HF.  Admitted 07/08/22 after mechanical fall resulting in left hip fracture. S/P left intramedullary nail 07/10/22.  Post op developed AKI. Given IV fluids . Diuretics adjusted. Echo showed EF improved to 45%. Discharged to [facility].   Today she returns for HF follow up with her daughter.  Currently at [facility]. Overall feeling fine but gets tired after working with therapy. Still with some pain in her left hip. Trying to work with PT. Denies SOB/PND/Orthopnea. Appetite ok. No fever or chills. Weight at SNF 100-102  pounds. Taking all medications provided at SNF. Eventual discharge back to home once therapy completed.    ECG: none ordered today.  Labs (12/21): K 5 => 3.7, creatinine 2.89 => 1.9 => 1.69, BNP 2904 Labs (1/22): K 4, creatinine 1.9, hgb 12.5 Labs (3/22): K 3.6, creatinine 2.36, hgb 11.6, TSH normal Labs (4/22): K 3.7, creatinine 2.25, LFTs normal, urine immunofixation normal, pro-BNP 6498 Labs (7/22): Myeloma panel with IgG monoclonal protein Labs (10/22): K 4.1, creatinine 1.96 Labs (12/22): BNP 549, TSH normal, K 4.3, creatinine 1.78 Labs (1/23): SCr 2.61, K 4.0, Hgb 11.4  Lab (3/23): Scr 2.29, K 3.9  Labs (6/23): K 4.1, creatinine 3.82, normal LFTs,  normal TSH, hgb 9.4 Labs (07/15/22): K 4.4 Creatinine 2.78   PMH: 1. GERD 2. Rheumatoid arthritis: Followed by rheumatology in [place].  - No known pulmonary disease from RA.  3. Osteoporosis. 4. Gout 5. CKD stage 4 6. H/o SVT 7. Chronic systolic CHF: She was found to have nonischemic cardiomyopathy with low EF in 2015.  - LHC (2015): Normal coronaries.  - cardiac MRI (2015): EF 37%, no LGE - Echo (3/21): EF 20-25%. - Echo (11/21): EF 20-25%, moderate LV dilation,  global hypokinesis, biatrial enlargement, mildly decreased RV systolic function.  - Echo (12/21): EF < 20%, moderate LV dilation, normal RV size/systolic function, moderate MR, IVC normal.  - RHC (1/22): Mean RA 1, PA 39/14, mean PCWP 10, PVR 3.9 WU, CI 2.22.  - CPX (3/22): peak VO2 10.1, VE/VCO2 55, RER 1.13.  Severe functional limitation due to HF.  - TEE (4/22): EF < 20%, moderate LV dilation, mild RV dilation with moderate RV dysfunction, moderate central MR. - PYP scan (4/22): grade 1 with H/CL 1.48. Equivocal. [employer] gene testing negative. - Cardiac MRI (10/22): mild LV dilation with EF 23%, normal RV size with RVEF 37%, ECV 34%, small area of subendocardial LGE in the mid inferolateral wall and small area of full thickness LGE in the basal inferior wall.  - PYP scan (3/23): grade 1, H/CL 1.3.  Probably negative.  - Echo 07/11/22: EF 45-50% RV normal  8. Atrial fibrillation/flutter: TEE-DCCV in 4/22.  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  10. Varicella zoster with post-herpetic neuralgia 11. L hip fracture , mechanical fall.   SH: From [place] but recently moved to [place] to live with daughter.  Has 3 children.  Nonsmoker.  No ETOH.   FH: Mother with CHF, father with MI.   ROS: All systems reviewed and negative except as per HPI.   Current Outpatient Medications  Medication Sig Dispense Refill   amiodarone (PACERONE) 200 MG tablet TAKE 1 TABLET BY MOUTH EVERY DAY (Patient taking differently: Take 200 mg by mouth daily.) 60 tablet 6   azaTHIOprine (IMURAN) 50 MG tablet TAKE 2 TABLETS BY MOUTH EVERY DAY (Patient taking differently: Take 100 mg by mouth daily.) 60 tablet 0   bumetanide (BUMEX) 2 MG tablet Take 0.5 tablets (1 mg total) by mouth daily.     calcitRIOL (ROCALTROL) 0.25 MCG capsule Take 0.25 mcg by mouth daily.     Cholecalciferol (VITAMIN D3) 25 MCG (1000 UT) CAPS Take 1 capsule by mouth daily in the afternoon.     cyclobenzaprine (FLEXERIL)  10 MG tablet Take 1 tablet (10 mg total) by mouth 3 (three) times daily as needed for muscle spasms. 30 tablet 0   ELIQUIS 2.5 MG TABS tablet TAKE 1 TABLET BY MOUTH TWICE A DAY (Patient taking differently: Take 2.5 mg by mouth 2 (two) times daily.) 60 tablet 5   Ensure (ENSURE) Take 237 mLs by mouth 2 (two) times daily between meals.     folic acid (FOLVITE) 1 MG tablet TAKE 1 TABLET BY MOUTH EVERY DAY 30 tablet 11   hydroxychloroquine (PLAQUENIL) 200 MG tablet TAKE 1 TABLET BY MOUTH EVERY DAY (Patient taking differently: Take 200 mg by mouth daily.) 30 tablet 5   hydroxypropyl methylcellulose / hypromellose (ISOPTO TEARS / GONIOVISC) 2.5 % ophthalmic solution Place 1 drop into both eyes as needed for dry eyes.     lidocaine (LIDODERM) 5 % Place 1 patch onto the skin daily. Remove & Discard patch within  12 hours or as directed by MD 30 patch 0   Menthol, Topical Analgesic, (ICY HOT BACK EX) Apply 1 application topically daily as needed (pain).     midodrine (PROAMATINE) 5 MG tablet Take 1 tablet (5 mg total) by mouth 3 (three) times daily with meals. 180 tablet 3   Multiple Vitamin (MULTIVITAMIN) tablet Take 1 tablet by mouth daily.     omeprazole (PRILOSEC) 40 MG capsule Take 40 mg by mouth daily.     oxyCODONE (OXY IR/ROXICODONE) 5 MG immediate release tablet Take 1-2 tablets (5-10 mg total) by mouth every 8 (eight) hours as needed for severe pain. 30 tablet 0   polyethylene glycol (MIRALAX / GLYCOLAX) 17 g packet Take 17 g by mouth as needed.     predniSONE (DELTASONE) 5 MG tablet TAKE 1 TABLET BY MOUTH EVERY DAY WITH BREAKFAST (Patient taking differently: Take 5 mg by mouth daily with breakfast.) 30 tablet 1   TUMS E-X 750 750 MG chewable tablet Chew 2 tablets by mouth 3 (three) times daily as needed.     Vericiguat (VERQUVO) 10 MG TABS Take 10 mg by mouth daily. 90 tablet 3   No current facility-administered medications for this encounter.   Wt Readings from Last 3 Encounters:  08/04/22 45.6  kg (100 lb 9.6 oz)  07/15/22 49.9 kg (110 lb 0.2 oz)  07/07/22 42.5 kg (93 lb 12.8 oz)   BP 128/78   Pulse 85   Wt 45.6 kg (100 lb 9.6 oz)   SpO2 99%   BMI 19.01 kg/m   General:  In a wheelchair. No resp difficulty HEENT: normal Neck: supple. no JVD. Carotids 2+ bilat; no bruits. No lymphadenopathy or thryomegaly appreciated. Cor: PMI nondisplaced. Regular rate & rhythm. No rubs, gallops or murmurs. Lungs: clear Abdomen: soft, nontender, nondistended. No hepatosplenomegaly. No bruits or masses. Good bowel sounds. Extremities: no cyanosis, clubbing, rash, edema Neuro: alert & orientedx3, cranial nerves grossly intact. moves all 4 extremities w/o difficulty. Affect pleasant  Assessment/Plan: I reviewed D/C Summary from 07/15/2022   1. Chronic systolic CHF: Nonischemic cardiomyopathy.  This has been known since 2015, cath in 2015 showed no coronary disease and cardiac MRI in 2015 showed no LGE.  Cause is uncertain, familial cardiomyopathy is a concern given nonischemic cardiomyopathy in her mother.  Cannot rule out remote viral myocarditis.Poor appetite and severe HF limitation on CPX in 3/22 is concerning, as is elevated creatinine (suspect cardiorenal syndrome).  Low BP and elevated creatinine have limited her cardiac med regimen. RHC (1/22) showed low filling pressures and actually a relatively preserved cardiac index of 2.22.  PYP scan in 4/22 was equivocal, PYP scan repeated in 3/23 and likely negative.  Cardiac MRI in 10/22 showed mild LV dilation with EF 23%, normal RV size with RVEF 37%, ECV 34%, small area of subendocardial LGE in the mid inferolateral wall and small area of full thickness LGE in the basal inferior wall.   This was not suggestive of cardiac amyloidosis but possibly could be consistent with coronary embolism (though no LV thrombus visualized) versus prior myocarditis versus sarcoidosis.  evere functional limitation due to HF and BP also runs quite low.  - With renal  failure, CHF, and atrial arrhythmias, we have assessed for cardiac amyloidosis.  PYP scan was equivocal in 4/22 for transthyretin cardiac amyloidosis.  It was repeated in 3/23 and likely negative.  [employer] gene testing for TTR mutations was negative. The cardiac MRI was not consistent with amyloidosis.  She has  MGUS with monoclonal IgG paraprotein, but based on cMRI and slow progression, think AL amyloidosis is unlikely.   Had Echo during recent admit. 07/2022 EF now improved 45-50%. RV normal  - Functional class difficult to assess with hip fracture. NYHA II? - Volume status stable. Continue Bumex 1 mg daily  - Continue midodrine 2.5 mg tid. BP improved today. - Continue Verquvo 10 mg daily.  - Off dapagliflozin with rise in creatinine, with low BP would not restart.   - Off spironolactone with elevated creatinine and soft BP.  -  Does not have an ICD,and she is out of range with EF 45-50% .  - Had recent BMET will need to obtain copy.  2. CKD: Stage 4.  She is followed by Dr. Royce Macadamia. -Check BMET  3. Rheumatoid arthritis: No history of lung involvement. She has been on a low dose of prednisone chronically.  4. Atrial fibrillation/flutter: S/p TEE-DCCV in 4/22.  She is now on amiodarone and in NSR.  - Continue amiodarone 200 mg daily. LFTs and TSH normal (11/23).  Will need regular eye exam. Regular on exam.  - Continue Eliquis 5 mg bid.  5. Shingles:Resolved.  6.Left Hip Fracture S/P IM Nail 07/2022 Continue PT    Check BMET     Follow up with Dr Aundra Dubin in 3-4 months with an ECHO.   Waldine Zenz NP-C  08/04/2022

## 2022-08-05 ENCOUNTER — Encounter (HOSPITAL_COMMUNITY): Payer: Medicare PPO

## 2022-08-06 ENCOUNTER — Other Ambulatory Visit: Payer: Self-pay | Admitting: *Deleted

## 2022-08-06 DIAGNOSIS — L97501 Non-pressure chronic ulcer of other part of unspecified foot limited to breakdown of skin: Secondary | ICD-10-CM

## 2022-08-10 NOTE — Progress Notes (Unsigned)
VASC[facility] VEIN SPECIALISTS OF [place]  ASSESSMENT / PLAN: Tracy Nelson is a 77 y.o. female with right decubitus heel ulceration with fat layer exposed.  The patient has no evidence of hemodynamically significant peripheral arterial disease on clinical exam or ankle-brachial index.  I think she will heal this ulcer with meticulous wound care.  I have referred her to [facility] for the same.  She can follow-up with me on an as-needed basis.  CHIEF COMPLAINT: Right heel ulcer  HISTORY OF PRESENT ILLNESS: Tracy Nelson is a 77 y.o. female who is currently recovering from a hip fracture in a nursing facility.  The patient developed decubitus heel ulceration on her left heel which spontaneously resolved.  She now has ulceration on her right posterior heel.  This appears consistent with decubitus ulceration.  There is exposed, fibrinous fat.  The ulcer is quite painful to the patient.  Prior to her fall, the patient was ambulatory and fairly normal.  Per her report she has lost about 30 pounds over the last several months.  Past Medical History:  Diagnosis Date   Acid reflux 07/15/2020   Arthritis 07/15/2020   CHF (congestive heart failure) ([place])    Colitis 07/15/2020   Dilated cardiomyopathy ([place]) 07/15/2020   HFrEF (heart failure with reduced ejection fraction) ([place]) 07/15/2020   HTN (hypertension) 07/15/2020   Osteoporosis 07/15/2020   Palpitations 07/15/2020   Rheumatoid arthritis ([place]) 07/15/2020    Past Surgical History:  Procedure Laterality Date   CARDIOVERSION N/A 11/08/2020   Procedure: CARDIOVERSION;  Surgeon: [name], MD;  Location: [facility];  Service: Cardiovascular;  Laterality: N/A;   INTRAMEDULLARY (IM) NAIL INTERTROCHANTERIC Left 07/10/2022   Procedure: INTRAMEDULLARY (IM) NAIL INTERTROCHANTERIC;  Surgeon: Leandrew Koyanagi, MD;  Location: [facility];  Service: Orthopedics;  Laterality: Left;   RIGHT HEART CATH N/A 08/14/2020   Procedure: RIGHT HEART CATH;   Surgeon: [name], MD;  Location: [facility];  Service: Cardiovascular;  Laterality: N/A;   TEE WITHOUT CARDIOVERSION N/A 11/08/2020   Procedure: TRANSESOPHAGEAL ECHOCARDIOGRAM (TEE);  Surgeon: [name], MD;  Location: [facility];  Service: Cardiovascular;  Laterality: N/A;    Family History  Problem Relation Age of Onset   Hypertension Mother    Diabetes Mother    Hypertension Father    Diabetes Father    Breast cancer Maternal Aunt    Breast cancer Paternal Aunt    Arthritis Maternal Grandmother    Lung disease Paternal Grandfather    Cancer Brother     Social History   Socioeconomic History   Marital status: Widowed    Spouse name: Not on file   Number of children: Not on file   Years of education: 16   Highest education level: Bachelor's degree (e.g., BA, AB, BS)  Occupational History   Occupation: Retired  Tobacco Use   Smoking status: Never   Smokeless tobacco: Never  Vaping Use   Vaping Use: Never used  Substance and Sexual Activity   Alcohol use: Never   Drug use: Never   Sexual activity: Not Currently  Other Topics Concern   Not on file  Social History Narrative   Not on file   Social Determinants of Health   Financial Resource Strain: Low Risk  (04/30/2022)   Overall Financial Resource Strain (CARDIA)    Difficulty of Paying Living Expenses: Not hard at all  Food Insecurity: No Food Insecurity (07/09/2022)   Hunger Vital Sign  Worried About Charity fundraiser in the Last Year: Never true    [place] in the Last Year: Never true  Transportation Needs: Unmet Transportation Needs (07/09/2022)   PRAPARE - Hydrologist (Medical): Yes    Lack of Transportation (Non-Medical): Yes  Physical Activity: Inactive (04/30/2022)   Exercise Vital Sign    Days of Exercise per Week: 0 days    Minutes of Exercise per Session: 0 min  Stress: No Stress Concern Present (04/30/2022)   [place]    Feeling of Stress : Not at all  Social Connections: Not on file  Intimate Partner Violence: Not At Risk (07/09/2022)   Humiliation, Afraid, Rape, and Kick questionnaire    Fear of Current or Ex-Partner: No    Emotionally Abused: No    Physically Abused: No    Sexually Abused: No    Allergies  Allergen Reactions   Baclofen Nausea And Vomiting   Penicillin G     Other reaction(s): Unknown   Penicillins Rash    Current Outpatient Medications  Medication Sig Dispense Refill   amiodarone (PACERONE) 200 MG tablet TAKE 1 TABLET BY MOUTH EVERY DAY 60 tablet 6   azaTHIOprine (IMURAN) 50 MG tablet TAKE 2 TABLETS BY MOUTH EVERY DAY 60 tablet 0   bumetanide (BUMEX) 1 MG tablet Take 1 mg by mouth daily.     calcitRIOL (ROCALTROL) 0.25 MCG capsule Take 0.25 mcg by mouth daily.     Cholecalciferol (VITAMIN D3) 25 MCG (1000 UT) CAPS Take 1 capsule by mouth daily in the afternoon.     cyclobenzaprine (FLEXERIL) 10 MG tablet Take 1 tablet (10 mg total) by mouth 3 (three) times daily as needed for muscle spasms. 30 tablet 0   ELIQUIS 2.5 MG TABS tablet TAKE 1 TABLET BY MOUTH TWICE A DAY 60 tablet 5   Ensure (ENSURE) Take 237 mLs by mouth 2 (two) times daily between meals.     folic acid (FOLVITE) 1 MG tablet TAKE 1 TABLET BY MOUTH EVERY DAY 30 tablet 11   hydroxychloroquine (PLAQUENIL) 200 MG tablet TAKE 1 TABLET BY MOUTH EVERY DAY 30 tablet 5   hydroxypropyl methylcellulose / hypromellose (ISOPTO TEARS / GONIOVISC) 2.5 % ophthalmic solution Place 1 drop into both eyes as needed for dry eyes.     lidocaine (LIDODERM) 5 % Place 1 patch onto the skin daily. Remove & Discard patch within 12 hours or as directed by MD 30 patch 0   Menthol, Topical Analgesic, (ICY HOT BACK EX) Apply 1 application topically daily as needed (pain).     midodrine (PROAMATINE) 5 MG tablet Take 1 tablet (5 mg total) by mouth 3 (three) times daily with meals. 180 tablet  3   Multiple Vitamin (MULTIVITAMIN) tablet Take 1 tablet by mouth daily.     omeprazole (PRILOSEC) 40 MG capsule Take 40 mg by mouth daily.     oxyCODONE (OXY IR/ROXICODONE) 5 MG immediate release tablet Take 1-2 tablets (5-10 mg total) by mouth every 8 (eight) hours as needed for severe pain. 30 tablet 0   polyethylene glycol (MIRALAX / GLYCOLAX) 17 g packet Take 17 g by mouth as needed.     predniSONE (DELTASONE) 5 MG tablet TAKE 1 TABLET BY MOUTH EVERY DAY WITH BREAKFAST 30 tablet 1   TUMS E-X 750 750 MG chewable tablet Chew 2 tablets by mouth 3 (three) times daily as needed.  Vericiguat (VERQUVO) 10 MG TABS Take 10 mg by mouth daily. 90 tablet 3   No current facility-administered medications for this visit.    PHYSICAL EXAM There were no vitals filed for this visit.  Elderly woman in no acute distress Regular rate and rhythm Unlabored breathing 2+ dorsalis pedis pulses bilaterally Right heel ulceration with fat layer exposed measuring about a half dollar   PERTINENT LABORATORY AND RADIOLOGIC DATA  Most recent CBC    Latest Ref Rng & Units 07/15/2022    3:33 AM 07/13/2022    4:05 AM 07/12/2022    2:00 AM  CBC  WBC 4.0 - 10.5 K/uL 7.3  8.3  8.7   Hemoglobin 12.0 - 15.0 g/dL 7.9  9.6  9.3   Hematocrit 36.0 - 46.0 % 24.7  29.8  28.6   Platelets 150 - 400 K/uL 247  246  228      Most recent CMP    Latest Ref Rng & Units 07/15/2022    3:33 AM 07/14/2022    8:06 AM 07/13/2022    4:05 AM  CMP  Glucose 70 - 99 mg/dL 137  89  152   BUN 8 - 23 mg/dL 48  44  56   Creatinine 0.44 - 1.00 mg/dL 2.78  2.67  3.11   Sodium 135 - 145 mmol/L 138  139  142   Potassium 3.5 - 5.1 mmol/L 4.4  4.5  4.5   Chloride 98 - 111 mmol/L 109  112  117   CO2 22 - 32 mmol/L '18  18  16   '$ Calcium 8.9 - 10.3 mg/dL 8.8  8.7  8.6     Renal function CrCl cannot be calculated (Patient's most recent lab result is older than the maximum 21 days allowed.).  Hgb A1c MFr Bld (%)  Date Value  03/09/2022  5.3    LDL Chol Calc (NIH)  Date Value Ref Range Status  03/09/2022 95 0 - 99 mg/dL Final     Vascular Imaging:  +-------+-----------+-----------+------------+------------+  ABI/TBIToday's ABIToday's TBIPrevious ABIPrevious TBI  +-------+-----------+-----------+------------+------------+  Right [place]         0.77                                 +-------+-----------+-----------+------------+------------+  Left  [place]         0.77                                 +-------+-----------+-----------+------------+------------+   Toe pressure 129 on the right / 130 on the left  [employer]. Stanford Breed, MD [facility] of [facility] Phone Number: [phone] 08/10/2022 12:17 PM   Total time spent on preparing this encounter including chart review, data review, collecting history, examining the patient, coordinating care for this new patient, 60 minutes.  Portions of this report may have been transcribed using voice recognition software.  Every effort has been made to ensure accuracy; however, inadvertent computerized transcription errors may still be present.

## 2022-08-11 ENCOUNTER — Ambulatory Visit: Payer: Medicare PPO | Admitting: Vascular Surgery

## 2022-08-11 ENCOUNTER — Encounter: Payer: Self-pay | Admitting: Vascular Surgery

## 2022-08-11 ENCOUNTER — Ambulatory Visit (HOSPITAL_COMMUNITY)
Admission: RE | Admit: 2022-08-11 | Discharge: 2022-08-11 | Disposition: A | Discharge status: Home / Self Care | Payer: Medicare PPO | Source: Ambulatory Visit | Attending: Vascular Surgery | Admitting: Vascular Surgery

## 2022-08-11 VITALS — BP 132/84 | HR 76 | Temp 97.6°F | Resp 20 | Ht 61.0 in | Wt 100.0 lb

## 2022-08-11 DIAGNOSIS — L89613 Pressure ulcer of right heel, stage 3: Secondary | ICD-10-CM

## 2022-08-11 DIAGNOSIS — L97501 Non-pressure chronic ulcer of other part of unspecified foot limited to breakdown of skin: Secondary | ICD-10-CM | POA: Diagnosis present

## 2022-08-18 ENCOUNTER — Ambulatory Visit: Payer: Medicare PPO | Admitting: Podiatry

## 2022-08-18 ENCOUNTER — Ambulatory Visit: Payer: Medicare PPO | Admitting: Orthopaedic Surgery

## 2022-08-21 ENCOUNTER — Telehealth: Payer: Self-pay

## 2022-08-21 ENCOUNTER — Ambulatory Visit: Payer: Medicare PPO | Admitting: Podiatry

## 2022-08-21 NOTE — Telephone Encounter (Signed)
Transition Care Management Follow-up Telephone Call Date of discharge and from where: Tracy Nelson 08/20/2022 How have you been since you were released from the hospital? Having pain and swelling in left hip Any questions or concerns? Yes  Items Reviewed: Did the pt receive and understand the discharge instructions provided? Yes  Medications obtained and verified? Yes  Other? No  Any new allergies since your discharge? No  Dietary orders reviewed? Yes Do you have support at home? Yes   Home Care and Equipment/Supplies: Were home health services ordered? yes If so, what is the name of the agency? unknown  Has the agency set up a time to come to the patient's home? yes Were any new equipment or medical supplies ordered?  Yes: wheelchair bedside commode walker What is the name of the medical supply agency? unknown Were you able to get the supplies/equipment? yes Do you have any questions related to the use of the equipment or supplies? No  Functional Questionnaire: (I = Independent and D = Dependent) ADLs: I  Bathing/Dressing- I  Meal Prep- I  Eating- I  Maintaining continence- D  Transferring/Ambulation- D  Managing Meds- I  Follow up appointments reviewed:  PCP Hospital f/u appt confirmed? Yes  Scheduled to see [name] on 08/27/2022 @ 3:40. [facility] f/u appt confirmed? No  Patient to schedule Are transportation arrangements needed? No  If their condition worsens, is the pt aware to call PCP or go to the Emergency Dept.? Yes Was the patient provided with contact information for the PCP's office or ED? Yes Was to pt encouraged to call back with questions or concerns? Yes .lh

## 2022-08-27 ENCOUNTER — Ambulatory Visit (INDEPENDENT_AMBULATORY_CARE_PROVIDER_SITE_OTHER): Payer: Medicare PPO | Admitting: Nurse Practitioner

## 2022-08-27 ENCOUNTER — Encounter: Payer: Self-pay | Admitting: Nurse Practitioner

## 2022-08-27 VITALS — BP 104/60 | HR 97 | Temp 98.6°F

## 2022-08-27 DIAGNOSIS — S91331A Puncture wound without foreign body, right foot, initial encounter: Secondary | ICD-10-CM | POA: Diagnosis not present

## 2022-08-27 DIAGNOSIS — S72002D Fracture of unspecified part of neck of left femur, subsequent encounter for closed fracture with routine healing: Secondary | ICD-10-CM | POA: Diagnosis not present

## 2022-08-27 DIAGNOSIS — R0602 Shortness of breath: Secondary | ICD-10-CM

## 2022-08-27 DIAGNOSIS — I5020 Unspecified systolic (congestive) heart failure: Secondary | ICD-10-CM

## 2022-08-27 DIAGNOSIS — D649 Anemia, unspecified: Secondary | ICD-10-CM | POA: Diagnosis not present

## 2022-08-27 DIAGNOSIS — R220 Localized swelling, mass and lump, head: Secondary | ICD-10-CM | POA: Diagnosis not present

## 2022-08-27 NOTE — Patient Instructions (Signed)
Understanding Your Risk for Falls Each year, millions of people have serious injuries from falls. It is important to understand your risk for falling. Talk with your health care provider about your risk and what you can do to lower it. There are actions you can take at home to lower your risk and prevent falls. If you do have a serious fall, make sure to tell your health care provider. Falling once raises your risk of falling again. How can falls affect me? Serious injuries from falls are common. These include: Broken bones, such as hip fractures. Head injuries, such as traumatic brain injuries (TBI) or concussion. A fear of falling can cause you to avoid activities and stay at home. This can make your muscles weaker and actually raise your risk for a fall. What can increase my risk? There are a number of risk factors that increase your risk for falling. The more risk factors you have, the higher your risk of falling. Serious injuries from a fall happen most often to people older than age 78. Children and young adults ages 38-29 are also at higher risk. Common risk factors include: Weakness in the lower body. Lack (deficiency) of vitamin D. Being generally weak or confused due to long-term (chronic) illness. Dizziness or balance problems. Poor vision. Medicines that cause dizziness or drowsiness. These can include medicines for your blood pressure, heart, anxiety, insomnia, or edema, as well as pain medicines and muscle relaxants. Other risk factors include: Drinking alcohol. Having had a fall in the past. Having depression. Having foot pain or wearing improper footwear. Working at a dangerous job. Having any of the following in your home: Tripping hazards, such as floor clutter or loose rugs. Poor lighting. Pets. Having dementia or memory loss. What actions can I take to lower my risk of falling?     Physical activity Maintain physical fitness. Do strength and balance exercises.  Consider taking a regular class to build strength and balance. Yoga and tai chi are good options. Vision Have your eyes checked every year and your vision prescription updated as needed. Walking aids and footwear Wear nonskid shoes. Do not wear high heels. Do not walk around the house in socks or slippers. Use a cane or walker as told by your health care provider. Home safety Attach secure railings on both sides of your stairs. Install grab bars for your tub, shower, and toilet. Use a bath mat in your tub or shower. Use good lighting in all rooms. Keep a flashlight near your bed. Make sure there is a clear path from your bed to the bathroom. Use night-lights. Do not use throw rugs. Make sure all carpeting is taped or tacked down securely. Remove all clutter from walkways and stairways, including extension cords. Repair uneven or broken steps. Avoid walking on icy or slippery surfaces. Walk on the grass instead of on icy or slick sidewalks. Use ice melt to get rid of ice on walkways. Use a cordless phone. Questions to ask your health care provider Can you help me check my risk for a fall? Do any of my medicines make me more likely to fall? Should I take a vitamin D supplement? What exercises can I do to improve my strength and balance? Should I make an appointment to have my vision checked? Do I need a bone density test to check for weak bones or osteoporosis? Would it help to use a cane or a walker? Where to find more information Centers for Disease Control and Prevention,  [place]: www.cdc.gov Community-Based Fall Prevention Programs: www.cdc.gov National Institute on Aging: www.nia.nih.gov Contact a health care provider if: You fall at home. You are afraid of falling at home. You feel weak, drowsy, or dizzy. Summary Serious injuries from a fall happen most often to people older than age 65. Children and young adults ages 15-29 are also at higher risk. Talk with your health care  provider about your risks for falling and how to lower those risks. Taking certain precautions at home can lower your risk for falling. If you fall, always tell your health care provider. This information is not intended to replace advice given to you by your health care provider. Make sure you discuss any questions you have with your health care provider. Document Revised: 02/28/2020 Document Reviewed: 02/28/2020 Elsevier Patient Education  2023 Elsevier Inc.  

## 2022-08-27 NOTE — Progress Notes (Addendum)
I,Tracy Nelson,acting as a Education administrator for [employer], [name].,have documented all relevant documentation on the behalf of Tracy Brine, [name],as directed by  Tracy Brine, [name] while in the presence of Tracy Nelson, [place].  Subjective:     [name] , female    DOB: 11-Nov-1945 , 77 y.o.   MRN: [id number]   Chief Complaint  Patient presents with   Hospitalization Follow-up    HPI  She is here for hospital f/u after suffering a fall and fracturing her trochanter on left hip. She has a history significant for systolic heart failure ejection fraction less than 20%, nonischemic cardiomyopathy, rheumatoid arthritis on immunosuppressants.  She was seen by Dr. Phoebe Sharps orthopedic for surgery on 12/1 after Eliquis washout.  She was discharged to a skilled nursing facility.  After surgery she developed acute kidney injury on chronic kidney disease.  She did receive IV fluids and her creatinine improved.  Her Bumex was decreased to 1 mg p.o. daily.  She does have a history of nonischemic cardiomyopathy with a prior EF of less than 20%.  Her repeat echo during hospitalization showed improvement of her EF to 45%.  She is using her walker to get around. She has had a nurse to come out for the initial visit last week from [place]. She has been home for one week.   She has been having shortness of breath on and off prior to coming to the office. She is weak since yesterday. She has puffiness to her face. She wears dentures. Denies ear pain she feels like her abdomen is swelling but reports she is constipated. She is drinking the small water bottles about 12 oz. She did see the nephrologist last week.      Past Medical History:  Diagnosis Date   Acid reflux 07/15/2020   Arthritis 07/15/2020   CHF (congestive heart failure) ([place])    Colitis 07/15/2020   Dilated cardiomyopathy ([place]) 07/15/2020   HFrEF (heart failure with reduced ejection fraction) ([place]) 07/15/2020   HTN (hypertension) 07/15/2020    Osteoporosis 07/15/2020   Palpitations 07/15/2020   Rheumatoid arthritis ([place]) 07/15/2020     Family History  Problem Relation Age of Onset   Hypertension Mother    Diabetes Mother    Hypertension Father    Diabetes Father    Breast cancer Maternal Aunt    Breast cancer Paternal Aunt    Arthritis Maternal Grandmother    Lung disease Paternal Grandfather    Cancer Brother     No current facility-administered medications for this visit. No current outpatient medications on file.  Facility-Administered Medications Ordered in Other Visits:    acetaminophen (TYLENOL) tablet 650 mg, 650 mg, Oral, Q6H PRN, 650 mg at 08/31/22 0823 **OR** acetaminophen (TYLENOL) suppository 650 mg, 650 mg, Rectal, Q6H PRN, Zada Finders R, MD   amiodarone (PACERONE) tablet 200 mg, 200 mg, Oral, Daily, Posey Pronto, Vishal R, MD, 200 mg at 08/31/22 [id number]   azaTHIOprine (IMURAN) tablet 100 mg, 100 mg, Oral, Daily, Zada Finders R, MD, 100 mg at 0000000 [id number]   folic acid (FOLVITE) tablet 1 mg, 1 mg, Oral, Daily, Posey Pronto, Roxanne Mins R, MD, 1 mg at 08/31/22 [id number]   heparin ADULT infusion 100 units/mL (25000 units/244m), 800 Units/hr, Intravenous, Continuous, LRegan Lemming MD, Last Rate: 8 mL/hr at 08/30/22 2126, 800 Units/hr at 08/30/22 2126   hydrocerin (EUCERIN) cream, , Topical, Daily, JDomenic Polite MD, Given at 08/31/22 1301   hydroxychloroquine (PLAQUENIL) tablet 200 mg, 200 mg, Oral, Daily, PPosey Pronto  Vishal R, MD, 200 mg at 08/31/22 0835   lidocaine (LIDODERM) 5 % 1 patch, 1 patch, Transdermal, Q24H, Regan Lemming, MD, 1 patch at 08/31/22 0824   midodrine (PROAMATINE) tablet 5 mg, 5 mg, Oral, TID WC, Patel, Vishal R, MD, 5 mg at 08/31/22 1637   ondansetron (ZOFRAN) tablet 4 mg, 4 mg, Oral, Q6H PRN **OR** ondansetron (ZOFRAN) injection 4 mg, 4 mg, Intravenous, Q6H PRN, Posey Pronto, Vishal R, MD   predniSONE (DELTASONE) tablet 5 mg, 5 mg, Oral, Q breakfast, Posey Pronto, Roxanne Mins R, MD, 5 mg at 08/31/22 [id number]   senna-docusate (Senokot-S)  tablet 1 tablet, 1 tablet, Oral, QHS PRN, Zada Finders R, MD   sodium bicarbonate tablet 650 mg, 650 mg, Oral, Daily, Zada Finders R, MD, 650 mg at 08/31/22 [id number]   sodium chloride flush (NS) 0.9 % injection 3 mL, 3 mL, Intravenous, Q12H, Zada Finders R, MD, 3 mL at 08/31/22 [id number]   Vericiguat TABS 10 mg, 10 mg, Oral, Daily, Zada Finders R, MD   Allergies  Allergen Reactions   Baclofen Nausea And Vomiting   Penicillin G     Other reaction(s): Unknown   Penicillins Rash     Review of Systems  Constitutional:  Positive for fatigue.  Respiratory:  Positive for shortness of breath.   Cardiovascular: Negative.   Gastrointestinal: Negative.   Musculoskeletal:  Positive for arthralgias.  Neurological: Negative.      Today's Vitals   08/27/22 1549  BP: 104/60  Pulse: 97  Temp: 98.6 F (37 C)  TempSrc: Oral  SpO2: 90%   There is no height or weight on file to calculate BMI.   Objective:  Physical Exam Vitals reviewed.  Constitutional:      General: She is not in acute distress.    Appearance: Normal appearance.  Cardiovascular:     Rate and Rhythm: Normal rate and regular rhythm.     Pulses: Normal pulses.     Heart sounds: Normal heart sounds. No murmur heard. Pulmonary:     Effort: Pulmonary effort is normal. No respiratory distress.     Breath sounds: Normal breath sounds. No wheezing.  Skin:    General: Skin is warm and dry.     Capillary Refill: Capillary refill takes less than 2 seconds.     Comments: Right heel wound with open area and darkened linear area. Cleansed with NS and applied nonstick dressing  Neurological:     General: No focal deficit present.     Mental Status: She is alert and oriented to person, place, and time.     Cranial Nerves: No cranial nerve deficit.     Motor: No weakness.  Psychiatric:        Mood and Affect: Mood normal.        Behavior: Behavior normal.        Thought Content: Thought content normal.        Judgment: Judgment  normal.         Assessment And Plan:     1. Closed fracture of left hip with routine healing, subsequent encounter She had hip surgery, doing well with the fracture. TCM Performed. A member of the clinical team spoke with the patient upon dischare. Discharge summary was reviewed in full detail during the visit. Meds reconciled and compared to discharge meds. Medication list is updated and reviewed with the patient.  Greater than 50% face to face time was spent in counseling an coordination of care.  All questions were answered to the  satisfaction of the patient.    2. Facial swelling Comments: I am concerned her facial swelling can be related to CHF, will check BNP. Daughter feels is possibly related to the foods she at at the Nursing home.  - CBC - Brain natriuretic peptide  3. Shortness of breath Comments: She has shortness of breath upon arriving to the office.  Applied vision at 2 L/min SpO2 increased to 98% after removing oxygen was 100% with oxygen  4. HFrEF (heart failure with reduced ejection fraction) ([place]) Comments: She has seen Cardiology. - BMP8+eGFR  5. Anemia, unspecified type Comments: Will recheck her Hgb levels. - CBC  6. Penetrating wound of right foot, initial encounter Comments: right heal has open area with some darkened linear area.  Minimal discharge.  Cleanse with normal saline and applied nonstick dressing.  Will also send message       Patient was given opportunity to ask questions. Patient verbalized understanding of the plan and was able to repeat key elements of the plan. All questions were answered to their satisfaction.  Tracy Brine, [name]    I, Tracy Brine, [name], have reviewed all documentation for this visit. The documentation on 08/27/22 for the exam, diagnosis, procedures, and orders are all accurate and complete.  IF YOU HAVE BEEN REFERRED TO A SPECIALIST, IT MAY TAKE 1-2 WEEKS TO SCHEDULE/PROCESS THE REFERRAL. IF YOU HAVE NOT HEARD FROM  US/SPECIALIST IN TWO WEEKS, PLEASE GIVE [place] A CALL AT [phone] X [phone].   THE PATIENT IS ENCOURAGED TO PRACTICE SOCIAL DISTANCING DUE TO THE COVID-19 PANDEMIC.

## 2022-08-28 ENCOUNTER — Other Ambulatory Visit (HOSPITAL_COMMUNITY): Payer: Self-pay

## 2022-08-29 LAB — BMP8+EGFR
BUN/Creatinine Ratio: 15 (ref 12–28)
BUN: 37 mg/dL — ABNORMAL HIGH (ref 8–27)
CO2: 17 mmol/L — ABNORMAL LOW (ref 20–29)
Calcium: 9.1 mg/dL (ref 8.7–10.3)
Chloride: 108 mmol/L — ABNORMAL HIGH (ref 96–106)
Creatinine, Ser: 2.46 mg/dL — ABNORMAL HIGH (ref 0.57–1.00)
Glucose: 123 mg/dL — ABNORMAL HIGH (ref 70–99)
Potassium: 4 mmol/L (ref 3.5–5.2)
Sodium: 143 mmol/L (ref 134–144)
eGFR: 20 mL/min/{1.73_m2} — ABNORMAL LOW (ref >59)

## 2022-08-29 LAB — BRAIN NATRIURETIC PEPTIDE: BNP: >5000 pg/mL — AB (ref 0.0–100.0)

## 2022-08-29 LAB — CBC
Hematocrit: 22.2 % — ABNORMAL LOW (ref 34.0–46.6)
Hemoglobin: 7.3 g/dL — ABNORMAL LOW (ref 11.1–15.9)
MCH: 30 pg (ref 26.6–33.0)
MCHC: 32.9 g/dL (ref 31.5–35.7)
MCV: 91 fL (ref 79–97)
NRBC: 1 % — ABNORMAL HIGH (ref 0–0)
Platelets: 286 10*3/uL (ref 150–450)
RBC: 2.43 x10E6/uL — CL (ref 3.77–5.28)
RDW: 18 % — ABNORMAL HIGH (ref 11.7–15.4)
WBC: 7 10*3/uL (ref 3.4–10.8)

## 2022-08-30 ENCOUNTER — Inpatient Hospital Stay (HOSPITAL_COMMUNITY)
Admission: EM | Admit: 2022-08-30 | Discharge: 2022-09-11 | DRG: 286 | Disposition: A | Discharge status: Home Health | Payer: Medicare PPO | Attending: Internal Medicine | Admitting: Internal Medicine

## 2022-08-30 ENCOUNTER — Other Ambulatory Visit: Payer: Self-pay

## 2022-08-30 ENCOUNTER — Encounter (HOSPITAL_COMMUNITY): Payer: Self-pay | Admitting: Pharmacy Technician

## 2022-08-30 ENCOUNTER — Emergency Department (HOSPITAL_COMMUNITY): Payer: Medicare PPO

## 2022-08-30 DIAGNOSIS — I5021 Acute systolic (congestive) heart failure: Secondary | ICD-10-CM | POA: Diagnosis present

## 2022-08-30 DIAGNOSIS — D539 Nutritional anemia, unspecified: Secondary | ICD-10-CM | POA: Diagnosis present

## 2022-08-30 DIAGNOSIS — E861 Hypovolemia: Secondary | ICD-10-CM | POA: Diagnosis not present

## 2022-08-30 DIAGNOSIS — M0579 Rheumatoid arthritis with rheumatoid factor of multiple sites without organ or systems involvement: Secondary | ICD-10-CM | POA: Diagnosis present

## 2022-08-30 DIAGNOSIS — I959 Hypotension, unspecified: Secondary | ICD-10-CM | POA: Diagnosis not present

## 2022-08-30 DIAGNOSIS — E876 Hypokalemia: Secondary | ICD-10-CM | POA: Diagnosis not present

## 2022-08-30 DIAGNOSIS — E8722 Chronic metabolic acidosis: Secondary | ICD-10-CM | POA: Diagnosis present

## 2022-08-30 DIAGNOSIS — J90 Pleural effusion, not elsewhere classified: Secondary | ICD-10-CM | POA: Diagnosis present

## 2022-08-30 DIAGNOSIS — L89616 Pressure-induced deep tissue damage of right heel: Secondary | ICD-10-CM | POA: Diagnosis present

## 2022-08-30 DIAGNOSIS — I5023 Acute on chronic systolic (congestive) heart failure: Secondary | ICD-10-CM | POA: Diagnosis present

## 2022-08-30 DIAGNOSIS — D631 Anemia in chronic kidney disease: Secondary | ICD-10-CM | POA: Diagnosis present

## 2022-08-30 DIAGNOSIS — I2729 Other secondary pulmonary hypertension: Secondary | ICD-10-CM | POA: Diagnosis present

## 2022-08-30 DIAGNOSIS — I5082 Biventricular heart failure: Secondary | ICD-10-CM | POA: Diagnosis present

## 2022-08-30 DIAGNOSIS — Z96698 Presence of other orthopedic joint implants: Secondary | ICD-10-CM | POA: Diagnosis present

## 2022-08-30 DIAGNOSIS — Z7952 Long term (current) use of systemic steroids: Secondary | ICD-10-CM

## 2022-08-30 DIAGNOSIS — D638 Anemia in other chronic diseases classified elsewhere: Secondary | ICD-10-CM | POA: Diagnosis not present

## 2022-08-30 DIAGNOSIS — Z880 Allergy status to penicillin: Secondary | ICD-10-CM

## 2022-08-30 DIAGNOSIS — N184 Chronic kidney disease, stage 4 (severe): Secondary | ICD-10-CM | POA: Diagnosis present

## 2022-08-30 DIAGNOSIS — I5084 End stage heart failure: Secondary | ICD-10-CM | POA: Diagnosis present

## 2022-08-30 DIAGNOSIS — Z8619 Personal history of other infectious and parasitic diseases: Secondary | ICD-10-CM

## 2022-08-30 DIAGNOSIS — R54 Age-related physical debility: Secondary | ICD-10-CM | POA: Diagnosis present

## 2022-08-30 DIAGNOSIS — I083 Combined rheumatic disorders of mitral, aortic and tricuspid valves: Secondary | ICD-10-CM | POA: Diagnosis present

## 2022-08-30 DIAGNOSIS — D509 Iron deficiency anemia, unspecified: Secondary | ICD-10-CM | POA: Diagnosis present

## 2022-08-30 DIAGNOSIS — Z7960 Long term (current) use of unspecified immunomodulators and immunosuppressants: Secondary | ICD-10-CM

## 2022-08-30 DIAGNOSIS — Z7901 Long term (current) use of anticoagulants: Secondary | ICD-10-CM

## 2022-08-30 DIAGNOSIS — I509 Heart failure, unspecified: Principal | ICD-10-CM

## 2022-08-30 DIAGNOSIS — Z79899 Other long term (current) drug therapy: Secondary | ICD-10-CM

## 2022-08-30 DIAGNOSIS — R7989 Other specified abnormal findings of blood chemistry: Secondary | ICD-10-CM | POA: Insufficient documentation

## 2022-08-30 DIAGNOSIS — Z7189 Other specified counseling: Secondary | ICD-10-CM | POA: Diagnosis not present

## 2022-08-30 DIAGNOSIS — L8990 Pressure ulcer of unspecified site, unspecified stage: Secondary | ICD-10-CM | POA: Diagnosis present

## 2022-08-30 DIAGNOSIS — I5022 Chronic systolic (congestive) heart failure: Secondary | ICD-10-CM

## 2022-08-30 DIAGNOSIS — Z8249 Family history of ischemic heart disease and other diseases of the circulatory system: Secondary | ICD-10-CM

## 2022-08-30 DIAGNOSIS — R599 Enlarged lymph nodes, unspecified: Secondary | ICD-10-CM | POA: Diagnosis present

## 2022-08-30 DIAGNOSIS — I428 Other cardiomyopathies: Secondary | ICD-10-CM | POA: Diagnosis not present

## 2022-08-30 DIAGNOSIS — Z515 Encounter for palliative care: Secondary | ICD-10-CM | POA: Diagnosis not present

## 2022-08-30 DIAGNOSIS — N179 Acute kidney failure, unspecified: Secondary | ICD-10-CM | POA: Diagnosis not present

## 2022-08-30 DIAGNOSIS — I4892 Unspecified atrial flutter: Secondary | ICD-10-CM | POA: Diagnosis present

## 2022-08-30 DIAGNOSIS — I420 Dilated cardiomyopathy: Secondary | ICD-10-CM | POA: Diagnosis present

## 2022-08-30 DIAGNOSIS — Z803 Family history of malignant neoplasm of breast: Secondary | ICD-10-CM

## 2022-08-30 DIAGNOSIS — R053 Chronic cough: Secondary | ICD-10-CM | POA: Diagnosis present

## 2022-08-30 DIAGNOSIS — I130 Hypertensive heart and chronic kidney disease with heart failure and stage 1 through stage 4 chronic kidney disease, or unspecified chronic kidney disease: Secondary | ICD-10-CM | POA: Diagnosis present

## 2022-08-30 DIAGNOSIS — Z1152 Encounter for screening for COVID-19: Secondary | ICD-10-CM

## 2022-08-30 DIAGNOSIS — Z8261 Family history of arthritis: Secondary | ICD-10-CM

## 2022-08-30 DIAGNOSIS — I480 Paroxysmal atrial fibrillation: Secondary | ICD-10-CM | POA: Diagnosis present

## 2022-08-30 DIAGNOSIS — D84821 Immunodeficiency due to drugs: Secondary | ICD-10-CM | POA: Diagnosis present

## 2022-08-30 DIAGNOSIS — E871 Hypo-osmolality and hyponatremia: Secondary | ICD-10-CM | POA: Diagnosis not present

## 2022-08-30 DIAGNOSIS — Z833 Family history of diabetes mellitus: Secondary | ICD-10-CM

## 2022-08-30 DIAGNOSIS — L84 Corns and callosities: Secondary | ICD-10-CM | POA: Diagnosis present

## 2022-08-30 DIAGNOSIS — M810 Age-related osteoporosis without current pathological fracture: Secondary | ICD-10-CM | POA: Diagnosis present

## 2022-08-30 DIAGNOSIS — Z881 Allergy status to other antibiotic agents status: Secondary | ICD-10-CM

## 2022-08-30 DIAGNOSIS — R531 Weakness: Secondary | ICD-10-CM | POA: Diagnosis not present

## 2022-08-30 LAB — CBC WITH DIFFERENTIAL/PLATELET
Abs Immature Granulocytes: 0.14 10*3/uL — ABNORMAL HIGH (ref 0.00–0.07)
Basophils Absolute: 0 10*3/uL (ref 0.0–0.1)
Basophils Relative: 0 %
Eosinophils Absolute: 0 10*3/uL (ref 0.0–0.5)
Eosinophils Relative: 0 %
HCT: 23.7 % — ABNORMAL LOW (ref 36.0–46.0)
Hemoglobin: 7.3 g/dL — ABNORMAL LOW (ref 12.0–15.0)
Immature Granulocytes: 2 %
Lymphocytes Relative: 14 %
Lymphs Abs: 1.3 10*3/uL (ref 0.7–4.0)
MCH: 31.2 pg (ref 26.0–34.0)
MCHC: 30.8 g/dL (ref 30.0–36.0)
MCV: 101.3 fL — ABNORMAL HIGH (ref 80.0–100.0)
Monocytes Absolute: 0.8 10*3/uL (ref 0.1–1.0)
Monocytes Relative: 9 %
Neutro Abs: 6.9 10*3/uL (ref 1.7–7.7)
Neutrophils Relative %: 75 %
Platelets: 275 10*3/uL (ref 150–400)
RBC: 2.34 MIL/uL — ABNORMAL LOW (ref 3.87–5.11)
RDW: 22.6 % — ABNORMAL HIGH (ref 11.5–15.5)
WBC: 9.2 10*3/uL (ref 4.0–10.5)
nRBC: 1 % — ABNORMAL HIGH (ref 0.0–0.2)

## 2022-08-30 LAB — COMPREHENSIVE METABOLIC PANEL
ALT: 18 U/L (ref 0–44)
AST: 25 U/L (ref 15–41)
Albumin: 3.1 g/dL — ABNORMAL LOW (ref 3.5–5.0)
Alkaline Phosphatase: 73 U/L (ref 38–126)
Anion gap: 13 (ref 5–15)
BUN: 46 mg/dL — ABNORMAL HIGH (ref 8–23)
CO2: 16 mmol/L — ABNORMAL LOW (ref 22–32)
Calcium: 8.8 mg/dL — ABNORMAL LOW (ref 8.9–10.3)
Chloride: 111 mmol/L (ref 98–111)
Creatinine, Ser: 2.89 mg/dL — ABNORMAL HIGH (ref 0.44–1.00)
GFR, Estimated: 16 mL/min — ABNORMAL LOW (ref >60)
Glucose, Bld: 101 mg/dL — ABNORMAL HIGH (ref 70–99)
Potassium: 4 mmol/L (ref 3.5–5.1)
Sodium: 140 mmol/L (ref 135–145)
Total Bilirubin: 0.4 mg/dL (ref 0.3–1.2)
Total Protein: 6.3 g/dL — ABNORMAL LOW (ref 6.5–8.1)

## 2022-08-30 LAB — RESP PANEL BY RT-PCR (RSV, FLU A&B, COVID)  RVPGX2
Influenza A by PCR: NEGATIVE
Influenza B by PCR: NEGATIVE
Resp Syncytial Virus by PCR: NEGATIVE
SARS Coronavirus 2 by RT PCR: NEGATIVE

## 2022-08-30 LAB — APTT: aPTT: 30 seconds (ref 24–36)

## 2022-08-30 LAB — D-DIMER, QUANTITATIVE: D-Dimer, Quant: 1.95 ug/mL-FEU — ABNORMAL HIGH (ref 0.00–0.50)

## 2022-08-30 LAB — HEPARIN LEVEL (UNFRACTIONATED): Heparin Unfractionated: >1.1 IU/mL — ABNORMAL HIGH (ref 0.30–0.70)

## 2022-08-30 LAB — BRAIN NATRIURETIC PEPTIDE: B Natriuretic Peptide: >4500 pg/mL — ABNORMAL HIGH (ref 0.0–100.0)

## 2022-08-30 LAB — TROPONIN I (HIGH SENSITIVITY)
Troponin I (High Sensitivity): 51 ng/L — ABNORMAL HIGH (ref <18)
Troponin I (High Sensitivity): 52 ng/L — ABNORMAL HIGH (ref <18)

## 2022-08-30 MED ORDER — AMIODARONE HCL 200 MG PO TABS
200.0000 mg | ORAL_TABLET | Freq: Every day | ORAL | Status: DC
  Administered 2022-08-31 – 2022-09-02 (×3): 200 mg via ORAL
  Filled 2022-08-30 (×3): qty 1

## 2022-08-30 MED ORDER — FOLIC ACID 1 MG PO TABS
1.0000 mg | ORAL_TABLET | Freq: Every day | ORAL | Status: DC
  Administered 2022-08-31 – 2022-09-11 (×12): 1 mg via ORAL
  Filled 2022-08-30 (×12): qty 1

## 2022-08-30 MED ORDER — FUROSEMIDE 10 MG/ML IJ SOLN
40.0000 mg | Freq: Once | INTRAMUSCULAR | Status: AC
  Administered 2022-08-30: 40 mg via INTRAVENOUS
  Filled 2022-08-30: qty 4

## 2022-08-30 MED ORDER — SENNOSIDES-DOCUSATE SODIUM 8.6-50 MG PO TABS
1.0000 | ORAL_TABLET | Freq: Every evening | ORAL | Status: DC | PRN
  Administered 2022-09-05: 1 via ORAL
  Filled 2022-08-30: qty 1

## 2022-08-30 MED ORDER — SODIUM CHLORIDE 0.9% FLUSH
3.0000 mL | Freq: Two times a day (BID) | INTRAVENOUS | Status: DC
  Administered 2022-08-30 – 2022-09-04 (×7): 3 mL via INTRAVENOUS

## 2022-08-30 MED ORDER — HEPARIN BOLUS VIA INFUSION
2000.0000 [IU] | Freq: Once | INTRAVENOUS | Status: AC
  Administered 2022-08-30: 2000 [IU] via INTRAVENOUS
  Filled 2022-08-30: qty 2000

## 2022-08-30 MED ORDER — ONDANSETRON HCL 4 MG/2ML IJ SOLN
4.0000 mg | Freq: Four times a day (QID) | INTRAMUSCULAR | Status: DC | PRN
  Filled 2022-08-30: qty 2

## 2022-08-30 MED ORDER — ONDANSETRON HCL 4 MG PO TABS: 4.0000 mg | ORAL_TABLET | Freq: Four times a day (QID) | ORAL | Status: DC | PRN

## 2022-08-30 MED ORDER — LIDOCAINE 5 % EX PTCH
1.0000 | MEDICATED_PATCH | CUTANEOUS | Status: DC
  Administered 2022-08-30 – 2022-09-11 (×13): 1 via TRANSDERMAL
  Filled 2022-08-30 (×13): qty 1

## 2022-08-30 MED ORDER — HEPARIN (PORCINE) 25000 UT/250ML-% IV SOLN
750.0000 [IU]/h | INTRAVENOUS | Status: DC
  Administered 2022-08-30 – 2022-09-01 (×2): 800 [IU]/h via INTRAVENOUS
  Filled 2022-08-30 (×2): qty 250

## 2022-08-30 MED ORDER — SODIUM BICARBONATE 650 MG PO TABS
650.0000 mg | ORAL_TABLET | Freq: Every day | ORAL | Status: DC
  Administered 2022-08-31 – 2022-09-11 (×12): 650 mg via ORAL
  Filled 2022-08-30 (×12): qty 1

## 2022-08-30 MED ORDER — ACETAMINOPHEN 650 MG RE SUPP: 650.0000 mg | Freq: Four times a day (QID) | RECTAL | Status: DC | PRN

## 2022-08-30 MED ORDER — ACETAMINOPHEN 325 MG PO TABS: 650.0000 mg | ORAL_TABLET | Freq: Four times a day (QID) | ORAL | Status: DC | PRN

## 2022-08-30 MED ORDER — FUROSEMIDE 10 MG/ML IJ SOLN
40.0000 mg | Freq: Two times a day (BID) | INTRAMUSCULAR | Status: DC
  Administered 2022-08-31: 40 mg via INTRAVENOUS
  Filled 2022-08-30 (×2): qty 4

## 2022-08-30 MED ORDER — PREDNISONE 5 MG PO TABS
5.0000 mg | ORAL_TABLET | Freq: Every day | ORAL | Status: DC
  Administered 2022-08-31 – 2022-09-11 (×13): 5 mg via ORAL
  Filled 2022-08-30 (×12): qty 1

## 2022-08-30 MED ORDER — HYDROXYCHLOROQUINE SULFATE 200 MG PO TABS
200.0000 mg | ORAL_TABLET | Freq: Every day | ORAL | Status: DC
  Administered 2022-08-31: 200 mg via ORAL
  Filled 2022-08-30 (×2): qty 1

## 2022-08-30 MED ORDER — ACETAMINOPHEN 325 MG PO TABS
650.0000 mg | ORAL_TABLET | Freq: Four times a day (QID) | ORAL | Status: DC | PRN
  Administered 2022-08-30 – 2022-09-09 (×4): 650 mg via ORAL
  Filled 2022-08-30 (×4): qty 2

## 2022-08-30 MED ORDER — MIDODRINE HCL 5 MG PO TABS
5.0000 mg | ORAL_TABLET | Freq: Three times a day (TID) | ORAL | Status: DC
  Administered 2022-08-31 – 2022-09-01 (×5): 5 mg via ORAL
  Filled 2022-08-30 (×5): qty 1

## 2022-08-30 MED ORDER — AZATHIOPRINE 50 MG PO TABS
100.0000 mg | ORAL_TABLET | Freq: Every day | ORAL | Status: DC
  Administered 2022-08-31 – 2022-09-11 (×12): 100 mg via ORAL
  Filled 2022-08-30 (×12): qty 2

## 2022-08-30 MED ORDER — VERICIGUAT 10 MG PO TABS
10.0000 mg | ORAL_TABLET | Freq: Every day | ORAL | Status: DC
  Administered 2022-09-01 – 2022-09-11 (×11): 10 mg via ORAL
  Filled 2022-08-30 (×11): qty 1

## 2022-08-30 NOTE — Hospital Course (Addendum)
Tracy Nelson was admitted to the hospital with the working diagnosis of heart failure decompensation.   77 y.o. 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  Patient reports 2 days of dyspnea, orthopnea, PND, and cough. Her symptoms were persistent despite use of oral diuretic therapy at home. On her initial physical examination her blood pressure was 120/92, HR 98, RR 21 and 02 saturation 100%, Lungs with decreased breath sounds, no rales or wheezing, heart with S1 and S2 present and rhythmic, abdomen with no distention, no lower extremity edema.   Na 140, K 4,0 CL 111, bicarbonate 16 glucose 101 bun 46 cr 2,89 BNP >4,500 High sensitive troponin 51 and 52 Wbc 9.2 hgb 7.3 plt 275  D dimer 1.95  Sars covid 19 and influenza negative   Chest radiograph with cardiomegaly with bilateral hilar vascular congestion with small bilateral pleura effusions.   EKG 100 bpm, left axis deviation, normal intervals, sinus rhythm with poor R R wave progression, no ST segment changes, negative T wave lead I and AvL.   Patient was placed on IV furosemide and IV heparin V/Q scan negative for pulmonary embolism.   01/22 PRBC transfusion x1 01/23 low output heart failure, started on milrinone infusion, 0.25 mcg/kg/min. 01/25 right heart catheterization with mild elevation of right and left heart filling pressures with mild venous hypertension, good cardiac output on milrinone.  01/26 milrinone is down  0.125 mcg/kg/min 01/27 patient with hypotensive and hypoperfusion on lower dose of milrinone drip. Poor prognosis, end stage heart failure.  01/28 continue with inotropic support with milrinone.  01/29 discontinue milrinone.  01/30 off milrinone and resume oral diuretic therapy.

## 2022-08-30 NOTE — Assessment & Plan Note (Signed)
Due to recent surgery and elevated D-dimer of 1.95 patient was started on IV heparin in the ED due to concern for possible PE despite chronic anticoagulation with Eliquis.  CTA chest not able to be performed due to CKD stage IV. -Can continue IV heparin for now -Follow-up VQ scan, if negative transition back to Eliquis

## 2022-08-30 NOTE — Assessment & Plan Note (Signed)
Renal function relatively stable.  Follows with nephrology, Dr. Royce Macadamia.  Monitor labs closely with diuresis.

## 2022-08-30 NOTE — Progress Notes (Signed)
ANTICOAGULATION CONSULT NOTE - Initial Consult  Pharmacy Consult for Heparin Indication:  rule out PE  Allergies  Allergen Reactions   Baclofen Nausea And Vomiting   Penicillin G     Other reaction(s): Unknown   Penicillins Rash    Patient Measurements:   Heparin Dosing Weight: 45.5 kg  Vital Signs: Temp: 97.9 F (36.6 C) (01/21 1749) Temp Source: Oral (01/21 1749) BP: 119/85 (01/21 1920) Pulse Rate: 105 (01/21 1920)  Labs: Recent Labs    08/30/22 1409 08/30/22 1610  HGB 7.3*  --   HCT 23.7*  --   PLT 275  --   CREATININE 2.89*  --   TROPONINIHS  --  51*    CrCl cannot be calculated (Unknown ideal weight.).   Medical History: Past Medical History:  Diagnosis Date   Acid reflux 07/15/2020   Arthritis 07/15/2020   CHF (congestive heart failure) (HCC)    Colitis 07/15/2020   Dilated cardiomyopathy ([place]) 07/15/2020   HFrEF (heart failure with reduced ejection fraction) ([place]) 07/15/2020   HTN (hypertension) 07/15/2020   Osteoporosis 07/15/2020   Palpitations 07/15/2020   Rheumatoid arthritis ([place]) 07/15/2020    Medications:  (Not in a hospital admission)  Scheduled:  Infusions:  PRN:   Assessment: 39 yof with a history of HF, HTN, RA, rt decubitus heel ulceration. Patient is presenting with cough, SOB, weakness. Heparin per pharmacy consult placed for  rule out PE .  Patient is on apixaban prior to arrival. Last dose 1/20pm, per patient. Will require aPTT monitoring due to likely falsely high anti-Xa level secondary to DOAC use.  Hgb 7.3 - known; plt 275  Goal of Therapy:  Heparin level 0.3-0.7 units/ml aPTT 66-102 seconds Monitor platelets by anticoagulation protocol: Yes   Plan:  Baseline aPTT and heparin level to be obtained Will cautiously give 2000 unit bolus of heparin in light of concern for new VTE while on apixaban therapy and last apixaban dose ~24hrs ago Start heparin infusion at 800 units/hr Check aPTT & anti-Xa level in 8 hours and daily  while on heparin Continue to monitor via aPTT until levels are correlated Continue to monitor H&H and platelets  Lorelei Pont, PharmD, BCPS 08/30/2022 8:16 PM ED Clinical Pharmacist -  [phone]

## 2022-08-30 NOTE — Assessment & Plan Note (Signed)
On chronic immunosuppressants.  Continue prednisone, Plaquenil, Imuran.

## 2022-08-30 NOTE — Assessment & Plan Note (Signed)
Presenting with dyspnea, orthopnea, PND, BNP >4500.  Last TTE 07/11/2022 showed EF improved to 45-50% compared to previous <25%.  Follows with advanced heart failure team, Dr. Aundra Dubin.  Concern is she is approaching end-stage heart failure.  Renal dysfunction and hypotension have limited GDMT. -IV Lasix 40 mg twice daily -Strict I/O's and daily weights -Continue midodrine for BP support -Continue Verquvo

## 2022-08-30 NOTE — H&P (Signed)
History and Physical    Tracy Nelson ION:[id number] DOB: 02-Jul-1946 DOA: 08/30/2022  PCP: [name], [name]  Patient coming from: Home  I have personally briefly reviewed patient's old medical records in [place]  Chief Complaint: Cough, shortness of breath  HPI: LEOMIA Nelson is a 77 y.o. 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  Patient reports 2 days of shortness of breath with orthopnea, PND, cough occasionally productive of clear sputum.  She has had poor appetite.  She says she has been taking her Bumex daily and took an extra dose last night due to her symptoms with transient relief.  She says urine output has diminished over the last 24 hours.  She denies nausea, vomiting, abdominal pain, chest pain, dysuria.  She has not seen much swelling in her legs.  She has noticed swelling of her face over the last week.  ED Course  Labs/Imaging on admission: I have personally reviewed following labs and imaging studies.  Initial vitals showed BP 120/92, pulse 98, RR 21, temp 97.9 F, SpO2 100% on room air.  Labs show WBC 9.2, hemoglobin 7.3, platelets 275,000, sodium 140, potassium 4.0, bicarb 16, BUN 46, creatinine 2.9, serum glucose 101, LFTs within normal limits, BNP >4500, D-dimer 1.95, troponin 51 > 52.  COVID, influenza, RSV negative.  2 view chest x-ray showed small bilateral pleural effusions, left greater than right bibasilar patchy opacities.  Patient was given IV Lasix 40 mg and started on IV heparin.  The hospitalist service was consulted to admit for further evaluation and management.  Review of Systems: All systems reviewed and are negative except as documented in history of present  illness above.   Past Medical History:  Diagnosis Date   Acid reflux 07/15/2020   Arthritis 07/15/2020   CHF (congestive heart failure) ([place])    Colitis 07/15/2020   Dilated cardiomyopathy ([place]) 07/15/2020   HFrEF (heart failure with reduced ejection fraction) ([place]) 07/15/2020   HTN (hypertension) 07/15/2020   Osteoporosis 07/15/2020   Palpitations 07/15/2020   Rheumatoid arthritis ([place]) 07/15/2020    Past Surgical History:  Procedure Laterality Date   CARDIOVERSION N/A 11/08/2020   Procedure: CARDIOVERSION;  Surgeon: [name], MD;  Location: [facility];  Service: Cardiovascular;  Laterality: N/A;   INTRAMEDULLARY (IM) NAIL INTERTROCHANTERIC Left 07/10/2022   Procedure: INTRAMEDULLARY (IM) NAIL INTERTROCHANTERIC;  Surgeon: Leandrew Koyanagi, MD;  Location: [facility];  Service: Orthopedics;  Laterality: Left;   RIGHT HEART CATH N/A 08/14/2020   Procedure: RIGHT HEART CATH;  Surgeon: [name], MD;  Location: [facility];  Service: Cardiovascular;  Laterality: N/A;   TEE WITHOUT CARDIOVERSION N/A 11/08/2020   Procedure: TRANSESOPHAGEAL ECHOCARDIOGRAM (TEE);  Surgeon: [name], MD;  Location: [facility];  Service: Cardiovascular;  Laterality: N/A;    Social History:  reports that she has never smoked. She has never used smokeless tobacco. She reports that she does not drink alcohol and does not use drugs.  Allergies  Allergen Reactions   Baclofen Nausea And Vomiting   Penicillin G     Other reaction(s): Unknown   Penicillins Rash    Family History  Problem Relation Age of Onset   Hypertension Mother    Diabetes Mother    Hypertension  Father    Diabetes Father    Breast cancer Maternal Aunt    Breast cancer Paternal Aunt    Arthritis Maternal Grandmother    Lung disease Paternal Grandfather    Cancer Brother      Prior to Admission medications   Medication Sig Start Date End Date Taking? Authorizing Provider  amiodarone (PACERONE) 200 MG tablet TAKE 1  TABLET BY MOUTH EVERY DAY 12/19/21   [name], MD  azaTHIOprine (IMURAN) 50 MG tablet TAKE 2 TABLETS BY MOUTH EVERY DAY 04/20/22   Rice, Resa Miner, MD  bumetanide (BUMEX) 1 MG tablet Take 1 mg by mouth daily.    [provider]  calcitRIOL (ROCALTROL) 0.25 MCG capsule Take 0.25 mcg by mouth daily. 06/25/22   [provider]  Cholecalciferol (VITAMIN D3) 25 MCG (1000 UT) CAPS Take 1 capsule by mouth daily in the afternoon.    [provider]  cyclobenzaprine (FLEXERIL) 10 MG tablet Take 1 tablet (10 mg total) by mouth 3 (three) times daily as needed for muscle spasms. 12/29/21   [name], [name]  ELIQUIS 2.5 MG TABS tablet TAKE 1 TABLET BY MOUTH TWICE A DAY 12/31/21   [name], MD  Ensure (ENSURE) Take 237 mLs by mouth 2 (two) times daily between meals.    [provider]  folic acid (FOLVITE) 1 MG tablet TAKE 1 TABLET BY MOUTH EVERY DAY 07/22/22   [name], MD  hydroxychloroquine (PLAQUENIL) 200 MG tablet TAKE 1 TABLET BY MOUTH EVERY DAY 05/29/22   [name], MD  hydroxypropyl methylcellulose / hypromellose (ISOPTO TEARS / GONIOVISC) 2.5 % ophthalmic solution Place 1 drop into both eyes as needed for dry eyes.    [provider]  lidocaine (LIDODERM) 5 % Place 1 patch onto the skin daily. Remove & Discard patch within 12 hours or as directed by MD 03/09/22   [name], [name]  Menthol, Topical Analgesic, (ICY HOT BACK EX) Apply 1 application topically daily as needed (pain).    [provider]  midodrine (PROAMATINE) 5 MG tablet Take 1 tablet (5 mg total) by mouth 3 (three) times daily with meals. 02/04/22   [name], MD  Multiple Vitamin (MULTIVITAMIN) tablet Take 1 tablet by mouth daily.    [provider]  omeprazole (PRILOSEC) 40 MG capsule Take 40 mg by mouth daily.    [provider]  oxyCODONE (OXY IR/ROXICODONE) 5 MG immediate release tablet Take 1-2 tablets (5-10 mg total) by mouth  every 8 (eight) hours as needed for severe pain. 07/10/22   Leandrew Koyanagi, MD  polyethylene glycol (MIRALAX / GLYCOLAX) 17 g packet Take 17 g by mouth as needed.    [provider]  predniSONE (DELTASONE) 5 MG tablet TAKE 1 TABLET BY MOUTH EVERY DAY WITH BREAKFAST 05/04/22   Rice, Resa Miner, MD  TUMS E-X 750 750 MG chewable tablet Chew 2 tablets by mouth 3 (three) times daily as needed. 03/26/22   [provider]  Vericiguat (VERQUVO) 10 MG TABS Take 10 mg by mouth daily. 09/03/21   [name], MD    Physical Exam: Vitals:   08/30/22 1920 08/30/22 2015 08/30/22 2134 08/30/22 2145  BP: 119/85 120/87  118/87  Pulse: (!) 105 (!) 104  (!) 104  Resp: (!) 23 (!) 26  (!) 25  Temp:   98.6 F (37 C)   TempSrc:   Oral   SpO2: 100% 98%  100%   Constitutional:  Resting in bed, NAD, calm, comfortable Eyes: EOMI, lids and conjunctivae normal HENMT: Moon facies.  Mucous membranes are moist. Posterior pharynx clear of any exudate or lesions.Normal dentition.  Neck: normal, supple, no masses. Respiratory: Diminished breath sounds. Normal respiratory effort. No accessory muscle use.  Cardiovascular: Regular rate and rhythm, no murmurs / rubs / gallops. No extremity edema. 2+ pedal pulses. Abdomen: no tenderness, no masses palpated. Musculoskeletal: no clubbing / cyanosis. No joint deformity upper and lower extremities. Good ROM, no contractures. Normal muscle tone.  Skin: no rashes, lesions, ulcers. No induration Neurologic: Sensation intact. Strength 5/5 in all 4.  Psychiatric: Alert and oriented x 3. Normal mood.   EKG: Personally reviewed. Sinus rhythm, rate 100, LAD, no acute ischemic changes.  Similar to prior.  Assessment/Plan Principal Problem:   Acute on chronic HFrEF (heart failure with reduced ejection fraction) (HCC) Active Problems:   Elevated d-dimer   CKD (chronic kidney disease), stage IV (HCC)   Paroxysmal atrial fibrillation (HCC)   Anemia of chronic  disease   Rheumatoid arthritis involving multiple sites with positive rheumatoid factor (HCC)   Tracy Nelson is a 77 y.o. 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  Assessment and Plan: * Acute on chronic HFrEF (heart failure with reduced ejection fraction) (HCC) Presenting with dyspnea, orthopnea, PND, BNP >4500.  Last TTE 07/11/2022 showed EF improved to 45-50% compared to previous <25%.  Follows with advanced heart failure team, Dr. Aundra Dubin.  Concern is she is approaching end-stage heart failure.  Renal dysfunction and hypotension have limited GDMT. -IV Lasix 40 mg twice daily -Strict I/O's and daily weights -Continue midodrine for BP support -Continue Verquvo  Elevated d-dimer Due to recent surgery and elevated D-dimer of 1.95 patient was started on IV heparin in the ED due to concern for possible PE despite chronic anticoagulation with Eliquis.  CTA chest not able to be performed due to CKD stage IV. -Can continue IV heparin for now -Follow-up VQ scan, if negative transition back to Eliquis  CKD (chronic kidney disease), stage IV ([place]) Renal function relatively stable.  Follows with nephrology, Dr. Royce Macadamia.  Monitor labs closely with diuresis.  Paroxysmal atrial fibrillation (HCC) In sinus rhythm with controlled rate on admission.  Continue amiodarone and IV heparin as above.  Anemia of chronic disease Chronic anemia due to CKD and iron deficiency.  Hemoglobin slightly below recent baseline.  No obvious bleeding.  Continue to monitor.  Rheumatoid arthritis involving multiple sites with positive rheumatoid factor (HCC) On chronic immunosuppressants.  Continue prednisone, Plaquenil, Imuran.  DVT prophylaxis: IV heparin Code  Status: Full code, confirmed with patient on admission Family Communication: Discussed with patient, she has discussed with family Disposition Plan: From home, dispo pending clinical progress Consults called: None Severity of Illness: The appropriate patient status for this patient is INPATIENT. Inpatient status is judged to be reasonable and necessary in order to provide the required intensity of service to ensure the patient's safety. The patient's presenting symptoms, physical exam findings, and initial radiographic and laboratory data in the context of their chronic comorbidities is felt to place them at high risk for further clinical deterioration. Furthermore, it is not anticipated that the patient will be medically stable for discharge from the hospital within 2 midnights of admission.   *  I certify that at the point of admission it is my clinical judgment that the patient will require inpatient hospital care spanning beyond 2 midnights from the point of admission due to high intensity of service, high risk for further deterioration and high frequency of surveillance required.Zada Finders MD [facility]  If 7PM-7AM, please contact night-coverage [email or web address]  08/30/2022, 10:54 PM

## 2022-08-30 NOTE — Assessment & Plan Note (Signed)
In sinus rhythm with controlled rate on admission.  Continue amiodarone and IV heparin as above.

## 2022-08-30 NOTE — ED Provider Notes (Signed)
[facility] Provider Note   CSN: [id number] Arrival date & time: 08/30/22  1346     History  Chief Complaint  Patient presents with   Cough   Shortness of Breath   Weakness    Tracy Nelson is a 77 y.o. female.   Cough Associated symptoms: shortness of breath   Shortness of Breath Associated symptoms: cough   Weakness Associated symptoms: cough and shortness of breath      77 year old female with medical history significant for CHF, CKD, paroxysmal atrial fibrillation on Eliquis, anemia, recent hip surgery in December 2023 who presents to the emergency department with a few days of shortness of breath, orthopnea, cough mildly productive.  She endorses fatigue and decreased appetite.  She had difficulty lying flat and took an additional Bumex thinking she might of an fluid overloaded.  She denies any lower extremity swelling.  She denies any fevers or chills.  She denies any abdominal pain, nausea or vomiting.   Home Medications Prior to Admission medications   Medication Sig Start Date End Date Taking? Authorizing Provider  amiodarone (PACERONE) 200 MG tablet TAKE 1 TABLET BY MOUTH EVERY DAY Patient taking differently: Take 200 mg by mouth daily. 12/19/21  Yes [name], MD  azaTHIOprine (IMURAN) 50 MG tablet TAKE 2 TABLETS BY MOUTH EVERY DAY Patient taking differently: Take 100 mg by mouth daily. 04/20/22  Yes Rice, Resa Miner, MD  bumetanide (BUMEX) 1 MG tablet Take 1 mg by mouth daily.   Yes [provider]  calcitRIOL (ROCALTROL) 0.25 MCG capsule Take 0.25 mcg by mouth daily. 06/25/22  Yes [provider]  ELIQUIS 2.5 MG TABS tablet TAKE 1 TABLET BY MOUTH TWICE A DAY Patient taking differently: Take 2.5 mg by mouth 2 (two) times daily. 12/31/21  Yes [name], MD  Ensure (ENSURE) Take 237 mLs by mouth daily as needed (for meal supplement).   Yes [provider]  folic acid (FOLVITE)  1 MG tablet TAKE 1 TABLET BY MOUTH EVERY DAY Patient taking differently: Take 1 mg by mouth daily. 07/22/22  Yes [name], MD  hydroxychloroquine (PLAQUENIL) 200 MG tablet TAKE 1 TABLET BY MOUTH EVERY DAY Patient taking differently: Take 200 mg by mouth daily. 05/29/22  Yes [name], MD  hydroxypropyl methylcellulose / hypromellose (ISOPTO TEARS / GONIOVISC) 2.5 % ophthalmic solution Place 1 drop into both eyes as needed for dry eyes.   Yes [provider]  lidocaine (LIDODERM) 5 % Place 1 patch onto the skin daily. Remove & Discard patch within 12 hours or as directed by MD Patient taking differently: Place 1 patch onto the skin daily as needed (for shoulder pain). Remove & Discard patch within 12 hours or as directed by MD 03/09/22  Yes [name], [name]  Menthol, Topical Analgesic, (ICY HOT BACK EX) Apply 1 application topically daily as needed (pain).   Yes [provider]  midodrine (PROAMATINE) 5 MG tablet Take 1 tablet (5 mg total) by mouth 3 (three) times daily with meals. 02/04/22  Yes [name], MD  Multiple Vitamin (MULTIVITAMIN) tablet Take 1 tablet by mouth daily.   Yes [provider]  omeprazole (PRILOSEC) 40 MG capsule Take 40 mg by mouth daily as needed (for acid reflux).   Yes [provider]  polyethylene glycol (MIRALAX / GLYCOLAX) 17 g packet Take 17 g by mouth daily as needed for mild constipation.   Yes [provider]  predniSONE (DELTASONE) 5 MG tablet TAKE 1 TABLET BY MOUTH EVERY DAY WITH BREAKFAST Patient taking differently: Take 5 mg by mouth daily with breakfast. 05/04/22  Yes Rice, Resa Miner, MD  Vericiguat (VERQUVO) 10 MG TABS Take 10 mg by mouth daily. 09/03/21  Yes [name], MD  sodium bicarbonate 650 MG tablet Take 650 mg by mouth daily. Patient not taking: Reported on 08/30/2022 08/26/22   [provider]      Allergies    Baclofen, Penicillin g, and Penicillins    Review of  Systems   Review of Systems  Respiratory:  Positive for cough and shortness of breath.   Neurological:  Positive for weakness.  All other systems reviewed and are negative.   Physical Exam Updated Vital Signs BP (!) 117/90   Pulse (!) 102   Temp 98.6 F (37 C) (Oral)   Resp 20   SpO2 98%  Physical Exam Vitals and nursing note reviewed.  Constitutional:      General: She is not in acute distress.    Appearance: She is well-developed.  HENT:     Head: Normocephalic and atraumatic.  Eyes:     Conjunctiva/sclera: Conjunctivae normal.  Cardiovascular:     Rate and Rhythm: Normal rate and regular rhythm.     Heart sounds: No murmur heard. Pulmonary:     Effort: Pulmonary effort is normal. No respiratory distress.     Breath sounds: Normal breath sounds.  Abdominal:     Palpations: Abdomen is soft.     Tenderness: There is no abdominal tenderness.  Musculoskeletal:        General: No swelling.     Cervical back: Neck supple.     Right lower leg: No edema.     Left lower leg: No edema.  Skin:    General: Skin is warm and dry.     Capillary Refill: Capillary refill takes less than 2 seconds.     Coloration: Skin is pale.  Neurological:     Mental Status: She is alert.  Psychiatric:        Mood and Affect: Mood normal.     ED Results / Procedures / Treatments   Labs (all labs ordered are listed, but only abnormal results are displayed) Labs Reviewed  CBC WITH DIFFERENTIAL/PLATELET - Abnormal; Notable for the following components:      Result Value   RBC 2.34 (*)    Hemoglobin 7.3 (*)    HCT 23.7 (*)    MCV 101.3 (*)    RDW 22.6 (*)    nRBC 1.0 (*)    Abs Immature Granulocytes 0.14 (*)    All other components within normal limits  COMPREHENSIVE METABOLIC PANEL - Abnormal; Notable for the following components:   CO2 16 (*)    Glucose, Bld 101 (*)    BUN 46 (*)    Creatinine, Ser 2.89 (*)    Calcium 8.8 (*)    Total Protein 6.3 (*)    Albumin 3.1 (*)    GFR,  Estimated 16 (*)    All other components within normal limits  BRAIN NATRIURETIC PEPTIDE - Abnormal; Notable for the following components:   B Natriuretic Peptide >4,500.0 (*)    All other components within normal limits  D-DIMER, QUANTITATIVE - Abnormal; Notable for the following components:   D-Dimer, Quant 1.95 (*)    All other components within normal limits  HEPARIN LEVEL (UNFRACTIONATED) - Abnormal; Notable for the following components:  Heparin Unfractionated >1.10 (*)    All other components within normal limits  TROPONIN I (HIGH SENSITIVITY) - Abnormal; Notable for the following components:   Troponin I (High Sensitivity) 51 (*)    All other components within normal limits  TROPONIN I (HIGH SENSITIVITY) - Abnormal; Notable for the following components:   Troponin I (High Sensitivity) 52 (*)    All other components within normal limits  RESP PANEL BY RT-PCR (RSV, FLU A&B, COVID)  RVPGX2  APTT  HEPARIN LEVEL (UNFRACTIONATED)  APTT  MAGNESIUM  CBC  BASIC METABOLIC PANEL    EKG EKG Interpretation  Date/Time:  Sunday August 30 2022 13:56:37 EST Ventricular Rate:  100 PR Interval:  180 QRS Duration: 92 QT Interval:  376 QTC Calculation: 485 R Axis:   -49 Text Interpretation: Normal sinus rhythm Left axis deviation Inferior infarct , age undetermined Anteroseptal infarct , age undetermined Abnormal ECG When compared with ECG of 14-Jul-2022 16:42, PREVIOUS ECG IS PRESENT Confirmed by Regan Lemming ([id number]) on 08/30/2022 4:04:44 PM  Radiology DG Chest 2 View  Result Date: 08/30/2022 CLINICAL DATA:  Shortness of breath EXAM: CHEST - 2 VIEW COMPARISON:  Chest x-ray 07/08/2022 FINDINGS: The heart is enlarged, unchanged. There are small bilateral pleural effusions. There are minimal bibasilar patchy opacities, left greater than right. There is no evidence for pneumothorax. No acute fractures are seen. IMPRESSION: 1. Small bilateral pleural effusions. 2. Minimal bibasilar patchy  opacities, left greater than right, may represent atelectasis or infection. Electronically Signed   By: Ronney Asters M.D.   On: 08/30/2022 15:19    Procedures Procedures    Medications Ordered in ED Medications  heparin ADULT infusion 100 units/mL (25000 units/276m) (800 Units/hr Intravenous New Bag/Given 08/30/22 2126)  lidocaine (LIDODERM) 5 % 1 patch (1 patch Transdermal Patch Applied 08/30/22 2131)  furosemide (LASIX) injection 40 mg (has no administration in time range)  sodium chloride flush (NS) 0.9 % injection 3 mL (3 mLs Intravenous Given 08/30/22 2338)  acetaminophen (TYLENOL) tablet 650 mg (650 mg Oral Given 08/30/22 2338)    Or  acetaminophen (TYLENOL) suppository 650 mg ( Rectal See Alternative 08/30/22 2338)  ondansetron (ZOFRAN) tablet 4 mg (has no administration in time range)    Or  ondansetron (ZOFRAN) injection 4 mg (has no administration in time range)  senna-docusate (Senokot-S) tablet 1 tablet (has no administration in time range)  amiodarone (PACERONE) tablet 200 mg (has no administration in time range)  azaTHIOprine (IMURAN) tablet 100 mg (has no administration in time range)  folic acid (FOLVITE) tablet 1 mg (has no administration in time range)  hydroxychloroquine (PLAQUENIL) tablet 200 mg (has no administration in time range)  midodrine (PROAMATINE) tablet 5 mg (has no administration in time range)  predniSONE (DELTASONE) tablet 5 mg (has no administration in time range)  Vericiguat TABS 10 mg (has no administration in time range)  sodium bicarbonate tablet 650 mg (has no administration in time range)  heparin bolus via infusion 2,000 Units (2,000 Units Intravenous Bolus from Bag 08/30/22 2131)  furosemide (LASIX) injection 40 mg (40 mg Intravenous Given 08/30/22 2120)    ED Course/ Medical Decision Making/ A&P Clinical Course as of 08/30/22 2343  Sun Aug 30, 2022  1923 D-Dimer, Quant(!): 1.95 [JL]  1923 Troponin I (High Sensitivity)(!): 51 [JL]    Clinical  Course User Index [JL] LRegan Lemming MD  Medical Decision Making Amount and/or Complexity of Data Reviewed Labs: ordered. Decision-making details documented in ED Course. Radiology: ordered.  Risk Prescription drug management. Decision regarding hospitalization.    78 year old female with medical history significant for CHF, CKD, paroxysmal atrial fibrillation on Eliquis, anemia, recent hip surgery in December 2023 who presents to the emergency department with a few days of shortness of breath, orthopnea, cough mildly productive.  She endorses fatigue and decreased appetite.  She had difficulty lying flat and took an additional Bumex thinking she might of an fluid overloaded.  She denies any lower extremity swelling.  She denies any fevers or chills.  She denies any abdominal pain, nausea or vomiting.   On arrival, the patient was afebrile, tachycardic P104, hemodynamically stable BP 119/85, saturating 95% on room air.  Patient presenting with worsening symptoms consistent with potential heart failure with orthopnea, dyspnea on exertion.  She does endorse a mildly productive cough.  He is afebrile.  She recently had surgery in the in December on her hip.  She is on anticoagulation and has been compliant outpatient.  Differential diagnosis includes CHF exacerbation, pneumonia, viral URI, considered breakthrough PE despite outpatient anticoagulation use.  The patient has failed outpatient Bumex taking additional doses without improvement in her symptoms.  She denies any chest pain.  Low suspicion for ACS.  Initial EKG revealed sinus rhythm, ventricular rate 100, no STEMI, chest x-ray performed revealed small bilateral pleural effusions with minimal patchy opacities, left greater than right which may represent atelectasis.  The patient is afebrile.  CBC without a leukocytosis, anemia stable at 7.3 from a measurement 3 days ago.  Patient denies any melena or  hematochezia.  No hematemesis. Low concern for bacterial PNA.  COVID-19, influenza, RSV PCR testing was collected and resulted negative,Troponins were mildly elevated but flat at 51 and 52, a D-dimer was elevated at 1.95, BNP was elevated greater than 4500.  The patient's CMP revealed CKD with a serum creatinine of 2.89 and a GFR of 16.  She cannot undergo CT imaging.  My primary concern is for CHF exacerbation although considered breakthrough PE, VQ scan ordered and the patient was started on heparin, last dose of Eliquis was last night.  Plan to rule out PE, admit the patient for VQ scan, repeat echocardiogram, continue diuresis.  40 mg of IV Lasix was administered.   Patient hemodynamically stable at time of admission, hospitalist medicine consulted for admission for treatment of CHF exacerbation, Dr. Posey Pronto accepted the patient in admission.  Final Clinical Impression(s) / ED Diagnoses Final diagnoses:  Congestive heart failure, unspecified HF chronicity, unspecified heart failure type ([place])  Positive D dimer    Rx / DC Orders ED Discharge Orders     None         Regan Lemming, MD 08/30/22 2346

## 2022-08-30 NOTE — ED Triage Notes (Signed)
Pt here with one week of cough, shob and ongoing weakness. Family reports symptoms are worsening.

## 2022-08-30 NOTE — ED Notes (Signed)
ED TO INPATIENT HANDOFF REPORT  ED Nurse Name and Phone #: [phone]  S Name/Age/Gender Tracy Nelson 77 y.o. female Room/Bed: 025C/025C  Code Status   Code Status: Full Code  Home/SNF/Other Home Patient oriented to: self, place, time, and situation Is this baseline? Yes   Triage Complete: Triage complete  Chief Complaint Acute on chronic HFrEF (heart failure with reduced ejection fraction) (HCC) [I50.23]  Triage Note Pt here with one week of cough, shob and ongoing weakness. Family reports symptoms are worsening.    Allergies Allergies  Allergen Reactions   Baclofen Nausea And Vomiting   Penicillin G     Other reaction(s): Unknown   Penicillins Rash    Level of Care/Admitting Diagnosis ED Disposition     ED Disposition  Admit   Condition  --   [place]: [place] [[id number]]  Level of Care: Progressive [102]  Admit to Progressive based on following criteria: CARDIOVASCULAR & THORACIC of moderate stability with acute coronary syndrome symptoms/low risk myocardial infarction/hypertensive urgency/arrhythmias/heart failure potentially compromising stability and stable post cardiovascular intervention patients.  May admit patient to [facility] or [facility] if equivalent level of care is available:: No  Covid Evaluation: Confirmed COVID Negative  Diagnosis: Acute on chronic HFrEF (heart failure with reduced ejection fraction) [facility]) [[id number]]  Admitting Physician: Lenore Cordia [[id number]]  Attending Physician: Lenore Cordia [[id number]]  Certification:: I certify this patient will need inpatient services for at least 2 midnights  Estimated Length of Stay: 2          B Medical/Surgery History Past Medical History:  Diagnosis Date   Acid reflux 07/15/2020   Arthritis 07/15/2020   CHF (congestive heart failure) ([place])    Colitis 07/15/2020   Dilated cardiomyopathy ([place]) 07/15/2020   HFrEF (heart failure with reduced ejection  fraction) ([place]) 07/15/2020   HTN (hypertension) 07/15/2020   Osteoporosis 07/15/2020   Palpitations 07/15/2020   Rheumatoid arthritis ([place]) 07/15/2020   Past Surgical History:  Procedure Laterality Date   CARDIOVERSION N/A 11/08/2020   Procedure: CARDIOVERSION;  Surgeon: [name], MD;  Location: [place];  Service: Cardiovascular;  Laterality: N/A;   INTRAMEDULLARY (IM) NAIL INTERTROCHANTERIC Left 07/10/2022   Procedure: INTRAMEDULLARY (IM) NAIL INTERTROCHANTERIC;  Surgeon: Leandrew Koyanagi, MD;  Location: [place];  Service: Orthopedics;  Laterality: Left;   RIGHT HEART CATH N/A 08/14/2020   Procedure: RIGHT HEART CATH;  Surgeon: [name], MD;  Location: [place] CV LAB;  Service: Cardiovascular;  Laterality: N/A;   TEE WITHOUT CARDIOVERSION N/A 11/08/2020   Procedure: TRANSESOPHAGEAL ECHOCARDIOGRAM (TEE);  Surgeon: [name], MD;  [facility] ENDOSCOPY;  Service: Cardiovascular;  Laterality: N/A;     A IV Location/Drains/Wounds Patient Lines/Drains/Airways Status     Active Line/Drains/Airways     Name Placement date Placement time Site Days   Peripheral IV 08/30/22 24 G Posterior;Right Hand 08/30/22  1845  Hand  less than 1            Intake/Output Last 24 hours  Intake/Output Summary (Last 24 hours) at 08/30/2022 2358 Last data filed at 08/30/2022 2338 Gross per 24 hour  Intake 3 ml  Output --  Net 3 ml    Labs/Imaging Results for orders placed or performed during the hospital encounter of 08/30/22 (from the past 48 hour(s))  CBC with Differential     Status: Abnormal   Collection Time: 08/30/22  2:09 PM  Result Value Ref Range   WBC  9.2 4.0 - 10.5 K/uL   RBC 2.34 (L) 3.87 - 5.11 MIL/uL   Hemoglobin 7.3 (L) 12.0 - 15.0 g/dL   HCT 23.7 (L) 36.0 - 46.0 %   MCV 101.3 (H) 80.0 - 100.0 fL   MCH 31.2 26.0 - 34.0 pg   MCHC 30.8 30.0 - 36.0 g/dL   RDW 22.6 (H) 11.5 - 15.5 %   Platelets 275 150 - 400 K/uL   nRBC 1.0 (H) 0.0 - 0.2 %   Neutrophils  Relative % 75 %   Neutro Abs 6.9 1.7 - 7.7 K/uL   Lymphocytes Relative 14 %   Lymphs Abs 1.3 0.7 - 4.0 K/uL   Monocytes Relative 9 %   Monocytes Absolute 0.8 0.1 - 1.0 K/uL   Eosinophils Relative 0 %   Eosinophils Absolute 0.0 0.0 - 0.5 K/uL   Basophils Relative 0 %   Basophils Absolute 0.0 0.0 - 0.1 K/uL   Immature Granulocytes 2 %   Abs Immature Granulocytes 0.14 (H) 0.00 - 0.07 K/uL   Polychromasia PRESENT    Giant PLTs PRESENT     Comment: Performed at [facility], [address] [place], [place] [address]  Comprehensive metabolic panel     Status: Abnormal   Collection Time: 08/30/22  2:09 PM  Result Value Ref Range   Sodium 140 135 - 145 mmol/L   Potassium 4.0 3.5 - 5.1 mmol/L   Chloride 111 98 - 111 mmol/L   CO2 16 (L) 22 - 32 mmol/L   Glucose, Bld 101 (H) 70 - 99 mg/dL    Comment: Glucose reference range applies only to samples taken after fasting for at least 8 hours.   BUN 46 (H) 8 - 23 mg/dL   Creatinine, Ser 2.89 (H) 0.44 - 1.00 mg/dL   Calcium 8.8 (L) 8.9 - 10.3 mg/dL   Total Protein 6.3 (L) 6.5 - 8.1 g/dL   Albumin 3.1 (L) 3.5 - 5.0 g/dL   AST 25 15 - 41 U/L   ALT 18 0 - 44 U/L   Alkaline Phosphatase 73 38 - 126 U/L   Total Bilirubin 0.4 0.3 - 1.2 mg/dL   GFR, Estimated 16 (L) >60 mL/min    Comment: (NOTE) Calculated using the CKD-EPI Creatinine Equation (2021)    Anion gap 13 5 - 15    Comment: Performed at [facility], [place] [address] [place], [place] [address]  Brain natriuretic peptide     Status: Abnormal   Collection Time: 08/30/22  2:09 PM  Result Value Ref Range   B Natriuretic Peptide >4,500.0 (H) 0.0 - 100.0 pg/mL    Comment: Performed at [facility] [address] [place], [place] [address]  D-dimer, quantitative     Status: Abnormal   Collection Time: 08/30/22  2:50 PM  Result Value Ref Range   D-Dimer, Quant 1.95 (H) 0.00 - 0.50 ug/mL-FEU    Comment: (NOTE) At the manufacturer cut-off value of 0.5 g/mL FEU, this  assay has a negative predictive value of 95-100%.This assay is intended for use in conjunction with a clinical pretest probability (PTP) assessment model to exclude pulmonary embolism (PE) and deep venous thrombosis (DVT) in outpatients suspected of PE or DVT. Results should be correlated with clinical presentation. Performed at [facility], [place] [address] [place], [place] [address]   Resp panel by RT-PCR (RSV, Flu A&B, Covid) Anterior Nasal Swab     Status: None   Collection Time: 08/30/22  3:34 PM   Specimen: Anterior Nasal Swab  Result Value Ref Range   SARS Coronavirus 2 by RT PCR NEGATIVE NEGATIVE    Comment: (NOTE) SARS-CoV-2 target nucleic acids are NOT DETECTED.  The SARS-CoV-2 RNA is generally detectable in upper respiratory specimens during the acute phase of infection. The lowest concentration of SARS-CoV-2 viral copies this assay can detect is 138 copies/mL. A negative result does not preclude SARS-Cov-2 infection and should not be used as the sole basis for treatment or other patient management decisions. A negative result may occur with  improper specimen collection/handling, submission of specimen other than nasopharyngeal swab, presence of viral mutation(s) within the areas targeted by this assay, and inadequate number of viral copies(<138 copies/mL). A negative result must be combined with clinical observations, patient history, and epidemiological information. The expected result is Negative.  Fact Sheet for Patients:  EntrepreneurPulse.com.au  Fact Sheet for Healthcare Providers:  IncredibleEmployment.be  This test is no t yet approved or cleared by the [place] FDA and  has been authorized for detection and/or diagnosis of SARS-CoV-2 by FDA under an Emergency Use Authorization (EUA). This EUA will remain  in effect (meaning this test can be used) for the duration of the COVID-19 declaration under Section  564(b)(1) of the Act, 21 U.S.C.section 360bbb-3(b)(1), unless the authorization is terminated  or revoked sooner.       Influenza A by PCR NEGATIVE NEGATIVE   Influenza B by PCR NEGATIVE NEGATIVE    Comment: (NOTE) The Xpert Xpress SARS-CoV-2/FLU/RSV plus assay is intended as an aid in the diagnosis of influenza from Nasopharyngeal swab specimens and should not be used as a sole basis for treatment. Nasal washings and aspirates are unacceptable for Xpert Xpress SARS-CoV-2/FLU/RSV testing.  Fact Sheet for Patients: EntrepreneurPulse.com.au  Fact Sheet for Healthcare Providers: IncredibleEmployment.be  This test is not yet approved or cleared by the [place] FDA and has been authorized for detection and/or diagnosis of SARS-CoV-2 by FDA under an Emergency Use Authorization (EUA). This EUA will remain in effect (meaning this test can be used) for the duration of the COVID-19 declaration under Section 564(b)(1) of the Act, 21 U.S.C. section 360bbb-3(b)(1), unless the authorization is terminated or revoked.     Resp Syncytial Virus by PCR NEGATIVE NEGATIVE    Comment: (NOTE) Fact Sheet for Patients: EntrepreneurPulse.com.au  Fact Sheet for Healthcare Providers: IncredibleEmployment.be  This test is not yet approved or cleared by the [place] FDA and has been authorized for detection and/or diagnosis of SARS-CoV-2 by FDA under an Emergency Use Authorization (EUA). This EUA will remain in effect (meaning this test can be used) for the duration of the COVID-19 declaration under Section 564(b)(1) of the Act, 21 U.S.C. section 360bbb-3(b)(1), unless the authorization is terminated or revoked.  Performed at [facility], [place] [address] [place], [place] [address]   Troponin I (High Sensitivity)     Status: Abnormal   Collection Time: 08/30/22  4:10 PM  Result Value Ref Range   Troponin I  (High Sensitivity) 51 (H) <18 ng/L    Comment: (NOTE) Elevated high sensitivity troponin I (hsTnI) values and significant  changes across serial measurements may suggest ACS but many other  chronic and acute conditions are known to elevate hsTnI results.  Refer to the "Links" section for chest pain algorithms and additional  guidance. Performed at [facility], [place] [address] [place], [place] [address]   Troponin I (High Sensitivity)     Status:  Abnormal   Collection Time: 08/30/22  8:16 PM  Result Value Ref Range   Troponin I (High Sensitivity) 52 (H) <18 ng/L    Comment: (NOTE) Elevated high sensitivity troponin I (hsTnI) values and significant  changes across serial measurements may suggest ACS but many other  chronic and acute conditions are known to elevate hsTnI results.  Refer to the "Links" section for chest pain algorithms and additional  guidance. Performed at [facility], [place] [address], [place], [place] [address]   Heparin level (unfractionated)     Status: Abnormal   Collection Time: 08/30/22  9:23 PM  Result Value Ref Range   Heparin Unfractionated >1.10 (H) 0.30 - 0.70 IU/mL    Comment: (NOTE) The clinical reportable range upper limit is being lowered to >1.10 to align with the FDA approved guidance for the current laboratory assay.  If heparin results are below expected values, and patient dosage has  been confirmed, suggest follow up testing of antithrombin III levels. Performed at [facility], [place] [address], [place], [place] [address]   APTT     Status: None   Collection Time: 08/30/22  9:23 PM  Result Value Ref Range   aPTT 30 24 - 36 seconds    Comment: Performed at [facility] [address] [place], [place] [address]   DG Chest 2 View  Result Date: 08/30/2022 CLINICAL DATA:  Shortness of breath EXAM: CHEST - 2 VIEW COMPARISON:  Chest x-ray 07/08/2022 FINDINGS: The heart is enlarged, unchanged. There are small bilateral  pleural effusions. There are minimal bibasilar patchy opacities, left greater than right. There is no evidence for pneumothorax. No acute fractures are seen. IMPRESSION: 1. Small bilateral pleural effusions. 2. Minimal bibasilar patchy opacities, left greater than right, may represent atelectasis or infection. Electronically Signed   By: Ronney Asters M.D.   On: 08/30/2022 15:19    Pending Labs Unresulted Labs (From admission, onward)     Start     Ordered   09/01/22 0500  Heparin level (unfractionated)  Daily,   R      08/30/22 2027   09/01/22 0500  APTT  Daily,   R      08/30/22 2027   08/31/22 0500  Heparin level (unfractionated)  Once-Timed,   URGENT        08/30/22 2027   08/31/22 0500  APTT  Once-Timed,   STAT        08/30/22 2027   08/31/22 0500  Magnesium  Tomorrow morning,   R        08/30/22 2233   08/31/22 0500  CBC  Tomorrow morning,   R        08/30/22 2233   08/31/22 2683  Basic metabolic panel  Tomorrow morning,   R        08/30/22 2233            Vitals/Pain Today's Vitals   08/30/22 2300 08/30/22 2330 08/30/22 2342 08/30/22 2342  BP: 106/74 (!) 117/90    Pulse: (!) 109 (!) 102    Resp: (!) 25 20    Temp:   98.6 F (37 C)   TempSrc:   Oral   SpO2: 100% 98%    PainSc:    0-No pain    Isolation Precautions No active isolations  Medications Medications  heparin ADULT infusion 100 units/mL (25000 units/241m) (800 Units/hr Intravenous New Bag/Given 08/30/22 2126)  lidocaine (LIDODERM) 5 % 1 patch (1 patch  Transdermal Patch Applied 08/30/22 2131)  furosemide (LASIX) injection 40 mg (has no administration in time range)  sodium chloride flush (NS) 0.9 % injection 3 mL (3 mLs Intravenous Given 08/30/22 2338)  acetaminophen (TYLENOL) tablet 650 mg (650 mg Oral Given 08/30/22 2338)    Or  acetaminophen (TYLENOL) suppository 650 mg ( Rectal See Alternative 08/30/22 2338)  ondansetron (ZOFRAN) tablet 4 mg (has no administration in time range)    Or  ondansetron  (ZOFRAN) injection 4 mg (has no administration in time range)  senna-docusate (Senokot-S) tablet 1 tablet (has no administration in time range)  amiodarone (PACERONE) tablet 200 mg (has no administration in time range)  azaTHIOprine (IMURAN) tablet 100 mg (has no administration in time range)  folic acid (FOLVITE) tablet 1 mg (has no administration in time range)  hydroxychloroquine (PLAQUENIL) tablet 200 mg (has no administration in time range)  midodrine (PROAMATINE) tablet 5 mg (has no administration in time range)  predniSONE (DELTASONE) tablet 5 mg (has no administration in time range)  Vericiguat TABS 10 mg (has no administration in time range)  sodium bicarbonate tablet 650 mg (has no administration in time range)  heparin bolus via infusion 2,000 Units (2,000 Units Intravenous Bolus from Bag 08/30/22 2131)  furosemide (LASIX) injection 40 mg (40 mg Intravenous Given 08/30/22 2120)    Mobility walks with device (walker) but limited today due to generalize weakness     Focused Assessments Cardiac Assessment Handoff:    No results found for: "CKTOTAL", "CKMB", "CKMBINDEX", "TROPONINI" Lab Results  Component Value Date   DDIMER 1.95 (H) 08/30/2022   Does the Patient currently have chest pain? No    R Recommendations: See Admitting Provider Note  Report given to:   Additional Notes: Call if you have question. We'll send the patient to the floor when the bed is marked clean/ready

## 2022-08-30 NOTE — ED Provider Triage Note (Signed)
Emergency Medicine Provider Triage Evaluation Note  MARNAE MADANI , a 77 y.o. female  was evaluated in triage. History of CHF. Pt complains of shortness of breath and nausea. Denies chest pain, fever, chills, productive cough. No discrete abdominal pain. No pedal edema.  Review of Systems  Positive: Shortness of breath, nausea, facial edema Negative: Chest pain, fever, chills, productive cough  Physical Exam  BP 119/85   Pulse (!) 104   Temp 97.9 F (36.6 C) (Oral)   Resp 20   SpO2 95%  Gen:   Awake, no distress   Resp:  Normal effort  MSK:   Moves extremities without difficulty, no pedal edema  Other:  Abdomen distended  Medical Decision Making  Medically screening exam initiated at 2:01 PM.  Appropriate orders placed.  KENNEDI LIZARDO was informed that the remainder of the evaluation will be completed by another provider, this initial triage assessment does not replace that evaluation, and the importance of remaining in the ED until their evaluation is complete.     Etta Quill, NP 08/30/22 1410

## 2022-08-30 NOTE — Assessment & Plan Note (Signed)
Chronic anemia due to CKD and iron deficiency.  Hemoglobin slightly below recent baseline.  No obvious bleeding.  Continue to monitor.

## 2022-08-31 ENCOUNTER — Inpatient Hospital Stay (HOSPITAL_COMMUNITY): Payer: Medicare PPO

## 2022-08-31 ENCOUNTER — Ambulatory Visit: Payer: Medicare PPO | Admitting: Podiatry

## 2022-08-31 ENCOUNTER — Other Ambulatory Visit: Payer: Self-pay

## 2022-08-31 DIAGNOSIS — I5023 Acute on chronic systolic (congestive) heart failure: Secondary | ICD-10-CM | POA: Diagnosis not present

## 2022-08-31 LAB — BASIC METABOLIC PANEL
Anion gap: 12 (ref 5–15)
BUN: 46 mg/dL — ABNORMAL HIGH (ref 8–23)
CO2: 18 mmol/L — ABNORMAL LOW (ref 22–32)
Calcium: 8.8 mg/dL — ABNORMAL LOW (ref 8.9–10.3)
Chloride: 111 mmol/L (ref 98–111)
Creatinine, Ser: 2.84 mg/dL — ABNORMAL HIGH (ref 0.44–1.00)
GFR, Estimated: 17 mL/min — ABNORMAL LOW (ref >60)
Glucose, Bld: 105 mg/dL — ABNORMAL HIGH (ref 70–99)
Potassium: 3.8 mmol/L (ref 3.5–5.1)
Sodium: 141 mmol/L (ref 135–145)

## 2022-08-31 LAB — RETICULOCYTES
Immature Retic Fract: 33.1 % — ABNORMAL HIGH (ref 2.3–15.9)
RBC.: 2.31 MIL/uL — ABNORMAL LOW (ref 3.87–5.11)
Retic Count, Absolute: 107.4 10*3/uL (ref 19.0–186.0)
Retic Ct Pct: 4.7 % — ABNORMAL HIGH (ref 0.4–3.1)

## 2022-08-31 LAB — IRON AND TIBC
Iron: 38 ug/dL (ref 28–170)
Saturation Ratios: 13 % (ref 10.4–31.8)
TIBC: 304 ug/dL (ref 250–450)
UIBC: 266 ug/dL

## 2022-08-31 LAB — FOLATE: Folate: >40 ng/mL (ref >5.9)

## 2022-08-31 LAB — PREPARE RBC (CROSSMATCH)

## 2022-08-31 LAB — CBC
HCT: 23 % — ABNORMAL LOW (ref 36.0–46.0)
Hemoglobin: 7 g/dL — ABNORMAL LOW (ref 12.0–15.0)
MCH: 30.6 pg (ref 26.0–34.0)
MCHC: 30.4 g/dL (ref 30.0–36.0)
MCV: 100.4 fL — ABNORMAL HIGH (ref 80.0–100.0)
Platelets: 281 10*3/uL (ref 150–400)
RBC: 2.29 MIL/uL — ABNORMAL LOW (ref 3.87–5.11)
RDW: 23.2 % — ABNORMAL HIGH (ref 11.5–15.5)
WBC: 8.7 10*3/uL (ref 4.0–10.5)
nRBC: 1.3 % — ABNORMAL HIGH (ref 0.0–0.2)

## 2022-08-31 LAB — VITAMIN B12: Vitamin B-12: 556 pg/mL (ref 180–914)

## 2022-08-31 LAB — APTT: aPTT: 88 seconds — ABNORMAL HIGH (ref 24–36)

## 2022-08-31 LAB — MAGNESIUM: Magnesium: 2.1 mg/dL (ref 1.7–2.4)

## 2022-08-31 LAB — GROUP A STREP BY PCR: Group A Strep by PCR: NOT DETECTED

## 2022-08-31 LAB — HEPARIN LEVEL (UNFRACTIONATED): Heparin Unfractionated: >1.1 IU/mL — ABNORMAL HIGH (ref 0.30–0.70)

## 2022-08-31 LAB — FERRITIN: Ferritin: 345 ng/mL — ABNORMAL HIGH (ref 11–307)

## 2022-08-31 MED ORDER — FUROSEMIDE 10 MG/ML IJ SOLN
80.0000 mg | Freq: Once | INTRAMUSCULAR | Status: AC
  Administered 2022-08-31: 80 mg via INTRAVENOUS
  Filled 2022-08-31: qty 8

## 2022-08-31 MED ORDER — TECHNETIUM TO 99M ALBUMIN AGGREGATED
4.0000 | Freq: Once | INTRAVENOUS | Status: AC | PRN
  Administered 2022-08-31: 4 via INTRAVENOUS

## 2022-08-31 MED ORDER — HYDROCERIN EX CREA
TOPICAL_CREAM | Freq: Every day | CUTANEOUS | Status: DC
  Filled 2022-08-31: qty 113

## 2022-08-31 MED ORDER — SODIUM CHLORIDE 0.9% IV SOLUTION: Freq: Once | INTRAVENOUS | Status: AC

## 2022-08-31 NOTE — Progress Notes (Addendum)
Patient had 253m urine output. Patient has a purewick. Denies any discomfort or pressure in her bladder. Patient bladder scan result was 1083m MD notified via secure chat. SaFrancene FindersRN

## 2022-08-31 NOTE — Progress Notes (Addendum)
PROGRESS NOTE    Tracy Nelson  CWC:[id number] DOB: 05-17-1946 DOA: 08/30/2022 PCP: [name], [name]  77/F, chronically ill with chronic systolic CHF, NICM, EF 76-16% recently improved to 45%, CKD 4, paroxysmal A-fib/A-flutter, anemia of chronic disease, rheumatoid arthritis on Plaquenil and azathioprine, left hip fracture treated with IM nail 07/10/2022, postop went to rehab and was discharged home 5 days ago. -Presented to the ED with progressive dyspnea on exertion, orthopnea and cough. -Compliant with Bumex daily, took an extra dose day prior to admission -In the ED vital stable, hemoglobin 7.3, creatinine 2.9, bicarb 16, BNP> 4500, D-dimer 1.95, troponin 51 -Chest x-ray with small bilateral pleural effusions -Admitted, started on IV Lasix, IV heparin, VQ scan ordered  Subjective: -Bothered by her cough, feels swollen, especially her face  Assessment and Plan:  Acute on chronic HFrEF -Known NICM, previously EF 20-25%, last echo 12/23 with improvement to 07-37%, grade 1 diastolic dysfunction, normal RV -Followed by Dr. Aundra Dubin with advanced heart failure team, concern of end-stage CHF on recent eval, also complicated by CKD 4 and hypotension limiting GDMT -Continue Lasix 40 Mg twice daily, may need to increase dose -Continue midodrine, vericiguat -Will request CHF team evaluation  Elevated d-dimer -Likely nonspecific in the setting of long history of RA, clinical suspicion for acute PE is low, on eliquis at baseline, considering recent hip surgery and relative immobilization, started on IV heparin and VQ scan ordered on admission, will FU VQ scan -Eliquis on hold  Macrocytic anemia -Hemoglobin has trended down from 9-10 range over the last 3 months to 7 range now, she denies any overt bleeding or blood loss, on Eliquis at baseline -With macrocytic anemia concern that Plaquenil could be causing some bone marrow suppression, also check B12 folate, iron panel, denies EtOH use -Will  transfuse PRBC pending iron studies, likely tomorrow  CKD (chronic kidney disease), stage IV ([place]) Renal function relatively stable.  -Baseline creatinine around 3 follows with nephrology, Dr. Royce Macadamia.  Monitor labs closely with diuresis.  Paroxysmal atrial fibrillation (HCC) -Sinus rhythm at this time, continue amiodarone and IV heparin as above.  Rheumatoid arthritis involving multiple sites with positive rheumatoid factor (HCC) On chronic immunosuppressants.  Continue prednisone, Plaquenil, Imuran. -No indication for stress dose steroids at this time -Will double check with rheumatology if she is supposed to be on Plaquenil  DVT prophylaxis: IV heparin Code Status: Full code Family Communication: None present Disposition Plan: Home likely 2 to 3 days  Consultants:    Procedures:   Antimicrobials:    Objective: Vitals:   08/31/22 0100 08/31/22 0130 08/31/22 0315 08/31/22 0744  BP: 122/88 (!) 123/93 114/68 103/77  Pulse: (!) 103 (!) 103 (!) 104 98  Resp: (!) 26 (!) '22 20 19  '$ Temp:  98.4 F (36.9 C) 98.4 F (36.9 C) 98.2 F (36.8 C)  TempSrc:  Oral Oral Oral  SpO2: 98% 100% 98% 94%  Weight:  48 kg    Height:  '5\' 3"'$  (1.6 m)      Intake/Output Summary (Last 24 hours) at 08/31/2022 1007 Last data filed at 08/31/2022 0827 Gross per 24 hour  Intake 343 ml  Output --  Net 343 ml   Filed Weights   08/31/22 0130  Weight: 48 kg    Examination:  General exam: Elderly frail chronically ill female laying in bed, AAOx3 HEENT: Cushingoid facies unable to assess JVD CVS: S1-S2, regular rhythm Lungs: Decreased breath sounds at the bases Abdomen: Soft, nontender, bowel sounds present Extremities: Trace  edema Skin: No rashes Psychiatry: Flat affect    Data Reviewed:   CBC: Recent Labs  Lab 08/27/22 1658 08/30/22 1409 08/31/22 0715  WBC 7.0 9.2 8.7  NEUTROABS  --  6.9  --   HGB 7.3* 7.3* 7.0*  HCT 22.2* 23.7* 23.0*  MCV 91 101.3* 100.4*  PLT 286 275 026    Basic Metabolic Panel: Recent Labs  Lab 08/27/22 1658 08/30/22 1409 08/31/22 0715  NA 143 140 141  K 4.0 4.0 3.8  CL 108* 111 111  CO2 17* 16* 18*  GLUCOSE 123* 101* 105*  BUN 37* 46* 46*  CREATININE 2.46* 2.89* 2.84*  CALCIUM 9.1 8.8* 8.8*  MG  --   --  2.1   GFR: Estimated Creatinine Clearance: 12.6 mL/min (A) (by C-G formula based on SCr of 2.84 mg/dL (H)). Liver Function Tests: Recent Labs  Lab 08/30/22 1409  AST 25  ALT 18  ALKPHOS 73  BILITOT 0.4  PROT 6.3*  ALBUMIN 3.1*   No results for input(s): "LIPASE", "AMYLASE" in the last 168 hours. No results for input(s): "AMMONIA" in the last 168 hours. Coagulation Profile: No results for input(s): "INR", "PROTIME" in the last 168 hours. Cardiac Enzymes: No results for input(s): "CKTOTAL", "CKMB", "CKMBINDEX", "TROPONINI" in the last 168 hours. BNP (last 3 results) No results for input(s): "PROBNP" in the last 8760 hours. HbA1C: No results for input(s): "HGBA1C" in the last 72 hours. CBG: No results for input(s): "GLUCAP" in the last 168 hours. Lipid Profile: No results for input(s): "CHOL", "HDL", "LDLCALC", "TRIG", "CHOLHDL", "LDLDIRECT" in the last 72 hours. Thyroid Function Tests: No results for input(s): "TSH", "T4TOTAL", "FREET4", "T3FREE", "THYROIDAB" in the last 72 hours. Anemia Panel: No results for input(s): "VITAMINB12", "FOLATE", "FERRITIN", "TIBC", "IRON", "RETICCTPCT" in the last 72 hours. Urine analysis:    Component Value Date/Time   COLORURINE YELLOW 07/13/2022 0945   APPEARANCEUR CLEAR 07/13/2022 0945   LABSPEC 1.012 07/13/2022 0945   PHURINE 5.0 07/13/2022 0945   GLUCOSEU NEGATIVE 07/13/2022 0945   HGBUR NEGATIVE 07/13/2022 0945   BILIRUBINUR NEGATIVE 07/13/2022 0945   BILIRUBINUR negative 06/04/2021 1151   KETONESUR NEGATIVE 07/13/2022 0945   PROTEINUR NEGATIVE 07/13/2022 0945   UROBILINOGEN 0.2 06/04/2021 1151   NITRITE NEGATIVE 07/13/2022 0945   LEUKOCYTESUR NEGATIVE 07/13/2022  0945   Sepsis Labs: '@LABRCNTIP'$ (procalcitonin:4,lacticidven:4)  ) Recent Results (from the past 240 hour(s))  Resp panel by RT-PCR (RSV, Flu A&B, Covid) Anterior Nasal Swab     Status: None   Collection Time: 08/30/22  3:34 PM   Specimen: Anterior Nasal Swab  Result Value Ref Range Status   SARS Coronavirus 2 by RT PCR NEGATIVE NEGATIVE Final    Comment: (NOTE) SARS-CoV-2 target nucleic acids are NOT DETECTED.  The SARS-CoV-2 RNA is generally detectable in upper respiratory specimens during the acute phase of infection. The lowest concentration of SARS-CoV-2 viral copies this assay can detect is 138 copies/mL. A negative result does not preclude SARS-Cov-2 infection and should not be used as the sole basis for treatment or other patient management decisions. A negative result may occur with  improper specimen collection/handling, submission of specimen other than nasopharyngeal swab, presence of viral mutation(s) within the areas targeted by this assay, and inadequate number of viral copies(<138 copies/mL). A negative result must be combined with clinical observations, patient history, and epidemiological information. The expected result is Negative.  Fact Sheet for Patients:  EntrepreneurPulse.com.au  Fact Sheet for Healthcare Providers:  IncredibleEmployment.be  This test is no t  yet approved or cleared by the [place] and  has been authorized for detection and/or diagnosis of SARS-CoV-2 by FDA under an Emergency Use Authorization (EUA). This EUA will remain  in effect (meaning this test can be used) for the duration of the COVID-19 declaration under Section 564(b)(1) of the Act, 21 U.S.C.section 360bbb-3(b)(1), unless the authorization is terminated  or revoked sooner.       Influenza A by PCR NEGATIVE NEGATIVE Final   Influenza B by PCR NEGATIVE NEGATIVE Final    Comment: (NOTE) The Xpert Xpress SARS-CoV-2/FLU/RSV plus  assay is intended as an aid in the diagnosis of influenza from Nasopharyngeal swab specimens and should not be used as a sole basis for treatment. Nasal washings and aspirates are unacceptable for Xpert Xpress SARS-CoV-2/FLU/RSV testing.  Fact Sheet for Patients: EntrepreneurPulse.com.au  Fact Sheet for Healthcare Providers: IncredibleEmployment.be  This test is not yet approved or cleared by the [place] FDA and has been authorized for detection and/or diagnosis of SARS-CoV-2 by FDA under an Emergency Use Authorization (EUA). This EUA will remain in effect (meaning this test can be used) for the duration of the COVID-19 declaration under Section 564(b)(1) of the Act, 21 U.S.C. section 360bbb-3(b)(1), unless the authorization is terminated or revoked.     Resp Syncytial Virus by PCR NEGATIVE NEGATIVE Final    Comment: (NOTE) Fact Sheet for Patients: EntrepreneurPulse.com.au  Fact Sheet for Healthcare Providers: IncredibleEmployment.be  This test is not yet approved or cleared by the [place] FDA and has been authorized for detection and/or diagnosis of SARS-CoV-2 by FDA under an Emergency Use Authorization (EUA). This EUA will remain in effect (meaning this test can be used) for the duration of the COVID-19 declaration under Section 564(b)(1) of the Act, 21 U.S.C. section 360bbb-3(b)(1), unless the authorization is terminated or revoked.  Performed at [facility], [place] [address] [place], [place] [address]      Radiology Studies: DG Chest 2 View  Result Date: 08/30/2022 CLINICAL DATA:  Shortness of breath EXAM: CHEST - 2 VIEW COMPARISON:  Chest x-ray 07/08/2022 FINDINGS: The heart is enlarged, unchanged. There are small bilateral pleural effusions. There are minimal bibasilar patchy opacities, left greater than right. There is no evidence for pneumothorax. No acute fractures are seen.  IMPRESSION: 1. Small bilateral pleural effusions. 2. Minimal bibasilar patchy opacities, left greater than right, may represent atelectasis or infection. Electronically Signed   By: Ronney Asters M.D.   On: 08/30/2022 15:19     Scheduled Meds:  amiodarone  200 mg Oral Daily   azaTHIOprine  100 mg Oral Daily   folic acid  1 mg Oral Daily   furosemide  40 mg Intravenous BID   hydrocerin   Topical Daily   hydroxychloroquine  200 mg Oral Daily   lidocaine  1 patch Transdermal Q24H   midodrine  5 mg Oral TID WC   predniSONE  5 mg Oral Q breakfast   sodium bicarbonate  650 mg Oral Daily   sodium chloride flush  3 mL Intravenous Q12H   Vericiguat  10 mg Oral Daily   Continuous Infusions:  heparin 800 Units/hr (08/30/22 2126)     LOS: 1 day    Time spent: 44mn    PDomenic Polite MD [facility]   08/31/2022, 10:07 AM

## 2022-08-31 NOTE — Plan of Care (Signed)
  Problem: Education: Goal: Ability to demonstrate management of disease process will improve Outcome: Progressing Goal: Ability to verbalize understanding of medication therapies will improve Outcome: Progressing   

## 2022-08-31 NOTE — Consult Note (Addendum)
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 Broadus John, hospital medicine.   Tracy Nelson is a 77 y.o. AAF with history of rheumatoid arthritis, atrial fibrillation, CKD stage 3, post-herpetic neuralgia (shingles L upper back 3/23) and nonischemic cardiomyopathy. She was referred by Dr. Andrey Cota in [place] to establish heart failure care in [place].  Patient has been known to have a cardiomyopathy since 2015.      She was evaluated for [facility] or ANTHEM, BNP too high and thought to be too frail.   She was recently discharged from [facility] 1[facility]. Has been home since. Recently had hip surgery in December, still with tenderness. Over the past weekend she reports increased fatigue, weakness, SOB, and a cough. Had been present since being home but worsened over the weekend. She took an additional Bumex at home Sunday without much improvement in her UOP.  Reports her appetite has been good. She is up 5lbs since her clinic visit with [place] 08/04/22. She is able to get around with her walker. Reports full compliance with meds.   In the ED labs significant for, BNP >4500, Hgb 7.3, WBC 9.2, BUN 46, Cr 2.9, D-dimer 1.95, HsTrop 51>52, resp panel (-). CXR with small bilateral pleural effusions . VQ scan (-). Placed on hep gtt and started on 40 IV lasix.   In bed in no acute distress. Denies CP or SOB. States lasix has been helping. Heel foam applied to R heel.   Cardiac studies reviewed:  12/23: Echo EF improved to 45% Cardiac MRI in 10/22 showed mild LV dilation with EF 23%, normal RV size with RVEF 37%, ECV 34%, small area of subendocardial LGE in the mid inferolateral wall and small area of full thickness LGE in the basal   inferior wall.  PYP scan 4/22 was grade 1 with H/CL 1.48, equivocal.  PYP scan repeated in 3/23 and likely negative.  TEE-DCCV 4/22.  TEE showed EF < 20%, moderate LV dilation, mild RV dilation with moderate RV dysfunction, moderate central MR.   CPX in 3/22 showed severe HF limitation. 12/21, EF < 20% with moderate LV dilation and mildly decreased RV systolic function.  RHC 12/21 low filling pressures and relatively preserved cardiac index at 2.22.  11/21 echo EF 20-25%,  global hypokinesis, mildly decreased RV systolic function. 3/21, echo showed EF down to 20-25% cMRI 15' showed EF 37% with no LGE.   15' Coronary angiography: no significant coronary disease.   Home Medications Prior to Admission medications   Medication Sig Start Date End Date Taking? Authorizing Provider  amiodarone (PACERONE) 200 MG tablet TAKE 1 TABLET BY MOUTH EVERY DAY Patient taking differently: Take 200 mg by mouth daily. 12/19/21  Yes [name], MD  azaTHIOprine (IMURAN) 50 MG tablet TAKE 2 TABLETS BY MOUTH EVERY DAY Patient taking differently: Take 100 mg by mouth daily. 04/20/22  Yes Rice, Resa Miner, MD  bumetanide (BUMEX) 1 MG tablet Take 1 mg by mouth daily.   Yes [provider]  calcitRIOL (ROCALTROL) 0.25 MCG capsule Take 0.25 mcg by mouth daily. 06/25/22  Yes [provider]  ELIQUIS 2.5 MG TABS tablet TAKE 1 TABLET BY MOUTH TWICE A DAY Patient taking differently: Take 2.5 mg by mouth  2 (two) times daily. 12/31/21  Yes [name], MD  Ensure (ENSURE) Take 237 mLs by mouth daily as needed (for meal supplement).   Yes [provider]  folic acid (FOLVITE) 1 MG tablet TAKE 1 TABLET BY MOUTH EVERY DAY Patient taking differently: Take 1 mg by mouth daily. 07/22/22  Yes [name], MD  hydroxychloroquine (PLAQUENIL) 200 MG tablet TAKE 1 TABLET BY MOUTH EVERY DAY Patient taking differently: Take 200 mg by mouth daily. 05/29/22  Yes [name], MD   hydroxypropyl methylcellulose / hypromellose (ISOPTO TEARS / GONIOVISC) 2.5 % ophthalmic solution Place 1 drop into both eyes as needed for dry eyes.   Yes [provider]  lidocaine (LIDODERM) 5 % Place 1 patch onto the skin daily. Remove & Discard patch within 12 hours or as directed by MD Patient taking differently: Place 1 patch onto the skin daily as needed (for shoulder pain). Remove & Discard patch within 12 hours or as directed by MD 03/09/22  Yes [name], [name]  Menthol, Topical Analgesic, (ICY HOT BACK EX) Apply 1 application topically daily as needed (pain).   Yes [provider]  midodrine (PROAMATINE) 5 MG tablet Take 1 tablet (5 mg total) by mouth 3 (three) times daily with meals. 02/04/22  Yes [name], MD  Multiple Vitamin (MULTIVITAMIN) tablet Take 1 tablet by mouth daily.   Yes [provider]  omeprazole (PRILOSEC) 40 MG capsule Take 40 mg by mouth daily as needed (for acid reflux).   Yes [provider]  polyethylene glycol (MIRALAX / GLYCOLAX) 17 g packet Take 17 g by mouth daily as needed for mild constipation.   Yes [provider]  predniSONE (DELTASONE) 5 MG tablet TAKE 1 TABLET BY MOUTH EVERY DAY WITH BREAKFAST Patient taking differently: Take 5 mg by mouth daily with breakfast. 05/04/22  Yes Rice, Resa Miner, MD  Vericiguat (VERQUVO) 10 MG TABS Take 10 mg by mouth daily. 09/03/21  Yes [name], MD  sodium bicarbonate 650 MG tablet Take 650 mg by mouth daily. Patient not taking: Reported on 08/30/2022 08/26/22   [provider]    Past Medical History: Past Medical History:  Diagnosis Date   Acid reflux 07/15/2020   Arthritis 07/15/2020   CHF (congestive heart failure) ([place])    Colitis 07/15/2020   Dilated cardiomyopathy ([place]) 07/15/2020   HFrEF (heart failure with reduced ejection fraction) ([place]) 07/15/2020   HTN (hypertension) 07/15/2020   Osteoporosis 07/15/2020   Palpitations 07/15/2020    Rheumatoid arthritis ([place]) 07/15/2020    Past Surgical History: Past Surgical History:  Procedure Laterality Date   CARDIOVERSION N/A 11/08/2020   Procedure: CARDIOVERSION;  Surgeon: [name], MD;  Location: [place];  Service: Cardiovascular;  Laterality: N/A;   INTRAMEDULLARY (IM) NAIL INTERTROCHANTERIC Left 07/10/2022   Procedure: INTRAMEDULLARY (IM) NAIL INTERTROCHANTERIC;  Surgeon: Leandrew Koyanagi, MD;  Location: [facility];  Service: Orthopedics;  Laterality: Left;   RIGHT HEART CATH N/A 08/14/2020   Procedure: RIGHT HEART CATH;  Surgeon: [name], MD;  Location: [facility] CV [facility];  Service: Cardiovascular;  Laterality: N/A;   TEE WITHOUT CARDIOVERSION N/A 11/08/2020   Procedure: TRANSESOPHAGEAL ECHOCARDIOGRAM (TEE);  Surgeon: [name], MD;  Location[facility];  Service: Cardiovascular;  Laterality: N/A;    Family History: Family History  Problem Relation Age of Onset   Hypertension Mother    Diabetes Mother    Hypertension Father    Diabetes Father  Breast cancer Maternal Aunt    Breast cancer Paternal Aunt    Arthritis Maternal Grandmother    Lung disease Paternal Grandfather    Cancer Brother     Social History: Social History   Socioeconomic History   Marital status: Widowed    Spouse name: Not on file   Number of children: Not on file   Years of education: 16   Highest education level: Bachelor's degree (e.g., BA, AB, BS)  Occupational History   Occupation: Retired  Tobacco Use   Smoking status: Never   Smokeless tobacco: Never  Vaping Use   Vaping Use: Never used  Substance and Sexual Activity   Alcohol use: Never   Drug use: Never   Sexual activity: Not Currently  Other Topics Concern   Not on file  Social History Narrative   Not on file   Social Determinants of Health   Financial Resource Strain: Low Risk  (04/30/2022)   Overall Financial Resource Strain (CARDIA)    Difficulty of Paying Living Expenses: Not hard at all  Food  Insecurity: No Food Insecurity (08/31/2022)   Hunger Vital Sign    Worried About Running Out of Food in the Last Year: Never true    Ran Out of Food in the Last Year: Never true  Transportation Needs: No Transportation Needs (08/31/2022)   PRAPARE - Hydrologist (Medical): No    Lack of Transportation (Non-Medical): No  Recent Concern: Transportation Needs - Unmet Transportation Needs (07/09/2022)   PRAPARE - Transportation    Lack of Transportation (Medical): Yes    Lack of Transportation (Non-Medical): Yes  Physical Activity: Inactive (04/30/2022)   Exercise Vital Sign    Days of Exercise per Week: 0 days    Minutes of Exercise per Session: 0 min  Stress: No Stress Concern Present (04/30/2022)   [place]    Feeling of Stress : Not at all  Social Connections: Not on file    Allergies:  Allergies  Allergen Reactions   Baclofen Nausea And Vomiting   Penicillin G     Other reaction(s): Unknown   Penicillins Rash    Objective:    Vital Signs:   Temp:  [97.9 F (36.6 C)-98.6 F (37 C)] 98.2 F (36.8 C) (01/22 0744) Pulse Rate:  [98-109] 98 (01/22 0744) Resp:  [19-26] 19 (01/22 0744) BP: (103-123)/(68-93) 103/77 (01/22 0744) SpO2:  [94 %-100 %] 94 % (01/22 0744) Weight:  [48 kg] 48 kg (01/22 0130) Last BM Date : 08/29/22 (accgd to patient)  Weight change: Filed Weights   08/31/22 0130  Weight: 48 kg    Intake/Output:   Intake/Output Summary (Last 24 hours) at 08/31/2022 1106 Last data filed at 08/31/2022 0827 Gross per 24 hour  Intake 343 ml  Output --  Net 343 ml      Physical Exam  General:  chronically ill appearing.  No respiratory difficulty HEENT: swollen face, large tongue, white patches seen in throat. Swollen lymph nodes L>R Neck: supple. JVD ~9 cm. Carotids 2+ bilat; no bruits. No lymphadenopathy or thyromegaly appreciated. Cor: PMI nondisplaced. Regular  rate & rhythm. No rubs, gallops or murmurs. Lungs: clear, diminished Abdomen: soft, nontender, nondistended. No hepatosplenomegaly. No bruits or masses. Good bowel sounds. Extremities: no cyanosis, clubbing, rash, edema. Pressure sore to R heel. L hip with healing incision.  Neuro: alert & oriented x 3, cranial nerves grossly intact. moves all 4 extremities w/o difficulty.  Affect pleasant.   Telemetry   NSR 90s 4-15 PVCs/hr (Personally reviewed)    EKG    NSR 100 bpm   Labs   Basic Metabolic Panel: Recent Labs  Lab 08/27/22 1658 08/30/22 1409 08/31/22 0715  NA 143 140 141  K 4.0 4.0 3.8  CL 108* 111 111  CO2 17* 16* 18*  GLUCOSE 123* 101* 105*  BUN 37* 46* 46*  CREATININE 2.46* 2.89* 2.84*  CALCIUM 9.1 8.8* 8.8*  MG  --   --  2.1    Liver Function Tests: Recent Labs  Lab 08/30/22 1409  AST 25  ALT 18  ALKPHOS 73  BILITOT 0.4  PROT 6.3*  ALBUMIN 3.1*   No results for input(s): "LIPASE", "AMYLASE" in the last 168 hours. No results for input(s): "AMMONIA" in the last 168 hours.  CBC: Recent Labs  Lab 08/27/22 1658 08/30/22 1409 08/31/22 0715  WBC 7.0 9.2 8.7  NEUTROABS  --  6.9  --   HGB 7.3* 7.3* 7.0*  HCT 22.2* 23.7* 23.0*  MCV 91 101.3* 100.4*  PLT 286 275 281    Cardiac Enzymes: No results for input(s): "CKTOTAL", "CKMB", "CKMBINDEX", "TROPONINI" in the last 168 hours.  BNP: BNP (last 3 results) Recent Labs    07/07/22 1515 08/27/22 1658 08/30/22 1409  BNP 232.0* >5000.0* >4,500.0*    ProBNP (last 3 results) No results for input(s): "PROBNP" in the last 8760 hours.   CBG: No results for input(s): "GLUCAP" in the last 168 hours.  Coagulation Studies: No results for input(s): "LABPROT", "INR" in the last 72 hours.   Imaging   NM Pulmonary Perfusion  Result Date: 08/31/2022 CLINICAL DATA:  Pulmonary embolism suspected. Low to intermediate clinical probability. Positive D-dimer. Cough. EXAM: NUCLEAR MEDICINE PERFUSION LUNG SCAN  TECHNIQUE: Perfusion images were obtained in multiple projections after intravenous injection of radiopharmaceutical. Ventilation scans intentionally deferred if perfusion scan and chest x-ray adequate for interpretation during COVID 19 epidemic. RADIOPHARMACEUTICALS:  4.0 mCi Tc-43mMAA IV COMPARISON:  Chest radiography 08/30/2022 FINDINGS: No perfusion defect to suggest pulmonary embolism. Enlarged heart shadow. Minimal pleural blunting consistent with the small pleural effusions. IMPRESSION: No perfusion defect to suggest pulmonary emboli. Cardiomegaly. Small pleural effusions. Electronically Signed   By: MNelson ChimesM.D.   On: 08/31/2022 10:36   DG Chest 2 View  Result Date: 08/30/2022 CLINICAL DATA:  Shortness of breath EXAM: CHEST - 2 VIEW COMPARISON:  Chest x-ray 07/08/2022 FINDINGS: The heart is enlarged, unchanged. There are small bilateral pleural effusions. There are minimal bibasilar patchy opacities, left greater than right. There is no evidence for pneumothorax. No acute fractures are seen. IMPRESSION: 1. Small bilateral pleural effusions. 2. Minimal bibasilar patchy opacities, left greater than right, may represent atelectasis or infection. Electronically Signed   By: ARonney AstersM.D.   On: 08/30/2022 15:19     Medications:     Current Medications:  amiodarone  200 mg Oral Daily   azaTHIOprine  100 mg Oral Daily   folic acid  1 mg Oral Daily   furosemide  40 mg Intravenous BID   hydrocerin   Topical Daily   hydroxychloroquine  200 mg Oral Daily   lidocaine  1 patch Transdermal Q24H   midodrine  5 mg Oral TID WC   predniSONE  5 mg Oral Q breakfast   sodium bicarbonate  650 mg Oral Daily   sodium chloride flush  3 mL Intravenous Q12H   Vericiguat  10 mg Oral Daily  Infusions:  heparin 800 Units/hr (08/30/22 2126)      Patient Profile   Ms. Westmoreland is a 77 y.o. AAF with history of rheumatoid arthritis, atrial fibrillation, CKD stage 3, post-herpetic neuralgia  (shingles L upper back 3/23) and nonischemic cardiomyopathy. 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   Assessment/Plan  1. 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   - Echo 07/2022 EF improved 45-50%. RV normal  - NYHA class IIIb/IV on admission - Volume status difficult to access with swollen lymph nodes/facial swelling. BNP >4,500 though JVD does not appear too elevated. Up 5 lbs since 08/04/22.  - Currently on 40 IV lasix BID. Will increase to 80 BID through today. Follow UOP.  - Continue midodrine 5 mg tid. BP stable - Continue Verquvo 10 mg daily.  - Off dapagliflozin with rise in creatinine, BP appears more stable, will consider adding once renal function stable.  - Off spironolactone with elevated creatinine and soft BP.  - Does not have an ICD,and she is out of range with EF 45-50%. - check echo - Strict I&O, daily weights  2. Elevated troponin - LHC 15': No CAD - HsTrop 51>52. EKG stable. - Denies CP, suspect demand ischemia.   2. CKD: Stage 4.   - She is followed by Dr. Royce Macadamia. - Baseline appears to be around 2.4-2.6 - 2.84 today, follow with diuresis - avoid hypotension  3. Rheumatoid arthritis: No history of lung involvement. On low dose of prednisone chronically.   4. Atrial fibrillation/flutter: S/p TEE-DCCV in 4/22.  She is now on amiodarone and in NSR.  - Continue amiodarone 200 mg daily. LFTs and TSH normal (11/23). - Eliquis held? Now on hep gtt.   5. Shingles: Resolved.   6. Left Hip Fracture - S/P IM [place] 07/2022 - will consult PT  7. Swollen lymph nodes - primarily L side - white patches seen in back of throat - discussed with primary  8. Microcytic anemia - 2/2 plaquenil? - per primary - Hgb 7   Length of Stay: [place], NP  08/31/2022, 11:06 AM  Advanced Heart Failure Team Pager [phone] (M-F; 7a - 5p)  Please contact [facility] for night-coverage  after hours (4p -7a ) and weekends on [email or web address]

## 2022-08-31 NOTE — Consult Note (Signed)
WOC Nurse Consult Note: Reason for Consult:Deep tissue pressure injury to right posterior heel. Wound type:pressure Pressure Injury POA: Yes Measurement:2cm x 1cm with no depth; purple/maroon discoloration of skin Wound bed:As noted above Drainage (amount, consistency, odor) None Periwound: with peeling epidermis to 4cm. Dry, intact skin beneath. Dressing procedure/placement/frequency: I have provided guidance ofr Nursing in the care of this skin injury, and also the left lateral heel callus. Patient reports that the left lateral heel was also a pressure injury at one time, but that that skin injury has resolved. Skin care to the bilateral LEs will be with a daily cleanse using our house skin cleanser (pH balanced, no rinse) and pat dry. A thin layer of Eucerin cream is to follow and is not to be applied between digits. The right heel wound will be covered with xeroform gauze (antimicrobial, nonadherent) and topped with dry gauze, then secured with a few turns of Kerlix roll gauze/paper tape and the patient nonskid sock applied.. The left heel is to be placed into a nonskid sock. The heels are to be floated while in bed. A pressure redistribution chair cushion is to be placed in the chair and may be taken home with the patient at time of discharge. A sacral foam is to be placed for PI prevention.  [place] nursing team will not follow, but will remain available to this patient, the nursing and medical teams.  Please re-consult if needed.  Thank you for inviting [place] to participate in this patient's Plan of Care.  Maudie Flakes, MSN, RN, CNS, [place], Serita Grammes, [employer], [employer] phone:  [phone]

## 2022-08-31 NOTE — Progress Notes (Signed)
ANTICOAGULATION CONSULT NOTE - Initial Consult  Pharmacy Consult for Heparin Indication:  rule out PE  Allergies  Allergen Reactions   Baclofen Nausea And Vomiting   Penicillin G     Other reaction(s): Unknown   Penicillins Rash    Patient Measurements: Height: '5\' 3"'$  (160 cm) Weight: 48 kg (105 lb 13.1 oz) IBW/kg (Calculated) : 52.4 Heparin Dosing Weight: 45.5 kg  Vital Signs: Temp: 98.2 F (36.8 C) (01/22 0744) Temp Source: Oral (01/22 0744) BP: 103/77 (01/22 0744) Pulse Rate: 98 (01/22 0744)  Labs: Recent Labs    08/30/22 1409 08/30/22 1610 08/30/22 2016 08/30/22 2123 08/31/22 0715  HGB 7.3*  --   --   --  7.0*  HCT 23.7*  --   --   --  23.0*  PLT 275  --   --   --  281  APTT  --   --   --  30 88*  HEPARINUNFRC  --   --   --  >1.10* >1.10*  CREATININE 2.89*  --   --   --  2.84*  TROPONINIHS  --  51* 52*  --   --     Estimated Creatinine Clearance: 12.6 mL/min (A) (by C-G formula based on SCr of 2.84 mg/dL (H)).   Medical History: Past Medical History:  Diagnosis Date   Acid reflux 07/15/2020   Arthritis 07/15/2020   CHF (congestive heart failure) ([facility])    Colitis 07/15/2020   Dilated cardiomyopathy ([employer]) 07/15/2020   HFrEF (heart failure with reduced ejection fraction) ([place]) 07/15/2020   HTN (hypertension) 07/15/2020   Osteoporosis 07/15/2020   Palpitations 07/15/2020   Rheumatoid arthritis ([place]) 07/15/2020      Assessment: 77 yo W with elevated d-dimer and suspected PE. Patient is on apixaban prior to admission for afib. Last dose 1/20pm, per patient. Heparin per pharmacy consult placed for  rule out PE .   Heparin level >1.1 is still affected by apixaban. APTT  88  is therapeutic on 800 units/hr. CBC stable. VQ scan is pending.  Goal of Therapy:  Heparin level 0.3-0.7 units/ml aPTT 66-102 seconds Monitor platelets by anticoagulation protocol: Yes   Plan:  Heparin at 800 units/hr F/u aPTT until correlates with heparin level  Monitor daily aPTT,  heparin level, CBC Monitor for signs/symptoms of bleeding    Benetta Spar, PharmD, BCPS, BCCP Clinical Pharmacist  Please check AMION for all [place] phone numbers After 10:00 PM, call [place]

## 2022-08-31 NOTE — TOC Initial Note (Addendum)
Transition of Care [facility]) - Initial/Assessment Note    Patient Details  Name: Tracy Nelson MRN: [id number] Date of Birth: [birth year]  Transition of Care [facility]) CM/SW Contact:    Zenon Mayo, RN Phone Number: 08/31/2022, 12:39 PM  Clinical Narrative:                 From home with daughter and her family,  from home with daughter, CHF, conts on iv lasix, hep drip to r/o PE.  She is active with [facility].  TOC following.  Expected Discharge Plan: Home/Self Care Barriers to Discharge: Continued Medical Work up   Patient Goals and CMS Choice Patient states their goals for this hospitalization and ongoing recovery are:: return home   Choice offered to / list presented to : NA      Expected Discharge Plan and Services In-house Referral: NA Discharge Planning Services: CM Consult Post Acute Care Choice: NA Living arrangements for the past 2 months: Single Family Home                 DME Arranged: N/A DME Agency: NA       HH Arranged: NA          Prior Living Arrangements/Services Living arrangements for the past 2 months: Single Family Home Lives with:: Adult Children Patient language and need for interpreter reviewed:: Yes Do you feel safe going back to the place where you live?: Yes      Need for Family Participation in Patient Care: Yes (Comment) Care giver support system in place?: Yes (comment)   Criminal Activity/Legal Involvement Pertinent to Current Situation/Hospitalization: No - Comment as needed  Activities of Daily Living Home Assistive Devices/Equipment: Walker (specify type) ADL Screening (condition at time of admission) Patient's cognitive ability adequate to safely complete daily activities?: No Is the patient deaf or have difficulty hearing?: No Does the patient have difficulty seeing, even when wearing glasses/contacts?: No Does the patient have difficulty concentrating, remembering, or making decisions?: No Patient able to express  need for assistance with ADLs?: Yes Does the patient have difficulty dressing or bathing?: No Independently performs ADLs?: Yes (appropriate for developmental age) Does the patient have difficulty walking or climbing stairs?: No Weakness of Legs: Left Weakness of Arms/Hands: None  Permission Sought/Granted                  Emotional Assessment   Attitude/Demeanor/Rapport: Engaged Affect (typically observed): Appropriate Orientation: : Oriented to Self, Oriented to Place, Oriented to  Time, Oriented to Situation Alcohol / Substance Use: Not Applicable Psych Involvement: No (comment)  Admission diagnosis:  Positive D dimer [R79.89] Acute on chronic HFrEF (heart failure with reduced ejection fraction) (HCC) [I50.23] Congestive heart failure, unspecified HF chronicity, unspecified heart failure type ([place]) [I50.9] Patient Active Problem List   Diagnosis Date Noted   Acute on chronic HFrEF (heart failure with reduced ejection fraction) ([place]) 08/30/2022   Elevated d-dimer 08/30/2022   Anemia of chronic disease 08/30/2022   Paroxysmal atrial fibrillation ([place]) 44/31/5400   Chronic systolic CHF (congestive heart failure) ([place]) 07/13/2022   CKD (chronic kidney disease), stage IV ([place]) 07/09/2022   Displaced intertrochanteric fracture of left femur, initial encounter for closed fracture ([place]) 07/08/2022   Corneal melt, 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   Monoclonal gammopathy 03/10/2021   Hypotension 10/22/2020   Atypical chest pain 10/22/2020   CKD (chronic kidney disease), stage III ([place]) 10/22/2020   Nausea & vomiting 10/22/2020   Physical deconditioning 10/22/2020   Atrial flutter ([place]) 10/18/2020  Arthritis 07/15/2020   GERD (gastroesophageal reflux disease) 07/15/2020   Colitis 07/15/2020   Non-ischemic cardiomyopathy ([place]) 07/15/2020   HFrEF (heart failure with reduced ejection fraction) ([place]) 07/15/2020   HTN (hypertension) 07/15/2020   Osteoporosis 07/15/2020   Palpitations  07/15/2020   Acute gout of left shoulder 07/10/2020   Rheumatoid arthritis involving multiple sites with positive rheumatoid factor ([place]) 10/28/2015   Mitral regurgitation 05/30/2014   Acute on chronic combined systolic (congestive) and diastolic (congestive) heart failure ([place]) 05/19/2014   Vitamin D deficiency 05/15/2013   Encounter for long-term (current) use of other medications 12/14/2008   Sjogren's syndrome ([place]) 12/14/2008   PCP:  [name], [place] Pharmacy:   [id number] [place] [id number]: [phone]: [phone]    Social Determinants of Health (SDOH) Social History: SDOH Screenings   Food Insecurity: No Food Insecurity (08/31/2022)  Housing: Low Risk  (08/31/2022)  Transportation Needs: No Transportation Needs (08/31/2022)  Recent Concern: Transportation Needs - Unmet Transportation Needs (07/09/2022)  Utilities: Not At Risk (08/31/2022)  Depression (PHQ2-9): Low Risk  (08/27/2022)  Financial Resource Strain: Low Risk  (04/30/2022)  Physical Activity: Inactive (04/30/2022)  Stress: No Stress Concern Present (04/30/2022)  Tobacco Use: Low Risk  (08/30/2022)   SDOH Interventions:     Readmission Risk Interventions    08/31/2022   12:35 PM  Readmission Risk Prevention Plan  Transportation Screening Complete  [place] or [place]  Palliative Care Screening Not Applicable  Medication Review (RN Care Manager) Complete

## 2022-08-31 NOTE — Progress Notes (Signed)
Heart Failure Navigator Progress Note  Assessed for Heart & Vascular TOC clinic readiness.  Patient does not meet criteria due to Advanced Heart Failure Team patient.   Navigator will sign off at this time.    Sanyiah Kanzler, BSN, RN Heart Failure Nurse Navigator Secure Chat Only   

## 2022-09-01 ENCOUNTER — Ambulatory Visit: Payer: Medicare PPO | Admitting: Orthopaedic Surgery

## 2022-09-01 ENCOUNTER — Inpatient Hospital Stay (HOSPITAL_COMMUNITY): Payer: Medicare PPO

## 2022-09-01 ENCOUNTER — Inpatient Hospital Stay: Payer: Self-pay

## 2022-09-01 ENCOUNTER — Encounter (HOSPITAL_COMMUNITY): Payer: Self-pay

## 2022-09-01 ENCOUNTER — Inpatient Hospital Stay (HOSPITAL_COMMUNITY): Admission: RE | Admit: 2022-09-01 | Payer: Medicare PPO | Source: Ambulatory Visit

## 2022-09-01 DIAGNOSIS — I428 Other cardiomyopathies: Secondary | ICD-10-CM

## 2022-09-01 DIAGNOSIS — I5023 Acute on chronic systolic (congestive) heart failure: Secondary | ICD-10-CM | POA: Diagnosis not present

## 2022-09-01 LAB — TYPE AND SCREEN
ABO/RH(D): O POS
Antibody Screen: NEGATIVE
Unit division: 0

## 2022-09-01 LAB — LACTIC ACID, PLASMA: Lactic Acid, Venous: 1.5 mmol/L (ref 0.5–1.9)

## 2022-09-01 LAB — ECHOCARDIOGRAM LIMITED
Est EF: <20
Height: 63 in
MV M vel: 4.71 m/s
MV Peak grad: 88.7 mmHg
MV VTI: 3.41 cm2
P 1/2 time: 344 msec
Radius: 0.35 cm
S' Lateral: 4.2 cm
Weight: 1731.93 oz

## 2022-09-01 LAB — CBC
HCT: 29.3 % — ABNORMAL LOW (ref 36.0–46.0)
Hemoglobin: 9 g/dL — ABNORMAL LOW (ref 12.0–15.0)
MCH: 29 pg (ref 26.0–34.0)
MCHC: 30.7 g/dL (ref 30.0–36.0)
MCV: 94.5 fL (ref 80.0–100.0)
Platelets: 267 10*3/uL (ref 150–400)
RBC: 3.1 MIL/uL — ABNORMAL LOW (ref 3.87–5.11)
RDW: 24.8 % — ABNORMAL HIGH (ref 11.5–15.5)
WBC: 7.7 10*3/uL (ref 4.0–10.5)
nRBC: 1.3 % — ABNORMAL HIGH (ref 0.0–0.2)

## 2022-09-01 LAB — BASIC METABOLIC PANEL
Anion gap: 13 (ref 5–15)
BUN: 54 mg/dL — ABNORMAL HIGH (ref 8–23)
CO2: 16 mmol/L — ABNORMAL LOW (ref 22–32)
Calcium: 8.7 mg/dL — ABNORMAL LOW (ref 8.9–10.3)
Chloride: 109 mmol/L (ref 98–111)
Creatinine, Ser: 3.23 mg/dL — ABNORMAL HIGH (ref 0.44–1.00)
GFR, Estimated: 14 mL/min — ABNORMAL LOW (ref >60)
Glucose, Bld: 133 mg/dL — ABNORMAL HIGH (ref 70–99)
Potassium: 4.1 mmol/L (ref 3.5–5.1)
Sodium: 138 mmol/L (ref 135–145)

## 2022-09-01 LAB — COMPREHENSIVE METABOLIC PANEL
ALT: 21 U/L (ref 0–44)
AST: 26 U/L (ref 15–41)
Albumin: 3.2 g/dL — ABNORMAL LOW (ref 3.5–5.0)
Alkaline Phosphatase: 96 U/L (ref 38–126)
Anion gap: 15 (ref 5–15)
BUN: 56 mg/dL — ABNORMAL HIGH (ref 8–23)
CO2: 16 mmol/L — ABNORMAL LOW (ref 22–32)
Calcium: 9 mg/dL (ref 8.9–10.3)
Chloride: 107 mmol/L (ref 98–111)
Creatinine, Ser: 3.38 mg/dL — ABNORMAL HIGH (ref 0.44–1.00)
GFR, Estimated: 13 mL/min — ABNORMAL LOW (ref >60)
Glucose, Bld: 168 mg/dL — ABNORMAL HIGH (ref 70–99)
Potassium: 4.3 mmol/L (ref 3.5–5.1)
Sodium: 138 mmol/L (ref 135–145)
Total Bilirubin: 1.1 mg/dL (ref 0.3–1.2)
Total Protein: 6.2 g/dL — ABNORMAL LOW (ref 6.5–8.1)

## 2022-09-01 LAB — BPAM RBC
Blood Product Expiration Date: 202402252359
ISSUE DATE / TIME: 202401222057
Unit Type and Rh: 5100

## 2022-09-01 LAB — COOXEMETRY PANEL
Carboxyhemoglobin: 1.1 % (ref 0.5–1.5)
Methemoglobin: <0.7 % (ref 0.0–1.5)
O2 Saturation: 36.5 %
Total hemoglobin: 8.6 g/dL — ABNORMAL LOW (ref 12.0–16.0)

## 2022-09-01 LAB — APTT: aPTT: 100 seconds — ABNORMAL HIGH (ref 24–36)

## 2022-09-01 LAB — HEPARIN LEVEL (UNFRACTIONATED): Heparin Unfractionated: 1.09 IU/mL — ABNORMAL HIGH (ref 0.30–0.70)

## 2022-09-01 MED ORDER — ALUM & MAG HYDROXIDE-SIMETH 200-200-20 MG/5ML PO SUSP
30.0000 mL | ORAL | Status: DC | PRN
  Administered 2022-09-01: 30 mL via ORAL

## 2022-09-01 MED ORDER — SODIUM CHLORIDE 0.9 % IV SOLN
510.0000 mg | Freq: Once | INTRAVENOUS | Status: AC
  Administered 2022-09-01: 510 mg via INTRAVENOUS
  Filled 2022-09-01: qty 17

## 2022-09-01 MED ORDER — CHLORHEXIDINE GLUCONATE CLOTH 2 % EX PADS
6.0000 | MEDICATED_PAD | Freq: Every day | CUTANEOUS | Status: DC
  Administered 2022-09-01 – 2022-09-11 (×11): 6 via TOPICAL

## 2022-09-01 MED ORDER — FUROSEMIDE 10 MG/ML IJ SOLN
80.0000 mg | Freq: Two times a day (BID) | INTRAMUSCULAR | Status: DC
  Administered 2022-09-01 – 2022-09-02 (×3): 80 mg via INTRAVENOUS
  Filled 2022-09-01 (×3): qty 8

## 2022-09-01 MED ORDER — PANTOPRAZOLE SODIUM 40 MG PO TBEC
80.0000 mg | DELAYED_RELEASE_TABLET | Freq: Every day | ORAL | Status: DC
  Administered 2022-09-01 – 2022-09-11 (×11): 80 mg via ORAL
  Filled 2022-09-01 (×11): qty 2

## 2022-09-01 MED ORDER — SODIUM CHLORIDE 0.9% FLUSH
10.0000 mL | Freq: Two times a day (BID) | INTRAVENOUS | Status: DC
  Administered 2022-09-01 – 2022-09-08 (×10): 10 mL
  Administered 2022-09-09: 20 mL
  Administered 2022-09-09 – 2022-09-11 (×4): 10 mL

## 2022-09-01 MED ORDER — MILRINONE LACTATE IN DEXTROSE 20-5 MG/100ML-% IV SOLN
0.2500 ug/kg/min | INTRAVENOUS | Status: DC
  Administered 2022-09-01 – 2022-09-03 (×3): 0.25 ug/kg/min via INTRAVENOUS
  Filled 2022-09-01 (×3): qty 100

## 2022-09-01 MED ORDER — SODIUM CHLORIDE 0.9% FLUSH: 10.0000 mL | INTRAVENOUS | Status: DC | PRN

## 2022-09-01 MED ORDER — APIXABAN 2.5 MG PO TABS
2.5000 mg | ORAL_TABLET | Freq: Two times a day (BID) | ORAL | Status: DC
  Administered 2022-09-01 – 2022-09-11 (×21): 2.5 mg via ORAL
  Filled 2022-09-01 (×22): qty 1

## 2022-09-01 NOTE — Progress Notes (Signed)
  Echocardiogram 2D Echocardiogram limited has been performed.  Darlina Sicilian M 09/01/2022, 11:41 AM

## 2022-09-01 NOTE — Progress Notes (Signed)
ANTICOAGULATION CONSULT NOTE - Initial Consult  Pharmacy Consult for Heparin >> apixaban  Indication:  rule out PE  Allergies  Allergen Reactions   Baclofen Nausea And Vomiting   Penicillin G     Other reaction(s): Unknown   Penicillins Rash    Patient Measurements: Height: '5\' 3"'$  (160 cm) Weight: 49.1 kg (108 lb 3.9 oz) IBW/kg (Calculated) : 52.4 Heparin Dosing Weight: 45.5 kg  Vital Signs: Temp: 98.3 F (36.8 C) (01/23 0722) Temp Source: Oral (01/23 0722) BP: 114/94 (01/23 0722) Pulse Rate: 90 (01/23 0722)  Labs: Recent Labs    08/30/22 1409 08/30/22 1610 08/30/22 2016 08/30/22 2123 08/31/22 0715 09/01/22 0125  HGB 7.3*  --   --   --  7.0*  --   HCT 23.7*  --   --   --  23.0*  --   PLT 275  --   --   --  281  --   APTT  --   --   --  30 88* 100*  HEPARINUNFRC  --   --   --  >1.10* >1.10* 1.09*  CREATININE 2.89*  --   --   --  2.84*  --   TROPONINIHS  --  51* 52*  --   --   --     Estimated Creatinine Clearance: 12.9 mL/min (A) (by C-G formula based on SCr of 2.84 mg/dL (H)).   Medical History: Past Medical History:  Diagnosis Date   Acid reflux 07/15/2020   Arthritis 07/15/2020   CHF (congestive heart failure) (HCC)    Colitis 07/15/2020   Dilated cardiomyopathy ([place]) 07/15/2020   HFrEF (heart failure with reduced ejection fraction) ([place]) 07/15/2020   HTN (hypertension) 07/15/2020   Osteoporosis 07/15/2020   Palpitations 07/15/2020   Rheumatoid arthritis ([place]) 07/15/2020      Assessment: 77 yo W with elevated d-dimer and suspected PE. Patient is on apixaban prior to admission for afib. Last dose 1/20pm, per patient. Heparin per pharmacy consult placed for  rule out PE .   Heparin level 1.09 is still affected by apixaban. APTT  100  is therapeutic at the upper end on 800 units/hr. CBC stable. VQ scan with no perfusion defect to suggest pulmonary emboli. Per MD, ok to switch back to apixaban.   Goal of Therapy:  Heparin level 0.3-0.7 units/ml aPTT 66-102  seconds Monitor platelets by anticoagulation protocol: Yes   Plan:  Stop  Heparin  Restart apixaban 2.5 BID  Monitor for signs/symptoms of bleeding    Benetta Spar, PharmD, BCPS, BCCP Clinical Pharmacist  Please check AMION for all [place] phone numbers After 10:00 PM, call [place] [phone]

## 2022-09-01 NOTE — Progress Notes (Addendum)
Advanced Heart Failure Rounding Note  PCP-Cardiologist: None   Subjective:    Cr 2.84>3.23. Minimal UOP yesterday with 80 IV lasix. Up 3lbs?  Feels ok this morning. Denies CP/SOB.    Objective:   Weight Range: 49.1 kg Body mass index is 19.17 kg/m.   Vital Signs:   Temp:  [98.3 F (36.8 C)-98.6 F (37 C)] 98.3 F (36.8 C) (01/23 0722) Pulse Rate:  [90-96] 90 (01/23 0722) Resp:  [17-20] 17 (01/23 0722) BP: (105-114)/(83-95) 114/94 (01/23 0722) SpO2:  [96 %-99 %] 98 % (01/23 0722) Weight:  [49.1 kg] 49.1 kg (01/23 0402) Last BM Date : 08/31/22  Weight change: Filed Weights   08/31/22 0130 09/01/22 0402  Weight: 48 kg 49.1 kg    Intake/Output:   Intake/Output Summary (Last 24 hours) at 09/01/2022 0829 Last data filed at 09/01/2022 0700 Gross per 24 hour  Intake 971.74 ml  Output 300 ml  Net 671.74 ml      Physical Exam    General:  chronically ill appearing.  No respiratory difficulty HEENT: normal Neck: supple. JVD ~10 cm. Carotids 2+ bilat; no bruits. No lymphadenopathy or thyromegaly appreciated. Cor: PMI nondisplaced. Regular rate & rhythm. No rubs, gallops or murmurs. Lungs: clear Abdomen: soft, nontender, nondistended. No hepatosplenomegaly. No bruits or masses. Good bowel sounds. Extremities: no cyanosis, clubbing, rash, edema  Neuro: alert & oriented x 3, cranial nerves grossly intact. moves all 4 extremities w/o difficulty. Affect pleasant.   Telemetry   NSR 90s 2-7 PVCs/hr (Personally reviewed)    EKG    No new EKG to review  Labs    CBC Recent Labs    08/30/22 1409 08/31/22 0715 09/01/22 0727  WBC 9.2 8.7 7.7  NEUTROABS 6.9  --   --   HGB 7.3* 7.0* 9.0*  HCT 23.7* 23.0* 29.3*  MCV 101.3* 100.4* 94.5  PLT 275 281 989   Basic Metabolic Panel Recent Labs    08/30/22 1409 08/31/22 0715  NA 140 141  K 4.0 3.8  CL 111 111  CO2 16* 18*  GLUCOSE 101* 105*  BUN 46* 46*  CREATININE 2.89* 2.84*  CALCIUM 8.8* 8.8*  MG  --   2.1   Liver Function Tests Recent Labs    08/30/22 1409  AST 25  ALT 18  ALKPHOS 73  BILITOT 0.4  PROT 6.3*  ALBUMIN 3.1*   No results for input(s): "LIPASE", "AMYLASE" in the last 72 hours. Cardiac Enzymes No results for input(s): "CKTOTAL", "CKMB", "CKMBINDEX", "TROPONINI" in the last 72 hours.  BNP: BNP (last 3 results) Recent Labs    07/07/22 1515 08/27/22 1658 08/30/22 1409  BNP 232.0* >5000.0* >4,500.0*    ProBNP (last 3 results) No results for input(s): "PROBNP" in the last 8760 hours.   D-Dimer Recent Labs    08/30/22 1450  DDIMER 1.95*   Hemoglobin A1C No results for input(s): "HGBA1C" in the last 72 hours. Fasting Lipid Panel No results for input(s): "CHOL", "HDL", "LDLCALC", "TRIG", "CHOLHDL", "LDLDIRECT" in the last 72 hours. Thyroid Function Tests No results for input(s): "TSH", "T4TOTAL", "T3FREE", "THYROIDAB" in the last 72 hours.  Invalid input(s): "FREET3"  Other results:   Imaging    NM Pulmonary Perfusion  Result Date: 08/31/2022 CLINICAL DATA:  Pulmonary embolism suspected. Low to intermediate clinical probability. Positive D-dimer. Cough. EXAM: NUCLEAR MEDICINE PERFUSION LUNG SCAN TECHNIQUE: Perfusion images were obtained in multiple projections after intravenous injection of radiopharmaceutical. Ventilation scans intentionally deferred if perfusion scan and chest x-ray  adequate for interpretation during COVID 19 epidemic. RADIOPHARMACEUTICALS:  4.0 mCi Tc-66mMAA IV COMPARISON:  Chest radiography 08/30/2022 FINDINGS: No perfusion defect to suggest pulmonary embolism. Enlarged heart shadow. Minimal pleural blunting consistent with the small pleural effusions. IMPRESSION: No perfusion defect to suggest pulmonary emboli. Cardiomegaly. Small pleural effusions. Electronically Signed   By: MNelson ChimesM.D.   On: 08/31/2022 10:36     Medications:     Scheduled Medications:  amiodarone  200 mg Oral Daily   apixaban  2.5 mg Oral BID    azaTHIOprine  100 mg Oral Daily   folic acid  1 mg Oral Daily   hydrocerin   Topical Daily   lidocaine  1 patch Transdermal Q24H   midodrine  5 mg Oral TID WC   pantoprazole  80 mg Oral Daily   predniSONE  5 mg Oral Q breakfast   sodium bicarbonate  650 mg Oral Daily   sodium chloride flush  3 mL Intravenous Q12H   Vericiguat  10 mg Oral Daily    Infusions:   PRN Medications: acetaminophen **OR** acetaminophen, alum & mag hydroxide-simeth, ondansetron **OR** ondansetron (ZOFRAN) IV, senna-docusate    Patient Profile   Tracy Nelson a 77y.o. AAF with history of rheumatoid arthritis, atrial fibrillation, CKD stage 3, post-herpetic neuralgia (shingles L upper back 3/23) and nonischemic cardiomyopathy. 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    Assessment/Plan   1. 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   - Echo 07/2022 EF improved 45-50%. RV normal  - RHC 12/21 low filling pressures and relatively preserved cardiac index at 2.22. - NYHA class IIIb/IV on admission - Volume status difficult to access with swollen lymph nodes/facial swelling. BNP >4,500 though JVD does not appear too elevated. Up 5 lbs since 08/04/22.  - Got 80 IV lasix nce yesterday. Minimal UOP documented. Up 3lbs? PICC vs RHC. Will discuss with MD. Hold further diuretics for now. May need inotropic support.  - Continue midodrine 5 mg tid. BP stable - Continue Verquvo 10 mg daily.  - Off dapagliflozin with rise in creatinine, BP appears more stable, will consider adding once renal function stable.  - Off spironolactone with elevated creatinine and soft BP.  - Does not have an ICD,and Tracy Nelson is out of range with EF 45-50%. - check echo - Strict I&O, daily weights   2. Elevated troponin - LHC 15': No CAD - HsTrop 51>52. EKG stable. - Denies CP, suspect demand ischemia.    2. CKD: Stage 4.   - Tracy Nelson is followed by Dr. FRoyce Macadamia -  Baseline appears to be around 2.4-2.6 - 2.84>3.23 today, diuretics on hold for now - avoid hypotension   3. Rheumatoid arthritis: No history of lung involvement. On low dose of prednisone chronically.    4. Atrial fibrillation/flutter: S/p TEE-DCCV in 4/22.  Tracy Nelson is now on amiodarone and in NSR.  - Continue amiodarone 200 mg daily. LFTs and TSH normal (11/23). - Eliquis restarted today    5. Shingles: Resolved.    6. Left Hip Fracture - S/P IM Nail 07/2022 - will consult PT   7. Swollen lymph nodes - primarily L side - white patches seen in back of throat - discussed with primary - strep (-)   8. Microcytic anemia - 2/2 plaquenil? - per primary - Hgb 7 on admission. 9 today s/p 1uPRBc yesterday  ADDENDUM 09/01/22 2:38 PM  Reviewed echo: IVC dilated. EF now <20%.  Will place PICC and start milrinone .25. Can start diuresis once milrinone started.    Length of Stay: [place], NP  09/01/2022, 8:29 AM  Advanced Heart Failure Team Pager [phone] (M-F; 7a - 5p)  Please contact [facility] for night-coverage after hours (5p -7a ) and weekends on [email or web address]

## 2022-09-01 NOTE — Plan of Care (Signed)

## 2022-09-01 NOTE — Evaluation (Signed)
Physical Therapy Evaluation Patient Details Name: Tracy Nelson MRN: [id number] DOB: 04-03-1946 Today's Date: 09/01/2022  History of Present Illness  Patient is a 77 y/o female who presents on 1/21 with cough, SOB, weakness. CXR- small bilateral pleural effusions. Admitted with acute on chronic HF with possible PE started on Heparin drip. PMH includes HF, nonischemic cardiomyopathy, RA, CKD IV, recent IM nail hip 07/10/22, HTN.   Clinical Impression  Patient sleeping on arrival, she is agreeable to PT assessment. Patient is mod I with bed mobility and transfer sit to stand. She ambulated 20 feet with RW and supervision/cga. Patient limited by decreased activity tolerance and fatigue. She will continue to benefit from skilled PT to improve strength and tolerance for safe return home.        Recommendations for follow up therapy are one component of a multi-disciplinary discharge planning process, led by the attending physician.  Recommendations may be updated based on patient status, additional functional criteria and insurance authorization.  Follow Up Recommendations Home health PT      Assistance Recommended at Discharge Frequent or constant Supervision/Assistance  Patient can return home with the following  A little help with walking and/or transfers;A little help with bathing/dressing/bathroom;Assistance with cooking/housework;Assist for transportation;Help with stairs or ramp for entrance    Equipment Recommendations None recommended by PT  Recommendations for Other Services       Functional Status Assessment Patient has had a recent decline in their functional status and demonstrates the ability to make significant improvements in function in a reasonable and predictable amount of time.     Precautions / Restrictions Precautions Precautions: Fall Restrictions Weight Bearing Restrictions: No      Mobility  Bed Mobility Overal bed mobility: Modified Independent                   Transfers Overall transfer level: Modified independent Equipment used: Rolling walker (2 wheels)                    Ambulation/Gait Ambulation/Gait assistance: Supervision Gait Distance (Feet): 20 Feet Assistive device: Rolling walker (2 wheels) Gait Pattern/deviations: Step-to pattern, Decreased step length - right, Decreased step length - left, Decreased stride length Gait velocity: decr     General Gait Details: decreased activity tolerance, wanted to turn back after 10 feet with RW.  Stairs            Wheelchair Mobility    Modified Rankin (Stroke Patients Only)       Balance Overall balance assessment: Needs assistance Sitting-balance support: Feet supported Sitting balance-Leahy Scale: Good     Standing balance support: Bilateral upper extremity supported, During functional activity, Reliant on assistive device for balance Standing balance-Leahy Scale: Fair Standing balance comment: benefits from B UE support                             Pertinent Vitals/Pain Pain Assessment Pain Assessment: Faces Faces Pain Scale: Hurts a little bit Pain Location: hip, R heel Pain Descriptors / Indicators: Discomfort, Sore Pain Intervention(s): Monitored during session, Repositioned    Home Living Family/patient expects to be discharged to:: Private residence Living Arrangements: Children Available Help at Discharge: Family;Available PRN/intermittently Type of Home: House Home Access: Level entry       Home Layout: Two level;Able to live on main level with bedroom/bathroom Home Equipment: [place] (2 wheels) Additional Comments: Lives with daughter and her family - she is  typically alone during the day.    Prior Function Prior Level of Function : Independent/Modified Independent             Mobility Comments: recently discharged from SNF after hip fracture, using RW ADLs Comments: mod I     Hand Dominance    Dominant Hand: Right    Extremity/Trunk Assessment   Upper Extremity Assessment Upper Extremity Assessment: Generalized weakness    Lower Extremity Assessment Lower Extremity Assessment: Generalized weakness    Cervical / Trunk Assessment Cervical / Trunk Assessment: Normal  Communication   Communication: No difficulties  Cognition Arousal/Alertness: Awake/alert Behavior During Therapy: Flat affect Overall Cognitive Status: Within Functional Limits for tasks assessed                                          General Comments      Exercises     Assessment/Plan    PT Assessment Patient needs continued PT services  PT Problem List Decreased strength;Decreased activity tolerance;Decreased balance;Decreased mobility       PT Treatment Interventions DME instruction;Therapeutic exercise;Gait training;Stair training;Functional mobility training;Therapeutic activities;Patient/family education;Balance training    PT Goals (Current goals can be found in the Care Plan section)  Acute Rehab PT Goals Patient Stated Goal: to return home PT Goal Formulation: With patient Time For Goal Achievement: 09/08/22 Potential to Achieve Goals: Good    Frequency Min 3X/week     Co-evaluation               AM-PAC PT "6 Clicks" Mobility  Outcome Measure Help needed turning from your back to your side while in a flat bed without using bedrails?: None Help needed moving from lying on your back to sitting on the side of a flat bed without using bedrails?: None Help needed moving to and from a bed to a chair (including a wheelchair)?: A Little Help needed standing up from a chair using your arms (e.g., wheelchair or bedside chair)?: A Little Help needed to walk in hospital room?: A Little Help needed climbing 3-5 steps with a railing? : A Lot 6 Click Score: 19    End of Session Equipment Utilized During Treatment: Gait belt Activity Tolerance: Patient limited by  fatigue Patient left: in bed;with call bell/phone within reach Nurse Communication: Mobility status PT Visit Diagnosis: Muscle weakness (generalized) (M62.81);Difficulty in walking, not elsewhere classified (R26.2)    Time: 6314-9702 PT Time Calculation (min) (ACUTE ONLY): 12 min   Charges:   PT Evaluation $PT Eval Moderate Complexity: 1 Mod          Julie-Ann Vanmaanen, PT, GCS 09/01/22,9:51 AM

## 2022-09-01 NOTE — Progress Notes (Signed)
Peripherally Inserted Central Catheter Placement  The IV Nurse has discussed with the patient and/or persons authorized to consent for the patient, the purpose of this procedure and the potential benefits and risks involved with this procedure.  The benefits include less needle sticks, lab draws from the catheter, and the patient may be discharged home with the catheter. Risks include, but not limited to, infection, bleeding, blood clot (thrombus formation), and puncture of an artery; nerve damage and irregular heartbeat and possibility to perform a PICC exchange if needed/ordered by physician.  Alternatives to this procedure were also discussed.  Bard Power PICC patient education guide, fact sheet on infection prevention and patient information card has been provided to patient /or left at bedside.    PICC Placement Documentation  PICC Double Lumen 09/01/22 Right Brachial 34 cm 0 cm (Active)  Indication for Insertion or Continuance of Line Vasoactive infusions 09/01/22 1649  Exposed Catheter (cm) 0 cm 09/01/22 1649  Site Assessment Clean, Dry, Intact 09/01/22 1649  Lumen #1 Status Flushed;Saline locked;Blood return noted 09/01/22 1649  Lumen #2 Status Flushed;Saline locked;Blood return noted 09/01/22 1649  Dressing Type Securing device;Transparent 09/01/22 1649  Dressing Status Antimicrobial disc in place 09/01/22 1649  Dressing Intervention New dressing;Other (Comment) 09/01/22 1649  Dressing Change Due 09/08/22 09/01/22 1649   Patient signed consent. Procedure risk and benefits explained to daughter as well.    Christella Noa Albarece 09/01/2022, 4:50 PM

## 2022-09-01 NOTE — Progress Notes (Signed)
PROGRESS NOTE    Tracy Nelson  XHB:[id number] DOB: [birth year] DOA: 08/30/2022 PCP: [name], [name]  77/F, chronically ill with chronic systolic CHF, NICM, EF 81-01% recently improved to 45%, CKD 4, paroxysmal A-fib/A-flutter, anemia of chronic disease, rheumatoid arthritis on Plaquenil and azathioprine, left hip fracture treated with IM nail 07/10/2022, postop went to rehab and was discharged home 5 days ago. -Presented to the ED with progressive dyspnea on exertion, orthopnea and cough. -Compliant with Bumex daily, took an extra dose day prior to admission -In the ED vital stable, hemoglobin 7.3, creatinine 2.9, bicarb 16, BNP> 4500, D-dimer 1.95, troponin 51 -Chest x-ray with small bilateral pleural effusions -Admitted, started on IV Lasix, IV heparin, VQ scan negative -1/22: Transfused PRBC, Lasix X1  Subjective: -Still with cough, did not urinate much yesterday  Assessment and Plan:  Acute on chronic HFrEF -Known NICM, previously EF 20-25%, last echo 12/23 with improvement to 75-10%, grade 1 diastolic dysfunction, normal RV -Followed by Dr. Aundra Dubin with advanced heart failure team, concern of end-stage CHF on recent eval, also complicated by CKD 4 and hypotension limiting GDMT -Volume status difficult to assess, weight trending up, creat up to 3.2 may need right heart cath, ? 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 Royce Macadamia.   Elevated d-dimer -Likely nonspecific in the setting of long history of RA, VQ scan negative, heparin discontinued, Eliquis resumed  Paroxysmal atrial fibrillation (HCC) -Sinus rhythm at this time, continue amiodarone and IV heparin as above.  Rheumatoid arthritis involving multiple sites with positive rheumatoid factor (HCC) On chronic immunosuppressants.  Continue prednisone, Imuran. -No indication for stress dose steroids at this time -Reviewed rheumatology notes, Dr. Benjamine Mola 2/23 and discontinued Plaquenil, patient unfortunately patient continue to take this for 11 months after DC  DVT prophylaxis: apixaban Code Status: Full code Family Communication: None present Disposition Plan: Home pending improvement in kidney function  Consultants: CHF team   Procedures:   Antimicrobials:    Objective: Vitals:   08/31/22 2304 09/01/22 0402 09/01/22 0722 09/01/22 1012  BP:  (!) 113/90 (!) 114/94 (!) 120/101  Pulse:  92 90 89  Resp:  '20 17 20  '$ Temp: 98.6 F (37 C) 98.6 F (37 C) 98.3 F (36.8 C) 98.3 F (36.8 C)  TempSrc: Oral Oral Oral Oral  SpO2:  96% 98% 99%  Weight:  49.1 kg    Height:        Intake/Output Summary (Last 24 hours) at 09/01/2022 1127 Last data filed at 09/01/2022 0854 Gross per 24 hour  Intake 1031.74 ml  Output 300 ml  Net 731.74 ml   Filed Weights   08/31/22 0130 09/01/22 0402  Weight: 48 kg 49.1 kg    Examination:  General exam: Elderly chronically ill female sitting  up in bed, AAOx3, cushingoid facies HEENT: Mildly elevated JVD, mildly enlarged left periauricular lymph node CVS: S1-S2, regular rhythm Lungs: Clear bilaterally Abdomen: Soft, nontender, bowel sounds present Extremities: No edema  Skin: No  rashes Psychiatry: Flat affect    Data Reviewed:   CBC: Recent Labs  Lab 08/27/22 1658 08/30/22 1409 08/31/22 0715 09/01/22 0727  WBC 7.0 9.2 8.7 7.7  NEUTROABS  --  6.9  --   --   HGB 7.3* 7.3* 7.0* 9.0*  HCT 22.2* 23.7* 23.0* 29.3*  MCV 91 101.3* 100.4* 94.5  PLT 286 275 281 654   Basic Metabolic Panel: Recent Labs  Lab 08/27/22 1658 08/30/22 1409 08/31/22 0715 09/01/22 0727  NA 143 140 141 138  K 4.0 4.0 3.8 4.1  CL 108* 111 111 109  CO2 17* 16* 18* 16*  GLUCOSE 123* 101* 105* 133*  BUN 37* 46* 46* 54*  CREATININE 2.46* 2.89* 2.84* 3.23*  CALCIUM 9.1 8.8* 8.8* 8.7*  MG  --   --  2.1  --    GFR: Estimated Creatinine Clearance: 11.3 mL/min (A) (by C-G formula based on SCr of 3.23 mg/dL (H)). Liver Function Tests: Recent Labs  Lab 08/30/22 1409  AST 25  ALT 18  ALKPHOS 73  BILITOT 0.4  PROT 6.3*  ALBUMIN 3.1*   No results for input(s): "LIPASE", "AMYLASE" in the last 168 hours. No results for input(s): "AMMONIA" in the last 168 hours. Coagulation Profile: No results for input(s): "INR", "PROTIME" in the last 168 hours. Cardiac Enzymes: No results for input(s): "CKTOTAL", "CKMB", "CKMBINDEX", "TROPONINI" in the last 168 hours. BNP (last 3 results) No results for input(s): "PROBNP" in the last 8760 hours. HbA1C: No results for input(s): "HGBA1C" in the last 72 hours. CBG: No results for input(s): "GLUCAP" in the last 168 hours. Lipid Profile: No results for input(s): "CHOL", "HDL", "LDLCALC", "TRIG", "CHOLHDL", "LDLDIRECT" in the last 72 hours. Thyroid Function Tests: No results for input(s): "TSH", "T4TOTAL", "FREET4", "T3FREE", "THYROIDAB" in the last 72 hours. Anemia Panel: Recent Labs    08/31/22 1102  VITAMINB12 556  FOLATE >40.0  FERRITIN 345*  TIBC 304  IRON 38  RETICCTPCT 4.7*   Urine analysis:    Component Value Date/Time   COLORURINE YELLOW 07/13/2022 0945   APPEARANCEUR CLEAR 07/13/2022 0945   LABSPEC 1.012 07/13/2022 0945    PHURINE 5.0 07/13/2022 0945   GLUCOSEU NEGATIVE 07/13/2022 0945   HGBUR NEGATIVE 07/13/2022 0945   BILIRUBINUR NEGATIVE 07/13/2022 0945   BILIRUBINUR negative 06/04/2021 1151   KETONESUR NEGATIVE 07/13/2022 0945   PROTEINUR NEGATIVE 07/13/2022 0945   UROBILINOGEN 0.2 06/04/2021 1151   NITRITE NEGATIVE 07/13/2022 0945   LEUKOCYTESUR NEGATIVE 07/13/2022 0945   Sepsis Labs: '@LABRCNTIP'$ (procalcitonin:4,lacticidven:4)  ) Recent Results (from the past 240 hour(s))  Resp panel by RT-PCR (RSV, Flu A&B, Covid) Anterior Nasal Swab     Status: None   Collection Time: 08/30/22  3:34 PM   Specimen: Anterior Nasal Swab  Result Value Ref Range Status   SARS Coronavirus 2 by RT PCR NEGATIVE NEGATIVE Final    Comment: (NOTE) SARS-CoV-2 target nucleic acids are NOT DETECTED.  The SARS-CoV-2 RNA is generally detectable in upper respiratory specimens during the acute phase of infection. The lowest concentration of SARS-CoV-2 viral copies this assay can detect is 138 copies/mL. A negative result does not preclude SARS-Cov-2 infection and should not be used as the sole basis for treatment or other patient management decisions. A negative result may occur with  improper  specimen collection/handling, submission of specimen other than nasopharyngeal swab, presence of viral mutation(s) within the areas targeted by this assay, and inadequate number of viral copies(<138 copies/mL). A negative result must be combined with clinical observations, patient history, and epidemiological information. The expected result is Negative.  Fact Sheet for Patients:  EntrepreneurPulse.com.au  Fact Sheet for Healthcare Providers:  IncredibleEmployment.be  This test is no t yet approved or cleared by the [place] FDA and  has been authorized for detection and/or diagnosis of SARS-CoV-2 by FDA under an Emergency Use Authorization (EUA). This EUA will remain  in effect (meaning  this test can be used) for the duration of the COVID-19 declaration under Section 564(b)(1) of the Act, 21 U.S.C.section 360bbb-3(b)(1), unless the authorization is terminated  or revoked sooner.       Influenza A by PCR NEGATIVE NEGATIVE Final   Influenza B by PCR NEGATIVE NEGATIVE Final    Comment: (NOTE) The Xpert Xpress SARS-CoV-2/FLU/RSV plus assay is intended as an aid in the diagnosis of influenza from Nasopharyngeal swab specimens and should not be used as a sole basis for treatment. Nasal washings and aspirates are unacceptable for Xpert Xpress SARS-CoV-2/FLU/RSV testing.  Fact Sheet for Patients: EntrepreneurPulse.com.au  Fact Sheet for Healthcare Providers: IncredibleEmployment.be  This test is not yet approved or cleared by the [place] FDA and has been authorized for detection and/or diagnosis of SARS-CoV-2 by FDA under an Emergency Use Authorization (EUA). This EUA will remain in effect (meaning this test can be used) for the duration of the COVID-19 declaration under Section 564(b)(1) of the Act, 21 U.S.C. section 360bbb-3(b)(1), unless the authorization is terminated or revoked.     Resp Syncytial Virus by PCR NEGATIVE NEGATIVE Final    Comment: (NOTE) Fact Sheet for Patients: EntrepreneurPulse.com.au  Fact Sheet for Healthcare Providers: IncredibleEmployment.be  This test is not yet approved or cleared by the [place] FDA and has been authorized for detection and/or diagnosis of SARS-CoV-2 by FDA under an Emergency Use Authorization (EUA). This EUA will remain in effect (meaning this test can be used) for the duration of the COVID-19 declaration under Section 564(b)(1) of the Act, 21 U.S.C. section 360bbb-3(b)(1), unless the authorization is terminated or revoked.  Performed at [facility], [place] [address], [place], [place] [address]   Group A Strep by PCR      Status: None   Collection Time: 08/31/22  5:12 PM   Specimen: Throat; Sterile Swab  Result Value Ref Range Status   Group A Strep by PCR NOT DETECTED NOT DETECTED Final    Comment: Performed at [facility], [place] [address], [place], [place] [address]     Radiology Studies: NM Pulmonary Perfusion  Result Date: 08/31/2022 CLINICAL DATA:  Pulmonary embolism suspected. Low to intermediate clinical probability. Positive D-dimer. Cough. EXAM: NUCLEAR MEDICINE PERFUSION LUNG SCAN TECHNIQUE: Perfusion images were obtained in multiple projections after intravenous injection of radiopharmaceutical. Ventilation scans intentionally deferred if perfusion scan and chest x-ray adequate for interpretation during COVID 19 epidemic. RADIOPHARMACEUTICALS:  4.0 mCi Tc-23mMAA IV COMPARISON:  Chest radiography 08/30/2022 FINDINGS: No perfusion defect to suggest pulmonary embolism. Enlarged heart shadow. Minimal pleural blunting consistent with the small pleural effusions. IMPRESSION: No perfusion defect to suggest pulmonary emboli. Cardiomegaly. Small pleural effusions. Electronically Signed   By: MNelson ChimesM.D.   On: 08/31/2022 10:36   DG Chest 2 View  Result Date: 08/30/2022 CLINICAL DATA:  Shortness of breath EXAM: CHEST - 2 VIEW  COMPARISON:  Chest x-ray 07/08/2022 FINDINGS: The heart is enlarged, unchanged. There are small bilateral pleural effusions. There are minimal bibasilar patchy opacities, left greater than right. There is no evidence for pneumothorax. No acute fractures are seen. IMPRESSION: 1. Small bilateral pleural effusions. 2. Minimal bibasilar patchy opacities, left greater than right, may represent atelectasis or infection. Electronically Signed   By: Ronney Asters M.D.   On: 08/30/2022 15:19     Scheduled Meds:  amiodarone  200 mg Oral Daily   apixaban  2.5 mg Oral BID   azaTHIOprine  100 mg Oral Daily   folic acid  1 mg Oral Daily   hydrocerin   Topical Daily   lidocaine  1 patch  Transdermal Q24H   midodrine  5 mg Oral TID WC   pantoprazole  80 mg Oral Daily   predniSONE  5 mg Oral Q breakfast   sodium bicarbonate  650 mg Oral Daily   sodium chloride flush  3 mL Intravenous Q12H   Vericiguat  10 mg Oral Daily   Continuous Infusions:     LOS: 2 days    Time spent: 69mn    PDomenic Polite MD [facility]   09/01/2022, 11:27 AM

## 2022-09-02 DIAGNOSIS — I480 Paroxysmal atrial fibrillation: Secondary | ICD-10-CM | POA: Diagnosis not present

## 2022-09-02 DIAGNOSIS — N184 Chronic kidney disease, stage 4 (severe): Secondary | ICD-10-CM | POA: Diagnosis not present

## 2022-09-02 DIAGNOSIS — D638 Anemia in other chronic diseases classified elsewhere: Secondary | ICD-10-CM | POA: Diagnosis not present

## 2022-09-02 DIAGNOSIS — M0579 Rheumatoid arthritis with rheumatoid factor of multiple sites without organ or systems involvement: Secondary | ICD-10-CM

## 2022-09-02 DIAGNOSIS — I5023 Acute on chronic systolic (congestive) heart failure: Secondary | ICD-10-CM | POA: Diagnosis not present

## 2022-09-02 LAB — BASIC METABOLIC PANEL
Anion gap: 11 (ref 5–15)
BUN: 58 mg/dL — ABNORMAL HIGH (ref 8–23)
CO2: 20 mmol/L — ABNORMAL LOW (ref 22–32)
Calcium: 8.7 mg/dL — ABNORMAL LOW (ref 8.9–10.3)
Chloride: 108 mmol/L (ref 98–111)
Creatinine, Ser: 3.33 mg/dL — ABNORMAL HIGH (ref 0.44–1.00)
GFR, Estimated: 14 mL/min — ABNORMAL LOW (ref >60)
Glucose, Bld: 151 mg/dL — ABNORMAL HIGH (ref 70–99)
Potassium: 3.7 mmol/L (ref 3.5–5.1)
Sodium: 139 mmol/L (ref 135–145)

## 2022-09-02 LAB — CBC
HCT: 26 % — ABNORMAL LOW (ref 36.0–46.0)
Hemoglobin: 8.1 g/dL — ABNORMAL LOW (ref 12.0–15.0)
MCH: 29.3 pg (ref 26.0–34.0)
MCHC: 31.2 g/dL (ref 30.0–36.0)
MCV: 94.2 fL (ref 80.0–100.0)
Platelets: 224 10*3/uL (ref 150–400)
RBC: 2.76 MIL/uL — ABNORMAL LOW (ref 3.87–5.11)
RDW: 24.6 % — ABNORMAL HIGH (ref 11.5–15.5)
WBC: 6.7 10*3/uL (ref 4.0–10.5)
nRBC: 3.3 % — ABNORMAL HIGH (ref 0.0–0.2)

## 2022-09-02 LAB — COOXEMETRY PANEL
Carboxyhemoglobin: 1 % (ref 0.5–1.5)
Methemoglobin: <0.7 % (ref 0.0–1.5)
O2 Saturation: 57.9 %
Total hemoglobin: 8.4 g/dL — ABNORMAL LOW (ref 12.0–16.0)

## 2022-09-02 LAB — TSH: TSH: 0.881 u[IU]/mL (ref 0.350–4.500)

## 2022-09-02 MED ORDER — SODIUM CHLORIDE 0.9 % IV SOLN: 250.0000 mL | INTRAVENOUS | Status: DC | PRN

## 2022-09-02 MED ORDER — SODIUM CHLORIDE 0.9% FLUSH
3.0000 mL | Freq: Two times a day (BID) | INTRAVENOUS | Status: DC
  Administered 2022-09-04: 3 mL via INTRAVENOUS

## 2022-09-02 MED ORDER — AMIODARONE HCL 200 MG PO TABS
400.0000 mg | ORAL_TABLET | Freq: Two times a day (BID) | ORAL | Status: DC
  Administered 2022-09-02 – 2022-09-07 (×10): 400 mg via ORAL
  Filled 2022-09-02 (×10): qty 2

## 2022-09-02 MED ORDER — POTASSIUM CHLORIDE CRYS ER 20 MEQ PO TBCR
20.0000 meq | EXTENDED_RELEASE_TABLET | Freq: Once | ORAL | Status: AC
  Administered 2022-09-02: 20 meq via ORAL
  Filled 2022-09-02: qty 1

## 2022-09-02 MED ORDER — GUAIFENESIN-DM 100-10 MG/5ML PO SYRP
5.0000 mL | ORAL_SOLUTION | ORAL | Status: DC | PRN
  Administered 2022-09-02 – 2022-09-04 (×7): 5 mL via ORAL
  Filled 2022-09-02 (×8): qty 5

## 2022-09-02 MED ORDER — AMIODARONE HCL 200 MG PO TABS
200.0000 mg | ORAL_TABLET | ORAL | Status: AC
  Administered 2022-09-02: 200 mg via ORAL
  Filled 2022-09-02: qty 1

## 2022-09-02 MED ORDER — SODIUM CHLORIDE 0.9 % IV SOLN: INTRAVENOUS | Status: DC

## 2022-09-02 MED ORDER — METOLAZONE 2.5 MG PO TABS
2.5000 mg | ORAL_TABLET | Freq: Once | ORAL | Status: AC
  Administered 2022-09-02: 2.5 mg via ORAL
  Filled 2022-09-02: qty 1

## 2022-09-02 MED ORDER — SODIUM CHLORIDE 0.9% FLUSH: 3.0000 mL | INTRAVENOUS | Status: DC | PRN

## 2022-09-02 MED ORDER — ASPIRIN 81 MG PO CHEW
81.0000 mg | CHEWABLE_TABLET | ORAL | Status: AC
  Administered 2022-09-03: 81 mg via ORAL
  Filled 2022-09-02: qty 1

## 2022-09-02 NOTE — Assessment & Plan Note (Addendum)
Echocardiogram with reduced LV systolic function <49%, with global hypokinesis, severe reduction in RV systolic function, moderate MR, moderate to severe TR. Mild to moderate AI, Moderate pulmonic valve regurgitation. 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   01/25 right heart catheterization.  PA 48/14 with mean 29 PCWP 17  PVR 2.8 wu Cardiac output is 4.2 and index 2,8 (fick).   Pre and post capillary pulmonary hypertension.    Urine output is documented at 400 cc. Systolic blood pressure 826 to 89 mmHg.  SV02 58.9  Continue with IV milrinone (may need palliative home inotropic support vs hospice).  Holding torsemide for now.  Patient with advance disease and poor prognosis.

## 2022-09-02 NOTE — Progress Notes (Signed)
Dr. Cathlean Sauer notified via secure chat that PT's CVP has increased to 16 from 10 and that IV Lasix and Zaroxolyn were given at the time the CVP reading was obtained. Per Dr. Cathlean Sauer, will continue to monitor.

## 2022-09-02 NOTE — Progress Notes (Signed)
   09/02/22 1500  Mobility  Activity Ambulated with assistance in hallway  Level of Assistance Contact guard assist, steadying assist  Assistive Device Front wheel walker  Distance Ambulated (ft) 40 ft  LLE Weight Bearing WBAT  Activity Response Tolerated well  Mobility Referral Yes  $Mobility charge 1 Mobility   Mobility Specialist Progress Note  Pt in bed and agreeable. Had c/o hip pain before ambulation. Ambulation cut short d/t fatigue. Returned to chair w/ all needs met and call bell in reach.   Lucious Groves Mobility Specialist  Please contact via SecureChat or Rehab office at [phone]

## 2022-09-02 NOTE — Assessment & Plan Note (Addendum)
Chronic metabolic acidosis  Renal function today with serum cr at 3.1 with K at 4,1 and serum bicarbonate at 22.   Continue with milrinone and torsemide. Follow up renal function and electrolytes in am.

## 2022-09-02 NOTE — Progress Notes (Signed)
Advanced Heart Failure Rounding Note  PCP-Cardiologist: None   Subjective:   - Hypervolemic on exam; JVP elevated.  Objective:   Weight Range: 49.8 kg Body mass index is 19.45 kg/m.   Vital Signs:   Temp:  [97.8 F (36.6 C)-98.6 F (37 C)] 97.8 F (36.6 C) (01/24 0811) Pulse Rate:  [93-98] 93 (01/24 0811) Resp:  [16-20] 19 (01/24 0811) BP: (109-122)/(71-85) 109/71 (01/24 0811) SpO2:  [95 %-100 %] 100 % (01/24 0811) Weight:  [49.8 kg] 49.8 kg (01/24 0308) Last BM Date : 08/31/22  Weight change: Filed Weights   08/31/22 0130 09/01/22 0402 09/02/22 0308  Weight: 48 kg 49.1 kg 49.8 kg    Intake/Output:   Intake/Output Summary (Last 24 hours) at 09/02/2022 1559 Last data filed at 09/02/2022 1251 Gross per 24 hour  Intake 269.99 ml  Output 1050 ml  Net -780.01 ml       Physical Exam    General:  chronically ill appearing.  No respiratory difficulty HEENT: normal Neck: supple. JVD 12cm . Left submandibular lymphadenopathy Cor: PMI nondisplaced. Regular rate & rhythm. No rubs, gallops or murmurs. Lungs: clear Abdomen: soft, nontender, nondistended. No hepatosplenomegaly. No bruits or masses. Good bowel sounds. Extremities: no cyanosis, clubbing, rash, edema  Neuro: alert & oriented x 3, cranial nerves grossly intact. moves all 4 extremities w/o difficulty. Affect pleasant.   Telemetry   NSR 90s 2-7 PVCs/hr (Personally reviewed)    EKG    No new EKG to review  Labs    CBC Recent Labs    09/01/22 0727 09/02/22 0500  WBC 7.7 6.7  HGB 9.0* 8.1*  HCT 29.3* 26.0*  MCV 94.5 94.2  PLT 267 595    Basic Metabolic Panel Recent Labs    08/31/22 0715 09/01/22 0727 09/01/22 1451 09/02/22 0500  NA 141   < > 138 139  K 3.8   < > 4.3 3.7  CL 111   < > 107 108  CO2 18*   < > 16* 20*  GLUCOSE 105*   < > 168* 151*  BUN 46*   < > 56* 58*  CREATININE 2.84*   < > 3.38* 3.33*  CALCIUM 8.8*   < > 9.0 8.7*  MG 2.1  --   --   --    < > = values in this  interval not displayed.    Liver Function Tests Recent Labs    09/01/22 1451  AST 26  ALT 21  ALKPHOS 96  BILITOT 1.1  PROT 6.2*  ALBUMIN 3.2*    No results for input(s): "LIPASE", "AMYLASE" in the last 72 hours. Cardiac Enzymes No results for input(s): "CKTOTAL", "CKMB", "CKMBINDEX", "TROPONINI" in the last 72 hours.  BNP: BNP (last 3 results) Recent Labs    07/07/22 1515 08/27/22 1658 08/30/22 1409  BNP 232.0* >5000.0* >4,500.0*     ProBNP (last 3 results) No results for input(s): "PROBNP" in the last 8760 hours.   D-Dimer No results for input(s): "DDIMER" in the last 72 hours.  Hemoglobin A1C No results for input(s): "HGBA1C" in the last 72 hours. Fasting Lipid Panel No results for input(s): "CHOL", "HDL", "LDLCALC", "TRIG", "CHOLHDL", "LDLDIRECT" in the last 72 hours. Thyroid Function Tests Recent Labs    09/02/22 0500  TSH 0.881    Other results:   Imaging    No results found.   Medications:     Scheduled Medications:  amiodarone  400 mg Oral BID   apixaban  2.5  mg Oral BID   azaTHIOprine  100 mg Oral Daily   Chlorhexidine Gluconate Cloth  6 each Topical Daily   folic acid  1 mg Oral Daily   furosemide  80 mg Intravenous BID   hydrocerin   Topical Daily   lidocaine  1 patch Transdermal Q24H   metolazone  2.5 mg Oral Once   pantoprazole  80 mg Oral Daily   predniSONE  5 mg Oral Q breakfast   sodium bicarbonate  650 mg Oral Daily   sodium chloride flush  10-40 mL Intracatheter Q12H   sodium chloride flush  3 mL Intravenous Q12H   sodium chloride flush  3 mL Intravenous Q12H   Vericiguat  10 mg Oral Daily    Infusions:  milrinone 0.25 mcg/kg/min (09/02/22 1517)    PRN Medications: acetaminophen **OR** acetaminophen, alum & mag hydroxide-simeth, ondansetron **OR** ondansetron (ZOFRAN) IV, senna-docusate, sodium chloride flush    Patient Profile   Ms. Stangeland is a 77 y.o. AAF with history of rheumatoid arthritis, atrial  fibrillation, CKD stage 3, post-herpetic neuralgia (shingles L upper back 3/23) and nonischemic cardiomyopathy. 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    Assessment/Plan   1. 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   - Echo 07/2022 EF improved 45-50%. RV normal  - RHC 12/21 low filling pressures and relatively preserved cardiac index at 2.22. - NYHA class IIIb/IV on admission - Volume status difficult to access with swollen lymph nodes/facial swelling. BNP >4,500 though JVD does not appear too elevated. Up 5 lbs since 08/04/22.  - Continue midodrine 5 mg tid.  - Continue Verquvo 10 mg daily.  - Repeat TTE on 09/01/22 with severe biventricular failure; IVC 2.5cm. Continue IV lasix '80mg'$  BID. Add on metolazone 2.'5mg'$  today.  - Milrinone 0.44mg/kg/min started yesterday. Mixed venous from 36.5 to 58%.  - sCr currently too high for coronary angiography; hip repair ?nail precludes her from CMR.  - Plan for RHC tomorrow.   2. AKI on CKD IV - She is followed by Dr. FRoyce Macadamia - Baseline 2.4-2.6 - sCr 3.33 today from 3.38 yesterday likely elevated due to hypoperfusion and venous congestion, IVC 2.5cm - IV lasix & metolazone.    3. Rheumatoid arthritis: No history of lung involvement. On low dose of prednisone chronically.    4. Atrial fibrillation/flutter: S/p TEE-DCCV in 4/22.  She is now on amiodarone and in NSR.  - Continue amiodarone 200 mg daily. LFTs and TSH normal (11/23). - Eliquis    5. Shingles: Resolved.    6. Left Hip Fracture - S/P IM Nail 07/2022 - will consult PT   7. Swollen lymph nodes - primarily L side - white patches seen in back of throat - discussed with primary - strep (-)   8. Microcytic anemia - 2/2 plaquenil? - per primary - Hgb 7 on admission. 9 today s/p 1uPRBc yesterday  Length of Stay: 3Morgan DO  09/02/2022, 3:59 PM  Advanced Heart Failure Team Pager  [phone](M-F; 7a - 5p)  Please contact [facility] for night-coverage after hours (5p -7a ) and weekends on [email or web address]  Marsden Zaino 4:08 PM

## 2022-09-02 NOTE — Plan of Care (Signed)

## 2022-09-02 NOTE — Progress Notes (Signed)
Progress Note   Patient: Tracy Nelson LPF:[id number] DOB: [birth year] DOA: 08/30/2022     3 DOS: the patient was seen and examined on 09/02/2022   Brief hospital course: Tracy Nelson was admitted to the hospital with the working diagnosis of heart failure decompensation.   77 y.o. 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  Patient reports 2 days of dyspnea, orthopnea, PND, and cough. Her symptoms were persistent despite use of oral diuretic therapy at home. On her initial physical examination her blood pressure was 120/92, HR 98, RR 21 and 02 saturation 100%, Lungs with decreased breath sounds, no rales or wheezing, heart with S1 and S2 present and rhythmic, abdomen with no distention, no lower extremity edema.     Assessment and Plan: * Acute on chronic HFrEF (heart failure with reduced ejection fraction) (HCC) Echocardiogram with reduced LV systolic function <53%, with global hypokinesis, severe reduction in RV systolic function, moderate MR, moderate to severe TR. Mild to moderate AI, Moderate pulmonic valve regurgitation. 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   Urine output is documented at 299  Systolic blood pressure is 109 to 110 mmHg.  SV02 57.9   Continue diuresis with furosemide 80 mg IV q12 One dose of metolazone 2,5 mg Inotropic support with milrinone   Elevated d-dimer Due to recent surgery and elevated D-dimer of 1.95 patient was started on IV heparin in the ED due to concern for possible PE despite chronic anticoagulation with Eliquis.  CTA chest not able to be performed due to CKD stage IV. -Can continue IV heparin for now -Follow-up VQ scan, if negative transition back to  Eliquis  CKD (chronic kidney disease), stage IV (HCC) Renal function with serum cr at 3,33 with K at 3,7 and serum bicarbonate at 20. Plan to continue diuresis and hemodynamic support.  Follow up renal function in am.   Paroxysmal atrial fibrillation (HCC) Continue rate control with amiodarone and anticoagulation with apixaban.   Anemia of chronic disease Chronic anemia due to CKD and iron deficiency.  Hemoglobin slightly below recent baseline.  No obvious bleeding.  Continue to monitor.  Rheumatoid arthritis involving multiple sites with positive rheumatoid factor (HCC) On chronic immunosuppressants.  Continue prednisone, Plaquenil, Imuran.        Subjective: Patient is feeling better but continue to be very weak, dyspnea stable, no chest pain   Physical Exam: Vitals:   09/01/22 1942 09/01/22 2344 09/02/22 0308 09/02/22 0811  BP: 122/85 116/81 110/80 109/71  Pulse:  98 94 93  Resp: '20 17 16 19  '$ Temp: 98.6 F (37 C) 98.4 F (36.9 C) 98.2 F (36.8 C) 97.8 F (36.6 C)  TempSrc: Oral Oral Oral Oral  SpO2:  98% 95% 100%  Weight:   49.8 kg   Height:       Neurology awake and alert ENT with mild pallor Cardiovascular with S1 and S2 present and rhythmic, no gallops, or murmurs, positive extra beats No JVD Respiratory with decreased breath sounds with no wheezing Abdomen with no distention  No lower extremity edema  Data Reviewed:    Family Communication: I spoke with patient's daughter's at the bedside, we talked in detail about patient's condition, plan of care and prognosis and all questions were addressed.  Disposition: Status is: Inpatient   Planned Discharge Destination: Home      Author: Tawni Millers, MD 09/02/2022 10:22 AM  For on call review [email or web address].

## 2022-09-03 ENCOUNTER — Encounter (HOSPITAL_COMMUNITY): Payer: Self-pay | Admitting: Cardiology

## 2022-09-03 ENCOUNTER — Ambulatory Visit (HOSPITAL_COMMUNITY): Admission: RE | Admit: 2022-09-03 | Payer: Medicare PPO | Source: Home / Self Care | Admitting: Cardiology

## 2022-09-03 ENCOUNTER — Encounter (HOSPITAL_COMMUNITY)
Admission: EM | Disposition: A | Discharge status: Home Health | Payer: Self-pay | Source: Home / Self Care | Attending: Internal Medicine

## 2022-09-03 DIAGNOSIS — I480 Paroxysmal atrial fibrillation: Secondary | ICD-10-CM | POA: Diagnosis not present

## 2022-09-03 DIAGNOSIS — I509 Heart failure, unspecified: Secondary | ICD-10-CM | POA: Insufficient documentation

## 2022-09-03 DIAGNOSIS — N184 Chronic kidney disease, stage 4 (severe): Secondary | ICD-10-CM | POA: Diagnosis not present

## 2022-09-03 DIAGNOSIS — D638 Anemia in other chronic diseases classified elsewhere: Secondary | ICD-10-CM | POA: Diagnosis not present

## 2022-09-03 DIAGNOSIS — L8990 Pressure ulcer of unspecified site, unspecified stage: Secondary | ICD-10-CM | POA: Insufficient documentation

## 2022-09-03 DIAGNOSIS — I5023 Acute on chronic systolic (congestive) heart failure: Secondary | ICD-10-CM | POA: Diagnosis not present

## 2022-09-03 HISTORY — PX: RIGHT HEART CATH: CATH118263

## 2022-09-03 LAB — POCT I-STAT EG7
Acid-Base Excess: 0 mmol/L (ref 0.0–2.0)
Acid-Base Excess: 0 mmol/L (ref 0.0–2.0)
Bicarbonate: 24.2 mmol/L (ref 20.0–28.0)
Bicarbonate: 24.3 mmol/L (ref 20.0–28.0)
Calcium, Ion: 1.3 mmol/L (ref 1.15–1.40)
Calcium, Ion: 1.3 mmol/L (ref 1.15–1.40)
HCT: 28 % — ABNORMAL LOW (ref 36.0–46.0)
HCT: 30 % — ABNORMAL LOW (ref 36.0–46.0)
Hemoglobin: 10.2 g/dL — ABNORMAL LOW (ref 12.0–15.0)
Hemoglobin: 9.5 g/dL — ABNORMAL LOW (ref 12.0–15.0)
O2 Saturation: 52 %
O2 Saturation: 55 %
Potassium: 4.3 mmol/L (ref 3.5–5.1)
Potassium: 4.3 mmol/L (ref 3.5–5.1)
Sodium: 139 mmol/L (ref 135–145)
Sodium: 140 mmol/L (ref 135–145)
TCO2: 25 mmol/L (ref 22–32)
TCO2: 25 mmol/L (ref 22–32)
pCO2, Ven: 37.9 mmHg — ABNORMAL LOW (ref 44–60)
pCO2, Ven: 38 mmHg — ABNORMAL LOW (ref 44–60)
pH, Ven: 7.413 (ref 7.25–7.43)
pH, Ven: 7.414 (ref 7.25–7.43)
pO2, Ven: 27 mmHg — CL (ref 32–45)
pO2, Ven: 28 mmHg — CL (ref 32–45)

## 2022-09-03 LAB — BASIC METABOLIC PANEL
Anion gap: 9 (ref 5–15)
BUN: 58 mg/dL — ABNORMAL HIGH (ref 8–23)
CO2: 23 mmol/L (ref 22–32)
Calcium: 9 mg/dL (ref 8.9–10.3)
Chloride: 106 mmol/L (ref 98–111)
Creatinine, Ser: 3.51 mg/dL — ABNORMAL HIGH (ref 0.44–1.00)
GFR, Estimated: 13 mL/min — ABNORMAL LOW (ref >60)
Glucose, Bld: 113 mg/dL — ABNORMAL HIGH (ref 70–99)
Potassium: 3.6 mmol/L (ref 3.5–5.1)
Sodium: 138 mmol/L (ref 135–145)

## 2022-09-03 LAB — COOXEMETRY PANEL
Carboxyhemoglobin: 1.3 % (ref 0.5–1.5)
Methemoglobin: <0.7 % (ref 0.0–1.5)
O2 Saturation: 56.4 %
Total hemoglobin: 8.7 g/dL — ABNORMAL LOW (ref 12.0–16.0)

## 2022-09-03 LAB — MAGNESIUM: Magnesium: 2.2 mg/dL (ref 1.7–2.4)

## 2022-09-03 SURGERY — RIGHT HEART CATH
Anesthesia: LOCAL

## 2022-09-03 MED ORDER — TORSEMIDE 20 MG PO TABS
20.0000 mg | ORAL_TABLET | Freq: Every day | ORAL | Status: DC
  Administered 2022-09-04 – 2022-09-05 (×2): 20 mg via ORAL
  Filled 2022-09-03 (×2): qty 1

## 2022-09-03 MED ORDER — HEPARIN (PORCINE) IN NACL 1000-0.9 UT/500ML-% IV SOLN
INTRAVENOUS | Status: DC | PRN
  Administered 2022-09-03 (×2): 500 mL

## 2022-09-03 MED ORDER — SODIUM CHLORIDE 0.9 % IV SOLN: INTRAVENOUS | Status: DC

## 2022-09-03 MED ORDER — LIDOCAINE HCL (PF) 1 % IJ SOLN
INTRAMUSCULAR | Status: DC | PRN
  Administered 2022-09-03: 5 mL via INTRADERMAL

## 2022-09-03 MED ORDER — SODIUM CHLORIDE 0.9% FLUSH
3.0000 mL | Freq: Two times a day (BID) | INTRAVENOUS | Status: DC
  Administered 2022-09-04 – 2022-09-11 (×7): 3 mL via INTRAVENOUS

## 2022-09-03 MED ORDER — SODIUM CHLORIDE 0.9 % IV SOLN: 250.0000 mL | INTRAVENOUS | Status: DC | PRN

## 2022-09-03 MED ORDER — LIDOCAINE HCL (PF) 1 % IJ SOLN
INTRAMUSCULAR | Status: AC
  Filled 2022-09-03: qty 30

## 2022-09-03 MED ORDER — POTASSIUM CHLORIDE CRYS ER 20 MEQ PO TBCR
20.0000 meq | EXTENDED_RELEASE_TABLET | Freq: Once | ORAL | Status: AC
  Administered 2022-09-03: 20 meq via ORAL
  Filled 2022-09-03: qty 1

## 2022-09-03 MED ORDER — SODIUM CHLORIDE 0.9% FLUSH: 3.0000 mL | INTRAVENOUS | Status: DC | PRN

## 2022-09-03 MED ORDER — HYDROCOD POLI-CHLORPHE POLI ER 10-8 MG/5ML PO SUER
5.0000 mL | Freq: Two times a day (BID) | ORAL | Status: DC | PRN
  Filled 2022-09-03: qty 5

## 2022-09-03 SURGICAL SUPPLY — 5 items
CATH BALLN WEDGE 5F 110CM (CATHETERS) IMPLANT
KIT HEART LEFT (KITS) ×1 IMPLANT
PACK CARDIAC CATHETERIZATION (CUSTOM PROCEDURE TRAY) ×1 IMPLANT
SHEATH GLIDE SLENDER 4/5FR (SHEATH) IMPLANT
TRANSDUCER W/STOPCOCK (MISCELLANEOUS) ×1 IMPLANT

## 2022-09-03 NOTE — Progress Notes (Signed)
Physical Therapy Treatment Patient Details Name: Tracy Nelson MRN: [id number] DOB: [birth year] Today's Date: 09/03/2022   History of Present Illness Patient is a 77 y/o female who presents on 1/21 with cough, SOB, weakness. CXR- small bilateral pleural effusions. Admitted with acute on chronic HF with possible PE started on Heparin drip. PMH includes HF, nonischemic cardiomyopathy, RA, CKD IV, recent IM nail hip 07/10/22, HTN.    PT Comments    Pt was seen for progressing her mobility but is reluctant to walk before her heart cath.  Pt is seen for bed ex as a result, and was requiring active assist to complete the exercises.  Follow along with pt for gait after cath tomorrow, and will progress gait as tolerated.  Monitor her for HR and sats, with no real fluctuations noted with bed ex.   Recommendations for follow up therapy are one component of a multi-disciplinary discharge planning process, led by the attending physician.  Recommendations may be updated based on patient status, additional functional criteria and insurance authorization.  Follow Up Recommendations  Home health PT     Assistance Recommended at Discharge Frequent or constant Supervision/Assistance  Patient can return home with the following A little help with walking and/or transfers;A little help with bathing/dressing/bathroom;Assistance with cooking/housework;Assist for transportation;Help with stairs or ramp for entrance   Equipment Recommendations  None recommended by PT    Recommendations for Other Services       Precautions / Restrictions Precautions Precautions: Fall Precaution Comments: monitor vitals, pending heart cath Restrictions Weight Bearing Restrictions: No LLE Weight Bearing: Weight bearing as tolerated     Mobility  Bed Mobility Overal bed mobility: Modified Independent             General bed mobility comments: shifting to reposition on the bed    Transfers                    General transfer comment: declined    Ambulation/Gait               General Gait Details: declined   Stairs             Wheelchair Mobility    Modified Rankin (Stroke Patients Only)       Balance                                            Cognition Arousal/Alertness: Awake/alert Behavior During Therapy: Flat affect Overall Cognitive Status: Within Functional Limits for tasks assessed                                          Exercises General Exercises - Lower Extremity Ankle Circles/Pumps: AAROM, 5 reps Quad Sets: AROM, 10 reps Gluteal Sets: AROM, 10 reps Heel Slides: AROM, AAROM, 10 reps Hip ABduction/ADduction: AROM, AAROM, 10 reps    General Comments General comments (skin integrity, edema, etc.): pt is awaiting a heart cath today, and declines OOB but agrees to bed ex's      Pertinent Vitals/Pain Pain Assessment Pain Assessment: No/denies pain    Home Living                          Prior Function  PT Goals (current goals can now be found in the care plan section) Acute Rehab PT Goals Patient Stated Goal: to return home Progress towards PT goals: Not progressing toward goals - comment    Frequency    Min 3X/week      PT Plan Current plan remains appropriate    Co-evaluation              AM-PAC PT "6 Clicks" Mobility   Outcome Measure  Help needed turning from your back to your side while in a flat bed without using bedrails?: None Help needed moving from lying on your back to sitting on the side of a flat bed without using bedrails?: None Help needed moving to and from a bed to a chair (including a wheelchair)?: A Little Help needed standing up from a chair using your arms (e.g., wheelchair or bedside chair)?: A Little Help needed to walk in hospital room?: A Little Help needed climbing 3-5 steps with a railing? : A Lot 6 Click Score: 19    End of Session    Activity Tolerance: Patient limited by fatigue Patient left: in bed;with call bell/phone within reach Nurse Communication: Mobility status PT Visit Diagnosis: Muscle weakness (generalized) (M62.81);Difficulty in walking, not elsewhere classified (R26.2)     Time: 1345-1406 PT Time Calculation (min) (ACUTE ONLY): 21 min  Charges:  $Therapeutic Exercise: 8-22 mins        Ramond Dial 09/03/2022, 3:40 PM  Mee Hives, PT PhD Acute Rehab Dept. Number: [place] [facility] [place]

## 2022-09-03 NOTE — Progress Notes (Signed)
Pharmacist Heart Failure Core Measure Documentation  Assessment: Tracy Nelson has an EF documented as <20% on 09/01/22 by Dorris Carnes, MD.  Rationale: Heart failure patients with left ventricular systolic dysfunction (LVSD) and an EF < 40% should be prescribed an angiotensin converting enzyme inhibitor (ACEI) or angiotensin receptor blocker (ARB) at discharge unless a contraindication is documented in the medical record.  This patient is not currently on an ACEI or ARB for HF.  This note is being placed in the record in order to provide documentation that a contraindication to the use of these agents is present for this encounter.  ACE Inhibitor or Angiotensin Receptor Blocker is contraindicated (specify all that apply)  '[]'$   ACEI allergy AND ARB allergy '[]'$   Angioedema '[]'$   Moderate or severe aortic stenosis '[]'$   Hyperkalemia '[]'$   Hypotension '[]'$   Renal artery stenosis '[x]'$   Worsening renal function, preexisting renal disease or dysfunction   Margretta Sidle Dohlen 09/03/2022 2:29 PM

## 2022-09-03 NOTE — Progress Notes (Addendum)
Advanced Heart Failure Rounding Note  PCP-Cardiologist: None   Subjective:    Remains on milrinone 0.25 and IV lasix. Feels weak. Denies SOB.   Co-ox 56% CVP 9-10 SCr 3.3 -> 3.5  Objective:   Weight Range: 49.2 kg Body mass index is 19.21 kg/m.   Vital Signs:   Temp:  [97.8 F (36.6 C)-98.5 F (36.9 C)] 98.4 F (36.9 C) (01/25 0308) Pulse Rate:  [93-107] 102 (01/25 0308) Resp:  [18-22] 18 (01/25 0308) BP: (109-127)/(71-102) 113/91 (01/25 0308) SpO2:  [97 %-100 %] 99 % (01/25 0308) Weight:  [49.2 kg] 49.2 kg (01/25 0308) Last BM Date : 08/31/22  Weight change: Filed Weights   09/01/22 0402 09/02/22 0308 09/03/22 0308  Weight: 49.1 kg 49.8 kg 49.2 kg    Intake/Output:   Intake/Output Summary (Last 24 hours) at 09/03/2022 0716 Last data filed at 09/03/2022 0100 Gross per 24 hour  Intake 240 ml  Output 1000 ml  Net -760 ml       Physical Exam    General:  chronically ill appearing.  No respiratory difficulty HEENT: normal Neck: supple. JVP 9. Carotids 2+ bilat; no bruits. No lymphadenopathy or thryomegaly appreciated. Cor: PMI nondisplaced. Regular rate & rhythm. No rubs, gallops or murmurs. Lungs: clear Abdomen: soft, nontender, nondistended. No hepatosplenomegaly. No bruits or masses. Good bowel sounds. Extremities: no cyanosis, clubbing, rash, edema Neuro: alert & orientedx3, cranial nerves grossly intact. moves all 4 extremities w/o difficulty. Affect pleasant  Telemetry   NSR 90s 2-7 PVCs/hr (Personally reviewed)    EKG    No new EKG to review  Labs    CBC Recent Labs    09/01/22 0727 09/02/22 0500  WBC 7.7 6.7  HGB 9.0* 8.1*  HCT 29.3* 26.0*  MCV 94.5 94.2  PLT 267 299    Basic Metabolic Panel Recent Labs    09/02/22 0500 09/03/22 0312  NA 139 138  K 3.7 3.6  CL 108 106  CO2 20* 23  GLUCOSE 151* 113*  BUN 58* 58*  CREATININE 3.33* 3.51*  CALCIUM 8.7* 9.0  MG  --  2.2    Liver Function Tests Recent Labs     09/01/22 1451  AST 26  ALT 21  ALKPHOS 96  BILITOT 1.1  PROT 6.2*  ALBUMIN 3.2*    No results for input(s): "LIPASE", "AMYLASE" in the last 72 hours. Cardiac Enzymes No results for input(s): "CKTOTAL", "CKMB", "CKMBINDEX", "TROPONINI" in the last 72 hours.  BNP: BNP (last 3 results) Recent Labs    07/07/22 1515 08/27/22 1658 08/30/22 1409  BNP 232.0* >5000.0* >4,500.0*     ProBNP (last 3 results) No results for input(s): "PROBNP" in the last 8760 hours.   D-Dimer No results for input(s): "DDIMER" in the last 72 hours.  Hemoglobin A1C No results for input(s): "HGBA1C" in the last 72 hours. Fasting Lipid Panel No results for input(s): "CHOL", "HDL", "LDLCALC", "TRIG", "CHOLHDL", "LDLDIRECT" in the last 72 hours. Thyroid Function Tests Recent Labs    09/02/22 0500  TSH 0.881     Other results:   Imaging    No results found.   Medications:     Scheduled Medications:  amiodarone  400 mg Oral BID   apixaban  2.5 mg Oral BID   azaTHIOprine  100 mg Oral Daily   Chlorhexidine Gluconate Cloth  6 each Topical Daily   folic acid  1 mg Oral Daily   furosemide  80 mg Intravenous BID   hydrocerin  Topical Daily   lidocaine  1 patch Transdermal Q24H   pantoprazole  80 mg Oral Daily   predniSONE  5 mg Oral Q breakfast   sodium bicarbonate  650 mg Oral Daily   sodium chloride flush  10-40 mL Intracatheter Q12H   sodium chloride flush  3 mL Intravenous Q12H   sodium chloride flush  3 mL Intravenous Q12H   Vericiguat  10 mg Oral Daily    Infusions:  sodium chloride     sodium chloride 10 mL/hr at 09/03/22 0656   milrinone 0.25 mcg/kg/min (09/02/22 1517)    PRN Medications: sodium chloride, acetaminophen **OR** acetaminophen, alum & mag hydroxide-simeth, chlorpheniramine-HYDROcodone, guaiFENesin-dextromethorphan, ondansetron **OR** ondansetron (ZOFRAN) IV, senna-docusate, sodium chloride flush, sodium chloride flush    Patient Profile   Tracy Nelson  is a 77 y.o. AAF with history of rheumatoid arthritis, atrial fibrillation, CKD stage 3, post-herpetic neuralgia (shingles L upper back 3/23) and nonischemic cardiomyopathy. 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    Assessment/Plan   1. 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   - Echo 07/2022 EF improved 45-50%. RV normal  - RHC 12/21 low filling pressures and relatively preserved cardiac index at 2.22. - Echo 09/01/22 EF < 20% RV severely reduced - NYHA class IIIb/IV on admission - Remains on milrinone 0.25. Co-ox marginal at 56% (up from 36%) - Volume status improved CVP 9-10 - probably as good as we are going to get it with RV Failure - Continue midodrine 5 mg tid.  - Continue Verquvo 10 mg daily.  - Repeat TTE on 09/01/22 with severe biventricular failure - ip repair ?nail precludes her from CMR.  - Suspect she is end-stage. Continue milrinone for now. Hold lasix - Plan for RHC today per Dr. Aundra Dubin - Consider [place] involvement  2. AKI on CKD IV - She is followed by Dr. Royce Macadamia. - Baseline 2.4-2.6 - sCr 3.5 today from 3.5 yesterday  - Hold diuretics today   3. Rheumatoid arthritis: No history of lung involvement. On low dose of prednisone chronically.    4. Atrial fibrillation/flutter: S/p TEE-DCCV in 4/22.  She is now on amiodarone and in NSR.  - Continue amiodarone 200 mg daily. LFTs and TSH normal (11/23). - Eliquis    5. Shingles: Resolved.    6. Left Hip Fracture - S/P IM Nail 07/2022 - PT/OT   7. Swollen lymph nodes - primarily L side - white patches seen in back of throat - discussed with primary - strep (-)   8. Microcytic anemia - 2/2 plaquenil? - per primary - Hgb 7 on admission. 9 -> 8.1 today s/p 1uPRBc 09/02/22  Length of Stay: [employer], MD  09/03/2022, 7:16 AM  Advanced Heart Failure Team Pager [phone] (M-F; 7a - 5p)  Please contact [facility] for night-coverage after hours (5p -7a ) and weekends on [email or web address]

## 2022-09-03 NOTE — H&P (View-Only) (Signed)
Advanced Heart Failure Rounding Note  PCP-Cardiologist: None   Subjective:    Remains on milrinone 0.25 and IV lasix. Feels weak. Denies SOB.   Co-ox 56% CVP 9-10 SCr 3.3 -> 3.5  Objective:   Weight Range: 49.2 kg Body mass index is 19.21 kg/m.   Vital Signs:   Temp:  [97.8 F (36.6 C)-98.5 F (36.9 C)] 98.4 F (36.9 C) (01/25 0308) Pulse Rate:  [93-107] 102 (01/25 0308) Resp:  [18-22] 18 (01/25 0308) BP: (109-127)/(71-102) 113/91 (01/25 0308) SpO2:  [97 %-100 %] 99 % (01/25 0308) Weight:  [49.2 kg] 49.2 kg (01/25 0308) Last BM Date : 08/31/22  Weight change: Filed Weights   09/01/22 0402 09/02/22 0308 09/03/22 0308  Weight: 49.1 kg 49.8 kg 49.2 kg    Intake/Output:   Intake/Output Summary (Last 24 hours) at 09/03/2022 0716 Last data filed at 09/03/2022 0100 Gross per 24 hour  Intake 240 ml  Output 1000 ml  Net -760 ml       Physical Exam    General:  chronically ill appearing.  No respiratory difficulty HEENT: normal Neck: supple. JVP 9. Carotids 2+ bilat; no bruits. No lymphadenopathy or thryomegaly appreciated. Cor: PMI nondisplaced. Regular rate & rhythm. No rubs, gallops or murmurs. Lungs: clear Abdomen: soft, nontender, nondistended. No hepatosplenomegaly. No bruits or masses. Good bowel sounds. Extremities: no cyanosis, clubbing, rash, edema Neuro: alert & orientedx3, cranial nerves grossly intact. moves all 4 extremities w/o difficulty. Affect pleasant  Telemetry   NSR 90s 2-7 PVCs/hr (Personally reviewed)    EKG    No new EKG to review  Labs    CBC Recent Labs    09/01/22 0727 09/02/22 0500  WBC 7.7 6.7  HGB 9.0* 8.1*  HCT 29.3* 26.0*  MCV 94.5 94.2  PLT 267 315    Basic Metabolic Panel Recent Labs    09/02/22 0500 09/03/22 0312  NA 139 138  K 3.7 3.6  CL 108 106  CO2 20* 23  GLUCOSE 151* 113*  BUN 58* 58*  CREATININE 3.33* 3.51*  CALCIUM 8.7* 9.0  MG  --  2.2    Liver Function Tests Recent Labs     09/01/22 1451  AST 26  ALT 21  ALKPHOS 96  BILITOT 1.1  PROT 6.2*  ALBUMIN 3.2*    No results for input(s): "LIPASE", "AMYLASE" in the last 72 hours. Cardiac Enzymes No results for input(s): "CKTOTAL", "CKMB", "CKMBINDEX", "TROPONINI" in the last 72 hours.  BNP: BNP (last 3 results) Recent Labs    07/07/22 1515 08/27/22 1658 08/30/22 1409  BNP 232.0* >5000.0* >4,500.0*     ProBNP (last 3 results) No results for input(s): "PROBNP" in the last 8760 hours.   D-Dimer No results for input(s): "DDIMER" in the last 72 hours.  Hemoglobin A1C No results for input(s): "HGBA1C" in the last 72 hours. Fasting Lipid Panel No results for input(s): "CHOL", "HDL", "LDLCALC", "TRIG", "CHOLHDL", "LDLDIRECT" in the last 72 hours. Thyroid Function Tests Recent Labs    09/02/22 0500  TSH 0.881     Other results:   Imaging    No results found.   Medications:     Scheduled Medications:  amiodarone  400 mg Oral BID   apixaban  2.5 mg Oral BID   azaTHIOprine  100 mg Oral Daily   Chlorhexidine Gluconate Cloth  6 each Topical Daily   folic acid  1 mg Oral Daily   furosemide  80 mg Intravenous BID   hydrocerin  Topical Daily   lidocaine  1 patch Transdermal Q24H   pantoprazole  80 mg Oral Daily   predniSONE  5 mg Oral Q breakfast   sodium bicarbonate  650 mg Oral Daily   sodium chloride flush  10-40 mL Intracatheter Q12H   sodium chloride flush  3 mL Intravenous Q12H   sodium chloride flush  3 mL Intravenous Q12H   Vericiguat  10 mg Oral Daily    Infusions:  sodium chloride     sodium chloride 10 mL/hr at 09/03/22 0656   milrinone 0.25 mcg/kg/min (09/02/22 1517)    PRN Medications: sodium chloride, acetaminophen **OR** acetaminophen, alum & mag hydroxide-simeth, chlorpheniramine-HYDROcodone, guaiFENesin-dextromethorphan, ondansetron **OR** ondansetron (ZOFRAN) IV, senna-docusate, sodium chloride flush, sodium chloride flush    Patient Profile   Tracy Nelson  is a 77 y.o. AAF with history of rheumatoid arthritis, atrial fibrillation, CKD stage 3, post-herpetic neuralgia (shingles L upper back 3/23) and nonischemic cardiomyopathy. 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    Assessment/Plan   1. 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   - Echo 07/2022 EF improved 45-50%. RV normal  - RHC 12/21 low filling pressures and relatively preserved cardiac index at 2.22. - Echo 09/01/22 EF < 20% RV severely reduced - NYHA class IIIb/IV on admission - Remains on milrinone 0.25. Co-ox marginal at 56% (up from 36%) - Volume status improved CVP 9-10 - probably as good as we are going to get it with RV Failure - Continue midodrine 5 mg tid.  - Continue Verquvo 10 mg daily.  - Repeat TTE on 09/01/22 with severe biventricular failure - ip repair ?nail precludes her from CMR.  - Suspect she is end-stage. Continue milrinone for now. Hold lasix - Plan for RHC today per Dr. Aundra Dubin - Consider  involvement  2. AKI on CKD IV - She is followed by Dr. Royce Macadamia. - Baseline 2.4-2.6 - sCr 3.5 today from 3.5 yesterday  - Hold diuretics today   3. Rheumatoid arthritis: No history of lung involvement. On low dose of prednisone chronically.    4. Atrial fibrillation/flutter: S/p TEE-DCCV in 4/22.  She is now on amiodarone and in NSR.  - Continue amiodarone 200 mg daily. LFTs and TSH normal (11/23). - Eliquis    5. Shingles: Resolved.    6. Left Hip Fracture - S/P IM Nail 07/2022 - PT/OT   7. Swollen lymph nodes - primarily L side - white patches seen in back of throat - discussed with primary - strep (-)   8. Microcytic anemia - 2/2 plaquenil? - per primary - Hgb 7 on admission. 9 -> 8.1 today s/p 1uPRBc 09/02/22  Length of Stay: [place], MD  09/03/2022, 7:16 AM  Advanced Heart Failure Team Pager [phone] (M-F; 7a - 5p)  Please contact [facility] for night-coverage after hours (5p -7a ) and weekends on [email or web address]

## 2022-09-03 NOTE — Progress Notes (Signed)
Pre-cath NPO order placed to begin at 1000 1/25. Informed cath lab RN; patient is to be kept NPO this morning starting now and 1000 dose of eliquis should not be provided if catheterization does not occur prior to this time.

## 2022-09-03 NOTE — Plan of Care (Signed)
  Problem: Education: Goal: Knowledge of General Education information will improve Description: Including pain rating scale, medication(s)/side effects and non-pharmacologic comfort measures Outcome: Progressing   Problem: Health Behavior/Discharge Planning: Goal: Ability to manage health-related needs will improve Outcome: Progressing   Problem: Cardiac: Goal: Ability to achieve and maintain adequate cardiopulmonary perfusion will improve Outcome: Progressing   Problem: Clinical Measurements: Goal: Respiratory complications will improve Outcome: Progressing   Problem: Activity: Goal: Risk for activity intolerance will decrease Outcome: Progressing   Problem: Coping: Goal: Level of anxiety will decrease Outcome: Progressing   Problem: Pain Managment: Goal: General experience of comfort will improve Outcome: Progressing   Problem: Safety: Goal: Ability to remain free from injury will improve Outcome: Progressing   Problem: Skin Integrity: Goal: Risk for impaired skin integrity will decrease Outcome: Progressing   Problem: Cardiovascular: Goal: Ability to achieve and maintain adequate cardiovascular perfusion will improve Outcome: Progressing Goal: Vascular access site(s) Level 0-1 will be maintained Outcome: Progressing

## 2022-09-03 NOTE — Interval H&P Note (Signed)
History and Physical Interval Note:  09/03/2022 3:58 PM  Tracy Nelson  has presented today for surgery, with the diagnosis of heart failure.  The various methods of treatment have been discussed with the patient and family. After consideration of risks, benefits and other options for treatment, the patient has consented to  Procedure(s): RIGHT HEART CATH (N/A) as a surgical intervention.  The patient's history has been reviewed, patient examined, no change in status, stable for surgery.  I have reviewed the patient's chart and labs.  Questions were answered to the patient's satisfaction.     Ramiah Helfrich [employer]

## 2022-09-03 NOTE — Progress Notes (Signed)
TR[facility] night cross cover note:   I was notified by patient's RN that she continues to experience a productive cough, that improved only slightly via dose from existing order for prn guaifenesin/dextromethorphan.   Per my chart review, it does not appear that she is on any scheduled or as needed opioid medications at this time.  I subsequently placed order for prn Tussionex for refractory cough and also added flutter valve.    Babs Bertin, DO Hospitalist

## 2022-09-03 NOTE — Assessment & Plan Note (Signed)
Per 08/31/22 [facility] note "Deep tissue pressure injury to right posterior heel.  Present on admission

## 2022-09-03 NOTE — Progress Notes (Addendum)
Progress Note   Patient: Tracy Nelson ZOX:[id number] DOB: Dec 26, 1945 DOA: 08/30/2022     4 DOS: the patient was seen and examined on 09/03/2022   Brief hospital course: Tracy Nelson was admitted to the hospital with the working diagnosis of heart failure decompensation.   77 y.o. 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  Patient reports 2 days of dyspnea, orthopnea, PND, and cough. Her symptoms were persistent despite use of oral diuretic therapy at home. On her initial physical examination her blood pressure was 120/92, HR 98, RR 21 and 02 saturation 100%, Lungs with decreased breath sounds, no rales or wheezing, heart with S1 and S2 present and rhythmic, abdomen with no distention, no lower extremity edema.     Assessment and Plan: * Acute on chronic HFrEF (heart failure with reduced ejection fraction) (HCC) Echocardiogram with reduced LV systolic function <81%, with global hypokinesis, severe reduction in RV systolic function, moderate MR, moderate to severe TR. Mild to moderate AI, Moderate pulmonic valve regurgitation. 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   Urine output is documented at 1914  Systolic blood pressure is 107 to 137 mmHg.  SV02 56.4   01/25 right heart catheterization.  PA 48/14 with mean 29 PCWP 17  PVR 2.8 wu Cardiac output is 4.2 and index 2,8 (fick).   Pre and post capillary pulmonary hypertension.    Continue inotropic support with milrinone.  Transition to oral loop diuretic tomorrow  Elevated d-dimer Due to recent surgery and elevated D-dimer of 1.95 patient was started on IV heparin in the ED due to concern for possible PE despite chronic anticoagulation  with Eliquis.  CTA chest not able to be performed due to CKD stage IV. -Can continue IV heparin for now -Follow-up VQ scan, if negative transition back to Eliquis  CKD (chronic kidney disease), stage IV (HCC) Chronic metabolic acidosis  Serum cr today is 3,51 with K at 3,6 and serum bicarbonate at 23.  Na 138 Mg 2,2   Transition to oral torsemide tomorrow. Continue milrinone for hemodynamic support. Follow up renal function and electrolytes in am,  Paroxysmal atrial fibrillation (HCC) Continue rate control with amiodarone and anticoagulation with apixaban.   Anemia of chronic disease Chronic anemia due to CKD and iron deficiency.  Hemoglobin slightly below recent baseline.  No obvious bleeding.  Continue to monitor.  Rheumatoid arthritis involving multiple sites with positive rheumatoid factor (HCC) On chronic immunosuppressants.  Continue prednisone, Plaquenil, Imuran.  Pressure ulcer Per 08/31/22 [facility] note "Deep tissue pressure injury to right posterior heel.  Present on admission        Subjective: Patient had cough this am, dyspnea continue stable, no chest pain, poor mobility.   Physical Exam: Vitals:   09/02/22 1930 09/03/22 0020 09/03/22 0308 09/03/22 0830  BP: 127/80 (!) 114/102 (!) 113/91 113/81  Pulse:  (!) 107 (!) 102 98  Resp: '18 18 18 20  '$ Temp: 98.2 F (36.8 C) 98.4 F (36.9 C) 98.4 F (36.9 C) 98.7 F (37.1 C)  TempSrc: Oral Oral Oral Oral  SpO2: 100% 97% 99% 97%  Weight:   49.2 kg   Height:       Neurology awake and alert ENT with mild pallor Cardiovascular with S1 and S2 present and rhythmic  with no gallops, rubs or murmurs Respiratory with no rales or wheezing Abdomen with mild distention  Data Reviewed:    Family Communication: no family at the bedside   Disposition: Status is: Inpatient Remains inpatient appropriate because: IV diuresis, right heart cath   Planned Discharge Destination: Home    Author: Tawni Millers,  MD 09/03/2022 11:35 AM  For on call review [email or web address].

## 2022-09-04 DIAGNOSIS — I5023 Acute on chronic systolic (congestive) heart failure: Secondary | ICD-10-CM | POA: Diagnosis not present

## 2022-09-04 DIAGNOSIS — N184 Chronic kidney disease, stage 4 (severe): Secondary | ICD-10-CM | POA: Diagnosis not present

## 2022-09-04 DIAGNOSIS — I480 Paroxysmal atrial fibrillation: Secondary | ICD-10-CM | POA: Diagnosis not present

## 2022-09-04 DIAGNOSIS — D638 Anemia in other chronic diseases classified elsewhere: Secondary | ICD-10-CM | POA: Diagnosis not present

## 2022-09-04 LAB — BASIC METABOLIC PANEL
Anion gap: 9 (ref 5–15)
BUN: 55 mg/dL — ABNORMAL HIGH (ref 8–23)
CO2: 22 mmol/L (ref 22–32)
Calcium: 8.8 mg/dL — ABNORMAL LOW (ref 8.9–10.3)
Chloride: 104 mmol/L (ref 98–111)
Creatinine, Ser: 3.11 mg/dL — ABNORMAL HIGH (ref 0.44–1.00)
GFR, Estimated: 15 mL/min — ABNORMAL LOW (ref >60)
Glucose, Bld: 113 mg/dL — ABNORMAL HIGH (ref 70–99)
Potassium: 4.1 mmol/L (ref 3.5–5.1)
Sodium: 135 mmol/L (ref 135–145)

## 2022-09-04 LAB — MULTIPLE MYELOMA PANEL, SERUM
Albumin SerPl Elph-Mcnc: 3.3 g/dL (ref 2.9–4.4)
Albumin/Glob SerPl: 1.2 (ref 0.7–1.7)
Alpha 1: 0.3 g/dL (ref 0.0–0.4)
Alpha2 Glob SerPl Elph-Mcnc: 0.7 g/dL (ref 0.4–1.0)
B-Globulin SerPl Elph-Mcnc: 0.9 g/dL (ref 0.7–1.3)
Gamma Glob SerPl Elph-Mcnc: 0.9 g/dL (ref 0.4–1.8)
Globulin, Total: 2.8 g/dL (ref 2.2–3.9)
IgA: 159 mg/dL (ref 64–422)
IgG (Immunoglobin G), Serum: 1020 mg/dL (ref 586–1602)
IgM (Immunoglobulin M), Srm: 44 mg/dL (ref 26–217)
M Protein SerPl Elph-Mcnc: 0.3 g/dL — ABNORMAL HIGH
Total Protein ELP: 6.1 g/dL (ref 6.0–8.5)

## 2022-09-04 LAB — COOXEMETRY PANEL
Carboxyhemoglobin: 1.2 % (ref 0.5–1.5)
Methemoglobin: <0.7 % (ref 0.0–1.5)
O2 Saturation: 66.2 %
Total hemoglobin: 9 g/dL — ABNORMAL LOW (ref 12.0–16.0)

## 2022-09-04 LAB — MAGNESIUM: Magnesium: 2.1 mg/dL (ref 1.7–2.4)

## 2022-09-04 MED ORDER — MILRINONE LACTATE IN DEXTROSE 20-5 MG/100ML-% IV SOLN
0.1250 ug/kg/min | INTRAVENOUS | Status: DC
  Administered 2022-09-04 – 2022-09-07 (×3): 0.125 ug/kg/min via INTRAVENOUS
  Filled 2022-09-04 (×2): qty 100

## 2022-09-04 NOTE — Progress Notes (Addendum)
Advanced Heart Failure Rounding Note  PCP-Cardiologist: None   Subjective:   RHC yesterday on milrinone .25 Hemodynamics RA mean 10 RV 41/13 PA 48/14 mean 29 PCWP mean 17 Oxygen saturations: PA 54% AO 97% Cardiac Output (Fick) 4.22  Cardiac Index (Fick) 2.81 PVR 2.8 WU PAPi 3.4   Remains on milrinone 0.25, co-ox 66%. Feels weak. Denies SOB.    CVP 5 SCr 3.3 -> 3.5-> 3.1  Objective:   Weight Range: 47.9 kg Body mass index is 18.71 kg/m.   Vital Signs:   Temp:  [98 F (36.7 C)-98.7 F (37.1 C)] 98 F (36.7 C) (01/26 0500) Pulse Rate:  [0-112] 100 (01/26 0500) Resp:  [13-20] 19 (01/26 0500) BP: (91-137)/(54-102) 122/90 (01/26 0500) SpO2:  [92 %-99 %] 99 % (01/26 0500) Weight:  [47.9 kg] 47.9 kg (01/26 0500) Last BM Date : 08/31/22  Weight change: Filed Weights   09/02/22 0308 09/03/22 0308 09/04/22 0500  Weight: 49.8 kg 49.2 kg 47.9 kg    Intake/Output:   Intake/Output Summary (Last 24 hours) at 09/04/2022 0725 Last data filed at 09/04/2022 0600 Gross per 24 hour  Intake 655.83 ml  Output 2200 ml  Net -1544.17 ml      Physical Exam   CVP 5 General:  chronically ill appearing.  No respiratory difficulty HEENT: normal Neck: supple. JVD ~6 cm. Carotids 2+ bilat; no bruits. No lymphadenopathy or thyromegaly appreciated. Cor: PMI nondisplaced. Regular rate & rhythm. No rubs, gallops or murmurs. Lungs: clear, diminished bases Abdomen: soft, nontender, nondistended. No hepatosplenomegaly. No bruits or masses. Good bowel sounds. Extremities: no cyanosis, clubbing, rash, edema. PICC RUE Neuro: alert & oriented x 3, cranial nerves grossly intact. moves all 4 extremities w/o difficulty. Affect pleasant.   Telemetry   NSR low 100s 2-6 PVCs/hr (Personally reviewed)    EKG    No new EKG to review  Labs    CBC Recent Labs    09/01/22 0727 09/02/22 0500 09/03/22 1609 09/03/22 1610  WBC 7.7 6.7  --   --   HGB 9.0* 8.1* 9.5* 10.2*  HCT 29.3*  26.0* 28.0* 30.0*  MCV 94.5 94.2  --   --   PLT 267 224  --   --    Basic Metabolic Panel Recent Labs    09/03/22 0312 09/03/22 1609 09/03/22 1610 09/04/22 0400  NA 138   < > 140 135  K 3.6   < > 4.3 4.1  CL 106  --   --  104  CO2 23  --   --  22  GLUCOSE 113*  --   --  113*  BUN 58*  --   --  55*  CREATININE 3.51*  --   --  3.11*  CALCIUM 9.0  --   --  8.8*  MG 2.2  --   --  2.1   < > = values in this interval not displayed.   Liver Function Tests Recent Labs    09/01/22 1451  AST 26  ALT 21  ALKPHOS 96  BILITOT 1.1  PROT 6.2*  ALBUMIN 3.2*   No results for input(s): "LIPASE", "AMYLASE" in the last 72 hours. Cardiac Enzymes No results for input(s): "CKTOTAL", "CKMB", "CKMBINDEX", "TROPONINI" in the last 72 hours.  BNP: BNP (last 3 results) Recent Labs    07/07/22 1515 08/27/22 1658 08/30/22 1409  BNP 232.0* >5000.0* >4,500.0*    ProBNP (last 3 results) No results for input(s): "PROBNP" in the last 8760 hours.  D-Dimer No results for input(s): "DDIMER" in the last 72 hours.  Hemoglobin A1C No results for input(s): "HGBA1C" in the last 72 hours. Fasting Lipid Panel No results for input(s): "CHOL", "HDL", "LDLCALC", "TRIG", "CHOLHDL", "LDLDIRECT" in the last 72 hours. Thyroid Function Tests Recent Labs    09/02/22 0500  TSH 0.881    Other results:   Imaging    CARDIAC CATHETERIZATION  Result Date: 09/03/2022 1. Good cardiac output on milrinone. 2. Mildly elevated right and left heart filling pressures with mild pulmonary venous hypertension. Continue milrinone for now to see if we can get her renal function back to her baseline.  Will add back oral diuretic tomorrow.  Long-term, poor candidate for advanced therapies given frailty/deconditioning and renal dysfunction.  Would consider her for palliative home milrinone if unable to successfully wean off this admission.     Medications:     Scheduled Medications:  amiodarone  400 mg Oral BID    apixaban  2.5 mg Oral BID   azaTHIOprine  100 mg Oral Daily   Chlorhexidine Gluconate Cloth  6 each Topical Daily   folic acid  1 mg Oral Daily   hydrocerin   Topical Daily   lidocaine  1 patch Transdermal Q24H   pantoprazole  80 mg Oral Daily   predniSONE  5 mg Oral Q breakfast   sodium bicarbonate  650 mg Oral Daily   sodium chloride flush  10-40 mL Intracatheter Q12H   sodium chloride flush  3 mL Intravenous Q12H   sodium chloride flush  3 mL Intravenous Q12H   sodium chloride flush  3 mL Intravenous Q12H   torsemide  20 mg Oral Daily   Vericiguat  10 mg Oral Daily    Infusions:  milrinone 0.25 mcg/kg/min (09/04/22 0600)    PRN Medications: acetaminophen **OR** acetaminophen, alum & mag hydroxide-simeth, chlorpheniramine-HYDROcodone, guaiFENesin-dextromethorphan, ondansetron **OR** ondansetron (ZOFRAN) IV, senna-docusate, sodium chloride flush    Patient Profile   Tracy Nelson is a 77 y.o. AAF with history of rheumatoid arthritis, atrial fibrillation, CKD stage 3, post-herpetic neuralgia (shingles L upper back 3/23) and nonischemic cardiomyopathy. 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    Assessment/Plan   1. 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   - Echo 07/2022 EF improved 45-50%. RV normal  - RHC 12/21 low filling pressures and relatively preserved cardiac index at 2.22. - Echo 09/01/22 EF < 20% RV severely reduced - NYHA class IIIb/IV on admission - Remains on milrinone 0.25 will decrease to .125 today. Co-ox 66% - Volume status improving CVP 5 - Continue midodrine 5 mg tid.  - Continue Verquvo 10 mg daily.  - Repeat TTE on 09/01/22 with severe biventricular failure - hip repair ?nail precludes her from CMR.  - Suspect she is end-stage. Continue milrinone for now. Will start 20 Torsemide daily today, follow cr w/ diuresis - RHC 1/25: Good CO on milrinone. Mildly elevated right and  left heart filling pressures with mild pulmonary venous hypertension.  - Will have Palliative come talk to her today  2. AKI on CKD IV - She is followed by Dr. Royce Macadamia. - Baseline 2.4-2.6 - sCr 3.5>3.1 today   3. Rheumatoid arthritis: No history of lung involvement. On low dose of prednisone chronically.    4. Atrial fibrillation/flutter: S/p TEE-DCCV in 4/22.  She is now on amiodarone and in NSR.  - Continue amiodarone 200 mg daily. LFTs and TSH normal (11/23). - Eliquis  5. Shingles: Resolved.    6. Left Hip Fracture - S/P IM Nail 07/2022 - PT/OT   7. Swollen lymph nodes - primarily L side - white patches seen in back of throat - discussed with primary - strep (-)   8. Microcytic anemia - 2/2 plaquenil? - per primary - Hgb 7 on admission. 9 -> 8.1 most recently s/p 1uPRBc 09/02/22  Length of Stay: [place], NP  09/04/2022, 7:25 AM  Advanced Heart Failure Team Pager [phone] (M-F; 7a - 5p)  Please contact [facility] for night-coverage after hours (5p -7a ) and weekends on [email or web address]

## 2022-09-04 NOTE — TOC Initial Note (Signed)
Transition of Care [facility]) - Initial/Assessment Note    Patient Details  Name: Tracy Nelson MRN: [id number] Date of Birth: 1945/12/28  Transition of Care [facility]) CM/SW Contact:    Erenest Rasher, RN Phone Number: [phone] 09/04/2022, 4:37 PM  Clinical Narrative:                  HF TOC CM spoke to pt's dtr, and states pt has needed DME in the home, Rollator, RW, wheelchair, and bedside commode. States she aware of possible inotrope at dc. Pt is active with [facility] RN, PT and OT. Contacted [employer] rep, Pam for possible referral for home inotrope. Will continue to follow for dc needs.     Expected Discharge Plan: [place] Barriers to Discharge: Continued Medical Work up   Patient Goals and CMS Choice Patient states their goals for this hospitalization and ongoing recovery are:: return home   Choice offered to / list presented to : NA      Expected Discharge Plan and Services In-house Referral: NA Discharge Planning Services: CM Consult Post Acute Care Choice: NA Living arrangements for the past 2 months: Single Family Home                 DME Arranged: N/A DME Agency: NA       HH Arranged: RN, PT, OT HH Agency: [place], [employer] Date [place]: 09/04/22 Time [place]: 1627 Representative spoke with at [place]: Otila Kluver  Prior Living Arrangements/Services Living arrangements for the past 2 months: [place] with:: Adult Children Patient language and need for interpreter reviewed:: Yes Do you feel safe going back to the place where you live?: Yes      Need for Family Participation in Patient Care: No (Comment) Care giver support system in place?: Yes (comment) Current home services: DME, Home PT, Home RN, Home OT Criminal Activity/Legal Involvement Pertinent to Current Situation/Hospitalization: No - Comment as needed  Activities of Daily Living Home Assistive  Devices/Equipment: Walker (specify type) ADL Screening (condition at time of admission) Patient's cognitive ability adequate to safely complete daily activities?: No Is the patient deaf or have difficulty hearing?: No Does the patient have difficulty seeing, even when wearing glasses/contacts?: No Does the patient have difficulty concentrating, remembering, or making decisions?: No Patient able to express need for assistance with ADLs?: Yes Does the patient have difficulty dressing or bathing?: No Independently performs ADLs?: Yes (appropriate for developmental age) Does the patient have difficulty walking or climbing stairs?: No Weakness of Legs: Left Weakness of Arms/Hands: None  Permission Sought/Granted Permission sought to share information with : Case Manager, Family Supports, PCP Permission granted to share information with : Yes, Verbal Permission Granted  Share Information with NAME: [name] Mansanto     Permission granted to share info w Relationship: daughter  Permission granted to share info w Contact Information: [phone]  Emotional Assessment   Attitude/Demeanor/Rapport: Engaged Affect (typically observed): Appropriate Orientation: : Oriented to Self, Oriented to Place, Oriented to  Time, Oriented to Situation Alcohol / Substance Use: Not Applicable Psych Involvement: No (comment)  Admission diagnosis:  Positive D dimer [R79.89] Acute on chronic HFrEF (heart failure with reduced ejection fraction) (HCC) [I50.23] Congestive heart failure, unspecified HF chronicity, unspecified heart failure type [facility]) [I50.9] Patient Active Problem List   Diagnosis Date Noted   Congestive heart failure ([name]) 09/03/2022   Pressure ulcer 09/03/2022   Acute on  chronic HFrEF (heart failure with reduced ejection fraction) ([name]) 08/30/2022   Elevated d-dimer 08/30/2022   Anemia of chronic disease 08/30/2022   Paroxysmal atrial fibrillation ([place]) 70/26/3785   Chronic systolic CHF  (congestive heart failure) ([place]) 07/13/2022   CKD (chronic kidney disease), stage IV ([place]) 07/09/2022   Displaced intertrochanteric fracture of left femur, initial encounter for closed fracture ([place]) 07/08/2022   Corneal melt, 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   Monoclonal gammopathy 03/10/2021   Hypotension 10/22/2020   Atypical chest pain 10/22/2020   CKD (chronic kidney disease), stage III (HCC) 10/22/2020   Nausea & vomiting 10/22/2020   Physical deconditioning 10/22/2020   Atrial flutter ([place]) 10/18/2020   Arthritis 07/15/2020   GERD (gastroesophageal reflux disease) 07/15/2020   Colitis 07/15/2020   Non-ischemic cardiomyopathy ([place]) 07/15/2020   HFrEF (heart failure with reduced ejection fraction) ([place]) 07/15/2020   HTN (hypertension) 07/15/2020   Osteoporosis 07/15/2020   Palpitations 07/15/2020   Acute gout of left shoulder 07/10/2020   Rheumatoid arthritis involving multiple sites with positive rheumatoid factor ([place]) 10/28/2015   Mitral regurgitation 05/30/2014   Acute on chronic combined systolic (congestive) and diastolic (congestive) heart failure ([place]) 05/19/2014   Vitamin D deficiency 05/15/2013   Encounter for long-term (current) use of other medications 12/14/2008   Sjogren's syndrome ([place]) 12/14/2008   PCP:  [name], [place] Pharmacy:   [id number] [place] [address] [place]Phone: [phone]: [phone]    Social Determinants of Health (SDOH) Social History: SDOH Screenings   Food Insecurity: No Food Insecurity (08/31/2022)  Housing: Low Risk  (08/31/2022)  Transportation Needs: No Transportation Needs (08/31/2022)  Recent Concern: Transportation Needs - Unmet Transportation Needs (07/09/2022)  Utilities: Not At Risk (08/31/2022)  Depression (PHQ2-9): Low Risk  (08/27/2022)  Financial Resource Strain: Low Risk  (04/30/2022)  Physical Activity: Inactive (04/30/2022)  Stress: No Stress Concern Present  (04/30/2022)  Tobacco Use: Low Risk  (09/03/2022)   SDOH Interventions:     Readmission Risk Interventions    08/31/2022   12:35 PM  Readmission Risk Prevention Plan  Transportation Screening Complete  HRI or Home Care Consult Complete  Palliative Care Screening Not Applicable  Medication Review (RN Care Manager) Complete

## 2022-09-04 NOTE — Progress Notes (Signed)
Physical Therapy Treatment Patient Details Name: Tracy Nelson MRN: [id number] DOB: Jun 23, 1946 Today's Date: 09/04/2022   History of Present Illness 77 y/o female who presents on 1/21 with cough, SOB, weakness. CXR- small bilateral pleural effusions. Admitted with acute on chronic HF with possible PE started on Heparin drip. PMH includes HF, nonischemic cardiomyopathy, RA, CKD IV, recent IM nail hip 07/10/22, HTN.    PT Comments    Pt was seen for mobility on RW on hallway, mild fatigue and SOB with short standing rests x 2 taken.  Pt is finally back to room and comfortable in bed with HR and sats controlled.  Pt is expecting to dc to home and has all equipment needs outlined with what she already has at home.  Follow acutely for all mobility needs, and will continue to progress strengthening and balance as needed.   Recommendations for follow up therapy are one component of a multi-disciplinary discharge planning process, led by the attending physician.  Recommendations may be updated based on patient status, additional functional criteria and insurance authorization.  Follow Up Recommendations  Home health PT     Assistance Recommended at Discharge Frequent or constant Supervision/Assistance  Patient can return home with the following A little help with walking and/or transfers;A little help with bathing/dressing/bathroom;Assistance with cooking/housework;Assist for transportation;Help with stairs or ramp for entrance   Equipment Recommendations  None recommended by PT    Recommendations for Other Services       Precautions / Restrictions Precautions Precautions: Fall Precaution Comments: monitor HR and sats Restrictions Weight Bearing Restrictions: No LLE Weight Bearing: Weight bearing as tolerated     Mobility  Bed Mobility Overal bed mobility: Modified Independent                  Transfers Overall transfer level: Needs assistance Equipment used: Rolling walker  (2 wheels) Transfers: Sit to/from Stand Sit to Stand: Min guard                Ambulation/Gait Ambulation/Gait assistance: Min guard, Supervision Gait Distance (Feet): 80 Feet Assistive device: Rolling walker (2 wheels) Gait Pattern/deviations: Step-to pattern, Decreased stride length, Wide base of support, Decreased weight shift to left Gait velocity: reduced Gait velocity interpretation: <1.31 ft/sec, indicative of household ambulator Pre-gait activities: standing balance ck General Gait Details: slow pace with two short standing rests   Stairs             Wheelchair Mobility    Modified Rankin (Stroke Patients Only)       Balance Overall balance assessment: Needs assistance Sitting-balance support: Feet supported Sitting balance-Leahy Scale: Good     Standing balance support: Bilateral upper extremity supported, During functional activity Standing balance-Leahy Scale: Fair                              Cognition Arousal/Alertness: Awake/alert Behavior During Therapy: WFL for tasks assessed/performed Overall Cognitive Status: Within Functional Limits for tasks assessed                                          Exercises      General Comments General comments (skin integrity, edema, etc.): Pt became mildly SOB with sats and HR controlled on walk, but with return to room was 98% and HR was 84      Pertinent Vitals/Pain  Pain Assessment Pain Assessment: Faces Faces Pain Scale: Hurts a little bit Pain Location: L hip Pain Descriptors / Indicators: Grimacing, Guarding Pain Intervention(s): Limited activity within patient's tolerance, Monitored during session, Repositioned    Home Living                          Prior Function            PT Goals (current goals can now be found in the care plan section) Acute Rehab PT Goals Patient Stated Goal: to return home    Frequency    Min 3X/week      PT  Plan Current plan remains appropriate    Co-evaluation              AM-PAC PT "6 Clicks" Mobility   Outcome Measure  Help needed turning from your back to your side while in a flat bed without using bedrails?: None Help needed moving from lying on your back to sitting on the side of a flat bed without using bedrails?: A Little Help needed moving to and from a bed to a chair (including a wheelchair)?: A Little Help needed standing up from a chair using your arms (e.g., wheelchair or bedside chair)?: A Little Help needed to walk in hospital room?: A Little Help needed climbing 3-5 steps with a railing? : A Lot 6 Click Score: 18    End of Session Equipment Utilized During Treatment: Gait belt Activity Tolerance: Patient limited by fatigue Patient left: in bed;with call bell/phone within reach Nurse Communication: Mobility status PT Visit Diagnosis: Muscle weakness (generalized) (M62.81);Difficulty in walking, not elsewhere classified (R26.2)     Time: 1884-1660 PT Time Calculation (min) (ACUTE ONLY): 25 min  Charges:  $Gait Training: 8-22 mins $Therapeutic Activity: 8-22 mins         Ramond Dial 09/04/2022, 4:43 PM  Mee Hives, PT PhD Acute Rehab Dept. Number: [place]

## 2022-09-04 NOTE — Progress Notes (Signed)
Progress Note   Patient: Tracy Nelson YJW:[id number] DOB: 09/09/1945 DOA: 08/30/2022     5 DOS: the patient was seen and examined on 09/04/2022   Brief hospital course: BRYANNE RIQUELME was admitted to the hospital with the working diagnosis of heart failure decompensation.   77 y.o. 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  Patient reports 2 days of dyspnea, orthopnea, PND, and cough. Her symptoms were persistent despite use of oral diuretic therapy at home. On her initial physical examination her blood pressure was 120/92, HR 98, RR 21 and 02 saturation 100%, Lungs with decreased breath sounds, no rales or wheezing, heart with S1 and S2 present and rhythmic, abdomen with no distention, no lower extremity edema.     Assessment and Plan: * Acute on chronic HFrEF (heart failure with reduced ejection fraction) (HCC) Echocardiogram with reduced LV systolic function <03%, with global hypokinesis, severe reduction in RV systolic function, moderate MR, moderate to severe TR. Mild to moderate AI, Moderate pulmonic valve regurgitation. 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   01/25 right heart catheterization.  PA 48/14 with mean 29 PCWP 17  PVR 2.8 wu Cardiac output is 4.2 and index 2,8 (fick).   Pre and post capillary pulmonary hypertension.    Urine output is 2,200 over last 24 hrs. Systolic blood pressure 92 to 94 mmHg.  SV02 66.2  Decreasing rate of milrinone.  Continue diuresis with torsemide. Patient with advance disease and poor prognosis.    Elevated d-dimer Due to recent surgery and elevated D-dimer of 1.95 patient was started on IV heparin in the ED due to concern for possible PE  despite chronic anticoagulation with Eliquis.  CTA chest not able to be performed due to CKD stage IV. -Can continue IV heparin for now -Follow-up VQ scan, if negative transition back to Eliquis  CKD (chronic kidney disease), stage IV (HCC) Chronic metabolic acidosis  Renal function today with serum cr at 3.1 with K at 4,1 and serum bicarbonate at 22.   Continue with milrinone and torsemide. Follow up renal function and electrolytes in am.   Paroxysmal atrial fibrillation (HCC) Continue rate control with amiodarone and anticoagulation with apixaban.   Anemia of chronic disease Chronic anemia due to CKD and iron deficiency.  Hemoglobin slightly below recent baseline.  No obvious bleeding.  Continue to monitor.  Rheumatoid arthritis involving multiple sites with positive rheumatoid factor (HCC) On chronic immunosuppressants.  Continue prednisone, Plaquenil, Imuran.  Pressure ulcer Per 08/31/22 WOC note "Deep tissue pressure injury to right posterior heel.  Present on admission        Subjective: Patient continue to be very weak and deconditioned, persistent cough in the morning.   Physical Exam: Vitals:   09/04/22 0700 09/04/22 0800 09/04/22 0810 09/04/22 1207  BP: 107/62 (!) 113/96  94/77  Pulse: 100 99 (!) 102 96  Resp: (!) '23 19 15 '$ (!) 25  Temp:   98.8 F (37.1 C) 98.4 F (36.9 C)  TempSrc:   Oral Oral  SpO2: 95% 99% 99% 96%  Weight:      Height:       Neurology awake and alert ENT with pallor but no icterus Cardiovascular with S1 and S2 present regular with no gallops, rubs or murmurs Mild  JVD No lower extremity edema Respiratory with no wheezing or rhonchi, mild rales at the lower lobes Abdomen with mild distention, no ascites.  Data Reviewed:    Family Communication: no family at the bedside   Disposition: Status is: Inpatient Remains inpatient appropriate because: IV inotropic support.   Planned Discharge Destination: Home    Author: Tawni Millers, MD 09/04/2022 12:44 PM  For on call review [email or web address].

## 2022-09-04 NOTE — Care Management Important Message (Signed)
Important Message  Patient Details  Name: Tracy Nelson MRN: [id number] Date of Birth: 1946/03/30   Medicare Important Message Given:  Yes     Shelda Altes 09/04/2022, 8:50 AM

## 2022-09-05 DIAGNOSIS — D638 Anemia in other chronic diseases classified elsewhere: Secondary | ICD-10-CM | POA: Diagnosis not present

## 2022-09-05 DIAGNOSIS — I480 Paroxysmal atrial fibrillation: Secondary | ICD-10-CM | POA: Diagnosis not present

## 2022-09-05 DIAGNOSIS — I5023 Acute on chronic systolic (congestive) heart failure: Secondary | ICD-10-CM | POA: Diagnosis not present

## 2022-09-05 DIAGNOSIS — N184 Chronic kidney disease, stage 4 (severe): Secondary | ICD-10-CM | POA: Diagnosis not present

## 2022-09-05 LAB — COOXEMETRY PANEL
Carboxyhemoglobin: 1.6 % — ABNORMAL HIGH (ref 0.5–1.5)
Carboxyhemoglobin: 1.9 % — ABNORMAL HIGH (ref 0.5–1.5)
Methemoglobin: <0.7 % (ref 0.0–1.5)
Methemoglobin: <0.7 % (ref 0.0–1.5)
O2 Saturation: 62.9 %
O2 Saturation: 56.3 %
Total hemoglobin: 9.1 g/dL — ABNORMAL LOW (ref 12.0–16.0)
Total hemoglobin: 9.3 g/dL — ABNORMAL LOW (ref 12.0–16.0)

## 2022-09-05 LAB — BASIC METABOLIC PANEL
Anion gap: 10 (ref 5–15)
BUN: 64 mg/dL — ABNORMAL HIGH (ref 8–23)
CO2: 24 mmol/L (ref 22–32)
Calcium: 8.8 mg/dL — ABNORMAL LOW (ref 8.9–10.3)
Chloride: 100 mmol/L (ref 98–111)
Creatinine, Ser: 3.37 mg/dL — ABNORMAL HIGH (ref 0.44–1.00)
GFR, Estimated: 14 mL/min — ABNORMAL LOW (ref >60)
Glucose, Bld: 126 mg/dL — ABNORMAL HIGH (ref 70–99)
Potassium: 3.8 mmol/L (ref 3.5–5.1)
Sodium: 134 mmol/L — ABNORMAL LOW (ref 135–145)

## 2022-09-05 LAB — MAGNESIUM: Magnesium: 2 mg/dL (ref 1.7–2.4)

## 2022-09-05 NOTE — Progress Notes (Signed)
Advanced Heart Failure Rounding Note  PCP-Cardiologist: None   Subjective:   RHC yesterday on milrinone .25 Hemodynamics RA mean 10 RV 41/13 PA 48/14 mean 29 PCWP mean 17 Oxygen saturations: PA 54% AO 97% Cardiac Output (Fick) 4.22  Cardiac Index (Fick) 2.81 PVR 2.8 WU PAPi 3.4   Milrinone down to 0.155mg/kg/min, mixed venous 56.  CVP 5-6.   Objective:   Weight Range: 47.4 kg Body mass index is 18.51 kg/m.   Vital Signs:   Temp:  [97.6 F (36.4 C)-98.4 F (36.9 C)] 97.7 F (36.5 C) (01/27 1100) Pulse Rate:  [57-96] 57 (01/27 1100) Resp:  [17-23] 20 (01/27 1100) BP: (84-118)/(34-77) 98/72 (01/27 1100) SpO2:  [97 %-98 %] 98 % (01/27 1100) Weight:  [47.4 kg] 47.4 kg (01/27 0000) Last BM Date : 08/31/22  Weight change: Filed Weights   09/03/22 0308 09/04/22 0500 09/05/22 0000  Weight: 49.2 kg 47.9 kg 47.4 kg    Intake/Output:   Intake/Output Summary (Last 24 hours) at 09/05/2022 1257 Last data filed at 09/05/2022 [id number] per 24 hour  Intake 144.14 ml  Output 1400 ml  Net -1255.86 ml       Physical Exam   CVP 5-6 General:  chronically ill appearing.  No respiratory difficulty HEENT: normal Neck: supple. JVD ~6 cm. Carotids 2+ bilat; no bruits. No lymphadenopathy or thyromegaly appreciated. Cor: PMI nondisplaced. Regular rate & rhythm. No rubs, gallops or murmurs. Lungs: clear, diminished bases Abdomen: soft, nontender, nondistended. No hepatosplenomegaly. No bruits or masses. Good bowel sounds. Extremities: no cyanosis, clubbing, rash, edema. PICC RUE Neuro: alert & oriented x 3, cranial nerves grossly intact. moves all 4 extremities w/o difficulty. Affect pleasant.   Telemetry   NSR low 100s 2-6 PVCs/hr (Personally reviewed)    EKG    No new EKG to review  Labs    CBC Recent Labs    09/03/22 1609 09/03/22 1610  HGB 9.5* 10.2*  HCT 28.0* 30.0*    Basic Metabolic Panel Recent Labs    09/04/22 0400 09/05/22 0226  NA 135  134*  K 4.1 3.8  CL 104 100  CO2 22 24  GLUCOSE 113* 126*  BUN 55* 64*  CREATININE 3.11* 3.37*  CALCIUM 8.8* 8.8*  MG 2.1 2.0    Liver Function Tests No results for input(s): "AST", "ALT", "ALKPHOS", "BILITOT", "PROT", "ALBUMIN" in the last 72 hours.  No results for input(s): "LIPASE", "AMYLASE" in the last 72 hours. Cardiac Enzymes No results for input(s): "CKTOTAL", "CKMB", "CKMBINDEX", "TROPONINI" in the last 72 hours.  BNP: BNP (last 3 results) Recent Labs    07/07/22 1515 08/27/22 1658 08/30/22 1409  BNP 232.0* >5000.0* >4,500.0*     ProBNP (last 3 results) No results for input(s): "PROBNP" in the last 8760 hours.   D-Dimer No results for input(s): "DDIMER" in the last 72 hours.  Hemoglobin A1C No results for input(s): "HGBA1C" in the last 72 hours. Fasting Lipid Panel No results for input(s): "CHOL", "HDL", "LDLCALC", "TRIG", "CHOLHDL", "LDLDIRECT" in the last 72 hours. Thyroid Function Tests No results for input(s): "TSH", "T4TOTAL", "T3FREE", "THYROIDAB" in the last 72 hours.  Invalid input(s): "FREET3"   Other results:   Imaging    No results found.   Medications:     Scheduled Medications:  amiodarone  400 mg Oral BID   apixaban  2.5 mg Oral BID   azaTHIOprine  100 mg Oral Daily   Chlorhexidine Gluconate Cloth  6 each Topical Daily   folic  acid  1 mg Oral Daily   hydrocerin   Topical Daily   lidocaine  1 patch Transdermal Q24H   pantoprazole  80 mg Oral Daily   predniSONE  5 mg Oral Q breakfast   sodium bicarbonate  650 mg Oral Daily   sodium chloride flush  10-40 mL Intracatheter Q12H   sodium chloride flush  3 mL Intravenous Q12H   sodium chloride flush  3 mL Intravenous Q12H   sodium chloride flush  3 mL Intravenous Q12H   Vericiguat  10 mg Oral Daily    Infusions:  milrinone 0.125 mcg/kg/min (09/05/22 0518)    PRN Medications: acetaminophen **OR** acetaminophen, alum & mag hydroxide-simeth, guaiFENesin-dextromethorphan,  ondansetron **OR** ondansetron (ZOFRAN) IV, senna-docusate, sodium chloride flush    Patient Profile   Tracy Nelson is a 77 y.o. AAF with history of rheumatoid arthritis, atrial fibrillation, CKD stage 3, post-herpetic neuralgia (shingles L upper back 3/23) and nonischemic cardiomyopathy. 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    Assessment/Plan   1. 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   - Echo 07/2022 EF improved 45-50%. RV normal  - RHC 12/21 low filling pressures and relatively preserved cardiac index at 2.22. - Echo 09/01/22 EF < 20% RV severely reduced - NYHA class IIIb/IV on admission - Continue midodrine 5 mg tid.  - Continue Verquvo 10 mg daily.  - Repeat TTE on 09/01/22 with severe biventricular failure - hip repair ?nail precludes her from CMR.  - Suspect she is end-stage. Continue milrinone for now.  - Rise in sCr yesterday likely due to hypovolemia; will hold torsemide for tomorrow. CVP 4-5 this AM.  - Will attempt to wean milrinone; currently at 0.133mg/kg/min with mixed venous 56. Nursing staff conveyed their concerning regarding home milrinone today; patient very frail and having difficulty ambulating.  2. AKI on CKD IV - She is followed by Dr. FRoyce Macadamia - Baseline 2.4-2.6 - sCr 3.37 today; hold torsemide.    3. Rheumatoid arthritis: No history of lung involvement. On low dose of prednisone chronically.    4. Atrial fibrillation/flutter: S/p TEE-DCCV in 4/22.  She is now on amiodarone and in NSR.  - Continue amiodarone 200 mg daily. LFTs and TSH normal (11/23). - Eliquis    5. Shingles: Resolved.    6. Left Hip Fracture - S/P IM Nail 07/2022 - PT/OT   7. Swollen lymph nodes - primarily L side - white patches seen in back of throat - discussed with primary - strep (-)   8. Microcytic anemia - 2/2 plaquenil? - per primary - Hgb 7 on admission. 9 -> 8.1 most recently s/p  1uPRBc 09/02/22  Length of Stay: 6Anna DO  09/05/2022, 12:57 PM  Advanced Heart Failure Team Pager [phone](M-F; 7a - 5p)  Please contact [email or web address] for night-coverage after hours (5p -7a ) and weekends on [email or web address]

## 2022-09-05 NOTE — Progress Notes (Signed)
Mobility Specialist Progress Note    09/05/22 1056  Mobility  Activity Ambulated with assistance in room  Level of Assistance Minimal assist, patient does 75% or more  Assistive Device Front wheel walker  Distance Ambulated (ft) 30 ft (20+10)  LLE Weight Bearing WBAT  Activity Response Tolerated fair  Mobility Referral Yes  $Mobility charge 1 Mobility   Pre-Mobility: 92 HR During Mobility: 104 HR Post-Mobility: 94 HR, 108/71 (83) BP, 98% SpO2  Pt received in bed and agreeable. Pt c/o fatigue. After ~10' stating she needed to turn around. Returned to sitting EOB. Pt then requesting to sit up in chair. Left with call bell in reach.   Hildred Alamin Mobility Specialist  Please Psychologist, sport and exercise or Rehab Office at [phone]

## 2022-09-05 NOTE — Progress Notes (Signed)
Progress Note   Patient: Tracy Nelson YIR:[id number] DOB: 22-Mar-1946 DOA: 08/30/2022     6 DOS: the patient was seen and examined on 09/05/2022   Brief hospital course: EMALENE WELTE was admitted to the hospital with the working diagnosis of heart failure decompensation.   77 y.o. 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  Patient reports 2 days of dyspnea, orthopnea, PND, and cough. Her symptoms were persistent despite use of oral diuretic therapy at home. On her initial physical examination her blood pressure was 120/92, HR 98, RR 21 and 02 saturation 100%, Lungs with decreased breath sounds, no rales or wheezing, heart with S1 and S2 present and rhythmic, abdomen with no distention, no lower extremity edema.   Na 140, K 4,0 CL 111, bicarbonate 16 glucose 101 bun 46 cr 2,89 BNP >4,500 High sensitive troponin 51 and 52 Wbc 9.2 hgb 7.3 plt 275  D dimer 1.95  Sars covid 19 and influenza negative   Chest radiograph with cardiomegaly with bilateral hilar vascular congestion with small bilateral pleura effusions.   EKG 100 bpm, left axis deviation, normal intervals, sinus rhythm with poor R R wave progression, no ST segment changes, negative T wave lead I and AvL.   Patient was placed on IV furosemide and IV heparin V/Q scan negative for pulmonary embolism.   01/22 PRBC transfusion x1 01/23 low output heart failure, started on milrinone infusion, 0.25 mcg/kg/min. 01/25 right heart catheterization with mild elevation of right and left heart filling pressures with mild venous hypertension, good cardiac output on milrinone.  01/26 milrinone is down  0.125 mcg/kg/min 01/27 patient with hypotensive and hypoperfusion on lower dose of milrinone drip. Poor prognosis, end stage  heart failure.   Assessment and Plan: * Acute on chronic HFrEF (heart failure with reduced ejection fraction) (HCC) Echocardiogram with reduced LV systolic function <50%, with global hypokinesis, severe reduction in RV systolic function, moderate MR, moderate to severe TR. Mild to moderate AI, Moderate pulmonic valve regurgitation. 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   01/25 right heart catheterization.  PA 48/14 with mean 29 PCWP 17  PVR 2.8 wu Cardiac output is 4.2 and index 2,8 (fick).   Pre and post capillary pulmonary hypertension.    Urine output is 1.400 over last 24 hrs. Systolic blood pressure 95 to 97 mmHg.  SV02 56.3  Continue with IV milrinone (may need palliative home inotropic support vs hospice).  Holding torsemide for now.  Patient with advance disease and poor prognosis.    Elevated d-dimer Due to recent surgery and elevated D-dimer of 1.95 patient was started on IV heparin in the ED due to concern for possible PE despite chronic anticoagulation with Eliquis.  CTA chest not able to be performed due to CKD stage IV. -Can continue IV heparin for now -Follow-up VQ scan, if negative transition back to Eliquis  CKD (chronic kidney disease), stage IV (HCC) AKI on CKD Chronic metabolic acidosis, hyponatremia.   Renal function today with serum cr at 3,37 with K at 3,8 and serum bicarbonate at 24.  Na 134 Mg 2,0   Continue with milrinone  Follow up renal function and electrolytes in am.  Poor prognosis.   Paroxysmal atrial fibrillation (HCC) Continue rate control with amiodarone and anticoagulation  with apixaban.   Anemia of chronic disease Chronic anemia due to CKD and iron deficiency.  Hemoglobin slightly below recent baseline.  No obvious bleeding.  Continue to monitor.  Rheumatoid arthritis involving multiple sites with positive rheumatoid factor (HCC) On chronic  immunosuppressants.  Continue prednisone, Plaquenil, Imuran.  Pressure ulcer Per 08/31/22 WOC note "Deep tissue pressure injury to right posterior heel.  Present on admission        Subjective: Patient continue very weak and deconditioned, dyspnea on exertion, no chest pain, no edema   Physical Exam: Vitals:   09/05/22 0000 09/05/22 0300 09/05/22 0732 09/05/22 1100  BP: (!) 84/34 (!) 107/42 103/77 98/72  Pulse: 96 96 91 (!) 57  Resp: '20 19 17 20  '$ Temp: 98.4 F (36.9 C) 98.4 F (36.9 C) 97.6 F (36.4 C) 97.7 F (36.5 C)  TempSrc: Oral Oral Oral Oral  SpO2:   97% 98%  Weight: 47.4 kg     Height:       Neurology awake and alert ENT With positive pallor Cardiovascular with S1 and S2 present and rhythmic with no gallops, rubs or murmurs Respiratory with mild rales at bases with no wheezing Abdomen with no distention  No lower extremity edema  Data Reviewed:    Family Communication: no family at the bedside   Disposition: Status is: Inpatient Remains inpatient appropriate because: on inotropic support   Planned Discharge Destination: Home      Author: Tawni Millers, MD 09/05/2022 1:29 PM  For on call review [email or web address].

## 2022-09-06 DIAGNOSIS — D638 Anemia in other chronic diseases classified elsewhere: Secondary | ICD-10-CM | POA: Diagnosis not present

## 2022-09-06 DIAGNOSIS — Z515 Encounter for palliative care: Secondary | ICD-10-CM | POA: Diagnosis not present

## 2022-09-06 DIAGNOSIS — Z7189 Other specified counseling: Secondary | ICD-10-CM

## 2022-09-06 DIAGNOSIS — I480 Paroxysmal atrial fibrillation: Secondary | ICD-10-CM | POA: Diagnosis not present

## 2022-09-06 DIAGNOSIS — I5023 Acute on chronic systolic (congestive) heart failure: Secondary | ICD-10-CM | POA: Diagnosis not present

## 2022-09-06 DIAGNOSIS — N184 Chronic kidney disease, stage 4 (severe): Secondary | ICD-10-CM | POA: Diagnosis not present

## 2022-09-06 LAB — BASIC METABOLIC PANEL
Anion gap: 10 (ref 5–15)
BUN: 64 mg/dL — ABNORMAL HIGH (ref 8–23)
CO2: 23 mmol/L (ref 22–32)
Calcium: 8.4 mg/dL — ABNORMAL LOW (ref 8.9–10.3)
Chloride: 103 mmol/L (ref 98–111)
Creatinine, Ser: 3.13 mg/dL — ABNORMAL HIGH (ref 0.44–1.00)
GFR, Estimated: 15 mL/min — ABNORMAL LOW (ref >60)
Glucose, Bld: 126 mg/dL — ABNORMAL HIGH (ref 70–99)
Potassium: 3.3 mmol/L — ABNORMAL LOW (ref 3.5–5.1)
Sodium: 136 mmol/L (ref 135–145)

## 2022-09-06 LAB — COOXEMETRY PANEL
Carboxyhemoglobin: 1.6 % — ABNORMAL HIGH (ref 0.5–1.5)
Methemoglobin: <0.7 % (ref 0.0–1.5)
O2 Saturation: 58.9 %
Total hemoglobin: 8.7 g/dL — ABNORMAL LOW (ref 12.0–16.0)

## 2022-09-06 LAB — MAGNESIUM: Magnesium: 1.9 mg/dL (ref 1.7–2.4)

## 2022-09-06 MED ORDER — POTASSIUM CHLORIDE CRYS ER 20 MEQ PO TBCR
40.0000 meq | EXTENDED_RELEASE_TABLET | Freq: Once | ORAL | Status: AC
  Administered 2022-09-06: 40 meq via ORAL
  Filled 2022-09-06: qty 2

## 2022-09-06 NOTE — Progress Notes (Signed)
Advanced Heart Failure Rounding Note  PCP-Cardiologist: None   Subjective:   RHC yesterday on milrinone .25 Hemodynamics RA mean 10 RV 41/13 PA 48/14 mean 29 PCWP mean 17 Oxygen saturations: PA 54% AO 97% Cardiac Output (Fick) 4.22  Cardiac Index (Fick) 2.81 PVR 2.8 WU PAPi 3.4   - Milrinone at 0.171mg/kg/min, mixed venous stable - CVP 3  Objective:   Weight Range: 49.4 kg Body mass index is 19.29 kg/m.   Vital Signs:   Temp:  [97.9 F (36.6 C)-98.6 F (37 C)] 98.6 F (37 C) (01/28 1142) Pulse Rate:  [87-92] 88 (01/28 1142) Resp:  [10-20] 17 (01/28 1142) BP: (91-112)/(69-74) 97/74 (01/28 1142) SpO2:  [93 %-98 %] 98 % (01/28 1142) Weight:  [49.4 kg] 49.4 kg (01/28 0500) Last BM Date : 09/06/22  Weight change: Filed Weights   09/05/22 0000 09/06/22 0000 09/06/22 0500  Weight: 47.4 kg 49.4 kg 49.4 kg    Intake/Output:   Intake/Output Summary (Last 24 hours) at 09/06/2022 1201 Last data filed at 09/06/2022 0831 Gross per 24 hour  Intake 360 ml  Output 1300 ml  Net -940 ml       Physical Exam   CVP 2-4 General:  chronically ill appearing.  No respiratory difficulty HEENT: normal Neck: supple. JVD 5cm. Carotids 2+ bilat; no bruits. No lymphadenopathy or thyromegaly appreciated. Cor: PMI nondisplaced. Regular rate & rhythm. No rubs, gallops or murmurs. Lungs: clear, diminished bases Abdomen: soft, nontender, nondistended. No hepatosplenomegaly. No bruits or masses. Good bowel sounds. Extremities: no cyanosis, clubbing, rash, edema. PICC RUE Neuro: alert & oriented x 3, cranial nerves grossly intact. moves all 4 extremities w/o difficulty. Affect pleasant.   Telemetry   NSR low 100s 2-6 PVCs/hr (Personally reviewed)    EKG    No new EKG to review  Labs    CBC Recent Labs    09/03/22 1609 09/03/22 1610  HGB 9.5* 10.2*  HCT 28.0* 30.0*    Basic Metabolic Panel Recent Labs    09/05/22 0226 09/06/22 0516  NA 134* 136  K 3.8 3.3*   CL 100 103  CO2 24 23  GLUCOSE 126* 126*  BUN 64* 64*  CREATININE 3.37* 3.13*  CALCIUM 8.8* 8.4*  MG 2.0 1.9    Liver Function Tests No results for input(s): "AST", "ALT", "ALKPHOS", "BILITOT", "PROT", "ALBUMIN" in the last 72 hours.  No results for input(s): "LIPASE", "AMYLASE" in the last 72 hours. Cardiac Enzymes No results for input(s): "CKTOTAL", "CKMB", "CKMBINDEX", "TROPONINI" in the last 72 hours.  BNP: BNP (last 3 results) Recent Labs    07/07/22 1515 08/27/22 1658 08/30/22 1409  BNP 232.0* >5000.0* >4,500.0*     Imaging    No results found.   Medications:     Scheduled Medications:  amiodarone  400 mg Oral BID   apixaban  2.5 mg Oral BID   azaTHIOprine  100 mg Oral Daily   Chlorhexidine Gluconate Cloth  6 each Topical Daily   folic acid  1 mg Oral Daily   hydrocerin   Topical Daily   lidocaine  1 patch Transdermal Q24H   pantoprazole  80 mg Oral Daily   predniSONE  5 mg Oral Q breakfast   sodium bicarbonate  650 mg Oral Daily   sodium chloride flush  10-40 mL Intracatheter Q12H   sodium chloride flush  3 mL Intravenous Q12H   sodium chloride flush  3 mL Intravenous Q12H   sodium chloride flush  3 mL Intravenous  Q12H   Vericiguat  10 mg Oral Daily    Infusions:  milrinone 0.125 mcg/kg/min (09/05/22 0518)    PRN Medications: acetaminophen **OR** acetaminophen, alum & mag hydroxide-simeth, guaiFENesin-dextromethorphan, ondansetron **OR** ondansetron (ZOFRAN) IV, senna-docusate, sodium chloride flush    Patient Profile   Tracy Nelson is a 77 y.o. AAF with history of rheumatoid arthritis, atrial fibrillation, CKD stage 3, post-herpetic neuralgia (shingles L upper back 3/23) and nonischemic cardiomyopathy. 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    Assessment/Plan   1. 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   - Echo 07/2022 EF improved 45-50%. RV normal  -  RHC 12/21 low filling pressures and relatively preserved cardiac index at 2.22. - Echo 09/01/22 EF < 20% RV severely reduced - NYHA class IIIb/IV on admission - Continue midodrine 5 mg tid.  - Continue Verquvo 10 mg daily.  - Repeat TTE on 09/01/22 with severe biventricular failure - hip repair ?nail precludes her from CMR.  - Suspect she is end-stage. Continue milrinone for now.  - Improving sCr. D/C milrinone tomorrow.  - CVP 2-4, will hold off on diuretics.  - Very lengthy discussion with patient and her sisters today regarding long term prognosis & options.   2. AKI on CKD IV - She is followed by Dr. Royce Macadamia. - Baseline 2.4-2.6 - sCr 3.1 today; hold diuretics.   3. Rheumatoid arthritis: No history of lung involvement. On low dose of prednisone chronically.    4. Atrial fibrillation/flutter: S/p TEE-DCCV in 4/22.  She is now on amiodarone and in NSR.  - Continue amiodarone 200 mg daily. LFTs and TSH normal (11/23). - Eliquis    5. Shingles: Resolved.    6. Left Hip Fracture - S/P IM Nail 07/2022 - PT/OT   7. Swollen lymph nodes - primarily L side - white patches seen in back of throat - discussed with primary - strep (-)   8. Microcytic anemia - 2/2 plaquenil? - per primary - Hgb 7 on admission. s/p 1uPRBc 09/02/22  Length of Stay: [place], DO  09/06/2022, 12:01 PM  Advanced Heart Failure Team Pager [phone] (M-F; 7a - 5p)  Please contact [facility] for night-coverage after hours (5p -7a ) and weekends on [email or web address]

## 2022-09-06 NOTE — Consult Note (Signed)
Consultation Note Date: 09/06/2022   Patient Name: Tracy Nelson  DOB: Jun 11, 1946  MRN: [id number]  Age / Sex: 77 y.o., female  PCP: Tracy Nelson, [place] Referring Physician: Tawni Nelson  Reason for Consultation: Establishing goals of care and Psychosocial/spiritual support  HPI/Patient Profile: 77 y.o. female   admitted on 08/30/2022 with past  medical history significant for HFrEF (EF improved to 45-50% 07/11/2022), CKD stage IV, paroxysmal A-fib/flutter on Eliquis, anemia of chronic disease, rheumatoid arthritis on chronic immunosuppressants, left hip fracture s/p IM nail 07/10/2022.   Patient reports 2 days of dyspnea, orthopnea, PND, and cough. Her symptoms were persistent despite use of oral diuretic therapy at home.    Chest radiograph with cardiomegaly with bilateral hilar vascular congestion with small bilateral pleura effusions.    End stage heart failure, CKD.    Patient faces treatment option decisions, advanced directive decisions and anticipatory care needs.    Clinical Assessment and Goals of Care:   This NP Tracy Nelson reviewed medical records, received report from team, assessed the patient and then meet at the patient's bedside along with her sister/ Tracy Nelson to discuss diagnosis, prognosis, GOC,  disposition and options.   Concept of Palliative Care was introduced as specialized medical care for people and their families living with serious illness.  If focuses on providing relief from the symptoms and stress of a serious illness.  The goal is to improve quality of life for both the patient and the family.   Values and goals of care important to patient and family were attempted to be elicited.  Initially patient and family were reluctant to talk with me as they understood palliative medicine to be "hospice".  Education offered on the difference between palliative medicine and  hospice care.  Education offered on hospice benefit; philosophy and eligibility. Patient and her family verbalized fear and trepidation regarding the concept of hospice.  Education offered specifically to her multiple co-morbidities; acute on chronic CHF, chronic kidney disease , nonischemic cardiomyopathy, and her recovery status post left hip fracture repair.    A  discussion was had today regarding advanced directives.  Concepts specific to code status, artifical feeding and hydration, continued IV antibiotics and rehospitalization was had.    The difference between a aggressive medical intervention path  and a palliative comfort care path for this patient at this time was had.    Education offered to patient on the importance of continued conversation with her family and her medical providers ensuring patient centered care.    I spoke with her daughter Tracy Nelson by telephone for ongoing education regarding current medical situation..  She is out of town and will return on Tuesday is requesting a call from heart failure docs at that time.    Questions and concerns addressed.  Patient  encouraged to call with questions or concerns.     PMT will continue to support holistically.     No documented healthcare power of attorney or of advance care planning documents noted.  Patient tells me  that her daughter/ Tracy Nelson if her main support person and decision maker in the event that she cannot speak for herself.       SUMMARY OF RECOMMENDATIONS    Code Status/Advance Care Planning: Full code Educated patient/family to consider DNR/DNI status understanding evidenced based poor outcomes in similar hospitalized patient, as the cause of arrest is likely associated with advanced chronic illness rather than an easily reversible acute cardio-pulmonary event.   Palliative Prophylaxis:  Bowel Regimen, Delirium Protocol, and Frequent Pain Assessment  Additional Recommendations (Limitations,  Scope, Preferences): Full Scope Treatment  Psycho-social/Spiritual:  Desire for further Chaplaincy support:no Additional Recommendations: Education on Hospice  Prognosis:  Unable to determine-   Discharge Planning: To Be Determined      Primary Diagnoses: Present on Admission:  Acute on chronic HFrEF (heart failure with reduced ejection fraction) (HCC)  CKD (chronic kidney disease), stage IV (HCC)  Rheumatoid arthritis involving multiple sites with positive rheumatoid factor (HCC)  Anemia of chronic disease  Paroxysmal atrial fibrillation (HCC)  Pressure ulcer   I have reviewed the medical record, interviewed the patient and family, and examined the patient. The following aspects are pertinent.  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   HFrEF (heart failure with reduced ejection fraction) ([place]) 07/15/2020   HTN (hypertension) 07/15/2020   Osteoporosis 07/15/2020   Palpitations 07/15/2020   Rheumatoid arthritis ([place]) 07/15/2020   Social History   Socioeconomic History   Marital status: Widowed    Spouse name: Not on file   Number of children: Not on file   Years of education: 16   Highest education level: Bachelor's degree (e.g., BA, AB, BS)  Occupational History   Occupation: Retired  Tobacco Use   Smoking status: Never   Smokeless tobacco: Never  Vaping Use   Vaping Use: Never used  Substance and Sexual Activity   Alcohol use: Never   Drug use: Never   Sexual activity: Not Currently  Other Topics Concern   Not on file  Social History Narrative   Not on file   Social Determinants of Health   Financial Resource Strain: Low Risk  (04/30/2022)   Overall Financial Resource Strain (CARDIA)    Difficulty of Paying Living Expenses: Not hard at all  Food Insecurity: No Food Insecurity (08/31/2022)   Hunger Vital Sign     Worried About Running Out of Food in the Last Year: Never true    Ran Out of Food in the Last Year: Never true  Transportation Needs: No Transportation Needs (08/31/2022)   PRAPARE - Hydrologist (Medical): No    Lack of Transportation (Non-Medical): No  Recent Concern: Transportation Needs - Unmet Transportation Needs (07/09/2022)   PRAPARE - Transportation    Lack of Transportation (Medical): Yes    Lack of Transportation (Non-Medical): Yes  Physical Activity: Inactive (04/30/2022)   Exercise Vital Sign    Days of Exercise per Week: 0 days    Minutes of Exercise per Session: 0 min  Stress: No Stress Concern Present (04/30/2022)   [place]    Feeling of Stress : Not at all  Social Connections: Not on file   Family History  Problem Relation Age of Onset   Hypertension Mother    Diabetes Mother    Hypertension Father  Diabetes Father    Breast cancer Maternal Aunt    Breast cancer Paternal Aunt    Arthritis Maternal Grandmother    Lung disease Paternal Grandfather    Cancer Brother    Scheduled Meds:  amiodarone  400 mg Oral BID   apixaban  2.5 mg Oral BID   azaTHIOprine  100 mg Oral Daily   Chlorhexidine Gluconate Cloth  6 each Topical Daily   folic acid  1 mg Oral Daily   hydrocerin   Topical Daily   lidocaine  1 patch Transdermal Q24H   pantoprazole  80 mg Oral Daily   predniSONE  5 mg Oral Q breakfast   sodium bicarbonate  650 mg Oral Daily   sodium chloride flush  10-40 mL Intracatheter Q12H   sodium chloride flush  3 mL Intravenous Q12H   sodium chloride flush  3 mL Intravenous Q12H   sodium chloride flush  3 mL Intravenous Q12H   Vericiguat  10 mg Oral Daily   Continuous Infusions:  milrinone 0.125 mcg/kg/min (09/05/22 0518)   PRN Meds:.acetaminophen **OR** acetaminophen, alum & mag hydroxide-simeth, guaiFENesin-dextromethorphan, ondansetron **OR** ondansetron  (ZOFRAN) IV, senna-docusate, sodium chloride flush Medications Prior to Admission:  Prior to Admission medications   Medication Sig Start Date End Date Taking? Authorizing Provider  amiodarone (PACERONE) 200 MG tablet TAKE 1 TABLET BY MOUTH EVERY DAY Patient taking differently: Take 200 mg by mouth daily. 12/19/21  Yes [name], MD  azaTHIOprine (IMURAN) 50 MG tablet TAKE 2 TABLETS BY MOUTH EVERY DAY Patient taking differently: Take 100 mg by mouth daily. 04/20/22  Yes Rice, Resa Miner, MD  bumetanide (BUMEX) 1 MG tablet Take 1 mg by mouth daily.   Yes [provider]  calcitRIOL (ROCALTROL) 0.25 MCG capsule Take 0.25 mcg by mouth daily. 06/25/22  Yes [provider]  ELIQUIS 2.5 MG TABS tablet TAKE 1 TABLET BY MOUTH TWICE A DAY Patient taking differently: Take 2.5 mg by mouth 2 (two) times daily. 12/31/21  Yes [name], MD  Ensure (ENSURE) Take 237 mLs by mouth daily as needed (for meal supplement).   Yes [provider]  folic acid (FOLVITE) 1 MG tablet TAKE 1 TABLET BY MOUTH EVERY DAY Patient taking differently: Take 1 mg by mouth daily. 07/22/22  Yes [name], MD  hydroxychloroquine (PLAQUENIL) 200 MG tablet TAKE 1 TABLET BY MOUTH EVERY DAY Patient taking differently: Take 200 mg by mouth daily. 05/29/22  Yes [name], MD  hydroxypropyl methylcellulose / hypromellose (ISOPTO TEARS / GONIOVISC) 2.5 % ophthalmic solution Place 1 drop into both eyes as needed for dry eyes.   Yes [provider]  lidocaine (LIDODERM) 5 % Place 1 patch onto the skin daily. Remove & Discard patch within 12 hours or as directed by MD Patient taking differently: Place 1 patch onto the skin daily as needed (for shoulder pain). Remove & Discard patch within 12 hours or as directed by MD 03/09/22  Yes Tracy Brine, [name]  Menthol, Topical Analgesic, (ICY HOT BACK EX) Apply 1 application topically daily as needed (pain).   Yes [provider]   midodrine (PROAMATINE) 5 MG tablet Take 1 tablet (5 mg total) by mouth 3 (three) times daily with meals. 02/04/22  Yes [name], MD  Multiple Vitamin (MULTIVITAMIN) tablet Take 1 tablet by mouth daily.   Yes [provider]  omeprazole (PRILOSEC) 40 MG capsule Take 40 mg by mouth daily as needed (for acid reflux).  Yes [provider]  polyethylene glycol (MIRALAX / GLYCOLAX) 17 g packet Take 17 g by mouth daily as needed for mild constipation.   Yes [provider]  predniSONE (DELTASONE) 5 MG tablet TAKE 1 TABLET BY MOUTH EVERY DAY WITH BREAKFAST Patient taking differently: Take 5 mg by mouth daily with breakfast. 05/04/22  Yes Rice, Resa Miner, MD  Vericiguat (VERQUVO) 10 MG TABS Take 10 mg by mouth daily. 09/03/21  Yes [name], MD  sodium bicarbonate 650 MG tablet Take 650 mg by mouth daily. Patient not taking: Reported on 08/30/2022 08/26/22   [provider]   Allergies  Allergen Reactions   Baclofen Nausea And Vomiting   Penicillin G     Other reaction(s): Unknown   Penicillins Rash   Review of Systems  Constitutional:  Positive for fatigue.  Respiratory:  Positive for shortness of breath.   Neurological:  Positive for weakness.    Physical Exam Constitutional:      Appearance: She is overweight. She is ill-appearing.     Interventions: Nasal cannula in place.  Skin:    General: Skin is warm and dry.  Neurological:     Mental Status: She is alert.     Vital Signs: BP 112/72 (BP Location: Left Arm)   Pulse 89   Temp 97.9 F (36.6 C) (Oral)   Resp 20   Ht '5\' 3"'$  (1.6 m)   Wt 49.4 kg   SpO2 98%   BMI 19.29 kg/m  Pain Scale: 0-10   Pain Score: 0-No pain   SpO2: SpO2: 98 % O2 Device:SpO2: 98 % O2 Flow Rate: .O2 Flow Rate (L/min): 2 L/min  IO: Intake/output summary:  Intake/Output Summary (Last 24 hours) at 09/06/2022 0856 Last data filed at 09/06/2022 0831 Gross per 24 hour  Intake 360 ml  Output 1300 ml   Net -940 ml    LBM: Last BM Date : 08/31/22 Baseline Weight: Weight: 48 kg Most recent weight: Weight: 49.4 kg     Palliative Assessment/Data: 40 %    Discussed with Dr Cathlean Sauer   Time In: 1300 Time Out: 1430 Time Total: 90 min Greater than 50%  of this time was spent counseling and coordinating care related to the above assessment and plan.  Signed by: Tracy Lessen, NP   Please contact Palliative Medicine Team phone at [phone] for questions and concerns.  For individual provider: See Shea Evans

## 2022-09-06 NOTE — Progress Notes (Signed)
Mobility Specialist Progress Note    09/06/22 1345  Mobility  Activity Ambulated with assistance in hallway  Level of Assistance Minimal assist, patient does 75% or more  Assistive Device Front wheel walker  Distance Ambulated (ft) 45 ft  Activity Response Tolerated fair  Mobility Referral Yes  $Mobility charge 1 Mobility   Pre-Mobility: 92 HR, 99/73 (81) BP, 98% SpO2 During Mobility: 105 HR Post-Mobility: 96 HR, 105/70 (81) BP  Pt received in bed and agreeable. C/o fatigue. Returned to chair with call bell in reach.   Hildred Alamin Mobility Specialist  Please Psychologist, sport and exercise or Rehab Office at [phone]

## 2022-09-06 NOTE — Progress Notes (Signed)
RN and NT rounded on patient to assist with bed change and wound dressing change. Patient is currently talking on the phone. Would like RN and NT to come back later. Oncoming RN to notify.

## 2022-09-06 NOTE — Progress Notes (Signed)
Patient requested bed change and wound care change. RN rounded and patient is currently on a phone conference with guests. Patient could not get off phone conference to assist nursing staff. RN and NT to round again to assist with bed change and wound care change.

## 2022-09-06 NOTE — Progress Notes (Signed)
Progress Note   Patient: Tracy Nelson DOB: 1946/06/22 DOA: 08/30/2022     7 DOS: the patient was seen and examined on 09/06/2022   Brief hospital course: GRAYLYN BUNNEY was admitted to the hospital with the working diagnosis of heart failure decompensation.   77 y.o. 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  Patient reports 2 days of dyspnea, orthopnea, PND, and cough. Her symptoms were persistent despite use of oral diuretic therapy at home. On her initial physical examination her blood pressure was 120/92, HR 98, RR 21 and 02 saturation 100%, Lungs with decreased breath sounds, no rales or wheezing, heart with S1 and S2 present and rhythmic, abdomen with no distention, no lower extremity edema.   Na 140, K 4,0 CL 111, bicarbonate 16 glucose 101 bun 46 cr 2,89 BNP >4,500 High sensitive troponin 51 and 52 Wbc 9.2 hgb 7.3 plt 275  D dimer 1.95  Sars covid 19 and influenza negative   Chest radiograph with cardiomegaly with bilateral hilar vascular congestion with small bilateral pleura effusions.   EKG 100 bpm, left axis deviation, normal intervals, sinus rhythm with poor R R wave progression, no ST segment changes, negative T wave lead I and AvL.   Patient was placed on IV furosemide and IV heparin V/Q scan negative for pulmonary embolism.   01/22 PRBC transfusion x1 01/23 low output heart failure, started on milrinone infusion, 0.25 mcg/kg/min. 01/25 right heart catheterization with mild elevation of right and left heart filling pressures with mild venous hypertension, good cardiac output on milrinone.  01/26 milrinone is down  0.125 mcg/kg/min 01/27 patient with hypotensive and hypoperfusion on lower dose of milrinone drip. Poor prognosis, end stage  heart failure.  01/28 continue with inotropic support with milrinone.   Assessment and Plan: * Acute on chronic HFrEF (heart failure with reduced ejection fraction) (HCC) Echocardiogram with reduced LV systolic function <60%, with global hypokinesis, severe reduction in RV systolic function, moderate MR, moderate to severe TR. Mild to moderate AI, Moderate pulmonic valve regurgitation. 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   01/25 right heart catheterization.  PA 48/14 with mean 29 PCWP 17  PVR 2.8 wu Cardiac output is 4.2 and index 2,8 (fick).   Pre and post capillary pulmonary hypertension.    Urine output is documented at 400 cc. Systolic blood pressure 109 to 89 mmHg.  SV02 58.9  Continue with IV milrinone (may need palliative home inotropic support vs hospice).  Holding torsemide for now.  Patient with advance disease and poor prognosis.    Elevated d-dimer Due to recent surgery and elevated D-dimer of 1.95 patient was started on IV heparin in the ED due to concern for possible PE despite chronic anticoagulation with Eliquis.  CTA chest not able to be performed due to CKD stage IV. -Can continue IV heparin for now -Follow-up VQ scan, if negative transition back to Eliquis  CKD (chronic kidney disease), stage IV (HCC) AKI on CKD Chronic metabolic acidosis, hyponatremia, hypokalemia  Renal function with serum cr at 3.13 with K at 3,3 and serum bicarbonate at 23.  Na 136 and Mg 1.9  Continue with milrinone for inotropic support.  Follow up renal function and electrolytes in am.  Poor prognosis.   Paroxysmal atrial  fibrillation ([place]) Continue rate control with amiodarone and anticoagulation with apixaban.   Anemia of chronic disease Chronic anemia due to CKD and iron deficiency.  Hemoglobin slightly below recent baseline.  No obvious bleeding.  Continue to monitor.  Rheumatoid arthritis  involving multiple sites with positive rheumatoid factor (HCC) On chronic immunosuppressants.  Continue prednisone, Plaquenil, Imuran.  Pressure ulcer Per 08/31/22 [facility] note "Deep tissue pressure injury to right posterior heel.  Present on admission        Subjective: Patient continue to be very weak and deconditioned, dyspnea on exertion and decreased mobility   Physical Exam: Vitals:   09/05/22 1500 09/06/22 0000 09/06/22 0500 09/06/22 0733  BP: 103/69 96/73 106/72 112/72  Pulse: 92 92 92 89  Resp: '20 18 10 20  '$ Temp: 98.6 F (37 C) 98.6 F (37 C) 98.6 F (37 C) 97.9 F (36.6 C)  TempSrc: Oral Oral Oral Oral  SpO2: 93%   98%  Weight:  49.4 kg 49.4 kg   Height:       Neurology awake and alert ENT with mild pallor Cardiovascular with S1 and S2 present and regular with no gallops or murmurs Moderate JVD.  Respiratory with mild rales at bases with no wheezing or rhonchi Abdomen with no distention  No lower extremity edema  Data Reviewed:    Family Communication: no family at the bedside   Disposition: Status is: Inpatient Remains inpatient appropriate because: Inotropic support.   Planned Discharge Destination: Home     Author: Tawni Millers, MD 09/06/2022 9:47 AM  For on call review [email or web address].

## 2022-09-07 ENCOUNTER — Inpatient Hospital Stay (HOSPITAL_COMMUNITY): Payer: Medicare PPO

## 2022-09-07 DIAGNOSIS — D638 Anemia in other chronic diseases classified elsewhere: Secondary | ICD-10-CM | POA: Diagnosis not present

## 2022-09-07 DIAGNOSIS — Z515 Encounter for palliative care: Secondary | ICD-10-CM | POA: Diagnosis not present

## 2022-09-07 DIAGNOSIS — I5023 Acute on chronic systolic (congestive) heart failure: Secondary | ICD-10-CM | POA: Diagnosis not present

## 2022-09-07 DIAGNOSIS — I480 Paroxysmal atrial fibrillation: Secondary | ICD-10-CM | POA: Diagnosis not present

## 2022-09-07 DIAGNOSIS — Z7189 Other specified counseling: Secondary | ICD-10-CM | POA: Diagnosis not present

## 2022-09-07 DIAGNOSIS — N184 Chronic kidney disease, stage 4 (severe): Secondary | ICD-10-CM | POA: Diagnosis not present

## 2022-09-07 LAB — BASIC METABOLIC PANEL
Anion gap: 10 (ref 5–15)
BUN: 61 mg/dL — ABNORMAL HIGH (ref 8–23)
CO2: 24 mmol/L (ref 22–32)
Calcium: 8.8 mg/dL — ABNORMAL LOW (ref 8.9–10.3)
Chloride: 102 mmol/L (ref 98–111)
Creatinine, Ser: 3.24 mg/dL — ABNORMAL HIGH (ref 0.44–1.00)
GFR, Estimated: 14 mL/min — ABNORMAL LOW (ref >60)
Glucose, Bld: 105 mg/dL — ABNORMAL HIGH (ref 70–99)
Potassium: 3.5 mmol/L (ref 3.5–5.1)
Sodium: 136 mmol/L (ref 135–145)

## 2022-09-07 LAB — COOXEMETRY PANEL
Carboxyhemoglobin: 1.3 % (ref 0.5–1.5)
Carboxyhemoglobin: 1.2 % (ref 0.5–1.5)
Methemoglobin: <0.7 % (ref 0.0–1.5)
Methemoglobin: <0.7 % (ref 0.0–1.5)
O2 Saturation: 63.3 %
O2 Saturation: 66.1 %
Total hemoglobin: 9.2 g/dL — ABNORMAL LOW (ref 12.0–16.0)
Total hemoglobin: 9.5 g/dL — ABNORMAL LOW (ref 12.0–16.0)

## 2022-09-07 LAB — PROCALCITONIN: Procalcitonin: 0.39 ng/mL

## 2022-09-07 LAB — MAGNESIUM: Magnesium: 2.1 mg/dL (ref 1.7–2.4)

## 2022-09-07 MED ORDER — AMIODARONE HCL 200 MG PO TABS: 200.0000 mg | ORAL_TABLET | Freq: Two times a day (BID) | ORAL | Status: DC

## 2022-09-07 MED ORDER — FUROSEMIDE 10 MG/ML IJ SOLN
80.0000 mg | Freq: Once | INTRAMUSCULAR | Status: AC
  Administered 2022-09-07: 80 mg via INTRAVENOUS
  Filled 2022-09-07: qty 8

## 2022-09-07 MED ORDER — POTASSIUM CHLORIDE CRYS ER 20 MEQ PO TBCR
40.0000 meq | EXTENDED_RELEASE_TABLET | Freq: Once | ORAL | Status: AC
  Administered 2022-09-07: 40 meq via ORAL
  Filled 2022-09-07: qty 2

## 2022-09-07 MED ORDER — AMIODARONE HCL 200 MG PO TABS
200.0000 mg | ORAL_TABLET | Freq: Every day | ORAL | Status: DC
  Administered 2022-09-08 – 2022-09-11 (×4): 200 mg via ORAL
  Filled 2022-09-07 (×4): qty 1

## 2022-09-07 NOTE — Plan of Care (Signed)

## 2022-09-07 NOTE — Progress Notes (Signed)
Progress Note   Patient: Tracy Nelson YPP:[id number] DOB: 19-Oct-1945 DOA: 08/30/2022     8 DOS: the patient was seen and examined on 09/07/2022   Brief hospital course: KIKUE GERHART was admitted to the hospital with the working diagnosis of heart failure decompensation.   77 y.o. 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  Patient reports 2 days of dyspnea, orthopnea, PND, and cough. Her symptoms were persistent despite use of oral diuretic therapy at home. On her initial physical examination her blood pressure was 120/92, HR 98, RR 21 and 02 saturation 100%, Lungs with decreased breath sounds, no rales or wheezing, heart with S1 and S2 present and rhythmic, abdomen with no distention, no lower extremity edema.   Na 140, K 4,0 CL 111, bicarbonate 16 glucose 101 bun 46 cr 2,89 BNP >4,500 High sensitive troponin 51 and 52 Wbc 9.2 hgb 7.3 plt 275  D dimer 1.95  Sars covid 19 and influenza negative   Chest radiograph with cardiomegaly with bilateral hilar vascular congestion with small bilateral pleura effusions.   EKG 100 bpm, left axis deviation, normal intervals, sinus rhythm with poor R R wave progression, no ST segment changes, negative T wave lead I and AvL.   Patient was placed on IV furosemide and IV heparin V/Q scan negative for pulmonary embolism.   01/22 PRBC transfusion x1 01/23 low output heart failure, started on milrinone infusion, 0.25 mcg/kg/min. 01/25 right heart catheterization with mild elevation of right and left heart filling pressures with mild venous hypertension, good cardiac output on milrinone.  01/26 milrinone is down  0.125 mcg/kg/min 01/27 patient with hypotensive and hypoperfusion on lower dose of milrinone drip. Poor prognosis, end stage  heart failure.  01/28 continue with inotropic support with milrinone.  01/29 discontinue milrinone.   Assessment and Plan: * Acute on chronic HFrEF (heart failure with reduced ejection fraction) (HCC) Echocardiogram with reduced LV systolic function <45%, with global hypokinesis, severe reduction in RV systolic function, moderate MR, moderate to severe TR. Mild to moderate AI, Moderate pulmonic valve regurgitation. 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   01/25 right heart catheterization.  PA 48/14 with mean 29 PCWP 17  PVR 2.8 wu Cardiac output is 4.2 and index 2,8 (fick).   Pre and post capillary pulmonary hypertension.    Urine output is documented at 1200 cc. Systolic blood pressure 809 to 104 mmHg.  SV02 63,3   Off milrinone today Furosemide 80 mg IV x1  Patient with advance disease and poor prognosis.    Elevated d-dimer Due to recent surgery and elevated D-dimer of 1.95 patient was started on IV heparin in the ED due to concern for possible PE despite chronic anticoagulation with Eliquis.  CTA chest not able to be performed due to CKD stage IV. -Can continue IV heparin for now -Follow-up VQ scan, if negative transition back to Eliquis  CKD (chronic kidney disease), stage IV (HCC) AKI on CKD Chronic metabolic acidosis, hyponatremia, hypokalemia  Today renal function with serum cr at 3,24 with K at 3,5 and serum bicarbonate at 24.    Furosemide 80 mg IV x1 today. Follow up renal function in am.   Paroxysmal atrial fibrillation (HCC) Continue rate control with amiodarone and anticoagulation with apixaban.  Anemia of chronic disease Chronic anemia due to CKD and iron deficiency.  Hemoglobin slightly below recent baseline.  No obvious bleeding.  Continue to monitor.  Rheumatoid arthritis involving multiple sites with positive rheumatoid factor (HCC) On chronic immunosuppressants.   Continue prednisone, Plaquenil, Imuran.  Pressure ulcer Per 08/31/22 [facility] note "Deep tissue pressure injury to right posterior heel.  Present on admission        Subjective: Patient continue very weak and deconditioned, positive cough and generalized weakness.   Physical Exam: Vitals:   09/07/22 0000 09/07/22 0400 09/07/22 0853 09/07/22 1034  BP:  107/89 100/72 104/73  Pulse: 90 90 88 88  Resp:  '16 18 20  '$ Temp: 98.2 F (36.8 C) 98.2 F (36.8 C) 98 F (36.7 C) 98.7 F (37.1 C)  TempSrc: Oral Oral Oral Oral  SpO2:   97% 99%  Weight:  49.3 kg    Height:       Neurology awake and alert ENT with mild pallor with no icterus Cardiovascular with S1 and S2 present and regular with no gallops, or murmurs Respiratory with poor inspiratory effort with mild rales at bases with no wheezing Abdomen with no distention  No lower extremity edema  Data Reviewed:    Family Communication: no family at the bedside   Disposition: Status is: Inpatient Remains inpatient appropriate because: heart failure   Planned Discharge Destination: Home      Author: Tawni Millers, MD 09/07/2022 1:21 PM  For on call review [email or web address]

## 2022-09-07 NOTE — Progress Notes (Signed)
Patient ID: AILI CASILLAS, female   DOB: [birth year], 77 y.o.   MRN: [id number]    Progress Note from the Palliative Medicine Team at [facility]   Patient Name: Tracy Nelson        Date: 09/07/2022 DOB: 1946-02-25  Age: 77 y.o. MRN#: [id number] Attending Physician: Tawni Millers Primary Care Physician: [name], [place]. Admit Date: 08/30/2022   Medical records reviewed, discussed with treatment team   This NP assessed patient at the bedside as a follow up for palliative medicine needs and emotional support.   77 y.o. female   admitted on 08/30/2022 with past  medical history significant for HFrEF (EF improved to 45-50% 07/11/2022), CKD stage IV, paroxysmal A-fib/flutter on Eliquis, anemia of chronic disease, rheumatoid arthritis on chronic immunosuppressants, left hip fracture s/p IM nail 07/10/2022.    Patient reports 2 days of dyspnea, orthopnea, PND, and cough. Her symptoms were persistent despite use of oral diuretic therapy at home.    Chest radiograph with cardiomegaly with bilateral hilar vascular congestion with small bilateral pleura effusions.    End stage heart failure, CKD.     Patient faces treatment option decisions, advanced directive decisions and anticipatory care needs.  Continued education regarding current medical situation.  Created space and opportunity for Tracy Nelson to explore her thoughts and feelings regarding the seriousness of her chronic disease.    Patient was able to talk about thoughts on her own mortality as she shares her friends advise " we don't come here to stay".    She remains hopeful for more quality time with her family.  She shares her great love and appreciation for her grand-children.  She expresses appreciation for today's conversation.  Her daughter will be back in town tomorrow.  Hopefully a family meeting will be scheduled as next steps in treatment plan are evolving.     Education offered today regarding  the  importance of continued conversation with family and their  medical providers regarding overall plan of care and treatment options,  ensuring decisions are within the context of the patients values and GOCs.  Questions and concerns addressed   Total time spent on the unit was 50 minutes    Wadie Lessen NP  Palliative Medicine Team Team Phone # [phone] Pager [phone]

## 2022-09-07 NOTE — Progress Notes (Signed)
Physical Therapy Treatment Patient Details Name: Tracy Nelson MRN: [id number] DOB: [birth year] Today's Date: 09/07/2022   History of Present Illness 77 y/o female who presents on 1/21 with cough, SOB, weakness. CXR- small bilateral pleural effusions. Admitted with acute on chronic HF with possible PE started on Heparin drip. PMH includes HF, nonischemic cardiomyopathy, RA, CKD IV, recent IM nail hip 07/10/22, HTN.    PT Comments    Pt was seen for mobility to progress her gait, but declined to walk due to her fatigue from AM with MT.  Pt is on bed when PT arrives, able to move legs and follow exercise directions.  Her ability to walk on last session was good, with pt reporting mainly some fatigue.  As as result, will continue to recommend HHPT for follow up care with family assisting her.  Will check telemetry for sats and HR fluctuations with gait.     Recommendations for follow up therapy are one component of a multi-disciplinary discharge planning process, led by the attending physician.  Recommendations may be updated based on patient status, additional functional criteria and insurance authorization.  Follow Up Recommendations  Home health PT     Assistance Recommended at Discharge Frequent or constant Supervision/Assistance  Patient can return home with the following A little help with walking and/or transfers;A little help with bathing/dressing/bathroom;Assistance with cooking/housework;Assist for transportation;Help with stairs or ramp for entrance   Equipment Recommendations  None recommended by PT    Recommendations for Other Services       Precautions / Restrictions Precautions Precautions: Fall Precaution Comments: monitor HR and sats Restrictions Weight Bearing Restrictions: Yes LLE Weight Bearing: Weight bearing as tolerated     Mobility  Bed Mobility Overal bed mobility: Modified Independent                  Transfers                   General  transfer comment: deferred    Ambulation/Gait                   Stairs             Wheelchair Mobility    Modified Rankin (Stroke Patients Only)       Balance                                            Cognition Arousal/Alertness: Awake/alert Behavior During Therapy: WFL for tasks assessed/performed Overall Cognitive Status: Within Functional Limits for tasks assessed                                          Exercises General Exercises - Lower Extremity Ankle Circles/Pumps: AAROM, 5 reps Quad Sets: AROM, 10 reps Gluteal Sets: AROM, 10 reps Heel Slides: AROM, AAROM, 10 reps Hip ABduction/ADduction: AROM, AAROM, 10 reps Straight Leg Raises: AROM, 10 reps Hip Flexion/Marching: AROM, 10 reps    General Comments General comments (skin integrity, edema, etc.): Pt was in bed and declined to get up to walk but agreed to exercises on the bed.  Has been up moving with RW and demonstrating control of balance.      Pertinent Vitals/Pain Pain Assessment Pain Assessment: Faces Faces Pain Scale: Hurts a little bit  Pain Location: L hip Pain Descriptors / Indicators: Guarding Pain Intervention(s): Limited activity within patient's tolerance, Premedicated before session, Monitored during session, Repositioned    Home Living                          Prior Function            PT Goals (current goals can now be found in the care plan section) Acute Rehab PT Goals Patient Stated Goal: to return home Progress towards PT goals: Not progressing toward goals - comment    Frequency    Min 3X/week      PT Plan Current plan remains appropriate    Co-evaluation              AM-PAC PT "6 Clicks" Mobility   Outcome Measure  Help needed turning from your back to your side while in a flat bed without using bedrails?: None Help needed moving from lying on your back to sitting on the side of a flat bed without  using bedrails?: None Help needed moving to and from a bed to a chair (including a wheelchair)?: A Little Help needed standing up from a chair using your arms (e.g., wheelchair or bedside chair)?: A Little Help needed to walk in hospital room?: A Little Help needed climbing 3-5 steps with a railing? : A Lot 6 Click Score: 19    End of Session   Activity Tolerance: Patient limited by fatigue Patient left: in bed;with call bell/phone within reach Nurse Communication: Mobility status PT Visit Diagnosis: Muscle weakness (generalized) (M62.81);Difficulty in walking, not elsewhere classified (R26.2)     Time: 1337-1350 PT Time Calculation (min) (ACUTE ONLY): 13 min  Charges:  $Therapeutic Exercise: 8-22 mins      Ramond Dial 09/07/2022, 3:38 PM   Mee Hives, PT PhD Acute Rehab Dept. Number: [place] [facility] [place]

## 2022-09-07 NOTE — Progress Notes (Signed)
   09/07/22 1556  Mobility  Activity Transferred from bed to chair  Level of Assistance Minimal assist, patient does 75% or more  Assistive Device  (HHA)  Distance Ambulated (ft) 5 ft  LLE Weight Bearing WBAT  Activity Response Tolerated fair  Mobility Referral Yes  $Mobility charge 1 Mobility   Mobility Specialist Progress Note  Pt was in bed and agreeable. Had c/o stiffness. Left in chair w/ all needs met and call bell in reach.   Lucious Groves Mobility Specialist  Please contact via SecureChat or Rehab office at [phone]

## 2022-09-07 NOTE — Progress Notes (Addendum)
Advanced Heart Failure Rounding Note  PCP-Cardiologist: None   Subjective:   RHC 1/25 on milrinone .25  Hemodynamics RA mean 10 RV 41/13 PA 48/14 mean 29 PCWP mean 17 Oxygen saturations: PA 54% AO 97% Cardiac Output (Fick) 4.22  Cardiac Index (Fick) 2.81 PVR 2.8 WU PAPi 3.4   - Milrinone at 0.156mg/kg/min, co-ox 63%. - CVP elevated today 9/10 (good waveform). Cr 3.24. Denies CP/SOB. Sat up in chair yesterday.   Objective:   Weight Range: 49.3 kg Body mass index is 19.25 kg/m.   Vital Signs:   Temp:  [98.2 F (36.8 C)-98.7 F (37.1 C)] 98.2 F (36.8 C) (01/29 0400) Pulse Rate:  [87-90] 90 (01/29 0400) Resp:  [16-21] 16 (01/29 0400) BP: (91-107)/(69-89) 107/89 (01/29 0400) SpO2:  [96 %-98 %] 96 % (01/28 1600) Weight:  [49.3 kg] 49.3 kg (01/29 0400) Last BM Date : 09/06/22  Weight change: Filed Weights   09/06/22 0000 09/06/22 0500 09/07/22 0400  Weight: 49.4 kg 49.4 kg 49.3 kg    Intake/Output:   Intake/Output Summary (Last 24 hours) at 09/07/2022 0804 Last data filed at 09/06/2022 1613 Gross per 24 hour  Intake 120 ml  Output 300 ml  Net -180 ml      Physical Exam  CVP 9/10 General:  chronically ill appearing.  No respiratory difficulty HEENT: normal Neck: supple. JVD ~10 cm. Carotids 2+ bilat; no bruits. No lymphadenopathy or thyromegaly appreciated. Cor: PMI nondisplaced. Regular rate & rhythm. No rubs, gallops or murmurs. Lungs: clear Abdomen: soft, nontender, nondistended. No hepatosplenomegaly. No bruits or masses. Good bowel sounds. Extremities: no cyanosis, clubbing, rash, edema. PICC RUE Neuro: alert & oriented x 3, cranial nerves grossly intact. moves all 4 extremities w/o difficulty. Affect pleasant.   Telemetry   NSR 80s 3-5 PVCs/hr (Personally reviewed)    EKG    No new EKG to review  Labs    CBC No results for input(s): "WBC", "NEUTROABS", "HGB", "HCT", "MCV", "PLT" in the last 72 hours. Basic Metabolic Panel Recent  Labs    09/06/22 0516 09/07/22 0607  NA 136 136  K 3.3* 3.5  CL 103 102  CO2 23 24  GLUCOSE 126* 105*  BUN 64* 61*  CREATININE 3.13* 3.24*  CALCIUM 8.4* 8.8*  MG 1.9 2.1   Liver Function Tests No results for input(s): "AST", "ALT", "ALKPHOS", "BILITOT", "PROT", "ALBUMIN" in the last 72 hours.  No results for input(s): "LIPASE", "AMYLASE" in the last 72 hours. Cardiac Enzymes No results for input(s): "CKTOTAL", "CKMB", "CKMBINDEX", "TROPONINI" in the last 72 hours.  BNP: BNP (last 3 results) Recent Labs    07/07/22 1515 08/27/22 1658 08/30/22 1409  BNP 232.0* >5000.0* >4,500.0*    Imaging    No results found.   Medications:     Scheduled Medications:  amiodarone  400 mg Oral BID   apixaban  2.5 mg Oral BID   azaTHIOprine  100 mg Oral Daily   Chlorhexidine Gluconate Cloth  6 each Topical Daily   folic acid  1 mg Oral Daily   hydrocerin   Topical Daily   lidocaine  1 patch Transdermal Q24H   pantoprazole  80 mg Oral Daily   predniSONE  5 mg Oral Q breakfast   sodium bicarbonate  650 mg Oral Daily   sodium chloride flush  10-40 mL Intracatheter Q12H   sodium chloride flush  3 mL Intravenous Q12H   sodium chloride flush  3 mL Intravenous Q12H   sodium chloride flush  3 mL Intravenous Q12H   Vericiguat  10 mg Oral Daily    Infusions:  milrinone 0.125 mcg/kg/min (09/05/22 0518)    PRN Medications: acetaminophen **OR** acetaminophen, alum & mag hydroxide-simeth, guaiFENesin-dextromethorphan, ondansetron **OR** ondansetron (ZOFRAN) IV, senna-docusate, sodium chloride flush    Patient Profile   Ms. Rodd is a 77 y.o. AAF with history of rheumatoid arthritis, atrial fibrillation, CKD stage 3, post-herpetic neuralgia (shingles L upper back 3/23) and nonischemic cardiomyopathy. 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    Assessment/Plan   1. Acute on chronic mid-range (previously systolic) CHF: Nonischemic  cardiomyopathy.   - Echo 07/2022 EF improved 45-50%. RV normal  - RHC 12/21 low filling pressures and relatively preserved cardiac index at 2.22. - Echo 09/01/22 EF < 20% RV severely reduced - NYHA class IIIb/IV on admission - Continue midodrine 5 mg tid.  - Continue Verquvo 10 mg daily.  - Repeat TTE on 09/01/22 with severe biventricular failure - hip repair ?nail precludes her from CMR.  - Suspect she is end-stage. Continue milrinone for now.  - CVP 9/10, will give 80 IV lasix x1 today. Keep milrinone while diuresing. Will f/u later today and may d/c.  - Very lengthy discussion with patient and her sisters today regarding long term prognosis & options.   2. AKI on CKD IV - She is followed by Dr. Royce Macadamia. - Baseline 2.4-2.6?Marland Kitchen Most recently her baseline has been around 2.8-3.3 - sCr 3.24, diuretics held since 1/28   3. Rheumatoid arthritis: No history of lung involvement. On low dose of prednisone chronically.    4. Atrial fibrillation/flutter: S/p TEE-DCCV in 4/22.  She is now on amiodarone and in NSR.  - Continue amiodarone 200 mg BID. (recently increase with increased ectopy) LFTs and TSH normal (11/23). - Eliquis    5. Shingles: Resolved.    6. Left Hip Fracture - S/P IM Nail 07/2022 - PT/OT   7. Swollen lymph nodes - primarily L side - white patches seen in back of throat - discussed with primary - strep (-)   8. Microcytic anemia - 2/2 plaquenil? - per primary - Hgb 7 on admission. s/p 1uPRBc 09/02/22  Length of Stay: [place], NP  09/07/2022, 8:04 AM  Advanced Heart Failure Team Pager [phone] (M-F; 7a - 5p)  Please contact [facility] for night-coverage after hours (5p -7a ) and weekends on [email or web address]  Patient seen with NP, agree with the above note.   No complaints today.  She remains on milrinone 0.125 with CVP 5.  Co-ox 63%.  Creatinine stable at 3.24.   General: NAD Neck: No JVD, no thyromegaly or thyroid nodule.  Lungs: Clear to auscultation  bilaterally with normal respiratory effort. CV: Nondisplaced PMI.  Heart regular S1/S2, no S3/S4, no murmur.  No peripheral edema.    Abdomen: Soft, nontender, no hepatosplenomegaly, no distention.  Skin: Intact without lesions or rashes.  Neurologic: Alert and oriented x 3.  Psych: Normal affect. Extremities: No clubbing or cyanosis.  HEENT: Normal.   Volume status now optimized. Got IV Lasix this morning, start torsemide 20 mg daily tomorrow.  Stop milrinone today with good co-ox.  Reassess in am. Continue Verquvo. She is not a candidate for LVAD with frailty and renal dysfunction.   Can decrease amiodarone to 200 mg daily.   [place], currently PT recommending home health/PT.   Loralie Champagne 09/07/2022 12:23 PM

## 2022-09-07 NOTE — Significant Event (Signed)
Blood Gas sent to Lab.

## 2022-09-08 ENCOUNTER — Encounter (HOSPITAL_COMMUNITY): Payer: Medicare PPO

## 2022-09-08 DIAGNOSIS — I5023 Acute on chronic systolic (congestive) heart failure: Secondary | ICD-10-CM | POA: Diagnosis not present

## 2022-09-08 DIAGNOSIS — I480 Paroxysmal atrial fibrillation: Secondary | ICD-10-CM | POA: Diagnosis not present

## 2022-09-08 DIAGNOSIS — D638 Anemia in other chronic diseases classified elsewhere: Secondary | ICD-10-CM | POA: Diagnosis not present

## 2022-09-08 DIAGNOSIS — N184 Chronic kidney disease, stage 4 (severe): Secondary | ICD-10-CM | POA: Diagnosis not present

## 2022-09-08 LAB — COOXEMETRY PANEL
Carboxyhemoglobin: 1.5 % (ref 0.5–1.5)
Methemoglobin: <0.7 % (ref 0.0–1.5)
O2 Saturation: 59.5 %
Total hemoglobin: 9 g/dL — ABNORMAL LOW (ref 12.0–16.0)

## 2022-09-08 LAB — BASIC METABOLIC PANEL
Anion gap: 9 (ref 5–15)
BUN: 59 mg/dL — ABNORMAL HIGH (ref 8–23)
CO2: 24 mmol/L (ref 22–32)
Calcium: 8.9 mg/dL (ref 8.9–10.3)
Chloride: 102 mmol/L (ref 98–111)
Creatinine, Ser: 2.85 mg/dL — ABNORMAL HIGH (ref 0.44–1.00)
GFR, Estimated: 17 mL/min — ABNORMAL LOW (ref >60)
Glucose, Bld: 104 mg/dL — ABNORMAL HIGH (ref 70–99)
Potassium: 4.5 mmol/L (ref 3.5–5.1)
Sodium: 135 mmol/L (ref 135–145)

## 2022-09-08 LAB — BRAIN NATRIURETIC PEPTIDE: B Natriuretic Peptide: 2633.7 pg/mL — ABNORMAL HIGH (ref 0.0–100.0)

## 2022-09-08 LAB — MAGNESIUM: Magnesium: 2 mg/dL (ref 1.7–2.4)

## 2022-09-08 MED ORDER — BUMETANIDE 1 MG PO TABS
1.0000 mg | ORAL_TABLET | Freq: Every day | ORAL | Status: DC
  Administered 2022-09-08: 1 mg via ORAL
  Filled 2022-09-08 (×2): qty 1

## 2022-09-08 NOTE — Progress Notes (Signed)
Progress Note   Patient: Tracy Nelson CZY:[id number] DOB: 02-24-1946 DOA: 08/30/2022     9 DOS: the patient was seen and examined on 09/08/2022   Brief hospital course: BERDENE ASKARI was admitted to the hospital with the working diagnosis of heart failure decompensation.   77 y.o. 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  Patient reports 2 days of dyspnea, orthopnea, PND, and cough. Her symptoms were persistent despite use of oral diuretic therapy at home. On her initial physical examination her blood pressure was 120/92, HR 98, RR 21 and 02 saturation 100%, Lungs with decreased breath sounds, no rales or wheezing, heart with S1 and S2 present and rhythmic, abdomen with no distention, no lower extremity edema.   Na 140, K 4,0 CL 111, bicarbonate 16 glucose 101 bun 46 cr 2,89 BNP >4,500 High sensitive troponin 51 and 52 Wbc 9.2 hgb 7.3 plt 275  D dimer 1.95  Sars covid 19 and influenza negative   Chest radiograph with cardiomegaly with bilateral hilar vascular congestion with small bilateral pleura effusions.   EKG 100 bpm, left axis deviation, normal intervals, sinus rhythm with poor R R wave progression, no ST segment changes, negative T wave lead I and AvL.   Patient was placed on IV furosemide and IV heparin V/Q scan negative for pulmonary embolism.   01/22 PRBC transfusion x1 01/23 low output heart failure, started on milrinone infusion, 0.25 mcg/kg/min. 01/25 right heart catheterization with mild elevation of right and left heart filling pressures with mild venous hypertension, good cardiac output on milrinone.  01/26 milrinone is down  0.125 mcg/kg/min 01/27 patient with hypotensive and hypoperfusion on lower dose of milrinone drip. Poor prognosis, end stage  heart failure.  01/28 continue with inotropic support with milrinone.  01/29 discontinue milrinone.  01/30 off milrinone and resume oral diuretic therapy.   Assessment and Plan: * Acute on chronic HFrEF (heart failure with reduced ejection fraction) (HCC) Echocardiogram with reduced LV systolic function <09%, with global hypokinesis, severe reduction in RV systolic function, moderate MR, moderate to severe TR. Mild to moderate AI, Moderate pulmonic valve regurgitation. 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   01/25 right heart catheterization.  PA 48/14 with mean 29 PCWP 17  PVR 2.8 wu Cardiac output is 4.2 and index 2,8 (fick).   Pre and post capillary pulmonary hypertension.    Urine output is documented at 1100 cc. Systolic blood pressure 323 to 104 mmHg.  SV02 59.5    Off milrinone  Resumed oral loop diuretic therapy.  Patient with advance disease and poor prognosis.    Elevated d-dimer Due to recent surgery and elevated D-dimer of 1.95 patient was started on IV heparin in the ED due to concern for possible PE despite chronic anticoagulation with Eliquis.  CTA chest not able to be performed due to CKD stage IV. -Can continue IV heparin for now -Follow-up VQ scan, if negative transition back to Eliquis  CKD (chronic kidney disease), stage IV (HCC) AKI on CKD Chronic metabolic acidosis, hyponatremia, hypokalemia  Renal function with serum cr at 2,85 with K at 4,5 and serum bicarbonate at 24.  Na 135 Mg 2,0    Resumed oral loop diuretic therapy. Continue to follow up renal function and electrolytes in am.  Paroxysmal atrial fibrillation (HCC) Continue rate control with amiodarone and anticoagulation with apixaban.   Anemia of chronic disease Chronic anemia due to CKD and iron deficiency.  Hemoglobin slightly below recent baseline.  No obvious bleeding.  Continue to monitor.  Rheumatoid  arthritis involving multiple sites with positive rheumatoid factor (HCC) On chronic immunosuppressants.  Continue prednisone, Plaquenil, Imuran.  Pressure ulcer Per 08/31/22 WOC note "Deep tissue pressure injury to right posterior heel.  Present on admission        Subjective: Patient continue to be very weak and deconditioned, no chest pain.   Physical Exam: Vitals:   09/07/22 1634 09/08/22 0000 09/08/22 0405 09/08/22 0801  BP: 92/70 103/78 101/73 100/76  Pulse: 89 86 83 84  Resp: '20 15 19 18  '$ Temp: 98.7 F (37.1 C) 98.4 F (36.9 C) 98.5 F (36.9 C) 98.4 F (36.9 C)  TempSrc: Oral Oral Oral Oral  SpO2: 100% 98% 99% 99%  Weight:   50.1 kg   Height:       Neurology awake and alert ENT with mild pallor Cardiovascular with S1 and S2 present and rhythmic with no gallops or murmurs Respiratory with no rales or wheezing, no rhonchi Abdomen with no distention No lower extremity edema  Data Reviewed:    Family Communication: no family at the bedside   Disposition: Status is: Inpatient Remains inpatient appropriate because: heart failure   Planned Discharge Destination: Home     Author: Tawni Millers, MD 09/08/2022 4:15 PM  For on call review [email or web address].

## 2022-09-08 NOTE — Progress Notes (Addendum)
Advanced Heart Failure Rounding Note  PCP-Cardiologist: None   Subjective:   RHC 1/25 on milrinone .25   Yesterday diuresed with IV lasix and milrinone stopped. I/O not accurate.   CO-OX 59%   Had a hard time sleeping last night. Got short of breath. O2 sats stable.   Objective:   Weight Range: 50.1 kg Body mass index is 19.57 kg/m.   Vital Signs:   Temp:  [98 F (36.7 C)-98.7 F (37.1 C)] 98.4 F (36.9 C) (01/30 0801) Pulse Rate:  [83-89] 84 (01/30 0801) Resp:  [15-20] 18 (01/30 0801) BP: (92-105)/(70-78) 100/76 (01/30 0801) SpO2:  [97 %-100 %] 99 % (01/30 0801) Weight:  [50.1 kg] 50.1 kg (01/30 0405) Last BM Date : 09/06/22  Weight change: Filed Weights   09/06/22 0500 09/07/22 0400 09/08/22 0405  Weight: 49.4 kg 49.3 kg 50.1 kg    Intake/Output:   Intake/Output Summary (Last 24 hours) at 09/08/2022 0818 Last data filed at 09/08/2022 0001 Gross per 24 hour  Intake 780 ml  Output 1100 ml  Net -320 ml     CVP 3-4 Physical Exam  General:  Frail. . No resp difficulty HEENT: normal Neck: supple. no JVD. Carotids 2+ bilat; no bruits. No lymphadenopathy or thryomegaly appreciated. Cor: PMI nondisplaced. Regular rate & rhythm. No rubs, gallops or murmurs. Lungs: clear Abdomen: soft, nontender, nondistended. No hepatosplenomegaly. No bruits or masses. Good bowel sounds. Extremities: no cyanosis, clubbing, rash, edema. RUE PICC  Neuro: alert & orientedx3, cranial nerves grossly intact. moves all 4 extremities w/o difficulty. Affect pleasant  Telemetry  SR occasional PVCs.   EKG    No new EKG to review  Labs    CBC No results for input(s): "WBC", "NEUTROABS", "HGB", "HCT", "MCV", "PLT" in the last 72 hours. Basic Metabolic Panel Recent Labs    09/07/22 0607 09/08/22 0407  NA 136 135  K 3.5 4.5  CL 102 102  CO2 24 24  GLUCOSE 105* 104*  BUN 61* 59*  CREATININE 3.24* 2.85*  CALCIUM 8.8* 8.9  MG 2.1 2.0   Liver Function Tests No results for  input(s): "AST", "ALT", "ALKPHOS", "BILITOT", "PROT", "ALBUMIN" in the last 72 hours.  No results for input(s): "LIPASE", "AMYLASE" in the last 72 hours. Cardiac Enzymes No results for input(s): "CKTOTAL", "CKMB", "CKMBINDEX", "TROPONINI" in the last 72 hours.  BNP: BNP (last 3 results) Recent Labs    08/27/22 1658 08/30/22 1409 09/08/22 0407  BNP >5000.0* >4,500.0* 2,633.7*    Imaging    DG Chest Port 1 View  Result Date: 09/07/2022 CLINICAL DATA:  Shortness of breath EXAM: PORTABLE CHEST 1 VIEW COMPARISON:  08/30/2022 FINDINGS: Cardiac shadow is enlarged but stable. Right PICC is noted at the cavoatrial junction. The lungs are well aerated. Minimal atelectatic changes in the left base are seen. No other focal abnormality is noted. IMPRESSION: Mild left basilar atelectasis. Electronically Signed   By: Inez Catalina M.D.   On: 09/07/2022 20:26     Medications:     Scheduled Medications:  amiodarone  200 mg Oral Daily   apixaban  2.5 mg Oral BID   azaTHIOprine  100 mg Oral Daily   Chlorhexidine Gluconate Cloth  6 each Topical Daily   folic acid  1 mg Oral Daily   hydrocerin   Topical Daily   lidocaine  1 patch Transdermal Q24H   pantoprazole  80 mg Oral Daily   predniSONE  5 mg Oral Q breakfast   sodium bicarbonate  650 mg Oral Daily   sodium chloride flush  10-40 mL Intracatheter Q12H   sodium chloride flush  3 mL Intravenous Q12H   Vericiguat  10 mg Oral Daily    Infusions:    PRN Medications: acetaminophen **OR** acetaminophen, alum & mag hydroxide-simeth, guaiFENesin-dextromethorphan, ondansetron **OR** ondansetron (ZOFRAN) IV, senna-docusate, sodium chloride flush    Patient Profile   Tracy Nelson is a 77 y.o. AAF with history of rheumatoid arthritis, atrial fibrillation, CKD stage 3, post-herpetic neuralgia (shingles L upper back 3/23) and nonischemic cardiomyopathy. 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     Assessment/Plan   1. 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   - Echo 07/2022 EF improved 45-50%. RV normal  - RHC 12/21 low filling pressures and relatively preserved cardiac index at 2.22. - Echo 09/01/22 EF < 20% RV severely reduced - NYHA class IIIb/IV on admission - Continue midodrine 5 mg tid.  - Continue Verquvo 10 mg daily.  - Repeat TTE on 09/01/22 with severe biventricular failure - hip repair ?nail precludes her from CMR.  - Suspect she is end-stage.  - CO-OX stable off milrinone.  - CVP 3-4 today. On bumex at home. Restart bumex 1 mg daily.  - Renal function ok.    2. AKI on CKD IV - She is followed by Dr. Royce Macadamia. - Baseline 2.4-2.6?Marland Kitchen Most recently her baseline has been around 2.8-3.3 - Creatinine peaked 3.24--> today 2.85 - Check BMET in am.     3. Rheumatoid arthritis: No history of lung involvement. On low dose of prednisone chronically.    4. Atrial fibrillation/flutter: S/p TEE-DCCV in 4/22.   - Maintaining SR.  - Continue amio 200 mg daily.  -  LFTs and TSH normal (11/23). - Continue Eliquis 2.5 mg twice a day. On lower dose with weight/creatinine   5. Shingles: Resolved.    6. Left Hip Fracture - S/P IM Nail 07/2022 - PT/OT-->HH. Plan to go back home with her daughter.    7. Swollen lymph nodes - primarily L side - white patches seen in back of throat - discussed with primary - strep (-)   8. Microcytic anemia - 2/2 plaquenil? - per primary - Hgb 7 on admission. s/p 1uPRBc 09/02/22 - Got feraheme 09/01/22    Length of Stay: [place], NP  09/08/2022, 8:18 AM  Advanced Heart Failure Team Pager [phone] (M-F; 7a - 5p)  Please contact [facility] for night-coverage after hours (5p -7a ) and weekends on [email or web address]  Patient seen with NP, agree with the above note.   Subjectively short of breath.  CVP 3 on my read.  Co-ox 60% off milrinone.  Creatinine trending down, 2.85 today.  General: NAD,  frail Neck: No JVD, no thyromegaly or thyroid nodule.  Lungs: Clear to auscultation bilaterally with normal respiratory effort. CV: Nondisplaced PMI.  Heart regular S1/S2, no S3/S4, no murmur.  No peripheral edema.   Abdomen: Soft, nontender, no hepatosplenomegaly, no distention.  Skin: Intact without lesions or rashes.  Neurologic: Alert and oriented x 3.  Psych: Normal affect. Extremities: No clubbing or cyanosis.  HEENT: Normal.   Volume status looks optimized at this point.  Start back on home bumetanide 1 mg daily.  Co-ox is stable off milrinone.  Continue Verquvo, no other GDMT options at this point with relatively low BP and AKI on CKD 3.  Creatinine is now trending back to baseline. Not candidate for advanced  therapies.   Remains in NSR on amiodarone and apixaban.   Frail, needs ongoing PT.    Loralie Champagne 09/08/2022 11:49 AM

## 2022-09-08 NOTE — Progress Notes (Signed)
TRH night cross cover note:   I was notified by patient's RN that the patient is complaining of some shortness of breath, without any associated symptoms reported, including no associated chest pain.  Vital signs stable, with oxygen saturation 100% on room air.  Afebrile.  Lungs clear bilaterally.  She had recently completed milrinone.  Source of her shortness of breath is unclear at this time, but will further evaluate with chest x-ray, EKG, procalcitonin level, and BNP.   Babs Bertin, DO Hospitalist

## 2022-09-08 NOTE — Progress Notes (Signed)
PROGRESS NOTE    Tracy Nelson  QMG:[id number] DOB: 02-05-1946 DOA: 08/30/2022 PCP: [name], [name]  77/F, chronically ill with chronic systolic CHF, NICM, EF 32-67% recently improved to 45%, CKD 4, paroxysmal A-fib/A-flutter, anemia of chronic disease, rheumatoid arthritis on Plaquenil and azathioprine, left hip fracture treated with IM nail 07/10/2022, postop went to rehab and was discharged home 5 days ago. -Presented to the ED with progressive dyspnea on exertion, orthopnea and cough, Compliant with Bumex daily, took an extra dose day prior to admission -In the ED vital stable, hemoglobin 7.3, creatinine 2.9, bicarb 16, BNP> 4500, D-dimer 1.95, troponin 51 -Chest x-ray with small bilateral pleural effusions -Admitted, started on IV Lasix, IV heparin, VQ scan negative -1/22: Transfused PRBC, Lasix X1 -1/23 low output heart failure, started on milrinone infusion, 0.25 mcg/kg/min. -1/25 right heart catheterization with mild elevation of right and left heart filling pressures with mild venous hypertension, good cardiac output on milrinone.  -1/26 milrinone down  0.125 mcg/kg/min -1/27 patient with hypotensive and hypoperfusion on lower dose of milrinone drip. Poor prognosis, end stage heart failure. Palliative consulted -1/28 continue with inotropic support with milrinone.  -1/29 discontinue milrinone.  -1/30  resumed oral Bumex  Subjective: -Some shortness of breath overnight  Assessment and Plan:  Acute on chronic HFrEF -Known NICM, previously EF 20-25%, last echo 12/23 with improvement to 12-45%, grade 1 diastolic dysfunction, normal RV -Followed by Dr. 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 Royce Macadamia.   Elevated d-dimer -Likely nonspecific in the setting of long history of RA, VQ scan negative, heparin discontinued, Eliquis resumed  Paroxysmal atrial fibrillation (HCC) -Sinus rhythm at this time, continue amiodarone and IV heparin as above.  Rheumatoid arthritis involving multiple sites with positive rheumatoid factor (HCC) On chronic immunosuppressants.  Continue prednisone, Imuran. -No indication for stress dose steroids -Reviewed rheumatology notes, Dr. Benjamine Mola 2/23 and discontinued Plaquenil, patient unfortunately patient continue to take this for 11 months after DC  DVT prophylaxis: apixaban Code Status: Full code Family Communication: None present, called and updated daughter leafy Disposition Plan: Home pending improvement in kidney function  Consultants: CHF team, Palliative   Procedures:   Antimicrobials:    Objective: Vitals:   09/08/22 0000 09/08/22 0405 09/08/22 0801 09/08/22 1627  BP: 103/78 101/73 100/76 96/66  Pulse: 86 83 84 87  Resp: '15 19 18 18  '$ Temp: 98.4 F (36.9 C) 98.5 F (36.9 C) 98.4 F (36.9 C) 98.2 F (36.8 C)  TempSrc: Oral  Oral Oral Oral   SpO2: 98% 99% 99% 99%  Weight:  50.1 kg    Height:        Intake/Output Summary (Last 24 hours) at 09/08/2022 1950 Last data filed at 09/08/2022 1833 Gross per 24 hour  Intake 840 ml  Output 0 ml  Net 840 ml   Filed Weights   09/06/22 0500 09/07/22 0400 09/08/22 0405  Weight: 49.4 kg 49.3 kg 50.1 kg    Examination:      Data Reviewed:   CBC: Recent Labs  Lab 09/02/22 0500 09/03/22 1609 09/03/22 1610  WBC 6.7  --   --   HGB 8.1* 9.5* 10.2*  HCT 26.0* 28.0* 30.0*  MCV 94.2  --   --   PLT 224  --   --    Basic Metabolic Panel: Recent Labs  Lab 09/04/22 0400 09/05/22 0226 09/06/22 0516 09/07/22 0607 09/08/22 0407  NA 135 134* 136 136 135  K 4.1 3.8 3.3* 3.5 4.5  CL 104 100 103 102 102  CO2 '22 24 23 24 24  '$ GLUCOSE 113* 126* 126* 105* 104*  BUN 55* 64* 64* 61* 59*  CREATININE 3.11* 3.37* 3.13* 3.24* 2.85*  CALCIUM 8.8* 8.8* 8.4* 8.8* 8.9  MG 2.1 2.0 1.9 2.1 2.0   GFR: Estimated Creatinine Clearance: 13.1 mL/min (A) (by C-G formula based on SCr of 2.85 mg/dL (H)). Liver Function Tests: No results for input(s): "AST", "ALT", "ALKPHOS", "BILITOT", "PROT", "ALBUMIN" in the last 168 hours.  No results for input(s): "LIPASE", "AMYLASE" in the last 168 hours. No results for input(s): "AMMONIA" in the last 168 hours. Coagulation Profile: No results for input(s): "INR", "PROTIME" in the last 168 hours. Cardiac Enzymes: No results for input(s): "CKTOTAL", "CKMB", "CKMBINDEX", "TROPONINI" in the last 168 hours. BNP (last 3 results) No results for input(s): "PROBNP" in the last 8760 hours. HbA1C: No results for input(s): "HGBA1C" in the last 72 hours. CBG: No results for input(s): "GLUCAP" in the last 168 hours. Lipid Profile: No results for input(s): "CHOL", "HDL", "LDLCALC", "TRIG", "CHOLHDL", "LDLDIRECT" in the last 72 hours. Thyroid Function Tests: No results for input(s): "TSH", "T4TOTAL", "FREET4", "T3FREE", "THYROIDAB" in the last 72 hours. Anemia  Panel: No results for input(s): "VITAMINB12", "FOLATE", "FERRITIN", "TIBC", "IRON", "RETICCTPCT" in the last 72 hours.  Urine analysis:    Component Value Date/Time   COLORURINE YELLOW 07/13/2022 0945   APPEARANCEUR CLEAR 07/13/2022 0945   LABSPEC 1.012 07/13/2022 0945   PHURINE 5.0 07/13/2022 0945   GLUCOSEU NEGATIVE 07/13/2022 0945   HGBUR NEGATIVE 07/13/2022 0945   BILIRUBINUR NEGATIVE 07/13/2022 0945   BILIRUBINUR negative 06/04/2021 1151   KETONESUR NEGATIVE 07/13/2022 0945   PROTEINUR NEGATIVE 07/13/2022 0945   UROBILINOGEN 0.2 06/04/2021 1151   NITRITE NEGATIVE 07/13/2022 0945   LEUKOCYTESUR NEGATIVE 07/13/2022 0945   Sepsis Labs: '@LABRCNTIP'$ (procalcitonin:4,lacticidven:4)  ) Recent Results (from the past 240 hour(s))  Resp panel by RT-PCR (RSV, Flu A&B, Covid) Anterior Nasal Swab     Status: None   Collection Time: 08/30/22  3:34 PM   Specimen: Anterior Nasal Swab  Result Value Ref Range Status   SARS Coronavirus 2 by RT PCR NEGATIVE NEGATIVE Final    Comment: (NOTE) SARS-CoV-2 target nucleic acids are NOT DETECTED.  The SARS-CoV-2 RNA is generally detectable in upper respiratory specimens during the acute phase of infection. The lowest concentration of SARS-CoV-2 viral copies this assay can detect is 138 copies/mL. A negative result does not preclude SARS-Cov-2 infection and should not be used  as the sole basis for treatment or other patient management decisions. A negative result may occur with  improper specimen collection/handling, submission of specimen other than nasopharyngeal swab, presence of viral mutation(s) within the areas targeted by this assay, and inadequate number of viral copies(<138 copies/mL). A negative result must be combined with clinical observations, patient history, and epidemiological information. The expected result is Negative.  Fact Sheet for Patients:  EntrepreneurPulse.com.au  Fact Sheet for Healthcare Providers:   IncredibleEmployment.be  This test is no t yet approved or cleared by the [place] FDA and  has been authorized for detection and/or diagnosis of SARS-CoV-2 by FDA under an Emergency Use Authorization (EUA). This EUA will remain  in effect (meaning this test can be used) for the duration of the COVID-19 declaration under Section 564(b)(1) of the Act, 21 U.S.C.section 360bbb-3(b)(1), unless the authorization is terminated  or revoked sooner.       Influenza A by PCR NEGATIVE NEGATIVE Final   Influenza B by PCR NEGATIVE NEGATIVE Final    Comment: (NOTE) The Xpert Xpress SARS-CoV-2/FLU/RSV plus assay is intended as an aid in the diagnosis of influenza from Nasopharyngeal swab specimens and should not be used as a sole basis for treatment. Nasal washings and aspirates are unacceptable for Xpert Xpress SARS-CoV-2/FLU/RSV testing.  Fact Sheet for Patients: EntrepreneurPulse.com.au  Fact Sheet for Healthcare Providers: IncredibleEmployment.be  This test is not yet approved or cleared by the [place] FDA and has been authorized for detection and/or diagnosis of SARS-CoV-2 by FDA under an Emergency Use Authorization (EUA). This EUA will remain in effect (meaning this test can be used) for the duration of the COVID-19 declaration under Section 564(b)(1) of the Act, 21 U.S.C. section 360bbb-3(b)(1), unless the authorization is terminated or revoked.     Resp Syncytial Virus by PCR NEGATIVE NEGATIVE Final    Comment: (NOTE) Fact Sheet for Patients: EntrepreneurPulse.com.au  Fact Sheet for Healthcare Providers: IncredibleEmployment.be  This test is not yet approved or cleared by the [place] FDA and has been authorized for detection and/or diagnosis of SARS-CoV-2 by FDA under an Emergency Use Authorization (EUA). This EUA will remain in effect (meaning this test can be used) for  the duration of the COVID-19 declaration under Section 564(b)(1) of the Act, 21 U.S.C. section 360bbb-3(b)(1), unless the authorization is terminated or revoked.  Performed at [facility], [place] [address], [place], [place] [address]   Group A Strep by PCR     Status: None   Collection Time: 08/31/22  5:12 PM   Specimen: Throat; Sterile Swab  Result Value Ref Range Status   Group A Strep by PCR NOT DETECTED NOT DETECTED Final    Comment: Performed at [facility], [place] [address], [place], [place] [address]     Radiology Studies: DG Chest Port 1 View  Result Date: 09/07/2022 CLINICAL DATA:  Shortness of breath EXAM: PORTABLE CHEST 1 VIEW COMPARISON:  08/30/2022 FINDINGS: Cardiac shadow is enlarged but stable. Right PICC is noted at the cavoatrial junction. The lungs are well aerated. Minimal atelectatic changes in the left base are seen. No other focal abnormality is noted. IMPRESSION: Mild left basilar atelectasis. Electronically Signed   By: Inez Catalina M.D.   On: 09/07/2022 20:26     Scheduled Meds:  amiodarone  200 mg Oral Daily   apixaban  2.5 mg Oral BID   azaTHIOprine  100 mg Oral Daily   bumetanide  1 mg Oral Daily   Chlorhexidine  Gluconate Cloth  6 each Topical Daily   folic acid  1 mg Oral Daily   hydrocerin   Topical Daily   lidocaine  1 patch Transdermal Q24H   pantoprazole  80 mg Oral Daily   predniSONE  5 mg Oral Q breakfast   sodium bicarbonate  650 mg Oral Daily   sodium chloride flush  10-40 mL Intracatheter Q12H   sodium chloride flush  3 mL Intravenous Q12H   Vericiguat  10 mg Oral Daily   Continuous Infusions:     LOS: 9 days    Time spent: 41mn    PDomenic Polite MD [facility]   09/08/2022, 7:50 PM

## 2022-09-08 NOTE — Progress Notes (Signed)
This chaplain responded to PMT NP-Mary's consult for spiritual care. The Pt. is accepting of the visit after moving to the bedside recliner with OT.   The chaplain listened reflectively to the Pt. story telling. The chaplain understands the Pt. is emotionally unsettled with the physical limitations and  SOB she is experiencing with her new diagnosis. The Pt. shares she looks forward to experiencing life with her three grandchildren and death is the topic of discussion she often steps away from.  The chaplain offered continued education in defining the difference of Palliative and Hospice Care. In this context, the chaplain offered the Pt. the benefits in her choice of exploring communication with her family and medical team as she sorts through life together and her faith.  The Pt. was appreciative of the visit and hopeful for a return visit. The chaplain accepted the Pt. request for prayer.  Chaplain Sallyanne Kuster [phone]

## 2022-09-08 NOTE — Evaluation (Signed)
Occupational Therapy Evaluation Patient Details Name: Tracy Nelson MRN: [id number] DOB: [birth year] Today's Date: 09/08/2022   History of Present Illness 77 y/o female who presents on 1/21 with cough, SOB, weakness. CXR- small bilateral pleural effusions. Admitted with acute on chronic HF, VQ scan negative for PE.  PMH includes HF, nonischemic cardiomyopathy, RA, CKD IV, recent IM nail hip 07/10/22, HTN.   Clinical Impression   PTA patient reports independent with most ADLs (daughter assists with shower), using rollator for mobility. Admitted for above and presents with problem list below.  Pt demonstrating ability to complete bed mobility with min assist, transfers and short distance mobility using RW with min guard, and ADLs with up to min assist.  She fatigues easily and is limited by decreased strength and tolerance. VSS during session.  She reports being alone during the day, but is looking for someone to stay with her.  She will benefit from continued OT services acutely and after dc at [facility] level to optimize independence, safety and return to PLOF. Will follow.      Recommendations for follow up therapy are one component of a multi-disciplinary discharge planning process, led by the attending physician.  Recommendations may be updated based on patient status, additional functional criteria and insurance authorization.   Follow Up Recommendations  Home health OT     Assistance Recommended at Discharge Frequent or constant Supervision/Assistance  Patient can return home with the following A little help with walking and/or transfers;A little help with bathing/dressing/bathroom;Assistance with cooking/housework;Assist for transportation;Help with stairs or ramp for entrance    Functional Status Assessment  Patient has had a recent decline in their functional status and demonstrates the ability to make significant improvements in function in a reasonable and predictable amount of time.   Equipment Recommendations  None recommended by OT    Recommendations for Other Services       Precautions / Restrictions Precautions Precautions: Fall Restrictions Weight Bearing Restrictions: Yes LLE Weight Bearing: Weight bearing as tolerated      Mobility Bed Mobility Overal bed mobility: Needs Assistance Bed Mobility: Supine to Sit     Supine to sit: Min assist     General bed mobility comments: trunk support to ascend to EOB    Transfers Overall transfer level: Needs assistance Equipment used: Rolling walker (2 wheels) Transfers: Sit to/from Stand Sit to Stand: Min guard           General transfer comment: for safety, pt pull up on RW after cueing to push      Balance Overall balance assessment: Needs assistance Sitting-balance support: Feet supported Sitting balance-Leahy Scale: Good     Standing balance support: Bilateral upper extremity supported, During functional activity Standing balance-Leahy Scale: Fair Standing balance comment: relies on BUE support                           ADL either performed or assessed with clinical judgement   ADL Overall ADL's : Needs assistance/impaired     Grooming: Set up;Sitting           Upper Body Dressing : Set up;Sitting   Lower Body Dressing: Minimal assistance;Sit to/from stand   Toilet Transfer: Min guard;Ambulation;Rolling walker (2 wheels) Toilet Transfer Details (indicate cue type and reason): to recliner         Functional mobility during ADLs: Min guard;Rolling walker (2 wheels);Minimal assistance       Vision   Vision Assessment?: No apparent  visual deficits     Perception     Praxis      Pertinent Vitals/Pain Pain Assessment Pain Assessment: Faces Faces Pain Scale: No hurt Pain Intervention(s): Monitored during session, Repositioned     Hand Dominance Right   Extremity/Trunk Assessment             Communication Communication Communication: No  difficulties   Cognition Arousal/Alertness: Awake/alert Behavior During Therapy: WFL for tasks assessed/performed Overall Cognitive Status: Within Functional Limits for tasks assessed                                       General Comments       Exercises     Shoulder Instructions      Home Living Family/patient expects to be discharged to:: Private residence Living Arrangements: Children Available Help at Discharge: Family;Available PRN/intermittently Type of Home: House Home Access: Level entry     Home Layout: Two level;Able to live on main level with bedroom/bathroom     Bathroom Shower/Tub: Teacher, early years/pre: Standard     Home Equipment: Conservation officer, nature (2 wheels);Rollator (4 wheels);BSC/3in1   Additional Comments: Lives with daughter and her family - she is typically alone during the day. Reports trying to find someone to stay with her during the day when she is alone.      Prior Functioning/Environment Prior Level of Function : Independent/Modified Independent             Mobility Comments: reports using rollator for mobility ADLs Comments: daughter assists with showers, independent all other ADLs.  family assists with IADLS        OT Problem List: Decreased strength;Impaired balance (sitting and/or standing);Decreased activity tolerance;Decreased knowledge of precautions;Decreased knowledge of use of DME or AE      OT Treatment/Interventions: Self-care/ADL training;Therapeutic exercise;DME and/or AE instruction;Energy conservation;Therapeutic activities;Patient/family education;Balance training    OT Goals(Current goals can be found in the care plan section) Acute Rehab OT Goals Patient Stated Goal: home OT Goal Formulation: With patient Time For Goal Achievement: 09/22/22 Potential to Achieve Goals: Good  OT Frequency: Min 2X/week    Co-evaluation              AM-PAC OT "6 Clicks" Daily Activity     Outcome  Measure Help from another person eating meals?: None Help from another person taking care of personal grooming?: A Little Help from another person toileting, which includes using toliet, bedpan, or urinal?: A Little Help from another person bathing (including washing, rinsing, drying)?: A Little Help from another person to put on and taking off regular upper body clothing?: A Little Help from another person to put on and taking off regular lower body clothing?: A Little 6 Click Score: 19   End of Session Equipment Utilized During Treatment: Rolling walker (2 wheels) Nurse Communication: Mobility status  Activity Tolerance: Patient tolerated treatment well Patient left: in chair;with call bell/phone within reach;Other (comment) (chaplin services present)  OT Visit Diagnosis: Other abnormalities of gait and mobility (R26.89);Muscle weakness (generalized) (M62.81);History of falling (Z91.81)                Time: 4650-3546 OT Time Calculation (min): 19 min Charges:  OT General Charges $OT Visit: 1 Visit OT Evaluation $OT Eval Moderate Complexity: 1 Mod  Jolaine Artist, OT Acute Rehabilitation Services Office [phone]   Delight Stare 09/08/2022, 10:56 AM

## 2022-09-08 NOTE — TOC Progression Note (Signed)
Transition of Care [facility]) - Progression Note    Patient Details  Name: Tracy Nelson MRN: [id number] Date of Birth: 1946/04/03  [facility] CM/SW Contact  Marcheta Grammes Rexene Alberts, RN Phone Number: 09/08/2022, 4:51 PM  Clinical Narrative:     HF TOC CM spoke to pt's dtr, and states pt has needed DME in the home, Rollator, RW, wheelchair, and bedside commode. HH is arranged with [facility].   Expected Discharge Plan: [place] Barriers to Discharge: Continued Medical Work up  Expected Discharge Plan and Services In-house Referral: NA Discharge Planning Services: CM Consult Post Acute Care Choice: NA Living arrangements for the past 2 months: Single Family Home                 DME Arranged: N/A DME Agency: NA       HH Arranged: RN, PT, OT HH Agency: [place] Date [place]: 09/04/22 Time [place]: 1627 Representative spoke with at [place]: [place] Determinants of Health (SDOH) Interventions SDOH Screenings   Food Insecurity: No Food Insecurity (08/31/2022)  Housing: Low Risk  (08/31/2022)  Transportation Needs: No Transportation Needs (08/31/2022)  Recent Concern: Transportation Needs - Unmet Transportation Needs (07/09/2022)  Utilities: Not At Risk (08/31/2022)  Depression (PHQ2-9): Low Risk  (08/27/2022)  Financial Resource Strain: Low Risk  (04/30/2022)  Physical Activity: Inactive (04/30/2022)  Stress: No Stress Concern Present (04/30/2022)  Tobacco Use: Low Risk  (09/03/2022)    Readmission Risk Interventions    08/31/2022   12:35 PM  Readmission Risk Prevention Plan  Transportation Screening Complete  HRI or [place] Complete  Palliative Care Screening Not Applicable  Medication Review (RN Care Manager) Complete

## 2022-09-09 DIAGNOSIS — R531 Weakness: Secondary | ICD-10-CM | POA: Diagnosis not present

## 2022-09-09 DIAGNOSIS — Z7189 Other specified counseling: Secondary | ICD-10-CM | POA: Diagnosis not present

## 2022-09-09 DIAGNOSIS — N184 Chronic kidney disease, stage 4 (severe): Secondary | ICD-10-CM | POA: Diagnosis not present

## 2022-09-09 DIAGNOSIS — I5023 Acute on chronic systolic (congestive) heart failure: Secondary | ICD-10-CM | POA: Diagnosis not present

## 2022-09-09 LAB — BASIC METABOLIC PANEL
Anion gap: 12 (ref 5–15)
BUN: 60 mg/dL — ABNORMAL HIGH (ref 8–23)
CO2: 22 mmol/L (ref 22–32)
Calcium: 8.7 mg/dL — ABNORMAL LOW (ref 8.9–10.3)
Chloride: 101 mmol/L (ref 98–111)
Creatinine, Ser: 2.89 mg/dL — ABNORMAL HIGH (ref 0.44–1.00)
GFR, Estimated: 16 mL/min — ABNORMAL LOW (ref >60)
Glucose, Bld: 106 mg/dL — ABNORMAL HIGH (ref 70–99)
Potassium: 4.4 mmol/L (ref 3.5–5.1)
Sodium: 135 mmol/L (ref 135–145)

## 2022-09-09 LAB — CBC
HCT: 28.5 % — ABNORMAL LOW (ref 36.0–46.0)
Hemoglobin: 8.7 g/dL — ABNORMAL LOW (ref 12.0–15.0)
MCH: 29.6 pg (ref 26.0–34.0)
MCHC: 30.5 g/dL (ref 30.0–36.0)
MCV: 96.9 fL (ref 80.0–100.0)
Platelets: 255 10*3/uL (ref 150–400)
RBC: 2.94 MIL/uL — ABNORMAL LOW (ref 3.87–5.11)
RDW: 23.3 % — ABNORMAL HIGH (ref 11.5–15.5)
WBC: 6.6 10*3/uL (ref 4.0–10.5)
nRBC: 0 % (ref 0.0–0.2)

## 2022-09-09 LAB — COOXEMETRY PANEL
Carboxyhemoglobin: 1.8 % — ABNORMAL HIGH (ref 0.5–1.5)
Methemoglobin: <0.7 % (ref 0.0–1.5)
O2 Saturation: 60.1 %
Total hemoglobin: 8.9 g/dL — ABNORMAL LOW (ref 12.0–16.0)

## 2022-09-09 LAB — MAGNESIUM: Magnesium: 2.1 mg/dL (ref 1.7–2.4)

## 2022-09-09 MED ORDER — BUMETANIDE 1 MG PO TABS
1.0000 mg | ORAL_TABLET | Freq: Every day | ORAL | Status: DC
  Administered 2022-09-09 – 2022-09-10 (×2): 1 mg via ORAL
  Filled 2022-09-09 (×2): qty 1

## 2022-09-09 MED ORDER — POLYVINYL ALCOHOL 1.4 % OP SOLN
1.0000 [drp] | OPHTHALMIC | Status: DC | PRN
  Filled 2022-09-09: qty 15

## 2022-09-09 MED ORDER — CYCLOSPORINE 0.05 % OP EMUL
1.0000 [drp] | Freq: Two times a day (BID) | OPHTHALMIC | Status: DC
  Administered 2022-09-09 – 2022-09-11 (×5): 1 [drp] via OPHTHALMIC
  Filled 2022-09-09 (×6): qty 30

## 2022-09-09 MED ORDER — BUMETANIDE 2 MG PO TABS
2.0000 mg | ORAL_TABLET | Freq: Every day | ORAL | Status: DC
  Filled 2022-09-09: qty 1

## 2022-09-09 NOTE — Progress Notes (Signed)
[name], female   DOB: 1946-07-10, 77 y.o.   MRN: [id number]    Progress Note from the Palliative Medicine Team at [facility]   Patient Name: Tracy Nelson        Date: 09/09/2022 DOB: Dec 15, 1945  Age: 77 y.o. MRN#: [id number] Attending Physician: Domenic Polite, MD Primary Care Physician: [name], [place]. Admit Date: 08/30/2022   Medical records reviewed, discussed with treatment team  77 y.o. female   admitted on 08/30/2022 with past  medical history significant for HFrEF (EF improved to 45-50% 07/11/2022), CKD stage IV, paroxysmal A-fib/flutter on Eliquis, anemia of chronic disease, rheumatoid arthritis on chronic immunosuppressants, left hip fracture s/p IM nail 07/10/2022.     End stage heart failure, CKD.     Patient faces treatment option decisions, advanced directive decisions and anticipatory care needs.  This NP assessed patient at the bedside as a follow up for palliative medicine needs and emotional support.  Secured Buyer, retail on telephone.     Continued education at the bedside regarding current medical situation.  Education offered on the seriousness of patient's current medical situation as it relates to end-stage heart failure and chronic kidney disease. Currently she is stable off IV milrinone.  Cardiology feels she is optimized from a cardiac standpoint  Discussion with patient and her daughter anticipated discharge in the next few.  Daughter verbalizes that she is looking into securing in-home aides to support Ms. Cleary at home.  Education offered on hospice benefit; philosophy and eligibility.  Patient does meet hospice criteria however at this time both patient and family have no interest in hospice services.  She is high risk for decompensation  Education offered today regarding  the importance of continued conversation with her family and the medical providers regarding overall plan of care and treatment options,  ensuring  decisions are within the context of the patients values and GOCs.        Again encouraged to secure advanced care planning documents.  Discussed Code status.  Educated patient/family to consider DNR/DNI status understanding evidenced based poor outcomes in similar hospitalized patient, as the cause of arrest is likely associated with advanced chronic illness rather than an easily reversible acute cardio-pulmonary event.  Questions and concerns addressed     PMT will continue to support holistically   Total time spent on the unit was 65 minutes    Wadie Lessen NP  Palliative Medicine Team Team Phone # [phone] Pager [phone]

## 2022-09-09 NOTE — Progress Notes (Addendum)
Advanced Heart Failure Rounding Note  PCP-Cardiologist: None   Subjective:   RHC 1/25 on milrinone .25 RA mean 10 PA 48/14 mean 29 PCWP mean 17 Cardiac Output (Fick) 4.22  Cardiac Index (Fick) 2.81 PVR 2.8 WU PAPi 3.4    Transitioned to PO Bumex 1/30, CO-OX 60%   Feels ok this morning. Denies CP. Still with intermittent SOB, didn't get good rest yesterday.   Objective:   Weight Range: 51.1 kg Body mass index is 19.96 kg/m.   Vital Signs:   Temp:  [98.2 F (36.8 C)-99 F (37.2 C)] 98.2 F (36.8 C) (01/31 0507) Pulse Rate:  [86-88] 86 (01/31 0507) Resp:  [16-20] 20 (01/31 0507) BP: (95-106)/(66-84) 106/84 (01/31 0507) SpO2:  [98 %-99 %] 99 % (01/31 0507) Weight:  [51.1 kg] 51.1 kg (01/31 0507) Last BM Date : 09/06/22  Weight change: Filed Weights   09/07/22 0400 09/08/22 0405 09/09/22 0507  Weight: 49.3 kg 50.1 kg 51.1 kg    Intake/Output:   Intake/Output Summary (Last 24 hours) at 09/09/2022 0821 Last data filed at 09/09/2022 0500 Gross per 24 hour  Intake 360 ml  Output 620 ml  Net -260 ml     CVP 7 Physical Exam  General:  chronically ill, elderly appearing.  No respiratory difficulty HEENT: normal Neck: supple. JVD ~8 cm. Carotids 2+ bilat; no bruits. No lymphadenopathy or thyromegaly appreciated. Cor: PMI nondisplaced. Regular rate & rhythm. No rubs, gallops or murmurs. Lungs: clear Abdomen: soft, nontender, nondistended. No hepatosplenomegaly. No bruits or masses. Good bowel sounds. Extremities: no cyanosis, clubbing, rash, edema. PICC RUE Neuro: alert & oriented x 3, cranial nerves grossly intact. moves all 4 extremities w/o difficulty. Affect pleasant.   Telemetry  NSR 80s (Personally reviewed)    EKG    No new EKG to review  Labs    CBC Recent Labs    09/09/22 0512  WBC 6.6  HGB 8.7*  HCT 28.5*  MCV 96.9  PLT 932   Basic Metabolic Panel Recent Labs    09/08/22 0407 09/09/22 0512  NA 135 135  K 4.5 4.4  CL 102 101   CO2 24 22  GLUCOSE 104* 106*  BUN 59* 60*  CREATININE 2.85* 2.89*  CALCIUM 8.9 8.7*  MG 2.0 2.1   Liver Function Tests No results for input(s): "AST", "ALT", "ALKPHOS", "BILITOT", "PROT", "ALBUMIN" in the last 72 hours.  No results for input(s): "LIPASE", "AMYLASE" in the last 72 hours. Cardiac Enzymes No results for input(s): "CKTOTAL", "CKMB", "CKMBINDEX", "TROPONINI" in the last 72 hours.  BNP: BNP (last 3 results) Recent Labs    08/27/22 1658 08/30/22 1409 09/08/22 0407  BNP >5000.0* >4,500.0* 2,633.7*    Imaging    No results found.   Medications:     Scheduled Medications:  amiodarone  200 mg Oral Daily   apixaban  2.5 mg Oral BID   azaTHIOprine  100 mg Oral Daily   bumetanide  1 mg Oral Daily   Chlorhexidine Gluconate Cloth  6 each Topical Daily   folic acid  1 mg Oral Daily   hydrocerin   Topical Daily   lidocaine  1 patch Transdermal Q24H   pantoprazole  80 mg Oral Daily   predniSONE  5 mg Oral Q breakfast   sodium bicarbonate  650 mg Oral Daily   sodium chloride flush  10-40 mL Intracatheter Q12H   sodium chloride flush  3 mL Intravenous Q12H   Vericiguat  10 mg Oral Daily  Infusions:    PRN Medications: acetaminophen **OR** acetaminophen, alum & mag hydroxide-simeth, guaiFENesin-dextromethorphan, ondansetron **OR** ondansetron (ZOFRAN) IV, senna-docusate, sodium chloride flush    Patient Profile   Tracy Nelson is a 77 y.o. AAF with history of rheumatoid arthritis, atrial fibrillation, CKD stage 3, post-herpetic neuralgia (shingles L upper back 3/23) and nonischemic cardiomyopathy. 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    Assessment/Plan   1. 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   - Echo 07/2022 EF improved 45-50%. RV normal  - RHC 12/21 low filling pressures and relatively preserved cardiac index at 2.22. - Echo 09/01/22 EF < 20% RV severely  reduced - NYHA class IIIb/IV on admission - Continue midodrine 5 mg tid.  - Continue Verquvo 10 mg daily.  - Repeat TTE on 09/01/22 with severe biventricular failure - hip repair ?nail precludes her from CMR.  - Suspect she is end-stage.  - CO-OX stable off milrinone.  - CVP 7 today. Minimal UOP on Bumex '1mg'$ , will continue to monitor, may need dose increased vs alternate diuretic - Renal function ok.    2. AKI on CKD IV - She is followed by Dr. Royce Macadamia. - Baseline 2.4-2.6?Marland Kitchen Most recently her baseline has been around 2.8-3.3 - Creatinine peaked 3.24--> today 2.89   3. Rheumatoid arthritis: No history of lung involvement. On low dose of prednisone chronically.    4. Atrial fibrillation/flutter: S/p TEE-DCCV in 4/22.   - Maintaining SR.  - Continue amio 200 mg daily.  - LFTs and TSH normal (11/23). - Continue Eliquis 2.5 mg twice a day. On lower dose with weight/creatinine   5. Shingles: Resolved.    6. Left Hip Fracture - S/P IM Nail 07/2022 - PT/OT-->HH. Plan to go back home with her daughter.    7. Swollen lymph nodes - primarily L side - white patches seen in back of throat - discussed with primary - strep (-)   8. Microcytic anemia - 2/2 plaquenil? - per primary - Hgb 7 on admission. s/p 1uPRBc 09/02/22 - Got feraheme 09/01/22    Length of Stay: [place], NP  09/09/2022, 8:21 AM  Advanced Heart Failure Team Pager [phone] (M-F; 7a - 5p)  Please contact [facility] for night-coverage after hours (5p -7a ) and weekends on [email or web address]  Patient seen with NP, agree with the above note.   She had some dyspnea overnight, no complaints currently.  CVP 6, co-ox 60%.  Creatinine stable at 2.89.   General: NAD Neck: No JVD, no thyromegaly or thyroid nodule.  Lungs: Clear to auscultation bilaterally with normal respiratory effort. CV: Nondisplaced PMI.  Heart regular S1/S2, no S3/S4, no murmur.  No peripheral edema.   Abdomen: Soft, nontender, no  hepatosplenomegaly, no distention.  Skin: Intact without lesions or rashes.  Neurologic: Alert and oriented x 3.  Psych: Normal affect. Extremities: No clubbing or cyanosis.  HEENT: Normal.   Volume status looks optimized at this point with CVP 6.  She is on home bumetanide 1 mg daily.  Co-ox is stable off milrinone at 60%.  Continue Verquvo, no other GDMT options at this point with relatively low BP and AKI on CKD 3.  Creatinine is now trending back to baseline. Not candidate for advanced therapies.     Remains in NSR on amiodarone and apixaban.    Frail, needs ongoing PT.    In terms of cardiac optimization, I think she is about as good  as we will be able to get her.  Ready for discharge soon.   Loralie Champagne 09/09/2022 1:00 PM

## 2022-09-09 NOTE — Progress Notes (Signed)
Physical Therapy Treatment Patient Details Name: Tracy Nelson MRN: [id number] DOB: 25-Mar-1946 Today's Date: 09/09/2022   History of Present Illness 77 y/o female who presents on 1/21 with cough, SOB, weakness. CXR- small bilateral pleural effusions. Admitted with acute on chronic HF, VQ scan negative for PE.  PMH includes HF, nonischemic cardiomyopathy, RA, CKD IV, recent IM nail L hip 07/10/22, HTN.    PT Comments    Pt reporting fatigue and states her L eye is dry/bothering her, but agreeable to out of chair mobility. Pt ambulatory for short hallway distance with rollator, requires min cues for safe use of rollator at this time. Pt progressing well, will continue to follow.      Recommendations for follow up therapy are one component of a multi-disciplinary discharge planning process, led by the attending physician.  Recommendations may be updated based on patient status, additional functional criteria and insurance authorization.  Follow Up Recommendations  Home health PT     Assistance Recommended at Discharge Frequent or constant Supervision/Assistance  Patient can return home with the following A little help with walking and/or transfers;A little help with bathing/dressing/bathroom;Assistance with cooking/housework;Assist for transportation;Help with stairs or ramp for entrance   Equipment Recommendations  None recommended by PT    Recommendations for Other Services       Precautions / Restrictions Precautions Precautions: Fall Restrictions Weight Bearing Restrictions: Yes LLE Weight Bearing: Weight bearing as tolerated     Mobility  Bed Mobility Overal bed mobility: Needs Assistance             General bed mobility comments: up in chair with help of staff    Transfers Overall transfer level: Needs assistance Equipment used: Rollator (4 wheels) Transfers: Sit to/from Stand Sit to Stand: Supervision           General transfer comment: for safety, cues  for locking/unlocking and hand placement    Ambulation/Gait Ambulation/Gait assistance: Supervision Gait Distance (Feet): 100 Feet Assistive device: Rolling walker (2 wheels) Gait Pattern/deviations: Decreased stride length, Wide base of support, Decreased weight shift to left, Step-through pattern Gait velocity: decr     General Gait Details: cues for upright posture, proximity to rollator   Stairs             Wheelchair Mobility    Modified Rankin (Stroke Patients Only)       Balance Overall balance assessment: Needs assistance Sitting-balance support: Feet supported Sitting balance-Leahy Scale: Good     Standing balance support: Bilateral upper extremity supported, During functional activity Standing balance-Leahy Scale: Fair Standing balance comment: relies on BUE support                            Cognition Arousal/Alertness: Awake/alert Behavior During Therapy: WFL for tasks assessed/performed Overall Cognitive Status: Within Functional Limits for tasks assessed                                          Exercises      General Comments        Pertinent Vitals/Pain Pain Assessment Pain Assessment: Faces Faces Pain Scale: Hurts a little bit Pain Location: L hip Pain Descriptors / Indicators: Guarding Pain Intervention(s): Limited activity within patient's tolerance, Monitored during session, Repositioned    Home Living  Prior Function            PT Goals (current goals can now be found in the care plan section) Acute Rehab PT Goals Patient Stated Goal: to return home PT Goal Formulation: With patient Time For Goal Achievement: 09/23/22 Potential to Achieve Goals: Good Progress towards PT goals: Progressing toward goals    Frequency    Min 3X/week      PT Plan Current plan remains appropriate    Co-evaluation              AM-PAC PT "6 Clicks" Mobility    Outcome Measure  Help needed turning from your back to your side while in a flat bed without using bedrails?: None Help needed moving from lying on your back to sitting on the side of a flat bed without using bedrails?: None Help needed moving to and from a bed to a chair (including a wheelchair)?: None Help needed standing up from a chair using your arms (e.g., wheelchair or bedside chair)?: A Little Help needed to walk in hospital room?: A Little Help needed climbing 3-5 steps with a railing? : A Little 6 Click Score: 21    End of Session   Activity Tolerance: Patient limited by fatigue Patient left: in chair;with call bell/phone within reach (pt states she will press call button and wait for assist prior to mobilizing out of chair, pt up in chair upon PT arrival to room and not on chair alarm) Nurse Communication: Mobility status PT Visit Diagnosis: Muscle weakness (generalized) (M62.81);Difficulty in walking, not elsewhere classified (R26.2)     Time: 6606-3016 PT Time Calculation (min) (ACUTE ONLY): 17 min  Charges:  $Therapeutic Activity: 8-22 mins                     Stacie Glaze, PT DPT Acute Rehabilitation Services Pager [phone]  Office [phone]    Roxine Caddy E Ruffin Pyo 09/09/2022, 4:17 PM

## 2022-09-10 DIAGNOSIS — I5023 Acute on chronic systolic (congestive) heart failure: Secondary | ICD-10-CM | POA: Diagnosis not present

## 2022-09-10 LAB — COOXEMETRY PANEL
Carboxyhemoglobin: 1.2 % (ref 0.5–1.5)
Methemoglobin: <0.7 % (ref 0.0–1.5)
O2 Saturation: 64.1 %
Total hemoglobin: 9 g/dL — ABNORMAL LOW (ref 12.0–16.0)

## 2022-09-10 LAB — CBC
HCT: 27.8 % — ABNORMAL LOW (ref 36.0–46.0)
Hemoglobin: 8.7 g/dL — ABNORMAL LOW (ref 12.0–15.0)
MCH: 30.2 pg (ref 26.0–34.0)
MCHC: 31.3 g/dL (ref 30.0–36.0)
MCV: 96.5 fL (ref 80.0–100.0)
Platelets: 253 10*3/uL (ref 150–400)
RBC: 2.88 MIL/uL — ABNORMAL LOW (ref 3.87–5.11)
RDW: 23 % — ABNORMAL HIGH (ref 11.5–15.5)
WBC: 6.8 10*3/uL (ref 4.0–10.5)
nRBC: 0 % (ref 0.0–0.2)

## 2022-09-10 LAB — BASIC METABOLIC PANEL
Anion gap: 11 (ref 5–15)
BUN: 64 mg/dL — ABNORMAL HIGH (ref 8–23)
CO2: 25 mmol/L (ref 22–32)
Calcium: 9 mg/dL (ref 8.9–10.3)
Chloride: 101 mmol/L (ref 98–111)
Creatinine, Ser: 3.23 mg/dL — ABNORMAL HIGH (ref 0.44–1.00)
GFR, Estimated: 14 mL/min — ABNORMAL LOW (ref >60)
Glucose, Bld: 107 mg/dL — ABNORMAL HIGH (ref 70–99)
Potassium: 3.8 mmol/L (ref 3.5–5.1)
Sodium: 137 mmol/L (ref 135–145)

## 2022-09-10 MED ORDER — BUMETANIDE 1 MG PO TABS: 1.0000 mg | ORAL_TABLET | Freq: Every day | ORAL | Status: DC

## 2022-09-10 NOTE — Progress Notes (Signed)
PROGRESS NOTE    Tracy Nelson  OAC:[id number] DOB: [birth year] DOA: 08/30/2022 PCP: [name], [name]  77/F, chronically ill with chronic systolic CHF, NICM, EF 01-09% recently improved to 45%, CKD 4, paroxysmal A-fib/A-flutter, anemia of chronic disease, rheumatoid arthritis on Plaquenil and azathioprine, left hip fracture treated with IM nail 07/10/2022, postop went to rehab and was discharged home 5 days ago. -Presented to the ED with progressive dyspnea on exertion, orthopnea and cough, Compliant with Bumex daily, took an extra dose day prior to admission -In the ED vital stable, hemoglobin 7.3, creatinine 2.9, bicarb 16, BNP> 4500, D-dimer 1.95, troponin 51 -Chest x-ray with small bilateral pleural effusions -Admitted, started on IV Lasix, IV heparin, VQ scan negative -1/22: Transfused PRBC, Lasix X1 -1/23 low output heart failure, started on milrinone infusion, 0.25 mcg/kg/min. -1/25 right heart catheterization with mild elevation of right and left heart filling pressures with mild venous hypertension, good cardiac output on milrinone.  -1/26 milrinone down  0.125 mcg/kg/min -1/27 patient with hypotensive and hypoperfusion on lower dose of milrinone drip. Poor prognosis, end stage heart failure. Palliative consulted -1/28 continue with inotropic support with milrinone.  -1/29 discontinue milrinone.  -1/30  resumed oral Bumex 2/1: Creatinine trending up again, 3.2  Subjective: -Less dyspneic last night  Assessment and Plan:  Acute on chronic HFrEF -Known NICM, previously EF 20-25%, last echo 12/23 with improvement to 32-35%, grade 1 diastolic dysfunction, normal RV -Followed by Dr. Aundra Dubin with advanced heart failure team, complicated by CKD 4 and hypotension limiting GDMT -ECHO 1/23 w/ EF <20% with severe RV dysfunction -started on milrinone 1/23 -now off milrinone, poor candidate for advanced therapies -CHF team following, Palliative consulting, high risk of decline, quick  readmission, patient wishes to remain full code with full scope of treatment, creatinine trending up again, hold tomorrow's diuretic dose, received Bumex this morning  Macrocytic anemia -Hemoglobin has trended down from 9-10 range over the last 3 months to 7 range now, she denies any overt bleeding or blood loss, on Eliquis at baseline -Heme notes 8/22 reviewed, 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, Plaquenil can also cause bone marrow suppression, per rheum notes 2/23-Plaquenil discontinued at that follow-up, unfortunately patient continued taking this and did not know it was supposed to be stopped -Saw hematology 8/22, noted to have M spike then, 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 Royce Macadamia.   Elevated d-dimer -nonspecific in the setting of long history of RA, VQ scan negative, heparin discontinued, Eliquis resumed  Paroxysmal atrial fibrillation (HCC) -Sinus rhythm at this time, continue amiodarone and IV heparin as above.  Rheumatoid arthritis involving multiple sites with positive rheumatoid factor (HCC) On chronic immunosuppressants.  Continue prednisone, Imuran. -No indication for stress dose steroids -Reviewed rheumatology notes, Dr. Benjamine Mola 2/23 and discontinued Plaquenil, patient unfortunately patient continue to take this for 11 months after DC  DVT prophylaxis: apixaban Code Status: Full code Family Communication: None present, called and updated daughter leafy Disposition Plan: Home pending improvement in kidney function  Consultants: CHF team, Palliative   Procedures:   Antimicrobials:    Objective: Vitals:   09/09/22 0938 09/09/22 1118 09/09/22 1559 09/10/22 0345  BP:  103/83 92/66 100/72 104/80  Pulse: 83 81 85  86  Resp: '18 18 18 18  '$ Temp: 98.2 F (36.8 C) 98 F (36.7 C) 98 F (36.7 C) 98 F (36.7 C)  TempSrc: Oral Oral Oral Oral  SpO2: 99% 99% 100% 100%  Weight:    48.9 kg  Height:        Intake/Output Summary (Last 24 hours) at 09/10/2022 1111 Last data filed at 09/09/2022 2248 Gross per 24 hour  Intake 680 ml  Output --  Net 680 ml   Filed Weights   09/08/22 0405 09/09/22 0507 09/10/22 0345  Weight: 50.1 kg 51.1 kg 48.9 kg    Examination:  Chronically ill female sitting up in bed, AAOx3, cushingoid facies HEENT: No JVD CVS: S1-S2, regular rhythm Lungs: Decreased breath sounds at the bases Abdomen: Soft, nontender, bowel sounds present Extremities: No edema    Data Reviewed:   CBC: Recent Labs  Lab 09/03/22 1609 09/03/22 1610 09/09/22 0512 09/10/22 0537  WBC  --   --  6.6 6.8  HGB 9.5* 10.2* 8.7* 8.7*  HCT 28.0* 30.0* 28.5* 27.8*  MCV  --   --  96.9 96.5  PLT  --   --  255 742   Basic Metabolic Panel: Recent Labs  Lab 09/05/22 0226 09/06/22 0516 09/07/22 0607 09/08/22 0407 09/09/22 0512 09/10/22 0537  NA 134* 136 136 135 135 137  K 3.8 3.3* 3.5 4.5 4.4 3.8  CL 100 103 102 102 101 101  CO2 '24 23 24 24 22 25  '$ GLUCOSE 126* 126* 105* 104* 106* 107*  BUN 64* 64* 61* 59* 60* 64*  CREATININE 3.37* 3.13* 3.24* 2.85* 2.89* 3.23*  CALCIUM 8.8* 8.4* 8.8* 8.9 8.7* 9.0  MG 2.0 1.9 2.1 2.0 2.1  --    GFR: Estimated Creatinine Clearance: 11.3 mL/min (A) (by C-G formula based on SCr of 3.23 mg/dL (H)). Liver Function Tests: No results for input(s): "AST", "ALT", "ALKPHOS", "BILITOT", "PROT", "ALBUMIN" in the last 168 hours.  No results for input(s): "LIPASE", "AMYLASE" in the last 168 hours. No results for input(s): "AMMONIA" in the last 168 hours. Coagulation Profile: No results for input(s): "INR", "PROTIME" in the last 168 hours. Cardiac Enzymes: No results for input(s): "CKTOTAL", "CKMB", "CKMBINDEX", "TROPONINI"  in the last 168 hours. BNP (last 3 results) No results for input(s): "PROBNP" in the last 8760 hours. HbA1C: No results for input(s): "HGBA1C" in the last 72 hours. CBG: No results for input(s): "GLUCAP" in the last 168 hours. Lipid Profile: No results for input(s): "CHOL", "HDL", "LDLCALC", "TRIG", "CHOLHDL", "LDLDIRECT" in the last 72 hours. Thyroid Function Tests: No results for input(s): "TSH", "T4TOTAL", "FREET4", "T3FREE", "THYROIDAB" in the last 72 hours. Anemia Panel: No results for input(s): "VITAMINB12", "FOLATE", "FERRITIN", "TIBC", "IRON", "RETICCTPCT" in the last 72 hours.  Urine analysis:    Component Value Date/Time   COLORURINE YELLOW 07/13/2022 0945   APPEARANCEUR CLEAR 07/13/2022 0945   LABSPEC 1.012 07/13/2022 0945   PHURINE 5.0 07/13/2022 0945   GLUCOSEU NEGATIVE 07/13/2022 0945   HGBUR NEGATIVE 07/13/2022 0945   BILIRUBINUR NEGATIVE 07/13/2022 0945   BILIRUBINUR negative 06/04/2021 1151   KETONESUR NEGATIVE 07/13/2022 0945   PROTEINUR NEGATIVE 07/13/2022 0945   UROBILINOGEN 0.2 06/04/2021 1151   NITRITE NEGATIVE 07/13/2022 0945   LEUKOCYTESUR NEGATIVE 07/13/2022 0945   Sepsis Labs: '@LABRCNTIP'$ (procalcitonin:4,lacticidven:4)  ) Recent Results (from the past 240 hour(s))  Group A Strep by PCR     Status: None   Collection Time: 08/31/22  5:12 PM   Specimen: Throat; Sterile Swab  Result Value Ref Range Status   Group A Strep by PCR NOT DETECTED NOT DETECTED Final    Comment: Performed at [facility], [place] [address] [place], [place] [address]     Radiology Studies: No results found.   Scheduled Meds:  amiodarone  200 mg Oral Daily   apixaban  2.5 mg Oral BID   azaTHIOprine  100 mg Oral Daily   [START ON 09/12/2022] bumetanide  1 mg Oral Daily   Chlorhexidine Gluconate Cloth  6 each Topical Daily   cycloSPORINE  1 drop Both Eyes BID   folic acid  1 mg Oral Daily   hydrocerin   Topical Daily   lidocaine  1 patch Transdermal Q24H    pantoprazole  80 mg Oral Daily   predniSONE  5 mg Oral Q breakfast   sodium bicarbonate  650 mg Oral Daily   sodium chloride flush  10-40 mL Intracatheter Q12H   sodium chloride flush  3 mL Intravenous Q12H   Vericiguat  10 mg Oral Daily   Continuous Infusions:     LOS: 11 days    Time spent: 19mn    PDomenic Polite MD [facility]   09/10/2022, 11:11 AM

## 2022-09-10 NOTE — Progress Notes (Addendum)
Advanced Heart Failure Rounding Note  PCP-Cardiologist: None   Subjective:   RHC 1/25 on milrinone .25 RA mean 10 PA 48/14 mean 29 PCWP mean 17 Cardiac Output (Fick) 4.22  Cardiac Index (Fick) 2.81 PVR 2.8 WU PAPi 3.4    Transitioned to PO Bumex 1/30, CO-OX 64%  No UOP documented yesterday, however per patient report she is making good urine and canister was full this morning. Will discuss with BSRN. CVP stable 6/7   Feels ok this morning. Denies CP. Still with intermittent SOB, didn't get good rest yesterday.   Objective:   Weight Range: 48.9 kg Body mass index is 19.08 kg/m.   Vital Signs:   Temp:  [98 F (36.7 C)-98.2 F (36.8 C)] 98 F (36.7 C) (02/01 0345) Pulse Rate:  [81-86] 86 (02/01 0345) Resp:  [18] 18 (02/01 0345) BP: (92-104)/(66-83) 104/80 (02/01 0345) SpO2:  [99 %-100 %] 100 % (02/01 0345) Weight:  [48.9 kg] 48.9 kg (02/01 0345) Last BM Date : 09/06/22  Weight change: Filed Weights   09/08/22 0405 09/09/22 0507 09/10/22 0345  Weight: 50.1 kg 51.1 kg 48.9 kg    Intake/Output:   Intake/Output Summary (Last 24 hours) at 09/10/2022 0936 Last data filed at 09/09/2022 2248 Gross per 24 hour  Intake 680 ml  Output --  Net 680 ml     CVP 6/7 Physical Exam  General:  elderly, chronically ill appearing.  No respiratory difficulty HEENT: normal Neck: supple. JVD 6 cm. Carotids 2+ bilat; no bruits. No lymphadenopathy or thyromegaly appreciated. Cor: PMI nondisplaced. Regular rate & rhythm. No rubs, gallops or murmurs. Lungs: clear, diminished bases Abdomen: soft, nontender, nondistended. No hepatosplenomegaly. No bruits or masses. Good bowel sounds. Extremities: no cyanosis, clubbing, rash, edema. PICC RUE Neuro: alert & oriented x 3, cranial nerves grossly intact. moves all 4 extremities w/o difficulty. Affect pleasant.   Telemetry  NSR 80s (Personally reviewed)    EKG    No new EKG to review  Labs    CBC Recent Labs    09/09/22 0512  09/10/22 0537  WBC 6.6 6.8  HGB 8.7* 8.7*  HCT 28.5* 27.8*  MCV 96.9 96.5  PLT 255 976   Basic Metabolic Panel Recent Labs    09/08/22 0407 09/09/22 0512 09/10/22 0537  NA 135 135 137  K 4.5 4.4 3.8  CL 102 101 101  CO2 '24 22 25  '$ GLUCOSE 104* 106* 107*  BUN 59* 60* 64*  CREATININE 2.85* 2.89* 3.23*  CALCIUM 8.9 8.7* 9.0  MG 2.0 2.1  --    Liver Function Tests No results for input(s): "AST", "ALT", "ALKPHOS", "BILITOT", "PROT", "ALBUMIN" in the last 72 hours.  No results for input(s): "LIPASE", "AMYLASE" in the last 72 hours. Cardiac Enzymes No results for input(s): "CKTOTAL", "CKMB", "CKMBINDEX", "TROPONINI" in the last 72 hours.  BNP: BNP (last 3 results) Recent Labs    08/27/22 1658 08/30/22 1409 09/08/22 0407  BNP >5000.0* >4,500.0* 2,633.7*    Imaging    No results found.   Medications:     Scheduled Medications:  amiodarone  200 mg Oral Daily   apixaban  2.5 mg Oral BID   azaTHIOprine  100 mg Oral Daily   bumetanide  1 mg Oral Daily   Chlorhexidine Gluconate Cloth  6 each Topical Daily   cycloSPORINE  1 drop Both Eyes BID   folic acid  1 mg Oral Daily   hydrocerin   Topical Daily   lidocaine  1 patch  Transdermal Q24H   pantoprazole  80 mg Oral Daily   predniSONE  5 mg Oral Q breakfast   sodium bicarbonate  650 mg Oral Daily   sodium chloride flush  10-40 mL Intracatheter Q12H   sodium chloride flush  3 mL Intravenous Q12H   Vericiguat  10 mg Oral Daily    Infusions:    PRN Medications: acetaminophen **OR** acetaminophen, alum & mag hydroxide-simeth, guaiFENesin-dextromethorphan, ondansetron **OR** ondansetron (ZOFRAN) IV, polyvinyl alcohol, senna-docusate, sodium chloride flush    Patient Profile   [name] is a 77 y.o. AAF with history of rheumatoid arthritis, atrial fibrillation, CKD stage 3, post-herpetic neuralgia (shingles L upper back 3/23) and nonischemic cardiomyopathy. AHF team asked to see for acute on chronic mid-range  (previously systolic) CHF.    Assessment/Plan   1. 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   - Echo 07/2022 EF improved 45-50%. RV normal  - RHC 12/21 low filling pressures and relatively preserved cardiac index at 2.22. - Echo 09/01/22 EF < 20% RV severely reduced - NYHA class IIIb/IV on admission - Continue midodrine 5 mg tid.  - Continue Verquvo 10 mg daily.  - Repeat TTE on 09/01/22 with severe biventricular failure - hip repair ?nail precludes her from CMR.  - Suspect she is end-stage.  - CO-OX stable off milrinone.  - CVP 6/7 today. Continue home Bumex '1mg'$  daily. Dose given this morning. Plan to hold tomorrows dose. - Renal function bumped. 2.89>3.23    2. AKI on CKD IV - She is followed by Dr. Royce Macadamia. - Baseline 2.4-2.6?Marland Kitchen Most recently her baseline has been around 2.8-3.3 - Creatinine peaked 3.24--> today 2.89>3.23   3. Rheumatoid arthritis: No history of lung involvement. On low dose of prednisone chronically.    4. Atrial fibrillation/flutter: S/p TEE-DCCV in 4/22.   - Maintaining SR.  - Continue amio 200 mg daily.  - LFTs and TSH normal (11/23). - Continue Eliquis 2.5 mg twice a day. On lower dose with weight/creatinine   5. Shingles: Resolved.    6. Left Hip Fracture - S/P IM Nail 07/2022 - PT/OT-->HH. Plan to go back home with her daughter.    7. Swollen lymph nodes - primarily L side - white patches seen in back of throat - discussed with primary - strep (-)   8. Microcytic anemia - 2/2 plaquenil? - per primary - Hgb 7 on admission. s/p 1uPRBc 09/02/22 - Got feraheme 09/01/22    Length of Stay: [place], NP  09/10/2022, 9:36 AM  Advanced Heart Failure Team Pager [phone] (M-F; 7a - 5p)  Please contact [facility] for night-coverage after hours (5p -7a ) and weekends on [email or web address]  Patient seen with NP, agree with the above note.   CVP stable at 6-7 with co-ox 64%, but creatinine higher at 3.23 from  2.89.  She already had Bumex today.   General: NAD Neck: Thick. No JVD, no thyromegaly or thyroid nodule.  Lungs: Clear to auscultation bilaterally with normal respiratory effort. CV: Nondisplaced PMI.  Heart regular S1/S2, no S3/S4, no murmur.  No peripheral edema.    Abdomen: Soft, nontender, no hepatosplenomegaly, no distention.  Skin: Intact without lesions or rashes.  Neurologic: Alert and oriented x 3.  Psych: Normal affect. Extremities: No clubbing or cyanosis.  HEENT: Normal.   Stable CVP and co-ox but creatinine higher.  - Encourage po intake today.  - Hold bumetanide for now.  If creatinine is stable tomorrow, can let her  go home and restart bumetanide 1 mg daily the following day.   Overall, poor long-term prognosis with cardiomyopathy and cardiorenal syndrome.  Not candidate for advanced therapies.   Loralie Champagne 09/10/2022 11:42 AM

## 2022-09-10 NOTE — Progress Notes (Signed)
   09/10/22 1609  Mobility  Activity Stood at bedside;Repositioned in chair  Level of Assistance Minimal assist, patient does 75% or more  Assistive Device  (HHA)  LLE Weight Bearing WBAT  Activity Response Tolerated well  Mobility Referral Yes  $Mobility charge 1 Mobility   Mobility Specialist Progress Note  Pt in chair and agreeable. Refused further mobility d/t  "Walking all day". Left in chair w/ all needs met and call bell in reach.   Lucious Groves Mobility Specialist  Please contact via SecureChat or Rehab office at [phone]

## 2022-09-10 NOTE — Progress Notes (Signed)
Occupational Therapy Treatment Patient Details Name: Tracy Nelson MRN: [id number] DOB: 1946/02/13 Today's Date: 09/10/2022   History of present illness 77 y/o female who presents on 1/21 with cough, SOB, weakness. CXR- small bilateral pleural effusions. Admitted with acute on chronic HF, VQ scan negative for PE.  PMH includes HF, nonischemic cardiomyopathy, RA, CKD IV, recent IM nail L hip 07/10/22, HTN.   OT comments  Pt currently min guard assist for transfers and LB selfcare sit to stand.  Pt needed increased persuasion to participate this session and get OOB so her linens could be changed and she could be cleaned secondary Purewick leaking, Feel she will continue to benefit from acute care OT at this time to continue progression toward modified independent level/supervision.  Will continue to follow.    Recommendations for follow up therapy are one component of a multi-disciplinary discharge planning process, led by the attending physician.  Recommendations may be updated based on patient status, additional functional criteria and insurance authorization.    Follow Up Recommendations  Home health OT     Assistance Recommended at Discharge Frequent or constant Supervision/Assistance  Patient can return home with the following  A little help with walking and/or transfers;A little help with bathing/dressing/bathroom;Assistance with cooking/housework;Assist for transportation;Help with stairs or ramp for entrance   Equipment Recommendations  None recommended by OT       Precautions / Restrictions Precautions Precautions: Fall Precaution Comments: monitor HR and sats Restrictions Weight Bearing Restrictions: No LLE Weight Bearing: Weight bearing as tolerated       Mobility Bed Mobility Overal bed mobility: Needs Assistance Bed Mobility: Supine to Sit     Supine to sit: Min guard, HOB elevated     General bed mobility comments: Increased time and HOB elevated up to 20  degrees.    Transfers Overall transfer level: Needs assistance Equipment used: Rolling walker (2 wheels) Transfers: Sit to/from Stand Sit to Stand: Min guard           General transfer comment: Min instructional cueing for hand placement to push up from the bed with one hand instead of trying to pull up on the RW.     Balance Overall balance assessment: Needs assistance Sitting-balance support: Feet supported Sitting balance-Leahy Scale: Good     Standing balance support: Bilateral upper extremity supported, During functional activity Standing balance-Leahy Scale: Fair Standing balance comment: Pt can stand without UE support but needs use of the RW for mobility                           ADL either performed or assessed with clinical judgement   ADL Overall ADL's : Needs assistance/impaired             Lower Body Bathing: Min guard;Sit to/from stand Lower Body Bathing Details (indicate cue type and reason): EOB Upper Body Dressing : Minimal assistance;Sitting Upper Body Dressing Details (indicate cue type and reason): for hospital gown Lower Body Dressing: Min guard;Sit to/from stand Lower Body Dressing Details (indicate cue type and reason): to doff and donn new mesh brief Toilet Transfer: Min guard;Ambulation;Rolling walker (2 wheels) Toilet Transfer Details (indicate cue type and reason): simulated as pt declined needing to go to the bathroom Toileting- Clothing Manipulation and Hygiene: Min guard;Sit to/from stand       Functional mobility during ADLs: Min guard;Rolling walker (2 wheels) General ADL Comments: Pt's HR remained around 90-92 BPM with bathing EOB and transfers.  Slight increased left hip pain noted with movement but pt not limited with transition to the EOB or to the bedside recliner.      Cognition Arousal/Alertness: Awake/alert Behavior During Therapy: WFL for tasks assessed/performed Overall Cognitive Status: No family/caregiver  present to determine baseline cognitive functioning                                 General Comments: Pt with decreased understanding of why she should get up OOB in order to wash up and change her linens from Purewick leaking.                   Pertinent Vitals/ Pain       Pain Assessment Pain Assessment: Faces Faces Pain Scale: Hurts a little bit Pain Location: L hip Pain Descriptors / Indicators: Discomfort Pain Intervention(s): Limited activity within patient's tolerance, Monitored during session, Repositioned         Frequency  Min 2X/week        Progress Toward Goals  OT Goals(current goals can now be found in the care plan section)  Progress towards OT goals: Progressing toward goals  Acute Rehab OT Goals Patient Stated Goal: Pt reports not feeling well but no specific as to why. OT Goal Formulation: With patient Time For Goal Achievement: 09/22/22 Potential to Achieve Goals: Good  Plan Discharge plan remains appropriate       AM-PAC OT "6 Clicks" Daily Activity     Outcome Measure   Help from another person eating meals?: None Help from another person taking care of personal grooming?: A Little Help from another person toileting, which includes using toliet, bedpan, or urinal?: A Little Help from another person bathing (including washing, rinsing, drying)?: A Little Help from another person to put on and taking off regular upper body clothing?: A Little Help from another person to put on and taking off regular lower body clothing?: A Little 6 Click Score: 19    End of Session Equipment Utilized During Treatment: Rolling walker (2 wheels)  OT Visit Diagnosis: Other abnormalities of gait and mobility (R26.89);Muscle weakness (generalized) (M62.81);History of falling (Z91.81)   Activity Tolerance Patient tolerated treatment well   Patient Left in chair;with call bell/phone within reach;Other (comment)   Nurse Communication Mobility  status        Time: 7591-6384 OT Time Calculation (min): 46 min  Charges: OT General Charges $OT Visit: 1 Visit OT Treatments $Self Care/Home Management : 38-52 mins  Kymberly Blomberg OTR/L 09/10/2022, 2:17 PM

## 2022-09-11 DIAGNOSIS — I5023 Acute on chronic systolic (congestive) heart failure: Secondary | ICD-10-CM | POA: Diagnosis not present

## 2022-09-11 LAB — COOXEMETRY PANEL
Carboxyhemoglobin: 1.6 % — ABNORMAL HIGH (ref 0.5–1.5)
Methemoglobin: <0.7 % (ref 0.0–1.5)
O2 Saturation: 65.5 %
Total hemoglobin: 8.5 g/dL — ABNORMAL LOW (ref 12.0–16.0)

## 2022-09-11 LAB — CBC
HCT: 27 % — ABNORMAL LOW (ref 36.0–46.0)
Hemoglobin: 8.3 g/dL — ABNORMAL LOW (ref 12.0–15.0)
MCH: 29.7 pg (ref 26.0–34.0)
MCHC: 30.7 g/dL (ref 30.0–36.0)
MCV: 96.8 fL (ref 80.0–100.0)
Platelets: 249 10*3/uL (ref 150–400)
RBC: 2.79 MIL/uL — ABNORMAL LOW (ref 3.87–5.11)
RDW: 23.1 % — ABNORMAL HIGH (ref 11.5–15.5)
WBC: 5.6 10*3/uL (ref 4.0–10.5)
nRBC: 0 % (ref 0.0–0.2)

## 2022-09-11 LAB — BASIC METABOLIC PANEL
Anion gap: 8 (ref 5–15)
BUN: 64 mg/dL — ABNORMAL HIGH (ref 8–23)
CO2: 24 mmol/L (ref 22–32)
Calcium: 8.8 mg/dL — ABNORMAL LOW (ref 8.9–10.3)
Chloride: 106 mmol/L (ref 98–111)
Creatinine, Ser: 2.76 mg/dL — ABNORMAL HIGH (ref 0.44–1.00)
GFR, Estimated: 17 mL/min — ABNORMAL LOW (ref >60)
Glucose, Bld: 85 mg/dL (ref 70–99)
Potassium: 4.1 mmol/L (ref 3.5–5.1)
Sodium: 138 mmol/L (ref 135–145)

## 2022-09-11 MED ORDER — BUMETANIDE 1 MG PO TABS: 1.0000 mg | ORAL_TABLET | Freq: Every day | ORAL | Status: DC

## 2022-09-11 NOTE — Progress Notes (Signed)
Physical Therapy Treatment Patient Details Name: Tracy Nelson MRN: [id number] DOB: 1946/01/15 Today's Date: 09/11/2022   History of Present Illness 77 y/o female who presents on 1/21 with cough, SOB, weakness. CXR- small bilateral pleural effusions. Admitted with acute on chronic HF, VQ scan negative for PE.  PMH includes HF, nonischemic cardiomyopathy, RA, CKD IV, recent IM nail L hip 07/10/22, HTN.    PT Comments    Pt received in bed. Declined OOB but agreeable to exercises. Pt performed BLE exercises supine. Assisted with scooting up in bed at end of session. HOB at 35 degrees.     Recommendations for follow up therapy are one component of a multi-disciplinary discharge planning process, led by the attending physician.  Recommendations may be updated based on patient status, additional functional criteria and insurance authorization.  Follow Up Recommendations  Home health PT     Assistance Recommended at Discharge Frequent or constant Supervision/Assistance  Patient can return home with the following A little help with walking and/or transfers;A little help with bathing/dressing/bathroom;Assistance with cooking/housework;Assist for transportation;Help with stairs or ramp for entrance   Equipment Recommendations  None recommended by PT    Recommendations for Other Services       Precautions / Restrictions Precautions Precautions: Fall;Other (comment) Precaution Comments: monitor HR and sats Restrictions LLE Weight Bearing: Weight bearing as tolerated     Mobility  Bed Mobility                    Transfers                        Ambulation/Gait                   Stairs             Wheelchair Mobility    Modified Rankin (Stroke Patients Only)       Balance                                            Cognition Arousal/Alertness: Awake/alert Behavior During Therapy: WFL for tasks assessed/performed Overall  Cognitive Status: No family/caregiver present to determine baseline cognitive functioning                                 General Comments: Appropriate. Follows commands. Poor insight.        Exercises General Exercises - Lower Extremity Ankle Circles/Pumps: AROM, Both, 10 reps, Supine Quad Sets: AROM, Both, 10 reps, Supine Gluteal Sets: AROM, Both, 10 reps, Supine Heel Slides: AROM, Right, Left, 10 reps, Supine Hip ABduction/ADduction: AROM, Right, Left, 10 reps, Supine Straight Leg Raises: AROM, Right, Left, 10 reps, Supine    General Comments        Pertinent Vitals/Pain Pain Assessment Pain Assessment: Faces Faces Pain Scale: Hurts a little bit Pain Location: LLE Pain Descriptors / Indicators: Grimacing Pain Intervention(s): Limited activity within patient's tolerance, Monitored during session, Repositioned    Home Living                          Prior Function            PT Goals (current goals can now be found in the care plan section) Acute Rehab PT Goals  Patient Stated Goal: home Progress towards PT goals: Progressing toward goals    Frequency    Min 3X/week      PT Plan Current plan remains appropriate    Co-evaluation              AM-PAC PT "6 Clicks" Mobility   Outcome Measure  Help needed turning from your back to your side while in a flat bed without using bedrails?: None Help needed moving from lying on your back to sitting on the side of a flat bed without using bedrails?: None Help needed moving to and from a bed to a chair (including a wheelchair)?: None Help needed standing up from a chair using your arms (e.g., wheelchair or bedside chair)?: A Little Help needed to walk in hospital room?: A Little Help needed climbing 3-5 steps with a railing? : A Little 6 Click Score: 21    End of Session   Activity Tolerance: Patient limited by fatigue Patient left: in bed;with call bell/phone within reach Nurse  Communication: Mobility status PT Visit Diagnosis: Muscle weakness (generalized) (M62.81);Difficulty in walking, not elsewhere classified (R26.2)     Time: 9458-5929 PT Time Calculation (min) (ACUTE ONLY): 12 min  Charges:  $Therapeutic Exercise: 8-22 mins                     Gloriann Loan., PT  Office # [phone]    Lorriane Shire 09/11/2022, 11:26 AM

## 2022-09-11 NOTE — Care Management Important Message (Signed)
Important Message  Patient Details  Name: Tracy Nelson MRN: [id number] Date of Birth: [birth year]   Medicare Important Message Given:  Yes     Shelda Altes 09/11/2022, 10:33 AM

## 2022-09-11 NOTE — Plan of Care (Signed)
  Problem: Education: Goal: Ability to demonstrate management of disease process will improve Outcome: Progressing Goal: Ability to verbalize understanding of medication therapies will improve Outcome: Progressing Goal: Individualized Educational Video(s) Outcome: Progressing   Problem: Activity: Goal: Capacity to carry out activities will improve Outcome: Progressing   Problem: Cardiac: Goal: Ability to achieve and maintain adequate cardiopulmonary perfusion will improve Outcome: Progressing   Problem: Education: Goal: Knowledge of General Education information will improve Description: Including pain rating scale, medication(s)/side effects and non-pharmacologic comfort measures Outcome: Progressing   Problem: Health Behavior/Discharge Planning: Goal: Ability to manage health-related needs will improve Outcome: Progressing   Problem: Clinical Measurements: Goal: Ability to maintain clinical measurements within normal limits will improve Outcome: Progressing Goal: Will remain free from infection Outcome: Progressing Goal: Diagnostic test results will improve Outcome: Progressing Goal: Respiratory complications will improve Outcome: Progressing Goal: Cardiovascular complication will be avoided Outcome: Progressing   Problem: Activity: Goal: Risk for activity intolerance will decrease Outcome: Progressing   Problem: Nutrition: Goal: Adequate nutrition will be maintained Outcome: Progressing   Problem: Coping: Goal: Level of anxiety will decrease Outcome: Progressing   Problem: Elimination: Goal: Will not experience complications related to bowel motility Outcome: Progressing Goal: Will not experience complications related to urinary retention Outcome: Progressing   Problem: Pain Managment: Goal: General experience of comfort will improve Outcome: Progressing   Problem: Safety: Goal: Ability to remain free from injury will improve Outcome: Progressing    Problem: Skin Integrity: Goal: Risk for impaired skin integrity will decrease Outcome: Progressing   Problem: Education: Goal: Understanding of CV disease, CV risk reduction, and recovery process will improve Outcome: Progressing Goal: Individualized Educational Video(s) Outcome: Progressing   Problem: Activity: Goal: Ability to return to baseline activity level will improve Outcome: Progressing   Problem: Cardiovascular: Goal: Ability to achieve and maintain adequate cardiovascular perfusion will improve Outcome: Progressing Goal: Vascular access site(s) Level 0-1 will be maintained Outcome: Progressing   Problem: Health Behavior/Discharge Planning: Goal: Ability to safely manage health-related needs after discharge will improve Outcome: Progressing

## 2022-09-11 NOTE — Discharge Summary (Signed)
Physician Discharge Summary  ANYSA TACEY IOX:[id number] DOB: 08-12-1945 DOA: 08/30/2022  PCP: [name], [name]  Admit date: 08/30/2022 Discharge date: 09/11/2022  Time spent: 45 minutes  Recommendations for Outpatient Follow-up:  Advanced heart failure clinic 2/22 Palliative care follow-up Nephrology Dr. Harrie Jeans in 1 month stable   Discharge Diagnoses:  Principal Problem: Acute on chronic systolic CHF Cardiogenic shock Low output heart failure Macrocytic anemia   CKD (chronic kidney disease), stage IV (HCC)   Paroxysmal atrial fibrillation (HCC)   Anemia of chronic disease   Rheumatoid arthritis involving multiple sites with positive rheumatoid factor ([place])   Pressure ulcer   Discharge Condition: Stable  Diet recommendation: Low-sodium, heart healthy  Filed Weights   09/08/22 0405 09/09/22 0507 09/10/22 0345  Weight: 50.1 kg 51.1 kg 48.9 kg    History of present illness:  77/F, chronically ill with chronic systolic CHF, NICM, EF 26-83% recently improved to 45%, CKD 4, paroxysmal A-fib/A-flutter, anemia of chronic disease, rheumatoid arthritis on Plaquenil and azathioprine, left hip fracture treated with IM nail 07/10/2022, postop went to rehab and was discharged home 5 days ago. -Presented to the ED with progressive dyspnea on exertion, orthopnea and cough, Compliant with Bumex daily, took an extra dose day prior to admission -In the ED vital stable, hemoglobin 7.3, creatinine 2.9, bicarb 16, BNP> 4500, D-dimer 1.95, troponin 51 -Chest x-ray with small bilateral pleural effusions -Admitted, started on IV Lasix, IV heparin, VQ scan negative  Hospital Course:   Acute on chronic HFrEF -Known NICM, previously EF 20-25%, last echo 12/23 with improvement to 41-96%, grade 1 diastolic dysfunction, normal RV -Followed by Dr. Aundra Dubin with advanced heart failure team, complicated by CKD 4 and hypotension limiting GDMT -ECHO 1/23 w/ EF <20% with severe RV  dysfunction -started on milrinone 1/23, then diuresed with IV Lasix -now off milrinone, poor candidate for advanced therapies -Followed by CHF team, Palliative consulted, grave prognosis, quick readmission, patient wishes to remain full code with full scope of treatment,  -Now medically optimized from a heart failure standpoint, discharged home to resume oral Bumex 1 Mg daily from tomorrow, heart failure team follow-up arranged for 2/22, palliative care follow-up recommended as well   Macrocytic anemia -Hemoglobin has trended down from 9-10 range over the last 3 months to 7 range now, she denies any overt bleeding or blood loss, on Eliquis at baseline -Plaquenil can also cause bone marrow suppression, per rheum notes 2/23-Plaquenil discontinued at that follow-up, unfortunately patient continued taking this and did not know it was supposed to be stopped -Saw hematology 8/22, noted to have M spike then, 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 Royce Macadamia.    Elevated d-dimer -nonspecific in the setting of long history of RA, VQ scan negative, heparin discontinued, Eliquis resumed   Paroxysmal atrial fibrillation (HCC) -Sinus rhythm at this time, continue amiodarone and Eliquis   Rheumatoid arthritis involving multiple sites with positive rheumatoid factor (HCC) On chronic immunosuppressants.  Continue prednisone, Imuran. -No indication for stress dose steroids -Reviewed rheumatology notes, Dr. Benjamine Mola 2/23 and discontinued Plaquenil, patient unfortunately patient continue to take this for 11 months after  DC  Procedures:  Consultations: Heart failure team  Discharge Exam: Vitals:   09/10/22 1947 09/11/22 0823  BP: 96/69 90/60  Pulse: 86 84  Resp: 20 17  Temp: 98.6 F (37 C) 98.3 F (36.8 C)  SpO2: 99% 98%   Chronically ill female sitting up in bed, AAOx3, cushingoid facies HEENT: No JVD CVS: S1-S2, regular rhythm Lungs: Decreased breath sounds at the bases Abdomen: Soft, nontender, bowel sounds present Extremities: No edema  Discharge Instructions    Allergies as of 09/11/2022       Reactions   Baclofen Nausea And Vomiting   Penicillin G    Other reaction(s): Unknown   Penicillins Rash        Medication List     STOP taking these medications    hydroxychloroquine 200 MG tablet Commonly known as: PLAQUENIL   midodrine 5 MG tablet Commonly known as: PROAMATINE   sodium bicarbonate 650 MG tablet       TAKE these medications    amiodarone 200 MG tablet Commonly known as: PACERONE TAKE 1 TABLET BY MOUTH EVERY DAY   azaTHIOprine 50 MG tablet Commonly known as: IMURAN TAKE 2 TABLETS BY MOUTH EVERY DAY   bumetanide 1 MG tablet Commonly known as: BUMEX Take 1 tablet (1 mg total) by mouth daily. Start taking on: September 12, 2022   calcitRIOL 0.25 MCG capsule Commonly known as: ROCALTROL Take 0.25 mcg by mouth daily.   Eliquis 2.5 MG Tabs tablet Generic drug: apixaban TAKE 1 TABLET BY MOUTH TWICE A DAY What changed: how much to take   Ensure Take 237 mLs by mouth daily as needed (for meal supplement).   folic acid 1 MG tablet Commonly known as: FOLVITE TAKE 1 TABLET BY MOUTH EVERY DAY   hydroxypropyl methylcellulose / hypromellose 2.5 % ophthalmic solution Commonly known as: ISOPTO TEARS / GONIOVISC Place 1 drop into both eyes as needed for dry eyes.   ICY HOT BACK EX Apply 1 application topically daily as needed (pain).   lidocaine 5 % Commonly known as: Lidoderm Place 1 patch onto the skin daily. Remove & Discard patch within 12  hours or as directed by MD What changed:  when to take this reasons to take this   multivitamin tablet Take 1 tablet by mouth daily.   omeprazole 40 MG capsule Commonly known as: PRILOSEC Take 40 mg by mouth daily as needed (for acid reflux).   polyethylene glycol 17 g packet Commonly known as: MIRALAX / GLYCOLAX Take 17 g by mouth daily as needed for mild constipation.   predniSONE 5 MG tablet Commonly known as: DELTASONE TAKE 1 TABLET BY MOUTH EVERY DAY WITH BREAKFAST What changed: See the new instructions.   Verquvo 10 MG Tabs Generic drug: Vericiguat Take 10 mg by mouth daily.       Allergies  Allergen Reactions   Baclofen Nausea And Vomiting   Penicillin G     Other reaction(s): Unknown   Penicillins Rash    Follow-up Information     [name], [name] Follow up.   Specialty: General Practice Contact information: [address] [place] [address] [place], [place] Follow up.   Specialty: Home Health Services Why: Home Health RN Contact information: [place] [address] [facility] [place] Follow up on 10/01/2022.   Specialty: Cardiology Why: Follow-up in the Advanced  Heart Failure Clinic 10/01/22 at 1030 am Entrance C, Free Valet Please bring all medications with you Contact information: [address] [id number] [place] [phone]                 The results of significant diagnostics from this hospitalization (including imaging, microbiology, ancillary and laboratory) are listed below for reference.    Significant Diagnostic Studies: DG Chest Port 1 View  Result Date: 09/07/2022 CLINICAL DATA:  Shortness of breath EXAM: PORTABLE CHEST 1 VIEW COMPARISON:  08/30/2022 FINDINGS: Cardiac shadow is enlarged but stable. Right PICC is noted at the cavoatrial junction. The lungs  are well aerated. Minimal atelectatic changes in the left base are seen. No other focal abnormality is noted. IMPRESSION: Mild left basilar atelectasis. Electronically Signed   By: Inez Catalina M.D.   On: 09/07/2022 20:26   CARDIAC CATHETERIZATION  Result Date: 09/03/2022 1. Good cardiac output on milrinone. 2. Mildly elevated right and left heart filling pressures with mild pulmonary venous hypertension. Continue milrinone for now to see if we can get her renal function back to her baseline.  Will add back oral diuretic tomorrow.  Long-term, poor candidate for advanced therapies given frailty/deconditioning and renal dysfunction.  Would consider her for palliative home milrinone if unable to successfully wean off this admission.   [place] EKG SITE RITE  Result Date: 09/01/2022 If [facility] image not attached, placement could not be confirmed due to current cardiac rhythm.  ECHOCARDIOGRAM LIMITED  Result Date: 09/01/2022    ECHOCARDIOGRAM LIMITED REPORT   Patient Name:   EMBRIE MIKKELSEN Date of Exam: 09/01/2022 Medical Rec #:  [id number]         Height:       63.0 in Accession #:    [id number]        Weight:       108.2 lb Date of Birth:  04/19/1946          BSA:          1.490 m Patient Age:    52 years          BP:           116/102 mmHg Patient Gender: F                 HR:           93 bpm. Exam Location:  Inpatient Procedure: Limited Echo, Cardiac Doppler, Color Doppler and 3D Echo Indications:    Nonischemic Cardiomyopathy I42.8  History:        Patient has prior history of Echocardiogram examinations. CHF,                 Arrythmias:Atrial Flutter; Risk Factors:Hypertension.  Sonographer:    Darlina Sicilian RDCS Referring Phys: [id number] ADITYA SABHARWAL  Sonographer Comments: Definity not used, patient wanted exam to be complete. IMPRESSIONS  1. Left ventricular ejection fraction, by estimation, is <20%. The left ventricle has severely decreased function. The left ventricle demonstrates global  hypokinesis.  2. Right ventricular systolic function is severely reduced. The right ventricular size is normal. There is moderately elevated pulmonary artery systolic pressure.  3. The mitral valve is degenerative. Moderate mitral valve regurgitation. No evidence of mitral stenosis.  4. The tricuspid valve is abnormal. Tricuspid valve regurgitation is moderate to severe.  5. The aortic valve is tricuspid. There is mild calcification of the aortic valve. Aortic valve regurgitation is mild to moderate. Aortic valve sclerosis is present, with  no evidence of aortic valve stenosis.  6. Pulmonic valve regurgitation is moderate.  7. The inferior vena cava is dilated in size with <50% respiratory variability, suggesting right atrial pressure of 15 mmHg. FINDINGS  Left Ventricle: Left ventricular ejection fraction, by estimation, is <20%. The left ventricle has severely decreased function. The left ventricle demonstrates global hypokinesis. The left ventricular internal cavity size was normal in size. Right Ventricle: The right ventricular size is normal. Right ventricular systolic function is severely reduced. There is moderately elevated pulmonary artery systolic pressure. The tricuspid regurgitant velocity is 2.83 m/s, and with an assumed right atrial pressure of 15 mmHg, the estimated right ventricular systolic pressure is 74.2 mmHg. Pericardium: There is no evidence of pericardial effusion. Mitral Valve: The mitral valve is degenerative in appearance. There is mild thickening of the mitral valve leaflet(s). There is mild calcification of the mitral valve leaflet(s). Mild mitral annular calcification. Moderate mitral valve regurgitation. No evidence of mitral valve stenosis. MV peak gradient, 0.9 mmHg. The mean mitral valve gradient is 0.0 mmHg. Tricuspid Valve: The tricuspid valve is abnormal. Tricuspid valve regurgitation is moderate to severe. No evidence of tricuspid stenosis. Aortic Valve: The aortic valve is  tricuspid. There is mild calcification of the aortic valve. Aortic valve regurgitation is mild to moderate. Aortic regurgitation PHT measures 344 msec. Aortic valve sclerosis is present, with no evidence of aortic valve stenosis. Pulmonic Valve: The pulmonic valve was normal in structure. Pulmonic valve regurgitation is moderate. Aorta: The aortic root is normal in size and structure. Venous: The inferior vena cava is dilated in size with less than 50% respiratory variability, suggesting right atrial pressure of 15 mmHg. Additional Comments: Spectral Doppler performed. Color Doppler performed.  LEFT VENTRICLE PLAX 2D LVIDd:         5.00 cm LVIDs:         4.20 cm LV PW:         0.70 cm LV IVS:        0.70 cm LVOT diam:     1.80 cm   3D Volume EF: LV SV:         21        3D EF:        19 % LV SV Index:   14        LV EDV:       181 ml LVOT Area:     2.54 cm  LV ESV:       147 ml                          LV SV:        34 ml AORTIC VALVE             PULMONIC VALVE LVOT Vmax:   76.30 cm/s  PR End Diast Vel: 4.75 msec LVOT Vmean:  44.300 cm/s LVOT VTI:    0.082 m AI PHT:      344 msec  AORTA Ao Root diam: 2.80 cm MITRAL VALVE                  TRICUSPID VALVE MV Area VTI:  3.41 cm        TR Peak grad:   32.0 mmHg MV Peak grad: 0.9 mmHg        TR Mean grad:   19.0 mmHg MV Mean grad: 0.0 mmHg        TR Vmax:  283.00 cm/s MV Vmax:      0.47 m/s        TR Vmean:       205.0 cm/s MV Vmean:     21.7 cm/s       TR ERO:         .32 cm2 MR Peak grad:    88.7 mmHg    RE Volume:      25 mL MR Mean grad:    58.0 mmHg MR Vmax:         471.00 cm/s MR Vmean:        358.0 cm/s MR PISA:         0.77 cm MR PISA Eff ROA: 5 mm MR PISA Radius:  0.35 cm      SHUNTS                               Systemic VTI:  0.08 m                               Systemic Diam: 1.80 cm Dorris Carnes MD Electronically signed by Dorris Carnes MD Signature Date/Time: 09/01/2022/2:21:36 PM    Final    NM Pulmonary Perfusion  Result Date: 08/31/2022 CLINICAL  DATA:  Pulmonary embolism suspected. Low to intermediate clinical probability. Positive D-dimer. Cough. EXAM: NUCLEAR MEDICINE PERFUSION LUNG SCAN TECHNIQUE: Perfusion images were obtained in multiple projections after intravenous injection of radiopharmaceutical. Ventilation scans intentionally deferred if perfusion scan and chest x-ray adequate for interpretation during COVID 19 epidemic. RADIOPHARMACEUTICALS:  4.0 mCi Tc-64mMAA IV COMPARISON:  Chest radiography 08/30/2022 FINDINGS: No perfusion defect to suggest pulmonary embolism. Enlarged heart shadow. Minimal pleural blunting consistent with the small pleural effusions. IMPRESSION: No perfusion defect to suggest pulmonary emboli. Cardiomegaly. Small pleural effusions. Electronically Signed   By: MNelson ChimesM.D.   On: 08/31/2022 10:36   DG Chest 2 View  Result Date: 08/30/2022 CLINICAL DATA:  Shortness of breath EXAM: CHEST - 2 VIEW COMPARISON:  Chest x-ray 07/08/2022 FINDINGS: The heart is enlarged, unchanged. There are small bilateral pleural effusions. There are minimal bibasilar patchy opacities, left greater than right. There is no evidence for pneumothorax. No acute fractures are seen. IMPRESSION: 1. Small bilateral pleural effusions. 2. Minimal bibasilar patchy opacities, left greater than right, may represent atelectasis or infection. Electronically Signed   By: ARonney AstersM.D.   On: 08/30/2022 15:19    Microbiology: No results found for this or any previous visit (from the past 240 hour(s)).   Labs: Basic Metabolic Panel: Recent Labs  Lab 09/05/22 0226 09/06/22 0516 09/07/22 0607 09/08/22 0407 09/09/22 0512 09/10/22 0537 09/11/22 0522  NA 134* 136 136 135 135 137 138  K 3.8 3.3* 3.5 4.5 4.4 3.8 4.1  CL 100 103 102 102 101 101 106  CO2 '24 23 24 24 22 25 24  '$ GLUCOSE 126* 126* 105* 104* 106* 107* 85  BUN 64* 64* 61* 59* 60* 64* 64*  CREATININE 3.37* 3.13* 3.24* 2.85* 2.89* 3.23* 2.76*  CALCIUM 8.8* 8.4* 8.8* 8.9 8.7* 9.0  8.8*  MG 2.0 1.9 2.1 2.0 2.1  --   --    Liver Function Tests: No results for input(s): "AST", "ALT", "ALKPHOS", "BILITOT", "PROT", "ALBUMIN" in the last 168 hours. No results for input(s): "LIPASE", "AMYLASE" in the last 168 hours. No results for input(s): "AMMONIA" in the last  168 hours. CBC: Recent Labs  Lab 09/09/22 0512 09/10/22 0537 09/11/22 0522  WBC 6.6 6.8 5.6  HGB 8.7* 8.7* 8.3*  HCT 28.5* 27.8* 27.0*  MCV 96.9 96.5 96.8  PLT 255 253 249   Cardiac Enzymes: No results for input(s): "CKTOTAL", "CKMB", "CKMBINDEX", "TROPONINI" in the last 168 hours. BNP: BNP (last 3 results) Recent Labs    08/27/22 1658 08/30/22 1409 09/08/22 0407  BNP >5000.0* >4,500.0* 2,633.7*    ProBNP (last 3 results) No results for input(s): "PROBNP" in the last 8760 hours.  CBG: No results for input(s): "GLUCAP" in the last 168 hours.     Signed:  Domenic Polite MD.  [facility] 09/11/2022, 2:00 PM

## 2022-09-11 NOTE — Progress Notes (Signed)
2221: Family members arrive on unit for patient pickup. Home medications retrieved from Inpatient pharmacy, LDAs discharged on day shift, and peri care given to patient. All discharged instructions given, with understanding verbalized. VSS, pain/discomfort denied and no acute distress noted.  2223: Patient transferred to wheel chair and escorted off unit to private vehicle.

## 2022-09-11 NOTE — TOC CM/SW Note (Signed)
Patient has CHF and requires frequent visits to the bathroom. Having a bedside commode at bedside can conserve her energy and help reduce falls due to her urgency to use bathroom. S[place], Heart Failure TOC CM [phone]

## 2022-09-11 NOTE — Progress Notes (Signed)
Mobility Specialist Progress Note    09/11/22 1411  Mobility  Activity Ambulated with assistance in hallway  Level of Assistance Contact guard assist, steadying assist  Assistive Device Four wheel walker  Distance Ambulated (ft) 110 ft  Activity Response Tolerated well  Mobility Referral Yes  $Mobility charge 1 Mobility   Pre-Mobility: 87 HR Post-Mobility: 99 HR  Pt received in bed and agreeable. C/o blister pain. Returned to chair with NT present.    Hildred Alamin Mobility Specialist  Please Psychologist, sport and exercise or Rehab Office at [phone]

## 2022-09-11 NOTE — TOC Initial Note (Addendum)
Transition of Care [facility]) - Initial/Assessment Note    Patient Details  Name: Tracy Nelson MRN: [id number] Date of Birth: Oct 20, 1945  [facility] CM/SW Contact:    Erenest Rasher, RN Phone Number: [phone] 09/11/2022, 3:39 PM  Clinical Narrative:        Spoke to dtr, states she has bedside commode, RW and Rollator at home. Cancelled bedside commode order with [employer]. States she will traveling out of town and will be back at 10 pm to pick pt up. States she does not want taxi or [place]. Pt will be billed for PTAR transport. And dtr does not feel pt is safe to travel in taxi. Updated Unit RN and attending.              CM spoke pt and requested 3n1 bedside commode. Referral for outpt palliative service, contacted [facility] with new referral. Contacted [facility] rep, for scheduled dc home today. [place] for bedside commode for home to be delivered to room prior to dc.   Expected Discharge Plan: [place] Barriers to Discharge: No Barriers Identified   Patient Goals and CMS Choice Patient states their goals for this hospitalization and ongoing recovery are:: return home CMS Medicare.gov Compare Post Acute Care list provided to:: Patient Choice offered to / list presented to : NA      Expected Discharge Plan and Services In-house Referral: NA Discharge Planning Services: CM Consult Post Acute Care Choice: NA Living arrangements for the past 2 months: Single Family Home Expected Discharge Date: 09/11/22               DME Arranged: 3-N-1 DME Agency: [employer] Date DME Agency Contacted: 09/11/22 Time DME Agency Contacted: [id number] Representative spoke with at DME Agency: [place]: RN, PT, Nurse's Aide [place] Agency: [place], [employer] Date [place]: 09/11/22 Time [place]: 1538 Representative spoke with at [place]: Otila Kluver  Prior Living  Arrangements/Services Living arrangements for the past 2 months: [place] Lives with:: Adult Children Patient language and need for interpreter reviewed:: Yes Do you feel safe going back to the place where you live?: Yes      Need for Family Participation in Patient Care: No (Comment) Care giver support system in place?: Yes (comment) Current home services: DME, Home PT, Home RN, Home OT Criminal Activity/Legal Involvement Pertinent to Current Situation/Hospitalization: No - Comment as needed  Activities of Daily Living Home Assistive Devices/Equipment: Environmental consultant (specify type) ADL Screening (condition at time of admission) Patient's cognitive ability adequate to safely complete daily activities?: No Is the patient deaf or have difficulty hearing?: No Does the patient have difficulty seeing, even when wearing glasses/contacts?: No Does the patient have difficulty concentrating, remembering, or making decisions?: No Patient able to express need for assistance with ADLs?: Yes Does the patient have difficulty dressing or bathing?: No Independently performs ADLs?: Yes (appropriate for developmental age) Does the patient have difficulty walking or climbing stairs?: No Weakness of Legs: Left Weakness of Arms/Hands: None  Permission Sought/Granted Permission sought to share information with : Case Manager, Family Supports, PCP Permission granted to share information with : Yes, Verbal Permission Granted  Share Information with NAME: [name] Mansanto     Permission granted to share info w Relationship: daughter  Permission granted to share info w Contact Information: [phone]  Emotional Assessment Appearance:: Appears stated age Attitude/Demeanor/Rapport: Engaged Affect (typically observed): Appropriate Orientation: :  Oriented to Self, Oriented to Place, Oriented to  Time, Oriented to Situation Alcohol / Substance Use: Not Applicable Psych Involvement: No  (comment)  Admission diagnosis:  Positive D dimer [R79.89] Acute on chronic HFrEF (heart failure with reduced ejection fraction) (HCC) [I50.23] Congestive heart failure, unspecified HF chronicity, unspecified heart failure type [facility]) [I50.9] Patient Active Problem List   Diagnosis Date Noted   Congestive heart failure ([place]) 09/03/2022   Pressure ulcer 09/03/2022   Acute on chronic HFrEF (heart failure with reduced ejection fraction) ([place]) 08/30/2022   Elevated d-dimer 08/30/2022   Anemia of chronic disease 08/30/2022   Paroxysmal atrial fibrillation ([place]) 63/87/5643   Chronic systolic CHF (congestive heart failure) ([place]) 07/13/2022   CKD (chronic kidney disease), stage IV ([place]) 07/09/2022   Displaced intertrochanteric fracture of left femur, initial encounter for closed fracture ([place]) 07/08/2022   Corneal melt, 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   Monoclonal gammopathy 03/10/2021   Hypotension 10/22/2020   Atypical chest pain 10/22/2020   CKD (chronic kidney disease), stage III ([place]) 10/22/2020   Nausea & vomiting 10/22/2020   Physical deconditioning 10/22/2020   Atrial flutter ([place]) 10/18/2020   Arthritis 07/15/2020   GERD (gastroesophageal reflux disease) 07/15/2020   Colitis 07/15/2020   Non-ischemic cardiomyopathy ([place]) 07/15/2020   HFrEF (heart failure with reduced ejection fraction) ([place]) 07/15/2020   HTN (hypertension) 07/15/2020   Osteoporosis 07/15/2020   Palpitations 07/15/2020   Acute gout of left shoulder 07/10/2020   Rheumatoid arthritis involving multiple sites with positive rheumatoid factor ([place]) 10/28/2015   Mitral regurgitation 05/30/2014   Acute on chronic combined systolic (congestive) and diastolic (congestive) heart failure ([place]) 05/19/2014   Vitamin D deficiency 05/15/2013   Encounter for long-term (current) use of other medications 12/14/2008   Sjogren's syndrome ([place]) 12/14/2008   PCP:  [name], [place] Pharmacy:   [id number] [address]Phone: [phone]: [phone]    Social Determinants of Health (SDOH) Social History: SDOH Screenings   Food Insecurity: No Food Insecurity (08/31/2022)  Housing: Low Risk  (08/31/2022)  Transportation Needs: No Transportation Needs (08/31/2022)  Recent Concern: Transportation Needs - Unmet Transportation Needs (07/09/2022)  Utilities: Not At Risk (08/31/2022)  Depression (PHQ2-9): Low Risk  (08/27/2022)  Financial Resource Strain: Low Risk  (04/30/2022)  Physical Activity: Inactive (04/30/2022)  Stress: No Stress Concern Present (04/30/2022)  Tobacco Use: Low Risk  (09/03/2022)   SDOH Interventions:     Readmission Risk Interventions    08/31/2022   12:35 PM  Readmission Risk Prevention Plan  Transportation Screening Complete  HRI or Home Care Consult Complete  Palliative Care Screening Not Applicable  Medication Review (RN Care Manager) Complete

## 2022-09-11 NOTE — Plan of Care (Signed)
  Problem: Education: Goal: Ability to demonstrate management of disease process will improve Outcome: Progressing Goal: Ability to verbalize understanding of medication therapies will improve Outcome: Progressing   Problem: Health Behavior/Discharge Planning: Goal: Ability to manage health-related needs will improve Outcome: Progressing

## 2022-09-11 NOTE — Progress Notes (Addendum)
Advanced Heart Failure Rounding Note  PCP-Cardiologist: None   Subjective:   RHC 1/25 on milrinone .25 RA mean 10 PA 48/14 mean 29 PCWP mean 17 Cardiac Output (Fick) 4.22  Cardiac Index (Fick) 2.81 PVR 2.8 WU PAPi 3.4    Transitioned to PO Bumex 1/30, CO-OX 66%  Per RN yesterday patient had multiple incontinence episodes so UOP was not able to be accurately documented despite purewick. CVP 6 this morning.   Feels fine this morning. Denies CP/SOB.  Objective:   Weight Range: 48.9 kg Body mass index is 19.08 kg/m.   Vital Signs:   Temp:  [97.2 F (36.2 C)-98.6 F (37 C)] 98.3 F (36.8 C) (02/02 0823) Pulse Rate:  [84-90] 84 (02/02 0823) Resp:  [17-21] 17 (02/02 0823) BP: (90-96)/(60-69) 90/60 (02/02 0823) SpO2:  [98 %-100 %] 98 % (02/02 0823) Last BM Date : 09/10/22  Weight change: Filed Weights   09/08/22 0405 09/09/22 0507 09/10/22 0345  Weight: 50.1 kg 51.1 kg 48.9 kg    Intake/Output:   Intake/Output Summary (Last 24 hours) at 09/11/2022 0842 Last data filed at 09/10/2022 1949 Gross per 24 hour  Intake 560 ml  Output 600 ml  Net -40 ml     CVP 6 Physical Exam  General:  chronically ill appearing.  No respiratory difficulty HEENT: normal Neck: supple. JVD ~6 cm. Carotids 2+ bilat; no bruits. No lymphadenopathy or thyromegaly appreciated. Cor: PMI nondisplaced. Regular rate & rhythm. No rubs, gallops or murmurs. Lungs: clear Abdomen: soft, nontender, nondistended. No hepatosplenomegaly. No bruits or masses. Good bowel sounds. Extremities: no cyanosis, clubbing, rash, edema. PICC RUE Neuro: alert & oriented x 3, cranial nerves grossly intact. moves all 4 extremities w/o difficulty. Affect pleasant.   Telemetry  NSR 80s (Personally reviewed)   EKG    No new EKG to review  Labs    CBC Recent Labs    09/10/22 0537 09/11/22 0522  WBC 6.8 5.6  HGB 8.7* 8.3*  HCT 27.8* 27.0*  MCV 96.5 96.8  PLT 253 606   Basic Metabolic Panel Recent Labs     09/09/22 0512 09/10/22 0537 09/11/22 0522  NA 135 137 138  K 4.4 3.8 4.1  CL 101 101 106  CO2 '22 25 24  '$ GLUCOSE 106* 107* 85  BUN 60* 64* 64*  CREATININE 2.89* 3.23* 2.76*  CALCIUM 8.7* 9.0 8.8*  MG 2.1  --   --    Liver Function Tests No results for input(s): "AST", "ALT", "ALKPHOS", "BILITOT", "PROT", "ALBUMIN" in the last 72 hours.  No results for input(s): "LIPASE", "AMYLASE" in the last 72 hours. Cardiac Enzymes No results for input(s): "CKTOTAL", "CKMB", "CKMBINDEX", "TROPONINI" in the last 72 hours.  BNP: BNP (last 3 results) Recent Labs    08/27/22 1658 08/30/22 1409 09/08/22 0407  BNP >5000.0* >4,500.0* 2,633.7*    Imaging    No results found.   Medications:     Scheduled Medications:  amiodarone  200 mg Oral Daily   apixaban  2.5 mg Oral BID   azaTHIOprine  100 mg Oral Daily   [START ON 09/12/2022] bumetanide  1 mg Oral Daily   Chlorhexidine Gluconate Cloth  6 each Topical Daily   cycloSPORINE  1 drop Both Eyes BID   folic acid  1 mg Oral Daily   hydrocerin   Topical Daily   lidocaine  1 patch Transdermal Q24H   pantoprazole  80 mg Oral Daily   predniSONE  5 mg Oral Q breakfast  sodium bicarbonate  650 mg Oral Daily   sodium chloride flush  10-40 mL Intracatheter Q12H   sodium chloride flush  3 mL Intravenous Q12H   Vericiguat  10 mg Oral Daily    Infusions:    PRN Medications: acetaminophen **OR** acetaminophen, alum & mag hydroxide-simeth, guaiFENesin-dextromethorphan, ondansetron **OR** ondansetron (ZOFRAN) IV, polyvinyl alcohol, senna-docusate, sodium chloride flush    Patient Profile  Tracy Nelson is a 77 y.o. AAF with history of rheumatoid arthritis, atrial fibrillation, CKD stage 3, post-herpetic neuralgia (shingles L upper back 3/23) and nonischemic cardiomyopathy. 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   Assessment/Plan   1. Acute on chronic mid-range (previously systolic) CHF:  Nonischemic cardiomyopathy.   - Echo 07/2022 EF improved 45-50%. RV normal  - RHC 12/21 low filling pressures and relatively preserved cardiac index at 2.22. - Echo 09/01/22 EF < 20% RV severely reduced - NYHA class IIIb/IV on admission - Continue Verquvo 10 mg daily.  - Repeat TTE on 09/01/22 with severe biventricular failure - hip repair ?nail precludes her from CMR.  - Suspect she is end-stage.  - CO-OX stable off milrinone.  - CVP 6 today. Will hold Bumex today, plan to restart tomorrow.  - Renal function now improving. 2.89>3.23>2.76  2. AKI on CKD IV - She is followed by Dr. Royce Macadamia. - Baseline 2.4-2.6?Marland Kitchen Most recently her baseline has been around 2.8-3.3 - Creatinine peaked 3.24--> today 2.89>3.23>2.76   3. Rheumatoid arthritis: No history of lung involvement. On low dose of prednisone chronically.    4. Atrial fibrillation/flutter: S/p TEE-DCCV in 4/22.   - Maintaining SR.  - Continue amio 200 mg daily.  - LFTs and TSH normal (11/23). - Continue Eliquis 2.5 mg twice a day. On lower dose with weight/creatinine   5. Shingles: Resolved.    6. Left Hip Fracture - S/P IM Nail 07/2022 - PT/OT-->HH. Plan to go back home with her daughter.    7. Swollen lymph nodes - primarily L side - white patches seen in back of throat - discussed with primary - strep (-)   8. Microcytic anemia - 2/2 plaquenil? - per primary - Hgb 7 on admission. s/p 1uPRBc 09/02/22 - Got feraheme 09/01/22   Appears stable for discharge pending MD eval. F/u scheduled in clinic  AHF meds at discharge: Amiodarone 200 mg daily Eliquis 2.5 mg BID Bumex 1 mg daily (to start 2/3) Vericiguat 10 mg daily   Length of Stay: [place], NP  09/11/2022, 8:42 AM  Advanced Heart Failure Team Pager [phone] (M-F; 7a - 5p)  Please contact [facility] for night-coverage after hours (5p -7a ) and weekends on [email or web address]  Patient seen with NP, agree with the above note.   Creatinine lower today at 2.76.   CVP 6.  No complaints.   General: NAD Neck: No JVD, no thyromegaly or thyroid nodule.  Lungs: Clear to auscultation bilaterally with normal respiratory effort. CV: Nondisplaced PMI.  Heart regular S1/S2, no S3/S4, no murmur.  No peripheral edema.   Abdomen: Soft, nontender, no hepatosplenomegaly, no distention.  Skin: Intact without lesions or rashes.  Neurologic: Alert and oriented x 3.  Psych: Normal affect. Extremities: No clubbing or cyanosis.  HEENT: Normal.   She is ready for home today.  She can restart bumetanide 1 mg daily tomorrow, see other discharge meds above.  Creatinine trending down.    Loralie Champagne 09/11/2022 1:55 PM

## 2022-09-14 ENCOUNTER — Other Ambulatory Visit: Payer: Self-pay | Admitting: Nurse Practitioner

## 2022-09-14 ENCOUNTER — Ambulatory Visit (INDEPENDENT_AMBULATORY_CARE_PROVIDER_SITE_OTHER): Payer: Medicare PPO

## 2022-09-14 ENCOUNTER — Telehealth: Payer: Self-pay

## 2022-09-14 ENCOUNTER — Ambulatory Visit: Payer: Medicare PPO | Admitting: Podiatry

## 2022-09-14 DIAGNOSIS — L89619 Pressure ulcer of right heel, unspecified stage: Secondary | ICD-10-CM | POA: Diagnosis not present

## 2022-09-14 DIAGNOSIS — Z7901 Long term (current) use of anticoagulants: Secondary | ICD-10-CM | POA: Diagnosis not present

## 2022-09-14 DIAGNOSIS — I5020 Unspecified systolic (congestive) heart failure: Secondary | ICD-10-CM

## 2022-09-14 DIAGNOSIS — D485 Neoplasm of uncertain behavior of skin: Secondary | ICD-10-CM | POA: Insufficient documentation

## 2022-09-14 DIAGNOSIS — M79674 Pain in right toe(s): Secondary | ICD-10-CM | POA: Diagnosis not present

## 2022-09-14 DIAGNOSIS — L97412 Non-pressure chronic ulcer of right heel and midfoot with fat layer exposed: Secondary | ICD-10-CM | POA: Diagnosis not present

## 2022-09-14 DIAGNOSIS — H269 Unspecified cataract: Secondary | ICD-10-CM | POA: Insufficient documentation

## 2022-09-14 DIAGNOSIS — B351 Tinea unguium: Secondary | ICD-10-CM | POA: Diagnosis not present

## 2022-09-14 DIAGNOSIS — M79675 Pain in left toe(s): Secondary | ICD-10-CM

## 2022-09-14 DIAGNOSIS — N814 Uterovaginal prolapse, unspecified: Secondary | ICD-10-CM | POA: Insufficient documentation

## 2022-09-14 DIAGNOSIS — L84 Corns and callosities: Secondary | ICD-10-CM | POA: Diagnosis not present

## 2022-09-14 MED ORDER — SANTYL 250 UNIT/GM EX OINT: 1.0000 | TOPICAL_OINTMENT | Freq: Every day | CUTANEOUS | 0 refills | Status: DC

## 2022-09-14 NOTE — Telephone Encounter (Signed)
[facility] called to report they have completed post-d/c visit on patient. They will continue PT and [facility] services with patient.   FYI only. Thanks!

## 2022-09-14 NOTE — Telephone Encounter (Signed)
350 pm.  New Palliative Care referral received.  Phone call made to patient to schedule home visit.  She is agreeable to Wednesday at 4.

## 2022-09-14 NOTE — Progress Notes (Signed)
Subjective:   [name], female   DOB: 77 y.o.   MRN: [id number]   HPI Chief Complaint  Patient presents with   Foot Ulcer    NP-heel ulcer - patient fell at thanksgiving and was in the hospital, this is where she got the pressure ulcer - patient was told that her compression socks stayed on too long and that's what cause the ulcer   Nail Problem    Thick painful toenails - patient takes Eliquis   Callouses    Painful corns     77 year old female presents the office today with above concerns.  Her main concern is that she has a wound on heel.  She denies any pus.  No swelling or redness.  Saw Dr. Roselie Awkward in the hospital on 08/11/2022.  She was found to have heel ulceration with fat layer exposed.  On exam no evidence of hemodynamically significant PAD on clinical exam.   Review of Systems  All other systems reviewed and are negative.  Past Medical History:  Diagnosis Date   Acid reflux 07/15/2020   Arthritis 07/15/2020   CHF (congestive heart failure) ([place])    Colitis 07/15/2020   Dilated cardiomyopathy ([place]) 07/15/2020   HFrEF (heart failure with reduced ejection fraction) ([place]) 07/15/2020   HTN (hypertension) 07/15/2020   Osteoporosis 07/15/2020   Palpitations 07/15/2020   Rheumatoid arthritis ([place]) 07/15/2020    Past Surgical History:  Procedure Laterality Date   CARDIOVERSION N/A 11/08/2020   Procedure: CARDIOVERSION;  Surgeon: [name], MD;  Location: [place];  Service: Cardiovascular;  Laterality: N/A;   INTRAMEDULLARY (IM) NAIL INTERTROCHANTERIC Left 07/10/2022   Procedure: INTRAMEDULLARY (IM) NAIL INTERTROCHANTERIC;  Surgeon: Leandrew Koyanagi, MD;  Location: [place];  Service: Orthopedics;  Laterality: Left;   RIGHT HEART CATH N/A 08/14/2020   Procedure: RIGHT HEART CATH;  Surgeon: [name], MD;  Location: [place] CV LAB;  Service: Cardiovascular;  Laterality: N/A;   RIGHT HEART CATH N/A 09/03/2022   Procedure: RIGHT HEART CATH;  Surgeon:  [name], MD;  Location: [place] [facility];  Service: Cardiovascular;  Laterality: N/A;   TEE WITHOUT CARDIOVERSION N/A 11/08/2020   Procedure: TRANSESOPHAGEAL ECHOCARDIOGRAM (TEE);  Surgeon: [name], MD;  [facility];  Service: Cardiovascular;  Laterality: N/A;     Current Outpatient Medications:    collagenase (SANTYL) 250 UNIT/GM ointment, Apply 1 Application topically daily., Disp: 90 g, Rfl: 0   amiodarone (PACERONE) 200 MG tablet, TAKE 1 TABLET BY MOUTH EVERY DAY (Patient taking differently: Take 200 mg by mouth daily.), Disp: 60 tablet, Rfl: 6   azaTHIOprine (IMURAN) 50 MG tablet, TAKE 2 TABLETS BY MOUTH EVERY DAY (Patient taking differently: Take 100 mg by mouth daily.), Disp: 60 tablet, Rfl: 0   bumetanide (BUMEX) 1 MG tablet, Take 1 tablet (1 mg total) by mouth daily., Disp: , Rfl:    calcitRIOL (ROCALTROL) 0.25 MCG capsule, Take 0.25 mcg by mouth daily., Disp: , Rfl:    ELIQUIS 2.5 MG TABS tablet, TAKE 1 TABLET BY MOUTH TWICE A DAY (Patient taking differently: Take 2.5 mg by mouth 2 (two) times daily.), Disp: 60 tablet, Rfl: 5   Ensure (ENSURE), Take 237 mLs by mouth daily as needed (for meal supplement)., Disp: , Rfl:    folic acid (FOLVITE) 1 MG tablet, TAKE 1 TABLET BY MOUTH EVERY DAY (Patient taking differently: Take 1 mg by mouth daily.), Disp: 30 tablet, Rfl: 11   hydroxypropyl methylcellulose /  hypromellose (ISOPTO TEARS / GONIOVISC) 2.5 % ophthalmic solution, Place 1 drop into both eyes as needed for dry eyes., Disp: , Rfl:    lidocaine (LIDODERM) 5 %, Place 1 patch onto the skin daily. Remove & Discard patch within 12 hours or as directed by MD (Patient not taking: Reported on 09/14/2022), Disp: 30 patch, Rfl: 0   Menthol, Topical Analgesic, (ICY HOT BACK EX), Apply 1 application topically daily as needed (pain)., Disp: , Rfl:    Multiple Vitamin (MULTIVITAMIN) tablet, Take 1 tablet by mouth daily., Disp: , Rfl:    omeprazole (PRILOSEC) 40 MG capsule,  Take 40 mg by mouth daily as needed (for acid reflux)., Disp: , Rfl:    polyethylene glycol (MIRALAX / GLYCOLAX) 17 g packet, Take 17 g by mouth daily as needed for mild constipation., Disp: , Rfl:    predniSONE (DELTASONE) 5 MG tablet, TAKE 1 TABLET BY MOUTH EVERY DAY WITH BREAKFAST (Patient taking differently: Take 5 mg by mouth daily with breakfast.), Disp: 30 tablet, Rfl: 1   Vericiguat (VERQUVO) 10 MG TABS, Take 10 mg by mouth daily., Disp: 90 tablet, Rfl: 3  Allergies  Allergen Reactions   Baclofen Nausea And Vomiting   Penicillin G     Other reaction(s): Unknown   Penicillins Rash           Objective:  Physical Exam  General: AAO x3, NAD  Dermatological: On the right heel ulceration measuring 2 x 1 cm wound is a fibrogranular wound base without any probing to bone and there is no undermining or tunneling.  There is no surrounding erythema, ascending cellulitis.  There is no fluctuance or crepitation.  There is no malodor.  Hyperkeratotic lesions noted on the medial right second toe, right fifth toe without any underlying ulceration drainage or signs of infection. Nails are hypertrophic, dystrophic, brittle, discolored, elongated 10. No surrounding redness or drainage. Tenderness nails 1-5 bilaterally.   Vascular: Dorsalis Pedis artery and Posterior Tibial artery pedal pulses are 1/4 bilateral with immedate capillary fill time.  There is no pain with calf compression, swelling, warmth, erythema.   Neruologic: Sensation decreased  Musculoskeletal: No pain on exam today      Assessment:   Heel ulceration right, symptomatic onychomycosis and hyperkeratotic lesions on Eliquis     Plan:  -Treatment options discussed including all alternatives, risks, and complications -Etiology of symptoms were discussed -Recommend Santyl to the wound which was prescribed today.  Instructions were given on how to apply this.  Offloading at all times to avoid excess pressure to facilitate  healing.  She previously been evaluated by vascular surgery with last ABIs in January so hold off on repeating this for now unless there is no improvement in the wound. -Sharply debrided nails x 10 without any complications or bleeding -Sharply debrided the hyperkeratotic lesions x 2 without any complications or bleeding.  Trula Slade DPM

## 2022-09-14 NOTE — Telephone Encounter (Signed)
Transition Care Management Follow-up Telephone Call Date of discharge and from where: [place] 09-11-22 Dx: acute on chronic systolic CHF  How have you been since you were released from the hospital? Getting settled in at home  Any questions or concerns? No  Items Reviewed: Did the pt receive and understand the discharge instructions provided? Yes  Medications obtained and verified? Yes  Other? No  Any new allergies since your discharge? No  Dietary orders reviewed? Yes Do you have support at home? Yes   Home Care and Equipment/Supplies: Were home health services ordered? yes If so, what is the name of the agency? [facility]   Has the agency set up a time to come to the patient's home? no Were any new equipment or medical supplies ordered?  No What is the name of the medical supply agency? na Were you able to get the supplies/equipment? not applicable Do you have any questions related to the use of the equipment or supplies? No  Functional Questionnaire: (I = Independent and D = Dependent) ADLs: I  Bathing/Dressing- I  Meal Prep- I  Eating- I  Maintaining continence- I  Transferring/Ambulation- I- WALKER   Managing Meds- I  Follow up appointments reviewed:  PCP Hospital f/u appt confirmed? No- pt will call back to make fu appt prior to 09-25-22 once she talks with her family that would bring her  [facility] f/u appt confirmed? Yes  Scheduled to see heart and vascular  on 10-01-22 @ 1030am. Are transportation arrangements needed? No  If their condition worsens, is the pt aware to call PCP or go to the Emergency Dept.? Yes Was the patient provided with contact information for the PCP's office or ED? Yes Was to pt encouraged to call back with questions or concerns? Yes   Juanda Crumble LPN [place] Direct Dial [phone]

## 2022-09-14 NOTE — Patient Instructions (Signed)
Wash wound with saline (do not use wound cleanser). Apply the santyl the thickness of a nickel and then cover with a small amount of saline moistened gauze. Change daily.

## 2022-09-15 ENCOUNTER — Other Ambulatory Visit: Payer: Self-pay | Admitting: Nurse Practitioner

## 2022-09-15 ENCOUNTER — Encounter (HOSPITAL_COMMUNITY): Payer: Medicare PPO

## 2022-09-15 DIAGNOSIS — I5020 Unspecified systolic (congestive) heart failure: Secondary | ICD-10-CM

## 2022-09-16 ENCOUNTER — Other Ambulatory Visit: Payer: Medicare PPO

## 2022-09-16 ENCOUNTER — Other Ambulatory Visit (HOSPITAL_COMMUNITY): Payer: Self-pay | Admitting: Cardiology

## 2022-09-16 ENCOUNTER — Other Ambulatory Visit: Payer: Self-pay | Admitting: Internal Medicine

## 2022-09-16 DIAGNOSIS — M0579 Rheumatoid arthritis with rheumatoid factor of multiple sites without organ or systems involvement: Secondary | ICD-10-CM

## 2022-09-16 DIAGNOSIS — Z515 Encounter for palliative care: Secondary | ICD-10-CM

## 2022-09-16 DIAGNOSIS — H16003 Unspecified corneal ulcer, bilateral: Secondary | ICD-10-CM

## 2022-09-16 NOTE — Progress Notes (Signed)
COMMUNITY PALLIATIVE CARE SW NOTE  PATIENT NAME: Tracy Nelson DOB: 09/18/1945 MRN: [id number]  PRIMARY CARE PROVIDER: [name], [name]  RESPONSIBLE PARTY:  Acct ID - Guarantor Home Phone Work Phone Relationship Acct Type  [id number] MADALEE, [phone]  Self P/F     [address], [place], [place] [id number]     PLAN OF CARE and INTERVENTIONS:             GOALS OF CARE/ ADVANCE CARE PLANNING:    Goals include to maximize quality of life and assist with pain management. Our advance care planning conversation included a discussion about:    The value and importance of advance care planning  Review and updating or creation of an advance directive document.                          Patient is a Full code. ACP discussed, patient wishes to have POA documents mailed to her and will have daughter, [name], as health care agent.   2.        SOCIAL/EMOTIONAL/SPIRITUAL ASSESSMENT/ INTERVENTIONS:          Palliative care encounter: SW and RN completed initial joint in home visit with patient.   Functional changes/updates: Patient with recent hospitalization due to CHF and DC to home with daughter on 09/11/22.   Mobility: patient is ambulatory (I) with RW and other ADL's.  Hospice: PC discussed hospice while patient was in the hospital, patient and family declined service at that time.   PC will continue to monitor.    Psychosocial assessment: completed.   In home support: patient lives daughter and SIL and has [place] in the home. Has private caregiver, April, 9am-3pm.   Transportation: no needs.  Food: no food insecurities witnessed.   Safety and long term planning: patient feels safe in her home and desires to remain in home with daughter. Patient has life alert button.   SW discussed goals, reviewed care plan, provided emotional support, used active and reflective listening in the form of reciprocity emotional response. Questions and concerns were addressed. The  patient/family was encouraged to call with any additional questions and/or concerns. PC Provided general support and encouragement, no other unmet needs identified. Will continue to follow.   3.         PATIENT/CAREGIVER EDUCATION/ COPING:   Appearance: well groomed, appropriate given situation  Mental Status: Alert/oriented. Eye Contact: good  Thought Process: rational  Thought Content: not assessed  Speech: normal  Mood: Normal and calm Affect: Congruent to endorsed mood, full ranging Insight: normal Judgement: normal  Interaction Style: Cooperative   Patient A&O, patient engaged in fluent conversation and answered all questions appropriately. No cognitive deficits witnessed. Patient expressed the possbil;e desire for counseling, however, not interested in being connected with anyone at this time.     4.         PERSONAL EMERGENCY PLAN:  Patient will call 9-1-1 for emergencies.    5.         COMMUNITY RESOURCES COORDINATION/ HEALTH CARE NAVIGATION:  patients daughter and caregiver manages her care.    6.      FINANCIAL CONCERNS/NEEDS: none                          Primary Health Insurance:  [employer] Secondary Health Insurance: none Prescription Coverage: Yes, no history of difficulty obtaining or affording prescriptions reported.  SOCIAL HX:  Social History   Tobacco Use   Smoking status: Never   Smokeless tobacco: Never  Substance Use Topics   Alcohol use: Never    CODE STATUS: FULL CODE  ADVANCED DIRECTIVES: discussed. Will mail forms MOST FORM COMPLETE:  N HOSPICE EDUCATION PROVIDED: N  PPS: patient is (I) with ADLs, has assistance with ADL's.        Doreene Eland, [place]

## 2022-09-17 NOTE — Progress Notes (Signed)
PATIENT NAME: Tracy Nelson DOB: 10/28/1945 MRN: [id number]  PRIMARY CARE PROVIDER: [name], [name]  RESPONSIBLE PARTY:  Acct ID - Guarantor Home Phone Work Phone Relationship Acct Type  [id number] [name], [name] [phone]  Self P/F     [address], [place], [place] [address]   Home visit completed with patient.  Connected virtually with [place], SW.  ACP:  Full code.  Patient advises she is fully aware of likely poor outcome should CPR be administered.  She would like to remain a full code.  We discussed advance directives/living will.  Patient thought HCPOA was completed in the hospital but unable to locate documents.  SW will send out new forms for patient.  Patient and daughter have discussed her medical situation and patient has expressed her wishes to daughter.  Appetite:  Patient endorses poor appetite.  She consuming a supplement drink daily.  Typically eating 1-2 small meals a day.  She usually does not eat dinner but will have a small snack.   Heart Failure:  Patient reports weighing herself daily.  Typically, weight ranges between 96-100 lbs.  She has not weighed today but yesterday's weight was 96 lbs.  Palliative Care vs Hospice:  Discussion completed on purpose of palliative care services and differences with hospice services.  Patient was evaluated by Palliative Care during her last hospitalization and believed to be appropriate for hospice.  Patient is not ready for hospice services at this time.  She states she wants to live and is struggling to process through her current medical situation.  Patient spoke at length about her last 2 years of living in [place] with her daughter.  Sometimes feeling like she is a burden to her family and noting the decline in her health since she moved here.  Majority of this visit was spent allowing patient to share her story and concerns.  Safety:  Patient has a Building services engineer with her from 9-4 Monday-Friday.  She is typically by herself  for 2-3 hours until her daughter arrives in the evening. She has a life alert in place.  Patient advises she usually remains in her bed on only goes back and forth to the bathroom during the day.  Follow up visit scheduled for 10/06/22.  Lorenza Burton, RN

## 2022-09-18 ENCOUNTER — Telehealth (HOSPITAL_COMMUNITY): Payer: Self-pay | Admitting: *Deleted

## 2022-09-25 ENCOUNTER — Other Ambulatory Visit (HOSPITAL_COMMUNITY): Payer: Self-pay | Admitting: Cardiology

## 2022-09-28 ENCOUNTER — Ambulatory Visit: Payer: Medicare PPO | Admitting: Podiatry

## 2022-09-28 DIAGNOSIS — B351 Tinea unguium: Secondary | ICD-10-CM | POA: Diagnosis not present

## 2022-09-28 DIAGNOSIS — L97412 Non-pressure chronic ulcer of right heel and midfoot with fat layer exposed: Secondary | ICD-10-CM | POA: Diagnosis not present

## 2022-09-28 DIAGNOSIS — Z7901 Long term (current) use of anticoagulants: Secondary | ICD-10-CM | POA: Diagnosis not present

## 2022-09-28 MED ORDER — SANTYL 250 UNIT/GM EX OINT: 1.0000 | TOPICAL_OINTMENT | Freq: Every day | CUTANEOUS | 1 refills | Status: DC

## 2022-09-28 NOTE — Progress Notes (Signed)
Office Visit Note  Patient: Tracy Nelson             Date of Birth: [birth year]           MRN: [id number]             PCP: [name], [name] Referring: [name], [name] Visit Date: 09/29/2022   Subjective:  Follow-up (Needs refill on prednisone)   History of Present Illness: Tracy Nelson is a 77 y.o. 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  Hydroxychloroquine was previously discontinued that she took a much of last year but had concern by ophthalmologist for safety of retinal toxicity monitoring.  She is recently out of the prednisone due to medication refill.  Since stopping the medication she is noticing substantial increase in fatigue and joint pain and stiffness worse in multiple areas.  Especially bilateral upper extremities doing worse.  Previous HPI 09/23/21 Tracy Nelson is a 76 y.o. female here for follow up for seronegative RA complicated with corneal ulcerations we increased azathioprine dose to 75 mg daily and increased prednisone temporarily. I discussed plan with Dr. Patrice Paradise also planning to discontinue hydroxychloroquine over concern about risk and ability to monitor safety. She continues to have eye inflammation worse on the left side. She notices some eye pain and headaches associated with blurry vision. Often around using eye drops or first thing in the morning. She had lab work last month for Dr. Benjamine Mola with normal blood counts renal function appeared slightly worse.   Previous HPI 08/20/21 Tracy Nelson is a 77 y.o. female here for follow up for seronegative RA on HCQ 200 mg daily and prednisone 5 mg daily. Labs checked at hospital December 2022 with eGFR stable at 29. Joint symptoms are not significantly changed since last visit. However she saw ophthalmology in December with increased eye irritation and redness found to  have ulceration in the left eye consistent with active RA. She also continued restasis and artificial tears for severe dryness. She reports seeing some darkening or rash on her face does not have any abnormal sensation.   Previous HPI 01/09/21 Tracy Nelson is a 77 y.o. female here for seronegative rheumatoid arthritis previously a patient with Dr. Francee Nodal in [place], [place]. She currently taken hydroxychloroquine 200 mg PO daily and prednisone 5 mg PO daily and has not experienced a severe flare or increase in steroid dosing since about a year ago.  She also experiences sicca symptoms currently treated with Restasis.  She was previously taking Imuran but is not clear if she is still taking this and how much is contributory to disease control. 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  She has also previously undergone some fibromyalgia tender point injection with significant benefit of back pain symptoms. 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 She has upcoming nephrology appointment next week for her CKD and due to frequent hard sticks prefers if lab draws may be consolidated with frequent bruising and discomfort associated.   Review of Systems  Constitutional:  Positive for fatigue.  HENT:  Positive for mouth dryness. Negative for mouth sores.   Eyes:  Positive for dryness.  Respiratory:  Positive for shortness of breath.   Cardiovascular:  Positive for palpitations.  Gastrointestinal:  Negative for blood in stool, constipation  and diarrhea.  Endocrine: Negative for increased urination.  Genitourinary:  Negative for involuntary urination.  Musculoskeletal:  Positive for joint pain, gait problem, joint pain, joint swelling, myalgias, muscle weakness, morning stiffness, muscle tenderness and myalgias.  Skin:  Positive for hair loss. Negative  for color change, rash and sensitivity to sunlight.  Allergic/Immunologic: Negative for susceptible to infections.  Neurological:  Positive for dizziness. Negative for headaches.  Hematological:  Negative for swollen glands.  Psychiatric/Behavioral:  Negative for depressed mood and sleep disturbance. The patient is not nervous/anxious.     PMFS History:  Patient Active Problem List   Diagnosis Date Noted   Cataract 09/14/2022   Neoplasm of uncertain behavior of skin 09/14/2022   Uterine prolapse 09/14/2022   Congestive heart failure ([place]) 09/03/2022   Pressure ulcer 09/03/2022   Acute on chronic HFrEF (heart failure with reduced ejection fraction) ([place]) 08/30/2022   Elevated d-dimer 08/30/2022   Anemia of chronic disease 08/30/2022   Paroxysmal atrial fibrillation ([place]) [id number]   Chronic systolic CHF (congestive heart failure) ([name]) 07/13/2022   CKD (chronic kidney disease), stage IV ([place]) 07/09/2022   Displaced intertrochanteric fracture of left femur, initial encounter for closed fracture ([place]) 07/08/2022   Corneal melt, 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   Monoclonal gammopathy 03/10/2021   Hypotension 10/22/2020   Atypical chest pain 10/22/2020   CKD (chronic kidney disease), stage III ([place]) 10/22/2020   Nausea & vomiting 10/22/2020   Physical deconditioning 10/22/2020   Atrial flutter ([place]) 10/18/2020   Arthritis 07/15/2020   GERD (gastroesophageal reflux disease) 07/15/2020   Colitis 07/15/2020   Non-ischemic cardiomyopathy ([facility]) 07/15/2020   HFrEF (heart failure with reduced ejection fraction) ([facility]) 07/15/2020   HTN (hypertension) 07/15/2020   Osteoporosis 07/15/2020   Palpitations 07/15/2020   Acute gout of left shoulder 07/10/2020   Raynaud's disease 05/05/2019   Rheumatoid arthritis involving multiple sites with positive rheumatoid factor ([place]) 10/28/2015   Mitral regurgitation 05/30/2014   Acute on chronic combined systolic (congestive) and diastolic (congestive) heart  failure ([facility]) 05/19/2014   Vitamin D deficiency 05/15/2013   Encounter for long-term (current) use of other medications 12/14/2008   Sjogren's syndrome ([facility]) 12/14/2008    Past Medical History:  Diagnosis Date   Acid reflux 07/15/2020   Arthritis 07/15/2020   CHF (congestive heart failure) ([facility] [place])    Colitis 07/15/2020   Dilated cardiomyopathy ([place] [facility]) 07/15/2020   HFrEF (heart failure with reduced ejection fraction) ([facility]) 07/15/2020   HTN (hypertension) 07/15/2020   Osteoporosis 07/15/2020   Palpitations 07/15/2020   Rheumatoid arthritis ([place]) 07/15/2020    Family History  Problem Relation Age of Onset   Hypertension Mother    Diabetes Mother    Hypertension Father    Diabetes Father    Breast cancer Maternal Aunt    Breast cancer Paternal Aunt    Arthritis Maternal Grandmother    Lung disease Paternal Grandfather    Cancer Brother    Past Surgical History:  Procedure Laterality Date   CARDIOVERSION N/A 11/08/2020   Procedure: CARDIOVERSION;  Surgeon: [name], MD;  Location: [facility];  Service: Cardiovascular;  Laterality: N/A;   HIP FRACTURE SURGERY     INTRAMEDULLARY (IM) NAIL INTERTROCHANTERIC Left 07/10/2022   Procedure: INTRAMEDULLARY (IM) NAIL INTERTROCHANTERIC;  Surgeon: Leandrew Koyanagi, MD;  Location: [facility];  Service: Orthopedics;  Laterality: Left;   RIGHT HEART CATH N/A 08/14/2020   Procedure: RIGHT HEART CATH;  Surgeon: [name], MD;  Location: [facility];  Service: Cardiovascular;  Laterality:  N/A;   RIGHT HEART CATH N/A 09/03/2022   Procedure: RIGHT HEART CATH;  Surgeon: [name], MD;  Location: [facility];  Service: Cardiovascular;  Laterality: N/A;   TEE WITHOUT CARDIOVERSION N/A 11/08/2020   Procedure: TRANSESOPHAGEAL ECHOCARDIOGRAM (TEE);  Surgeon: [name], MD;  [facility];  Service: Cardiovascular;  Laterality: N/A;   Social History   Social History Narrative   Not on file   There is no immunization  history for the selected administration types on file for this patient.   Objective: Vital Signs: BP 115/79 (BP Location: Right Arm, Patient Position: Sitting, Cuff Size: Normal)   Pulse 73   Resp 12   Ht '5\' 4"'$  (1.626 m)   Wt 104 lb (47.2 kg)   BMI 17.85 kg/m    Physical Exam Eyes:     Conjunctiva/sclera: Conjunctivae normal.  Cardiovascular:     Rate and Rhythm: Normal rate and regular rhythm.  Pulmonary:     Effort: Pulmonary effort is normal.     Breath sounds: Normal breath sounds.  Musculoskeletal:     Right lower leg: No edema.     Left lower leg: No edema.  Lymphadenopathy:     Cervical: No cervical adenopathy.  Skin:    General: Skin is warm and dry.  Neurological:     Mental Status: She is alert.  Psychiatric:        Mood and Affect: Mood normal.      Musculoskeletal Exam:  Right shoulder range of motion intact with tenderness to pressure anteriorly, left shoulder range of motion restricted with guarding in all planes no palpable swelling Elbows full ROM no tenderness or swelling Right hand MCP joint subluxation and lateral deviation with no palpable synovitis, left MCP joints with fully reducible lateral deviation Knees full ROM no tenderness or swelling   Investigation: No additional findings.  Imaging: No results found.  Recent Labs: Lab Results  Component Value Date   WBC 5.6 09/11/2022   HGB 8.3 (L) 09/11/2022   PLT 249 09/11/2022   NA 138 09/11/2022   K 4.1 09/11/2022   CL 106 09/11/2022   CO2 24 09/11/2022   GLUCOSE 85 09/11/2022   BUN 64 (H) 09/11/2022   CREATININE 2.76 (H) 09/11/2022   BILITOT 1.1 09/01/2022   ALKPHOS 96 09/01/2022   AST 26 09/01/2022   ALT 21 09/01/2022   PROT 6.2 (L) 09/01/2022   ALBUMIN 3.2 (L) 09/01/2022   CALCIUM 8.8 (L) 09/11/2022    Speciality Comments: No specialty comments available.  Procedures:  No procedures performed Allergies: Baclofen, Penicillin g, and Penicillins   Assessment / Plan:      Visit Diagnoses: Rheumatoid arthritis involving multiple sites with positive rheumatoid factor (HCC)  Sjogren's syndrome, with unspecified organ involvement ([place]) - Plan: Sedimentation rate, C-reactive protein, Sjogrens syndrome-A extractable nuclear antibody, Rheumatoid factor  Questionable primary sjogren syndrome vs secondary sicca symptoms due to RA. Joint pain in several areas but without erosive disease or palpable synovitis. Checking again serum inflammatory markers also SSA antibody and rheumatoid factor.  Plan to continue azathioprine 100 mg daily and resume prednisone 5 mg daily.  Raynaud's disease without gangrene  Has symptoms during past few months but without any digital pitting lesions or ischemic injury.  Corneal melt, bilateral - Plan: predniSONE (DELTASONE) 5 MG tablet, azaTHIOprine (IMURAN) 50 MG tablet  No new severe lesions from [facility] review or per patient, she is following up with ophthalmology for this.  Orders: Orders  Placed This Encounter  Procedures   Sedimentation rate   C-reactive protein   Sjogrens syndrome-A extractable nuclear antibody   Rheumatoid factor   Meds ordered this encounter  Medications   predniSONE (DELTASONE) 5 MG tablet    Sig: Take 1 tablet (5 mg total) by mouth daily with breakfast.    Dispense:  90 tablet    Refill:  1   azaTHIOprine (IMURAN) 50 MG tablet    Sig: Take 2 tablets (100 mg total) by mouth daily.    Dispense:  180 tablet    Refill:  1     Follow-Up Instructions: Return in about 6 months (around 03/30/2023) for RA/Sjogren on AZA/GC.   [name], MD  Note - This record has been created using [employer].  Chart creation errors have been sought, but may not always  have been located. Such creation errors do not reflect on  the standard of medical care.

## 2022-09-28 NOTE — Progress Notes (Unsigned)
Subjective: Chief Complaint  Patient presents with   Foot Ulcer    Follow up right heel ulcer   Its not been dresed as much as it should, every 3 days.    Objective: AAO x3, NAD DP/PT pulses palpable bilaterally, CRT less than 3 seconds Protective sensation intact with Simms Weinstein monofilament, vibratory sensation intact, Achilles tendon reflex intact No areas of pinpoint bony tenderness or pain with vibratory sensation. MMT 5/5, ROM WNL. No edema, erythema, increase in warmth to bilateral lower extremities.  No open lesions or pre-ulcerative lesions.  No pain with calf compression, swelling, warmth, erythema  Assessment:  Plan: -All treatment options discussed with the patient including all alternatives, risks, complications.   -Patient encouraged to call the office with any questions, concerns, change in symptoms.

## 2022-09-29 ENCOUNTER — Encounter: Payer: Self-pay | Admitting: Internal Medicine

## 2022-09-29 ENCOUNTER — Ambulatory Visit: Payer: Medicare PPO | Attending: Internal Medicine | Admitting: Internal Medicine

## 2022-09-29 VITALS — BP 115/79 | HR 73 | Resp 12 | Ht 64.0 in | Wt 104.0 lb

## 2022-09-29 DIAGNOSIS — M810 Age-related osteoporosis without current pathological fracture: Secondary | ICD-10-CM

## 2022-09-29 DIAGNOSIS — M0579 Rheumatoid arthritis with rheumatoid factor of multiple sites without organ or systems involvement: Secondary | ICD-10-CM | POA: Diagnosis not present

## 2022-09-29 DIAGNOSIS — I7300 Raynaud's syndrome without gangrene: Secondary | ICD-10-CM

## 2022-09-29 DIAGNOSIS — M3500 Sicca syndrome, unspecified: Secondary | ICD-10-CM | POA: Diagnosis not present

## 2022-09-29 DIAGNOSIS — H16003 Unspecified corneal ulcer, bilateral: Secondary | ICD-10-CM

## 2022-09-29 MED ORDER — PREDNISONE 5 MG PO TABS: 5.0000 mg | ORAL_TABLET | Freq: Every day | ORAL | 1 refills | Status: DC

## 2022-09-29 MED ORDER — AZATHIOPRINE 50 MG PO TABS: 100.0000 mg | ORAL_TABLET | Freq: Every day | ORAL | 1 refills | Status: DC

## 2022-09-30 LAB — RHEUMATOID FACTOR: Rheumatoid fact SerPl-aCnc: 19 IU/mL — ABNORMAL HIGH (ref <14)

## 2022-09-30 LAB — SEDIMENTATION RATE: Sed Rate: 46 mm/h — ABNORMAL HIGH (ref 0–30)

## 2022-09-30 LAB — C-REACTIVE PROTEIN: CRP: 1.9 mg/L (ref <8.0)

## 2022-09-30 LAB — SJOGRENS SYNDROME-A EXTRACTABLE NUCLEAR ANTIBODY: SSA (Ro) (ENA) Antibody, IgG: <1 AI

## 2022-10-01 ENCOUNTER — Encounter (HOSPITAL_COMMUNITY): Payer: Medicare PPO

## 2022-10-01 NOTE — Progress Notes (Incomplete)
Advanced Heart Failure Clinic Progress Note   PCP: [name], [place] Nephrology: Dr. Royce Macadamia HF Cardiologist: Dr. Aundra Dubin  77 y.o. with history of rheumatoid arthritis, CKD stage 3, and nonischemic cardiomyopathy was referred by Dr. Andrey [place] in [place] to establish heart failure care in [place].  Patient has been known to have a cardiomyopathy since 2015.  Cardiac MRI in 2015 showed EF 37% with no LGE.  Coronary angiography at that time showed no significant coronary disease.  Over the next few years, LV EF fluctuated up and down.  In 3/21, echo showed EF down to 20-25%.  She was admitted to the hospital in [place], [place] in 11/21 with CHF exacerbation.  Echo showed EF 20-25%, global hypokinesis, mildly decreased RV systolic function.  Creatinine was noted to be elevated. She was diuresed and sent home, but was readmitted later in 11/21 with ongoing volume overload. Creatinine was up to 2.89.  She was taken off most of her cardiac meds due to soft BP and elevated creatinine. She was discharged to her daughter's house in [place].   Repeated an echo on her in 12/21, EF < 20% with moderate LV dilation and mildly decreased RV systolic function. RHC was done, surprisingly showing low filling pressures and relatively preserved cardiac index at 2.22.   She was admitted in 3/22 with atrial fibrillation, converted to NSR on amiodarone. She went into atrial flutter after this and had TEE-DCCV in 4/22.  TEE showed EF < 20%, moderate LV dilation, mild RV dilation with moderate RV dysfunction, moderate central MR.  CPX in 3/22 showed severe HF limitation.  PYP scan 4/22 was grade 1 with H/CL 1.48, equivocal.  PYP scan repeated in 3/23 and likely negative.   She was evaluated for [facility] or ANTHEM, BNP too high and thought to be too frail.   Cardiac MRI in 10/22 showed mild LV dilation with EF 23%, normal RV size with RVEF 37%, ECV 34%, small area of subendocardial LGE in the mid inferolateral wall  and small area of full thickness LGE in the basal  inferior wall.   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    Follow up 6/23, BP low and midodrine started. Discussed [place]. Felt to be near end-stage HF.  Admitted 07/08/22 after mechanical fall resulting in left hip fracture. S/P left intramedullary nail 07/10/22.  Post op developed AKI. Given IV fluids . Diuretics adjusted. Echo showed EF improved to 45%. Discharged to [place].   Readmitted 1/24 for a/c CHF w/ low output, requiring milrinone. Echo repeated, EF back down < 20%, RV severely reduced, moderate MR. RHC showed good cardiac output on milrinone, mildly elevated right and left heart filling pressures with mild pulmonary venous hypertension. She was further diuresed then transitioned back to oral bumex. Milrinone weaned off and tolerated ok w/ stable co-ox. GDMT once again limited by soft BP and renal fx. Discharged on 2/2, d/c wt ***  She presents today for post hospital f/u.       ECG: ***  Labs (12/21): K 5 => 3.7, creatinine 2.89 => 1.9 => 1.69, BNP 2904 Labs (1/22): K 4, creatinine 1.9, hgb 12.5 Labs (3/22): K 3.6, creatinine 2.36, hgb 11.6, TSH normal Labs (4/22): K 3.7, creatinine 2.25, LFTs normal, urine immunofixation normal, pro-BNP 6498 Labs (7/22): Myeloma panel with IgG monoclonal protein Labs (10/22): K 4.1, creatinine 1.96 Labs (12/22): BNP 549, TSH normal, K 4.3, creatinine 1.78 Labs (1/23): SCr 2.61, K  4.0, Hgb 11.4  Lab (3/23): Scr 2.29, K 3.9  Labs (6/23): K 4.1, creatinine 3.82, normal LFTs, normal TSH, hgb 9.4 Labs (07/15/22): K 4.4 Creatinine 2.78  Labs (1/24): K 4.1, Scr 2.76   PMH: 1. GERD 2. Rheumatoid arthritis: Followed by rheumatology in [place].  - No known pulmonary disease from RA.  3. Osteoporosis. 4. Gout 5. CKD stage 4 6. H/o SVT 7. Chronic systolic CHF: She was found to have nonischemic cardiomyopathy with low EF in 2015.  - LHC  (2015): Normal coronaries.  - cardiac MRI (2015): EF 37%, no LGE - Echo (3/21): EF 20-25%. - Echo (11/21): EF 20-25%, moderate LV dilation, global hypokinesis, biatrial enlargement, mildly decreased RV systolic function.  - Echo (12/21): EF < 20%, moderate LV dilation, normal RV size/systolic function, moderate MR, IVC normal.  - RHC (1/22): Mean RA 1, PA 39/14, mean PCWP 10, PVR 3.9 WU, CI 2.22.  - CPX (3/22): peak VO2 10.1, VE/VCO2 55, RER 1.13.  Severe functional limitation due to HF.  - TEE (4/22): EF < 20%, moderate LV dilation, mild RV dilation with moderate RV dysfunction, moderate central MR. - PYP scan (4/22): grade 1 with H/CL 1.48. Equivocal. [employer]ae gene testing negative. - Cardiac MRI (10/22): mild LV dilation with EF 23%, normal RV size with RVEF 37%, ECV 34%, small area of subendocardial LGE in the mid inferolateral wall and small area of full thickness LGE in the basal inferior wall.  - PYP scan (3/23): grade 1, H/CL 1.3.  Probably negative.  - Echo 07/11/22: EF 45-50% RV normal  8. Atrial fibrillation/flutter: TEE-DCCV in 4/22.  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  10. Varicella zoster with post-herpetic neuralgia 11. L hip fracture , mechanical fall.   SH: From [place] but recently moved to [place] to live with daughter.  Has 3 children.  Nonsmoker.  No ETOH.   FH: Mother with CHF, father with MI.   ROS: All systems reviewed and negative except as per HPI.   Current Outpatient Medications  Medication Sig Dispense Refill   amiodarone (PACERONE) 200 MG tablet TAKE 1 TABLET BY MOUTH EVERY DAY (Patient taking differently: Take 200 mg by mouth daily.) 60 tablet 6   azaTHIOprine (IMURAN) 50 MG tablet Take 2 tablets (100 mg total) by mouth daily. 180 tablet 1   bumetanide (BUMEX) 1 MG tablet Take 1 tablet (1 mg total) by mouth daily.     calcitRIOL (ROCALTROL) 0.25 MCG capsule Take 0.25 mcg by mouth daily.     collagenase (SANTYL) 250 UNIT/GM  ointment Apply 1 Application topically daily. (Patient not taking: Reported on 09/29/2022) 90 g 1   ELIQUIS 2.5 MG TABS tablet TAKE 1 TABLET BY MOUTH TWICE A DAY (Patient taking differently: Take 2.5 mg by mouth 2 (two) times daily.) 60 tablet 5   Ensure (ENSURE) Take 237 mLs by mouth daily as needed (for meal supplement).     folic acid (FOLVITE) 1 MG tablet TAKE 1 TABLET BY MOUTH EVERY DAY (Patient taking differently: Take 1 mg by mouth daily.) 30 tablet 11   hydroxypropyl methylcellulose / hypromellose (ISOPTO TEARS / GONIOVISC) 2.5 % ophthalmic solution Place 1 drop into both eyes as needed for dry eyes.     lidocaine (LIDODERM) 5 % Place 1 patch onto the skin daily. Remove & Discard patch within 12 hours or as directed by MD 30 patch 0   Menthol, Topical Analgesic, (ICY HOT BACK EX) Apply 1 application topically daily as  needed (pain).     Multiple Vitamin (MULTIVITAMIN) tablet Take 1 tablet by mouth daily.     omeprazole (PRILOSEC) 40 MG capsule Take 40 mg by mouth daily as needed (for acid reflux).     polyethylene glycol (MIRALAX / GLYCOLAX) 17 g packet Take 17 g by mouth daily as needed for mild constipation.     predniSONE (DELTASONE) 5 MG tablet Take 1 tablet (5 mg total) by mouth daily with breakfast. 90 tablet 1   VERQUVO 10 MG TABS TAKE 1 TABLET BY MOUTH EVERY DAY 90 tablet 3   No current facility-administered medications for this visit.   Wt Readings from Last 3 Encounters:  09/29/22 47.2 kg (104 lb)  09/10/22 48.9 kg (107 lb 11.2 oz)  08/11/22 45.4 kg (100 lb)   There were no vitals taken for this visit.  PHYSICAL EXAM: General:  Well appearing. No respiratory difficulty HEENT: normal Neck: supple. no JVD. Carotids 2+ bilat; no bruits. No lymphadenopathy or thyromegaly appreciated. Cor: PMI nondisplaced. Regular rate & rhythm. No rubs, gallops or murmurs. Lungs: clear Abdomen: soft, nontender, nondistended. No hepatosplenomegaly. No bruits or masses. Good bowel  sounds. Extremities: no cyanosis, clubbing, rash, edema Neuro: alert & oriented x 3, cranial nerves grossly intact. moves all 4 extremities w/o difficulty. Affect pleasant.   Assessment/Plan:   1. Chronic systolic CHF: Nonischemic cardiomyopathy.  This has been known since 2015, cath in 2015 showed no coronary disease and cardiac MRI in 2015 showed no LGE.  Cause is uncertain, familial cardiomyopathy is a concern given nonischemic cardiomyopathy in her mother.  Cannot rule out remote viral myocarditis.Poor appetite and severe HF limitation on CPX in 3/22 is concerning, as is elevated creatinine (suspect cardiorenal syndrome).  Low BP and elevated creatinine have limited her cardiac med regimen. RHC (1/22) showed low filling pressures and actually a relatively preserved cardiac index of 2.22.  PYP scan in 4/22 was equivocal, PYP scan repeated in 3/23 and likely negative.  Cardiac MRI in 10/22 showed mild LV dilation with EF 23%, normal RV size with RVEF 37%, ECV 34%, small area of subendocardial LGE in the mid inferolateral wall and small area of full thickness LGE in the basal inferior wall.   This was not suggestive of cardiac amyloidosis but possibly could be consistent with coronary embolism (though no LV thrombus visualized) versus prior myocarditis versus sarcoidosis. Recent admit 1/24 w/ a/c CHF w/ low output requiring milrinone. Echo EF < 20%, RV severely reduced, moderate MR. RHC showed good cardiac output on milrinone, mildly elevated right and left heart filling pressures with mild pulmonary venous hypertension. Diuresed w/ inotropic support and tolerated milrinone wean ok.  - Suspect nearing end-stage HF  - With renal failure, CHF, and atrial arrhythmias, we have assessed for cardiac amyloidosis.  PYP scan was equivocal in 4/22 for transthyretin cardiac amyloidosis.  It was repeated in 3/23 and likely negative.  Inv[employer]ae gene testing for TTR mutations was negative. The cardiac MRI was not  consistent with amyloidosis.  She has MGUS with monoclonal IgG paraprotein, but based on cMRI and slow progression, think AL amyloidosis is unlikely.   - Functional class difficult to assess with hip fracture. NYHA II? - Volume status stable. Continue Bumex 1 mg daily  - Continue midodrine 2.5 mg tid. BP improved today. - Continue Verquvo 10 mg daily.  - Off dapagliflozin with rise in creatinine, with low BP would not restart.   - Off spironolactone with elevated creatinine and soft BP.  -  Does not have an ICD,and she is out of range with EF 45-50% .  - Had recent BMET will need to obtain copy.  2. CKD: Stage 4.  She is followed by Dr. Royce Macadamia. - B/l  SCr 2.7-3.3 - Check BMP today  3. Rheumatoid arthritis: No history of lung involvement. She has been on a low dose of prednisone chronically.  - followed by rheumatology  4. Atrial fibrillation/flutter: S/p TEE-DCCV in 4/22.  She is now on amiodarone. EKG today shows   - Continue amiodarone 200 mg daily. LFTs and TSH normal (11/23).  Will need regular eye exam. Regular on exam.  - Continue Eliquis 5 mg bid.  - Repeat LFT/TFTs next visit 5. Shingles:Resolved.  6.Left Hip Fracture - S/P IM Nail 07/2022   F/u   Lyda Jester PA-C  10/01/2022

## 2022-10-05 ENCOUNTER — Other Ambulatory Visit (HOSPITAL_COMMUNITY): Payer: Self-pay | Admitting: Cardiology

## 2022-10-06 ENCOUNTER — Other Ambulatory Visit: Payer: Medicare PPO

## 2022-10-06 VITALS — BP 98/64 | HR 88 | Temp 97.4°F | Wt 100.0 lb

## 2022-10-06 DIAGNOSIS — Z515 Encounter for palliative care: Secondary | ICD-10-CM

## 2022-10-06 NOTE — Progress Notes (Unsigned)
COMMUNITY PALLIATIVE CARE SW NOTE  PATIENT NAME: IRBY DUMAS DOB: 11/19/1945 MRN: [id number]  PRIMARY CARE PROVIDER: [name], [name]  RESPONSIBLE PARTY:  Acct ID - Guarantor Home Phone Work Phone Relationship Acct Type  [id number] SOLEIL, STOCKFORD732 [phone]  Self P/F     [address], [place], [place] [address]     PLAN OF CARE and INTERVENTIONS:              Encounter: Follow up [place] visits completed with [facility] RN. Patient and caregiver, Basilia Jumbo, present.   ACP: SW addressed ACD with patient and left blank ACD documents in the home for patient and her daughter. SW also introduced five wishes pamphlet to patient as she shared that she does not like thinking about or discussing her death, especially when her family brings up the topic, it makes her uncomfortable/ SW advised patient that five wishes pamphlet can be used as a single guide for her to think and consider EOL measures and approaches. Patient was open to reviewing it and also stated that she will write out a new Will.   In home support: [employer] providing PT, OT, and RN. OT to visit 2/29 and RN 3/1. Patient has private caregiver daily.   Plan: SW to follow up with patient 3/26 '@1pm'$      SOCIAL HX:  Social History   Tobacco Use   Smoking status: Never   Smokeless tobacco: Never  Substance Use Topics   Alcohol use: Never    CODE STATUS: FULL CODE ADVANCED DIRECTIVES: ongoing discussion MOST FORM COMPLETE:  N HOSPICE EDUCATION PROVIDED: N        [place], LCSW

## 2022-10-06 NOTE — Progress Notes (Signed)
PATIENT NAME: OHARA BROSH DOB: 1946/05/08 MRN: [id number]  PRIMARY CARE PROVIDER: [name], [name]  RESPONSIBLE PARTY:  Acct ID - Guarantor Home Phone Work Phone Relationship Acct Type  [id number] MILI, CLAYTOR* [phone]  Self P/F     [address], [place], [place] [address]    Home visit completed with patient, caregiver Okey Dupre and [place], SW.  ACP:  Ongoing discussions.  Remains full code.  Will let daughter make final decisions.  Advance directives left in the home with patient.  HF:  Continues to check weights daily.  Patient reports fatigue, weakness and shortness of breath.  Mostly remaining in her room with private caregiver [place].   Wound:  Right heel wound being addressed by home health RN and also podiatry.  Resources:  Continues with home health PT and nursing.  OT is pending.      CODE STATUS: Full ADVANCED DIRECTIVES: No-forms left in home. MOST FORM: No PPS: 40%   PHYSICAL EXAM:   VITALS: Today's Vitals   10/06/22 1258  BP: 98/64  Pulse: 88  Temp: (!) 97.4 F (36.3 C)  SpO2: 97%  Weight: 100 lb (45.4 kg)    LUNGS: clear to auscultation  CARDIAC: Cor RRR}  EXTREMITIES: - for edema SKIN: Skin color, texture, turgor normal. No rashes or lesions or mobility and turgor normal  NEURO: positive for gait problems and weakness       Truitt Merle, RN

## 2022-10-09 ENCOUNTER — Encounter (HOSPITAL_COMMUNITY): Payer: Self-pay

## 2022-10-09 ENCOUNTER — Telehealth (HOSPITAL_COMMUNITY): Payer: Self-pay | Admitting: Pharmacy Technician

## 2022-10-09 ENCOUNTER — Other Ambulatory Visit (HOSPITAL_COMMUNITY): Payer: Self-pay

## 2022-10-09 ENCOUNTER — Other Ambulatory Visit (HOSPITAL_COMMUNITY): Payer: Self-pay | Admitting: Cardiology

## 2022-10-09 ENCOUNTER — Telehealth (HOSPITAL_COMMUNITY): Payer: Self-pay

## 2022-10-09 MED ORDER — VERQUVO 10 MG PO TABS
1.0000 | ORAL_TABLET | Freq: Every day | ORAL | 3 refills | Status: DC
  Filled 2022-10-09: qty 90, 90d supply, fill #0

## 2022-10-09 NOTE — Telephone Encounter (Signed)
Advanced Heart Failure Patient Advocate Encounter  Prior authorization submitted to [facility] for Verquvo on 10/09/22 [place], CPhT Rx Patient Advocate Phone: [phone]

## 2022-10-09 NOTE — Telephone Encounter (Signed)
Advanced Heart Failure Patient Advocate Encounter  The patient was approved for a [employer] grant that will help cover the cost of Verquvo. Total amount awarded, $10,000. Eligibility, 09/09/22 - 09/09/23.  ID [id number]  BIN [id number]  PCN [id number]  Group [id number]  Called and spoke with the patient. Sent 90 day RX request to Chantel (CMA) to send to [facility]. Added billing information to [facility]. Patient is aware it will arrive in the mail.  Charlann Boxer, CPhT

## 2022-10-09 NOTE — Telephone Encounter (Signed)
Advanced Heart Failure Patient Advocate Encounter  Prior Authorization for [place] has been approved.    PA# [id number] Effective dates: 10/09/22 through 08/10/23  Patients co-pay is $80 (90 days)  Charlann Boxer, CPhT

## 2022-10-12 ENCOUNTER — Other Ambulatory Visit (HOSPITAL_COMMUNITY): Payer: Self-pay

## 2022-10-13 ENCOUNTER — Other Ambulatory Visit: Payer: Self-pay

## 2022-10-13 MED ORDER — OMEPRAZOLE 40 MG PO CPDR: 40.0000 mg | DELAYED_RELEASE_CAPSULE | Freq: Every day | ORAL | 1 refills | Status: DC | PRN

## 2022-10-19 ENCOUNTER — Ambulatory Visit: Payer: Medicare PPO | Admitting: Podiatry

## 2022-10-19 DIAGNOSIS — L84 Corns and callosities: Secondary | ICD-10-CM

## 2022-10-19 DIAGNOSIS — L97412 Non-pressure chronic ulcer of right heel and midfoot with fat layer exposed: Secondary | ICD-10-CM | POA: Diagnosis not present

## 2022-10-19 NOTE — Progress Notes (Signed)
Subjective: Chief Complaint  Patient presents with   heel ulcer    Right heel      Patient states that she is doing well.  She states the drainage is being changed a little bit more frequently with the Santyl.  She does try to elevate her heels and use the offloading boots today, when she sleeps that she reports.  She denies any drainage or pus.  No increase in swelling or redness.  She does not report any fevers or chills.  No new wounds that she notes.  She still has a callus, corn on the right fifth toe which causes discomfort.  No drainage or pus at this area.    Objective: AAO x3, NAD DP/PT pulses palpable bilaterally, CRT less than 3 seconds To the posterior aspect of right heel still a fibrogranular wound base measuring smaller 1 x 1 cm.  The rest of the wound has epithelialization overlying it but the wound itself measures 1 x 1 cm with a fibrogranular wound base.  There is no probing, undermining or tunneling.  There is no surrounding erythema, ascending cellulitis.  There is no fluctuation or crepitation.  There is no malodor. Hyperkeratotic lesion to the mid dorsal lateral aspect of the right fifth toe without any underlying ulceration drainage or signs of infection. No pain with calf compression, swelling, warmth, erythema  Assessment: Chronic heel ulceration right side; hyperkeratotic lesion right fifth toe  Plan: -All treatment options discussed with the patient including all alternatives, risks, complications.  -Wound was cleaned today.  Is able to lightly debride this to any complications.  I do not have samples applied Medihoney today.  Recommended every other day Santyl dressing changes and continue offloading at all times.  Clinically the wound does not be looking better.  If no improvement consider referral to the wound care center. -Debrided the corn on the right fifth toe without any complications or bleeding.  Offloading.  Dispensed new offloading pad. -Monitor for any  clinical signs or symptoms of infection and directed to call the office immediately should any occur or go to the ER.  Return in about 3 weeks (around 11/09/2022).  Vivi Barrack DPM

## 2022-10-19 NOTE — Patient Instructions (Signed)
Continue santyl dressing changes every other day. Keep the heels offloaded with the offloading boot or elevate the heels by putting a pillow behind the legs so the heel does not touch the bed.  Monitor for any signs/symptoms of infection. Call the office immediately if any occur or go directly to the emergency room. Call with any questions/concerns.

## 2022-10-26 ENCOUNTER — Encounter (HOSPITAL_COMMUNITY): Payer: Medicare PPO

## 2022-10-29 ENCOUNTER — Other Ambulatory Visit: Payer: Self-pay

## 2022-10-29 ENCOUNTER — Ambulatory Visit (HOSPITAL_COMMUNITY)
Admission: RE | Admit: 2022-10-29 | Discharge: 2022-10-29 | Disposition: A | Discharge status: Home / Self Care | Payer: Medicare PPO | Source: Ambulatory Visit | Attending: Nurse Practitioner | Admitting: Nurse Practitioner

## 2022-10-29 ENCOUNTER — Ambulatory Visit (HOSPITAL_BASED_OUTPATIENT_CLINIC_OR_DEPARTMENT_OTHER)
Admission: RE | Admit: 2022-10-29 | Discharge: 2022-10-29 | Disposition: A | Discharge status: Home / Self Care | Payer: Medicare PPO | Source: Ambulatory Visit | Attending: Cardiology | Admitting: Cardiology

## 2022-10-29 ENCOUNTER — Encounter (HOSPITAL_COMMUNITY): Payer: Self-pay

## 2022-10-29 ENCOUNTER — Encounter (HOSPITAL_COMMUNITY): Payer: Self-pay | Admitting: Cardiology

## 2022-10-29 ENCOUNTER — Inpatient Hospital Stay (HOSPITAL_COMMUNITY)
Admission: EM | Admit: 2022-10-29 | Discharge: 2022-11-04 | DRG: 291 | Disposition: A | Discharge status: Home Health | Payer: Medicare PPO | Attending: Internal Medicine | Admitting: Internal Medicine

## 2022-10-29 ENCOUNTER — Telehealth: Payer: Self-pay | Admitting: Cardiology

## 2022-10-29 ENCOUNTER — Emergency Department (HOSPITAL_COMMUNITY): Payer: Medicare PPO

## 2022-10-29 VITALS — BP 90/50 | HR 85 | Wt 104.4 lb

## 2022-10-29 DIAGNOSIS — K760 Fatty (change of) liver, not elsewhere classified: Secondary | ICD-10-CM | POA: Diagnosis present

## 2022-10-29 DIAGNOSIS — I130 Hypertensive heart and chronic kidney disease with heart failure and stage 1 through stage 4 chronic kidney disease, or unspecified chronic kidney disease: Secondary | ICD-10-CM | POA: Insufficient documentation

## 2022-10-29 DIAGNOSIS — I4891 Unspecified atrial fibrillation: Secondary | ICD-10-CM | POA: Insufficient documentation

## 2022-10-29 DIAGNOSIS — Z79899 Other long term (current) drug therapy: Secondary | ICD-10-CM | POA: Insufficient documentation

## 2022-10-29 DIAGNOSIS — Z880 Allergy status to penicillin: Secondary | ICD-10-CM

## 2022-10-29 DIAGNOSIS — D649 Anemia, unspecified: Secondary | ICD-10-CM | POA: Insufficient documentation

## 2022-10-29 DIAGNOSIS — I5023 Acute on chronic systolic (congestive) heart failure: Secondary | ICD-10-CM | POA: Diagnosis present

## 2022-10-29 DIAGNOSIS — R142 Eructation: Secondary | ICD-10-CM

## 2022-10-29 DIAGNOSIS — R09A2 Foreign body sensation, throat: Secondary | ICD-10-CM | POA: Diagnosis present

## 2022-10-29 DIAGNOSIS — I480 Paroxysmal atrial fibrillation: Secondary | ICD-10-CM | POA: Diagnosis present

## 2022-10-29 DIAGNOSIS — Z7901 Long term (current) use of anticoagulants: Secondary | ICD-10-CM | POA: Insufficient documentation

## 2022-10-29 DIAGNOSIS — D472 Monoclonal gammopathy: Secondary | ICD-10-CM

## 2022-10-29 DIAGNOSIS — Z8261 Family history of arthritis: Secondary | ICD-10-CM

## 2022-10-29 DIAGNOSIS — Z681 Body mass index (BMI) 19 or less, adult: Secondary | ICD-10-CM

## 2022-10-29 DIAGNOSIS — N184 Chronic kidney disease, stage 4 (severe): Secondary | ICD-10-CM | POA: Insufficient documentation

## 2022-10-29 DIAGNOSIS — M069 Rheumatoid arthritis, unspecified: Secondary | ICD-10-CM | POA: Insufficient documentation

## 2022-10-29 DIAGNOSIS — R9431 Abnormal electrocardiogram [ECG] [EKG]: Secondary | ICD-10-CM | POA: Diagnosis present

## 2022-10-29 DIAGNOSIS — I5022 Chronic systolic (congestive) heart failure: Secondary | ICD-10-CM

## 2022-10-29 DIAGNOSIS — I491 Atrial premature depolarization: Secondary | ICD-10-CM | POA: Insufficient documentation

## 2022-10-29 DIAGNOSIS — S72002D Fracture of unspecified part of neck of left femur, subsequent encounter for closed fracture with routine healing: Secondary | ICD-10-CM

## 2022-10-29 DIAGNOSIS — I5084 End stage heart failure: Secondary | ICD-10-CM | POA: Diagnosis present

## 2022-10-29 DIAGNOSIS — I428 Other cardiomyopathies: Secondary | ICD-10-CM | POA: Insufficient documentation

## 2022-10-29 DIAGNOSIS — R109 Unspecified abdominal pain: Secondary | ICD-10-CM

## 2022-10-29 DIAGNOSIS — Z515 Encounter for palliative care: Secondary | ICD-10-CM

## 2022-10-29 DIAGNOSIS — D7589 Other specified diseases of blood and blood-forming organs: Secondary | ICD-10-CM | POA: Diagnosis present

## 2022-10-29 DIAGNOSIS — E854 Organ-limited amyloidosis: Secondary | ICD-10-CM | POA: Diagnosis not present

## 2022-10-29 DIAGNOSIS — J9811 Atelectasis: Secondary | ICD-10-CM | POA: Diagnosis present

## 2022-10-29 DIAGNOSIS — Z8249 Family history of ischemic heart disease and other diseases of the circulatory system: Secondary | ICD-10-CM | POA: Insufficient documentation

## 2022-10-29 DIAGNOSIS — I43 Cardiomyopathy in diseases classified elsewhere: Secondary | ICD-10-CM | POA: Diagnosis present

## 2022-10-29 DIAGNOSIS — Z803 Family history of malignant neoplasm of breast: Secondary | ICD-10-CM

## 2022-10-29 DIAGNOSIS — I420 Dilated cardiomyopathy: Secondary | ICD-10-CM | POA: Diagnosis present

## 2022-10-29 DIAGNOSIS — Z7952 Long term (current) use of systemic steroids: Secondary | ICD-10-CM

## 2022-10-29 DIAGNOSIS — I493 Ventricular premature depolarization: Secondary | ICD-10-CM | POA: Insufficient documentation

## 2022-10-29 DIAGNOSIS — I083 Combined rheumatic disorders of mitral, aortic and tricuspid valves: Secondary | ICD-10-CM | POA: Insufficient documentation

## 2022-10-29 DIAGNOSIS — W19XXXD Unspecified fall, subsequent encounter: Secondary | ICD-10-CM | POA: Diagnosis present

## 2022-10-29 DIAGNOSIS — I4892 Unspecified atrial flutter: Secondary | ICD-10-CM | POA: Diagnosis present

## 2022-10-29 DIAGNOSIS — N179 Acute kidney failure, unspecified: Secondary | ICD-10-CM | POA: Diagnosis present

## 2022-10-29 DIAGNOSIS — M0579 Rheumatoid arthritis with rheumatoid factor of multiple sites without organ or systems involvement: Secondary | ICD-10-CM | POA: Diagnosis present

## 2022-10-29 DIAGNOSIS — Z833 Family history of diabetes mellitus: Secondary | ICD-10-CM

## 2022-10-29 DIAGNOSIS — K224 Dyskinesia of esophagus: Secondary | ICD-10-CM | POA: Diagnosis present

## 2022-10-29 DIAGNOSIS — K219 Gastro-esophageal reflux disease without esophagitis: Secondary | ICD-10-CM | POA: Diagnosis present

## 2022-10-29 DIAGNOSIS — B0229 Other postherpetic nervous system involvement: Secondary | ICD-10-CM | POA: Diagnosis present

## 2022-10-29 DIAGNOSIS — E8809 Other disorders of plasma-protein metabolism, not elsewhere classified: Secondary | ICD-10-CM | POA: Diagnosis present

## 2022-10-29 DIAGNOSIS — C9000 Multiple myeloma not having achieved remission: Secondary | ICD-10-CM | POA: Diagnosis present

## 2022-10-29 DIAGNOSIS — D72819 Decreased white blood cell count, unspecified: Secondary | ICD-10-CM | POA: Diagnosis present

## 2022-10-29 DIAGNOSIS — Z79624 Long term (current) use of inhibitors of nucleotide synthesis: Secondary | ICD-10-CM

## 2022-10-29 DIAGNOSIS — D631 Anemia in chronic kidney disease: Secondary | ICD-10-CM | POA: Diagnosis present

## 2022-10-29 DIAGNOSIS — Z888 Allergy status to other drugs, medicaments and biological substances status: Secondary | ICD-10-CM

## 2022-10-29 DIAGNOSIS — I081 Rheumatic disorders of both mitral and tricuspid valves: Secondary | ICD-10-CM | POA: Diagnosis present

## 2022-10-29 DIAGNOSIS — E876 Hypokalemia: Secondary | ICD-10-CM | POA: Diagnosis present

## 2022-10-29 DIAGNOSIS — I5082 Biventricular heart failure: Secondary | ICD-10-CM | POA: Diagnosis present

## 2022-10-29 DIAGNOSIS — R54 Age-related physical debility: Secondary | ICD-10-CM | POA: Diagnosis present

## 2022-10-29 DIAGNOSIS — M810 Age-related osteoporosis without current pathological fracture: Secondary | ICD-10-CM | POA: Diagnosis present

## 2022-10-29 LAB — COMPREHENSIVE METABOLIC PANEL
ALT: 18 U/L (ref 0–44)
ALT: 20 U/L (ref 0–44)
AST: 22 U/L (ref 15–41)
AST: 27 U/L (ref 15–41)
Albumin: 2.5 g/dL — ABNORMAL LOW (ref 3.5–5.0)
Albumin: 2.7 g/dL — ABNORMAL LOW (ref 3.5–5.0)
Alkaline Phosphatase: 57 U/L (ref 38–126)
Alkaline Phosphatase: 64 U/L (ref 38–126)
Anion gap: 12 (ref 5–15)
Anion gap: 9 (ref 5–15)
BUN: 30 mg/dL — ABNORMAL HIGH (ref 8–23)
BUN: 30 mg/dL — ABNORMAL HIGH (ref 8–23)
CO2: 28 mmol/L (ref 22–32)
CO2: 29 mmol/L (ref 22–32)
Calcium: 8.6 mg/dL — ABNORMAL LOW (ref 8.9–10.3)
Calcium: 9 mg/dL (ref 8.9–10.3)
Chloride: 98 mmol/L (ref 98–111)
Chloride: 101 mmol/L (ref 98–111)
Creatinine, Ser: 2.79 mg/dL — ABNORMAL HIGH (ref 0.44–1.00)
Creatinine, Ser: 2.83 mg/dL — ABNORMAL HIGH (ref 0.44–1.00)
GFR, Estimated: 17 mL/min — ABNORMAL LOW (ref >60)
GFR, Estimated: 17 mL/min — ABNORMAL LOW (ref >60)
Glucose, Bld: 100 mg/dL — ABNORMAL HIGH (ref 70–99)
Glucose, Bld: 95 mg/dL (ref 70–99)
Potassium: 3.1 mmol/L — ABNORMAL LOW (ref 3.5–5.1)
Potassium: 3.3 mmol/L — ABNORMAL LOW (ref 3.5–5.1)
Sodium: 138 mmol/L (ref 135–145)
Sodium: 139 mmol/L (ref 135–145)
Total Bilirubin: 0.7 mg/dL (ref 0.3–1.2)
Total Bilirubin: 0.7 mg/dL (ref 0.3–1.2)
Total Protein: 5.7 g/dL — ABNORMAL LOW (ref 6.5–8.1)
Total Protein: 6 g/dL — ABNORMAL LOW (ref 6.5–8.1)

## 2022-10-29 LAB — CBC WITH DIFFERENTIAL/PLATELET
Abs Immature Granulocytes: 0.03 10*3/uL (ref 0.00–0.07)
Basophils Absolute: 0 10*3/uL (ref 0.0–0.1)
Basophils Relative: 1 %
Eosinophils Absolute: 0.1 10*3/uL (ref 0.0–0.5)
Eosinophils Relative: 3 %
HCT: 24.5 % — ABNORMAL LOW (ref 36.0–46.0)
Hemoglobin: 7.6 g/dL — ABNORMAL LOW (ref 12.0–15.0)
Immature Granulocytes: 1 %
Lymphocytes Relative: 26 %
Lymphs Abs: 0.8 10*3/uL (ref 0.7–4.0)
MCH: 31.3 pg (ref 26.0–34.0)
MCHC: 31 g/dL (ref 30.0–36.0)
MCV: 100.8 fL — ABNORMAL HIGH (ref 80.0–100.0)
Monocytes Absolute: 0.1 10*3/uL (ref 0.1–1.0)
Monocytes Relative: 2 %
Neutro Abs: 2.1 10*3/uL (ref 1.7–7.7)
Neutrophils Relative %: 67 %
Platelets: 219 10*3/uL (ref 150–400)
RBC: 2.43 MIL/uL — ABNORMAL LOW (ref 3.87–5.11)
RDW: 20 % — ABNORMAL HIGH (ref 11.5–15.5)
WBC: 3.2 10*3/uL — ABNORMAL LOW (ref 4.0–10.5)
nRBC: 0 % (ref 0.0–0.2)

## 2022-10-29 LAB — TSH: TSH: 2.239 u[IU]/mL (ref 0.350–4.500)

## 2022-10-29 LAB — PREPARE RBC (CROSSMATCH)

## 2022-10-29 LAB — ECHOCARDIOGRAM COMPLETE
Area-P 1/2: 6.71 cm2
Calc EF: 24.5 %
MV M vel: 4.59 m/s
MV Peak grad: 84.3 mmHg
P 1/2 time: 332 msec
Radius: 0.3 cm
S' Lateral: 5.4 cm
Single Plane A2C EF: 24.7 %
Single Plane A4C EF: 28 %

## 2022-10-29 LAB — PROTIME-INR
INR: 1.1 (ref 0.8–1.2)
Prothrombin Time: 14.5 seconds (ref 11.4–15.2)

## 2022-10-29 LAB — CBC
HCT: 22.3 % — ABNORMAL LOW (ref 36.0–46.0)
Hemoglobin: 6.9 g/dL — CL (ref 12.0–15.0)
MCH: 31.1 pg (ref 26.0–34.0)
MCHC: 30.9 g/dL (ref 30.0–36.0)
MCV: 100.5 fL — ABNORMAL HIGH (ref 80.0–100.0)
Platelets: 197 10*3/uL (ref 150–400)
RBC: 2.22 MIL/uL — ABNORMAL LOW (ref 3.87–5.11)
RDW: 19.9 % — ABNORMAL HIGH (ref 11.5–15.5)
WBC: 3 10*3/uL — ABNORMAL LOW (ref 4.0–10.5)
nRBC: 0 % (ref 0.0–0.2)

## 2022-10-29 MED ORDER — FUROSEMIDE 10 MG/ML IJ SOLN
40.0000 mg | Freq: Once | INTRAMUSCULAR | Status: AC
  Administered 2022-10-30: 40 mg via INTRAVENOUS
  Filled 2022-10-29: qty 4

## 2022-10-29 MED ORDER — SODIUM CHLORIDE 0.9% IV SOLUTION: Freq: Once | INTRAVENOUS | Status: AC

## 2022-10-29 NOTE — ED Provider Notes (Signed)
[facility] Provider Note  CSN: [id number] Arrival date & time: 10/29/22 [id number]  Chief Complaint(s) Abnormal Labs  HPI Tracy Nelson is a 77 y.o. female with a past medical history listed below including paroxysmal A-fib on Eliquis, chronic systolic heart failure with a last EF 20-25% today, CKD, anemia of chronic disease who presents to the emergency department for low hemoglobins.  Patient was at cardiology clinic and had screening labs obtained showing a hemoglobin of 6.9.  Patient states that she has been feeling generally fatigued with dyspnea on exertion over the past week.  There is no peripheral edema.  No chest pain.  No coughing or congestion.  No shortness of breath at rest.  Patient denies any melena or hematochezia.  The history is provided by the patient.    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   HFrEF (heart failure with reduced ejection fraction) ([place]) 07/15/2020   HTN (hypertension) 07/15/2020   Osteoporosis 07/15/2020   Palpitations 07/15/2020   Rheumatoid arthritis ([place]) 07/15/2020   Patient Active Problem List   Diagnosis Date Noted   Acute on chronic anemia 10/30/2022   Hypokalemia 10/30/2022   Cataract 09/14/2022   Neoplasm of uncertain behavior of skin 09/14/2022   Uterine prolapse 09/14/2022   Congestive heart failure ([place]) 09/03/2022   Pressure ulcer 09/03/2022   Acute on chronic HFrEF (heart failure with reduced ejection fraction) ([place]) 08/30/2022   Elevated d-dimer 08/30/2022   Anemia of chronic disease 08/30/2022   Paroxysmal atrial fibrillation ([place]) [id number]   Chronic systolic CHF (congestive heart failure) ([place]) 07/13/2022   CKD (chronic kidney disease), 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   Displaced intertrochanteric fracture of left femur,  initial encounter for closed fracture ([place]) 07/08/2022   Corneal melt, 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   Monoclonal gammopathy 03/10/2021   Hypotension 10/22/2020   Atypical chest pain 10/22/2020   CKD (chronic kidney disease), stage III ([place]) 10/22/2020   Nausea & vomiting 10/22/2020   Physical deconditioning 10/22/2020   Atrial flutter ([place]) 10/18/2020   Arthritis 07/15/2020   GERD (gastroesophageal reflux disease) 07/15/2020   Colitis 07/15/2020   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   HFrEF (heart failure with reduced ejection fraction) ([place]) 07/15/2020   HTN (hypertension) 07/15/2020   Osteoporosis 07/15/2020   Palpitations 07/15/2020   Acute gout of left shoulder 07/10/2020   Raynaud's disease 05/05/2019   Rheumatoid arthritis involving multiple sites with positive rheumatoid factor ([place]) 10/28/2015   Mitral regurgitation 05/30/2014   Acute on chronic combined systolic (congestive) and diastolic (congestive) heart failure ([place]) 05/19/2014   Vitamin D deficiency 05/15/2013   Encounter for long-term (current) use of other medications 12/14/2008   Sjogren's syndrome ([place]) 12/14/2008   Home Medication(s) Prior to Admission medications   Medication Sig Start Date End Date Taking? Authorizing Provider  amiodarone (PACERONE) 200 MG tablet TAKE 1 TABLET BY MOUTH EVERY DAY 12/19/21   [name], MD  azaTHIOprine (IMURAN) 50 MG tablet Take 2 tablets (100 mg total) by mouth daily. 09/29/22   Rice, Resa Miner, MD  bumetanide (BUMEX) 1 MG tablet Take 1 tablet (1 mg total) by mouth daily. 09/12/22   Domenic Polite, MD  calcitRIOL (ROCALTROL) 0.25 MCG capsule Take 0.25 mcg by mouth daily. 06/25/22   [provider]  collagenase (SANTYL) 250 UNIT/GM ointment Apply  1 Application topically daily. 09/28/22   Trula Slade, DPM  ELIQUIS 2.5 MG TABS tablet TAKE 1 TABLET BY MOUTH TWICE A DAY 10/05/22   [name], MD  Ensure (ENSURE) Take 237 mLs by mouth daily as needed (for  meal supplement).    [provider]  folic acid (FOLVITE) 1 MG tablet TAKE 1 TABLET BY MOUTH EVERY DAY 07/22/22   [name], MD  hydroxypropyl methylcellulose / hypromellose (ISOPTO TEARS / GONIOVISC) 2.5 % ophthalmic solution Place 1 drop into both eyes as needed for dry eyes.    [provider]  Menthol, Topical Analgesic, (ICY HOT BACK EX) Apply 1 application topically daily as needed (pain).    [provider]  Multiple Vitamin (MULTIVITAMIN) tablet Take 1 tablet by mouth daily.    [provider]  omeprazole (PRILOSEC) 40 MG capsule Take 1 capsule (40 mg total) by mouth daily as needed (for acid reflux). 10/13/22   [name], [name]  polyethylene glycol (MIRALAX / GLYCOLAX) 17 g packet Take 17 g by mouth daily as needed for mild constipation.    [provider]  predniSONE (DELTASONE) 5 MG tablet Take 1 tablet (5 mg total) by mouth daily with breakfast. 09/29/22   Rice, Resa Miner, MD  Vericiguat (VERQUVO) 10 MG TABS Take 1 tablet (10 mg total) by mouth daily. 10/09/22   [name], MD                                                                                                                                    Allergies Baclofen, Penicillin g, and Penicillins  Review of Systems Review of Systems As noted in HPI  Physical Exam Vital Signs  I have reviewed the triage vital signs BP 94/73 (BP Location: Right Arm)   Pulse 90   Temp 98.4 F (36.9 C) (Oral)   Resp 20   SpO2 97%   Physical Exam Vitals reviewed.  Constitutional:      General: She is not in acute distress.    Appearance: She is well-developed. She is not diaphoretic.  HENT:     Head: Normocephalic and atraumatic.     Right Ear: External ear normal.     Left Ear: External ear normal.     Nose: Nose normal.  Eyes:     General: No scleral icterus.    Conjunctiva/sclera: Conjunctivae normal.  Neck:     Trachea: Phonation normal.  Cardiovascular:     Rate  and Rhythm: Normal rate and regular rhythm.  Pulmonary:     Effort: Pulmonary effort is normal. No respiratory distress.     Breath sounds: No stridor.  Abdominal:     General: There is no distension.  Musculoskeletal:        General: Normal range of motion.     Cervical back: Normal range of motion.     Right lower leg: No edema.  Left lower leg: No edema.  Neurological:     Mental Status: She is alert and oriented to person, place, and time.  Psychiatric:        Behavior: Behavior normal.     ED Results and Treatments Labs (all labs ordered are listed, but only abnormal results are displayed) Labs Reviewed  COMPREHENSIVE METABOLIC PANEL - Abnormal; Notable for the following components:      Result Value   Potassium 3.3 (*)    BUN 30 (*)    Creatinine, Ser 2.83 (*)    Total Protein 6.0 (*)    Albumin 2.7 (*)    GFR, Estimated 17 (*)    All other components within normal limits  CBC WITH DIFFERENTIAL/PLATELET - Abnormal; Notable for the following components:   WBC 3.2 (*)    RBC 2.43 (*)    Hemoglobin 7.6 (*)    HCT 24.5 (*)    MCV 100.8 (*)    RDW 20.0 (*)    All other components within normal limits  RETICULOCYTES - Abnormal; Notable for the following components:   RBC. 2.19 (*)    Retic Count, Absolute 14.9 (*)    All other components within normal limits  PROTIME-INR  URINALYSIS, ROUTINE W REFLEX MICROSCOPIC  BRAIN NATRIURETIC PEPTIDE  VITAMIN B12  FOLATE  IRON AND TIBC  FERRITIN  POC OCCULT BLOOD, ED  TYPE AND SCREEN  PREPARE RBC (CROSSMATCH)                                                                                                                         EKG  EKG Interpretation  Date/Time:  Friday October 30 2022 01:03:23 EDT Ventricular Rate:  87 PR Interval:  134 QRS Duration: 113 QT Interval:  418 QTC Calculation: 503 R Axis:   -15 Text Interpretation: Sinus rhythm Atrial premature complexes Anterior infarct, old Nonspecific T  abnormalities, lateral leads Prolonged QT interval Confirmed by Addison Lank [phone]) on 10/30/2022 1:05:04 AM       Radiology DG Chest Port 1 View  Result Date: 10/29/2022 CLINICAL DATA:  Dyspnea on exertion. EXAM: PORTABLE CHEST 1 VIEW COMPARISON:  Chest radiograph dated September 07, 2018. FINDINGS: The heart is enlarged. Aorta is tortuous. Left basilar opacity with atelectasis and/or small effusion. Right lung is clear. Thoracic spondylosis. IMPRESSION: 1. Left basilar opacity with atelectasis and/or small effusion. 2. Cardiomegaly. Electronically Signed   By: Keane Police D.O.   On: 10/29/2022 23:37   ECHOCARDIOGRAM COMPLETE  Result Date: 10/29/2022    ECHOCARDIOGRAM REPORT   Patient Name:   Tracy Nelson Date of Exam: 10/29/2022 Medical Rec #:  [id number]         Height:       64.0 in Accession #:    [id number]        Weight:       100.0 lb Date of Birth:  1946/06/22          BSA:  1.457 m Patient Age:    46 years          BP:           98/66 mmHg Patient Gender: F                 HR:           90 bpm. Exam Location:  Inpatient Procedure: 2D Echo, Cardiac Doppler and Color Doppler Indications:    [id number] Chronic systolic (congestive) heart failure  History:        Patient has prior history of Echocardiogram examinations.                 Cardiomyopathy; Risk Factors:Hypertension.  Sonographer:    Phineas Douglas Referring Phys: [phone] AMY D CLEGG IMPRESSIONS  1. Left ventricular ejection fraction, by estimation, is 20 to 25%. The left ventricle has severely decreased function. The left ventricle demonstrates global hypokinesis. The left ventricular internal cavity size was mildly dilated. Left ventricular diastolic function could not be evaluated. Elevated left ventricular end-diastolic pressure.  2. Right ventricular systolic function is severely reduced. The right ventricular size is mildly enlarged. There is normal pulmonary artery systolic pressure.  3. Left atrial size was severely dilated.   4. Right atrial size was severely dilated.  5. The mitral valve is degenerative. Moderate mitral valve regurgitation. No evidence of mitral stenosis.  6. Tricuspid valve regurgitation is moderate to severe.  7. The aortic valve is calcified. Aortic valve regurgitation is mild to moderate. Aortic valve sclerosis/calcification is present, without any evidence of aortic stenosis.  8. Abdominal aorta is normal size.  9. The inferior vena cava is normal in size with greater than 50% respiratory variability, suggesting right atrial pressure of 3 mmHg. Comparison(s): A prior study was performed on 08/2022. No significant change from prior study. FINDINGS  Left Ventricle: Left ventricular ejection fraction, by estimation, is 20 to 25%. The left ventricle has severely decreased function. The left ventricle demonstrates global hypokinesis. The left ventricular internal cavity size was mildly dilated. There is no left ventricular hypertrophy. Left ventricular diastolic function could not be evaluated due to mitral regurgitation (moderate or greater). Left ventricular diastolic function could not be evaluated. Elevated left ventricular end-diastolic pressure. Right Ventricle: The right ventricular size is mildly enlarged. No increase in right ventricular wall thickness. Right ventricular systolic function is severely reduced. There is normal pulmonary artery systolic pressure. The tricuspid regurgitant velocity is 2.82 m/s, and with an assumed right atrial pressure of 3 mmHg, the estimated right ventricular systolic pressure is [id number] mmHg. Left Atrium: Left atrial size was severely dilated. Right Atrium: Right atrial size was severely dilated. Pericardium: There is no evidence of pericardial effusion. Mitral Valve: The mitral valve is degenerative in appearance. Moderate mitral valve regurgitation. No evidence of mitral valve stenosis. Tricuspid Valve: The tricuspid valve is normal in structure. Tricuspid valve regurgitation is  moderate to severe. No evidence of tricuspid stenosis. Aortic Valve: The aortic valve is calcified. Aortic valve regurgitation is mild to moderate. Aortic regurgitation PHT measures 332 msec. Aortic valve sclerosis/calcification is present, without any evidence of aortic stenosis. Pulmonic Valve: The pulmonic valve was not well visualized. Pulmonic valve regurgitation is mild. No evidence of pulmonic stenosis. Aorta: Abdominal aorta is normal size. The aortic root is normal in size and structure. Venous: The inferior vena cava is normal in size with greater than 50% respiratory variability, suggesting right atrial pressure of 3 mmHg. IAS/Shunts: No atrial level shunt detected by  color flow Doppler.  LEFT VENTRICLE PLAX 2D LVIDd:         5.90 cm      Diastology LVIDs:         5.40 cm      LV e' medial:    4.35 cm/s LV PW:         1.00 cm      LV E/e' medial:  20.2 LV IVS:        0.80 cm      LV e' lateral:   7.72 cm/s LVOT diam:     2.00 cm      LV E/e' lateral: 11.4 LV SV:         56 LV SV Index:   39 LVOT Area:     3.14 cm  LV Volumes (MOD) LV vol d, MOD A2C: 190.0 ml LV vol d, MOD A4C: 161.0 ml LV vol s, MOD A2C: 143.0 ml LV vol s, MOD A4C: 116.0 ml LV SV MOD A2C:     47.0 ml LV SV MOD A4C:     161.0 ml LV SV MOD BP:      43.5 ml RIGHT VENTRICLE             IVC RV Basal diam:  4.70 cm     IVC diam: 2.00 cm RV S prime:     13.30 cm/s TAPSE (M-mode): 2.5 cm LEFT ATRIUM             Index        RIGHT ATRIUM           Index LA diam:        2.60 cm 1.78 cm/m   RA Area:     24.00 cm LA Vol (A2C):   87.3 ml 59.91 ml/m  RA Volume:   87.10 ml  59.77 ml/m LA Vol (A4C):   62.5 ml 42.89 ml/m LA Biplane Vol: 75.6 ml 51.88 ml/m  AORTIC VALVE LVOT Vmax:   107.00 cm/s LVOT Vmean:  68.200 cm/s LVOT VTI:    0.179 m AI PHT:      332 msec  AORTA Ao Root diam: 2.90 cm Ao Asc diam:  3.10 cm MITRAL VALVE                  TRICUSPID VALVE MV Area (PHT): 6.71 cm       TR Peak grad:   31.8 mmHg MV Decel Time: 113 msec       TR Vmax:         282.00 cm/s MR Peak grad:    84.3 mmHg MR Mean grad:    45.0 mmHg    SHUNTS MR Vmax:         459.00 cm/s  Systemic VTI:  0.18 m MR Vmean:        316.0 cm/s   Systemic Diam: 2.00 cm MR PISA:         0.57 cm MR PISA Eff ROA: 4 mm MR PISA Radius:  0.30 cm MV E velocity: 87.90 cm/s MV A velocity: 160.00 cm/s MV E/A ratio:  0.55 Kardie Tobb DO Electronically signed by Berniece Salines DO Signature Date/Time: 10/29/2022/4:04:54 PM    Final     Medications Ordered in ED Medications  0.9 %  sodium chloride infusion (Manually program via Guardrails IV Fluids) ( Intravenous New Bag/Given 10/30/22 0051)  furosemide (LASIX) injection 40 mg (40 mg Intravenous Given 10/30/22 0055)  Procedures .1-3 Lead EKG Interpretation  Performed by: Fatima Blank, MD Authorized by: Fatima Blank, MD     Interpretation: normal     ECG rate:  89   ECG rate assessment: normal     Rhythm: sinus rhythm     Ectopy: none     Conduction: normal   .Critical Care  Performed by: Fatima Blank, MD Authorized by: Fatima Blank, MD   Critical care provider statement:    Critical care time (minutes):  45   Critical care time was exclusive of:  Separately billable procedures and treating other patients   Critical care was necessary to treat or prevent imminent or life-threatening deterioration of the following conditions:  Circulatory failure   Critical care was time spent personally by me on the following activities:  Development of treatment plan with patient or surrogate, discussions with consultants, evaluation of patient's response to treatment, examination of patient, obtaining history from patient or surrogate, review of old charts, re-evaluation of patient's condition, pulse oximetry, ordering and review of radiographic studies, ordering and review of  laboratory studies and ordering and performing treatments and interventions   Care discussed with: admitting provider     (including critical care time)  Medical Decision Making / ED Course  Click here for ABCD2, HEART and other calculators  Medical Decision Making Amount and/or Complexity of Data Reviewed Labs: ordered. Decision-making details documented in ED Course. Radiology: ordered and independent interpretation performed. Decision-making details documented in ED Course. ECG/medicine tests: ordered and independent interpretation performed. Decision-making details documented in ED Course.  Risk Prescription drug management. Decision regarding hospitalization.    Patient presents with hemoglobin of 6.9 during clinic visit.  She is symptomatic with generalized fatigue and dyspnea on exertion.  Will need to assess for heart failure as well.  Repeat labs here notable for hemoglobin of 7.6, which is still a 1 g drop from 1 month ago. Metabolic panel with mild hypokalemia.  Stable renal function. Will get anemia panel, Hemoccult.  Chest x-ray without evidence of pulmonary edema.  Patient will require blood transfusion.  Given her EF of 20-25% today, transfusions will need to be given slowly.  Will discuss case with hospitalist service regarding admission for close monitoring while transfusing.        Final Clinical Impression(s) / ED Diagnoses Final diagnoses:  Symptomatic anemia           This chart was dictated using voice recognition software.  Despite best efforts to proofread,  errors can occur which can change the documentation meaning.    Fatima Blank, MD 10/30/22 [phone]

## 2022-10-29 NOTE — ED Provider Triage Note (Signed)
Emergency Medicine Provider Triage Evaluation Note  Tracy Nelson , a 77 y.o. female  was evaluated in triage.  Pt complains of a low hemoglobin level.  Patient states that she was evaluated by cardiology earlier today.  Her hemoglobin was found to be 6.9.  Patient endorses weakness, fatigue.  She denies chest pain, shortness of breath at this time.  She denies dark stools, rectal bleeds.  She does endorse episodes of vomiting over the past week.  Review of Systems  Positive: As above Negative: As above  Physical Exam  BP 104/72   Pulse 94   Temp 98.9 F (37.2 C) (Oral)   Resp 18   SpO2 100%  Gen:   Awake, no distress   Resp:  Normal effort  MSK:   Moves extremities without difficulty  Other:    Medical Decision Making  Medically screening exam initiated at 7:29 PM.  Appropriate orders placed.  Tracy Nelson was informed that the remainder of the evaluation will be completed by another provider, this initial triage assessment does not replace that evaluation, and the importance of remaining in the ED until their evaluation is complete.     Dorothyann Peng, PA-C 10/29/22 1930

## 2022-10-29 NOTE — Progress Notes (Signed)
Echocardiogram 2D Echocardiogram has been performed.  Tracy Nelson 10/29/2022, 3:50 PM

## 2022-10-29 NOTE — Patient Instructions (Signed)
There has been no changes to your medications  Labs done today, your results will be available in MyChart, we will contact you for abnormal readings.  Your physician recommends that you schedule a follow-up appointment in: 6 weeks  If you have any questions or concerns before your next appointment please send [place] a message through [place] or call our office at [phone].    TO LEAVE A MESSAGE FOR THE NURSE SELECT OPTION 2, PLEASE LEAVE A MESSAGE INCLUDING: YOUR NAME DATE OF BIRTH CALL BACK NUMBER REASON FOR CALL**this is important as we prioritize the call backs  YOU WILL RECEIVE A CALL BACK THE SAME DAY AS LONG AS YOU CALL BEFORE 4:00 PM  At the [facility], you and your health needs are our priority. As part of our continuing mission to provide you with exceptional heart care, we have created designated Provider Care Teams. These Care Teams include your primary Cardiologist (physician) and Advanced Practice Providers (APPs- Physician Assistants and Nurse Practitioners) who all work together to provide you with the care you need, when you need it.   You may see any of the following providers on your designated Care Team at your next follow up: Dr Glori Bickers Dr Loralie Champagne Dr. Roxana Hires, NP Lyda Jester, [place] [facility], [place] Forestine Na, NP Audry Riles, PharmD   Please be sure to bring in all your medications bottles to every appointment.    Thank you for choosing [facility]

## 2022-10-29 NOTE — Telephone Encounter (Signed)
Notified by the [facility] that patient's hemoglobin was 6.9. Patient is on eliquis 2.5 mg BID at home.   I called patient and notified her of her low hemoglobin. Discussed that 6.9 is dangerously low, and it is even more concerning since she is on a blood thinner. I instructed her to come to the emergency department for further evaluation and possible blood transfusion. Also instructed patient that she should not drive herself, and patient told me that she has someone who will drive her to the ED. Patient is in agreement and plans to come to the [facility] ED.   Margie Billet, PA-C 10/29/2022 6:11 PM

## 2022-10-29 NOTE — ED Triage Notes (Signed)
Went for outpatient ECHO today with cardiology. Baseline labs showed HGB of 6.9.

## 2022-10-30 ENCOUNTER — Telehealth (HOSPITAL_COMMUNITY): Payer: Self-pay

## 2022-10-30 ENCOUNTER — Observation Stay (HOSPITAL_COMMUNITY): Payer: Medicare PPO

## 2022-10-30 ENCOUNTER — Encounter (HOSPITAL_COMMUNITY): Payer: Self-pay | Admitting: Family Medicine

## 2022-10-30 DIAGNOSIS — I5023 Acute on chronic systolic (congestive) heart failure: Secondary | ICD-10-CM | POA: Diagnosis not present

## 2022-10-30 DIAGNOSIS — N184 Chronic kidney disease, stage 4 (severe): Secondary | ICD-10-CM | POA: Diagnosis not present

## 2022-10-30 DIAGNOSIS — Z515 Encounter for palliative care: Secondary | ICD-10-CM | POA: Diagnosis not present

## 2022-10-30 DIAGNOSIS — D649 Anemia, unspecified: Secondary | ICD-10-CM

## 2022-10-30 DIAGNOSIS — M0579 Rheumatoid arthritis with rheumatoid factor of multiple sites without organ or systems involvement: Secondary | ICD-10-CM | POA: Diagnosis not present

## 2022-10-30 DIAGNOSIS — I5022 Chronic systolic (congestive) heart failure: Secondary | ICD-10-CM | POA: Diagnosis not present

## 2022-10-30 DIAGNOSIS — R9431 Abnormal electrocardiogram [ECG] [EKG]: Secondary | ICD-10-CM

## 2022-10-30 DIAGNOSIS — E854 Organ-limited amyloidosis: Secondary | ICD-10-CM | POA: Diagnosis not present

## 2022-10-30 DIAGNOSIS — R1310 Dysphagia, unspecified: Secondary | ICD-10-CM | POA: Diagnosis not present

## 2022-10-30 DIAGNOSIS — I480 Paroxysmal atrial fibrillation: Secondary | ICD-10-CM

## 2022-10-30 DIAGNOSIS — M069 Rheumatoid arthritis, unspecified: Secondary | ICD-10-CM

## 2022-10-30 DIAGNOSIS — D500 Iron deficiency anemia secondary to blood loss (chronic): Secondary | ICD-10-CM

## 2022-10-30 DIAGNOSIS — I130 Hypertensive heart and chronic kidney disease with heart failure and stage 1 through stage 4 chronic kidney disease, or unspecified chronic kidney disease: Secondary | ICD-10-CM | POA: Diagnosis not present

## 2022-10-30 DIAGNOSIS — K219 Gastro-esophageal reflux disease without esophagitis: Secondary | ICD-10-CM

## 2022-10-30 DIAGNOSIS — Z7901 Long term (current) use of anticoagulants: Secondary | ICD-10-CM

## 2022-10-30 DIAGNOSIS — E876 Hypokalemia: Secondary | ICD-10-CM | POA: Diagnosis present

## 2022-10-30 LAB — BASIC METABOLIC PANEL
Anion gap: 12 (ref 5–15)
BUN: 35 mg/dL — ABNORMAL HIGH (ref 8–23)
CO2: 29 mmol/L (ref 22–32)
Calcium: 8.7 mg/dL — ABNORMAL LOW (ref 8.9–10.3)
Chloride: 101 mmol/L (ref 98–111)
Creatinine, Ser: 2.82 mg/dL — ABNORMAL HIGH (ref 0.44–1.00)
GFR, Estimated: 17 mL/min — ABNORMAL LOW (ref >60)
Glucose, Bld: 85 mg/dL (ref 70–99)
Potassium: 3.6 mmol/L (ref 3.5–5.1)
Sodium: 142 mmol/L (ref 135–145)

## 2022-10-30 LAB — IRON AND TIBC
Iron: 48 ug/dL (ref 28–170)
Saturation Ratios: 25 % (ref 10.4–31.8)
TIBC: 192 ug/dL — ABNORMAL LOW (ref 250–450)
UIBC: 144 ug/dL

## 2022-10-30 LAB — URINALYSIS, ROUTINE W REFLEX MICROSCOPIC
Bilirubin Urine: NEGATIVE
Glucose, UA: NEGATIVE mg/dL
Hgb urine dipstick: NEGATIVE
Ketones, ur: NEGATIVE mg/dL
Leukocytes,Ua: NEGATIVE
Nitrite: NEGATIVE
Protein, ur: NEGATIVE mg/dL
Specific Gravity, Urine: 1.01 (ref 1.005–1.030)
pH: 6 (ref 5.0–8.0)

## 2022-10-30 LAB — MAGNESIUM: Magnesium: 2 mg/dL (ref 1.7–2.4)

## 2022-10-30 LAB — HEMOGLOBIN
Hemoglobin: 10.5 g/dL — ABNORMAL LOW (ref 12.0–15.0)
Hemoglobin: 10.7 g/dL — ABNORMAL LOW (ref 12.0–15.0)

## 2022-10-30 LAB — RETICULOCYTES
Immature Retic Fract: 11.9 % (ref 2.3–15.9)
RBC.: 2.19 MIL/uL — ABNORMAL LOW (ref 3.87–5.11)
Retic Count, Absolute: 14.9 10*3/uL — ABNORMAL LOW (ref 19.0–186.0)
Retic Ct Pct: 0.7 % (ref 0.4–3.1)

## 2022-10-30 LAB — FOLATE: Folate: 38.5 ng/mL (ref >5.9)

## 2022-10-30 LAB — VITAMIN B12: Vitamin B-12: 332 pg/mL (ref 180–914)

## 2022-10-30 LAB — HEMATOCRIT
HCT: 31.8 % — ABNORMAL LOW (ref 36.0–46.0)
HCT: 31.5 % — ABNORMAL LOW (ref 36.0–46.0)

## 2022-10-30 LAB — BRAIN NATRIURETIC PEPTIDE: B Natriuretic Peptide: 1500.5 pg/mL — ABNORMAL HIGH (ref 0.0–100.0)

## 2022-10-30 LAB — FERRITIN: Ferritin: 1322 ng/mL — ABNORMAL HIGH (ref 11–307)

## 2022-10-30 MED ORDER — PANTOPRAZOLE SODIUM 40 MG IV SOLR
40.0000 mg | Freq: Once | INTRAVENOUS | Status: AC
  Administered 2022-10-30: 40 mg via INTRAVENOUS
  Filled 2022-10-30: qty 10

## 2022-10-30 MED ORDER — BUMETANIDE 1 MG PO TABS
1.0000 mg | ORAL_TABLET | Freq: Every day | ORAL | Status: DC
  Administered 2022-10-30 – 2022-11-01 (×3): 1 mg via ORAL
  Filled 2022-10-30 (×4): qty 1

## 2022-10-30 MED ORDER — SODIUM CHLORIDE 0.9 % IV SOLN: 250.0000 mL | INTRAVENOUS | Status: DC | PRN

## 2022-10-30 MED ORDER — ACETAMINOPHEN 325 MG PO TABS: 650.0000 mg | ORAL_TABLET | Freq: Four times a day (QID) | ORAL | Status: DC | PRN

## 2022-10-30 MED ORDER — SODIUM CHLORIDE 0.9% IV SOLUTION: Freq: Once | INTRAVENOUS | Status: DC

## 2022-10-30 MED ORDER — SODIUM BICARBONATE 650 MG PO TABS
650.0000 mg | ORAL_TABLET | Freq: Every day | ORAL | Status: DC
  Administered 2022-10-30 – 2022-11-04 (×6): 650 mg via ORAL
  Filled 2022-10-30 (×6): qty 1

## 2022-10-30 MED ORDER — FOLIC ACID 1 MG PO TABS
1.0000 mg | ORAL_TABLET | Freq: Every day | ORAL | Status: DC
  Administered 2022-10-30 – 2022-11-04 (×6): 1 mg via ORAL
  Filled 2022-10-30 (×6): qty 1

## 2022-10-30 MED ORDER — CALCITRIOL 0.25 MCG PO CAPS
0.2500 ug | ORAL_CAPSULE | Freq: Every day | ORAL | Status: DC
  Administered 2022-10-30 – 2022-11-04 (×6): 0.25 ug via ORAL
  Filled 2022-10-30 (×7): qty 1

## 2022-10-30 MED ORDER — ADULT MULTIVITAMIN W/MINERALS CH
1.0000 | ORAL_TABLET | Freq: Every day | ORAL | Status: DC
  Administered 2022-10-30 – 2022-11-04 (×6): 1 via ORAL
  Filled 2022-10-30 (×6): qty 1

## 2022-10-30 MED ORDER — SODIUM CHLORIDE 0.9% FLUSH
3.0000 mL | Freq: Two times a day (BID) | INTRAVENOUS | Status: DC
  Administered 2022-10-30 – 2022-11-03 (×10): 3 mL via INTRAVENOUS

## 2022-10-30 MED ORDER — POTASSIUM CHLORIDE CRYS ER 10 MEQ PO TBCR
10.0000 meq | EXTENDED_RELEASE_TABLET | Freq: Once | ORAL | Status: AC
  Administered 2022-10-30: 10 meq via ORAL
  Filled 2022-10-30: qty 1

## 2022-10-30 MED ORDER — AMIODARONE HCL 200 MG PO TABS: 200.0000 mg | ORAL_TABLET | Freq: Every day | ORAL | Status: DC

## 2022-10-30 MED ORDER — AMIODARONE HCL 200 MG PO TABS
200.0000 mg | ORAL_TABLET | Freq: Every day | ORAL | Status: DC
  Administered 2022-10-30 – 2022-11-04 (×6): 200 mg via ORAL
  Filled 2022-10-30 (×6): qty 1

## 2022-10-30 MED ORDER — SODIUM CHLORIDE 0.9% FLUSH: 3.0000 mL | INTRAVENOUS | Status: DC | PRN

## 2022-10-30 MED ORDER — PANTOPRAZOLE SODIUM 40 MG IV SOLR
40.0000 mg | Freq: Two times a day (BID) | INTRAVENOUS | Status: DC
  Administered 2022-10-30 – 2022-11-04 (×10): 40 mg via INTRAVENOUS
  Filled 2022-10-30 (×10): qty 10

## 2022-10-30 MED ORDER — AZATHIOPRINE 50 MG PO TABS: 100.0000 mg | ORAL_TABLET | Freq: Every day | ORAL | Status: DC

## 2022-10-30 MED ORDER — PANTOPRAZOLE SODIUM 40 MG PO TBEC
40.0000 mg | DELAYED_RELEASE_TABLET | Freq: Every day | ORAL | Status: DC
  Administered 2022-10-30: 40 mg via ORAL
  Filled 2022-10-30: qty 1

## 2022-10-30 MED ORDER — PREDNISONE 5 MG PO TABS
5.0000 mg | ORAL_TABLET | Freq: Every day | ORAL | Status: DC
  Administered 2022-10-30 – 2022-11-04 (×6): 5 mg via ORAL
  Filled 2022-10-30 (×6): qty 1

## 2022-10-30 MED ORDER — ACETAMINOPHEN 650 MG RE SUPP: 650.0000 mg | Freq: Four times a day (QID) | RECTAL | Status: DC | PRN

## 2022-10-30 MED ORDER — POTASSIUM CHLORIDE CRYS ER 20 MEQ PO TBCR
40.0000 meq | EXTENDED_RELEASE_TABLET | Freq: Once | ORAL | Status: AC
  Administered 2022-10-30: 40 meq via ORAL
  Filled 2022-10-30: qty 2

## 2022-10-30 MED ORDER — VERICIGUAT 10 MG PO TABS
1.0000 | ORAL_TABLET | Freq: Every day | ORAL | Status: DC
  Administered 2022-10-30 – 2022-11-04 (×6): 10 mg via ORAL
  Filled 2022-10-30 (×7): qty 1

## 2022-10-30 NOTE — Progress Notes (Signed)
Advanced Heart Failure Clinic Progress Note   PCP: [name], [place] [facility]rology: Dr. Royce Macadamia HF Cardiologist: Dr. Aundra Dubin  77 y.o. with history of rheumatoid arthritis, CKD stage 3, and nonischemic cardiomyopathy was referred by Dr. Andrey Cota in [place] to establish heart failure care in [place].  Patient has been known to have a cardiomyopathy since 2015.  Cardiac MRI in 2015 showed EF 37% with no LGE.  Coronary angiography at that time showed no significant coronary disease.  Over the next few years, LV EF fluctuated up and down.  In 3/21, echo showed EF down to 20-25%.  She was admitted to the hospital in [place], [place] in 11/21 with CHF exacerbation.  Echo showed EF 20-25%, global hypokinesis, mildly decreased RV systolic function.  Creatinine was noted to be elevated. She was diuresed and sent home, but was readmitted later in 11/21 with ongoing volume overload. Creatinine was up to 2.89.  She was taken off most of her cardiac meds due to soft BP and elevated creatinine. She was discharged to her daughter's house in [place].   Repeated an echo on her in 12/21, EF < 20% with moderate LV dilation and mildly decreased RV systolic function. RHC was done, surprisingly showing low filling pressures and relatively preserved cardiac index at 2.22.   She was admitted in 3/22 with atrial fibrillation, converted to NSR on amiodarone. She went into atrial flutter after this and had TEE-DCCV in 4/22.  TEE showed EF < 20%, moderate LV dilation, mild RV dilation with moderate RV dysfunction, moderate central MR.  CPX in 3/22 showed severe HF limitation.  PYP scan 4/22 was grade 1 with H/CL 1.48, equivocal.  PYP scan repeated in 3/23 and likely negative.   She was evaluated for [facility] or ANTHEM, BNP too high and thought to be too frail.   Cardiac MRI in 10/22 showed mild LV dilation with EF 23%, normal RV size with RVEF 37%, ECV 34%, small area of subendocardial LGE in the mid inferolateral wall  and small area of full thickness LGE in the basal  inferior wall.   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    Follow up 6/23, BP low and midodrine started. Discussed [place]. Felt to be near end-stage HF.  Admitted 07/08/22 after mechanical fall resulting in left hip fracture. S/P left intramedullary nail 07/10/22.  Post op developed AKI. Given IV fluids . Diuretics adjusted. Echo showed EF improved to 45%. Discharged to [facility].   Admitted in 1/24 with acute on chronic systolic CHF and AKI. She required milrinone, RHC showed CI 2.81 on milrinone 0.25.  Echo showed EF < 20%.  Suspected end stage HF.   Echo was done today and reviewed, EF 20-25%, mild LV dilation, severely decreased RV systolic function, mild RV enlargement, severe biatrial enlargement, moderate-severe TR, moderate MR, IVC normal.   Today she returns for HF follow up with her daughter. She had "good days and bad days."  She has poor appetite. Weight is stable.  She continues to fatigue very easily.  She gets tired and short of breath walking around the house.  No chest pain.  Chronically uses 2 pillows.  No lightheadedness or falls.   ECG: NSR, PVC, poor RWP.  Labs (12/21): K 5 => 3.7, creatinine 2.89 => 1.9 => 1.69, BNP 2904 Labs (1/22): K 4, creatinine 1.9, hgb 12.5 Labs (3/22): K 3.6, creatinine 2.36, hgb 11.6, TSH normal Labs (4/22): K 3.7, creatinine 2.25,  LFTs normal, urine immunofixation normal, pro-BNP 6498 Labs (7/22): Myeloma panel with IgG monoclonal protein Labs (10/22): K 4.1, creatinine 1.96 Labs (12/22): BNP 549, TSH normal, K 4.3, creatinine 1.78 Labs (1/23): SCr 2.61, K 4.0, Hgb 11.4  Lab (3/23): Scr 2.29, K 3.9  Labs (6/23): K 4.1, creatinine 3.82, normal LFTs, normal TSH, hgb 9.4 Labs (07/15/22): K 4.4 Creatinine 2.78  Labs (2/24): K 4.1, creatinine 2.76, hgb 8.3  PMH: 1. GERD 2. Rheumatoid arthritis: Followed by rheumatology in [place].  - No known  pulmonary disease from RA.  3. Osteoporosis. 4. Gout 5. CKD stage 4 6. H/o SVT 7. Chronic systolic CHF: She was found to have nonischemic cardiomyopathy with low EF in 2015.  - LHC (2015): Normal coronaries.  - cardiac MRI (2015): EF 37%, no LGE - Echo (3/21): EF 20-25%. - Echo (11/21): EF 20-25%, moderate LV dilation, global hypokinesis, biatrial enlargement, mildly decreased RV systolic function.  - Echo (12/21): EF < 20%, moderate LV dilation, normal RV size/systolic function, moderate MR, IVC normal.  - RHC (1/22): Mean RA 1, PA 39/14, mean PCWP 10, PVR 3.9 WU, CI 2.22.  - CPX (3/22): peak VO2 10.1, VE/VCO2 55, RER 1.13.  Severe functional limitation due to HF.  - TEE (4/22): EF < 20%, moderate LV dilation, mild RV dilation with moderate RV dysfunction, moderate central MR. - PYP scan (4/22): grade 1 with H/CL 1.48. Equivocal. [employer] gene testing negative. - Cardiac MRI (10/22): mild LV dilation with EF 23%, normal RV size with RVEF 37%, ECV 34%, small area of subendocardial LGE in the mid inferolateral wall and small area of full thickness LGE in the basal inferior wall.  - PYP scan (3/23): grade 1, H/CL 1.3.  Probably negative.  - Echo 07/11/22: EF 45-50% RV normal  - Echo (3/24): EF 20-25%, mild LV dilation, severely decreased RV systolic function, mild RV enlargement, severe biatrial enlargement, moderate-severe TR, moderate MR, IVC normal.  8. Atrial fibrillation/flutter: TEE-DCCV in 4/22.  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  10. Varicella zoster with post-herpetic neuralgia 11. L hip fracture , mechanical fall.  12. Anemia  SH: From [place] but recently moved to [place] to live with daughter.  Has 3 children.  Nonsmoker.  No ETOH.   FH: Mother with CHF, father with MI.   ROS: All systems reviewed and negative except as per HPI.   No current facility-administered medications for this encounter.   No current outpatient medications on file.    Facility-Administered Medications Ordered in Other Encounters  Medication Dose Route Frequency Provider Last Rate Last Admin   0.9 %  sodium chloride infusion  250 mL Intravenous PRN Opyd, Ilene Qua, MD       acetaminophen (TYLENOL) tablet 650 mg  650 mg Oral Q6H PRN Opyd, Ilene Qua, MD       Or   acetaminophen (TYLENOL) suppository 650 mg  650 mg Rectal Q6H PRN Opyd, Ilene Qua, MD       bumetanide (BUMEX) tablet 1 mg  1 mg Oral Daily Opyd, Ilene Qua, MD       calcitRIOL (ROCALTROL) capsule 0.25 mcg  0.25 mcg Oral Daily Opyd, Ilene Qua, MD       pantoprazole (PROTONIX) EC tablet 40 mg  40 mg Oral Daily Opyd, Ilene Qua, MD       predniSONE (DELTASONE) tablet 5 mg  5 mg Oral Q breakfast Opyd, Ilene Qua, MD       sodium chloride flush (  NS) 0.9 % injection 3 mL  3 mL Intravenous Q12H Opyd, Ilene Qua, MD       sodium chloride flush (NS) 0.9 % injection 3 mL  3 mL Intravenous Q12H Opyd, Ilene Qua, MD       sodium chloride flush (NS) 0.9 % injection 3 mL  3 mL Intravenous PRN Opyd, Ilene Qua, MD       Vericiguat TABS 10 mg  1 tablet Oral Daily Opyd, Ilene Qua, MD       Wt Readings from Last 3 Encounters:  10/29/22 47.4 kg (104 lb 6.4 oz)  10/06/22 45.4 kg (100 lb)  09/29/22 47.2 kg (104 lb)   BP (!) 90/50   Pulse 85   Wt 47.4 kg (104 lb 6.4 oz)   SpO2 97%   BMI 17.92 kg/m  General: NAD, frail Neck: No JVD, no thyromegaly or thyroid nodule.  Lungs: Clear to auscultation bilaterally with normal respiratory effort. CV: Lateral PMI.  Heart regular S1/S2, no S3/S4, no murmur.  No peripheral edema.  No carotid bruit.  Normal pedal pulses.  Abdomen: Soft, nontender, no hepatosplenomegaly, no distention.  Skin: Intact without lesions or rashes.  Neurologic: Alert and oriented x 3.  Psych: Normal affect. Extremities: No clubbing or cyanosis.  HEENT: Normal.   Assessment/Plan: 1. Chronic systolic CHF: Nonischemic cardiomyopathy.  This has been known since 2015, cath in 2015 showed no  coronary disease and cardiac MRI in 2015 showed no LGE.  Cause is uncertain, familial cardiomyopathy is a concern given nonischemic cardiomyopathy in her mother.  Cannot rule out remote viral myocarditis.Poor appetite and severe HF limitation on CPX in 3/22 is concerning, as is elevated creatinine (suspect cardiorenal syndrome).  Low BP and elevated creatinine have limited her cardiac med regimen. RHC (1/22) showed low filling pressures and actually a relatively preserved cardiac index of 2.22.  PYP scan in 4/22 was equivocal, PYP scan repeated in 3/23 and likely negative.  Cardiac MRI in 10/22 showed mild LV dilation with EF 23%, normal RV size with RVEF 37%, ECV 34%, small area of subendocardial LGE in the mid inferolateral wall and small area of full thickness LGE in the basal inferior wall.   This was not suggestive of cardiac amyloidosis but possibly could be consistent with coronary embolism (though no LV thrombus visualized) versus prior myocarditis versus sarcoidosis. With renal failure, CHF, and atrial arrhythmias, we have assessed for cardiac amyloidosis.  PYP scan was equivocal in 4/22 for transthyretin cardiac amyloidosis.  It was repeated in 3/23 and likely negative.  [employer] gene testing for TTR mutations was negative. The cardiac MRI was not consistent with amyloidosis.  She has MGUS with monoclonal IgG paraprotein, but based on cMRI and slow progression, think AL amyloidosis is unlikely.  Most recent echo was done today, EF 20-25%, mild LV dilation, severely decreased RV systolic function, mild RV enlargement, severe biatrial enlargement, moderate-severe TR, moderate MR, IVC normal. She was admitted in 1/24 and required milrinone.  She is very frail with NYHA class IIIb symptoms and CKD stage IV.  I suspect she is nearing end-stage.  Not a candidate for advanced therapies. She is not volume overloaded on exam. GDMT limited by CKD stage IV and low BP.  - Continue Bumex 1 mg daily. BMET/BNP.  - She  no longer requires midodrine.  - Continue Verquvo 10 mg daily.  - Off dapagliflozin with rise in creatinine, with low BP would not restart.   - Off spironolactone with elevated creatinine and  soft BP.  - Does not have an ICD, would not place with likely end stage.   - Few options at this point, we will re-evaluate her for barostimulation activation therapy (discussed today, she is interested).  2. CKD: Stage 4.  She is followed by Dr. Royce Macadamia. - Check BMET  3. Rheumatoid arthritis: No history of lung involvement. She has been on a low dose of prednisone chronically.  4. Atrial fibrillation/flutter: S/p TEE-DCCV in 4/22.  She is now on amiodarone and in NSR.  - Continue amiodarone 200 mg daily. Check LFTs and TSH.  Will need regular eye exam.  - Continue Eliquis 5 mg bid. CBC today.  5. Anemia: check CBC today.   Followup 6 wks with APP.   Loralie Champagne  10/30/2022

## 2022-10-30 NOTE — ED Notes (Signed)
ED TO INPATIENT HANDOFF REPORT  ED Nurse Name and Phone #: Rip Harbour RN [phone]  S Name/Age/Gender Tracy Nelson 77 y.o. female Room/Bed: 021C/021C  Code Status   Code Status: Full Code  Home/SNF/Other Home Patient oriented to: self, place, time, and situation Is this baseline? Yes   Triage Complete: Triage complete  Chief Complaint Acute on chronic anemia [D64.9]  Triage Note Went for outpatient ECHO today with cardiology. Baseline labs showed HGB of 6.9.    Allergies Allergies  Allergen Reactions   Baclofen Nausea And Vomiting   Penicillin G     Other reaction(s): Unknown   Penicillins Rash    Level of Care/Admitting Diagnosis ED Disposition     ED Disposition  Admit   Condition  --   [place]: [facility] [[id number]]  Level of Care: Telemetry Medical [104]  May place patient in observation at [facility] or [place] if equivalent level of care is available:: Yes  Covid Evaluation: Asymptomatic - no recent exposure (last 10 days) testing not required  Diagnosis: Acute on chronic anemia [id number]  Admitting Physician: Vianne Bulls [id number]  Attending Physician: Vianne Bulls [id number]          B Medical/Surgery History Past Medical History:  Diagnosis Date   Acid reflux 07/15/2020   Arthritis 07/15/2020   CHF (congestive heart failure) ([place])    Colitis 07/15/2020   Dilated cardiomyopathy ([place]) 07/15/2020   HFrEF (heart failure with reduced ejection fraction) ([place]) 07/15/2020   HTN (hypertension) 07/15/2020   Osteoporosis 07/15/2020   Palpitations 07/15/2020   Rheumatoid arthritis ([place]) 07/15/2020   Past Surgical History:  Procedure Laterality Date   CARDIOVERSION N/A 11/08/2020   Procedure: CARDIOVERSION;  Surgeon: [name], MD;  Location: [place];  Service: Cardiovascular;  Laterality: N/A;   HIP FRACTURE SURGERY     INTRAMEDULLARY (IM) NAIL INTERTROCHANTERIC Left 07/10/2022   Procedure:  INTRAMEDULLARY (IM) NAIL INTERTROCHANTERIC;  Surgeon: Leandrew Koyanagi, MD;  Location: [place];  Service: Orthopedics;  Laterality: Left;   RIGHT HEART CATH N/A 08/14/2020   Procedure: RIGHT HEART CATH;  Surgeon: [name], MD;  Location: [place] [facility];  Service: Cardiovascular;  Laterality: N/A;   RIGHT HEART CATH N/A 09/03/2022   Procedure: RIGHT HEART CATH;  Surgeon: [name], MD;  Location: [place] [facility];  Service: Cardiovascular;  Laterality: N/A;   TEE WITHOUT CARDIOVERSION N/A 11/08/2020   Procedure: TRANSESOPHAGEAL ECHOCARDIOGRAM (TEE);  Surgeon: [name], MD;  Location: [facility] ENDOSCOPY;  Service: Cardiovascular;  Laterality: N/A;     A IV Location/Drains/Wounds Patient Lines/Drains/Airways Status     Active Line/Drains/Airways     Name Placement date Placement time Site Days   Peripheral IV 10/30/22 20 G 1" Left Antecubital 10/30/22  0029  Antecubital  less than 1   Peripheral IV 10/30/22 18 G 1.16" Left Forearm 10/30/22  0030  Forearm  less than 1   Wound / Incision (Open or Dehisced) 08/31/22 Other (Comment) Heel Right 08/31/22  0130  Heel  60            Intake/Output Last 24 hours  Intake/Output Summary (Last 24 hours) at 10/30/2022 0700 Last data filed at 10/30/2022 [id number] Gross per 24 hour  Intake 686.42 ml  Output 1025 ml  Net -338.58 ml    Labs/Imaging Results for orders placed or performed during the hospital encounter of 10/29/22 (from the past 48 hour(s))  Type and screen [facility]  [place]     Status: None (Preliminary result)   Collection Time: 10/29/22  7:45 PM  Result Value Ref Range   ABO/RH(D) O POS    Antibody Screen NEG    Sample Expiration 11/01/2022,2359    Unit Number [id number]    Blood Component Type RED CELLS,LR    Unit division 00    Status of Unit ISSUED    Transfusion Status OK TO TRANSFUSE    Crossmatch Result Compatible    Unit Number [id number]    Blood Component Type RED CELLS,LR    Unit  division 00    Status of Unit ISSUED    Transfusion Status OK TO TRANSFUSE    Crossmatch Result      Compatible Performed at [facility], [place] [address] [place], [place] [address]   Comprehensive metabolic panel     Status: Abnormal   Collection Time: 10/29/22  8:10 PM  Result Value Ref Range   Sodium 139 135 - 145 mmol/L   Potassium 3.3 (L) 3.5 - 5.1 mmol/L   Chloride 101 98 - 111 mmol/L   CO2 29 22 - 32 mmol/L   Glucose, Bld 95 70 - 99 mg/dL    Comment: Glucose reference range applies only to samples taken after fasting for at least 8 hours.   BUN 30 (H) 8 - 23 mg/dL   Creatinine, Ser 2.83 (H) 0.44 - 1.00 mg/dL   Calcium 9.0 8.9 - 10.3 mg/dL   Total Protein 6.0 (L) 6.5 - 8.1 g/dL   Albumin 2.7 (L) 3.5 - 5.0 g/dL   AST 27 15 - 41 U/L   ALT 20 0 - 44 U/L   Alkaline Phosphatase 64 38 - 126 U/L   Total Bilirubin 0.7 0.3 - 1.2 mg/dL   GFR, Estimated 17 (L) >60 mL/min    Comment: (NOTE) Calculated using the CKD-EPI Creatinine Equation (2021)    Anion gap 9 5 - 15    Comment: Performed at [facility], [place] [address], [place], [place] [address]  CBC with Differential     Status: Abnormal   Collection Time: 10/29/22  8:10 PM  Result Value Ref Range   WBC 3.2 (L) 4.0 - 10.5 K/uL   RBC 2.43 (L) 3.87 - 5.11 MIL/uL   Hemoglobin 7.6 (L) 12.0 - 15.0 g/dL   HCT 24.5 (L) 36.0 - 46.0 %   MCV 100.8 (H) 80.0 - 100.0 fL   MCH 31.3 26.0 - 34.0 pg   MCHC 31.0 30.0 - 36.0 g/dL   RDW 20.0 (H) 11.5 - 15.5 %   Platelets 219 150 - 400 K/uL   nRBC 0.0 0.0 - 0.2 %   Neutrophils Relative % 67 %   Neutro Abs 2.1 1.7 - 7.7 K/uL   Lymphocytes Relative 26 %   Lymphs Abs 0.8 0.7 - 4.0 K/uL   Monocytes Relative 2 %   Monocytes Absolute 0.1 0.1 - 1.0 K/uL   Eosinophils Relative 3 %   Eosinophils Absolute 0.1 0.0 - 0.5 K/uL   Basophils Relative 1 %   Basophils Absolute 0.0 0.0 - 0.1 K/uL   Immature Granulocytes 1 %   Abs Immature Granulocytes 0.03 0.00 - 0.07 K/uL    Comment:  Performed at [facility], [place] [address] [place], [place] [address]  Protime-INR     Status: None   Collection Time: 10/29/22  8:10 PM  Result Value Ref Range   Prothrombin Time 14.5 11.4 -  15.2 seconds   INR 1.1 0.8 - 1.2    Comment: (NOTE) INR goal varies based on device and disease states. Performed at [facility], [place] [address], [place], [place] [address]   Prepare RBC (crossmatch)     Status: None   Collection Time: 10/29/22 11:52 PM  Result Value Ref Range   Order Confirmation      ORDER PROCESSED BY BLOOD BANK Performed at [facility], [place] [address], [place], [place] [address]   Brain natriuretic peptide     Status: Abnormal   Collection Time: 10/30/22 12:28 AM  Result Value Ref Range   B Natriuretic Peptide 1,500.5 (H) 0.0 - 100.0 pg/mL    Comment: Performed at [facility] [address] [place], [place] [address]  Vitamin B12     Status: None   Collection Time: 10/30/22 12:28 AM  Result Value Ref Range   Vitamin B-12 332 180 - 914 pg/mL    Comment: (NOTE) This assay is not validated for testing neonatal or myeloproliferative syndrome specimens for Vitamin B12 levels. Performed at [facility], [place] [address] [place], [place] [address]   Folate     Status: None   Collection Time: 10/30/22 12:28 AM  Result Value Ref Range   Folate 38.5 >5.9 ng/mL    Comment: Performed at [facility], [place] [address] [place], [place] [address]  Iron and TIBC     Status: Abnormal   Collection Time: 10/30/22 12:28 AM  Result Value Ref Range   Iron 48 28 - 170 ug/dL   TIBC 192 (L) 250 - 450 ug/dL   Saturation Ratios 25 10.4 - 31.8 %   UIBC 144 ug/dL    Comment: Performed at [facility], [place] [address], [place], [place] [address]  Ferritin     Status: Abnormal   Collection Time: 10/30/22 12:28 AM  Result Value Ref Range   Ferritin 1,322 (H) 11 - 307 ng/mL    Comment: Performed at [facility], [place] [address]  [place], [place] [address]  Reticulocytes     Status: Abnormal   Collection Time: 10/30/22 12:28 AM  Result Value Ref Range   Retic Ct Pct 0.7 0.4 - 3.1 %   RBC. 2.19 (L) 3.87 - 5.11 MIL/uL   Retic Count, Absolute 14.9 (L) 19.0 - 186.0 K/uL   Immature Retic Fract 11.9 2.3 - 15.9 %    Comment: Performed at [facility] [address], [place], [place] [address]  Urinalysis, Routine w reflex microscopic -Urine, Clean Catch     Status: None   Collection Time: 10/30/22  1:21 AM  Result Value Ref Range   Color, Urine YELLOW YELLOW   APPearance CLEAR CLEAR   Specific Gravity, Urine 1.010 1.005 - 1.030   pH 6.0 5.0 - 8.0   Glucose, UA NEGATIVE NEGATIVE mg/dL   Hgb urine dipstick NEGATIVE NEGATIVE   Bilirubin Urine NEGATIVE NEGATIVE   Ketones, ur NEGATIVE NEGATIVE mg/dL   Protein, ur NEGATIVE NEGATIVE mg/dL   Nitrite NEGATIVE NEGATIVE   Leukocytes,Ua NEGATIVE NEGATIVE    Comment: Performed at [facility] [address] [place], [place] [address]   DG Chest Port 1 View  Result Date: 10/29/2022 CLINICAL DATA:  Dyspnea on exertion. EXAM: PORTABLE CHEST 1 VIEW COMPARISON:  Chest radiograph dated September 07, 2018. FINDINGS: The heart is enlarged. Aorta is tortuous. Left basilar opacity with atelectasis and/or small  effusion. Right lung is clear. Thoracic spondylosis. IMPRESSION: 1. Left basilar opacity with atelectasis and/or small effusion. 2. Cardiomegaly. Electronically Signed   By: Keane Police D.O.   On: 10/29/2022 23:37   ECHOCARDIOGRAM COMPLETE  Result Date: 10/29/2022    ECHOCARDIOGRAM REPORT   Patient Name:   Tracy Nelson Date of Exam: 10/29/2022 Medical Rec #:  [id number]         Height:       64.0 in Accession #:    [id number]        Weight:       100.0 lb Date of Birth:  1946-01-11          BSA:          1.457 m Patient Age:    74 years          BP:           98/66 mmHg Patient Gender: F                 HR:           90 bpm. Exam Location:  Inpatient Procedure: 2D Echo,  Cardiac Doppler and Color Doppler Indications:    [id number] Chronic systolic (congestive) heart failure  History:        Patient has prior history of Echocardiogram examinations.                 Cardiomyopathy; Risk Factors:Hypertension.  Sonographer:    Phineas Douglas Referring Phys: [id number] AMY D CLEGG IMPRESSIONS  1. Left ventricular ejection fraction, by estimation, is 20 to 25%. The left ventricle has severely decreased function. The left ventricle demonstrates global hypokinesis. The left ventricular internal cavity size was mildly dilated. Left ventricular diastolic function could not be evaluated. Elevated left ventricular end-diastolic pressure.  2. Right ventricular systolic function is severely reduced. The right ventricular size is mildly enlarged. There is normal pulmonary artery systolic pressure.  3. Left atrial size was severely dilated.  4. Right atrial size was severely dilated.  5. The mitral valve is degenerative. Moderate mitral valve regurgitation. No evidence of mitral stenosis.  6. Tricuspid valve regurgitation is moderate to severe.  7. The aortic valve is calcified. Aortic valve regurgitation is mild to moderate. Aortic valve sclerosis/calcification is present, without any evidence of aortic stenosis.  8. Abdominal aorta is normal size.  9. The inferior vena cava is normal in size with greater than 50% respiratory variability, suggesting right atrial pressure of 3 mmHg. Comparison(s): A prior study was performed on 08/2022. No significant change from prior study. FINDINGS  Left Ventricle: Left ventricular ejection fraction, by estimation, is 20 to 25%. The left ventricle has severely decreased function. The left ventricle demonstrates global hypokinesis. The left ventricular internal cavity size was mildly dilated. There is no left ventricular hypertrophy. Left ventricular diastolic function could not be evaluated due to mitral regurgitation (moderate or greater). Left ventricular diastolic  function could not be evaluated. Elevated left ventricular end-diastolic pressure. Right Ventricle: The right ventricular size is mildly enlarged. No increase in right ventricular wall thickness. Right ventricular systolic function is severely reduced. There is normal pulmonary artery systolic pressure. The tricuspid regurgitant velocity is 2.82 m/s, and with an assumed right atrial pressure of 3 mmHg, the estimated right ventricular systolic pressure is [id number]. Left Atrium: Left atrial size was severely dilated. Right Atrium: Right atrial size was severely dilated. Pericardium: There is no evidence of pericardial effusion. Mitral Valve: The mitral valve is degenerative  in appearance. Moderate mitral valve regurgitation. No evidence of mitral valve stenosis. Tricuspid Valve: The tricuspid valve is normal in structure. Tricuspid valve regurgitation is moderate to severe. No evidence of tricuspid stenosis. Aortic Valve: The aortic valve is calcified. Aortic valve regurgitation is mild to moderate. Aortic regurgitation PHT measures 332 msec. Aortic valve sclerosis/calcification is present, without any evidence of aortic stenosis. Pulmonic Valve: The pulmonic valve was not well visualized. Pulmonic valve regurgitation is mild. No evidence of pulmonic stenosis. Aorta: Abdominal aorta is normal size. The aortic root is normal in size and structure. Venous: The inferior vena cava is normal in size with greater than 50% respiratory variability, suggesting right atrial pressure of 3 mmHg. IAS/Shunts: No atrial level shunt detected by color flow Doppler.  LEFT VENTRICLE PLAX 2D LVIDd:         5.90 cm      Diastology LVIDs:         5.40 cm      LV e' medial:    4.35 cm/s LV PW:         1.00 cm      LV E/e' medial:  20.2 LV IVS:        0.80 cm      LV e' lateral:   7.72 cm/s LVOT diam:     2.00 cm      LV E/e' lateral: 11.4 LV SV:         56 LV SV Index:   39 LVOT Area:     3.14 cm  LV Volumes (MOD) LV vol d, MOD A2C: 190.0  ml LV vol d, MOD A4C: 161.0 ml LV vol s, MOD A2C: 143.0 ml LV vol s, MOD A4C: 116.0 ml LV SV MOD A2C:     47.0 ml LV SV MOD A4C:     161.0 ml LV SV MOD BP:      43.5 ml RIGHT VENTRICLE             IVC RV Basal diam:  4.70 cm     IVC diam: 2.00 cm RV S prime:     13.30 cm/s TAPSE (M-mode): 2.5 cm LEFT ATRIUM             Index        RIGHT ATRIUM           Index LA diam:        2.60 cm 1.78 cm/m   RA Area:     24.00 cm LA Vol (A2C):   87.3 ml 59.91 ml/m  RA Volume:   87.10 ml  59.77 ml/m LA Vol (A4C):   62.5 ml 42.89 ml/m LA Biplane Vol: 75.6 ml 51.88 ml/m  AORTIC VALVE LVOT Vmax:   107.00 cm/s LVOT Vmean:  68.200 cm/s LVOT VTI:    0.179 m AI PHT:      332 msec  AORTA Ao Root diam: 2.90 cm Ao Asc diam:  3.10 cm MITRAL VALVE                  TRICUSPID VALVE MV Area (PHT): 6.71 cm       TR Peak grad:   31.8 mmHg MV Decel Time: 113 msec       TR Vmax:        282.00 cm/s MR Peak grad:    84.3 mmHg MR Mean grad:    45.0 mmHg    SHUNTS MR Vmax:         459.00 cm/s  Systemic  VTI:  0.18 m MR Vmean:        316.0 cm/s   Systemic Diam: 2.00 cm MR PISA:         0.57 cm MR PISA Eff ROA: 4 mm MR PISA Radius:  0.30 cm MV E velocity: 87.90 cm/s MV A velocity: 160.00 cm/s MV E/A ratio:  0.55 Kardie Tobb DO Electronically signed by Berniece Salines DO Signature Date/Time: 10/29/2022/4:04:54 PM    Final     Pending Labs Unresulted Labs (From admission, onward)     Start     Ordered   10/30/22 [id number]  Basic metabolic panel  Daily,   R      10/30/22 0149   10/30/22 0500  Magnesium  Tomorrow morning,   R        10/30/22 0149   10/30/22 0500  Hemoglobin  Now then every 12 hours,   R (with TIMED occurrences)      10/30/22 0149   10/30/22 0500  Hematocrit  Now then every 12 hours,   R (with TIMED occurrences)      10/30/22 0149            Vitals/Pain Today's Vitals   10/30/22 0332 10/30/22 0356 10/30/22 0416 10/30/22 0618  BP: (!) 110/91 115/81 118/82 114/80  Pulse: 85 85 85 85  Resp: 18 18 17 13   Temp: 98 F (36.7  C)  98.1 F (36.7 C) 98 F (36.7 C)  TempSrc: Oral  Oral Oral  SpO2: 96% 97% 97% 97%  PainSc:    0-No pain    Isolation Precautions No active isolations  Medications Medications  bumetanide (BUMEX) tablet 1 mg (has no administration in time range)  Vericiguat TABS 10 mg (has no administration in time range)  calcitRIOL (ROCALTROL) capsule 0.25 mcg (has no administration in time range)  predniSONE (DELTASONE) tablet 5 mg (has no administration in time range)  sodium chloride flush (NS) 0.9 % injection 3 mL (3 mLs Intravenous Not Given 10/30/22 0239)  sodium chloride flush (NS) 0.9 % injection 3 mL (3 mLs Intravenous Not Given 10/30/22 0240)  sodium chloride flush (NS) 0.9 % injection 3 mL (has no administration in time range)  0.9 %  sodium chloride infusion (has no administration in time range)  acetaminophen (TYLENOL) tablet 650 mg (has no administration in time range)    Or  acetaminophen (TYLENOL) suppository 650 mg (has no administration in time range)  pantoprazole (PROTONIX) EC tablet 40 mg (has no administration in time range)  0.9 %  sodium chloride infusion (Manually program via Guardrails IV Fluids) (0 mLs Intravenous Stopped 10/30/22 0622)  furosemide (LASIX) injection 40 mg (40 mg Intravenous Given 10/30/22 0055)  potassium chloride (KLOR-CON M) CR tablet 10 mEq (10 mEq Oral Given 10/30/22 0325)  pantoprazole (PROTONIX) injection 40 mg (40 mg Intravenous Given 10/30/22 0326)    Mobility walks with device     Focused Assessments Cardiac Assessment Handoff:  Cardiac Rhythm: Normal sinus rhythm No results found for: "CKTOTAL", "CKMB", "CKMBINDEX", "TROPONINI" Lab Results  Component Value Date   DDIMER 1.95 (H) 08/30/2022   Does the Patient currently have chest pain? No   , Neuro Assessment Handoff:  Swallow screen pass? Yes  Cardiac Rhythm: Normal sinus rhythm       Neuro Assessment: Within Defined Limits Neuro Checks:      Has TPA been given? No If  patient is a Neuro Trauma and patient is going to OR before floor call report to 4N Charge nurse:  [phone] or [phone]   R Recommendations: See Admitting Provider Note  Report given to:   Additional Notes: Pt A&Ox4. Continent using bedpan often. Pt received 40 mg lasix and 2 Units PRBC. Pt reports uses walker at home for ambulation.

## 2022-10-30 NOTE — H&P (Signed)
History and Physical    Tracy Nelson [id number] DOB: 04-22-1946 DOA: 10/29/2022  PCP: [name], [name]   Patient coming from: Home   Chief Complaint: Fatigue, DOE, low Hgb   HPI: Tracy Nelson is a 77 y.o. female with medical history significant for rheumatoid arthritis, hypertension, PAF on Eliquis, chronic systolic CHF, mitral regurgitation, tricuspid regurgitation, and chronic anemia who was directed to the emergency department when she was noted to have hemoglobin 6.9 on outpatient labs.  Patient reports that she has had increased fatigue and exertional dyspnea for roughly a week, has had increase in her chronic acid reflux over the same interval with multiple episodes of nonbloody vomiting immediately after eating.  She has some abdominal discomfort that she attributes to the acid reflux, but denies pain per se.  She denies any melena or hematochezia.  ED Course: Upon arrival to the ED, patient is found to be afebrile and saturating well on room air with systolic blood pressure of 90 and greater.  EKG demonstrates sinus rhythm with PACs.  Chest x-ray notable for cardiomegaly and left basilar atelectasis and/or small pleural effusion.  Labs are most notable for WBC 3200, hemoglobin 7.6, potassium 3.3, creatinine 2.83, and albumin 2.7.  Lasix and 2 units of packed RBC were ordered by the ED physician.  Review of Systems:  All other systems reviewed and apart from HPI, are negative.  Past Medical History:  Diagnosis Date   Acid reflux 07/15/2020   Arthritis 07/15/2020   CHF (congestive heart failure) ([place])    Colitis 07/15/2020   Dilated cardiomyopathy ([place]) 07/15/2020   HFrEF (heart failure with reduced ejection fraction) ([place]) 07/15/2020   HTN (hypertension) 07/15/2020   Osteoporosis 07/15/2020   Palpitations 07/15/2020   Rheumatoid arthritis ([place]) 07/15/2020    Past Surgical History:  Procedure Laterality Date   CARDIOVERSION N/A 11/08/2020   Procedure: CARDIOVERSION;   Surgeon: [name], MD;  Location: [place];  Service: Cardiovascular;  Laterality: N/A;   HIP FRACTURE SURGERY     INTRAMEDULLARY (IM) NAIL INTERTROCHANTERIC Left 07/10/2022   Procedure: INTRAMEDULLARY (IM) NAIL INTERTROCHANTERIC;  Surgeon: Leandrew Koyanagi, MD;  Location: [place];  Service: Orthopedics;  Laterality: Left;   RIGHT HEART CATH N/A 08/14/2020   Procedure: RIGHT HEART CATH;  Surgeon: [name], MD;  Location: [place] [facility];  Service: Cardiovascular;  Laterality: N/A;   RIGHT HEART CATH N/A 09/03/2022   Procedure: RIGHT HEART CATH;  Surgeon: [name], MD;  Location: [place] [facility];  Service: Cardiovascular;  Laterality: N/A;   TEE WITHOUT CARDIOVERSION N/A 11/08/2020   Procedure: TRANSESOPHAGEAL ECHOCARDIOGRAM (TEE);  Surgeon: [name], MD;  [facility] ENDOSCOPY;  Service: Cardiovascular;  Laterality: N/A;    Social History:   reports that she has never smoked. She has never used smokeless tobacco. She reports that she does not drink alcohol and does not use drugs.  Allergies  Allergen Reactions   Baclofen Nausea And Vomiting   Penicillin G     Other reaction(s): Unknown   Penicillins Rash    Family History  Problem Relation Age of Onset   Hypertension Mother    Diabetes Mother    Hypertension Father    Diabetes Father    Breast cancer Maternal Aunt    Breast cancer Paternal Aunt    Arthritis Maternal Grandmother    Lung disease Paternal Grandfather    Cancer Brother      Prior to Admission medications  Medication Sig Start Date End Date Taking? Authorizing Provider  amiodarone (PACERONE) 200 MG tablet TAKE 1 TABLET BY MOUTH EVERY DAY 12/19/21   [name], MD  azaTHIOprine (IMURAN) 50 MG tablet Take 2 tablets (100 mg total) by mouth daily. 09/29/22   Rice, Resa Miner, MD  bumetanide (BUMEX) 1 MG tablet Take 1 tablet (1 mg total) by mouth daily. 09/12/22   Domenic Polite, MD  calcitRIOL (ROCALTROL) 0.25 MCG capsule  Take 0.25 mcg by mouth daily. 06/25/22   [provider]  collagenase (SANTYL) 250 UNIT/GM ointment Apply 1 Application topically daily. 09/28/22   Trula Slade, DPM  ELIQUIS 2.5 MG TABS tablet TAKE 1 TABLET BY MOUTH TWICE A DAY 10/05/22   [name], MD  Ensure (ENSURE) Take 237 mLs by mouth daily as needed (for meal supplement).    [provider]  folic acid (FOLVITE) 1 MG tablet TAKE 1 TABLET BY MOUTH EVERY DAY 07/22/22   [name], MD  hydroxypropyl methylcellulose / hypromellose (ISOPTO TEARS / GONIOVISC) 2.5 % ophthalmic solution Place 1 drop into both eyes as needed for dry eyes.    [provider]  Menthol, Topical Analgesic, (ICY HOT BACK EX) Apply 1 application topically daily as needed (pain).    [provider]  Multiple Vitamin (MULTIVITAMIN) tablet Take 1 tablet by mouth daily.    [provider]  omeprazole (PRILOSEC) 40 MG capsule Take 1 capsule (40 mg total) by mouth daily as needed (for acid reflux). 10/13/22   [name], [name]  polyethylene glycol (MIRALAX / GLYCOLAX) 17 g packet Take 17 g by mouth daily as needed for mild constipation.    [provider]  predniSONE (DELTASONE) 5 MG tablet Take 1 tablet (5 mg total) by mouth daily with breakfast. 09/29/22   Rice, Resa Miner, MD  Vericiguat (VERQUVO) 10 MG TABS Take 1 tablet (10 mg total) by mouth daily. 10/09/22   [name], MD    Physical Exam: Vitals:   10/29/22 2336 10/30/22 0030 10/30/22 0050 10/30/22 0106  BP: 98/71 97/78 94/73  109/74  Pulse: 89 90 90 89  Resp:  16 20 19   Temp: 98.5 F (36.9 C) 97.9 F (36.6 C) 98.4 F (36.9 C) 98.1 F (36.7 C)  TempSrc: Oral Oral Oral Oral  SpO2: 97% 97% 97% 95%    Constitutional: NAD, diaphoresis   Eyes: PERTLA, lids and conjunctivae normal ENMT: Mucous membranes are moist. Posterior pharynx clear of any exudate or lesions.   Neck: supple, no masses  Respiratory:  no wheezing, no crackles. No  accessory muscle use.  Cardiovascular: S1 & S2 heard, regular rate and rhythm. Trace lower extremity edema.  Abdomen: No distension, no tenderness, soft. Bowel sounds active.  Musculoskeletal: no clubbing / cyanosis. No joint deformity upper and lower extremities.   Skin: no significant rashes, lesions, ulcers. Warm, dry, well-perfused. Neurologic: CN 2-12 grossly intact. Moving all extremities. Alert and oriented.  Psychiatric: Calm. Cooperative.    Labs and Imaging on Admission: I have personally reviewed following labs and imaging studies  CBC: Recent Labs  Lab 10/29/22 1658 10/29/22 2010  WBC 3.0* 3.2*  NEUTROABS  --  2.1  HGB 6.9* 7.6*  HCT 22.3* 24.5*  MCV 100.5* 100.8*  PLT 197 [id number]   Basic Metabolic Panel: Recent Labs  Lab 10/29/22 1658 10/29/22 2010  NA 138 139  K 3.1* 3.3*  CL 98 101  CO2 28 29  GLUCOSE 100* 95  BUN 30* 30*  CREATININE 2.79* 2.83*  CALCIUM 8.6* 9.0   GFR: Estimated Creatinine Clearance: 12.5 mL/min (A) (by C-G formula based on SCr of 2.83 mg/dL (H)). Liver Function Tests: Recent Labs  Lab 10/29/22 1658 10/29/22 2010  AST 22 27  ALT 18 20  ALKPHOS 57 64  BILITOT 0.7 0.7  PROT 5.7* 6.0*  ALBUMIN 2.5* 2.7*   No results for input(s): "LIPASE", "AMYLASE" in the last 168 hours. No results for input(s): "AMMONIA" in the last 168 hours. Coagulation Profile: Recent Labs  Lab 10/29/22 2010  INR 1.1   Cardiac Enzymes: No results for input(s): "CKTOTAL", "CKMB", "CKMBINDEX", "TROPONINI" in the last 168 hours. BNP (last 3 results) No results for input(s): "PROBNP" in the last 8760 hours. HbA1C: No results for input(s): "HGBA1C" in the last 72 hours. CBG: No results for input(s): "GLUCAP" in the last 168 hours. Lipid Profile: No results for input(s): "CHOL", "HDL", "LDLCALC", "TRIG", "CHOLHDL", "LDLDIRECT" in the last 72 hours. Thyroid Function Tests: Recent Labs    10/29/22 1658  TSH 2.239   Anemia Panel: Recent Labs     10/30/22 0028  VITAMINB12 332  RETICCTPCT 0.7   Urine analysis:    Component Value Date/Time   COLORURINE YELLOW 10/30/2022 0121   APPEARANCEUR CLEAR 10/30/2022 0121   LABSPEC 1.010 10/30/2022 0121   PHURINE 6.0 10/30/2022 0121   GLUCOSEU NEGATIVE 10/30/2022 0121   HGBUR NEGATIVE 10/30/2022 0121   BILIRUBINUR NEGATIVE 10/30/2022 0121   BILIRUBINUR negative 06/04/2021 1151   KETONESUR NEGATIVE 10/30/2022 0121   PROTEINUR NEGATIVE 10/30/2022 0121   UROBILINOGEN 0.2 06/04/2021 1151   NITRITE NEGATIVE 10/30/2022 0121   LEUKOCYTESUR NEGATIVE 10/30/2022 0121   Sepsis Labs: @LABRCNTIP (procalcitonin:4,lacticidven:4) )No results found for this or any previous visit (from the past 240 hour(s)).   Radiological Exams on Admission: DG Chest Port 1 View  Result Date: 10/29/2022 CLINICAL DATA:  Dyspnea on exertion. EXAM: PORTABLE CHEST 1 VIEW COMPARISON:  Chest radiograph dated September 07, 2018. FINDINGS: The heart is enlarged. Aorta is tortuous. Left basilar opacity with atelectasis and/or small effusion. Right lung is clear. Thoracic spondylosis. IMPRESSION: 1. Left basilar opacity with atelectasis and/or small effusion. 2. Cardiomegaly. Electronically Signed   By: Keane Police D.O.   On: 10/29/2022 23:37   ECHOCARDIOGRAM COMPLETE  Result Date: 10/29/2022    ECHOCARDIOGRAM REPORT   Patient Name:   Tracy Nelson Date of Exam: 10/29/2022 Medical Rec #:  [id number]         Height:       64.0 in Accession #:    [id number]        Weight:       100.0 lb Date of Birth:  09/28/1945          BSA:          1.457 m Patient Age:    3 years          BP:           98/66 mmHg Patient Gender: F                 HR:           90 bpm. Exam Location:  Inpatient Procedure: 2D Echo, Cardiac Doppler and Color Doppler Indications:    [id number] Chronic systolic (congestive) heart failure  History:        Patient has prior history of Echocardiogram examinations.                 Cardiomyopathy; Risk Factors:Hypertension.  Sonographer:    Phineas Douglas Referring Phys: [phone] AMY D CLEGG IMPRESSIONS  1. Left ventricular ejection fraction, by estimation, is 20 to 25%. The left ventricle has severely decreased function. The left ventricle demonstrates global hypokinesis. The left ventricular internal cavity size was mildly dilated. Left ventricular diastolic function could not be evaluated. Elevated left ventricular end-diastolic pressure.  2. Right ventricular systolic function is severely reduced. The right ventricular size is mildly enlarged. There is normal pulmonary artery systolic pressure.  3. Left atrial size was severely dilated.  4. Right atrial size was severely dilated.  5. The mitral valve is degenerative. Moderate mitral valve regurgitation. No evidence of mitral stenosis.  6. Tricuspid valve regurgitation is moderate to severe.  7. The aortic valve is calcified. Aortic valve regurgitation is mild to moderate. Aortic valve sclerosis/calcification is present, without any evidence of aortic stenosis.  8. Abdominal aorta is normal size.  9. The inferior vena cava is normal in size with greater than 50% respiratory variability, suggesting right atrial pressure of 3 mmHg. Comparison(s): A prior study was performed on 08/2022. No significant change from prior study. FINDINGS  Left Ventricle: Left ventricular ejection fraction, by estimation, is 20 to 25%. The left ventricle has severely decreased function. The left ventricle demonstrates global hypokinesis. The left ventricular internal cavity size was mildly dilated. There is no left ventricular hypertrophy. Left ventricular diastolic function could not be evaluated due to mitral regurgitation (moderate or greater). Left ventricular diastolic function could not be evaluated. Elevated left ventricular end-diastolic pressure. Right Ventricle: The right ventricular size is mildly enlarged. No increase in right ventricular wall thickness. Right ventricular systolic function is  severely reduced. There is normal pulmonary artery systolic pressure. The tricuspid regurgitant velocity is 2.82 m/s, and with an assumed right atrial pressure of 3 mmHg, the estimated right ventricular systolic pressure is [id number]. Left Atrium: Left atrial size was severely dilated. Right Atrium: Right atrial size was severely dilated. Pericardium: There is no evidence of pericardial effusion. Mitral Valve: The mitral valve is degenerative in appearance. Moderate mitral valve regurgitation. No evidence of mitral valve stenosis. Tricuspid Valve: The tricuspid valve is normal in structure. Tricuspid valve regurgitation is moderate to severe. No evidence of tricuspid stenosis. Aortic Valve: The aortic valve is calcified. Aortic valve regurgitation is mild to moderate. Aortic regurgitation PHT measures 332 msec. Aortic valve sclerosis/calcification is present, without any evidence of aortic stenosis. Pulmonic Valve: The pulmonic valve was not well visualized. Pulmonic valve regurgitation is mild. No evidence of pulmonic stenosis. Aorta: Abdominal aorta is normal size. The aortic root is normal in size and structure. Venous: The inferior vena cava is normal in size with greater than 50% respiratory variability, suggesting right atrial pressure of 3 mmHg. IAS/Shunts: No atrial level shunt detected by color flow Doppler.  LEFT VENTRICLE PLAX 2D LVIDd:         5.90 cm      Diastology LVIDs:         5.40 cm      LV e' medial:    4.35 cm/s LV PW:         1.00 cm      LV E/e' medial:  20.2 LV IVS:        0.80 cm      LV e' lateral:   7.72 cm/s LVOT diam:     2.00 cm      LV E/e' lateral: 11.4 LV SV:         56  LV SV Index:   39 LVOT Area:     3.14 cm  LV Volumes (MOD) LV vol d, MOD A2C: 190.0 ml LV vol d, MOD A4C: 161.0 ml LV vol s, MOD A2C: 143.0 ml LV vol s, MOD A4C: 116.0 ml LV SV MOD A2C:     47.0 ml LV SV MOD A4C:     161.0 ml LV SV MOD BP:      43.5 ml RIGHT VENTRICLE             IVC RV Basal diam:  4.70 cm     IVC  diam: 2.00 cm RV S prime:     13.30 cm/s TAPSE (M-mode): 2.5 cm LEFT ATRIUM             Index        RIGHT ATRIUM           Index LA diam:        2.60 cm 1.78 cm/m   RA Area:     24.00 cm LA Vol (A2C):   87.3 ml 59.91 ml/m  RA Volume:   87.10 ml  59.77 ml/m LA Vol (A4C):   62.5 ml 42.89 ml/m LA Biplane Vol: 75.6 ml 51.88 ml/m  AORTIC VALVE LVOT Vmax:   107.00 cm/s LVOT Vmean:  68.200 cm/s LVOT VTI:    0.179 m AI PHT:      332 msec  AORTA Ao Root diam: 2.90 cm Ao Asc diam:  3.10 cm MITRAL VALVE                  TRICUSPID VALVE MV Area (PHT): 6.71 cm       TR Peak grad:   31.8 mmHg MV Decel Time: 113 msec       TR Vmax:        282.00 cm/s MR Peak grad:    84.3 mmHg MR Mean grad:    45.0 mmHg    SHUNTS MR Vmax:         459.00 cm/s  Systemic VTI:  0.18 m MR Vmean:        316.0 cm/s   Systemic Diam: 2.00 cm MR PISA:         0.57 cm MR PISA Eff ROA: 4 mm MR PISA Radius:  0.30 cm MV E velocity: 87.90 cm/s MV A velocity: 160.00 cm/s MV E/A ratio:  0.55 Kardie Tobb DO Electronically signed by Berniece Salines DO Signature Date/Time: 10/29/2022/4:04:54 PM    Final     EKG: Independently reviewed. Sinus rhythm, PACs, QTc 502 ms.   Assessment/Plan   1. Acute on chronic anemia  - Hgb was 6.9 on outpatient labs and she is noted to have new leukopenia and macrocytosis   - She denies melena or hematochezia  - DDx includes occult GI bleeding, hematologic toxicity of Imuran, and myeloma (M spike noted in January 2024)  - Check anemia panel and FOBT, continue bowel rest and hold Eliquis pending FOBT, hold Imuran, check post-transfusion CBC and trend H&H    2. Chronic systolic CHF  - EF was 0000000 with global HK, moderate MR, and moderate-severe TR on TTE from 10/29/22  - Appears compensated  - Give IV Lasix with blood transfusion, continue Bumex and voriciguat, monitor wt and I/Os    3. CKD stage IV  - Appears close to baseline  - Renally-dose medications, monitor    4. Rheumatoid arthritis  - Continue low-dose  prednisone, hold Imuran pending anemia workup  5. Paroxysmal atrial fibrillation  - Hold Eliquis pending anemia workup - QT is prolonged; plan to replace potassium, check magnesium, and repeat EKG in am prior to giving amiodarone   6. Hypokalemia  - Replace and repeat chem panel   7. Prolonged QT interval  - Replace potassium, check magnesium level, repeat EKG in am prior to amiodarone    DVT prophylaxis: SCDs  Code Status: Full  Level of Care: Level of care: Telemetry Medical Family Communication: None present  Disposition Plan:  Patient is from: home  Anticipated d/c is to: Home  Anticipated d/c date is: 3/23 or 11/01/22  Patient currently: Pending stable H&H, FOBT  Consults called: none  Admission status: Observation     Vianne Bulls, MD [facility]  10/30/2022, 1:49 AM

## 2022-10-30 NOTE — Telephone Encounter (Signed)
error 

## 2022-10-30 NOTE — Consult Note (Addendum)
Consultation  Referring Provider:   [facility] Dr. Alfredia Ferguson Primary Care Physician:  [name], [name] Primary Gastroenterologist:  Dr. Ardis Hughs       Reason for Consultation:     Acute on chronic anemia, GERD   Impression    Acute on chronic anemia in setting of CKD stage IV and chronic systolic heart failure on Eliquis Patient's had chronic anemia gradually worsening over the last 2 years Baseline 9-10 2022's been 8-9 2023 on admission was 7.6. No overt GI bleeding, pending FOBT Iron 48, saturation ratio 25 TIBC 192, ferritin 1300 With no true iron deficiency anemia no overt GI bleeding appears to be more anemia of chronic disease or associated with CKD.  GERD with intermittent dysphagia globulus sensation Patient's had this since at least 2022 Only on PPI once daily outpatient  Chronic systolic heart failure Echo yesterday EF 20-25% BNP on admission over 3000  CKD stage IV Likely contributing to anemia  Paroxysmal atrial fibrillation and QT prolongation On Eliquis Electrolyte abnormalities likely contributing to QT prolongation Correct  Rheumatoid arthritis Potentially be contributing to dysmotility   LOS: 0 days     Plan   77 year old female with worsening anemia for the last 2 years, no overt GI bleeding, no iron deficiency, CKD stage IV/end-stage CHF, dysphagia and worsening GERD. Without patient is describing her symptoms do wonder potential hiatal hernia, GERD, dysmotility, possible stricture, gastritis. With patient's comorbidities very high risk for endoscopic evaluation, without any overt GI bleeding, would not suggest endoscopic evaluation at this time. Would optimize medications with PPI twice daily Carafate TID1 if does not interact with cardiac medications Small frequent meals Consider barium versus upper GI versus SLP Monitor H&H, transfuse for less than 7, monitor for fluid overload  Further recommendations per Dr. Silverio Decamp Thank you for your kind  consultation, we will continue to follow.         HPI:   Tracy Nelson is a 77 y.o. female with past medical history significant for severe nonischemic CHF EF 20-25%, atrial flutter on Eliquis, rheumatoid arthritis, reflux, chronic anemia directed to the ER for anemia.  Patient was having increased fatigue and exertional dyspnea for a week.  Patient is also had increased reflux with nonbloody emesis after eating.  Abdominal discomfort. Denies melena or hematochezia.  In the ER patient found to have hypotension systolic 90 otherwise afebrile saturating well on room air. EKG PA-C normal sinus rhythm. Chest x-ray cardiomegaly and left basilar atelectasis/small pleural effusion.  In the ER Hgb 7.6  compared to 09/11/2022 hemoglobin 8.5, baseline appears to be 8-9 since end of 2023 and 9-10 in 2022 Would have the results potassium 3.3 09/11/2022 BUN 64, creatinine 2.76 on admission  BUN 30, creatinine 2.79  no elevation of BUN.  AST 27, ALT 20, alk phos 64.  11/29/2020 office visit with Dr. Ardis Hughs for evaluation of dysphagia and difficulty swallowing.  At that time due to patient's congestive heart failure and atrial flutter, patient was treated conservatively with nystatin swish and spit for possible candidiasis esophagitis. 10/29/2022 echocardiogram EF 2025% left ventricle global hypokinesis moderate MR moderate to severe TR mild to moderate AR.  No aortic stenosis.   Abnormal ED labs: Abnormal Labs Reviewed  COMPREHENSIVE METABOLIC PANEL - Abnormal; Notable for the following components:      Result Value   Potassium 3.3 (*)    BUN 30 (*)    Creatinine, Ser 2.83 (*)    Total Protein 6.0 (*)  Albumin 2.7 (*)    GFR, Estimated 17 (*)    All other components within normal limits  CBC WITH DIFFERENTIAL/PLATELET - Abnormal; Notable for the following components:   WBC 3.2 (*)    RBC 2.43 (*)    Hemoglobin 7.6 (*)    HCT 24.5 (*)    MCV 100.8 (*)    RDW 20.0 (*)    All other components  within normal limits  BRAIN NATRIURETIC PEPTIDE - Abnormal; Notable for the following components:   B Natriuretic Peptide 1,500.5 (*)    All other components within normal limits  IRON AND TIBC - Abnormal; Notable for the following components:   TIBC 192 (*)    All other components within normal limits  FERRITIN - Abnormal; Notable for the following components:   Ferritin 1,322 (*)    All other components within normal limits  RETICULOCYTES - Abnormal; Notable for the following components:   RBC. 2.19 (*)    Retic Count, Absolute 14.9 (*)    All other components within normal limits  BASIC METABOLIC PANEL - Abnormal; Notable for the following components:   BUN 35 (*)    Creatinine, Ser 2.82 (*)    Calcium 8.7 (*)    GFR, Estimated 17 (*)    All other components within normal limits  HEMOGLOBIN - Abnormal; Notable for the following components:   Hemoglobin 10.5 (*)    All other components within normal limits  HEMATOCRIT - Abnormal; Notable for the following components:   HCT 31.8 (*)    All other components within normal limits     Past Medical History:  Diagnosis Date   Acid reflux 07/15/2020   Arthritis 07/15/2020   CHF (congestive heart failure) ([place])    Colitis 07/15/2020   Dilated cardiomyopathy ([place]) 07/15/2020   HFrEF (heart failure with reduced ejection fraction) ([place]) 07/15/2020   HTN (hypertension) 07/15/2020   Osteoporosis 07/15/2020   Palpitations 07/15/2020   Rheumatoid arthritis ([place]) 07/15/2020    Surgical History:  She  has a past surgical history that includes RIGHT HEART CATH (N/A, 08/14/2020); TEE without cardioversion (N/A, 11/08/2020); Cardioversion (N/A, 11/08/2020); Intramedullary (im) nail intertrochanteric (Left, 07/10/2022); RIGHT HEART CATH (N/A, 09/03/2022); and Hip fracture surgery. Family History:  Her family history includes Arthritis in her maternal grandmother; Breast cancer in her maternal aunt and paternal aunt; Cancer in her brother; Diabetes  in her father and mother; Hypertension in her father and mother; Lung disease in her paternal grandfather. Social History:   reports that she has never smoked. She has never used smokeless tobacco. She reports that she does not drink alcohol and does not use drugs.  Prior to Admission medications   Medication Sig Start Date End Date Taking? Authorizing Provider  sodium bicarbonate 650 MG tablet Take 650 mg by mouth daily. 10/26/22  Yes [provider]  amiodarone (PACERONE) 200 MG tablet TAKE 1 TABLET BY MOUTH EVERY DAY 12/19/21   [name], MD  azaTHIOprine (IMURAN) 50 MG tablet Take 2 tablets (100 mg total) by mouth daily. 09/29/22   Rice, Resa Miner, MD  bumetanide (BUMEX) 1 MG tablet Take 1 tablet (1 mg total) by mouth daily. 09/12/22   Domenic Polite, MD  calcitRIOL (ROCALTROL) 0.25 MCG capsule Take 0.25 mcg by mouth daily. 06/25/22   [provider]  collagenase (SANTYL) 250 UNIT/GM ointment Apply 1 Application topically daily. 09/28/22   Trula Slade, DPM  ELIQUIS 2.5 MG TABS tablet TAKE 1 TABLET BY MOUTH TWICE  A DAY 10/05/22   [name], MD  Ensure (ENSURE) Take 237 mLs by mouth daily as needed (for meal supplement).    [provider]  folic acid (FOLVITE) 1 MG tablet TAKE 1 TABLET BY MOUTH EVERY DAY 07/22/22   [name], MD  hydroxypropyl methylcellulose / hypromellose (ISOPTO TEARS / GONIOVISC) 2.5 % ophthalmic solution Place 1 drop into both eyes as needed for dry eyes.    [provider]  Menthol, Topical Analgesic, (ICY HOT BACK EX) Apply 1 application topically daily as needed (pain).    [provider]  Multiple Vitamin (MULTIVITAMIN) tablet Take 1 tablet by mouth daily.    [provider]  omeprazole (PRILOSEC) 40 MG capsule Take 1 capsule (40 mg total) by mouth daily as needed (for acid reflux). 10/13/22   [name], [name]  polyethylene glycol (MIRALAX / GLYCOLAX) 17 g packet Take 17 g by mouth daily as  needed for mild constipation.    [provider]  predniSONE (DELTASONE) 5 MG tablet Take 1 tablet (5 mg total) by mouth daily with breakfast. 09/29/22   Rice, Resa Miner, MD  Vericiguat (VERQUVO) 10 MG TABS Take 1 tablet (10 mg total) by mouth daily. 10/09/22   [name], MD    Current Facility-Administered Medications  Medication Dose Route Frequency Provider Last Rate Last Admin   0.9 %  sodium chloride infusion  250 mL Intravenous PRN Opyd, Ilene Qua, MD       acetaminophen (TYLENOL) tablet 650 mg  650 mg Oral Q6H PRN Opyd, Ilene Qua, MD       Or   acetaminophen (TYLENOL) suppository 650 mg  650 mg Rectal Q6H PRN Opyd, Ilene Qua, MD       amiodarone (PACERONE) tablet 200 mg  200 mg Oral Daily Sheikh, Omair Latif, DO       bumetanide (BUMEX) tablet 1 mg  1 mg Oral Daily Opyd, Ilene Qua, MD   1 mg at 10/30/22 [id number]   calcitRIOL (ROCALTROL) capsule 0.25 mcg  0.25 mcg Oral Daily Opyd, Ilene Qua, MD   0.25 mcg at [id number]   folic acid (FOLVITE) tablet 1 mg  1 mg Oral Daily Sheikh, Omair Latif, DO       multivitamin tablet 1 tablet  1 tablet Oral Daily Sheikh, Omair Latif, DO       pantoprazole (PROTONIX) EC tablet 40 mg  40 mg Oral Daily Opyd, Ilene Qua, MD   40 mg at 10/30/22 0946   potassium chloride SA (KLOR-CON M) CR tablet 40 mEq  40 mEq Oral Once [place], Omair Latif, DO       predniSONE (DELTASONE) tablet 5 mg  5 mg Oral Q breakfast Opyd, Ilene Qua, MD   5 mg at 10/30/22 0946   sodium bicarbonate tablet 650 mg  650 mg Oral Daily Sheikh, Omair Latif, DO       sodium chloride flush (NS) 0.9 % injection 3 mL  3 mL Intravenous Q12H Opyd, Ilene Qua, MD   3 mL at 10/30/22 0947   sodium chloride flush (NS) 0.9 % injection 3 mL  3 mL Intravenous Q12H Opyd, Ilene Qua, MD   3 mL at 10/30/22 0947   sodium chloride flush (NS) 0.9 % injection 3 mL  3 mL Intravenous PRN Opyd, Ilene Qua, MD       Vericiguat TABS 10 mg  1 tablet Oral Daily Opyd, Ilene Qua, MD        Allergies as  of  10/29/2022 - Review Complete 10/29/2022  Allergen Reaction Noted   Baclofen Nausea And Vomiting 08/08/2020   Penicillin g  12/06/2020   Penicillins Rash 08/08/2020    Review of Systems:    Constitutional: No weight loss, fever, chills, weakness or fatigue HEENT: Eyes: No change in vision               Ears, Nose, Throat:  No change in hearing or congestion Skin: No rash or itching Cardiovascular: No chest pain, chest pressure or palpitations   Respiratory: No SOB or cough Gastrointestinal: See HPI and otherwise negative Genitourinary: No dysuria or change in urinary frequency Neurological: No headache, dizziness or syncope Musculoskeletal: No new muscle or joint pain Hematologic: No bleeding or bruising Psychiatric: No history of depression or anxiety     Physical Exam:  Vital signs in last 24 hours: Temp:  [97.9 F (36.6 C)-98.9 F (37.2 C)] 98.5 F (36.9 C) (03/22 0836) Pulse Rate:  [85-94] 92 (03/22 0836) Resp:  [13-20] 16 (03/22 0836) BP: (90-123)/(50-95) 123/95 (03/22 0836) SpO2:  [95 %-100 %] 100 % (03/22 0836) Weight:  [47.4 kg] 47.4 kg (03/21 1619)   Last BM recorded by nurses in past 5 days No data recorded  General:   Cushingoid, elderly appearing female in no acute distress Head:  Normocephalic and atraumatic. Eyes: sclerae anicteric,conjunctive pink  Heart:  regular rate and rhythm Pulm: Clear anteriorly; no wheezing Abdomen:  Soft, Obese AB, Active bowel sounds. No tenderness . Without guarding and Without rebound, No organomegaly appreciated. Extremities:  Without edema. Msk:  Symmetrical without gross deformities. Peripheral pulses intact.  Neurologic:  Alert and  oriented x4;  No focal deficits.  Skin:   Dry and intact without significant lesions or rashes. Psychiatric:  Cooperative. Normal mood and affect.  LAB RESULTS: Recent Labs    10/29/22 1658 10/29/22 2010 10/30/22 0630  WBC 3.0* 3.2*  --   HGB 6.9* 7.6* 10.5*  HCT 22.3* 24.5* 31.8*  PLT  197 219  --    BMET Recent Labs    10/29/22 1658 10/29/22 2010 10/30/22 0630  NA 138 139 142  K 3.1* 3.3* 3.6  CL 98 101 101  CO2 28 29 29   GLUCOSE 100* 95 85  BUN 30* 30* 35*  CREATININE 2.79* 2.83* 2.82*  CALCIUM 8.6* 9.0 8.7*   LFT Recent Labs    10/29/22 2010  PROT 6.0*  ALBUMIN 2.7*  AST 27  ALT 20  ALKPHOS 64  BILITOT 0.7   PT/INR Recent Labs    10/29/22 2010  LABPROT 14.5  INR 1.1    STUDIES: DG Chest Port 1 View  Result Date: 10/29/2022 CLINICAL DATA:  Dyspnea on exertion. EXAM: PORTABLE CHEST 1 VIEW COMPARISON:  Chest radiograph dated September 07, 2018. FINDINGS: The heart is enlarged. Aorta is tortuous. Left basilar opacity with atelectasis and/or small effusion. Right lung is clear. Thoracic spondylosis. IMPRESSION: 1. Left basilar opacity with atelectasis and/or small effusion. 2. Cardiomegaly. Electronically Signed   By: Keane Police D.O.   On: 10/29/2022 23:37   ECHOCARDIOGRAM COMPLETE  Result Date: 10/29/2022    ECHOCARDIOGRAM REPORT   Patient Name:   Tracy DEMBSKI Date of Exam: 10/29/2022 Medical Rec #:  [id number]         Height:       64.0 in Accession #:    [id number]        Weight:       100.0 lb Date of Birth:  1945/09/29          BSA:          1.457 m Patient Age:    77 years          BP:           98/66 mmHg Patient Gender: F                 HR:           90 bpm. Exam Location:  Inpatient Procedure: 2D Echo, Cardiac Doppler and Color Doppler Indications:    [id number] Chronic systolic (congestive) heart failure  History:        Patient has prior history of Echocardiogram examinations.                 Cardiomyopathy; Risk Factors:Hypertension.  Sonographer:    Phineas Douglas Referring Phys: [phone] AMY D CLEGG IMPRESSIONS  1. Left ventricular ejection fraction, by estimation, is 20 to 25%. The left ventricle has severely decreased function. The left ventricle demonstrates global hypokinesis. The left ventricular internal cavity size was mildly dilated. Left  ventricular diastolic function could not be evaluated. Elevated left ventricular end-diastolic pressure.  2. Right ventricular systolic function is severely reduced. The right ventricular size is mildly enlarged. There is normal pulmonary artery systolic pressure.  3. Left atrial size was severely dilated.  4. Right atrial size was severely dilated.  5. The mitral valve is degenerative. Moderate mitral valve regurgitation. No evidence of mitral stenosis.  6. Tricuspid valve regurgitation is moderate to severe.  7. The aortic valve is calcified. Aortic valve regurgitation is mild to moderate. Aortic valve sclerosis/calcification is present, without any evidence of aortic stenosis.  8. Abdominal aorta is normal size.  9. The inferior vena cava is normal in size with greater than 50% respiratory variability, suggesting right atrial pressure of 3 mmHg. Comparison(s): A prior study was performed on 08/2022. No significant change from prior study. FINDINGS  Left Ventricle: Left ventricular ejection fraction, by estimation, is 20 to 25%. The left ventricle has severely decreased function. The left ventricle demonstrates global hypokinesis. The left ventricular internal cavity size was mildly dilated. There is no left ventricular hypertrophy. Left ventricular diastolic function could not be evaluated due to mitral regurgitation (moderate or greater). Left ventricular diastolic function could not be evaluated. Elevated left ventricular end-diastolic pressure. Right Ventricle: The right ventricular size is mildly enlarged. No increase in right ventricular wall thickness. Right ventricular systolic function is severely reduced. There is normal pulmonary artery systolic pressure. The tricuspid regurgitant velocity is 2.82 m/s, and with an assumed right atrial pressure of 3 mmHg, the estimated right ventricular systolic pressure is [id number] mmHg. Left Atrium: Left atrial size was severely dilated. Right Atrium: Right atrial size was  severely dilated. Pericardium: There is no evidence of pericardial effusion. Mitral Valve: The mitral valve is degenerative in appearance. Moderate mitral valve regurgitation. No evidence of mitral valve stenosis. Tricuspid Valve: The tricuspid valve is normal in structure. Tricuspid valve regurgitation is moderate to severe. No evidence of tricuspid stenosis. Aortic Valve: The aortic valve is calcified. Aortic valve regurgitation is mild to moderate. Aortic regurgitation PHT measures 332 msec. Aortic valve sclerosis/calcification is present, without any evidence of aortic stenosis. Pulmonic Valve: The pulmonic valve was not well visualized. Pulmonic valve regurgitation is mild. No evidence of pulmonic stenosis. Aorta: Abdominal aorta is normal size. The aortic root is normal in size and structure. Venous: The inferior vena cava is normal in size  with greater than 50% respiratory variability, suggesting right atrial pressure of 3 mmHg. IAS/Shunts: No atrial level shunt detected by color flow Doppler.  LEFT VENTRICLE PLAX 2D LVIDd:         5.90 cm      Diastology LVIDs:         5.40 cm      LV e' medial:    4.35 cm/s LV PW:         1.00 cm      LV E/e' medial:  20.2 LV IVS:        0.80 cm      LV e' lateral:   7.72 cm/s LVOT diam:     2.00 cm      LV E/e' lateral: 11.4 LV SV:         56 LV SV Index:   39 LVOT Area:     3.14 cm  LV Volumes (MOD) LV vol d, MOD A2C: 190.0 ml LV vol d, MOD A4C: 161.0 ml LV vol s, MOD A2C: 143.0 ml LV vol s, MOD A4C: 116.0 ml LV SV MOD A2C:     47.0 ml LV SV MOD A4C:     161.0 ml LV SV MOD BP:      43.5 ml RIGHT VENTRICLE             IVC RV Basal diam:  4.70 cm     IVC diam: 2.00 cm RV S prime:     13.30 cm/s TAPSE (M-mode): 2.5 cm LEFT ATRIUM             Index        RIGHT ATRIUM           Index LA diam:        2.60 cm 1.78 cm/m   RA Area:     24.00 cm LA Vol (A2C):   87.3 ml 59.91 ml/m  RA Volume:   87.10 ml  59.77 ml/m LA Vol (A4C):   62.5 ml 42.89 ml/m LA Biplane Vol: 75.6 ml  51.88 ml/m  AORTIC VALVE LVOT Vmax:   107.00 cm/s LVOT Vmean:  68.200 cm/s LVOT VTI:    0.179 m AI PHT:      332 msec  AORTA Ao Root diam: 2.90 cm Ao Asc diam:  3.10 cm MITRAL VALVE                  TRICUSPID VALVE MV Area (PHT): 6.71 cm       TR Peak grad:   31.8 mmHg MV Decel Time: 113 msec       TR Vmax:        282.00 cm/s MR Peak grad:    84.3 mmHg MR Mean grad:    45.0 mmHg    SHUNTS MR Vmax:         459.00 cm/s  Systemic VTI:  0.18 m MR Vmean:        316.0 cm/s   Systemic Diam: 2.00 cm MR PISA:         0.57 cm MR PISA Eff ROA: 4 mm MR PISA Radius:  0.30 cm MV E velocity: 87.90 cm/s MV A velocity: 160.00 cm/s MV E/A ratio:  0.55 Kardie Tobb DO Electronically signed by Berniece Salines DO Signature Date/Time: 10/29/2022/4:04:54 PM    Final      Vladimir Crofts  10/30/2022, 11:10 AM   Attending physician's note  I have taken a history, reviewed the chart and examined the patient.  I performed a substantive portion of this encounter, including complete performance of at least one of the key components, in conjunction with the APP. I agree with the APP's note, impression and recommendations.    77 year old very pleasant female with stage IV CKD, chronic CHF, A-fib on chronic anticoagulation with Eliquis  Decline in hemoglobin, worsening anemia. Any likely multifactorial in the setting of stage IV CKD, anemia of chronic disease and possible hemodilutional with CHF exacerbation Fecal Hemoccult negative According to patient she normally has brown stool, denies any melena or rectal bleeding Monitor hemoglobin and transfuse as needed   Complains of bandlike tightness in the upper abdomen, feels like she cannot breath.  Her symptoms are likely secondary to CHF exacerbation.  Will obtain upper GI series to exclude any high risk lesions or large hiatal hernia She is at significant risk for anesthesia and procedure related complications, will defer EGD or colonoscopy at this point, will likely be low yield    Advised patient to eat small frequent meals Pantoprazole 40 mg twice daily Antireflux measures  The patient was provided an opportunity to ask questions and all were answered. The patient agreed with the plan and demonstrated an understanding of the instructions.  Damaris Hippo , MD [phone]

## 2022-10-30 NOTE — Telephone Encounter (Signed)
I called patient about labs and she has already been called. She is currently moving into a room to be admitted at the hospital

## 2022-10-30 NOTE — Progress Notes (Signed)
Care started prior to midnight in the emergency room and patient was admitted early this morning after midnight by Dr. Christia Reading Opyd and I am in current agreement with his assessment and plan.  This no changes to the plan of care been made accordingly.  The patient is a 77 year old African-American female with a past medical history significant for but not limited to rheumatoid arthritis, hypertension, PAF on anticoagulant Eliquis, chronic systolic CHF, mitral regurgitation, tricuspid regurgitation and chronic anemia who was directed to come to the ED from her cardiology group when she was noted to have a hemoglobin of 6.9.  Patient has been reporting increased fatigue and exertional dyspnea for roughly a week and has had increased chronic acid reflux over the same interval with multiple episodes of nonbloody vomiting and immediately after eating.  She is also had some abdominal discomfort that she attributes to acid reflux and denies any melena or hematochezia.  Upon arrival to the ED she is found to be saturating well on room air with systolic blood pressures in the 90s or greater.  EKG showed sinus rhythm with PACs and chest x-ray notable for cardiomegaly.  Labs were notable for hemoglobin of 7.6 potassium 3.3 and a creatinine of 2.3.  She is given Lasix continuous of PRBCs given her concern for symptomatic anemia.  Currently she being admitted and treated for the following but limited to:  Acute on chronic anemia  - Hgb was 6.9 on outpatient labs and she is noted to have new leukopenia and macrocytosis   - She denies melena or hematochezia  - DDx includes occult GI bleeding, hematologic toxicity of Imuran, and myeloma (M spike noted in January 2024)  - Check anemia panel and FOBT, continue bowel rest and hold Eliquis pending FOBT, hold Imuran, check post-transfusion CBC and trend H&H   -FOBT is negative -Hemoglobin/hematocrit trend: Recent Labs  Lab 10/29/22 1658 10/29/22 2010 10/30/22 0630  10/30/22 1813  HGB 6.9* 7.6* 10.5* 10.7*  HCT 22.3* 24.5* 31.8* 31.5*  MCV 100.5* 100.8*  --   --   -GI consulted for further evaluation recommendations  Reflux and abdominal discomfort with globus sensation -Continue PPI now changed to twice daily -GI obtaining an upper GI series to exclude any high risk lesions or a large hiatal hernia -The GI team feels that she is at significant risk for anesthesia or procedure related complications are deferring EGD or colonoscopy at this point   Chronic systolic CHF  - EF was 0000000 with global HK, moderate MR, and moderate-severe TR on TTE from 10/29/22  - Appears compensated  - Give IV Lasix with blood transfusion, continue Bumex and voriciguat, monitor wt and I/Os     CKD stage IV  -Appears close to baseline  -BUN/Cr Trend: Recent Labs  Lab 10/29/22 1658 10/29/22 2010 10/30/22 0630  BUN 30* 30* 35*  CREATININE 2.79* 2.83* 2.82*  -Avoid Nephrotoxic Medications, Contrast Dyes, Hypotension and Dehydration to Ensure Adequate Renal Perfusion and will need to Renally Adjust Meds -Continue to Monitor and Trend Renal Function carefully and repeat CMP in the AM   Rheumatoid arthritis  - Continue low-dose prednisone, hold Imuran pending anemia workup    Paroxysmal atrial fibrillation  -Currently holding Eliquis pending anemia workup -QT is prolonged; plan to replace potassium, check magnesium, and repeat EKG in am prior to giving amiodarone    Hypokalemia -Patient's K+ Level Trend: Recent Labs  Lab 10/29/22 1658 10/29/22 2010 10/30/22 0630  K 3.1* 3.3* 3.6  -Replete with po  KCL  -Continue to Monitor and Replete as Necessary -Repeat CMP in the AM   Prolonged QT interval  - Replace potassium, check magnesium level, repeat EKG in am prior to amiodarone   Will continue to monitor the patient's clinical response to intervention and repeat blood work in the a.m. and follow-up on specialist recommendations

## 2022-10-30 NOTE — Progress Notes (Signed)
Heart Failure Navigator Progress Note  Assessed for Heart & Vascular TOC clinic readiness.  Patient does not meet criteria due to Advanced Heart Failure patient.   Navigator will sign off at this time.   Schon Zeiders, BSN, RN Heart Failure Nurse Navigator Secure Chat Only   

## 2022-10-30 NOTE — Progress Notes (Addendum)
1440 GI in room to evaluate pt. FOBT performed at bedside by this RN and MD. FOBT read negative by Dr. Silverio Decamp.

## 2022-10-31 DIAGNOSIS — E876 Hypokalemia: Secondary | ICD-10-CM | POA: Diagnosis present

## 2022-10-31 DIAGNOSIS — I5023 Acute on chronic systolic (congestive) heart failure: Secondary | ICD-10-CM | POA: Diagnosis present

## 2022-10-31 DIAGNOSIS — D631 Anemia in chronic kidney disease: Secondary | ICD-10-CM | POA: Diagnosis present

## 2022-10-31 DIAGNOSIS — I4892 Unspecified atrial flutter: Secondary | ICD-10-CM | POA: Diagnosis present

## 2022-10-31 DIAGNOSIS — W19XXXD Unspecified fall, subsequent encounter: Secondary | ICD-10-CM | POA: Diagnosis present

## 2022-10-31 DIAGNOSIS — I5082 Biventricular heart failure: Secondary | ICD-10-CM | POA: Diagnosis present

## 2022-10-31 DIAGNOSIS — R1010 Upper abdominal pain, unspecified: Secondary | ICD-10-CM | POA: Diagnosis not present

## 2022-10-31 DIAGNOSIS — C9000 Multiple myeloma not having achieved remission: Secondary | ICD-10-CM | POA: Diagnosis present

## 2022-10-31 DIAGNOSIS — I130 Hypertensive heart and chronic kidney disease with heart failure and stage 1 through stage 4 chronic kidney disease, or unspecified chronic kidney disease: Secondary | ICD-10-CM | POA: Diagnosis present

## 2022-10-31 DIAGNOSIS — R1011 Right upper quadrant pain: Secondary | ICD-10-CM | POA: Diagnosis not present

## 2022-10-31 DIAGNOSIS — N179 Acute kidney failure, unspecified: Secondary | ICD-10-CM | POA: Diagnosis present

## 2022-10-31 DIAGNOSIS — K219 Gastro-esophageal reflux disease without esophagitis: Secondary | ICD-10-CM | POA: Diagnosis present

## 2022-10-31 DIAGNOSIS — E8809 Other disorders of plasma-protein metabolism, not elsewhere classified: Secondary | ICD-10-CM | POA: Diagnosis present

## 2022-10-31 DIAGNOSIS — K649 Unspecified hemorrhoids: Secondary | ICD-10-CM | POA: Diagnosis not present

## 2022-10-31 DIAGNOSIS — K760 Fatty (change of) liver, not elsewhere classified: Secondary | ICD-10-CM | POA: Diagnosis present

## 2022-10-31 DIAGNOSIS — D7589 Other specified diseases of blood and blood-forming organs: Secondary | ICD-10-CM | POA: Diagnosis present

## 2022-10-31 DIAGNOSIS — I428 Other cardiomyopathies: Secondary | ICD-10-CM | POA: Diagnosis present

## 2022-10-31 DIAGNOSIS — M0579 Rheumatoid arthritis with rheumatoid factor of multiple sites without organ or systems involvement: Secondary | ICD-10-CM | POA: Diagnosis present

## 2022-10-31 DIAGNOSIS — N184 Chronic kidney disease, stage 4 (severe): Secondary | ICD-10-CM | POA: Diagnosis not present

## 2022-10-31 DIAGNOSIS — R142 Eructation: Secondary | ICD-10-CM | POA: Diagnosis not present

## 2022-10-31 DIAGNOSIS — I420 Dilated cardiomyopathy: Secondary | ICD-10-CM | POA: Diagnosis present

## 2022-10-31 DIAGNOSIS — J9811 Atelectasis: Secondary | ICD-10-CM | POA: Diagnosis present

## 2022-10-31 DIAGNOSIS — I480 Paroxysmal atrial fibrillation: Secondary | ICD-10-CM | POA: Diagnosis present

## 2022-10-31 DIAGNOSIS — R09A2 Foreign body sensation, throat: Secondary | ICD-10-CM | POA: Diagnosis present

## 2022-10-31 DIAGNOSIS — R109 Unspecified abdominal pain: Secondary | ICD-10-CM | POA: Diagnosis not present

## 2022-10-31 DIAGNOSIS — Z681 Body mass index (BMI) 19 or less, adult: Secondary | ICD-10-CM | POA: Diagnosis not present

## 2022-10-31 DIAGNOSIS — D649 Anemia, unspecified: Secondary | ICD-10-CM | POA: Diagnosis present

## 2022-10-31 DIAGNOSIS — B0229 Other postherpetic nervous system involvement: Secondary | ICD-10-CM | POA: Diagnosis present

## 2022-10-31 DIAGNOSIS — I081 Rheumatic disorders of both mitral and tricuspid valves: Secondary | ICD-10-CM | POA: Diagnosis present

## 2022-10-31 DIAGNOSIS — I43 Cardiomyopathy in diseases classified elsewhere: Secondary | ICD-10-CM | POA: Diagnosis present

## 2022-10-31 DIAGNOSIS — I5022 Chronic systolic (congestive) heart failure: Secondary | ICD-10-CM | POA: Diagnosis not present

## 2022-10-31 DIAGNOSIS — Z515 Encounter for palliative care: Secondary | ICD-10-CM | POA: Diagnosis not present

## 2022-10-31 DIAGNOSIS — D72819 Decreased white blood cell count, unspecified: Secondary | ICD-10-CM | POA: Diagnosis present

## 2022-10-31 DIAGNOSIS — E854 Organ-limited amyloidosis: Secondary | ICD-10-CM | POA: Diagnosis present

## 2022-10-31 DIAGNOSIS — I5020 Unspecified systolic (congestive) heart failure: Secondary | ICD-10-CM

## 2022-10-31 DIAGNOSIS — I482 Chronic atrial fibrillation, unspecified: Secondary | ICD-10-CM | POA: Diagnosis not present

## 2022-10-31 DIAGNOSIS — I5084 End stage heart failure: Secondary | ICD-10-CM | POA: Diagnosis present

## 2022-10-31 DIAGNOSIS — K224 Dyskinesia of esophagus: Secondary | ICD-10-CM | POA: Diagnosis present

## 2022-10-31 LAB — CBC WITH DIFFERENTIAL/PLATELET
Abs Immature Granulocytes: 0.03 10*3/uL (ref 0.00–0.07)
Basophils Absolute: 0 10*3/uL (ref 0.0–0.1)
Basophils Relative: 1 %
Eosinophils Absolute: 0.1 10*3/uL (ref 0.0–0.5)
Eosinophils Relative: 4 %
HCT: 31.9 % — ABNORMAL LOW (ref 36.0–46.0)
Hemoglobin: 10.8 g/dL — ABNORMAL LOW (ref 12.0–15.0)
Immature Granulocytes: 1 %
Lymphocytes Relative: 23 %
Lymphs Abs: 0.9 10*3/uL (ref 0.7–4.0)
MCH: 30.9 pg (ref 26.0–34.0)
MCHC: 33.9 g/dL (ref 30.0–36.0)
MCV: 91.1 fL (ref 80.0–100.0)
Monocytes Absolute: 0.1 10*3/uL (ref 0.1–1.0)
Monocytes Relative: 3 %
Neutro Abs: 2.6 10*3/uL (ref 1.7–7.7)
Neutrophils Relative %: 68 %
Platelets: 192 10*3/uL (ref 150–400)
RBC: 3.5 MIL/uL — ABNORMAL LOW (ref 3.87–5.11)
RDW: 19.3 % — ABNORMAL HIGH (ref 11.5–15.5)
WBC: 3.7 10*3/uL — ABNORMAL LOW (ref 4.0–10.5)
nRBC: 0 % (ref 0.0–0.2)

## 2022-10-31 LAB — COMPREHENSIVE METABOLIC PANEL
ALT: 18 U/L (ref 0–44)
AST: 22 U/L (ref 15–41)
Albumin: 2.4 g/dL — ABNORMAL LOW (ref 3.5–5.0)
Alkaline Phosphatase: 59 U/L (ref 38–126)
Anion gap: 10 (ref 5–15)
BUN: 33 mg/dL — ABNORMAL HIGH (ref 8–23)
CO2: 27 mmol/L (ref 22–32)
Calcium: 8.9 mg/dL (ref 8.9–10.3)
Chloride: 103 mmol/L (ref 98–111)
Creatinine, Ser: 2.81 mg/dL — ABNORMAL HIGH (ref 0.44–1.00)
GFR, Estimated: 17 mL/min — ABNORMAL LOW (ref >60)
Glucose, Bld: 99 mg/dL (ref 70–99)
Potassium: 4.1 mmol/L (ref 3.5–5.1)
Sodium: 140 mmol/L (ref 135–145)
Total Bilirubin: 0.7 mg/dL (ref 0.3–1.2)
Total Protein: 5.4 g/dL — ABNORMAL LOW (ref 6.5–8.1)

## 2022-10-31 LAB — BPAM RBC
Blood Product Expiration Date: 202404182359
Blood Product Expiration Date: 202404182359
ISSUE DATE / TIME: 202403220035
ISSUE DATE / TIME: 202403220323
Unit Type and Rh: 5100
Unit Type and Rh: 5100

## 2022-10-31 LAB — TYPE AND SCREEN
ABO/RH(D): O POS
Antibody Screen: NEGATIVE
Unit division: 0
Unit division: 0

## 2022-10-31 LAB — HEMATOCRIT: HCT: 33.4 % — ABNORMAL LOW (ref 36.0–46.0)

## 2022-10-31 LAB — HEMOGLOBIN: Hemoglobin: 10.8 g/dL — ABNORMAL LOW (ref 12.0–15.0)

## 2022-10-31 LAB — MAGNESIUM: Magnesium: 2.1 mg/dL (ref 1.7–2.4)

## 2022-10-31 LAB — PHOSPHORUS: Phosphorus: 3 mg/dL (ref 2.5–4.6)

## 2022-10-31 MED ORDER — POLYETHYLENE GLYCOL 3350 17 G PO PACK
17.0000 g | PACK | Freq: Every day | ORAL | Status: DC
  Filled 2022-10-31 (×2): qty 1

## 2022-10-31 MED ORDER — FUROSEMIDE 10 MG/ML IJ SOLN
40.0000 mg | Freq: Once | INTRAMUSCULAR | Status: AC
  Administered 2022-10-31: 40 mg via INTRAVENOUS
  Filled 2022-10-31: qty 4

## 2022-10-31 NOTE — Progress Notes (Signed)
PROGRESS NOTE    Tracy Nelson  [id number] DOB: 01/22/1946 DOA: 10/29/2022 PCP: [name], [name]   Brief Narrative:  The patient is a 77 year old African-American female with a past medical history significant for but not limited to rheumatoid arthritis, hypertension, PAF on anticoagulant Eliquis, chronic systolic CHF, mitral regurgitation, tricuspid regurgitation and chronic anemia who was directed to come to the ED from her cardiology group when she was noted to have a hemoglobin of 6.9.  Patient has been reporting increased fatigue and exertional dyspnea for roughly a week and has had increased chronic acid reflux over the same interval with multiple episodes of nonbloody vomiting and immediately after eating.  She is also had some abdominal discomfort that she attributes to acid reflux and denies any melena or hematochezia.  Upon arrival to the ED she is found to be saturating well on room air with systolic blood pressures in the 90s or greater.  EKG showed sinus rhythm with PACs and chest x-ray notable for cardiomegaly.  Labs were notable for hemoglobin of 7.6 potassium 3.3 and a creatinine of 2.3.  She was transfused 2 units of PRBCs for symptomatic as well as given Lasix.  She is tolerating her liquid diet well and not having very much vomiting but still having some burping.  The gastroenterologist feels that she has a significant risk for anesthesia and procedure related complications and recommending just to monitor and they feel that her upper GI series was extremely limited but did not show any mass or lesion also.  She has some narrowing of her esophageal junction and the GI team is recommending advancing diet as tolerated if she tolerates a soft diet with no dysphagia did not recommend an EGD at this point.  GI team feels that her postprandial fullness and abdominal discomfort is likely secondary to severe CHF exacerbation given that she did appear little volume overloaded we will  give her an additional dose of Lasix today.  Assessment and Plan:  Acute on Chronic Anemia  - Hgb was 6.9 on outpatient labs and she is noted to have new leukopenia and macrocytosis   - She denies melena or hematochezia  - DDx includes occult GI bleeding, hematologic toxicity of Imuran, and myeloma (M spike noted in January 2024)  - Check anemia panel and FOBT, continue bowel rest and hold Eliquis pending FOBT, hold Imuran, check post-transfusion CBC and trend H&H   -FOBT is negative -Hemoglobin/hematocrit trend: Recent Labs  Lab 10/29/22 1658 10/29/22 2010 10/30/22 0630 10/30/22 1813 10/31/22 0551  HGB 6.9* 7.6* 10.5* 10.7* 10.8*  HCT 22.3* 24.5* 31.8* 31.5* 31.9*  MCV 100.5* 100.8*  --   --  91.1  -GI consulted for further evaluation recommendations -Get PT and OT to further evaluate and treat and do an ambulatory home O2 screen prior to discharge   Reflux and abdominal discomfort with globus sensation -Continue PPI now changed to twice daily will continue for now -GI obtaining an upper GI series to exclude any high risk lesions or a large hiatal hernia -Upper GI series done and showed "Significantly limited examination due to the patient's limited ability to reposition on the fluoroscopy table. Narrowed appearance of the distal esophagus/GE junction suspicious for the presence of a stricture at this site. Endoscopy should be considered for further evaluation. Moderate esophageal dysmotility with decreased peristalsis. Mildly tortuous thoracic esophagus.  Otherwise unremarkable upper GI series, as described." -The GI team feels that she is at significant risk for anesthesia or procedure related  complications are deferring EGD or colonoscopy at this point -Given the upper GI series I spoke with GI and they feel that her symptoms are related to CHF and they recommended continuing to significant diarrhea as tolerated and placed on a soft diet and if she tolerates this diet without  dysphagia did not recommend any EGD at this point   Acute on Chronic Systolic CHF  - EF was 0000000 with global HK, moderate MR, and moderate-severe TR on TTE from 10/29/22  - Appears a little volume overloaded - Give IV Lasix with blood transfusion, continue Bumex and voriciguat, monitor wt and I/Os   -Will give a dose of IV Lasix 40 mg x 1 today -Strict I's and O's and daily weights  Intake/Output Summary (Last 24 hours) at 10/31/2022 1811 Last data filed at 10/31/2022 1500 Gross per 24 hour  Intake 200 ml  Output 200 ml  Net 0 ml  -Need to monitor for signs and symptoms of volume overload and repeat chest x-ray in a.m.   CKD stage IV  -Appears close to baseline  -BUN/Cr Trend: Recent Labs  Lab 10/29/22 1658 10/29/22 2010 10/30/22 0630 10/31/22 0551  BUN 30* 30* 35* 33*  CREATININE 2.79* 2.83* 2.82* 2.81*  -Avoid Nephrotoxic Medications, Contrast Dyes, Hypotension and Dehydration to Ensure Adequate Renal Perfusion and will need to Renally Adjust Meds -Continue to Monitor and Trend Renal Function carefully and repeat CMP in the AM    Rheumatoid Arthritis  - Continue low-dose prednisone, hold Imuran pending anemia workup    Paroxysmal atrial fibrillation  -Currently holding Eliquis pending anemia workup and will need to continue to monitor carefully -QT is prolonged; plan to replace potassium, check magnesium, and repeat EKG in the AM   Hypokalemia -Patient's K+ Level Trend: Recent Labs  Lab 10/29/22 1658 10/29/22 2010 10/30/22 0630 10/31/22 0551  K 3.1* 3.3* 3.6 4.1  -Replete with po KCL  -Continue to Monitor and Replete as Necessary -Repeat CMP in the AM    Prolonged QT interval  - Replace potassium, check magnesium level, repeat EKG in am last QTc was 495 -Continue with Amiodarone  Hypoalbuminemia -Patient's Albumin Trend: Recent Labs  Lab 10/29/22 1658 10/29/22 2010 10/31/22 0551  ALBUMIN 2.5* 2.7* 2.4*  -Continue to Monitor and Trend and repeat CMP in  the AM  DVT prophylaxis: SCDs Start: 10/30/22 0148    Code Status: Full Code Family Communication: No family currently at bedside  Disposition Plan:  Level of care: Telemetry Medical Status is: Inpatient Remains inpatient appropriate because: His further clinical improvement and clearance by GI.  Will give additional diuresis now given her volume overload and may need cardiac involvement   Consultants:  Gastroenterology  Procedures:  As delineated as above  Antimicrobials:  Anti-infectives (From admission, onward)    None       Subjective: Seen and examined at bedside and the patient was doing okay but had not been gotten out of bed or ambulating.  Continues to complain of some abdominal tightness and some reflux symptoms including burping but no nausea or vomiting.  Denies any other concerns or complaints at this time.  Objective: Vitals:   10/31/22 0758 10/31/22 1237 10/31/22 1522 10/31/22 1544  BP: 102/75 110/87 95/71   Pulse: 82 95 89   Resp: 12 (!) 21 (!) 22 18  Temp: 97.7 F (36.5 C) 98.1 F (36.7 C) 98.1 F (36.7 C)   TempSrc: Oral Oral Oral   SpO2: 94% 98% 96%   Weight:  Intake/Output Summary (Last 24 hours) at 10/31/2022 1806 Last data filed at 10/31/2022 1500 Gross per 24 hour  Intake 200 ml  Output 200 ml  Net 0 ml   Filed Weights   10/31/22 0600  Weight: 49 kg   Examination: Physical Exam:  Constitutional: WN/WD African-American female in no acute distress appears calm Respiratory: Diminished to auscultation bilaterally, no wheezing, rales, rhonchi or crackles. Normal respiratory effort and patient is not tachypenic. No accessory muscle use.  Unlabored breathing with examined sitting in bed. Cardiovascular: RRR, no murmurs / rubs / gallops. S1 and S2 auscultated.  Mild lower extremity edema Abdomen: Soft, non-tender, non-distended. Bowel sounds positive.  GU: Deferred. Musculoskeletal: No clubbing / cyanosis of digits/nails. No joint  deformity upper and lower extremities.  Skin: No rashes, lesions, ulcers limited skin evaluation. No induration; Warm and dry.  Neurologic: CN 2-12 grossly intact with no focal deficits.  Romberg sign and cerebellar reflexes not assessed.  Psychiatric: Normal judgment and insight. Alert and oriented x 3. Normal mood and appropriate affect.   Data Reviewed: I have personally reviewed following labs and imaging studies  CBC: Recent Labs  Lab 10/29/22 1658 10/29/22 2010 10/30/22 0630 10/30/22 1813 10/31/22 0551  WBC 3.0* 3.2*  --   --  3.7*  NEUTROABS  --  2.1  --   --  2.6  HGB 6.9* 7.6* 10.5* 10.7* 10.8*  HCT 22.3* 24.5* 31.8* 31.5* 31.9*  MCV 100.5* 100.8*  --   --  91.1  PLT 197 219  --   --  [id number]   Basic Metabolic Panel: Recent Labs  Lab 10/29/22 1658 10/29/22 2010 10/30/22 0630 10/31/22 0551  NA 138 139 142 140  K 3.1* 3.3* 3.6 4.1  CL 98 101 101 103  CO2 28 29 29 27   GLUCOSE 100* 95 85 99  BUN 30* 30* 35* 33*  CREATININE 2.79* 2.83* 2.82* 2.81*  CALCIUM 8.6* 9.0 8.7* 8.9  MG  --   --  2.0 2.1  PHOS  --   --   --  3.0   GFR: Estimated Creatinine Clearance: 13 mL/min (A) (by C-G formula based on SCr of 2.81 mg/dL (H)). Liver Function Tests: Recent Labs  Lab 10/29/22 1658 10/29/22 2010 10/31/22 0551  AST 22 27 22   ALT 18 20 18   ALKPHOS 57 64 59  BILITOT 0.7 0.7 0.7  PROT 5.7* 6.0* 5.4*  ALBUMIN 2.5* 2.7* 2.4*   No results for input(s): "LIPASE", "AMYLASE" in the last 168 hours. No results for input(s): "AMMONIA" in the last 168 hours. Coagulation Profile: Recent Labs  Lab 10/29/22 2010  INR 1.1   Cardiac Enzymes: No results for input(s): "CKTOTAL", "CKMB", "CKMBINDEX", "TROPONINI" in the last 168 hours. BNP (last 3 results) No results for input(s): "PROBNP" in the last 8760 hours. HbA1C: No results for input(s): "HGBA1C" in the last 72 hours. CBG: No results for input(s): "GLUCAP" in the last 168 hours. Lipid Profile: No results for input(s):  "CHOL", "HDL", "LDLCALC", "TRIG", "CHOLHDL", "LDLDIRECT" in the last 72 hours. Thyroid Function Tests: Recent Labs    10/29/22 1658  TSH 2.239   Anemia Panel: Recent Labs    10/30/22 0028  VITAMINB12 332  FOLATE 38.5  FERRITIN 1,322*  TIBC 192*  IRON 48  RETICCTPCT 0.7   Sepsis Labs: No results for input(s): "PROCALCITON", "LATICACIDVEN" in the last 168 hours.  No results found for this or any previous visit (from the past 240 hour(s)).   Radiology Studies: DG UGI  W SINGLE CM (SOL OR THIN BA)  Result Date: 10/30/2022 CLINICAL DATA:  Provided history: Nausea. Abdominal pain. Additional history provided: Gastroesophageal reflux disease with intermittent dysphagia, globus sensation. EXAM: DG UGI W SINGLE CM TECHNIQUE: A single contrast examination was performed using thin liquid barium. The exam was performed by Narda Rutherford, NP, and was supervised and interpreted by Dr. Kellie Simmering. FLUOROSCOPY: Fluoroscopy time: 2 minutes, 48 seconds (44.00 mGy). COMPARISON:  Abdominal radiograph 08/29/2020. FINDINGS: Significantly limited examination due to the patient's limited ability to reposition on the fluoroscopy table. Mildly tortuous thoracic esophagus. Narrowed appearance of the distal esophagus/GE junction. Esophagus normal in caliber and smooth in contour elsewhere. Moderate esophageal dysmotility with decreased peristalsis. No appreciable hiatal hernia. No gastroesophageal reflux was observed. Within described limitations (and within the limitation of a single contrast examination), unremarkable appearance of the stomach, duodenal bulb and duodenal sweep. IMPRESSION: 1. Significantly limited examination due to the patient's limited ability to reposition on the fluoroscopy table. 2. Narrowed appearance of the distal esophagus/GE junction suspicious for the presence of a stricture at this site. Endoscopy should be considered for further evaluation. 3. Moderate esophageal dysmotility with decreased  peristalsis. 4. Mildly tortuous thoracic esophagus. 5. Otherwise unremarkable upper GI series, as described. Electronically Signed   By: Kellie Simmering D.O.   On: 10/30/2022 17:32   DG Chest Port 1 View  Result Date: 10/29/2022 CLINICAL DATA:  Dyspnea on exertion. EXAM: PORTABLE CHEST 1 VIEW COMPARISON:  Chest radiograph dated September 07, 2018. FINDINGS: The heart is enlarged. Aorta is tortuous. Left basilar opacity with atelectasis and/or small effusion. Right lung is clear. Thoracic spondylosis. IMPRESSION: 1. Left basilar opacity with atelectasis and/or small effusion. 2. Cardiomegaly. Electronically Signed   By: Keane Police D.O.   On: 10/29/2022 23:37    Scheduled Meds:  amiodarone  200 mg Oral Daily   bumetanide  1 mg Oral Daily   calcitRIOL  0.25 mcg Oral Daily   folic acid  1 mg Oral Daily   multivitamin with minerals  1 tablet Oral Daily   pantoprazole (PROTONIX) IV  40 mg Intravenous Q12H   polyethylene glycol  17 g Oral Daily   predniSONE  5 mg Oral Q breakfast   sodium bicarbonate  650 mg Oral Daily   sodium chloride flush  3 mL Intravenous Q12H   sodium chloride flush  3 mL Intravenous Q12H   Vericiguat  1 tablet Oral Daily   Continuous Infusions:  sodium chloride      LOS: 0 days   Raiford Noble, DO [facility] Available via Epic secure chat 7am-7pm After these hours, please refer to coverage provider listed on [email or web address] 10/31/2022, 6:06 PM

## 2022-10-31 NOTE — Progress Notes (Signed)
[place] [facility] ROUNDING NOTE   Subjective: She is tolerating liquid diet well, continues to have sensation of tight belt around the upper abdomen   Objective: Vital signs in last 24 hours: Temp:  [97.7 F (36.5 C)-98.1 F (36.7 C)] 97.7 F (36.5 C) (03/23 0758) Pulse Rate:  [80-100] 82 (03/23 0758) Resp:  [12-24] 12 (03/23 0758) BP: (102-117)/(75-91) 102/75 (03/23 0758) SpO2:  [94 %-99 %] 94 % (03/23 0758) Weight:  [49 kg] 49 kg (03/23 0600) Last BM Date : 10/30/22 General: NAD   Intake/Output from previous day: 03/22 0701 - 03/23 0700 In: -  Out: 550 [Urine:550] Intake/Output this shift: No intake/output data recorded.   Lab Results: Recent Labs    10/29/22 1658 10/29/22 2010 10/30/22 0630 10/30/22 1813 10/31/22 0551  WBC 3.0* 3.2*  --   --  3.7*  HGB 6.9* 7.6* 10.5* 10.7* 10.8*  PLT 197 219  --   --  192  MCV 100.5* 100.8*  --   --  91.1   BMET Recent Labs    10/29/22 2010 10/30/22 0630 10/31/22 0551  NA 139 142 140  K 3.3* 3.6 4.1  CL 101 101 103  CO2 29 29 27   GLUCOSE 95 85 99  BUN 30* 35* 33*  CREATININE 2.83* 2.82* 2.81*  CALCIUM 9.0 8.7* 8.9   LFT Recent Labs    10/29/22 1658 10/29/22 2010 10/31/22 0551  PROT 5.7* 6.0* 5.4*  ALBUMIN 2.5* 2.7* 2.4*  AST 22 27 22   ALT 18 20 18   ALKPHOS 57 64 59  BILITOT 0.7 0.7 0.7   PT/INR Recent Labs    10/29/22 2010  INR 1.1      Imaging/Other results: DG UGI W SINGLE CM (SOL OR THIN BA)  Result Date: 10/30/2022 CLINICAL DATA:  Provided history: Nausea. Abdominal pain. Additional history provided: Gastroesophageal reflux disease with intermittent dysphagia, globus sensation. EXAM: DG UGI W SINGLE CM TECHNIQUE: A single contrast examination was performed using thin liquid barium. The exam was performed by Narda Rutherford, NP, and was supervised and interpreted by Dr. Kellie Simmering. FLUOROSCOPY: Fluoroscopy time: 2 minutes, 48 seconds (44.00 mGy). COMPARISON:  Abdominal radiograph  08/29/2020. FINDINGS: Significantly limited examination due to the patient's limited ability to reposition on the fluoroscopy table. Mildly tortuous thoracic esophagus. Narrowed appearance of the distal esophagus/GE junction. Esophagus normal in caliber and smooth in contour elsewhere. Moderate esophageal dysmotility with decreased peristalsis. No appreciable hiatal hernia. No gastroesophageal reflux was observed. Within described limitations (and within the limitation of a single contrast examination), unremarkable appearance of the stomach, duodenal bulb and duodenal sweep. IMPRESSION: 1. Significantly limited examination due to the patient's limited ability to reposition on the fluoroscopy table. 2. Narrowed appearance of the distal esophagus/GE junction suspicious for the presence of a stricture at this site. Endoscopy should be considered for further evaluation. 3. Moderate esophageal dysmotility with decreased peristalsis. 4. Mildly tortuous thoracic esophagus. 5. Otherwise unremarkable upper GI series, as described. Electronically Signed   By: Kellie Simmering D.O.   On: 10/30/2022 17:32   DG Chest Port 1 View  Result Date: 10/29/2022 CLINICAL DATA:  Dyspnea on exertion. EXAM: PORTABLE CHEST 1 VIEW COMPARISON:  Chest radiograph dated September 07, 2018. FINDINGS: The heart is enlarged. Aorta is tortuous. Left basilar opacity with atelectasis and/or small effusion. Right lung is clear. Thoracic spondylosis. IMPRESSION: 1. Left basilar opacity with atelectasis and/or small effusion. 2. Cardiomegaly. Electronically Signed   By: Keane Police D.O.   On: 10/29/2022 23:37  ECHOCARDIOGRAM COMPLETE  Result Date: 10/29/2022    ECHOCARDIOGRAM REPORT   Patient Name:   Tracy Nelson Date of Exam: 10/29/2022 Medical Rec #:  [id number]         Height:       64.0 in Accession #:    [id number]        Weight:       100.0 lb Date of Birth:  12/25/1945          BSA:          1.457 m Patient Age:    77 years          BP:            98/66 mmHg Patient Gender: F                 HR:           90 bpm. Exam Location:  Inpatient Procedure: 2D Echo, Cardiac Doppler and Color Doppler Indications:    [id number] Chronic systolic (congestive) heart failure  History:        Patient has prior history of Echocardiogram examinations.                 Cardiomyopathy; Risk Factors:Hypertension.  Sonographer:    Phineas Douglas Referring Phys: [phone] AMY D CLEGG IMPRESSIONS  1. Left ventricular ejection fraction, by estimation, is 20 to 25%. The left ventricle has severely decreased function. The left ventricle demonstrates global hypokinesis. The left ventricular internal cavity size was mildly dilated. Left ventricular diastolic function could not be evaluated. Elevated left ventricular end-diastolic pressure.  2. Right ventricular systolic function is severely reduced. The right ventricular size is mildly enlarged. There is normal pulmonary artery systolic pressure.  3. Left atrial size was severely dilated.  4. Right atrial size was severely dilated.  5. The mitral valve is degenerative. Moderate mitral valve regurgitation. No evidence of mitral stenosis.  6. Tricuspid valve regurgitation is moderate to severe.  7. The aortic valve is calcified. Aortic valve regurgitation is mild to moderate. Aortic valve sclerosis/calcification is present, without any evidence of aortic stenosis.  8. Abdominal aorta is normal size.  9. The inferior vena cava is normal in size with greater than 50% respiratory variability, suggesting right atrial pressure of 3 mmHg. Comparison(s): A prior study was performed on 08/2022. No significant change from prior study. FINDINGS  Left Ventricle: Left ventricular ejection fraction, by estimation, is 20 to 25%. The left ventricle has severely decreased function. The left ventricle demonstrates global hypokinesis. The left ventricular internal cavity size was mildly dilated. There is no left ventricular hypertrophy. Left ventricular  diastolic function could not be evaluated due to mitral regurgitation (moderate or greater). Left ventricular diastolic function could not be evaluated. Elevated left ventricular end-diastolic pressure. Right Ventricle: The right ventricular size is mildly enlarged. No increase in right ventricular wall thickness. Right ventricular systolic function is severely reduced. There is normal pulmonary artery systolic pressure. The tricuspid regurgitant velocity is 2.82 m/s, and with an assumed right atrial pressure of 3 mmHg, the estimated right ventricular systolic pressure is [id number]. Left Atrium: Left atrial size was severely dilated. Right Atrium: Right atrial size was severely dilated. Pericardium: There is no evidence of pericardial effusion. Mitral Valve: The mitral valve is degenerative in appearance. Moderate mitral valve regurgitation. No evidence of mitral valve stenosis. Tricuspid Valve: The tricuspid valve is normal in structure. Tricuspid valve regurgitation is moderate to severe. No evidence of tricuspid stenosis. Aortic Valve:  The aortic valve is calcified. Aortic valve regurgitation is mild to moderate. Aortic regurgitation PHT measures 332 msec. Aortic valve sclerosis/calcification is present, without any evidence of aortic stenosis. Pulmonic Valve: The pulmonic valve was not well visualized. Pulmonic valve regurgitation is mild. No evidence of pulmonic stenosis. Aorta: Abdominal aorta is normal size. The aortic root is normal in size and structure. Venous: The inferior vena cava is normal in size with greater than 50% respiratory variability, suggesting right atrial pressure of 3 mmHg. IAS/Shunts: No atrial level shunt detected by color flow Doppler.  LEFT VENTRICLE PLAX 2D LVIDd:         5.90 cm      Diastology LVIDs:         5.40 cm      LV e' medial:    4.35 cm/s LV PW:         1.00 cm      LV E/e' medial:  20.2 LV IVS:        0.80 cm      LV e' lateral:   7.72 cm/s LVOT diam:     2.00 cm      LV  E/e' lateral: 11.4 LV SV:         56 LV SV Index:   39 LVOT Area:     3.14 cm  LV Volumes (MOD) LV vol d, MOD A2C: 190.0 ml LV vol d, MOD A4C: 161.0 ml LV vol s, MOD A2C: 143.0 ml LV vol s, MOD A4C: 116.0 ml LV SV MOD A2C:     47.0 ml LV SV MOD A4C:     161.0 ml LV SV MOD BP:      43.5 ml RIGHT VENTRICLE             IVC RV Basal diam:  4.70 cm     IVC diam: 2.00 cm RV S prime:     13.30 cm/s TAPSE (M-mode): 2.5 cm LEFT ATRIUM             Index        RIGHT ATRIUM           Index LA diam:        2.60 cm 1.78 cm/m   RA Area:     24.00 cm LA Vol (A2C):   87.3 ml 59.91 ml/m  RA Volume:   87.10 ml  59.77 ml/m LA Vol (A4C):   62.5 ml 42.89 ml/m LA Biplane Vol: 75.6 ml 51.88 ml/m  AORTIC VALVE LVOT Vmax:   107.00 cm/s LVOT Vmean:  68.200 cm/s LVOT VTI:    0.179 m AI PHT:      332 msec  AORTA Ao Root diam: 2.90 cm Ao Asc diam:  3.10 cm MITRAL VALVE                  TRICUSPID VALVE MV Area (PHT): 6.71 cm       TR Peak grad:   31.8 mmHg MV Decel Time: 113 msec       TR Vmax:        282.00 cm/s MR Peak grad:    84.3 mmHg MR Mean grad:    45.0 mmHg    SHUNTS MR Vmax:         459.00 cm/s  Systemic VTI:  0.18 m MR Vmean:        316.0 cm/s   Systemic Diam: 2.00 cm MR PISA:         0.57 cm MR PISA  Eff ROA: 4 mm MR PISA Radius:  0.30 cm MV E velocity: 87.90 cm/s MV A velocity: 160.00 cm/s MV E/A ratio:  0.55 Kardie Tobb DO Electronically signed by Berniece Salines DO Signature Date/Time: 10/29/2022/4:04:54 PM    Final       Assessment &Plan  77 year old very pleasant female with stage IV CKD, chronic CHF, A-fib on chronic anticoagulation with Eliquis with CHF exacerbation, bone marrow suppression with Imuran and myeloma    Decline in hemoglobin, worsening anemia is likely multifactorial in the setting of stage IV CKD, anemia of chronic disease, myeloma, secondary to Imuran and possible hemodilutional with CHF exacerbation Fecal Hemoccult negative  No plan for diagnostic endoscopy at this point, she is at significant  risk for anesthesia and procedure related complications Monitor hemoglobin and transfuse as needed  Upper GI series negative for mass lesion or ulcer.  Was extremely limited study.  Possible narrowing at EG junction. Advance diet as tolerated to soft diet, if she is tolerating it well with no dysphagia, do not recommend EGD at this point  Postprandial fullness and abdominal discomfort likely secondary to severe CHF, has severe global hypokinesia with EF 20 to 25% and moderate to severe TR Diuresis per heart failure team  Continue Eliquis for paroxysmal A-fib   Advised patient to eat small frequent meals Pantoprazole 40 mg twice daily Antireflux measures  GI will continue to follow along  K. Denzil Magnuson , MD [phone]  [facility]

## 2022-11-01 ENCOUNTER — Inpatient Hospital Stay (HOSPITAL_COMMUNITY): Payer: Medicare PPO

## 2022-11-01 DIAGNOSIS — I480 Paroxysmal atrial fibrillation: Secondary | ICD-10-CM | POA: Diagnosis not present

## 2022-11-01 DIAGNOSIS — K649 Unspecified hemorrhoids: Secondary | ICD-10-CM | POA: Diagnosis not present

## 2022-11-01 DIAGNOSIS — R1011 Right upper quadrant pain: Secondary | ICD-10-CM | POA: Diagnosis not present

## 2022-11-01 DIAGNOSIS — N184 Chronic kidney disease, stage 4 (severe): Secondary | ICD-10-CM | POA: Diagnosis not present

## 2022-11-01 DIAGNOSIS — I5023 Acute on chronic systolic (congestive) heart failure: Secondary | ICD-10-CM | POA: Diagnosis not present

## 2022-11-01 DIAGNOSIS — D649 Anemia, unspecified: Secondary | ICD-10-CM | POA: Diagnosis not present

## 2022-11-01 DIAGNOSIS — I5022 Chronic systolic (congestive) heart failure: Secondary | ICD-10-CM | POA: Diagnosis not present

## 2022-11-01 DIAGNOSIS — R1012 Left upper quadrant pain: Secondary | ICD-10-CM

## 2022-11-01 LAB — CBC WITH DIFFERENTIAL/PLATELET
Abs Immature Granulocytes: 0.03 10*3/uL (ref 0.00–0.07)
Basophils Absolute: 0 10*3/uL (ref 0.0–0.1)
Basophils Relative: 1 %
Eosinophils Absolute: 0.1 10*3/uL (ref 0.0–0.5)
Eosinophils Relative: 3 %
HCT: 30.3 % — ABNORMAL LOW (ref 36.0–46.0)
Hemoglobin: 10.1 g/dL — ABNORMAL LOW (ref 12.0–15.0)
Immature Granulocytes: 1 %
Lymphocytes Relative: 27 %
Lymphs Abs: 0.9 10*3/uL (ref 0.7–4.0)
MCH: 30.9 pg (ref 26.0–34.0)
MCHC: 33.3 g/dL (ref 30.0–36.0)
MCV: 92.7 fL (ref 80.0–100.0)
Monocytes Absolute: 0.2 10*3/uL (ref 0.1–1.0)
Monocytes Relative: 5 %
Neutro Abs: 2.1 10*3/uL (ref 1.7–7.7)
Neutrophils Relative %: 63 %
Platelets: 186 10*3/uL (ref 150–400)
RBC: 3.27 MIL/uL — ABNORMAL LOW (ref 3.87–5.11)
RDW: 18.8 % — ABNORMAL HIGH (ref 11.5–15.5)
WBC: 3.3 10*3/uL — ABNORMAL LOW (ref 4.0–10.5)
nRBC: 0 % (ref 0.0–0.2)

## 2022-11-01 LAB — COMPREHENSIVE METABOLIC PANEL
ALT: 17 U/L (ref 0–44)
AST: 23 U/L (ref 15–41)
Albumin: 2.2 g/dL — ABNORMAL LOW (ref 3.5–5.0)
Alkaline Phosphatase: 61 U/L (ref 38–126)
Anion gap: 7 (ref 5–15)
BUN: 37 mg/dL — ABNORMAL HIGH (ref 8–23)
CO2: 27 mmol/L (ref 22–32)
Calcium: 8.7 mg/dL — ABNORMAL LOW (ref 8.9–10.3)
Chloride: 104 mmol/L (ref 98–111)
Creatinine, Ser: 3.04 mg/dL — ABNORMAL HIGH (ref 0.44–1.00)
GFR, Estimated: 15 mL/min — ABNORMAL LOW (ref >60)
Glucose, Bld: 124 mg/dL — ABNORMAL HIGH (ref 70–99)
Potassium: 3.7 mmol/L (ref 3.5–5.1)
Sodium: 138 mmol/L (ref 135–145)
Total Bilirubin: 0.5 mg/dL (ref 0.3–1.2)
Total Protein: 5 g/dL — ABNORMAL LOW (ref 6.5–8.1)

## 2022-11-01 LAB — BRAIN NATRIURETIC PEPTIDE: B Natriuretic Peptide: 2551.3 pg/mL — ABNORMAL HIGH (ref 0.0–100.0)

## 2022-11-01 LAB — MAGNESIUM: Magnesium: 2 mg/dL (ref 1.7–2.4)

## 2022-11-01 LAB — PHOSPHORUS: Phosphorus: 2.5 mg/dL (ref 2.5–4.6)

## 2022-11-01 MED ORDER — APIXABAN 2.5 MG PO TABS
2.5000 mg | ORAL_TABLET | Freq: Two times a day (BID) | ORAL | Status: DC
  Administered 2022-11-01 – 2022-11-04 (×7): 2.5 mg via ORAL
  Filled 2022-11-01 (×7): qty 1

## 2022-11-01 MED ORDER — FUROSEMIDE 10 MG/ML IJ SOLN
20.0000 mg | Freq: Once | INTRAMUSCULAR | Status: AC
  Administered 2022-11-01: 20 mg via INTRAVENOUS
  Filled 2022-11-01: qty 2

## 2022-11-01 NOTE — Progress Notes (Signed)
PROGRESS NOTE    Tracy Nelson  [id number] DOB: 06/22/1946 DOA: 10/29/2022 PCP: [name], [name]   Brief Narrative:  The patient is a 77 year old African-American female with a past medical history significant for but not limited to rheumatoid arthritis, hypertension, PAF on anticoagulant Eliquis, chronic systolic CHF, mitral regurgitation, tricuspid regurgitation and chronic anemia who was directed to come to the ED from her cardiology group when she was noted to have a hemoglobin of 6.9.  Patient has been reporting increased fatigue and exertional dyspnea for roughly a week and has had increased chronic acid reflux over the same interval with multiple episodes of nonbloody vomiting and immediately after eating.  She is also had some abdominal discomfort that she attributes to acid reflux and denies any melena or hematochezia.  Upon arrival to the ED she is found to be saturating well on room air with systolic blood pressures in the 90s or greater.  EKG showed sinus rhythm with PACs and chest x-ray notable for cardiomegaly.  Labs were notable for hemoglobin of 7.6 potassium 3.3 and a creatinine of 2.3.  She was transfused 2 units of PRBCs for symptomatic as well as given Lasix.  She is tolerating her liquid diet well and not having very much vomiting but still having some burping.   The gastroenterologist feels that she has a significant risk for anesthesia and procedure related complications and recommending just to monitor and they feel that her upper GI series was extremely limited but did not show any mass or lesion also.  She has some narrowing of her esophageal junction and the GI team is recommending advancing diet as tolerated if she tolerates a soft diet with no dysphagia did not recommend an EGD at this point.   GI team feels that her postprandial fullness and abdominal discomfort is likely secondary to severe CHF exacerbation given that she did appear little volume overloaded we will  give her an additional dose of Lasix today.  Assessment and Plan:  Acute on Chronic Anemia  - Hgb was 6.9 on outpatient labs and she is noted to have new leukopenia and macrocytosis   - She denies melena or hematochezia  - DDx includes occult GI bleeding, hematologic toxicity of Imuran, and myeloma (M spike noted in January 2024)  - Check anemia panel and FOBT, continue bowel rest and hold Eliquis pending FOBT, hold Imuran, check post-transfusion CBC and trend H&H   -FOBT is negative -Hemoglobin/hematocrit trend: Recent Labs  Lab 10/29/22 1658 10/29/22 2010 10/30/22 0630 10/30/22 1813 10/31/22 0551 10/31/22 1920 11/01/22 0638  HGB 6.9* 7.6* 10.5* 10.7* 10.8* 10.8* 10.1*  HCT 22.3* 24.5* 31.8* 31.5* 31.9* 33.4* 30.3*  MCV 100.5* 100.8*  --   --  91.1  --  92.7  -GI consulted for further evaluation recommendations -Get PT and OT to further evaluate and treat and do an ambulatory home O2 screen prior to discharge   Reflux and abdominal discomfort with globus sensation -Continue PPI now changed to twice daily will continue for now -GI obtaining an upper GI series to exclude any high risk lesions or a large hiatal hernia -Upper GI series done and showed "Significantly limited examination due to the patient's limited ability to reposition on the fluoroscopy table. Narrowed appearance of the distal esophagus/GE junction suspicious for the presence of a stricture at this site. Endoscopy should be considered for further evaluation. Moderate esophageal dysmotility with decreased peristalsis. Mildly tortuous thoracic esophagus.  Otherwise unremarkable upper GI series, as described." -The  GI team feels that she is at significant risk for anesthesia or procedure related complications are deferring EGD or colonoscopy at this point -Given the upper GI series I spoke with GI and they feel that her symptoms are related to CHF and they recommended continuing to advance diet as tolerated and placed on a  soft diet and if she tolerates this diet without dysphagia did not recommend any EGD at this point.  Given this we have resumed her anticoagulation   Acute on Chronic Systolic CHF  -EF was 0000000 with global HK, moderate MR, and moderate-severe TR on TTE from 10/29/22  -Appears a little volume overloaded -Given IV Lasix with blood transfusion,  -Continue Butmetanide 1 mg Daily and Vericigut 10 mg po daily, monitor wt and I/Os   -Patient's BNP worsening and went from 1500.5 -> 2551.3 -Given an additional dose of IV Lasix 40 mg x 1 yesterday and will give an additional Dose of IV Lasix 20 mg x1 -Strict I's and O's and daily weights  Intake/Output Summary (Last 24 hours) at 11/01/2022 1554 Last data filed at 11/01/2022 0532 Gross per 24 hour  Intake 350 ml  Output 200 ml  Net 150 ml   -Need to monitor for signs and symptoms of volume overload and repeat chest x-ray in a.m. -Consulted cardiology for further evaluation and recommendations and they will see in the a.m.   CKD stage IV  -Appears close to baseline  -BUN/Cr Trend: Recent Labs  Lab 10/29/22 1658 10/29/22 2010 10/30/22 0630 10/31/22 0551 11/01/22 0638  BUN 30* 30* 35* 33* 37*  CREATININE 2.79* 2.83* 2.82* 2.81* 3.04*  -Avoid Nephrotoxic Medications, Contrast Dyes, Hypotension and Dehydration to Ensure Adequate Renal Perfusion and will need to Renally Adjust Meds -Continue to Monitor and Trend Renal Function carefully and repeat CMP in the AM    Rheumatoid Arthritis  - Continue low-dose prednisone, hold Imuran pending anemia workup    Paroxysmal atrial fibrillation  -Was holding Eliquis pending anemia workup and will need to continue to monitor carefully but no S/Sx of Overt Bleeding will resume Anticoagulation  -QT is prolonged; plan to replace potassium, check magnesium, and repeat EKG in the AM   Hypokalemia -Patient's K+ Level Trend: Recent Labs  Lab 10/29/22 1658 10/29/22 2010 10/30/22 0630 10/31/22 0551  11/01/22 0638  K 3.1* 3.3* 3.6 4.1 3.7  -Replete with po KCL  -Continue to Monitor and Replete as Necessary -Repeat CMP in the AM    Prolonged QT interval  - Replace potassium, check magnesium level, repeat EKG in am last QTc was 495 -Continue with Amiodarone   Hypoalbuminemia -Patient's Albumin Trend: Recent Labs  Lab 10/29/22 1658 10/29/22 2010 10/31/22 0551 11/01/22 0638  ALBUMIN 2.5* 2.7* 2.4* 2.2*  -Continue to Monitor and Trend and repeat CMP in the AM  DVT prophylaxis: apixaban (ELIQUIS) tablet 2.5 mg Start: 11/01/22 1145 SCDs Start: 10/30/22 0148 apixaban (ELIQUIS) tablet 2.5 mg    Code Status: Full Code Family Communication: No family currently at bedside  Disposition Plan:  Level of care: Telemetry Medical Status is: Inpatient Remains inpatient appropriate because: Needs further clinical improvement and we have consulted cardiology for further evaluation and given her volume overload   Consultants:  Gastroenterology Cardiology to see in the a.m.  Procedures:  As delineated as above  Antimicrobials:  Anti-infectives (From admission, onward)    None       Subjective: Seen and examined at bedside and was continued to have this abdominal discomfort and tightness.  States her nausea and vomiting is improved.  Continues to have some "burping" and no lightheadedness or dizziness.  No other concerns or complaints this time.  Objective: Vitals:   11/01/22 0856 11/01/22 0900 11/01/22 1200 11/01/22 1517  BP: 121/89  92/81 95/75  Pulse:  83 72 89  Resp:  16 18 20   Temp:   98.1 F (36.7 C) 98.3 F (36.8 C)  TempSrc:   Oral Oral  SpO2:  98% 97% 95%  Weight:        Intake/Output Summary (Last 24 hours) at 11/01/2022 1547 Last data filed at 11/01/2022 0532 Gross per 24 hour  Intake 350 ml  Output 200 ml  Net 150 ml   Filed Weights   10/31/22 0600 11/01/22 0500  Weight: 49 kg 49.1 kg   Examination: Physical Exam:  Constitutional: WN/WD  African-American female in no acute distress appears calm sitting in chair bedside Respiratory: Diminished to auscultation bilaterally with some slight crackles, no wheezing, rales, rhonchi. Normal respiratory effort and patient is not tachypenic. No accessory muscle use.  Unlabored breathing.  In the chair bedside Cardiovascular: RRR, no murmurs / rubs / gallops. S1 and S2 auscultated.  Mild lower extremity edema Abdomen: Soft, non-tender, non-distended.  Bowel sounds positive.  GU: Deferred. Musculoskeletal: No clubbing / cyanosis of digits/nails. No joint deformity upper and lower extremities.  Skin: No rashes, lesions, ulcers limited skin evaluation. No induration; Warm and dry.  Neurologic: CN 2-12 grossly intact with no focal deficits. Romberg sign cerebellar reflexes not assessed.  Psychiatric: Normal judgment and insight. Alert and oriented x 3. Normal mood and appropriate affect.   Data Reviewed: I have personally reviewed following labs and imaging studies  CBC: Recent Labs  Lab 10/29/22 1658 10/29/22 2010 10/30/22 0630 10/30/22 1813 10/31/22 0551 10/31/22 1920 11/01/22 0638  WBC 3.0* 3.2*  --   --  3.7*  --  3.3*  NEUTROABS  --  2.1  --   --  2.6  --  2.1  HGB 6.9* 7.6* 10.5* 10.7* 10.8* 10.8* 10.1*  HCT 22.3* 24.5* 31.8* 31.5* 31.9* 33.4* 30.3*  MCV 100.5* 100.8*  --   --  91.1  --  92.7  PLT 197 219  --   --  192  --  [id number]   Basic Metabolic Panel: Recent Labs  Lab 10/29/22 1658 10/29/22 2010 10/30/22 0630 10/31/22 0551 11/01/22 0638  NA 138 139 142 140 138  K 3.1* 3.3* 3.6 4.1 3.7  CL 98 101 101 103 104  CO2 28 29 29 27 27   GLUCOSE 100* 95 85 99 124*  BUN 30* 30* 35* 33* 37*  CREATININE 2.79* 2.83* 2.82* 2.81* 3.04*  CALCIUM 8.6* 9.0 8.7* 8.9 8.7*  MG  --   --  2.0 2.1 2.0  PHOS  --   --   --  3.0 2.5   GFR: Estimated Creatinine Clearance: 12 mL/min (A) (by C-G formula based on SCr of 3.04 mg/dL (H)). Liver Function Tests: Recent Labs  Lab  10/29/22 1658 10/29/22 2010 10/31/22 0551 11/01/22 0638  AST 22 27 22 23   ALT 18 20 18 17   ALKPHOS 57 64 59 61  BILITOT 0.7 0.7 0.7 0.5  PROT 5.7* 6.0* 5.4* 5.0*  ALBUMIN 2.5* 2.7* 2.4* 2.2*   No results for input(s): "LIPASE", "AMYLASE" in the last 168 hours. No results for input(s): "AMMONIA" in the last 168 hours. Coagulation Profile: Recent Labs  Lab 10/29/22 2010  INR 1.1   Cardiac Enzymes: No  results for input(s): "CKTOTAL", "CKMB", "CKMBINDEX", "TROPONINI" in the last 168 hours. BNP (last 3 results) No results for input(s): "PROBNP" in the last 8760 hours. HbA1C: No results for input(s): "HGBA1C" in the last 72 hours. CBG: No results for input(s): "GLUCAP" in the last 168 hours. Lipid Profile: No results for input(s): "CHOL", "HDL", "LDLCALC", "TRIG", "CHOLHDL", "LDLDIRECT" in the last 72 hours. Thyroid Function Tests: Recent Labs    10/29/22 1658  TSH 2.239   Anemia Panel: Recent Labs    10/30/22 0028  VITAMINB12 332  FOLATE 38.5  FERRITIN 1,322*  TIBC 192*  IRON 48  RETICCTPCT 0.7   Sepsis Labs: No results for input(s): "PROCALCITON", "LATICACIDVEN" in the last 168 hours.  No results found for this or any previous visit (from the past 240 hour(s)).   Radiology Studies: DG CHEST PORT 1 VIEW  Result Date: 11/01/2022 CLINICAL DATA:  141880 SOB (shortness of breath) 141880 EXAM: PORTABLE CHEST - 1 VIEW COMPARISON:  10/29/2022 FINDINGS: Persistent airspace disease in the left lower lung. Mild central pulmonary vascular congestion is suspected. Right lung otherwise clear. Stable cardiomegaly. Persistent blunting of the left lateral costophrenic angle with mild elevation of the left diaphragmatic leaflet. Left shoulder DJD. IMPRESSION: Persistent left lower lung airspace disease and effusion. Electronically Signed   By: Lucrezia Europe M.D.   On: 11/01/2022 08:49   DG UGI W SINGLE CM (SOL OR THIN BA)  Result Date: 10/30/2022 CLINICAL DATA:  Provided history:  Nausea. Abdominal pain. Additional history provided: Gastroesophageal reflux disease with intermittent dysphagia, globus sensation. EXAM: DG UGI W SINGLE CM TECHNIQUE: A single contrast examination was performed using thin liquid barium. The exam was performed by Narda Rutherford, NP, and was supervised and interpreted by Dr. Kellie Simmering. FLUOROSCOPY: Fluoroscopy time: 2 minutes, 48 seconds (44.00 mGy). COMPARISON:  Abdominal radiograph 08/29/2020. FINDINGS: Significantly limited examination due to the patient's limited ability to reposition on the fluoroscopy table. Mildly tortuous thoracic esophagus. Narrowed appearance of the distal esophagus/GE junction. Esophagus normal in caliber and smooth in contour elsewhere. Moderate esophageal dysmotility with decreased peristalsis. No appreciable hiatal hernia. No gastroesophageal reflux was observed. Within described limitations (and within the limitation of a single contrast examination), unremarkable appearance of the stomach, duodenal bulb and duodenal sweep. IMPRESSION: 1. Significantly limited examination due to the patient's limited ability to reposition on the fluoroscopy table. 2. Narrowed appearance of the distal esophagus/GE junction suspicious for the presence of a stricture at this site. Endoscopy should be considered for further evaluation. 3. Moderate esophageal dysmotility with decreased peristalsis. 4. Mildly tortuous thoracic esophagus. 5. Otherwise unremarkable upper GI series, as described. Electronically Signed   By: Kellie Simmering D.O.   On: 10/30/2022 17:32    Scheduled Meds:  amiodarone  200 mg Oral Daily   apixaban  2.5 mg Oral BID   bumetanide  1 mg Oral Daily   calcitRIOL  0.25 mcg Oral Daily   folic acid  1 mg Oral Daily   multivitamin with minerals  1 tablet Oral Daily   pantoprazole (PROTONIX) IV  40 mg Intravenous Q12H   polyethylene glycol  17 g Oral Daily   predniSONE  5 mg Oral Q breakfast   sodium bicarbonate  650 mg Oral Daily    sodium chloride flush  3 mL Intravenous Q12H   sodium chloride flush  3 mL Intravenous Q12H   Vericiguat  1 tablet Oral Daily   Continuous Infusions:  sodium chloride      LOS: 1 day  Raiford Noble, DO [facility] Available via Epic secure chat 7am-7pm After these hours, please refer to coverage provider listed on [email or web address] 11/01/2022, 3:47 PM

## 2022-11-01 NOTE — Progress Notes (Addendum)
[facility] ROUNDING NOTE   Subjective: Patient sitting up in bedside chair no family present. Patient was eating eggs with potatoes. States has been tolerating soft diet well as long as she chews well. Denies any choking on her food or severe dysphagia. She continues to have some burping but states this is improved slightly. No nausea, vomiting or regurgitation.   Objective: Vital signs in last 24 hours: Temp:  [98 F (36.7 C)-98.4 F (36.9 C)] 98.4 F (36.9 C) (03/24 0800) Pulse Rate:  [79-95] 83 (03/24 0900) Resp:  [15-22] 16 (03/24 0900) BP: (89-121)/(64-89) 121/89 (03/24 0856) SpO2:  [96 %-99 %] 98 % (03/24 0900) Weight:  [49.1 kg] 49.1 kg (03/24 0500) Last BM Date : 10/31/22 General: NAD Heart:  regular rate and rhythm Pulm: Clear anteriorly; no wheezing Abdomen:  Soft, Obese AB, Active bowel sounds. No tenderness . Without guarding and Without rebound, No organomegaly appreciated.  Intake/Output from previous day: 03/23 0701 - 03/24 0700 In: 550 [P.O.:550] Out: 200 [Urine:200] Intake/Output this shift: No intake/output data recorded.   Lab Results: Recent Labs    10/29/22 2010 10/30/22 0630 10/31/22 0551 10/31/22 1920 11/01/22 0638  WBC 3.2*  --  3.7*  --  3.3*  HGB 7.6*   < > 10.8* 10.8* 10.1*  PLT 219  --  192  --  186  MCV 100.8*  --  91.1  --  92.7   < > = values in this interval not displayed.   BMET Recent Labs    10/30/22 0630 10/31/22 0551 11/01/22 0638  NA 142 140 138  K 3.6 4.1 3.7  CL 101 103 104  CO2 29 27 27   GLUCOSE 85 99 124*  BUN 35* 33* 37*  CREATININE 2.82* 2.81* 3.04*  CALCIUM 8.7* 8.9 8.7*   LFT Recent Labs    10/29/22 2010 10/31/22 0551 11/01/22 0638  PROT 6.0* 5.4* 5.0*  ALBUMIN 2.7* 2.4* 2.2*  AST 27 22 23   ALT 20 18 17   ALKPHOS 64 59 61  BILITOT 0.7 0.7 0.5   PT/INR Recent Labs    10/29/22 2010  INR 1.1      Imaging/Other results: DG CHEST PORT 1 VIEW  Result Date:  11/01/2022 CLINICAL DATA:  141880 SOB (shortness of breath) 141880 EXAM: PORTABLE CHEST - 1 VIEW COMPARISON:  10/29/2022 FINDINGS: Persistent airspace disease in the left lower lung. Mild central pulmonary vascular congestion is suspected. Right lung otherwise clear. Stable cardiomegaly. Persistent blunting of the left lateral costophrenic angle with mild elevation of the left diaphragmatic leaflet. Left shoulder DJD. IMPRESSION: Persistent left lower lung airspace disease and effusion. Electronically Signed   By: Lucrezia Europe M.D.   On: 11/01/2022 08:49   DG UGI W SINGLE CM (SOL OR THIN BA)  Result Date: 10/30/2022 CLINICAL DATA:  Provided history: Nausea. Abdominal pain. Additional history provided: Gastroesophageal reflux disease with intermittent dysphagia, globus sensation. EXAM: DG UGI W SINGLE CM TECHNIQUE: A single contrast examination was performed using thin liquid barium. The exam was performed by Narda Rutherford, NP, and was supervised and interpreted by Dr. Kellie Simmering. FLUOROSCOPY: Fluoroscopy time: 2 minutes, 48 seconds (44.00 mGy). COMPARISON:  Abdominal radiograph 08/29/2020. FINDINGS: Significantly limited examination due to the patient's limited ability to reposition on the fluoroscopy table. Mildly tortuous thoracic esophagus. Narrowed appearance of the distal esophagus/GE junction. Esophagus normal in caliber and smooth in contour elsewhere. Moderate esophageal dysmotility with decreased peristalsis. No appreciable hiatal hernia. No gastroesophageal reflux was observed. Within described limitations (and  within the limitation of a single contrast examination), unremarkable appearance of the stomach, duodenal bulb and duodenal sweep. IMPRESSION: 1. Significantly limited examination due to the patient's limited ability to reposition on the fluoroscopy table. 2. Narrowed appearance of the distal esophagus/GE junction suspicious for the presence of a stricture at this site. Endoscopy should be  considered for further evaluation. 3. Moderate esophageal dysmotility with decreased peristalsis. 4. Mildly tortuous thoracic esophagus. 5. Otherwise unremarkable upper GI series, as described. Electronically Signed   By: Kellie Simmering D.O.   On: 10/30/2022 17:32    Patient profile: 77 year old very pleasant female with stage IV CKD, chronic CHF, A-fib on chronic anticoagulation with Eliquis with CHF exacerbation, bone marrow suppression with Imuran and myeloma presents with acute on chronic anemia and dysphagia.  Assessment &Plan  Acute on chronic anemia in setting of CKD stage IV and chronic systolic heart failure on Eliquis Recent Labs    09/09/22 0512 09/10/22 0537 09/11/22 0522 10/29/22 1658 10/29/22 2010 10/30/22 0630 10/30/22 1813 10/31/22 0551 10/31/22 1920 11/01/22 0638  HGB 8.7* 8.7* 8.3* 6.9* 7.6* 10.5* 10.7* 10.8* 10.8* 10.1*  Hemoglobin currently stable at 10.1, no signs of overt GI bleeding, Hemoccult negative. Most likely multifactorial with CKD, anemia of chronic disease, myeloma, hemodilution with CHF exacerbation  GERD with intermittent dysphagia globulus sensation Upper GI series negative for mass lesion or ulcer.  Was extremely limited study.  Possible narrowing at EG junction. Patient tolerating soft diet at this time and declines endoscopic evaluation. Continue soft diet, chewing well, sitting up for 90 minutes after eating, liquids while eating Continue on PPI BID PO outpatient Discussed patient has any severe dysphagia symptoms or feeling of impaction to go to the ER.   Chronic systolic heart failure Echo yesterday EF 20-25% BNP on admission over 3000 Continue to diurese, help with euvolemia   CKD stage IV Likely contributing to anemia   Paroxysmal atrial fibrillation and QT prolongation On Eliquis   Rheumatoid arthritis Potentially be contributing to dysmotility   [facility] will sign off.  Please contact us if we can be of any further assistance  during this hospital stay.   Attending physician's note   I have taken a history, reviewed the chart and examined the patient. I performed a substantive portion of this encounter, including complete performance of at least one of the key components, in conjunction with the APP. I agree with the APP's note, impression and recommendations.   She is tolerating soft diet, continues to have bandlike sensation in the upper abdomen though slightly better s/p diuresis Will hold off EGD, will be low yield Continue antireflux measures Small frequent low residue soft diet meals with low-fat diet Continue pantoprazole 40 mg twice daily before breakfast and dinner  Severe CHF exacerbation, EF 20 to 25%: Continue diuresis Paroxysmal A-fib: Restart Eliquis  Hemoglobin stable during this hospitalization, Hemoccult negative Anemia is multifactorial (myeloma, stage IV CKD, anemia of chronic disease)  GI signing off but available if have any questions    The patient was provided an opportunity to ask questions and all were answered. The patient agreed with the plan and demonstrated an understanding of the instructions.   Damaris Hippo , MD [phone]

## 2022-11-01 NOTE — Evaluation (Signed)
Physical Therapy Evaluation Patient Details Name: Tracy Nelson MRN: [id number] DOB: Oct 17, 1945 Today's Date: 11/01/2022  History of Present Illness  Pt is a 77 y.o. female who presented 10/29/22 to the ED after noted to have hemoglobin level of 6.9 on outpatient labs. Upper GI series negative for mass lesion or ulcer, but possible narrowing at EG junction. Suspect her worsening anemia is likely multifactorial in the setting of stage IV CKD, anemia of chronic disease, myeloma, secondary to Imuran and possible hemodilutional with CHF exacerbation. PMH includes chronic systolic CHF, mitral regurgitation, tricuspid regurgitation, chronic anemia, PAF on Eliquis, nonischemic cardiomyopathy, RA, CKD IV, recent IM nail hip 07/10/22, osteoporosis, HTN.   Clinical Impression  Pt presents with condition above and deficits mentioned below, see PT Problem List. PTA, she was mod I using her rollator for functional mobility, living with her daughter and her daughter's family in a 2-level house with a level entry. Pt does not need to go upstairs. Currently, pt demonstrates deficits in strength, power, balance, and activity tolerance. She was able to transfer to stand and ambulate with a RW at a min guard assist level. As pt is at risk for falls and demonstrates the deficits above, recommending follow-up with HHPT. Will continue to follow acutely.    SpO2 in 90s% on RA throughout  BP:  92/64 (75) sitting 98/73 (83) standing 100/70 (80) sitting after ambulating     Recommendations for follow up therapy are one component of a multi-disciplinary discharge planning process, led by the attending physician.  Recommendations may be updated based on patient status, additional functional criteria and insurance authorization.  Follow Up Recommendations Home health PT      Assistance Recommended at Discharge Intermittent Supervision/Assistance  Patient can return home with the following  A little help with  bathing/dressing/bathroom;Assistance with cooking/housework;Direct supervision/assist for medications management;Direct supervision/assist for financial management;Assist for transportation    Equipment Recommendations None recommended by PT (has all needed DME)  Recommendations for Other Services       Functional Status Assessment Patient has had a recent decline in their functional status and demonstrates the ability to make significant improvements in function in a reasonable and predictable amount of time.     Precautions / Restrictions Precautions Precautions: Fall Restrictions Weight Bearing Restrictions: No      Mobility  Bed Mobility               General bed mobility comments: Pt up in recliner upon arrival    Transfers Overall transfer level: Needs assistance Equipment used: Rolling walker (2 wheels) Transfers: Sit to/from Stand Sit to Stand: Min guard           General transfer comment: min guard assist for safety to transfer to stand from the recliner to a RW    Ambulation/Gait Ambulation/Gait assistance: Min guard Gait Distance (Feet): 160 Feet Assistive device: Rolling walker (2 wheels) Gait Pattern/deviations: Step-through pattern, Decreased stride length, Shuffle, Decreased dorsiflexion - left, Decreased dorsiflexion - right Gait velocity: reduced Gait velocity interpretation: <1.31 ft/sec, indicative of household ambulator   General Gait Details: Pt ambulates slowly with shuffling short steps. No LOB, min guard for safety  Stairs            Wheelchair Mobility    Modified Rankin (Stroke Patients Only)       Balance Overall balance assessment: Needs assistance Sitting-balance support: Feet supported Sitting balance-Leahy Scale: Good     Standing balance support: Bilateral upper extremity supported, Reliant on assistive device  for balance Standing balance-Leahy Scale: Fair Standing balance comment: Reliant on RW to ambulate                              Pertinent Vitals/Pain Pain Assessment Pain Assessment: Faces Faces Pain Scale: Hurts a little bit Pain Location: L hip Pain Descriptors / Indicators: Discomfort, Guarding Pain Intervention(s): Monitored during session, Limited activity within patient's tolerance, Repositioned    Home Living Family/patient expects to be discharged to:: Private residence Living Arrangements: Children (daughter and daughter's family) Available Help at Discharge: Family;Available PRN/intermittently Type of Home: House Home Access: Level entry       Home Layout: Two level;Able to live on main level with bedroom/bathroom Home Equipment: Rolling Walker (2 wheels);Rollator (4 wheels);BSC/3in1      Prior Function Prior Level of Function : Independent/Modified Independent             Mobility Comments: uses rollator for mobility ADLs Comments: sponge bathes at baseline, reporst some assist with med mgmt, daughter provides transportation     Hand Dominance   Dominant Hand: Right    Extremity/Trunk Assessment   Upper Extremity Assessment Upper Extremity Assessment: Defer to OT evaluation    Lower Extremity Assessment Lower Extremity Assessment: Generalized weakness    Cervical / Trunk Assessment Cervical / Trunk Assessment: Normal  Communication   Communication: No difficulties  Cognition Arousal/Alertness: Awake/alert Behavior During Therapy: Flat affect Overall Cognitive Status: No family/caregiver present to determine baseline cognitive functioning                                 General Comments: noted slow processing and increased time to follow commands        General Comments General comments (skin integrity, edema, etc.): SpO2 in 90s% on RA throughout; BP 92/64 (75) sitting, 98/73 (83) standing, 100/70 (80) sitting after ambulating    Exercises     Assessment/Plan    PT Assessment Patient needs continued PT services   PT Problem List Decreased strength;Decreased activity tolerance;Decreased balance;Decreased mobility       PT Treatment Interventions DME instruction;Gait training;Functional mobility training;Therapeutic activities;Therapeutic exercise;Balance training;Neuromuscular re-education;Patient/family education    PT Goals (Current goals can be found in the Care Plan section)  Acute Rehab PT Goals Patient Stated Goal: to improve PT Goal Formulation: With patient Time For Goal Achievement: 11/15/22 Potential to Achieve Goals: Good    Frequency Min 3X/week     Co-evaluation               AM-PAC PT "6 Clicks" Mobility  Outcome Measure Help needed turning from your back to your side while in a flat bed without using bedrails?: None Help needed moving from lying on your back to sitting on the side of a flat bed without using bedrails?: A Little Help needed moving to and from a bed to a chair (including a wheelchair)?: A Little Help needed standing up from a chair using your arms (e.g., wheelchair or bedside chair)?: A Little Help needed to walk in hospital room?: A Little Help needed climbing 3-5 steps with a railing? : A Little 6 Click Score: 19    End of Session Equipment Utilized During Treatment: Gait belt Activity Tolerance: Patient tolerated treatment well Patient left: in chair;with call bell/phone within reach;with chair alarm set   PT Visit Diagnosis: Unsteadiness on feet (R26.81);Other abnormalities of gait and mobility (  R26.89);Muscle weakness (generalized) (M62.81)    Time: [id number] PT Time Calculation (min) (ACUTE ONLY): 19 min   Charges:   PT Evaluation $PT Eval Moderate Complexity: 1 Mod          Moishe Spice, PT, DPT Acute Rehabilitation Services  Office: [phone]   Orvan Falconer 11/01/2022, 2:05 PM

## 2022-11-01 NOTE — Evaluation (Signed)
Occupational Therapy Evaluation Patient Details Name: Tracy Nelson MRN: [id number] DOB: 08-23-1945 Today's Date: 11/01/2022   History of Present Illness Pt is a 77 y/o F presenitng to ED on 3/21 with low hgb (6.8) on outpatient labs. GI series negative for mass lesion/ulcer. Anemia likely multifactorial in the setting of CKD IV, anemia of chronic disease, myeloma, possible hemodilutional with CHF exacerbation. PMH includes CHF, mitral regurgitation, tricuspid regurgitation, chronic anemia, PAF on Eliquis, non ischemic cardiomyopahty, RA, CKD IV, recent hip IM nail 07/2022, osteoporosis, and HTN   Clinical Impression   Pt reports independence at baseline with ADLs, sponge bathes and daughter provides transportation/assists with med mgmt. Pt uses rollator for mobility, lives with daughter and her family. Pt currently needing min -mod A for ADLs, min guard for bed mobility, and min guard for step pivot transfer to chair. Pt able to stand with RW x10 min for pericare, donning brief, and placing foam bandage. Pt presenting with impairments listed below, will follow acutely. Recommend HHOT at d/c.     Recommendations for follow up therapy are one component of a multi-disciplinary discharge planning process, led by the attending physician.  Recommendations may be updated based on patient status, additional functional criteria and insurance authorization.   Follow Up Recommendations  Home health OT     Assistance Recommended at Discharge Intermittent Supervision/Assistance  Patient can return home with the following A little help with walking and/or transfers;A little help with bathing/dressing/bathroom;Assistance with cooking/housework;Direct supervision/assist for medications management;Direct supervision/assist for financial management;Assist for transportation;Help with stairs or ramp for entrance    Functional Status Assessment  Patient has had a recent decline in their functional status and  demonstrates the ability to make significant improvements in function in a reasonable and predictable amount of time.  Equipment Recommendations  None recommended by OT (pt has all needed DME)    Recommendations for Other Services PT consult     Precautions / Restrictions Precautions Precautions: Fall Restrictions Weight Bearing Restrictions: No      Mobility Bed Mobility Overal bed mobility: Needs Assistance Bed Mobility: Supine to Sit     Supine to sit: Min guard     General bed mobility comments: increased time, cues to scoot to EOB    Transfers Overall transfer level: Needs assistance Equipment used: Rolling walker (2 wheels) Transfers: Sit to/from Stand, Bed to chair/wheelchair/BSC Sit to Stand: Min guard     Step pivot transfers: Min guard            Balance Overall balance assessment: Needs assistance Sitting-balance support: Feet supported Sitting balance-Leahy Scale: Good     Standing balance support: Bilateral upper extremity supported, Reliant on assistive device for balance Standing balance-Leahy Scale: Fair Standing balance comment: stands statically x5 min for pericare/replacing sacral foam                           ADL either performed or assessed with clinical judgement   ADL Overall ADL's : Needs assistance/impaired Eating/Feeding: Set up;Sitting   Grooming: Set up;Sitting   Upper Body Bathing: Minimal assistance   Lower Body Bathing: Moderate assistance   Upper Body Dressing : Minimal assistance   Lower Body Dressing: Moderate assistance   Toilet Transfer: Adult nurse walker (2 wheels)   Toileting- Clothing Manipulation and Hygiene: Supervision/safety;Sit to/from stand       Functional mobility during ADLs: Min guard;Rolling walker (2 wheels)       Vision  Vision Assessment?: No apparent visual deficits     Perception Perception Perception Tested?: No   Praxis  Praxis Praxis tested?: Not tested    Pertinent Vitals/Pain Pain Assessment Pain Assessment: No/denies pain     Hand Dominance Right   Extremity/Trunk Assessment Upper Extremity Assessment Upper Extremity Assessment: Generalized weakness   Lower Extremity Assessment Lower Extremity Assessment: Defer to PT evaluation   Cervical / Trunk Assessment Cervical / Trunk Assessment: Normal   Communication Communication Communication: No difficulties   Cognition Arousal/Alertness: Awake/alert Behavior During Therapy: Flat affect Overall Cognitive Status: No family/caregiver present to determine baseline cognitive functioning                                 General Comments: pt aware of why she is in the hospital , noted slow processing and increased time to follow commands     General Comments  VSS On RA    Exercises     Shoulder Instructions      Home Living Family/patient expects to be discharged to:: Private residence Living Arrangements: Children (daughter and daughter's family) Available Help at Discharge: Family;Available PRN/intermittently Type of Home: House Home Access: Level entry     Home Layout: Two level;Able to live on main level with bedroom/bathroom     Bathroom Shower/Tub: Teacher, early years/pre: Standard     Home Equipment: Conservation officer, nature (2 wheels);Rollator (4 wheels);BSC/3in1          Prior Functioning/Environment Prior Level of Function : Independent/Modified Independent             Mobility Comments: uses rollator for mobility ADLs Comments: sponge bathes at baseline, reporst some assist with med mgmt, daughter provides transportation        OT Problem List: Decreased strength;Decreased range of motion;Decreased activity tolerance;Impaired balance (sitting and/or standing);Decreased cognition;Decreased safety awareness      OT Treatment/Interventions: Self-care/ADL training;Therapeutic exercise;DME and/or  AE instruction;Energy conservation;Therapeutic activities;Patient/family education;Balance training    OT Goals(Current goals can be found in the care plan section) Acute Rehab OT Goals Patient Stated Goal: none stated OT Goal Formulation: With patient Time For Goal Achievement: 11/15/22 Potential to Achieve Goals: Good ADL Goals Pt Will Perform Upper Body Dressing: with supervision;sitting;standing Pt Will Perform Lower Body Dressing: with supervision;sitting/lateral leans;sit to/from stand Pt Will Transfer to Toilet: with supervision;ambulating;regular height toilet Pt Will Perform Tub/Shower Transfer: Shower transfer;Tub transfer;with supervision;ambulating;shower seat  OT Frequency: Min 2X/week    Co-evaluation              AM-PAC OT "6 Clicks" Daily Activity     Outcome Measure Help from another person eating meals?: None Help from another person taking care of personal grooming?: A Little Help from another person toileting, which includes using toliet, bedpan, or urinal?: A Little Help from another person bathing (including washing, rinsing, drying)?: A Lot Help from another person to put on and taking off regular upper body clothing?: A Little Help from another person to put on and taking off regular lower body clothing?: A Lot 6 Click Score: 17   End of Session Equipment Utilized During Treatment: Gait belt;Rolling walker (2 wheels) Nurse Communication: Mobility status  Activity Tolerance: Patient tolerated treatment well Patient left: in chair;with call bell/phone within reach;with chair alarm set;with nursing/sitter in room  OT Visit Diagnosis: Unsteadiness on feet (R26.81);Other abnormalities of gait and mobility (R26.89);Muscle weakness (generalized) (M62.81)  Time: 0825-0904 OT Time Calculation (min): 39 min Charges:  OT General Charges $OT Visit: 1 Visit OT Evaluation $OT Eval Moderate Complexity: 1 Mod OT Treatments $Self Care/Home Management  : 23-37 mins  Renaye Rakers, OTD, OTR/L [employer] Preferred Acute Rehab [phone]   Renaye Rakers Koonce 11/01/2022, 10:23 AM

## 2022-11-02 ENCOUNTER — Inpatient Hospital Stay (HOSPITAL_COMMUNITY): Payer: Medicare PPO

## 2022-11-02 DIAGNOSIS — I5022 Chronic systolic (congestive) heart failure: Secondary | ICD-10-CM | POA: Diagnosis not present

## 2022-11-02 DIAGNOSIS — I5023 Acute on chronic systolic (congestive) heart failure: Secondary | ICD-10-CM | POA: Diagnosis not present

## 2022-11-02 DIAGNOSIS — R142 Eructation: Secondary | ICD-10-CM

## 2022-11-02 DIAGNOSIS — D649 Anemia, unspecified: Secondary | ICD-10-CM | POA: Diagnosis not present

## 2022-11-02 DIAGNOSIS — I482 Chronic atrial fibrillation, unspecified: Secondary | ICD-10-CM | POA: Diagnosis not present

## 2022-11-02 DIAGNOSIS — I5082 Biventricular heart failure: Secondary | ICD-10-CM

## 2022-11-02 DIAGNOSIS — R109 Unspecified abdominal pain: Secondary | ICD-10-CM

## 2022-11-02 DIAGNOSIS — R1011 Right upper quadrant pain: Secondary | ICD-10-CM | POA: Diagnosis not present

## 2022-11-02 DIAGNOSIS — I480 Paroxysmal atrial fibrillation: Secondary | ICD-10-CM | POA: Diagnosis not present

## 2022-11-02 DIAGNOSIS — N184 Chronic kidney disease, stage 4 (severe): Secondary | ICD-10-CM | POA: Diagnosis not present

## 2022-11-02 LAB — COMPREHENSIVE METABOLIC PANEL
ALT: 17 U/L (ref 0–44)
AST: 22 U/L (ref 15–41)
Albumin: 2.5 g/dL — ABNORMAL LOW (ref 3.5–5.0)
Alkaline Phosphatase: 58 U/L (ref 38–126)
Anion gap: 10 (ref 5–15)
BUN: 42 mg/dL — ABNORMAL HIGH (ref 8–23)
CO2: 27 mmol/L (ref 22–32)
Calcium: 8.8 mg/dL — ABNORMAL LOW (ref 8.9–10.3)
Chloride: 103 mmol/L (ref 98–111)
Creatinine, Ser: 2.79 mg/dL — ABNORMAL HIGH (ref 0.44–1.00)
GFR, Estimated: 17 mL/min — ABNORMAL LOW (ref >60)
Glucose, Bld: 93 mg/dL (ref 70–99)
Potassium: 4 mmol/L (ref 3.5–5.1)
Sodium: 140 mmol/L (ref 135–145)
Total Bilirubin: 0.5 mg/dL (ref 0.3–1.2)
Total Protein: 5.5 g/dL — ABNORMAL LOW (ref 6.5–8.1)

## 2022-11-02 LAB — CBC WITH DIFFERENTIAL/PLATELET
Abs Immature Granulocytes: 0.05 10*3/uL (ref 0.00–0.07)
Basophils Absolute: 0 10*3/uL (ref 0.0–0.1)
Basophils Relative: 1 %
Eosinophils Absolute: 0.1 10*3/uL (ref 0.0–0.5)
Eosinophils Relative: 3 %
HCT: 32.7 % — ABNORMAL LOW (ref 36.0–46.0)
Hemoglobin: 10.8 g/dL — ABNORMAL LOW (ref 12.0–15.0)
Immature Granulocytes: 1 %
Lymphocytes Relative: 31 %
Lymphs Abs: 1.1 10*3/uL (ref 0.7–4.0)
MCH: 30.8 pg (ref 26.0–34.0)
MCHC: 33 g/dL (ref 30.0–36.0)
MCV: 93.2 fL (ref 80.0–100.0)
Monocytes Absolute: 0.2 10*3/uL (ref 0.1–1.0)
Monocytes Relative: 6 %
Neutro Abs: 2 10*3/uL (ref 1.7–7.7)
Neutrophils Relative %: 58 %
Platelets: 198 10*3/uL (ref 150–400)
RBC: 3.51 MIL/uL — ABNORMAL LOW (ref 3.87–5.11)
RDW: 18.3 % — ABNORMAL HIGH (ref 11.5–15.5)
WBC: 3.5 10*3/uL — ABNORMAL LOW (ref 4.0–10.5)
nRBC: 0 % (ref 0.0–0.2)

## 2022-11-02 LAB — BRAIN NATRIURETIC PEPTIDE: B Natriuretic Peptide: 2087.2 pg/mL — ABNORMAL HIGH (ref 0.0–100.0)

## 2022-11-02 LAB — PHOSPHORUS: Phosphorus: 2.5 mg/dL (ref 2.5–4.6)

## 2022-11-02 LAB — MAGNESIUM: Magnesium: 2.1 mg/dL (ref 1.7–2.4)

## 2022-11-02 MED ORDER — FUROSEMIDE 10 MG/ML IJ SOLN
80.0000 mg | Freq: Once | INTRAMUSCULAR | Status: AC
  Administered 2022-11-02: 80 mg via INTRAVENOUS
  Filled 2022-11-02: qty 8

## 2022-11-02 NOTE — Progress Notes (Signed)
PROGRESS NOTE    Tracy Nelson  [id number] DOB: 1945/11/04 DOA: 10/29/2022 PCP: [name], [name]   Brief Narrative:  The patient is a 77 year old African-American female with a past medical history significant for but not limited to rheumatoid arthritis, hypertension, PAF on anticoagulant Eliquis, chronic systolic CHF, mitral regurgitation, tricuspid regurgitation and chronic anemia who was directed to come to the ED from her cardiology group when she was noted to have a hemoglobin of 6.9.  Patient has been reporting increased fatigue and exertional dyspnea for roughly a week and has had increased chronic acid reflux over the same interval with multiple episodes of nonbloody vomiting and immediately after eating.  She is also had some abdominal discomfort that she attributes to acid reflux and denies any melena or hematochezia.  Upon arrival to the ED she is found to be saturating well on room air with systolic blood pressures in the 90s or greater.  EKG showed sinus rhythm with PACs and chest x-ray notable for cardiomegaly.  Labs were notable for hemoglobin of 7.6 potassium 3.3 and a creatinine of 2.3.  She was transfused 2 units of PRBCs for symptomatic as well as given Lasix.  She is tolerating her liquid diet well and not having very much vomiting but still having some burping.   The gastroenterologist feels that she has a significant risk for anesthesia and procedure related complications and recommending just to monitor and they feel that her upper GI series was extremely limited but did not show any mass or lesion also.  She has some narrowing of her esophageal junction and the GI team is recommending advancing diet as tolerated if she tolerates a soft diet with no dysphagia did not recommend an EGD at this point.   GI team feels that her postprandial fullness and abdominal discomfort is likely secondary to severe CHF exacerbation given that she did appear little volume overloaded we will  give her an additional dose of Lasix today.  Cardiology is now diuresing her with 80 mg IV Lasix and GI reevaluated and ordering a right upper quadrant ultrasound.  Assessment and Plan:  Acute on Chronic Anemia - Hgb was 6.9 on outpatient labs and she is noted to have new leukopenia and macrocytosis   - She denies melena or hematochezia  - DDx includes occult GI bleeding, hematologic toxicity of Imuran, and myeloma (M spike noted in January 2024)  - Check anemia panel and FOBT, continue bowel rest and hold Eliquis pending FOBT, hold Imuran, check post-transfusion CBC and trend H&H   -FOBT is negative -Hemoglobin/hematocrit trend: Recent Labs  Lab 10/29/22 2010 10/30/22 0630 10/30/22 1813 10/31/22 0551 10/31/22 1920 11/01/22 0638 11/02/22 1017  HGB 7.6* 10.5* 10.7* 10.8* 10.8* 10.1* 10.8*  HCT 24.5* 31.8* 31.5* 31.9* 33.4* 30.3* 32.7*  MCV 100.8*  --   --  91.1  --  92.7 93.2  -GI consulted for further evaluation recommendations -Get PT and OT to further evaluate and treat and do an ambulatory home O2 screen prior to discharge: PT feels that she will need home health at discharge   Reflux and abdominal discomfort with globus sensation and belching -Continue PPI now changed to twice daily will continue for now and is getting continued IV -GI obtaining an upper GI series to exclude any high risk lesions or a large hiatal hernia -Upper GI series done and showed "Significantly limited examination due to the patient's limited ability to reposition on the fluoroscopy table. Narrowed appearance of the distal esophagus/GE  junction suspicious for the presence of a stricture at this site. Endoscopy should be considered for further evaluation. Moderate esophageal dysmotility with decreased peristalsis. Mildly tortuous thoracic esophagus.  Otherwise unremarkable upper GI series, as described." -The GI team feels that she is at significant risk for anesthesia or procedure related complications are  deferring EGD or colonoscopy at this point -Given the upper GI series I spoke with GI and they feel that her symptoms are related to CHF and they recommended continuing to advance diet as tolerated and placed on a soft diet and if she tolerates this diet without dysphagia did not recommend any EGD at this point.  Given this we have resumed her anticoagulation -Patient continues have this abdominal discomfort and sensation and asked to speak with the GI team again given that she continues to burp -Reconsulted GI for further evaluation given that cardiology is diuresing her as below -GI is now checking an upper abdominal ultrasound to rule out ascites and feel that her intermittent belching could be secondary to acid reflux but also be seen with esophageal dysmotility and heart failure   Acute on Chronic Systolic CHF  -EF was 0000000 with global HK, moderate MR, and moderate-severe TR on TTE from 10/29/22  -Appears a little volume overloaded -Given IV Lasix with blood transfusion,  -Continue Butmetanide 1 mg Daily and Vericigut 10 mg po daily, monitor wt and I/Os   -Patient's BNP worsening and went from 1500.5 -> 2551.3 and is now 2087.2 -Given an additional dose of IV Lasix 40 mg x 1 the day before yesterday and additional Dose of IV Lasix 20 mg x1 yesterday; Cardiology giving IV Lasix 80 mg x1 Intake/Output Summary (Last 24 hours) at 11/02/2022 1801 Last data filed at 11/01/2022 1848 Gross per 24 hour  Intake --  Output 900 ml  Net -900 ml    -Need to monitor for signs and symptoms of volume overload and repeat chest x-ray in a.m. -Consulted cardiology for further evaluation and recommendations and they feel that she has no advanced options available and feel that she is end-stage cardiomyopathy is not a candidate for any advanced therapies and have signed off the case now after giving her increased diuresis. -Cardiology feels that her GDMT is continue to be limited by CKD stage IV and would not  rechallenge her SGLT2 inhibitor -Given that she has no real good options we will consult palliative care for further evaluation recommendations   CKD stage IV  -Appears close to baseline  -BUN/Cr Trend: Recent Labs  Lab 10/29/22 1658 10/29/22 2010 10/30/22 0630 10/31/22 0551 11/01/22 0638 11/02/22 1017  BUN 30* 30* 35* 33* 37* 42*  CREATININE 2.79* 2.83* 2.82* 2.81* 3.04* 2.79*  -Avoid Nephrotoxic Medications, Contrast Dyes, Hypotension and Dehydration to Ensure Adequate Renal Perfusion and will need to Renally Adjust Meds -Continue to Monitor and Trend Renal Function carefully and repeat CMP in the AM    Rheumatoid Arthritis  - Continue low-dose prednisone, hold Imuran pending anemia workup    Paroxysmal atrial fibrillation  -Was holding Eliquis pending anemia workup and will need to continue to monitor carefully but no S/Sx of Overt Bleeding will resume Anticoagulation  -QT is prolonged; plan to replace potassium, check magnesium, and repeat EKG in the AM   Hypokalemia -Patient's K+ Level Trend: Recent Labs  Lab 10/29/22 1658 10/29/22 2010 10/30/22 0630 10/31/22 0551 11/01/22 0638 11/02/22 1017  K 3.1* 3.3* 3.6 4.1 3.7 4.0  -Replete with po KCL 40 mEQ  -Continue to  Monitor and Replete as Necessary -Repeat CMP in the AM    Prolonged QT interval  -Replace potassium, check magnesium level, repeat EKG in am last QTc was 495 -Continue with Amiodarone   Hypoalbuminemia -Patient's Albumin Trend: Recent Labs  Lab 10/29/22 1658 10/29/22 2010 10/31/22 0551 11/01/22 0638 11/02/22 1017  ALBUMIN 2.5* 2.7* 2.4* 2.2* 2.5*  -Continue to Monitor and Trend and repeat CMP in the AM  DVT prophylaxis: apixaban (ELIQUIS) tablet 2.5 mg Start: 11/01/22 1145 SCDs Start: 10/30/22 0148 apixaban (ELIQUIS) tablet 2.5 mg    Code Status: Full Code Family Communication: No family currently at bedside  Disposition Plan:  Level of care: Telemetry Medical Status is: Inpatient Remains  inpatient appropriate because: Pending further clinical improvement and clearance by specialist.  GI is now ordering a right a quadrant ultrasound and cardiology given her increase diuresis and sign off the case.  Will consult palliative care medicine   Consultants:  Palliative care medicine Cardiology Gastroenterology   Procedures:  As delineated as above  Antimicrobials:  Anti-infectives (From admission, onward)    None       Subjective: Seen and examined at bedside and she is still having this abdominal tightness and states is not gotten any better or worse.  States is persisting and she continues to have belching.  No nausea or vomiting as this is improved.  No other concerns or complaints at this time.  Objective: Vitals:   11/02/22 0500 11/02/22 0800 11/02/22 1255 11/02/22 1600  BP:  102/75 95/66 (!) 86/71  Pulse:  76 83 83  Resp:  18 17 16   Temp:   98.2 F (36.8 C)   TempSrc:   Oral   SpO2:  96% 98% 95%  Weight: 49.6 kg       Intake/Output Summary (Last 24 hours) at 11/02/2022 1758 Last data filed at 11/01/2022 [id number] Gross per 24 hour  Intake --  Output 900 ml  Net -900 ml   Filed Weights   10/31/22 0600 11/01/22 0500 11/02/22 0500  Weight: 49 kg 49.1 kg 49.6 kg   Examination: Physical Exam:  Constitutional: African-American female in no acute distress sitting up in the bed still complaining of this abdominal tightness Respiratory: Diminished to auscultation bilaterally with coarse breath sounds, no wheezing, rales, rhonchi or crackles. Normal respiratory effort and patient is not tachypenic. No accessory muscle use.  Unlabored breathing Cardiovascular: RRR, no murmurs / rubs / gallops. S1 and S2 auscultated.  1+ lower extremity edema Abdomen: Soft, non-tender, non-distended. Bowel sounds positive.  GU: Deferred. Musculoskeletal: No clubbing / cyanosis of digits/nails. No joint deformity upper and lower extremities.  Skin: No rashes, lesions, ulcers on a  limited skin evaluation. No induration; Warm and dry.  Neurologic: CN 2-12 grossly intact with no focal deficits.  Romberg sign and cerebellar reflexes not assessed. Psychiatric: Normal judgment and insight. Alert and oriented x 3. Normal mood and appropriate affect.   Data Reviewed: I have personally reviewed following labs and imaging studies  CBC: Recent Labs  Lab 10/29/22 1658 10/29/22 2010 10/30/22 0630 10/30/22 1813 10/31/22 0551 10/31/22 1920 11/01/22 0638 11/02/22 1017  WBC 3.0* 3.2*  --   --  3.7*  --  3.3* 3.5*  NEUTROABS  --  2.1  --   --  2.6  --  2.1 2.0  HGB 6.9* 7.6*   < > 10.7* 10.8* 10.8* 10.1* 10.8*  HCT 22.3* 24.5*   < > 31.5* 31.9* 33.4* 30.3* 32.7*  MCV 100.5* 100.8*  --   --  91.1  --  92.7 93.2  PLT 197 219  --   --  192  --  186 198   < > = values in this interval not displayed.   Basic Metabolic Panel: Recent Labs  Lab 10/29/22 2010 10/30/22 0630 10/31/22 0551 11/01/22 0638 11/02/22 1017  NA 139 142 140 138 140  K 3.3* 3.6 4.1 3.7 4.0  CL 101 101 103 104 103  CO2 29 29 27 27 27   GLUCOSE 95 85 99 124* 93  BUN 30* 35* 33* 37* 42*  CREATININE 2.83* 2.82* 2.81* 3.04* 2.79*  CALCIUM 9.0 8.7* 8.9 8.7* 8.8*  MG  --  2.0 2.1 2.0 2.1  PHOS  --   --  3.0 2.5 2.5   GFR: Estimated Creatinine Clearance: 13.2 mL/min (A) (by C-G formula based on SCr of 2.79 mg/dL (H)). Liver Function Tests: Recent Labs  Lab 10/29/22 1658 10/29/22 2010 10/31/22 0551 11/01/22 0638 11/02/22 1017  AST 22 27 22 23 22   ALT 18 20 18 17 17   ALKPHOS 57 64 59 61 58  BILITOT 0.7 0.7 0.7 0.5 0.5  PROT 5.7* 6.0* 5.4* 5.0* 5.5*  ALBUMIN 2.5* 2.7* 2.4* 2.2* 2.5*   No results for input(s): "LIPASE", "AMYLASE" in the last 168 hours. No results for input(s): "AMMONIA" in the last 168 hours. Coagulation Profile: Recent Labs  Lab 10/29/22 2010  INR 1.1   Cardiac Enzymes: No results for input(s): "CKTOTAL", "CKMB", "CKMBINDEX", "TROPONINI" in the last 168 hours. BNP (last 3  results) No results for input(s): "PROBNP" in the last 8760 hours. HbA1C: No results for input(s): "HGBA1C" in the last 72 hours. CBG: No results for input(s): "GLUCAP" in the last 168 hours. Lipid Profile: No results for input(s): "CHOL", "HDL", "LDLCALC", "TRIG", "CHOLHDL", "LDLDIRECT" in the last 72 hours. Thyroid Function Tests: No results for input(s): "TSH", "T4TOTAL", "FREET4", "T3FREE", "THYROIDAB" in the last 72 hours. Anemia Panel: No results for input(s): "VITAMINB12", "FOLATE", "FERRITIN", "TIBC", "IRON", "RETICCTPCT" in the last 72 hours. Sepsis Labs: No results for input(s): "PROCALCITON", "LATICACIDVEN" in the last 168 hours.  No results found for this or any previous visit (from the past 240 hour(s)).   Radiology Studies: DG CHEST PORT 1 VIEW  Result Date: 11/02/2022 CLINICAL DATA:  Shortness of breath EXAM: PORTABLE CHEST 1 VIEW COMPARISON:  Chest radiograph 11/01/2022 FINDINGS: No pneumothorax. Cardiomegaly. Unchanged left-sided pleural effusion and left basilar airspace opacity. Unchanged focal opacity in the left mid lung zone, possibly atelectasis. No radiographically apparent displaced rib fractures. Visualized upper abdomen is unremarkable. Degenerative changes of the right AC and glenohumeral joints. IMPRESSION: Unchanged left-sided pleural effusion and left basilar airspace opacity. Electronically Signed   By: Marin Roberts M.D.   On: 11/02/2022 07:18   DG CHEST PORT 1 VIEW  Result Date: 11/01/2022 CLINICAL DATA:  141880 SOB (shortness of breath) 141880 EXAM: PORTABLE CHEST - 1 VIEW COMPARISON:  10/29/2022 FINDINGS: Persistent airspace disease in the left lower lung. Mild central pulmonary vascular congestion is suspected. Right lung otherwise clear. Stable cardiomegaly. Persistent blunting of the left lateral costophrenic angle with mild elevation of the left diaphragmatic leaflet. Left shoulder DJD. IMPRESSION: Persistent left lower lung airspace disease and  effusion. Electronically Signed   By: Lucrezia Europe M.D.   On: 11/01/2022 08:49    Scheduled Meds:  amiodarone  200 mg Oral Daily   apixaban  2.5 mg Oral BID   calcitRIOL  0.25 mcg Oral Daily   folic acid  1 mg Oral  Daily   multivitamin with minerals  1 tablet Oral Daily   pantoprazole (PROTONIX) IV  40 mg Intravenous Q12H   polyethylene glycol  17 g Oral Daily   predniSONE  5 mg Oral Q breakfast   sodium bicarbonate  650 mg Oral Daily   sodium chloride flush  3 mL Intravenous Q12H   sodium chloride flush  3 mL Intravenous Q12H   Vericiguat  1 tablet Oral Daily   Continuous Infusions:  sodium chloride      LOS: 2 days   Raiford Noble, DO [facility] Available via Epic secure chat 7am-7pm After these hours, please refer to coverage provider listed on [email or web address] 11/02/2022, 5:58 PM

## 2022-11-02 NOTE — Progress Notes (Signed)
[name], female   DOB: 1946/01/07, 77 y.o.   MRN: [id number]    Progress Note   Subjective  Day # 4 CC;tight band feeling around upper abd/ lower chest , intermittent belching  Patient was seen by GI earlier this admission, patient requested a follow-up due to persistence of symptoms She says she is not having any nausea at this point or gagging, has been able to tolerate p.o.'s, no dysphagia at present, still burping intermittently.  Primary concern currently is about the tight band feeling that she feels across her upper abdomen/lower chest.  She says this has been present for some time but had gotten worse prior to admission.  She does not feel that this is associated with eating necessarily or movement.  BNP today 2087.2 BUN 42/creatinine 2.79 improved WBC 3.5/hemoglobin 10.8/hematocrit 32.7  Chest x-ray today unchanged left-sided pleural effusion and left basilar airspace opacity      Objective   Vital signs in last 24 hours: Temp:  [98 F (36.7 C)-98.2 F (36.8 C)] 98.2 F (36.8 C) (03/25 1255) Pulse Rate:  [76-91] 83 (03/25 1600) Resp:  [14-20] 16 (03/25 1600) BP: (86-107)/(66-78) 86/71 (03/25 1600) SpO2:  [95 %-99 %] 95 % (03/25 1600) Weight:  [49.6 kg] 49.6 kg (03/25 0500) Last BM Date : 11/02/22 General:    Older African-American female female in NAD Heart:  Regular rate and rhythm; no murmurs Lungs: Respirations even and unlabored, lungs CTA bilaterally Abdomen:  Soft, no definite fluid wave, nontender, no palpable mass or hepatosplenomegaly normal bowel sounds. Extremities:  Without edema. Neurologic:  Alert and oriented,  grossly normal neurologically. Psych:  Cooperative. Normal mood and affect.  Intake/Output from previous day: 03/24 0701 - 03/25 0700 In: -  Out: 900 [Urine:900] Intake/Output this shift: No intake/output data recorded.  Lab Results: Recent Labs    10/31/22 0551 10/31/22 1920 11/01/22 0638 11/02/22 1017  WBC  3.7*  --  3.3* 3.5*  HGB 10.8* 10.8* 10.1* 10.8*  HCT 31.9* 33.4* 30.3* 32.7*  PLT 192  --  186 198   BMET Recent Labs    10/31/22 0551 11/01/22 0638 11/02/22 1017  NA 140 138 140  K 4.1 3.7 4.0  CL 103 104 103  CO2 27 27 27   GLUCOSE 99 124* 93  BUN 33* 37* 42*  CREATININE 2.81* 3.04* 2.79*  CALCIUM 8.9 8.7* 8.8*   LFT Recent Labs    11/02/22 1017  PROT 5.5*  ALBUMIN 2.5*  AST 22  ALT 17  ALKPHOS 58  BILITOT 0.5   PT/INR No results for input(s): "LABPROT", "INR" in the last 72 hours.  Studies/Results: DG CHEST PORT 1 VIEW  Result Date: 11/02/2022 CLINICAL DATA:  Shortness of breath EXAM: PORTABLE CHEST 1 VIEW COMPARISON:  Chest radiograph 11/01/2022 FINDINGS: No pneumothorax. Cardiomegaly. Unchanged left-sided pleural effusion and left basilar airspace opacity. Unchanged focal opacity in the left mid lung zone, possibly atelectasis. No radiographically apparent displaced rib fractures. Visualized upper abdomen is unremarkable. Degenerative changes of the right AC and glenohumeral joints. IMPRESSION: Unchanged left-sided pleural effusion and left basilar airspace opacity. Electronically Signed   By: Marin Roberts M.D.   On: 11/02/2022 07:18   DG CHEST PORT 1 VIEW  Result Date: 11/01/2022 CLINICAL DATA:  141880 SOB (shortness of breath) [id number] EXAM: PORTABLE CHEST - 1 VIEW COMPARISON:  10/29/2022 FINDINGS: Persistent airspace disease in the left lower lung. Mild central pulmonary vascular congestion is suspected. Right lung otherwise clear. Stable cardiomegaly. Persistent blunting  of the left lateral costophrenic angle with mild elevation of the left diaphragmatic leaflet. Left shoulder DJD. IMPRESSION: Persistent left lower lung airspace disease and effusion. Electronically Signed   By: Lucrezia Europe M.D.   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 Upper GI showed esophageal dysmotility somewhat tortuous, some narrowing of distal esophagus at GE junction.  Continue IV Protonix, can convert to p.o. and would continue on discharge  #4-intermittent belching-secondary to acid reflux, also seen with esophageal dysmotility and can be seen with heart failure  #5 tight bandlike sensation across lower chest upper abdomen-suspect this is secondary to her severe heart failure Will check upper abdominal ultrasound-rule out ascites etc.  #6 atrial fibrillation #7.  Rheumatoid arthritis #8 prolonged QT  Patient is asking questions about how long she can live with chronic kidney disease and her severe heart failure.  I think she is having difficulty coming to grips with her severe comorbidities-these issues may be better addressed by hospitalist and cardiology.         Principal Problem:   Acute on chronic anemia Active Problems:   Rheumatoid arthritis involving multiple sites with positive rheumatoid factor (HCC)   CKD (chronic kidney disease), stage IV (HCC)   Chronic systolic CHF (congestive heart failure) (HCC)   Paroxysmal atrial fibrillation (HCC)   Hypokalemia   Prolonged QT interval     LOS: 2 days   Aela Bohan PA-C 11/02/2022, 5:12 PM

## 2022-11-02 NOTE — Progress Notes (Signed)
Physical Therapy Treatment Patient Details Name: Tracy Nelson MRN: [id number] DOB: January 09, 1946 Today's Date: 11/02/2022   History of Present Illness Pt is a 77 y.o. female who presented 10/29/22 to the ED after noted to have hemoglobin level of 6.9 on outpatient labs. Upper GI series negative for mass lesion or ulcer, but possible narrowing at EG junction. Suspect her worsening anemia is likely multifactorial in the setting of stage IV CKD, anemia of chronic disease, myeloma, secondary to Imuran and possible hemodilutional with CHF exacerbation. PMH includes chronic systolic CHF, mitral regurgitation, tricuspid regurgitation, chronic anemia, PAF on Eliquis, nonischemic cardiomyopathy, RA, CKD IV, recent IM nail hip 07/10/22, osteoporosis, HTN.    PT Comments    Pt tolerated today's session well, performing ambulation in the room and transferring from [facility] back to the bed with minG and RW. Pt declined any ambulation in the hallway but ambulating between the bed and door, cued for posture and proximity to RW, limited by fatigue but vitals stable. Pt with good standing tolerance for hygiene with minG and 1UE support, pivoting with BUE support from bed rail. Pt is progressing well, continue to recommend HHPT at discharge, acute PT will continue to follow as appropriate to progress mobility and activity tolerance.     Recommendations for follow up therapy are one component of a multi-disciplinary discharge planning process, led by the attending physician.  Recommendations may be updated based on patient status, additional functional criteria and insurance authorization.  Follow Up Recommendations       Assistance Recommended at Discharge Intermittent Supervision/Assistance  Patient can return home with the following A little help with bathing/dressing/bathroom;Assistance with cooking/housework;Direct supervision/assist for medications management;Direct supervision/assist for financial management;Assist  for transportation   Equipment Recommendations  None recommended by PT    Recommendations for Other Services       Precautions / Restrictions Precautions Precautions: Fall Restrictions Weight Bearing Restrictions: No     Mobility  Bed Mobility Overal bed mobility: Needs Assistance Bed Mobility: Sit to Supine       Sit to supine: Min guard, HOB elevated   General bed mobility comments: pt on BSC upon arrival, minG for safety and balance when returning to supine    Transfers Overall transfer level: Needs assistance Equipment used: Rolling walker (2 wheels) Transfers: Sit to/from Stand, Bed to chair/wheelchair/BSC Sit to Stand: Min guard   Step pivot transfers: Min guard       General transfer comment: minG for safety and balance, standing from bed and BSC. Pt pivoting from BSC back to bed with minG and utilizing bed rails for UE support    Ambulation/Gait Ambulation/Gait assistance: Min guard Gait Distance (Feet): 60 Feet Assistive device: Rolling walker (2 wheels) Gait Pattern/deviations: Step-through pattern, Decreased stride length, Shuffle, Decreased dorsiflexion - left, Decreased dorsiflexion - right, Trunk flexed Gait velocity: decreased     General Gait Details: pt declining ambulation in the hallway but agreeable to walking laps in the room, cued for posture and proximity to [employer]             Wheelchair Mobility    Modified Rankin (Stroke Patients Only)       Balance Overall balance assessment: Needs assistance Sitting-balance support: Feet supported Sitting balance-Leahy Scale: Good     Standing balance support: Bilateral upper extremity supported, Reliant on assistive device for balance, During functional activity Standing balance-Leahy Scale: Poor Standing balance comment: reliant on RW for ambulation and at least 1UE for standing when performing  hygiene                            Cognition Arousal/Alertness:  Awake/alert Behavior During Therapy: Flat affect Overall Cognitive Status: No family/caregiver present to determine baseline cognitive functioning                                 General Comments: pt pleasant throughout and oriented to self and place, increased time for command following        Exercises      General Comments General comments (skin integrity, edema, etc.): VSS on room air      Pertinent Vitals/Pain Pain Assessment Pain Assessment: Faces Faces Pain Scale: No hurt    Home Living                          Prior Function            PT Goals (current goals can now be found in the care plan section) Acute Rehab PT Goals Patient Stated Goal: to improve PT Goal Formulation: With patient Time For Goal Achievement: 11/15/22 Potential to Achieve Goals: Good Progress towards PT goals: Progressing toward goals    Frequency    Min 3X/week      PT Plan Current plan remains appropriate    Co-evaluation              AM-PAC PT "6 Clicks" Mobility   Outcome Measure  Help needed turning from your back to your side while in a flat bed without using bedrails?: None Help needed moving from lying on your back to sitting on the side of a flat bed without using bedrails?: A Little Help needed moving to and from a bed to a chair (including a wheelchair)?: A Little Help needed standing up from a chair using your arms (e.g., wheelchair or bedside chair)?: A Little Help needed to walk in hospital room?: A Little Help needed climbing 3-5 steps with a railing? : A Little 6 Click Score: 19    End of Session Equipment Utilized During Treatment: Gait belt Activity Tolerance: Patient tolerated treatment well Patient left: in bed;with call bell/phone within reach Nurse Communication: Mobility status PT Visit Diagnosis: Unsteadiness on feet (R26.81);Other abnormalities of gait and mobility (R26.89);Muscle weakness (generalized) (M62.81)      Time: [id number] PT Time Calculation (min) (ACUTE ONLY): 21 min  Charges:  $Therapeutic Activity: 8-22 mins                     Charlynne Cousins, PT DPT Acute Rehabilitation Services Office [phone]    Luvenia Heller 11/02/2022, 3:01 PM

## 2022-11-02 NOTE — TOC Initial Note (Signed)
Transition of Care [facility]) - Initial/Assessment Note    Patient Details  Name: Tracy Nelson MRN: [id number] Date of Birth: [birth year]  Transition of Care [facility]) CM/SW Contact:    Levonne Lapping, RN Phone Number: 11/02/2022, 3:06 PM  Clinical Narrative:         CM met with Patient bedside for initial assessment and to discuss dc plans. Patient lives with her Daughter, Son in [place] and [place].  She has a rolling walker and a wheelchair.  She uses the wheelchair primarily when leaving the home. Her recommendations are for HHPT and SN for foot wound care.  Patient has been established in the past with [facility] health and would like to have their services again. Liaison has been contacted and referral has been accepted. Patient's Daughter will transport home at DC.   TOC will continue to follow patient for any additional discharge needs         Patient Goals and CMS Choice            Expected Discharge Plan and Services                                              Prior Living Arrangements/Services                       Activities of Daily Living Home Assistive Devices/Equipment: None ADL Screening (condition at time of admission) Patient's cognitive ability adequate to safely complete daily activities?: Yes Is the patient deaf or have difficulty hearing?: No Does the patient have difficulty seeing, even when wearing glasses/contacts?: No Does the patient have difficulty concentrating, remembering, or making decisions?: No Patient able to express need for assistance with ADLs?: Yes Does the patient have difficulty dressing or bathing?: No Independently performs ADLs?: Yes (appropriate for developmental age) Does the patient have difficulty walking or climbing stairs?: No Weakness of Legs: None Weakness of Arms/Hands: None  Permission Sought/Granted                  Emotional Assessment              Admission diagnosis:   Symptomatic anemia [D64.9] Acute on chronic anemia [D64.9] Patient Active Problem List   Diagnosis Date Noted   Acute on chronic anemia 10/30/2022   Hypokalemia 10/30/2022   Prolonged QT interval 10/30/2022   Cataract 09/14/2022   Neoplasm of uncertain behavior of skin 09/14/2022   Uterine prolapse 09/14/2022   Congestive heart failure ([place]) 09/03/2022   Pressure ulcer 09/03/2022   Acute on chronic HFrEF (heart failure with reduced ejection fraction) ([place]) 08/30/2022   Elevated d-dimer 08/30/2022   Anemia of chronic disease 08/30/2022   Paroxysmal atrial fibrillation ([place]) [id number]   Chronic systolic CHF (congestive heart failure) ([place]) 07/13/2022   CKD (chronic kidney disease), stage IV ([place]) 07/09/2022   Displaced intertrochanteric fracture of left femur, initial encounter for closed fracture ([place]) 07/08/2022   Corneal melt, 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   Monoclonal gammopathy 03/10/2021   Hypotension 10/22/2020   Atypical chest pain 10/22/2020   CKD (chronic kidney disease), stage III ([place]) 10/22/2020   Nausea & vomiting 10/22/2020   Physical deconditioning 10/22/2020   Atrial flutter ([place]) 10/18/2020   Arthritis 07/15/2020   GERD (gastroesophageal reflux disease) 07/15/2020   Colitis 07/15/2020   Non-ischemic cardiomyopathy ([place]) 07/15/2020   HFrEF (heart failure  with reduced ejection fraction) ([place]) 07/15/2020   HTN (hypertension) 07/15/2020   Osteoporosis 07/15/2020   Palpitations 07/15/2020   Acute gout of left shoulder 07/10/2020   Raynaud's disease 05/05/2019   Rheumatoid arthritis involving multiple sites with positive rheumatoid factor ([place]) 10/28/2015   Mitral regurgitation 05/30/2014   Acute on chronic combined systolic (congestive) and diastolic (congestive) heart failure ([place]) 05/19/2014   Vitamin D deficiency 05/15/2013   Encounter for long-term (current) use of other medications 12/14/2008   Sjogren's syndrome ([place]) 12/14/2008   PCP:  [name],  [place] [facility]:   [id number] - [place], [address] [place] [address] Phone: [phone] Fax: [place] [address] Phone: [phone] Fax: [phone]  [facility] [address] [place] [address] Phone: [phone] Fax: [phone]     Social Determinants of Health (SDOH) Social History: SDOH Screenings   Food Insecurity: No Food Insecurity (10/30/2022)  Housing: Low Risk  (10/30/2022)  Transportation Needs: No Transportation Needs (10/30/2022)  Utilities: Not At Risk (10/30/2022)  Depression (PHQ2-9): Low Risk  (08/27/2022)  Financial Resource Strain: Low Risk  (04/30/2022)  Physical Activity: Inactive (04/30/2022)  Stress: No Stress Concern Present (04/30/2022)  Tobacco Use: Low Risk  (10/30/2022)   SDOH Interventions:     Readmission Risk Interventions    08/31/2022   12:35 PM  Readmission Risk Prevention Plan  Transportation Screening Complete  HRI or Home Care Consult Complete  Palliative Care Screening Not Applicable  Medication Review (RN Care Manager) Complete

## 2022-11-02 NOTE — Progress Notes (Signed)
Occupational Therapy Treatment Patient Details Name: Tracy Nelson MRN: [id number] DOB: May 01, 1946 Today's Date: 11/02/2022   History of present illness Pt is a 77 y.o. female who presented 10/29/22 to the ED after noted to have hemoglobin level of 6.9 on outpatient labs. Upper GI series negative for mass lesion or ulcer, but possible narrowing at EG junction. Suspect her worsening anemia is likely multifactorial in the setting of stage IV CKD, anemia of chronic disease, myeloma, secondary to Imuran and possible hemodilutional with CHF exacerbation. PMH includes chronic systolic CHF, mitral regurgitation, tricuspid regurgitation, chronic anemia, PAF on Eliquis, nonischemic cardiomyopathy, RA, CKD IV, recent IM nail hip 07/10/22, osteoporosis, HTN.   OT comments  Pt with fatigue, but reports she feels better than upon admission. Min guard assist with RW for ambulation to and from bathroom, toileting and standing grooming. Declined further ADLs and ambulation. VSS on RA.    Recommendations for follow up therapy are one component of a multi-disciplinary discharge planning process, led by the attending physician.  Recommendations may be updated based on patient status, additional functional criteria and insurance authorization.    Assistance Recommended at Discharge Intermittent Supervision/Assistance  Patient can return home with the following  A little help with walking and/or transfers;A little help with bathing/dressing/bathroom;Assistance with cooking/housework;Direct supervision/assist for medications management;Direct supervision/assist for financial management;Assist for transportation;Help with stairs or ramp for entrance   Equipment Recommendations  None recommended by OT    Recommendations for Other Services      Precautions / Restrictions Precautions Precautions: Fall Restrictions Weight Bearing Restrictions: No       Mobility Bed Mobility Overal bed mobility: Needs  Assistance Bed Mobility: Supine to Sit, Sit to Supine     Supine to sit: Min guard Sit to supine: Min guard, HOB elevated        Transfers Overall transfer level: Needs assistance Equipment used: Rolling walker (2 wheels) Transfers: Sit to/from Stand, Bed to chair/wheelchair/BSC Sit to Stand: Min guard           General transfer comment: for safety with RW     Balance Overall balance assessment: Needs assistance   Sitting balance-Leahy Scale: Good       Standing balance-Leahy Scale: Poor Standing balance comment: reliant on RW for ambulation and at least 1UE for standing during pericare                           ADL either performed or assessed with clinical judgement   ADL Overall ADL's : Needs assistance/impaired     Grooming: Wash/dry hands;Oral care;Standing;Min guard                   Toilet Transfer: Min guard;Regular Toilet;Rolling walker (2 wheels);Ambulation   Toileting- Clothing Manipulation and Hygiene: Min guard;Sit to/from stand       Functional mobility during ADLs: Min guard;Rolling walker (2 wheels)      Extremity/Trunk Assessment              Vision       Perception     Praxis      Cognition Arousal/Alertness: Awake/alert Behavior During Therapy: Flat affect Overall Cognitive Status: No family/caregiver present to determine baseline cognitive functioning                                 General Comments: WFL for ADLs performed today  Exercises      Shoulder Instructions       General Comments     Pertinent Vitals/ Pain       Pain Assessment Pain Assessment: No/denies pain  Home Living                                          Prior Functioning/Environment              Frequency  Min 2X/week        Progress Toward Goals  OT Goals(current goals can now be found in the care plan section)  Progress towards OT goals: Progressing toward  goals  Acute Rehab OT Goals OT Goal Formulation: With patient Time For Goal Achievement: 11/15/22 Potential to Achieve Goals: Good  Plan Discharge plan remains appropriate    Co-evaluation                 AM-PAC OT "6 Clicks" Daily Activity     Outcome Measure   Help from another person eating meals?: None Help from another person taking care of personal grooming?: A Little Help from another person toileting, which includes using toliet, bedpan, or urinal?: A Little Help from another person bathing (including washing, rinsing, drying)?: A Lot Help from another person to put on and taking off regular upper body clothing?: A Little Help from another person to put on and taking off regular lower body clothing?: A Lot 6 Click Score: 17    End of Session Equipment Utilized During Treatment: Rolling walker (2 wheels);Gait belt  OT Visit Diagnosis: Unsteadiness on feet (R26.81);Other abnormalities of gait and mobility (R26.89);Muscle weakness (generalized) (M62.81)   Activity Tolerance Patient limited by fatigue   Patient Left in bed;with call bell/phone within reach   Nurse Communication          Time: [id number] OT Time Calculation (min): 21 min  Charges: OT General Charges $OT Visit: 1 Visit OT Treatments $Self Care/Home Management : 8-22 mins  Tracy Nelson, OTR/L Acute Rehabilitation Services Office: [phone]   Malka So 11/02/2022, 3:57 PM

## 2022-11-02 NOTE — Consult Note (Signed)
Advanced Heart Failure Team Consult Note   Primary Physician: [name], [name] PCP-Cardiologist:  None  Reason for Consultation: Heart Failure   HPI:    Tracy Nelson is seen today for evaluation of heart failure at the request of Dr Grafton Folk  Tracy Nelson is a 76 y.o. with history of rheumatoid arthritis, CKD stage IV,  and nonischemic cardiomyopathy.  Patient has been known to have a cardiomyopathy since 2015.  Cardiac MRI in 2015 showed EF 37% with no LGE.  Coronary angiography at that time showed no significant coronary disease.  In 3/21, echo showed EF down to 20-25%.  She was admitted to the hospital in [place], [place] in 11/21 with CHF exacerbation.  Echo showed EF 20-25%, global hypokinesis, mildly decreased RV systolic function.    Repeated an echo on her in 12/21, EF < 20% with moderate LV dilation and mildly decreased RV systolic function. RHC was done, surprisingly showing low filling pressures and relatively preserved cardiac index at 2.22.   Admitted in 3/22 with atrial fibrillation, converted to NSR on amiodarone. She went into atrial flutter after this and had TEE-DCCV in 4/22.  TEE showed EF < 20%, moderate LV dilation, mild RV dilation with moderate RV dysfunction, moderate central MR.  CPX in 3/22 showed severe HF limitation.   PYP scan 4/22 was grade 1 with H/CL 1.48, equivocal.  PYP scan repeated in 3/23 and likely negative.    She was evaluated for [facility] or ANTHEM, BNP too high and thought to be too frail.    Cardiac MRI in 10/22 showed mild LV dilation with EF 23%, normal RV size with RVEF 37%, ECV 34%, small area of subendocardial LGE in the mid inferolateral wall and small area of full thickness LGE in the basal  inferior wall.    Follow up 6/23, BP low and midodrine started. Discussed [place]. Felt to be near end-stage HF.   Admitted 07/08/22 after mechanical fall resulting in left hip fracture. S/P left intramedullary nail 07/10/22.  Post op developed AKI.  Given IV fluids . Diuretics adjusted. Echo showed EF improved to 45%. Discharged to [facility]    Admitted in 1/24 with acute on chronic systolic CHF and AKI. She required milrinone, RHC showed CI 2.81 on milrinone 0.25.  Echo showed EF < 20%.  Suspected end stage HF.    She was seen in the HF clinic last week. Echo EF 20-25%, mild LV dilation, severely decreased RV systolic function, mild RV enlargement, severe biatrial enlargement, moderate-severe TR, moderate MR, IVC normal. Hgb was down to 6.9. She was called and asked to go to the ED.    Admitted with symptomatic anemia. Hgb 7.6. BNP 1500, and creatinine 2.8. Anticoagulants held. CXR with small pleural effsion on left. Given 2 units PRBCs and IV lasix.  GI consulted. Upper GI obtained: narrowed distal esophagus, mod esophageal dysmotility, and mildly tortuous thoracic esophagus. EGD not recommended. Placed on protonix.    Complaining of "band tightness" around abdomen.    Review of Systems: [y] = yes, [ ]  = no   General: Weight gain [ ] ; Weight loss [ ] ; Anorexia [ ] ; Fatigue [Y ]; Fever [ ] ; Chills [ ] ; Weakness [Y]  Cardiac: Chest pain/pressure [ ] ; Resting SOB [ ] ; Exertional SOB [ ] ; Orthopnea [ ] ; Pedal Edema [ ] ; Palpitations [ ] ; Syncope [ ] ; Presyncope [ ] ; Paroxysmal nocturnal dyspnea[ ]   Pulmonary: Cough [ ] ; Wheezing[ ] ; Hemoptysis[ ] ; Sputum [ ] ; Snoring [ ]   GI: Vomiting[ ] ; Dysphagia[ ] ; Melena[ ] ; Hematochezia [ ] ; Heartburn[ ] ; Abdominal pain [ ] ; Constipation [ ] ; Diarrhea [ ] ; BRBPR [ ]   GU: Hematuria[ ] ; Dysuria [ ] ; Nocturia[ ]   Vascular: Pain in legs with walking [ ] ; Pain in feet with lying flat [ ] ; Non-healing sores [ ] ; Stroke [ ] ; TIA [ ] ; Slurred speech [ ] ;  Neuro: Headaches[ ] ; Vertigo[ ] ; Seizures[ ] ; Paresthesias[ ] ;Blurred vision [ ] ; Diplopia [ ] ; Vision changes [ ]   Ortho/Skin: Arthritis [ ] ; Joint pain [Y ]; Muscle pain [ ] ; Joint swelling [ ] ; Back Pain [Y ]; Rash [ ]   Psych: Depression[ ] ; Anxiety[ ]    Heme: Bleeding problems [ ] ; Clotting disorders [ ] ; Anemia [ Y]  Endocrine: Diabetes [ ] ; Thyroid dysfunction[ ]   Home Medications Prior to Admission medications   Medication Sig Start Date End Date Taking? Authorizing Provider  amiodarone (PACERONE) 200 MG tablet TAKE 1 TABLET BY MOUTH EVERY DAY 12/19/21  Yes [name], MD  azaTHIOprine (IMURAN) 50 MG tablet Take 2 tablets (100 mg total) by mouth daily. 09/29/22  Yes Rice, Resa Miner, MD  bumetanide (BUMEX) 1 MG tablet Take 1 tablet (1 mg total) by mouth daily. 09/12/22  Yes Domenic Polite, MD  calcitRIOL (ROCALTROL) 0.25 MCG capsule Take 0.25 mcg by mouth daily. 06/25/22  Yes [provider]  collagenase (SANTYL) 250 UNIT/GM ointment Apply 1 Application topically daily. 09/28/22  Yes Trula Slade, DPM  ELIQUIS 2.5 MG TABS tablet TAKE 1 TABLET BY MOUTH TWICE A DAY Patient taking differently: Take 2.5 mg by mouth 2 (two) times daily. 10/05/22  Yes [name], MD  Ensure (ENSURE) Take 237 mLs by mouth daily as needed (for meal supplement).   Yes [provider]  folic acid (FOLVITE) 1 MG tablet TAKE 1 TABLET BY MOUTH EVERY DAY 07/22/22  Yes [name], MD  hydroxypropyl methylcellulose / hypromellose (ISOPTO TEARS / GONIOVISC) 2.5 % ophthalmic solution Place 1 drop into both eyes as needed for dry eyes.   Yes [provider]  Menthol, Topical Analgesic, (ICY HOT BACK EX) Apply 1 application topically daily as needed (pain).   Yes [provider]  Multiple Vitamin (MULTIVITAMIN) tablet Take 1 tablet by mouth daily.   Yes [provider]  omeprazole (PRILOSEC) 40 MG capsule Take 1 capsule (40 mg total) by mouth daily as needed (for acid reflux). 10/13/22  Yes [name], [name]  polyethylene glycol (MIRALAX / GLYCOLAX) 17 g packet Take 17 g by mouth daily as needed for mild constipation.   Yes [provider]  predniSONE (DELTASONE) 5 MG tablet Take 1 tablet (5 mg total)  by mouth daily with breakfast. 09/29/22  Yes Rice, Resa Miner, MD  Vericiguat (VERQUVO) 10 MG TABS Take 1 tablet (10 mg total) by mouth daily. 10/09/22  Yes [name], MD    Past Medical History: Past Medical History:  Diagnosis Date   Acid reflux 07/15/2020   Arthritis 07/15/2020   CHF (congestive heart failure) ([place])    Colitis 07/15/2020   Dilated cardiomyopathy ([place]) 07/15/2020   HFrEF (heart failure with reduced ejection fraction) ([place]) 07/15/2020   HTN (hypertension) 07/15/2020   Osteoporosis 07/15/2020   Palpitations 07/15/2020   Rheumatoid arthritis ([place]) 07/15/2020    Past Surgical History: Past Surgical History:  Procedure Laterality Date   CARDIOVERSION N/A 11/08/2020   Procedure: CARDIOVERSION;  Surgeon: [name], MD;  Location: [facility];  Service: Cardiovascular;  Laterality: N/A;   HIP FRACTURE SURGERY     INTRAMEDULLARY (IM) NAIL INTERTROCHANTERIC Left 07/10/2022   Procedure: INTRAMEDULLARY (IM) NAIL INTERTROCHANTERIC;  Surgeon: Leandrew Koyanagi, MD;  Location: [facility];  Service: Orthopedics;  Laterality: Left;   RIGHT HEART CATH N/A 08/14/2020   Procedure: RIGHT HEART CATH;  Surgeon: [name], MD;  Location: [place] [facility];  Service: Cardiovascular;  Laterality: N/A;   RIGHT HEART CATH N/A 09/03/2022   Procedure: RIGHT HEART CATH;  Surgeon: [name], MD;  Location: [place] [facility];  Service: Cardiovascular;  Laterality: N/A;   TEE WITHOUT CARDIOVERSION N/A 11/08/2020   Procedure: TRANSESOPHAGEAL ECHOCARDIOGRAM (TEE);  Surgeon: [name], MD;  Location: [facility] ENDOSCOPY;  Service: Cardiovascular;  Laterality: N/A;    Family History: Family History  Problem Relation Age of Onset   Hypertension Mother    Diabetes Mother    Hypertension Father    Diabetes Father    Breast cancer Maternal Aunt    Breast cancer Paternal Aunt    Arthritis Maternal Grandmother    Lung disease Paternal Grandfather    Cancer Brother     Social  History: Social History   Socioeconomic History   Marital status: Widowed    Spouse name: Not on file   Number of children: Not on file   Years of education: 16   Highest education level: Bachelor's degree (e.g., BA, AB, BS)  Occupational History   Occupation: Retired  Tobacco Use   Smoking status: Never   Smokeless tobacco: Never  Vaping Use   Vaping Use: Never used  Substance and Sexual Activity   Alcohol use: Never   Drug use: Never   Sexual activity: Not Currently  Other Topics Concern   Not on file  Social History Narrative   Not on file   Social Determinants of Health   Financial Resource Strain: Low Risk  (04/30/2022)   Overall Financial Resource Strain (CARDIA)    Difficulty of Paying Living Expenses: Not hard at all  Food Insecurity: No Food Insecurity (10/30/2022)   Hunger Vital Sign    Worried About Running Out of Food in the Last Year: Never true    Ran Out of Food in the Last Year: Never true  Transportation Needs: No Transportation Needs (10/30/2022)   PRAPARE - Hydrologist (Medical): No    Lack of Transportation (Non-Medical): No  Physical Activity: Inactive (04/30/2022)   Exercise Vital Sign    Days of Exercise per Week: 0 days    Minutes of Exercise per Session: 0 min  Stress: No Stress Concern Present (04/30/2022)   [place]    Feeling of Stress : Not at all  Social Connections: Not on file    Allergies:  Allergies  Allergen Reactions   Baclofen Nausea And Vomiting   Penicillin G Other (See Comments)    Unknown   Penicillins Rash    Objective:    Vital Signs:   Temp:  [98 F (36.7 C)-98.3 F (36.8 C)] 98 F (36.7 C) (03/25 0400) Pulse Rate:  [72-91] 76 (03/25 0800) Resp:  [14-20] 18 (03/25 0800) BP: (92-121)/(66-89) 102/75 (03/25 0800) SpO2:  [95 %-99 %] 96 % (03/25 0800) Weight:  [49.6 kg] 49.6 kg (03/25 0500) Last BM Date :  11/01/22  Weight change: Filed Weights   10/31/22 0600 11/01/22 0500 11/02/22 0500  Weight: 49 kg 49.1 kg 49.6 kg  Intake/Output:   Intake/Output Summary (Last 24 hours) at 11/02/2022 0850 Last data filed at 11/01/2022 1848 Gross per 24 hour  Intake --  Output 900 ml  Net -900 ml      Physical Exam    General:  No resp difficulty HEENT: normal Neck: supple. JVP 9-10  . Carotids 2+ bilat; no bruits. No lymphadenopathy or thyromegaly appreciated. Cor: PMI nondisplaced. Regular rate & rhythm. No rubs, gallops or murmurs. Lungs: Decreased in LLL Abdomen: soft, nontender, nondistended. No hepatosplenomegaly. No bruits or masses. Good bowel sounds. Extremities: no cyanosis, clubbing, rash, edema Neuro: alert & orientedx3, cranial nerves grossly intact. moves all 4 extremities w/o difficulty. Affect pleasant   Telemetry   SR with PACs personally checked.   EKG    N/A   Labs   Basic Metabolic Panel: Recent Labs  Lab 10/29/22 1658 10/29/22 2010 10/30/22 0630 10/31/22 0551 11/01/22 0638  NA 138 139 142 140 138  K 3.1* 3.3* 3.6 4.1 3.7  CL 98 101 101 103 104  CO2 28 29 29 27 27   GLUCOSE 100* 95 85 99 124*  BUN 30* 30* 35* 33* 37*  CREATININE 2.79* 2.83* 2.82* 2.81* 3.04*  CALCIUM 8.6* 9.0 8.7* 8.9 8.7*  MG  --   --  2.0 2.1 2.0  PHOS  --   --   --  3.0 2.5    Liver Function Tests: Recent Labs  Lab 10/29/22 1658 10/29/22 2010 10/31/22 0551 11/01/22 0638  AST 22 27 22 23   ALT 18 20 18 17   ALKPHOS 57 64 59 61  BILITOT 0.7 0.7 0.7 0.5  PROT 5.7* 6.0* 5.4* 5.0*  ALBUMIN 2.5* 2.7* 2.4* 2.2*   No results for input(s): "LIPASE", "AMYLASE" in the last 168 hours. No results for input(s): "AMMONIA" in the last 168 hours.  CBC: Recent Labs  Lab 10/29/22 1658 10/29/22 2010 10/30/22 0630 10/30/22 1813 10/31/22 0551 10/31/22 1920 11/01/22 0638  WBC 3.0* 3.2*  --   --  3.7*  --  3.3*  NEUTROABS  --  2.1  --   --  2.6  --  2.1  HGB 6.9* 7.6* 10.5* 10.7*  10.8* 10.8* 10.1*  HCT 22.3* 24.5* 31.8* 31.5* 31.9* 33.4* 30.3*  MCV 100.5* 100.8*  --   --  91.1  --  92.7  PLT 197 219  --   --  192  --  186    Cardiac Enzymes: No results for input(s): "CKTOTAL", "CKMB", "CKMBINDEX", "TROPONINI" in the last 168 hours.  BNP: BNP (last 3 results) Recent Labs    09/08/22 0407 10/30/22 0028 11/01/22 0638  BNP 2,633.7* 1,500.5* 2,551.3*    ProBNP (last 3 results) No results for input(s): "PROBNP" in the last 8760 hours.   CBG: No results for input(s): "GLUCAP" in the last 168 hours.  Coagulation Studies: No results for input(s): "LABPROT", "INR" in the last 72 hours.   Imaging   DG CHEST PORT 1 VIEW  Result Date: 11/02/2022 CLINICAL DATA:  Shortness of breath EXAM: PORTABLE CHEST 1 VIEW COMPARISON:  Chest radiograph 11/01/2022 FINDINGS: No pneumothorax. Cardiomegaly. Unchanged left-sided pleural effusion and left basilar airspace opacity. Unchanged focal opacity in the left mid lung zone, possibly atelectasis. No radiographically apparent displaced rib fractures. Visualized upper abdomen is unremarkable. Degenerative changes of the right AC and glenohumeral joints. IMPRESSION: Unchanged left-sided pleural effusion and left basilar airspace opacity. Electronically Signed   By: Marin Roberts M.D.   On: 11/02/2022 07:18     Medications:  Current Medications:  amiodarone  200 mg Oral Daily   apixaban  2.5 mg Oral BID   bumetanide  1 mg Oral Daily   calcitRIOL  0.25 mcg Oral Daily   folic acid  1 mg Oral Daily   multivitamin with minerals  1 tablet Oral Daily   pantoprazole (PROTONIX) IV  40 mg Intravenous Q12H   polyethylene glycol  17 g Oral Daily   predniSONE  5 mg Oral Q breakfast   sodium bicarbonate  650 mg Oral Daily   sodium chloride flush  3 mL Intravenous Q12H   sodium chloride flush  3 mL Intravenous Q12H   Vericiguat  1 tablet Oral Daily    Infusions:  sodium chloride        Patient Profile  77 year old with  multiple medical problems including: RA, HTN, PAF, MR, TR, anemia, CKD Stage IV, and chronic HFrEF.   Admitted with symptomatic anemia.   Assessment/Plan  1. Anemia  -Hgb on admit 7.6-->10.1   Transfused 2 units PRBCs.  - GI consulted. Continue protonix 40 mg IV twice a day.  - Upper GI - narrowing distal esophagus, moderate dysmotility, otherwise unremarkable.  -Hgb pending.   2. Chronic Biventricular HFrEF, NICM  EF has been down for many years. CMRI - non -Echo severely decreased RV systolic function, mild RV enlargement, severe biatrial enlargement, moderate-severe TR, moderate MR - Not a candidate for advanced therapies  BNP 1500>2500. Mild volume overload. Give one more dose of  80 mg IV lasix then back to bumex I mg daily  - Continue verquvo 10 mg daily  -GDMT limited by CKD IV.  - Would not re challenge SGLT2i.  -BMET pending.  ? [facility] candidacy.   3. PAF -Maintaining SR. Continue amio 200 mg daily  - Back on eliquis 2.5 mg twice  a day.   4. CKD Stage IV -Creatinine baseline 2.8-3  -BMET pending   5. Rheumatoid Arthritis On chronic prednisone.   Length of Stay: 2  Darrick Grinder, NP  11/02/2022, 8:50 AM  Advanced Heart Failure Team Pager [phone] (M-F; 7a - 5p)  Please contact [facility] for night-coverage after hours (4p -7a ) and weekends on [email or web address]

## 2022-11-02 NOTE — Progress Notes (Signed)
Mobility Specialist Progress Note   11/02/22 1733  Mobility  Activity Refused mobility   Pt deferring mobility giving no specific reason. Will follow up tomorrow as time permits.   Holland Falling Mobility Specialist Please contact via SecureChat or  Rehab office at [phone]

## 2022-11-03 ENCOUNTER — Ambulatory Visit: Payer: Medicare PPO | Admitting: Nurse Practitioner

## 2022-11-03 ENCOUNTER — Inpatient Hospital Stay (HOSPITAL_COMMUNITY): Payer: Medicare PPO

## 2022-11-03 ENCOUNTER — Other Ambulatory Visit: Payer: Medicare PPO

## 2022-11-03 DIAGNOSIS — I5023 Acute on chronic systolic (congestive) heart failure: Secondary | ICD-10-CM | POA: Diagnosis not present

## 2022-11-03 DIAGNOSIS — R1011 Right upper quadrant pain: Secondary | ICD-10-CM | POA: Diagnosis not present

## 2022-11-03 DIAGNOSIS — R109 Unspecified abdominal pain: Secondary | ICD-10-CM | POA: Diagnosis not present

## 2022-11-03 DIAGNOSIS — I5022 Chronic systolic (congestive) heart failure: Secondary | ICD-10-CM | POA: Diagnosis not present

## 2022-11-03 DIAGNOSIS — N184 Chronic kidney disease, stage 4 (severe): Secondary | ICD-10-CM | POA: Diagnosis not present

## 2022-11-03 DIAGNOSIS — R142 Eructation: Secondary | ICD-10-CM

## 2022-11-03 DIAGNOSIS — D649 Anemia, unspecified: Secondary | ICD-10-CM | POA: Diagnosis not present

## 2022-11-03 LAB — CBC WITH DIFFERENTIAL/PLATELET
Abs Immature Granulocytes: 0.03 10*3/uL (ref 0.00–0.07)
Basophils Absolute: 0 10*3/uL (ref 0.0–0.1)
Basophils Relative: 1 %
Eosinophils Absolute: 0.1 10*3/uL (ref 0.0–0.5)
Eosinophils Relative: 3 %
HCT: 28.7 % — ABNORMAL LOW (ref 36.0–46.0)
Hemoglobin: 9.5 g/dL — ABNORMAL LOW (ref 12.0–15.0)
Immature Granulocytes: 1 %
Lymphocytes Relative: 30 %
Lymphs Abs: 1 10*3/uL (ref 0.7–4.0)
MCH: 30.8 pg (ref 26.0–34.0)
MCHC: 33.1 g/dL (ref 30.0–36.0)
MCV: 93.2 fL (ref 80.0–100.0)
Monocytes Absolute: 0.2 10*3/uL (ref 0.1–1.0)
Monocytes Relative: 7 %
Neutro Abs: 1.9 10*3/uL (ref 1.7–7.7)
Neutrophils Relative %: 58 %
Platelets: 194 10*3/uL (ref 150–400)
RBC: 3.08 MIL/uL — ABNORMAL LOW (ref 3.87–5.11)
RDW: 18.2 % — ABNORMAL HIGH (ref 11.5–15.5)
WBC: 3.2 10*3/uL — ABNORMAL LOW (ref 4.0–10.5)
nRBC: 0 % (ref 0.0–0.2)

## 2022-11-03 LAB — PHOSPHORUS: Phosphorus: 2.5 mg/dL (ref 2.5–4.6)

## 2022-11-03 LAB — COMPREHENSIVE METABOLIC PANEL
ALT: 18 U/L (ref 0–44)
AST: 22 U/L (ref 15–41)
Albumin: 2.2 g/dL — ABNORMAL LOW (ref 3.5–5.0)
Alkaline Phosphatase: 59 U/L (ref 38–126)
Anion gap: 8 (ref 5–15)
BUN: 44 mg/dL — ABNORMAL HIGH (ref 8–23)
CO2: 26 mmol/L (ref 22–32)
Calcium: 8.4 mg/dL — ABNORMAL LOW (ref 8.9–10.3)
Chloride: 101 mmol/L (ref 98–111)
Creatinine, Ser: 3.13 mg/dL — ABNORMAL HIGH (ref 0.44–1.00)
GFR, Estimated: 15 mL/min — ABNORMAL LOW (ref >60)
Glucose, Bld: 107 mg/dL — ABNORMAL HIGH (ref 70–99)
Potassium: 3.8 mmol/L (ref 3.5–5.1)
Sodium: 135 mmol/L (ref 135–145)
Total Bilirubin: 0.7 mg/dL (ref 0.3–1.2)
Total Protein: 5.1 g/dL — ABNORMAL LOW (ref 6.5–8.1)

## 2022-11-03 LAB — MAGNESIUM: Magnesium: 2.2 mg/dL (ref 1.7–2.4)

## 2022-11-03 MED ORDER — METOCLOPRAMIDE HCL 10 MG PO TABS
5.0000 mg | ORAL_TABLET | Freq: Three times a day (TID) | ORAL | Status: DC
  Administered 2022-11-04: 5 mg via ORAL
  Filled 2022-11-03: qty 1

## 2022-11-03 MED ORDER — POLYVINYL ALCOHOL 1.4 % OP SOLN
1.0000 [drp] | OPHTHALMIC | Status: DC | PRN
  Administered 2022-11-03: 1 [drp] via OPHTHALMIC
  Filled 2022-11-03: qty 15

## 2022-11-03 NOTE — Progress Notes (Signed)
PROGRESS NOTE    Tracy Nelson  [id number] DOB: 1946/06/14 DOA: 10/29/2022 PCP: [name], [name]   Brief Narrative:  The patient is a 77 year old African-American female with a past medical history significant for but not limited to rheumatoid arthritis, hypertension, PAF on anticoagulant Eliquis, chronic systolic CHF, mitral regurgitation, tricuspid regurgitation and chronic anemia who was directed to come to the ED from her cardiology group when she was noted to have a hemoglobin of 6.9.  Patient has been reporting increased fatigue and exertional dyspnea for roughly a week and has had increased chronic acid reflux over the same interval with multiple episodes of nonbloody vomiting and immediately after eating.  She is also had some abdominal discomfort that she attributes to acid reflux and denies any melena or hematochezia.  Upon arrival to the ED she is found to be saturating well on room air with systolic blood pressures in the 90s or greater.  EKG showed sinus rhythm with PACs and chest x-ray notable for cardiomegaly.  Labs were notable for hemoglobin of 7.6 potassium 3.3 and a creatinine of 2.3.  She was transfused 2 units of PRBCs for symptomatic as well as given Lasix.  She is tolerating her liquid diet well and not having very much vomiting but still having some burping.   The gastroenterologist feels that she has a significant risk for anesthesia and procedure related complications and recommending just to monitor and they feel that her upper GI series was extremely limited but did not show any mass or lesion also.  She has some narrowing of her esophageal junction and the GI team is recommending advancing diet as tolerated if she tolerates a soft diet with no dysphagia did not recommend an EGD at this point.   GI team feels that her postprandial fullness and abdominal discomfort is likely secondary to severe CHF exacerbation given that she did appear little volume overloaded we will  give her an additional dose of Lasix today.  Cardiology is now diuresing her with 80 mg IV Lasix and GI reevaluated and ordering a right upper quadrant ultrasound.  Assessment and Plan:  Acute on Chronic Anemia - Hgb was 6.9 on outpatient labs and she is noted to have new leukopenia and macrocytosis   - She denies melena or hematochezia  - DDx includes occult GI bleeding, hematologic toxicity of Imuran, and myeloma (M spike noted in January 2024)  - Check anemia panel and FOBT, continue bowel rest and hold Eliquis pending FOBT, hold Imuran, check post-transfusion CBC and trend H&H   -FOBT is negative -Hemoglobin/hematocrit trend: Recent Labs  Lab 10/30/22 0630 10/30/22 1813 10/31/22 0551 10/31/22 1920 11/01/22 0638 11/02/22 1017 11/03/22 0449  HGB 10.5* 10.7* 10.8* 10.8* 10.1* 10.8* 9.5*  HCT 31.8* 31.5* 31.9* 33.4* 30.3* 32.7* 28.7*  MCV  --   --  91.1  --  92.7 93.2 93.2  -GI consulted for further evaluation recommendations -Get PT and OT to further evaluate and treat and do an ambulatory home O2 screen prior to discharge: PT feels that she will need home health at discharge   Reflux and abdominal discomfort with globus sensation and belching -Continue PPI now changed to twice daily will continue for now and is getting continued IV -GI obtaining an upper GI series to exclude any high risk lesions or a large hiatal hernia -Upper GI series done and showed "Significantly limited examination due to the patient's limited ability to reposition on the fluoroscopy table. Narrowed appearance of the distal esophagus/GE  junction suspicious for the presence of a stricture at this site. Endoscopy should be considered for further evaluation. Moderate esophageal dysmotility with decreased peristalsis. Mildly tortuous thoracic esophagus.  Otherwise unremarkable upper GI series, as described." -The GI team feels that she is at significant risk for anesthesia or procedure related complications are  deferring EGD or colonoscopy at this point -Given the upper GI series I spoke with GI and they feel that her symptoms are related to CHF and they recommended continuing to advance diet as tolerated and placed on a soft diet and if she tolerates this diet without dysphagia did not recommend any EGD at this point.  Given this we have resumed her anticoagulation -Patient continues have this abdominal discomfort and sensation and asked to speak with the GI team again given that she continues to burp -Reconsulted GI for further evaluation given that cardiology is diuresing her as below -GI is now checking an upper abdominal ultrasound to rule out ascites and feel that her intermittent belching could be secondary to acid reflux but also be seen with esophageal dysmotility and heart failure -U/S Abdomen done and showed "Bilateral pleural effusions. Fatty liver without focal mass" -GI is now recommending trial of empiric Reglan 5 mg 3 times daily prior to meals and considering a CT scan of the abdomen pelvis noncontrast of her abdomen continues to bother her; they have initiated the Reglan and will see how she tolerates this to see if it helps any of her postprandial symptoms.  If symptoms continue despite this GI recommends the CT scan of her abdomen pelvis noncontrasted   Acute on Chronic Systolic CHF  -EF was 0000000 with global HK, moderate MR, and moderate-severe TR on TTE from 10/29/22  -Appears a little volume overloaded -Given IV Lasix with blood transfusion,  -Continue Butmetanide 1 mg Daily and Vericigut 10 mg po daily, monitor wt and I/Os   -Patient's BNP worsening and went from 1500.5 -> 2551.3 and is now 2087.2 -Given an additional dose of IV Lasix 40 mg x 1 the day before yesterday and additional Dose of IV Lasix 20 mg x1 yesterday; Cardiology giving IV Lasix 80 mg x1 and resumed her home diuretics now  Intake/Output Summary (Last 24 hours) at 11/03/2022 2109 Last data filed at 11/03/2022  1500 Gross per 24 hour  Intake 720 ml  Output 900 ml  Net -180 ml   -Need to monitor for signs and symptoms of volume overload and repeat chest x-ray in a.m. -Consulted cardiology for further evaluation and recommendations and they feel that she has no advanced options available and feel that she is end-stage cardiomyopathy is not a candidate for any advanced therapies and have signed off the case now after giving her increased diuresis. -Cardiology feels that her GDMT is continue to be limited by CKD stage IV and would not rechallenge her SGLT2 inhibitor -Given that she has no real good options we will consult palliative care for further evaluation recommendations -Palliative evaluated and patient is wanting full scope and full code but would not want further invasive test such as EGD for abdominal fullness and she desires to return home and would not want to go to SNF.   CKD stage IV  -Appears close to baseline  -BUN/Cr Trend: Recent Labs  Lab 10/29/22 1658 10/29/22 2010 10/30/22 0630 10/31/22 0551 11/01/22 0638 11/02/22 1017 11/03/22 0449  BUN 30* 30* 35* 33* 37* 42* 44*  CREATININE 2.79* 2.83* 2.82* 2.81* 3.04* 2.79* 3.13*  -Avoid Nephrotoxic Medications,  Contrast Dyes, Hypotension and Dehydration to Ensure Adequate Renal Perfusion and will need to Renally Adjust Meds -Continue to Monitor and Trend Renal Function carefully and repeat CMP in the AM    Rheumatoid Arthritis  - Continue low-dose prednisone, hold Imuran pending anemia workup    Paroxysmal atrial fibrillation  -Was holding Eliquis pending anemia workup and will need to continue to monitor carefully but no S/Sx of Overt Bleeding will resume Anticoagulation  -Continue QT monitoring on telemetry and repeat EKG in am if necessary   Hypokalemia -Patient's K+ Level Trend: Recent Labs  Lab 10/29/22 1658 10/29/22 2010 10/30/22 0630 10/31/22 0551 11/01/22 0638 11/02/22 1017 11/03/22 0449  K 3.1* 3.3* 3.6 4.1 3.7  4.0 3.8  -Continue to Monitor and Replete as Necessary -Repeat CMP in the AM    Prolonged QT interval  -Replace potassium, check magnesium level, repeat EKG in am last QTc was 495 -Continue with Amiodarone   Hypoalbuminemia -Patient's Albumin Trend: Recent Labs  Lab 10/29/22 1658 10/29/22 2010 10/31/22 0551 11/01/22 0638 11/02/22 1017 11/03/22 0449  ALBUMIN 2.5* 2.7* 2.4* 2.2* 2.5* 2.2*  -Continue to Monitor and Trend and repeat CMP in the AM  DVT prophylaxis: apixaban (ELIQUIS) tablet 2.5 mg Start: 11/01/22 1145 SCDs Start: 10/30/22 0148 apixaban (ELIQUIS) tablet 2.5 mg    Code Status: Full Code Family Communication: No family currently at bedside  Disposition Plan:  Level of care: Telemetry Medical Status is: Inpatient Remains inpatient appropriate because: Needs further clinical improvement and GI recommends starting metoclopramide and evaluating for CT scan of the abdomen pelvis if she still not improving.  Anticipate discharge in next 24 to 48 hours if stable   Consultants:  Gastroenterology Cardiology Palliative care medicine  Procedures:  As delineated as above  Antimicrobials:  Anti-infectives (From admission, onward)    None       Subjective: Seen and examined at bedside and she is still having his abdominal tightness and belching.  A little worried about her renal function slightly trending up.  No nausea or vomiting.  Hoping to go home soon.  No other concerns or complaints at this time.  Objective: Vitals:   11/03/22 0800 11/03/22 1200 11/03/22 1600 11/03/22 2000  BP: 108/78 93/61 102/71 108/63  Pulse: 81 80 79 86  Resp: 17 16 17 19   Temp: 98 F (36.7 C) (!) 97.5 F (36.4 C) 98.5 F (36.9 C) 98.4 F (36.9 C)  TempSrc: Oral Oral Oral Oral  SpO2: 97% 96% 98% 96%  Weight:        Intake/Output Summary (Last 24 hours) at 11/03/2022 2106 Last data filed at 11/03/2022 1500 Gross per 24 hour  Intake 720 ml  Output 900 ml  Net -180 ml   Filed  Weights   11/01/22 0500 11/02/22 0500 11/03/22 0500  Weight: 49.1 kg 49.6 kg 49.9 kg   Examination: Physical Exam:  Constitutional: Chronically ill-appearing elderly African-American female who appears to be in no acute distress but continues to complain of his abdominal tightness and belching Respiratory: Diminished to auscultation bilaterally, no wheezing, rales, rhonchi or crackles. Normal respiratory effort and patient is not tachypenic. No accessory muscle use.  Unlabored breathing Cardiovascular: RRR, no murmurs / rubs / gallops. S1 and S2 auscultated.  Has mild lower extremity edema Abdomen: Soft, non-tender, nondistended. Bowel sounds positive.  GU: Deferred. Musculoskeletal: No clubbing / cyanosis of digits/nails. No joint deformity in the upper and lower extremities.  Skin: No rashes, lesions, ulcers on a limited skin evaluation. No  induration; Warm and dry.  Neurologic: CN 2-12 grossly intact with no focal deficits. Romberg sign and cerebellar reflexes not assessed.  Psychiatric: Normal judgment and insight. Alert and oriented x 3. Normal mood and appropriate affect.   Data Reviewed: I have personally reviewed following labs and imaging studies  CBC: Recent Labs  Lab 10/29/22 2010 10/30/22 0630 10/31/22 0551 10/31/22 1920 11/01/22 0638 11/02/22 1017 11/03/22 0449  WBC 3.2*  --  3.7*  --  3.3* 3.5* 3.2*  NEUTROABS 2.1  --  2.6  --  2.1 2.0 1.9  HGB 7.6*   < > 10.8* 10.8* 10.1* 10.8* 9.5*  HCT 24.5*   < > 31.9* 33.4* 30.3* 32.7* 28.7*  MCV 100.8*  --  91.1  --  92.7 93.2 93.2  PLT 219  --  192  --  186 198 194   < > = values in this interval not displayed.   Basic Metabolic Panel: Recent Labs  Lab 10/30/22 0630 10/31/22 0551 11/01/22 0638 11/02/22 1017 11/03/22 0449  NA 142 140 138 140 135  K 3.6 4.1 3.7 4.0 3.8  CL 101 103 104 103 101  CO2 29 27 27 27 26   GLUCOSE 85 99 124* 93 107*  BUN 35* 33* 37* 42* 44*  CREATININE 2.82* 2.81* 3.04* 2.79* 3.13*   CALCIUM 8.7* 8.9 8.7* 8.8* 8.4*  MG 2.0 2.1 2.0 2.1 2.2  PHOS  --  3.0 2.5 2.5 2.5   GFR: Estimated Creatinine Clearance: 11.9 mL/min (A) (by C-G formula based on SCr of 3.13 mg/dL (H)). Liver Function Tests: Recent Labs  Lab 10/29/22 2010 10/31/22 0551 11/01/22 0638 11/02/22 1017 11/03/22 0449  AST 27 22 23 22 22   ALT 20 18 17 17 18   ALKPHOS 64 59 61 58 59  BILITOT 0.7 0.7 0.5 0.5 0.7  PROT 6.0* 5.4* 5.0* 5.5* 5.1*  ALBUMIN 2.7* 2.4* 2.2* 2.5* 2.2*   No results for input(s): "LIPASE", "AMYLASE" in the last 168 hours. No results for input(s): "AMMONIA" in the last 168 hours. Coagulation Profile: Recent Labs  Lab 10/29/22 2010  INR 1.1   Cardiac Enzymes: No results for input(s): "CKTOTAL", "CKMB", "CKMBINDEX", "TROPONINI" in the last 168 hours. BNP (last 3 results) No results for input(s): "PROBNP" in the last 8760 hours. HbA1C: No results for input(s): "HGBA1C" in the last 72 hours. CBG: No results for input(s): "GLUCAP" in the last 168 hours. Lipid Profile: No results for input(s): "CHOL", "HDL", "LDLCALC", "TRIG", "CHOLHDL", "LDLDIRECT" in the last 72 hours. Thyroid Function Tests: No results for input(s): "TSH", "T4TOTAL", "FREET4", "T3FREE", "THYROIDAB" in the last 72 hours. Anemia Panel: No results for input(s): "VITAMINB12", "FOLATE", "FERRITIN", "TIBC", "IRON", "RETICCTPCT" in the last 72 hours. Sepsis Labs: No results for input(s): "PROCALCITON", "LATICACIDVEN" in the last 168 hours.  No results found for this or any previous visit (from the past 240 hour(s)).   Radiology Studies: DG CHEST PORT 1 VIEW  Result Date: 11/03/2022 CLINICAL DATA:  Shortness of breath. EXAM: PORTABLE CHEST 1 VIEW COMPARISON:  Chest x-ray November 02, 2022. FINDINGS: Similar left pleural effusion with overlying streaky left basilar opacities. No visible pneumothorax. Enlarged cardiac silhouette. No acute osseous abnormality. IMPRESSION: Similar left pleural effusion with overlying  streaky left basilar opacities. Electronically Signed   By: Margaretha Sheffield M.D.   On: 11/03/2022 08:23   US Abdomen Complete  Result Date: 11/02/2022 CLINICAL DATA:  Upper abdominal pain EXAM: ABDOMEN ULTRASOUND COMPLETE COMPARISON:  None Available. FINDINGS: Gallbladder: No gallstones or wall  thickening visualized. No sonographic Murphy sign noted by sonographer. Common bile duct: Diameter: 3.6 mm Liver: Increased echogenicity consistent with fatty infiltration. No focal mass is noted. Portal vein is patent on color Doppler imaging with normal direction of blood flow towards the liver. IVC: No abnormality visualized. Pancreas: Visualized portion unremarkable. Spleen: Size and appearance within normal limits. Right Kidney: Length: 7.4 cm. Echogenicity within normal limits. No mass or hydronephrosis visualized. Left Kidney: Length: 8.7 cm. Echogenicity within normal limits. No mass or hydronephrosis visualized. Abdominal aorta: No aneurysm visualized. Other findings: Bilateral pleural effusions are noted. IMPRESSION: Bilateral pleural effusions. Fatty liver without focal mass. Electronically Signed   By: Inez Catalina M.D.   On: 11/02/2022 19:40   DG CHEST PORT 1 VIEW  Result Date: 11/02/2022 CLINICAL DATA:  Shortness of breath EXAM: PORTABLE CHEST 1 VIEW COMPARISON:  Chest radiograph 11/01/2022 FINDINGS: No pneumothorax. Cardiomegaly. Unchanged left-sided pleural effusion and left basilar airspace opacity. Unchanged focal opacity in the left mid lung zone, possibly atelectasis. No radiographically apparent displaced rib fractures. Visualized upper abdomen is unremarkable. Degenerative changes of the right AC and glenohumeral joints. IMPRESSION: Unchanged left-sided pleural effusion and left basilar airspace opacity. Electronically Signed   By: Marin Roberts M.D.   On: 11/02/2022 07:18    Scheduled Meds:  amiodarone  200 mg Oral Daily   apixaban  2.5 mg Oral BID   calcitRIOL  0.25 mcg Oral Daily    folic acid  1 mg Oral Daily   multivitamin with minerals  1 tablet Oral Daily   pantoprazole (PROTONIX) IV  40 mg Intravenous Q12H   polyethylene glycol  17 g Oral Daily   predniSONE  5 mg Oral Q breakfast   sodium bicarbonate  650 mg Oral Daily   sodium chloride flush  3 mL Intravenous Q12H   sodium chloride flush  3 mL Intravenous Q12H   Vericiguat  1 tablet Oral Daily   Continuous Infusions:  sodium chloride      LOS: 3 days   Raiford Noble, DO [facility] Available via Epic secure chat 7am-7pm After these hours, please refer to coverage provider listed on [email or web address] 11/03/2022, 9:06 PM

## 2022-11-03 NOTE — Progress Notes (Signed)
Progress Note   Subjective  PAtient stable - feels the same. Has tightness in her upper abdomen at times, chronic, and some regurgitation and belching post prandially which are her main symptoms. US done yesterday without concerning findings - no ascites. She does pleural effusions    Objective   Vital signs in last 24 hours: Temp:  [97.5 F (36.4 C)-98.5 F (36.9 C)] 97.5 F (36.4 C) (03/26 1200) Pulse Rate:  [78-88] 80 (03/26 1200) Resp:  [16-25] 16 (03/26 1200) BP: (86-108)/(61-78) 93/61 (03/26 1200) SpO2:  [95 %-98 %] 96 % (03/26 1200) Weight:  [49.9 kg] 49.9 kg (03/26 0500) Last BM Date : 11/02/22 General:    AA female in NAD Neurologic:  Alert and oriented,  grossly normal neurologically. Psych:  Cooperative. Normal mood and affect.  Intake/Output from previous day: 03/25 0701 - 03/26 0700 In: 275 [P.O.:275] Out: 600 [Urine:600] Intake/Output this shift: Total I/O In: 240 [P.O.:240] Out: 300 [Urine:300]  Lab Results: Recent Labs    11/01/22 0638 11/02/22 1017 11/03/22 0449  WBC 3.3* 3.5* 3.2*  HGB 10.1* 10.8* 9.5*  HCT 30.3* 32.7* 28.7*  PLT 186 198 194   BMET Recent Labs    11/01/22 0638 11/02/22 1017 11/03/22 0449  NA 138 140 135  K 3.7 4.0 3.8  CL 104 103 101  CO2 27 27 26   GLUCOSE 124* 93 107*  BUN 37* 42* 44*  CREATININE 3.04* 2.79* 3.13*  CALCIUM 8.7* 8.8* 8.4*   LFT Recent Labs    11/03/22 0449  PROT 5.1*  ALBUMIN 2.2*  AST 22  ALT 18  ALKPHOS 59  BILITOT 0.7   PT/INR No results for input(s): "LABPROT", "INR" in the last 72 hours.  Studies/Results: DG CHEST PORT 1 VIEW  Result Date: 11/03/2022 CLINICAL DATA:  Shortness of breath. EXAM: PORTABLE CHEST 1 VIEW COMPARISON:  Chest x-ray November 02, 2022. FINDINGS: Similar left pleural effusion with overlying streaky left basilar opacities. No visible pneumothorax. Enlarged cardiac silhouette. No acute osseous abnormality. IMPRESSION: Similar left pleural effusion with overlying  streaky left basilar opacities. Electronically Signed   By: Margaretha Sheffield M.D.   On: 11/03/2022 08:23   US Abdomen Complete  Result Date: 11/02/2022 CLINICAL DATA:  Upper abdominal pain EXAM: ABDOMEN ULTRASOUND COMPLETE COMPARISON:  None Available. FINDINGS: Gallbladder: No gallstones or wall thickening visualized. No sonographic Murphy sign noted by sonographer. Common bile duct: Diameter: 3.6 mm Liver: Increased echogenicity consistent with fatty infiltration. No focal mass is noted. Portal vein is patent on color Doppler imaging with normal direction of blood flow towards the liver. IVC: No abnormality visualized. Pancreas: Visualized portion unremarkable. Spleen: Size and appearance within normal limits. Right Kidney: Length: 7.4 cm. Echogenicity within normal limits. No mass or hydronephrosis visualized. Left Kidney: Length: 8.7 cm. Echogenicity within normal limits. No mass or hydronephrosis visualized. Abdominal aorta: No aneurysm visualized. Other findings: Bilateral pleural effusions are noted. IMPRESSION: Bilateral pleural effusions. Fatty liver without focal mass. Electronically Signed   By: Inez Catalina M.D.   On: 11/02/2022 19:40   DG CHEST PORT 1 VIEW  Result Date: 11/02/2022 CLINICAL DATA:  Shortness of breath EXAM: PORTABLE CHEST 1 VIEW COMPARISON:  Chest radiograph 11/01/2022 FINDINGS: No pneumothorax. Cardiomegaly. Unchanged left-sided pleural effusion and left basilar airspace opacity. Unchanged focal opacity in the left mid lung zone, possibly atelectasis. No radiographically apparent displaced rib fractures. Visualized upper abdomen is unremarkable. Degenerative changes of the right AC and glenohumeral joints. IMPRESSION: Unchanged left-sided pleural  effusion and left basilar airspace opacity. Electronically Signed   By: Marin Roberts M.D.   On: 11/02/2022 07:18       Assessment / Plan:    77 y/o female here with the following issues:  Belching / regurgitation Tightness in  upper abdomen Anemia Stage IV CKD CHF  Had a lengthy discussion with the patient in regards to her symptoms and how much this bothers her in regards to how much risk she wants to take in regards to more invasive procedures such as upper endoscopy.  She has some intermittent mild dysphagia, likely due to dysmotility noted on upper GI series however possibility of GEJ stenosis.  She also has some belching and regurgitation postprandially but no vomiting.  On PPI.  Her ultrasound does not show anything too concerning.  She has ongoing issues with CHF.  We discussed that under normal circumstances would likely proceed with an upper endoscopy consider empiric dilation, clear her upper tract.  For her in light of ongoing CHF issues, her risks for anesthesia are higher than average.  I agree with holding off on upper endoscopy until her other medical problems are improved.  We had a good discussion about upper endoscopy and she agrees she wants to hold off on that for now.  Her hemoglobin is stable and likely multifactorial as previously noted.  She is not having any overt bleeding.  Hopefully with treatment of her heart failure her other medical problems improved.  We discussed other options such as potentially adding some empiric Reglan to see if this might help some of her postprandial symptoms, and continuing PPI.  If no interactions with her other medications, I think we could try some low-dose Reglan 5 mg 3 times daily prior to meals to see if that helps.  If the tightness in her abdomen and symptoms persist despite this, consider noncontrast CT scan in light of her CKD to make sure nothing concerning going on, we can consider that pending her course.  Otherwise we can consider rest of this evaluation as outpatient.  She can follow-up with [place] in the office once out of the hospital and improved from cardiac perspective.  PLAN: - continue PPI - would consider trial of empiric Reglan 5mg  TID prior to meals  if no med interactions / contraindications - consider CT abdomen / pelvis noncontrast if her abdomen continues to bother her - we discussed role of EGD - may consider this as outpatient once recovered from her acute cardiac issues, but she agrees on holding off on that for now while he acute issues are addressed as she is higher risk for anesthesia. - we can follow up with her as outpatient following discharge.  We will sign off for now, call with questions moving forward.  Jolly Mango, MD [facility]enter[facility]

## 2022-11-03 NOTE — Progress Notes (Signed)
Physical Therapy Treatment Patient Details Name: Tracy Nelson MRN: [id number] DOB: 18-Jul-1946 Today's Date: 11/03/2022   History of Present Illness Pt is a 77 y.o. female who presented 10/29/22 to the ED after noted to have hemoglobin level of 6.9 on outpatient labs. Upper GI series negative for mass lesion or ulcer, but possible narrowing at EG junction. Suspect her worsening anemia is likely multifactorial in the setting of stage IV CKD, anemia of chronic disease, myeloma, secondary to Imuran and possible hemodilutional with CHF exacerbation. PMH includes chronic systolic CHF, mitral regurgitation, tricuspid regurgitation, chronic anemia, PAF on Eliquis, nonischemic cardiomyopathy, RA, CKD IV, recent IM nail hip 07/10/22, osteoporosis, HTN.    PT Comments    Pt greeted supine in bed and agreeable to session with steady progress towards acute goals. Pt needing grossly min guard for OOB mobility with RW support for in room ambulation with cues for technique throughout. Pt continues to decline ambulation in hall but able to complete x3 laps in room from EOB<>door with further distance limited by fatigue and L hip pain. Encouraged and educated pt on continued and frequent mobilization as well as continued RW use post acutely to maximize functional independence, safety, and decrease risk for falls, with pt verbalizing understanding. Pt continues to benefit from skilled PT services to progress toward functional mobility goals.    Recommendations for follow up therapy are one component of a multi-disciplinary discharge planning process, led by the attending physician.  Recommendations may be updated based on patient status, additional functional criteria and insurance authorization.  Follow Up Recommendations       Assistance Recommended at Discharge Intermittent Supervision/Assistance  Patient can return home with the following A little help with bathing/dressing/bathroom;Assistance with  cooking/housework;Direct supervision/assist for medications management;Direct supervision/assist for financial management;Assist for transportation   Equipment Recommendations  None recommended by PT    Recommendations for Other Services       Precautions / Restrictions Precautions Precautions: Fall Restrictions Weight Bearing Restrictions: No     Mobility  Bed Mobility Overal bed mobility: Needs Assistance Bed Mobility: Supine to Sit     Supine to sit: Min guard     General bed mobility comments: min guard for safety, + rail use and increased time    Transfers Overall transfer level: Needs assistance Equipment used: Rolling walker (2 wheels) Transfers: Sit to/from Stand, Bed to chair/wheelchair/BSC Sit to Stand: Min guard           General transfer comment: for safety with RW    Ambulation/Gait Ambulation/Gait assistance: Min guard Gait Distance (Feet): 65 Feet Assistive device: Rolling walker (2 wheels) Gait Pattern/deviations: Step-through pattern, Decreased stride length, Shuffle, Decreased dorsiflexion - left, Decreased dorsiflexion - right, Trunk flexed Gait velocity: decreased     General Gait Details: pt contuneus to decline ambulation in the hallway but agreeable to laps in the room, cued for posture and proximity to [employer] Mobility    Modified Rankin (Stroke Patients Only)       Balance Overall balance assessment: Needs assistance Sitting-balance support: Feet supported Sitting balance-Leahy Scale: Good     Standing balance support: Bilateral upper extremity supported, Reliant on assistive device for balance, During functional activity Standing balance-Leahy Scale: Poor Standing balance comment: reliant on RW for ambulation and at least 1UE for standing during pericare  Cognition Arousal/Alertness: Awake/alert Behavior During Therapy: Flat affect Overall Cognitive  Status: Within Functional Limits for tasks assessed                                          Exercises      General Comments General comments (skin integrity, edema, etc.): VSS on RA      Pertinent Vitals/Pain Pain Assessment Pain Assessment: Faces Faces Pain Scale: Hurts a little bit Pain Location: L hip Pain Descriptors / Indicators: Discomfort, Guarding Pain Intervention(s): Monitored during session, Limited activity within patient's tolerance, Repositioned    Home Living                          Prior Function            PT Goals (current goals can now be found in the care plan section) Acute Rehab PT Goals Patient Stated Goal: to go home PT Goal Formulation: With patient Time For Goal Achievement: 11/15/22 Progress towards PT goals: Progressing toward goals    Frequency    Min 3X/week      PT Plan Current plan remains appropriate    Co-evaluation              AM-PAC PT "6 Clicks" Mobility   Outcome Measure  Help needed turning from your back to your side while in a flat bed without using bedrails?: None Help needed moving from lying on your back to sitting on the side of a flat bed without using bedrails?: A Little Help needed moving to and from a bed to a chair (including a wheelchair)?: A Little Help needed standing up from a chair using your arms (e.g., wheelchair or bedside chair)?: A Little Help needed to walk in hospital room?: A Little Help needed climbing 3-5 steps with a railing? : A Little 6 Click Score: 19    End of Session Equipment Utilized During Treatment: Gait belt Activity Tolerance: Patient tolerated treatment well Patient left: with call bell/phone within reach;in chair Nurse Communication: Mobility status PT Visit Diagnosis: Unsteadiness on feet (R26.81);Other abnormalities of gait and mobility (R26.89);Muscle weakness (generalized) (M62.81)     Time: [id number] PT Time Calculation (min) (ACUTE  ONLY): 23 min  Charges:  $Gait Training: 8-22 mins $Therapeutic Activity: 8-22 mins                    Thedora Rings R. PTA Acute Rehabilitation Services Office: [place] 11/03/2022, 12:12 PM

## 2022-11-03 NOTE — Consult Note (Signed)
Consultation Note Date: 11/03/2022   Patient Name: Tracy Nelson  DOB: 24-Jul-1946  MRN: [id number]  Age / Sex: 77 y.o., female  PCP: [name], [place] Referring Physician: Kerney Elbe, DO  Reason for Consultation:   [place] discussion and abdominal tightness. Cards feels she has end-stage cardiomyopathy  HPI/Patient Profile: 77 y.o. female  with past medical history of RA on Imuran, HTN, PAF on Eliquis, CHF- systolic- EF 123XX123 % with global hypokinesis, CKD IV, s/p L hip nailing in December of this year, admission in January for HF exacerbation, admitted on 10/29/2022 with anemia- Hgb at 6.9 by PCP, 7.6 in ED. She also has acute kidney injury- Cr was 2.36 on admission- today was 3.13- possibly d/t repeated lasix IV needed for volume overload. She has had complaints of abdominal fullness and GI has consulted and notes it is possibly related to her CHF- they discussed possible EGD, but patient has declined. Palliative medicine consulted for [place].   Primary Decision Maker PATIENT  Discussion: I have reviewed medical records including Care Everywhere, progress notes from this and prior admissions, labs and imaging.  On evaluation patient was awake, alert, oriented, pleasant. Able to make her own healthcare decisions. She was sitting up in chair, eating lunch.  I introduced Palliative Medicine as specialized medical care for people living with serious illness. It focuses on providing relief from the symptoms and stress of a serious illness. The goal is to improve quality of life for both the patient and the family.  [name] is followed outpatient by [facility] and was last seen in February 2024.   We discussed a brief life review of the patient. She is originally from [place]- outside of [place], [place]. She moved to [place] to live with her daughter.    As far as functional and nutritional  status- prior to admission she is mostly independent. Has some support from family and hired caregivers. She is unable to stand long enough to make her own meals. She enjoys having her mind stimulated. She has grandchildren in the home and enjoys interacting and spending time with them.    We discussed patient's current illness and what it means in the larger context of patient's on-going co-morbidities.  Natural disease trajectory and expectations at EOL were discussed. She is aware of the seriousness of her condition and that she is likely facing end of life in the near future.   I attempted to elicit values and goals of care important to the patient. Her primary goal is to be able to be awake and alert and interact and spend time with her family. She does sometimes consider that being hospitalized and stuck for labs again and again is too much for her, however, she is not yet ready to make any changes in her plan of care.   Advance directives,  and code status were discussed. She continues to endorse her desire to be full code with efforts at resuscitation. She would want to be kept alive on life support for at least  several days, and then be let go if she did not improve.  Her daughter is her HCPOA and surrogate decision maker if she were unable to make her own decisions.   She wants most medical interventions offered that are tolerable to her. She does not want further invasive testing by GI. She hesitates to participate with PT.   Discussed with patient/family the importance of continued conversation with family and the medical providers regarding overall plan of care and treatment options, ensuring decisions are within the context of the patient's values and GOCs.    Questions and concerns were addressed.    SUMMARY OF RECOMMENDATIONS -Continue full scope, full code -Would not want further invasive tests such as EGD for abdominal fullness -Desires to return home- would not want to go to  rehab facility -PMT will continue to follow while inpatient- plan for continued followup with [facility] after discharge- she is currently active with them    Code Status/Advance Care Planning: Full code   Prognosis:   Unable to determine  Discharge Planning: Home with Palliative Services  Primary Diagnoses: Present on Admission:  Acute on chronic anemia  Paroxysmal atrial fibrillation (HCC)  CKD (chronic kidney disease), stage IV (HCC)  Chronic systolic CHF (congestive heart failure) (HCC)  Hypokalemia  Rheumatoid arthritis involving multiple sites with positive rheumatoid factor (HCC)  Prolonged QT interval   Review of Systems  Constitutional:  Positive for activity change. Negative for appetite change.  Respiratory:  Negative for shortness of breath.     Physical Exam Vitals and nursing note reviewed.  Constitutional:      General: She is not in acute distress. Pulmonary:     Effort: Pulmonary effort is normal.  Neurological:     Mental Status: She is alert and oriented to person, place, and time.     Vital Signs: BP 93/61 (BP Location: Left Arm)   Pulse 80   Temp (!) 97.5 F (36.4 C) (Oral)   Resp 16   Wt 49.9 kg   SpO2 96%   BMI 18.88 kg/m  Pain Scale: 0-10   Pain Score: 0-No pain   SpO2: SpO2: 96 % O2 Device:SpO2: 96 % O2 Flow Rate: .   IO: Intake/output summary:  Intake/Output Summary (Last 24 hours) at 11/03/2022 1458 Last data filed at 11/03/2022 1220 Gross per 24 hour  Intake 515 ml  Output 900 ml  Net -385 ml    LBM: Last BM Date : 11/02/22 Baseline Weight: Weight: 49 kg Most recent weight: Weight: 49.9 kg       Thank you for this consult. Palliative medicine will continue to follow and assist as needed.  Time Total: 90 minutes Greater than 50%  of this time was spent counseling and coordinating care related to the above assessment and plan.  Signed by: Mariana Kaufman, AGNP-C Palliative Medicine    Please contact Palliative  Medicine Team phone at [phone] for questions and concerns.  For individual provider: See Shea Evans

## 2022-11-03 NOTE — Progress Notes (Signed)
Mobility Specialist Progress Note   11/03/22 1835  Mobility  Activity Ambulated with assistance in room  Level of Assistance Standby assist, set-up cues, supervision of patient - no hands on  Assistive Device Front wheel walker  Distance Ambulated (ft) 60 ft (3x20)  Activity Response Tolerated well  Mobility Referral Yes  $Mobility charge 1 Mobility   Pt received in bed w/ no complaints and agreeable. Able to get EOB and stand w/ x2 trials at stand by assist. Ambulated in room w/ a steady gait but pt fatiguing fairly quickly and requesting to limit session. Returned back to bed d/t increased fatigue, left call bell in reach w/ bed alarm on.   [place] Falling Mobility Specialist Please contact via SecureChat or  Rehab office at [phone]

## 2022-11-03 NOTE — Plan of Care (Addendum)
  Problem: Clinical Measurements: Goal: Diagnostic test results will improve Outcome: Not Progressing   Problem: Clinical Measurements: Goal: Cardiovascular complication will be avoided Outcome: Not Progressing

## 2022-11-03 NOTE — Care Management Important Message (Signed)
Important Message  Patient Details  Name: Tracy Nelson MRN: [id number] Date of Birth: 04/01/1946   Medicare Important Message Given:  Yes     Orbie Pyo 11/03/2022, 2:27 PM

## 2022-11-04 ENCOUNTER — Inpatient Hospital Stay (HOSPITAL_COMMUNITY): Payer: Medicare PPO

## 2022-11-04 ENCOUNTER — Other Ambulatory Visit (HOSPITAL_COMMUNITY): Payer: Self-pay

## 2022-11-04 DIAGNOSIS — D649 Anemia, unspecified: Secondary | ICD-10-CM | POA: Diagnosis not present

## 2022-11-04 DIAGNOSIS — I5022 Chronic systolic (congestive) heart failure: Secondary | ICD-10-CM | POA: Diagnosis not present

## 2022-11-04 DIAGNOSIS — M0579 Rheumatoid arthritis with rheumatoid factor of multiple sites without organ or systems involvement: Secondary | ICD-10-CM | POA: Diagnosis not present

## 2022-11-04 DIAGNOSIS — I480 Paroxysmal atrial fibrillation: Secondary | ICD-10-CM | POA: Diagnosis not present

## 2022-11-04 LAB — CBC WITH DIFFERENTIAL/PLATELET
Abs Immature Granulocytes: 0.04 10*3/uL (ref 0.00–0.07)
Basophils Absolute: 0 10*3/uL (ref 0.0–0.1)
Basophils Relative: 1 %
Eosinophils Absolute: 0.1 10*3/uL (ref 0.0–0.5)
Eosinophils Relative: 2 %
HCT: 30.5 % — ABNORMAL LOW (ref 36.0–46.0)
Hemoglobin: 9.8 g/dL — ABNORMAL LOW (ref 12.0–15.0)
Immature Granulocytes: 1 %
Lymphocytes Relative: 29 %
Lymphs Abs: 1 10*3/uL (ref 0.7–4.0)
MCH: 30.7 pg (ref 26.0–34.0)
MCHC: 32.1 g/dL (ref 30.0–36.0)
MCV: 95.6 fL (ref 80.0–100.0)
Monocytes Absolute: 0.4 10*3/uL (ref 0.1–1.0)
Monocytes Relative: 12 %
Neutro Abs: 1.8 10*3/uL (ref 1.7–7.7)
Neutrophils Relative %: 55 %
Platelets: 184 10*3/uL (ref 150–400)
RBC: 3.19 MIL/uL — ABNORMAL LOW (ref 3.87–5.11)
RDW: 18.6 % — ABNORMAL HIGH (ref 11.5–15.5)
WBC: 3.3 10*3/uL — ABNORMAL LOW (ref 4.0–10.5)
nRBC: 0 % (ref 0.0–0.2)

## 2022-11-04 LAB — COMPREHENSIVE METABOLIC PANEL
ALT: 18 U/L (ref 0–44)
AST: 21 U/L (ref 15–41)
Albumin: 2.2 g/dL — ABNORMAL LOW (ref 3.5–5.0)
Alkaline Phosphatase: 62 U/L (ref 38–126)
Anion gap: 8 (ref 5–15)
BUN: 47 mg/dL — ABNORMAL HIGH (ref 8–23)
CO2: 28 mmol/L (ref 22–32)
Calcium: 8.6 mg/dL — ABNORMAL LOW (ref 8.9–10.3)
Chloride: 102 mmol/L (ref 98–111)
Creatinine, Ser: 2.78 mg/dL — ABNORMAL HIGH (ref 0.44–1.00)
GFR, Estimated: 17 mL/min — ABNORMAL LOW (ref >60)
Glucose, Bld: 92 mg/dL (ref 70–99)
Potassium: 3.7 mmol/L (ref 3.5–5.1)
Sodium: 138 mmol/L (ref 135–145)
Total Bilirubin: 0.3 mg/dL (ref 0.3–1.2)
Total Protein: 4.9 g/dL — ABNORMAL LOW (ref 6.5–8.1)

## 2022-11-04 LAB — MAGNESIUM: Magnesium: 2.1 mg/dL (ref 1.7–2.4)

## 2022-11-04 LAB — PHOSPHORUS: Phosphorus: 2.4 mg/dL — ABNORMAL LOW (ref 2.5–4.6)

## 2022-11-04 MED ORDER — METOCLOPRAMIDE HCL 5 MG PO TABS
5.0000 mg | ORAL_TABLET | Freq: Three times a day (TID) | ORAL | 0 refills | Status: DC | PRN
  Filled 2022-11-04: qty 30, 10d supply, fill #0

## 2022-11-04 MED ORDER — OMEPRAZOLE 40 MG PO CPDR
40.0000 mg | DELAYED_RELEASE_CAPSULE | Freq: Two times a day (BID) | ORAL | 1 refills | Status: DC
  Filled 2022-11-04: qty 60, 30d supply, fill #0

## 2022-11-04 NOTE — Progress Notes (Signed)
Discharge instructions (including medications) discussed with and copy provided to patient/caregiver 

## 2022-11-04 NOTE — TOC Transition Note (Signed)
Transition of Care [facility]) - CM/SW Discharge Note   Patient Details  Name: Tracy Nelson MRN: [id number] Date of Birth: November 23, 1945  [facility] CM/SW Contact:  Levonne Lapping, RN Phone Number: 11/04/2022, 10:11 AM   Clinical Narrative:     Patient to Dc to home with Daughter, Son in [place] for PT OT and SN has been confirmed  No additional TOC needs   Daughter to transport       Barriers to Discharge: Continued Medical Work up   Patient Goals and CMS Choice CMS Medicare.gov Compare Post Acute Care list provided to:: Patient Choice offered to / list presented to : Patient ([place] used in past. Patient choice again)  Discharge Placement                         Discharge Plan and Services Additional resources added to the After Visit Summary for     Discharge Planning Services: CM Consult Post Acute Care Choice: Home Health          DME Arranged: N/A DME Agency: NA       HH Arranged: RN, PT HH Agency: [place] Date [place]: 11/02/22 Time [place]: 1536 Representative spoke with at [place]: [place] Determinants of Health ([place]) Interventions SDOH Screenings   Food Insecurity: No Food Insecurity (10/30/2022)  Housing: Low Risk  (10/30/2022)  Transportation Needs: No Transportation Needs (10/30/2022)  Utilities: Not At Risk (10/30/2022)  Depression (PHQ2-9): Low Risk  (08/27/2022)  Financial Resource Strain: Low Risk  (04/30/2022)  Physical Activity: Inactive (04/30/2022)  Stress: No Stress Concern Present (04/30/2022)  Tobacco Use: Low Risk  (10/30/2022)     Readmission Risk Interventions    08/31/2022   12:35 PM  Readmission Risk Prevention Plan  Transportation Screening Complete  HRI or [place] Complete  Palliative Care Screening Not Applicable  Medication Review (RN Care Manager) Complete

## 2022-11-04 NOTE — Discharge Summary (Addendum)
PATIENT DETAILS Name: Tracy Nelson Age: 77 y.o. Sex: female Date of Birth: 03-29-1946 MRN: [id number]. Admitting Physician: Vianne Bulls, MD [id number], Doreene Burke, [name]  Admit Date: 10/29/2022 Discharge date: 11/04/2022  Recommendations for Outpatient Follow-up:  Follow up with PCP in 1-2 weeks Please obtain CMP/CBC in one week  Admitted From:  Home  Disposition: Home   Discharge Condition: fair  CODE STATUS:   Code Status: Full Code   Diet recommendation:  Diet Order             Diet - low sodium heart healthy           Diet 2 gram sodium Room service appropriate? Yes; Fluid consistency: Thin  Diet effective now                    Brief Summary: 77 year old with end-stage biventricular heart failure, PAF on Eliquis, rheumatoid arthritis on Imuran/prednisone-was referred to the ED by cardiology after she was noted to have a hemoglobin of 6.9.  She was admitted to the hospitalist service-and transfused 2 units of PRBC.  There was no overt blood loss-FOBT was negative-gastroenterology did not feel endoscopic evaluation was warranted.  Hospital course was complicated by worsening reflux/belching-requiring GI consultation for medication management.  See below for further details.  Brief Hospital Course: Normocytic anemia Felt to be due to underlying chronic disease No evidence of overt blood loss-even FOBT was negative Transfused 2 units of PRBC-hemoglobin stable since then Review of prior labs-M spike positive for the past several years-unclear clinical significance-will send a epic referral to hematology for posthospital discharge follow-up.   Will continue to hold Imuran on discharge-see below. Continue to follow CBC closely in the outpatient setting.  Dyspepsia Improved with supportive care including PPI/Reglan. Given severe underlying cardiac issues-GI did not feel he EGD was warranted. Thankfully she has tolerated diet relatively well-had a chicken salad  yesterday-without any significant dyspeptic symptoms or dysphagia. Suspect she can be discharged with supportive care-and have GI follow-up in the outpatient setting if her symptoms continue.  CT scan of the abdomen was contemplated-but before due to clinical improvement-benign abdominal exam-this has been deferred for now-this can always be done in the outpatient setting if her symptoms recur.  Acute on chronic biventricular heart failure Felt to be end-stage per advanced heart failure team note Managed with IV diuretics-volume status stable Per advanced heart failure team note-patient has end-stage cardiomyopathy-and is not a candidate for advanced therapies.  No other advanced options available.  Evaluated by palliative care this admission-patient desires full scope of treatment for now.    PAF Stable Continue amiodarone/Eliquis  CKD 4 Creatinine close to baseline  Rheumatoid arthritis Continue prednisone Imuran held due to concern for its role and anemia-discussed through epic chat with patient's primary rheumatologist-Dr. Rice-continue to hold Imuran-he will arrange for follow-up.  BMI: Estimated body mass index is 18.88 kg/m as calculated from the following:   Height as of 09/29/22: 5\' 4"  (1.626 m).   Weight as of this encounter: 49.9 kg.    Discharge Diagnoses:  Principal Problem:   Acute on chronic anemia Active Problems:   CKD (chronic kidney disease), stage IV (HCC)   Paroxysmal atrial fibrillation (HCC)   Rheumatoid arthritis involving multiple sites with positive rheumatoid factor (HCC)   Chronic systolic CHF (congestive heart failure) (HCC)   Hypokalemia   Prolonged QT interval   Abdominal discomfort   Belching   Discharge Instructions:  Activity:  As tolerated with Full fall  precautions use walker/cane & assistance as needed   Discharge Instructions     (HEART FAILURE PATIENTS) Call MD:  Anytime you have any of the following symptoms: 1) 3 pound weight  gain in 24 hours or 5 pounds in 1 week 2) shortness of breath, with or without a dry hacking cough 3) swelling in the hands, feet or stomach 4) if you have to sleep on extra pillows at night in order to breathe.   Complete by: As directed    Ambulatory referral to Hematology / Oncology   Complete by: As directed    Anemia with positive M spike   Diet - low sodium heart healthy   Complete by: As directed    Increase activity slowly   Complete by: As directed    No wound care   Complete by: As directed       Allergies as of 11/04/2022       Reactions   Baclofen Nausea And Vomiting   Penicillin G Other (See Comments)   Unknown   Penicillins Rash        Medication List     STOP taking these medications    azaTHIOprine 50 MG tablet Commonly known as: IMURAN       TAKE these medications    amiodarone 200 MG tablet Commonly known as: PACERONE TAKE 1 TABLET BY MOUTH EVERY DAY   bumetanide 1 MG tablet Commonly known as: BUMEX Take 1 tablet (1 mg total) by mouth daily.   calcitRIOL 0.25 MCG capsule Commonly known as: ROCALTROL Take 0.25 mcg by mouth daily.   Eliquis 2.5 MG Tabs tablet Generic drug: apixaban TAKE 1 TABLET BY MOUTH TWICE A DAY What changed: how much to take   Ensure Take 237 mLs by mouth daily as needed (for meal supplement).   folic acid 1 MG tablet Commonly known as: FOLVITE TAKE 1 TABLET BY MOUTH EVERY DAY   hydroxypropyl methylcellulose / hypromellose 2.5 % ophthalmic solution Commonly known as: ISOPTO TEARS / GONIOVISC Place 1 drop into both eyes as needed for dry eyes.   ICY HOT BACK EX Apply 1 application topically daily as needed (pain).   metoCLOPramide 5 MG tablet Commonly known as: REGLAN Take 1 tablet (5 mg total) by mouth every 8 (eight) hours as needed for nausea or vomiting.   multivitamin tablet Take 1 tablet by mouth daily.   omeprazole 40 MG capsule Commonly known as: PRILOSEC Take 1 capsule (40 mg total) by mouth 2  (two) times daily. What changed:  when to take this reasons to take this   polyethylene glycol 17 g packet Commonly known as: MIRALAX / GLYCOLAX Take 17 g by mouth daily as needed for mild constipation.   predniSONE 5 MG tablet Commonly known as: DELTASONE Take 1 tablet (5 mg total) by mouth daily with breakfast.   Santyl 250 UNIT/GM ointment Generic drug: collagenase Apply 1 Application topically daily.   Verquvo 10 MG Tabs Generic drug: Vericiguat Take 1 tablet (10 mg total) by mouth daily.        Follow-up Information     [facility] Follow up.   Specialty: [place] Why: [facility] will provide your home health physical thareapy and a skilled nurse Contact information: [place] [address] [place] Follow up on 11/19/2022.   Specialty: Cardiology Why: at 3:00 Contact information: [address] [id number] mc [place]        [name], [place]. Schedule an appointment as soon as possible for a visit in 1 week(s).   Specialty: General Practice Contact information: [address] [place] [address] [phone]         [name], MD Follow up.   Specialty: Rheumatology Why: Hospital follow up, Office will call with date/time, If you dont hear from them,please give them a call Contact information: [address] [place] [address] [phone]         [facility] Follow up.   Specialty: Oncology Why: Hospital follow up, Office will call with date/time, If you dont hear from them,please give them a call Contact information: [place] [id number] [place] [id number] [phone]               Allergies  Allergen Reactions   Baclofen Nausea And Vomiting   Penicillin G  Other (See Comments)    Unknown   Penicillins Rash     Other Procedures/Studies: DG CHEST PORT 1 VIEW  Result Date: 11/04/2022 CLINICAL DATA:  Shortness of breath EXAM: PORTABLE CHEST 1 VIEW COMPARISON:  Chest radiograph 11/03/2022 FINDINGS: No pneumothorax. No right-sided pleural effusion. Cardiomegaly. Persistent airspace opacity of the left lung base, which likely represents a combination of a pleural effusion and superimposed airspace opacity. Prominent bilateral interstitial opacities which are favored to represent changes related to pulmonary venous congestion. No radiographically apparent displaced rib fractures. Visualized upper abdomen is unremarkable. IMPRESSION: 1. Left-sided pleural effusion with superimposed airspace opacity, which could represent atelectasis or infection. 2. Cardiomegaly with mild pulmonary edema. Electronically Signed   By: Marin Roberts M.D.   On: 11/04/2022 08:04   DG CHEST PORT 1 VIEW  Result Date: 11/03/2022 CLINICAL DATA:  Shortness of breath. EXAM: PORTABLE CHEST 1 VIEW COMPARISON:  Chest x-ray November 02, 2022. FINDINGS: Similar left pleural effusion with overlying streaky left basilar opacities. No visible pneumothorax. Enlarged cardiac silhouette. No acute osseous abnormality. IMPRESSION: Similar left pleural effusion with overlying streaky left basilar opacities. Electronically Signed   By: Margaretha Sheffield M.D.   On: 11/03/2022 08:23   US Abdomen Complete  Result Date: 11/02/2022 CLINICAL DATA:  Upper abdominal pain EXAM: ABDOMEN ULTRASOUND COMPLETE COMPARISON:  None Available. FINDINGS: Gallbladder: No gallstones or wall thickening visualized. No sonographic Murphy sign noted by sonographer. Common bile duct: Diameter: 3.6 mm Liver: Increased echogenicity consistent with fatty infiltration. No focal mass is noted. Portal vein is patent on color Doppler imaging with normal direction of blood flow towards the liver. IVC: No abnormality visualized. Pancreas:  Visualized portion unremarkable. Spleen: Size and appearance within normal limits. Right Kidney: Length: 7.4 cm. Echogenicity within normal limits. No mass or hydronephrosis visualized. Left Kidney: Length: 8.7 cm. Echogenicity within normal limits. No mass or hydronephrosis visualized. Abdominal aorta: No aneurysm visualized. Other findings: Bilateral pleural effusions are noted. IMPRESSION: Bilateral pleural effusions. Fatty liver without focal mass. Electronically Signed   By: Inez Catalina M.D.   On: 11/02/2022 19:40   DG CHEST PORT 1 VIEW  Result Date: 11/02/2022 CLINICAL DATA:  Shortness of breath EXAM: PORTABLE CHEST 1 VIEW COMPARISON:  Chest radiograph 11/01/2022 FINDINGS: No pneumothorax. Cardiomegaly. Unchanged left-sided pleural effusion and left basilar airspace opacity. Unchanged focal opacity in the left mid lung zone, possibly atelectasis. No radiographically apparent displaced rib fractures. Visualized upper abdomen is unremarkable. Degenerative changes of  the right AC and glenohumeral joints. IMPRESSION: Unchanged left-sided pleural effusion and left basilar airspace opacity. Electronically Signed   By: Marin Roberts M.D.   On: 11/02/2022 07:18   DG CHEST PORT 1 VIEW  Result Date: 11/01/2022 CLINICAL DATA:  141880 SOB (shortness of breath) [id number] EXAM: PORTABLE CHEST - 1 VIEW COMPARISON:  10/29/2022 FINDINGS: Persistent airspace disease in the left lower lung. Mild central pulmonary vascular congestion is suspected. Right lung otherwise clear. Stable cardiomegaly. Persistent blunting of the left lateral costophrenic angle with mild elevation of the left diaphragmatic leaflet. Left shoulder DJD. IMPRESSION: Persistent left lower lung airspace disease and effusion. Electronically Signed   By: Lucrezia Europe M.D.   On: 11/01/2022 08:49   DG UGI W SINGLE CM (SOL OR THIN BA)  Result Date: 10/30/2022 CLINICAL DATA:  Provided history: Nausea. Abdominal pain. Additional history provided:  Gastroesophageal reflux disease with intermittent dysphagia, globus sensation. EXAM: DG UGI W SINGLE CM TECHNIQUE: A single contrast examination was performed using thin liquid barium. The exam was performed by Narda Rutherford, NP, and was supervised and interpreted by Dr. Kellie Simmering. FLUOROSCOPY: Fluoroscopy time: 2 minutes, 48 seconds (44.00 mGy). COMPARISON:  Abdominal radiograph 08/29/2020. FINDINGS: Significantly limited examination due to the patient's limited ability to reposition on the fluoroscopy table. Mildly tortuous thoracic esophagus. Narrowed appearance of the distal esophagus/GE junction. Esophagus normal in caliber and smooth in contour elsewhere. Moderate esophageal dysmotility with decreased peristalsis. No appreciable hiatal hernia. No gastroesophageal reflux was observed. Within described limitations (and within the limitation of a single contrast examination), unremarkable appearance of the stomach, duodenal bulb and duodenal sweep. IMPRESSION: 1. Significantly limited examination due to the patient's limited ability to reposition on the fluoroscopy table. 2. Narrowed appearance of the distal esophagus/GE junction suspicious for the presence of a stricture at this site. Endoscopy should be considered for further evaluation. 3. Moderate esophageal dysmotility with decreased peristalsis. 4. Mildly tortuous thoracic esophagus. 5. Otherwise unremarkable upper GI series, as described. Electronically Signed   By: Kellie Simmering D.O.   On: 10/30/2022 17:32   DG Chest Port 1 View  Result Date: 10/29/2022 CLINICAL DATA:  Dyspnea on exertion. EXAM: PORTABLE CHEST 1 VIEW COMPARISON:  Chest radiograph dated September 07, 2018. FINDINGS: The heart is enlarged. Aorta is tortuous. Left basilar opacity with atelectasis and/or small effusion. Right lung is clear. Thoracic spondylosis. IMPRESSION: 1. Left basilar opacity with atelectasis and/or small effusion. 2. Cardiomegaly. Electronically Signed   By: Keane Police D.O.   On: 10/29/2022 23:37   ECHOCARDIOGRAM COMPLETE  Result Date: 10/29/2022    ECHOCARDIOGRAM REPORT   Patient Name:   Tracy Nelson Date of Exam: 10/29/2022 Medical Rec #:  [id number]         Height:       64.0 in Accession #:    [id number]        Weight:       100.0 lb Date of Birth:  10/31/1945          BSA:          1.457 m Patient Age:    18 years          BP:           98/66 mmHg Patient Gender: F                 HR:           90 bpm. Exam Location:  Inpatient Procedure: 2D Echo, Cardiac Doppler  and Color Doppler Indications:    [id number] Chronic systolic (congestive) heart failure  History:        Patient has prior history of Echocardiogram examinations.                 Cardiomyopathy; Risk Factors:Hypertension.  Sonographer:    Phineas Douglas Referring Phys: [phone] AMY D CLEGG IMPRESSIONS  1. Left ventricular ejection fraction, by estimation, is 20 to 25%. The left ventricle has severely decreased function. The left ventricle demonstrates global hypokinesis. The left ventricular internal cavity size was mildly dilated. Left ventricular diastolic function could not be evaluated. Elevated left ventricular end-diastolic pressure.  2. Right ventricular systolic function is severely reduced. The right ventricular size is mildly enlarged. There is normal pulmonary artery systolic pressure.  3. Left atrial size was severely dilated.  4. Right atrial size was severely dilated.  5. The mitral valve is degenerative. Moderate mitral valve regurgitation. No evidence of mitral stenosis.  6. Tricuspid valve regurgitation is moderate to severe.  7. The aortic valve is calcified. Aortic valve regurgitation is mild to moderate. Aortic valve sclerosis/calcification is present, without any evidence of aortic stenosis.  8. Abdominal aorta is normal size.  9. The inferior vena cava is normal in size with greater than 50% respiratory variability, suggesting right atrial pressure of 3 mmHg. Comparison(s): A prior study  was performed on 08/2022. No significant change from prior study. FINDINGS  Left Ventricle: Left ventricular ejection fraction, by estimation, is 20 to 25%. The left ventricle has severely decreased function. The left ventricle demonstrates global hypokinesis. The left ventricular internal cavity size was mildly dilated. There is no left ventricular hypertrophy. Left ventricular diastolic function could not be evaluated due to mitral regurgitation (moderate or greater). Left ventricular diastolic function could not be evaluated. Elevated left ventricular end-diastolic pressure. Right Ventricle: The right ventricular size is mildly enlarged. No increase in right ventricular wall thickness. Right ventricular systolic function is severely reduced. There is normal pulmonary artery systolic pressure. The tricuspid regurgitant velocity is 2.82 m/s, and with an assumed right atrial pressure of 3 mmHg, the estimated right ventricular systolic pressure is [id number]. Left Atrium: Left atrial size was severely dilated. Right Atrium: Right atrial size was severely dilated. Pericardium: There is no evidence of pericardial effusion. Mitral Valve: The mitral valve is degenerative in appearance. Moderate mitral valve regurgitation. No evidence of mitral valve stenosis. Tricuspid Valve: The tricuspid valve is normal in structure. Tricuspid valve regurgitation is moderate to severe. No evidence of tricuspid stenosis. Aortic Valve: The aortic valve is calcified. Aortic valve regurgitation is mild to moderate. Aortic regurgitation PHT measures 332 msec. Aortic valve sclerosis/calcification is present, without any evidence of aortic stenosis. Pulmonic Valve: The pulmonic valve was not well visualized. Pulmonic valve regurgitation is mild. No evidence of pulmonic stenosis. Aorta: Abdominal aorta is normal size. The aortic root is normal in size and structure. Venous: The inferior vena cava is normal in size with greater than 50%  respiratory variability, suggesting right atrial pressure of 3 mmHg. IAS/Shunts: No atrial level shunt detected by color flow Doppler.  LEFT VENTRICLE PLAX 2D LVIDd:         5.90 cm      Diastology LVIDs:         5.40 cm      LV e' medial:    4.35 cm/s LV PW:         1.00 cm      LV E/e' medial:  20.2 LV  IVS:        0.80 cm      LV e' lateral:   7.72 cm/s LVOT diam:     2.00 cm      LV E/e' lateral: 11.4 LV SV:         56 LV SV Index:   39 LVOT Area:     3.14 cm  LV Volumes (MOD) LV vol d, MOD A2C: 190.0 ml LV vol d, MOD A4C: 161.0 ml LV vol s, MOD A2C: 143.0 ml LV vol s, MOD A4C: 116.0 ml LV SV MOD A2C:     47.0 ml LV SV MOD A4C:     161.0 ml LV SV MOD BP:      43.5 ml RIGHT VENTRICLE             IVC RV Basal diam:  4.70 cm     IVC diam: 2.00 cm RV S prime:     13.30 cm/s TAPSE (M-mode): 2.5 cm LEFT ATRIUM             Index        RIGHT ATRIUM           Index LA diam:        2.60 cm 1.78 cm/m   RA Area:     24.00 cm LA Vol (A2C):   87.3 ml 59.91 ml/m  RA Volume:   87.10 ml  59.77 ml/m LA Vol (A4C):   62.5 ml 42.89 ml/m LA Biplane Vol: 75.6 ml 51.88 ml/m  AORTIC VALVE LVOT Vmax:   107.00 cm/s LVOT Vmean:  68.200 cm/s LVOT VTI:    0.179 m AI PHT:      332 msec  AORTA Ao Root diam: 2.90 cm Ao Asc diam:  3.10 cm MITRAL VALVE                  TRICUSPID VALVE MV Area (PHT): 6.71 cm       TR Peak grad:   31.8 mmHg MV Decel Time: 113 msec       TR Vmax:        282.00 cm/s MR Peak grad:    84.3 mmHg MR Mean grad:    45.0 mmHg    SHUNTS MR Vmax:         459.00 cm/s  Systemic VTI:  0.18 m MR Vmean:        316.0 cm/s   Systemic Diam: 2.00 cm MR PISA:         0.57 cm MR PISA Eff ROA: 4 mm MR PISA Radius:  0.30 cm MV E velocity: 87.90 cm/s MV A velocity: 160.00 cm/s MV E/A ratio:  0.55 Kardie Tobb DO Electronically signed by Berniece Salines DO Signature Date/Time: 10/29/2022/4:04:54 PM    Final      TODAY-DAY OF DISCHARGE:  Subjective:   [name] today has no headache,no chest abdominal pain,no new weakness  tingling or numbness, feels much better wants to go home today.   Objective:   Blood pressure 115/82, pulse 78, temperature 98.4 F (36.9 C), temperature source Oral, resp. rate 17, weight 49.9 kg, SpO2 98 %.  Intake/Output Summary (Last 24 hours) at 11/04/2022 0918 Last data filed at 11/03/2022 1500 Gross per 24 hour  Intake 720 ml  Output 300 ml  Net 420 ml   Filed Weights   11/01/22 0500 11/02/22 0500 11/03/22 0500  Weight: 49.1 kg 49.6 kg 49.9 kg    Exam: Awake Alert, Oriented *3, No new  F.N deficits, Normal affect [place].AT,PERRAL Supple Neck,No JVD, No cervical lymphadenopathy appriciated.  Symmetrical Chest wall movement, Good air movement bilaterally, CTAB RRR,No Gallops,Rubs or new Murmurs, No Parasternal Heave +ve B.Sounds, Abd Soft, Non tender, No organomegaly appriciated, No rebound -guarding or rigidity. No Cyanosis, Clubbing or edema, No new Rash or bruise   PERTINENT RADIOLOGIC STUDIES: DG CHEST PORT 1 VIEW  Result Date: 11/04/2022 CLINICAL DATA:  Shortness of breath EXAM: PORTABLE CHEST 1 VIEW COMPARISON:  Chest radiograph 11/03/2022 FINDINGS: No pneumothorax. No right-sided pleural effusion. Cardiomegaly. Persistent airspace opacity of the left lung base, which likely represents a combination of a pleural effusion and superimposed airspace opacity. Prominent bilateral interstitial opacities which are favored to represent changes related to pulmonary venous congestion. No radiographically apparent displaced rib fractures. Visualized upper abdomen is unremarkable. IMPRESSION: 1. Left-sided pleural effusion with superimposed airspace opacity, which could represent atelectasis or infection. 2. Cardiomegaly with mild pulmonary edema. Electronically Signed   By: Marin Roberts M.D.   On: 11/04/2022 08:04   DG CHEST PORT 1 VIEW  Result Date: 11/03/2022 CLINICAL DATA:  Shortness of breath. EXAM: PORTABLE CHEST 1 VIEW COMPARISON:  Chest x-ray November 02, 2022. FINDINGS: Similar  left pleural effusion with overlying streaky left basilar opacities. No visible pneumothorax. Enlarged cardiac silhouette. No acute osseous abnormality. IMPRESSION: Similar left pleural effusion with overlying streaky left basilar opacities. Electronically Signed   By: Margaretha Sheffield M.D.   On: 11/03/2022 08:23   US Abdomen Complete  Result Date: 11/02/2022 CLINICAL DATA:  Upper abdominal pain EXAM: ABDOMEN ULTRASOUND COMPLETE COMPARISON:  None Available. FINDINGS: Gallbladder: No gallstones or wall thickening visualized. No sonographic Murphy sign noted by sonographer. Common bile duct: Diameter: 3.6 mm Liver: Increased echogenicity consistent with fatty infiltration. No focal mass is noted. Portal vein is patent on color Doppler imaging with normal direction of blood flow towards the liver. IVC: No abnormality visualized. Pancreas: Visualized portion unremarkable. Spleen: Size and appearance within normal limits. Right Kidney: Length: 7.4 cm. Echogenicity within normal limits. No mass or hydronephrosis visualized. Left Kidney: Length: 8.7 cm. Echogenicity within normal limits. No mass or hydronephrosis visualized. Abdominal aorta: No aneurysm visualized. Other findings: Bilateral pleural effusions are noted. IMPRESSION: Bilateral pleural effusions. Fatty liver without focal mass. Electronically Signed   By: Inez Catalina M.D.   On: 11/02/2022 19:40     PERTINENT LAB RESULTS: CBC: Recent Labs    11/03/22 0449 11/04/22 0336  WBC 3.2* 3.3*  HGB 9.5* 9.8*  HCT 28.7* 30.5*  PLT 194 184   CMET CMP     Component Value Date/Time   NA 138 11/04/2022 0336   NA 143 08/27/2022 1658   K 3.7 11/04/2022 0336   CL 102 11/04/2022 0336   CO2 28 11/04/2022 0336   GLUCOSE 92 11/04/2022 0336   BUN 47 (H) 11/04/2022 0336   BUN 37 (H) 08/27/2022 1658   CREATININE 2.78 (H) 11/04/2022 0336   CALCIUM 8.6 (L) 11/04/2022 0336   PROT 4.9 (L) 11/04/2022 0336   PROT 6.8 06/04/2021 1240   ALBUMIN 2.2 (L)  11/04/2022 0336   ALBUMIN 4.0 06/04/2021 1240   AST 21 11/04/2022 0336   ALT 18 11/04/2022 0336   ALKPHOS 62 11/04/2022 0336   BILITOT 0.3 11/04/2022 0336   BILITOT 0.3 06/04/2021 1240   GFRNONAA 17 (L) 11/04/2022 0336    GFR Estimated Creatinine Clearance: 13.4 mL/min (A) (by C-G formula based on SCr of 2.78 mg/dL (H)). No results for input(s): "LIPASE", "AMYLASE" in  the last 72 hours. No results for input(s): "CKTOTAL", "CKMB", "CKMBINDEX", "TROPONINI" in the last 72 hours. Invalid input(s): "POCBNP" No results for input(s): "DDIMER" in the last 72 hours. No results for input(s): "HGBA1C" in the last 72 hours. No results for input(s): "CHOL", "HDL", "LDLCALC", "TRIG", "CHOLHDL", "LDLDIRECT" in the last 72 hours. No results for input(s): "TSH", "T4TOTAL", "T3FREE", "THYROIDAB" in the last 72 hours.  Invalid input(s): "FREET3" No results for input(s): "VITAMINB12", "FOLATE", "FERRITIN", "TIBC", "IRON", "RETICCTPCT" in the last 72 hours. Coags: No results for input(s): "INR" in the last 72 hours.  Invalid input(s): "PT" Microbiology: No results found for this or any previous visit (from the past 240 hour(s)).  FURTHER DISCHARGE INSTRUCTIONS:  Get Medicines reviewed and adjusted: Please take all your medications with you for your next visit with your Primary MD  Laboratory/radiological data: Please request your Primary MD to go over all hospital tests and procedure/radiological results at the follow up, please ask your Primary MD to get all Hospital records sent to his/her office.  In some cases, they will be blood work, cultures and biopsy results pending at the time of your discharge. Please request that your primary care M.D. goes through all the records of your hospital data and follows up on these results.  Also Note the following: If you experience worsening of your admission symptoms, develop shortness of breath, life threatening emergency, suicidal or homicidal thoughts  you must seek medical attention immediately by calling 911 or calling your MD immediately  if symptoms less severe.  You must read complete instructions/literature along with all the possible adverse reactions/side effects for all the Medicines you take and that have been prescribed to you. Take any new Medicines after you have completely understood and accpet all the possible adverse reactions/side effects.   Do not drive when taking Pain medications or sleeping medications (Benzodaizepines)  Do not take more than prescribed Pain, Sleep and Anxiety Medications. It is not advisable to combine anxiety,sleep and pain medications without talking with your primary care practitioner  Special Instructions: If you have smoked or chewed Tobacco  in the last 2 yrs please stop smoking, stop any regular Alcohol  and or any Recreational drug use.  Wear Seat belts while driving.  Please note: You were cared for by a hospitalist during your hospital stay. Once you are discharged, your primary care physician will handle any further medical issues. Please note that NO REFILLS for any discharge medications will be authorized once you are discharged, as it is imperative that you return to your primary care physician (or establish a relationship with a primary care physician if you do not have one) for your post hospital discharge needs so that they can reassess your need for medications and monitor your lab values.  Total Time spent coordinating discharge including counseling, education and face to face time equals greater than 30 minutes.  SignedOren Binet 11/04/2022 9:18 AM

## 2022-11-04 NOTE — Plan of Care (Signed)
  Problem: Education: Goal: Knowledge of General Education information will improve Description: Including pain rating scale, medication(s)/side effects and non-pharmacologic comfort measures Outcome: Adequate for Discharge   Problem: Health Behavior/Discharge Planning: Goal: Ability to manage health-related needs will improve Outcome: Adequate for Discharge   Problem: Clinical Measurements: Goal: Ability to maintain clinical measurements within normal limits will improve Outcome: Adequate for Discharge Goal: Will remain free from infection Outcome: Adequate for Discharge Goal: Diagnostic test results will improve Outcome: Adequate for Discharge Goal: Respiratory complications will improve Outcome: Adequate for Discharge Goal: Cardiovascular complication will be avoided Outcome: Adequate for Discharge   Problem: Activity: Goal: Risk for activity intolerance will decrease Outcome: Adequate for Discharge   Problem: Nutrition: Goal: Adequate nutrition will be maintained Outcome: Adequate for Discharge   Problem: Coping: Goal: Level of anxiety will decrease Outcome: Adequate for Discharge   Problem: Elimination: Goal: Will not experience complications related to bowel motility Outcome: Adequate for Discharge Goal: Will not experience complications related to urinary retention Outcome: Adequate for Discharge   Problem: Pain Managment: Goal: General experience of comfort will improve Outcome: Adequate for Discharge   Problem: Safety: Goal: Ability to remain free from injury will improve Outcome: Adequate for Discharge   Problem: Skin Integrity: Goal: Risk for impaired skin integrity will decrease Outcome: Adequate for Discharge   Problem: Acute Rehab OT Goals (only OT should resolve) Goal: Pt. Will Perform Upper Body Dressing Outcome: Adequate for Discharge Goal: Pt. Will Perform Lower Body Dressing Outcome: Adequate for Discharge Goal: Pt. Will Transfer To  Toilet Outcome: Adequate for Discharge   Problem: Acute Rehab PT Goals(only PT should resolve) Goal: Pt Will Go Supine/Side To Sit Outcome: Adequate for Discharge Goal: Pt Will Go Sit To Supine/Side Outcome: Adequate for Discharge Goal: Patient Will Transfer Sit To/From Stand Outcome: Adequate for Discharge Goal: Pt Will Ambulate Outcome: Adequate for Discharge

## 2022-11-05 ENCOUNTER — Ambulatory Visit: Payer: Self-pay

## 2022-11-05 ENCOUNTER — Telehealth: Payer: Self-pay

## 2022-11-05 NOTE — Chronic Care Management (AMB) (Signed)
   11/05/2022  Tracy Nelson [birth year] [id number]   Reason for Encounter: Patient is not currently enrolled in the CCM program. CCM status changed to previously enrolled.   Horris Latino RN Care Manager/Chronic Care Management [phone]

## 2022-11-05 NOTE — Transitions of Care (Post Inpatient/ED Visit) (Signed)
   11/05/2022  Name: Tracy Nelson MRN: [id number] DOB: 1946-05-13  Today's TOC FU Call Status: Today's TOC FU Call Status:: Successful TOC FU Call Competed TOC FU Call Complete Date: 11/05/22  Transition Care Management Follow-up Telephone Call Date of Discharge: 11/04/22 Discharge [facility]) Type of Discharge: Inpatient Admission Primary Inpatient Discharge Diagnosis:: Acute on chronic anemia How have you been since you were released from the hospital?: Better Any questions or concerns?: No  Items Reviewed: Did you receive and understand the discharge instructions provided?: Yes Medications obtained and verified?: Yes (Medications Reviewed) Any new allergies since your discharge?: No Dietary orders reviewed?: Yes Do you have support at home?: Yes  Home Care and Equipment/Supplies: [place] Ordered?: Yes Name of [place]:: [place] set up a time to come to your home?: No Any new equipment or medical supplies ordered?: No  Functional Questionnaire: Do you need assistance with bathing/showering or dressing?: Yes Do you need assistance with meal preparation?: No Do you need assistance with eating?: Yes Do you have difficulty maintaining continence: No Do you need assistance with getting out of bed/getting out of a chair/moving?: No Do you have difficulty managing or taking your medications?: No  Follow up appointments reviewed: PCP Follow-up appointment confirmed?: No (pt needs hosp fu by 11-18-22- no available appts- will ask front desk to schedule fu appt) MD Provider Line Number:[phone] Given: Yes Follow-up Provider: [name] [facility] Follow-up appointment confirmed?: Yes Date of Specialist follow-up appointment?: 11/09/22 Follow-Up Specialty Provider:: Dr Jacqualyn Posey- Heart and vascular 11-19-22 Do you need transportation to your follow-up appointment?: No Do you understand care  options if your condition(s) worsen?: Yes-patient verbalized understanding    [place] LPN [place] Direct Dial [phone]

## 2022-11-09 ENCOUNTER — Ambulatory Visit: Payer: Medicare PPO | Admitting: Podiatry

## 2022-11-18 LAB — COMPREHENSIVE METABOLIC PANEL
Albumin: 3.6 (ref 3.5–5.0)
Calcium: 9.5 (ref 8.7–10.7)
eGFR: 19

## 2022-11-18 LAB — CBC AND DIFFERENTIAL: Hemoglobin: 9 — AB (ref 12.0–16.0)

## 2022-11-18 LAB — BASIC METABOLIC PANEL
BUN: 49 — AB (ref 4–21)
CO2: 23 — AB (ref 13–22)
Chloride: 111 — AB (ref 99–108)
Creatinine: 2.6 — AB (ref 0.5–1.1)
Glucose: 113
Potassium: 4.2 mEq/L (ref 3.5–5.1)
Sodium: 140 (ref 137–147)

## 2022-11-18 LAB — IRON,TIBC AND FERRITIN PANEL
%SAT: 38
Ferritin: 1112
Iron: 81
TIBC: 214
UIBC: 133

## 2022-11-19 ENCOUNTER — Ambulatory Visit (HOSPITAL_COMMUNITY)
Admit: 2022-11-19 | Discharge: 2022-11-19 | Disposition: A | Discharge status: Home / Self Care | Payer: Medicare PPO | Attending: Cardiology | Admitting: Cardiology

## 2022-11-19 ENCOUNTER — Encounter (HOSPITAL_COMMUNITY): Payer: Self-pay

## 2022-11-19 VITALS — BP 100/66 | HR 98 | Wt 106.2 lb

## 2022-11-19 DIAGNOSIS — Z7952 Long term (current) use of systemic steroids: Secondary | ICD-10-CM | POA: Insufficient documentation

## 2022-11-19 DIAGNOSIS — I428 Other cardiomyopathies: Secondary | ICD-10-CM | POA: Diagnosis not present

## 2022-11-19 DIAGNOSIS — I959 Hypotension, unspecified: Secondary | ICD-10-CM | POA: Diagnosis not present

## 2022-11-19 DIAGNOSIS — I4891 Unspecified atrial fibrillation: Secondary | ICD-10-CM | POA: Diagnosis not present

## 2022-11-19 DIAGNOSIS — D472 Monoclonal gammopathy: Secondary | ICD-10-CM | POA: Diagnosis not present

## 2022-11-19 DIAGNOSIS — Z7901 Long term (current) use of anticoagulants: Secondary | ICD-10-CM | POA: Insufficient documentation

## 2022-11-19 DIAGNOSIS — I5022 Chronic systolic (congestive) heart failure: Secondary | ICD-10-CM | POA: Diagnosis not present

## 2022-11-19 DIAGNOSIS — N184 Chronic kidney disease, stage 4 (severe): Secondary | ICD-10-CM | POA: Diagnosis not present

## 2022-11-19 DIAGNOSIS — M069 Rheumatoid arthritis, unspecified: Secondary | ICD-10-CM | POA: Insufficient documentation

## 2022-11-19 DIAGNOSIS — Z79899 Other long term (current) drug therapy: Secondary | ICD-10-CM | POA: Insufficient documentation

## 2022-11-19 DIAGNOSIS — D649 Anemia, unspecified: Secondary | ICD-10-CM | POA: Diagnosis not present

## 2022-11-19 DIAGNOSIS — Z8249 Family history of ischemic heart disease and other diseases of the circulatory system: Secondary | ICD-10-CM | POA: Diagnosis not present

## 2022-11-19 NOTE — Patient Instructions (Addendum)
Thank you for coming in today  Labs were done today, if any labs are abnormal the clinic will call you No news is good news  Medications: No changes  Follow up appointments:  Your physician recommends that you schedule a follow-up appointment in:  2 months with [name]    Do the following things EVERYDAY: Weigh yourself in the morning before breakfast. Write it down and keep it in a log. Take your medicines as prescribed Eat low salt foods--Limit salt (sodium) to 2000 mg per day.  Stay as active as you can everyday Limit all fluids for the day to less than 2 liters   At the [facility], you and your health needs are our priority. As part of our continuing mission to provide you with exceptional heart care, we have created designated Provider Care Teams. These Care Teams include your primary Cardiologist (physician) and Advanced Practice Providers (APPs- Physician Assistants and Nurse Practitioners) who all work together to provide you with the care you need, when you need it.   You may see any of the following providers on your designated Care Team at your next follow up: Dr Arvilla Meres Dr Marca Ancona Dr. Marcos Eke, NP Robbie Lis, [place] [facility] [place], [place] Brynda Peon, NP Karle Plumber, PharmD   Please be sure to bring in all your medications bottles to every appointment.    Thank you for choosing [facility]  If you have any questions or concerns before your next appointment please send [place] a message through [place] or call our office at [phone].    TO LEAVE A MESSAGE FOR THE NURSE SELECT OPTION 2, PLEASE LEAVE A MESSAGE INCLUDING: YOUR NAME DATE OF BIRTH CALL BACK NUMBER REASON FOR CALL**this is important as we prioritize the call backs  YOU WILL RECEIVE A CALL BACK THE SAME DAY AS LONG AS YOU CALL BEFORE 4:00 PM

## 2022-11-19 NOTE — Progress Notes (Signed)
[facility] Progress Note   PCP: [name], [name] Nephrology: [name] HF Cardiologist: [name]  77 y.o. with history of rheumatoid arthritis, CKD stage 3, and nonischemic cardiomyopathy was referred by [name] in [place] to establish heart failure care in [place].  Patient has been known to have a cardiomyopathy since 2015.  Cardiac MRI in 2015 showed EF 37% with no LGE.  Coronary angiography at that time showed no significant coronary disease.  Over the next few years, LV EF fluctuated up and down.  In 3/21, echo showed EF down to 20-25%.  She was admitted to the hospital in [place], [place] in 11/21 with CHF exacerbation.  Echo showed EF 20-25%, global hypokinesis, mildly decreased RV systolic function.  Creatinine was noted to be elevated. She was diuresed and sent home, but was readmitted later in 11/21 with ongoing volume overload. Creatinine was up to 2.89.  She was taken off most of her cardiac meds due to soft BP and elevated creatinine. She was discharged to her daughter's house in [place].   Repeated an echo on her in 12/21, EF < 20% with moderate LV dilation and mildly decreased RV systolic function. RHC was done, surprisingly showing low filling pressures and relatively preserved cardiac index at 2.22.   She was admitted in 3/22 with atrial fibrillation, converted to NSR on amiodarone. She went into atrial flutter after this and had TEE-DCCV in 4/22.  TEE showed EF < 20%, moderate LV dilation, mild RV dilation with moderate RV dysfunction, moderate central MR.  CPX in 3/22 showed severe HF limitation.  PYP scan 4/22 was grade 1 with H/CL 1.48, equivocal.  PYP scan repeated in 3/23 and likely negative.   She was evaluated for [facility] or ANTHEM, BNP too high and thought to be too frail.   Cardiac MRI in 10/22 showed mild LV dilation with EF 23%, normal RV size with RVEF 37%, ECV 34%, small area of subendocardial LGE in the mid inferolateral wall  and small area of full thickness LGE in the basal  inferior wall.   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    Follow up 6/23, BP low and midodrine started. Discussed [place]. Felt to be near end-stage HF.  Admitted 07/08/22 after mechanical fall resulting in left hip fracture. S/P left intramedullary nail 07/10/22.  Post op developed AKI. Given IV fluids . Diuretics adjusted. Echo showed EF improved to 45%. Discharged to [facility].   Admitted in 1/24 with acute on chronic systolic CHF and AKI. She required milrinone, RHC showed CI 2.81 on milrinone 0.25.  Echo showed EF < 20%.  Suspected end stage HF.   Echo 3/24 showed EF 20-25%, mild LV dilation, severely decreased RV systolic function, mild RV enlargement, severe biatrial enlargement, moderate-severe TR, moderate MR, IVC normal.   She was admitted 3/13-3/24 w/ symptomatic anemia and dyspepsia. Hgb 6.8,  transfused 2 uRBCs. GI recommended conservative management. There was no overt blood loss-FOBT was negative-gastroenterology did not feel endoscopic evaluation was warranted. She was started on Protonix w/ improvement of symptoms and relieve of  dyspepsia. Hgb remained stable post transfusion. Hospital course was c/b by volume overload. AHF team consulted and she was diuresed w/ IV Lasix, then transitioned back to Bumex.   She returns today for post hospital f/u. Wt has been stable since discharge. ReDs 29%. No resting dyspnea. Notes chronic exertional fatigue. No LEE, orthopnea/PND. Denies any further dyspepsia. She  had f/u w/ nephrology yesterday and labs were done (copy of results requested). She has been set up for scheduled Fe infusions. Compliant w/ meds. BP ok 100/66. No orthostatic symptoms.   ECG: NSR, PVC, poor RWP.  Labs (12/21): K 5 => 3.7, creatinine 2.89 => 1.9 => 1.69, BNP 2904 Labs (1/22): K 4, creatinine 1.9, hgb 12.5 Labs (3/22): K 3.6, creatinine 2.36, hgb 11.6, TSH normal Labs  (4/22): K 3.7, creatinine 2.25, LFTs normal, urine immunofixation normal, pro-BNP 6498 Labs (7/22): Myeloma panel with IgG monoclonal protein Labs (10/22): K 4.1, creatinine 1.96 Labs (12/22): BNP 549, TSH normal, K 4.3, creatinine 1.78 Labs (1/23): SCr 2.61, K 4.0, Hgb 11.4  Lab (3/23): Scr 2.29, K 3.9  Labs (6/23): K 4.1, creatinine 3.82, normal LFTs, normal TSH, hgb 9.4 Labs (07/15/22): K 4.4 Creatinine 2.78  Labs (2/24): K 4.1, creatinine 2.76, hgb 8.3  PMH: 1. GERD 2. Rheumatoid arthritis: Followed by rheumatology in [place].  - No known pulmonary disease from RA.  3. Osteoporosis. 4. Gout 5. CKD stage 4 6. H/o SVT 7. Chronic systolic CHF: She was found to have nonischemic cardiomyopathy with low EF in 2015.  - LHC (2015): Normal coronaries.  - cardiac MRI (2015): EF 37%, no LGE - Echo (3/21): EF 20-25%. - Echo (11/21): EF 20-25%, moderate LV dilation, global hypokinesis, biatrial enlargement, mildly decreased RV systolic function.  - Echo (12/21): EF < 20%, moderate LV dilation, normal RV size/systolic function, moderate MR, IVC normal.  - RHC (1/22): Mean RA 1, PA 39/14, mean PCWP 10, PVR 3.9 WU, CI 2.22.  - CPX (3/22): peak VO2 10.1, VE/VCO2 55, RER 1.13.  Severe functional limitation due to HF.  - TEE (4/22): EF < 20%, moderate LV dilation, mild RV dilation with moderate RV dysfunction, moderate central MR. - PYP scan (4/22): grade 1 with H/CL 1.48. Equivocal. [employer] gene testing negative. - Cardiac MRI (10/22): mild LV dilation with EF 23%, normal RV size with RVEF 37%, ECV 34%, small area of subendocardial LGE in the mid inferolateral wall and small area of full thickness LGE in the basal inferior wall.  - PYP scan (3/23): grade 1, H/CL 1.3.  Probably negative.  - Echo 07/11/22: EF 45-50% RV normal  - Echo (3/24): EF 20-25%, mild LV dilation, severely decreased RV systolic function, mild RV enlargement, severe biatrial enlargement, moderate-severe TR, moderate MR, IVC  normal.  8. Atrial fibrillation/flutter: TEE-DCCV in 4/22.  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  10. Varicella zoster with post-herpetic neuralgia 11. L hip fracture , mechanical fall.  12. Anemia  SH: From [place] but recently moved to [place] to live with daughter.  Has 3 children.  Nonsmoker.  No ETOH.   FH: Mother with CHF, father with MI.   ROS: All systems reviewed and negative except as per HPI.   Current Outpatient Medications  Medication Sig Dispense Refill   amiodarone (PACERONE) 200 MG tablet TAKE 1 TABLET BY MOUTH EVERY DAY 60 tablet 6   bumetanide (BUMEX) 1 MG tablet Take 1 tablet (1 mg total) by mouth daily.     calcitRIOL (ROCALTROL) 0.25 MCG capsule Take 0.25 mcg by mouth daily.     ELIQUIS 2.5 MG TABS tablet TAKE 1 TABLET BY MOUTH TWICE A DAY (Patient taking differently: Take 2.5 mg by mouth 2 (two) times daily.) 60 tablet 5   Ensure (ENSURE) Take 237 mLs by mouth daily as needed (for meal supplement).     folic acid (  FOLVITE) 1 MG tablet TAKE 1 TABLET BY MOUTH EVERY DAY 30 tablet 11   hydroxypropyl methylcellulose / hypromellose (ISOPTO TEARS / GONIOVISC) 2.5 % ophthalmic solution Place 1 drop into both eyes as needed for dry eyes.     metoCLOPramide (REGLAN) 5 MG tablet Take 1 tablet (5 mg total) by mouth every 8 (eight) hours as needed for nausea or vomiting. 30 tablet 0   Multiple Vitamin (MULTIVITAMIN) tablet Take 1 tablet by mouth daily.     omeprazole (PRILOSEC) 40 MG capsule Take 1 capsule (40 mg total) by mouth 2 (two) times daily. 60 capsule 1   polyethylene glycol (MIRALAX / GLYCOLAX) 17 g packet Take 17 g by mouth daily as needed for mild constipation.     predniSONE (DELTASONE) 5 MG tablet Take 1 tablet (5 mg total) by mouth daily with breakfast. 90 tablet 1   Vericiguat (VERQUVO) 10 MG TABS Take 1 tablet (10 mg total) by mouth daily. 90 tablet 3   No current facility-administered medications for this encounter.   Wt Readings  from Last 3 Encounters:  11/19/22 48.2 kg (106 lb 3.2 oz)  11/03/22 49.9 kg (110 lb 0.2 oz)  10/29/22 47.4 kg (104 lb 6.4 oz)   BP 100/66   Pulse 98   Wt 48.2 kg (106 lb 3.2 oz)   SpO2 99%   BMI 18.23 kg/m  ReDs 29%  PHYSICAL EXAM: General:  thin/ frail and chronically ill appearing. No respiratory difficulty HEENT: normal Neck: supple. no JVD. Carotids 2+ bilat; no bruits. No lymphadenopathy or thyromegaly appreciated. Cor: PMI nondisplaced. Regular rate & rhythm. No rubs, gallops or murmurs. Lungs: clear Abdomen: soft, nontender, nondistended. No hepatosplenomegaly. No bruits or masses. Good bowel sounds. Extremities: no cyanosis, clubbing, rash, edema Neuro: alert & oriented x 3, cranial nerves grossly intact. moves all 4 extremities w/o difficulty. Affect pleasant.    Assessment/Plan: 1. Chronic Systolic CHF: Nonischemic cardiomyopathy.  This has been known since 2015, cath in 2015 showed no coronary disease and cardiac MRI in 2015 showed no LGE.  Cause is uncertain, familial cardiomyopathy is a concern given nonischemic cardiomyopathy in her mother.  Cannot rule out remote viral myocarditis.Poor appetite and severe HF limitation on CPX in 3/22 is concerning, as is elevated creatinine (suspect cardiorenal syndrome).  Low BP and elevated creatinine have limited her cardiac med regimen. RHC (1/22) showed low filling pressures and actually a relatively preserved cardiac index of 2.22.  PYP scan in 4/22 was equivocal, PYP scan repeated in 3/23 and likely negative.  Cardiac MRI in 10/22 showed mild LV dilation with EF 23%, normal RV size with RVEF 37%, ECV 34%, small area of subendocardial LGE in the mid inferolateral wall and small area of full thickness LGE in the basal inferior wall.   This was not suggestive of cardiac amyloidosis but possibly could be consistent with coronary embolism (though no LV thrombus visualized) versus prior myocarditis versus sarcoidosis. With renal failure, CHF,  and atrial arrhythmias, we have assessed for cardiac amyloidosis.  PYP scan was equivocal in 4/22 for transthyretin cardiac amyloidosis.  It was repeated in 3/23 and likely negative.  [employer] gene testing for TTR mutations was negative. The cardiac MRI was not consistent with amyloidosis.  She has MGUS with monoclonal IgG paraprotein, but based on cMRI and slow progression, think AL amyloidosis is unlikely.  Most recent echo 3/24, EF 20-25%, mild LV dilation, severely decreased RV systolic function, mild RV enlargement, severe biatrial enlargement, moderate-severe TR, moderate MR, IVC  normal. She was admitted in 1/24 and required milrinone.  Chronically NYHA class IIIb symptoms and CKD stage IV.  Suspect she is nearing end-stage.  Not a candidate for advanced therapies.  She is euvolemic on exam today. ReDs 29%.  GDMT limited by CKD stage IV and low BP.  - Continue Bumex 1 mg daily.  - She no longer requires midodrine.  - Continue Verquvo 10 mg daily.  - Off dapagliflozin with rise in creatinine, with low BP would not restart.   - Off spironolactone with elevated creatinine and soft BP.  - Does not have an ICD, would not place with likely end stage.   2. CKD: Stage 4.  She is followed by [name]. Had f/u yesterday and labs were done (have requested copy of results) 3. Rheumatoid arthritis: No history of lung involvement. She has been on a low dose of prednisone chronically. No off Imuran, felt to be contributing to anemia  - needs f/u w/ Dr. Dimple Casey  4. Atrial fibrillation/flutter: S/p TEE-DCCV in 4/22.  She is now on amiodarone and in NSR.  - Continue amiodarone 200 mg daily.  Recent LFTs and TSH WNL (3/24).  Will need regular eye exam.  - Continue Eliquis 5 mg bid. No gross bleeding but recent anemia, likely 2/2 anemia of chronic disease from CKD. Labs followed by nephrology. Getting scheduled Fe infusions.  5. Anemia: per above. Labs were checked at nephrology clinic yesterday. Copy of results  requested - nephrology to manage iron infusions and follow labs    F/u w/ APP in 2 months   Albion Weatherholtz Sharol Harness, PA-C   11/19/2022

## 2022-11-19 NOTE — Progress Notes (Signed)
ReDS Vest / Clip - 11/19/22 1600       ReDS Vest / Clip   Station Marker A    Ruler Value 23.5    ReDS Value Range Low volume    ReDS Actual Value 29

## 2022-11-24 ENCOUNTER — Telehealth: Payer: Self-pay

## 2022-11-24 NOTE — Telephone Encounter (Signed)
Patient called to complain she is having lower back pain and muscle spasms x2 days. She denies any known injury/strain to the area; denies urinary or bowel symptoms; denies radiating pain to the LE. States the pain increases with movement; spasms seem to come and go. She has taken 4/500mg  Tylenol today with no relief. She would like to know if you could send in something to help with the muscle spasms.   Please advise, thanks!

## 2022-11-25 ENCOUNTER — Other Ambulatory Visit: Payer: Self-pay

## 2022-11-25 ENCOUNTER — Encounter (HOSPITAL_BASED_OUTPATIENT_CLINIC_OR_DEPARTMENT_OTHER): Payer: Self-pay

## 2022-11-25 ENCOUNTER — Emergency Department (HOSPITAL_BASED_OUTPATIENT_CLINIC_OR_DEPARTMENT_OTHER)
Admission: EM | Admit: 2022-11-25 | Discharge: 2022-11-25 | Disposition: A | Discharge status: Home / Self Care | Payer: Medicare PPO | Attending: Emergency Medicine | Admitting: Emergency Medicine

## 2022-11-25 DIAGNOSIS — I509 Heart failure, unspecified: Secondary | ICD-10-CM | POA: Insufficient documentation

## 2022-11-25 DIAGNOSIS — Z79899 Other long term (current) drug therapy: Secondary | ICD-10-CM | POA: Insufficient documentation

## 2022-11-25 DIAGNOSIS — S39012A Strain of muscle, fascia and tendon of lower back, initial encounter: Secondary | ICD-10-CM | POA: Insufficient documentation

## 2022-11-25 DIAGNOSIS — S3992XA Unspecified injury of lower back, initial encounter: Secondary | ICD-10-CM | POA: Diagnosis present

## 2022-11-25 DIAGNOSIS — N189 Chronic kidney disease, unspecified: Secondary | ICD-10-CM | POA: Insufficient documentation

## 2022-11-25 DIAGNOSIS — D649 Anemia, unspecified: Secondary | ICD-10-CM | POA: Insufficient documentation

## 2022-11-25 DIAGNOSIS — X58XXXA Exposure to other specified factors, initial encounter: Secondary | ICD-10-CM | POA: Diagnosis not present

## 2022-11-25 DIAGNOSIS — I130 Hypertensive heart and chronic kidney disease with heart failure and stage 1 through stage 4 chronic kidney disease, or unspecified chronic kidney disease: Secondary | ICD-10-CM | POA: Insufficient documentation

## 2022-11-25 DIAGNOSIS — Z7901 Long term (current) use of anticoagulants: Secondary | ICD-10-CM | POA: Insufficient documentation

## 2022-11-25 LAB — CBC WITH DIFFERENTIAL/PLATELET
Abs Immature Granulocytes: 0.1 10*3/uL — ABNORMAL HIGH (ref 0.00–0.07)
Basophils Absolute: 0 10*3/uL (ref 0.0–0.1)
Basophils Relative: 1 %
Eosinophils Absolute: 0 10*3/uL (ref 0.0–0.5)
Eosinophils Relative: 1 %
HCT: 29 % — ABNORMAL LOW (ref 36.0–46.0)
Hemoglobin: 9.4 g/dL — ABNORMAL LOW (ref 12.0–15.0)
Immature Granulocytes: 1 %
Lymphocytes Relative: 17 %
Lymphs Abs: 1.2 10*3/uL (ref 0.7–4.0)
MCH: 30.9 pg (ref 26.0–34.0)
MCHC: 32.4 g/dL (ref 30.0–36.0)
MCV: 95.4 fL (ref 80.0–100.0)
Monocytes Absolute: 0.7 10*3/uL (ref 0.1–1.0)
Monocytes Relative: 9 %
Neutro Abs: 5.3 10*3/uL (ref 1.7–7.7)
Neutrophils Relative %: 71 %
Platelets: 181 10*3/uL (ref 150–400)
RBC: 3.04 MIL/uL — ABNORMAL LOW (ref 3.87–5.11)
RDW: 19.9 % — ABNORMAL HIGH (ref 11.5–15.5)
WBC: 7.4 10*3/uL (ref 4.0–10.5)
nRBC: 0 % (ref 0.0–0.2)

## 2022-11-25 LAB — URINALYSIS, ROUTINE W REFLEX MICROSCOPIC
Bilirubin Urine: NEGATIVE
Glucose, UA: NEGATIVE mg/dL
Hgb urine dipstick: NEGATIVE
Ketones, ur: NEGATIVE mg/dL
Nitrite: NEGATIVE
Specific Gravity, Urine: 1.016 (ref 1.005–1.030)
pH: 5 (ref 5.0–8.0)

## 2022-11-25 LAB — TROPONIN I (HIGH SENSITIVITY)
Troponin I (High Sensitivity): 23 ng/L — ABNORMAL HIGH (ref <18)
Troponin I (High Sensitivity): 24 ng/L — ABNORMAL HIGH (ref <18)

## 2022-11-25 LAB — BASIC METABOLIC PANEL
Anion gap: 10 (ref 5–15)
BUN: 66 mg/dL — ABNORMAL HIGH (ref 8–23)
CO2: 20 mmol/L — ABNORMAL LOW (ref 22–32)
Calcium: 9.4 mg/dL (ref 8.9–10.3)
Chloride: 107 mmol/L (ref 98–111)
Creatinine, Ser: 2.98 mg/dL — ABNORMAL HIGH (ref 0.44–1.00)
GFR, Estimated: 16 mL/min — ABNORMAL LOW (ref >60)
Glucose, Bld: 104 mg/dL — ABNORMAL HIGH (ref 70–99)
Potassium: 3.9 mmol/L (ref 3.5–5.1)
Sodium: 137 mmol/L (ref 135–145)

## 2022-11-25 MED ORDER — MORPHINE SULFATE (PF) 4 MG/ML IV SOLN
4.0000 mg | Freq: Once | INTRAVENOUS | Status: AC
  Administered 2022-11-25: 4 mg via INTRAVENOUS
  Filled 2022-11-25: qty 1

## 2022-11-25 MED ORDER — TRAMADOL HCL 50 MG PO TABS: 50.0000 mg | ORAL_TABLET | Freq: Four times a day (QID) | ORAL | 0 refills | Status: DC | PRN

## 2022-11-25 MED ORDER — METHOCARBAMOL 500 MG PO TABS: 500.0000 mg | ORAL_TABLET | Freq: Two times a day (BID) | ORAL | 0 refills | Status: DC | PRN

## 2022-11-25 NOTE — Telephone Encounter (Signed)
Spoke with patient and offered OV today. Patient states she is on her way to the ED at [facility]. Provider advised.

## 2022-11-25 NOTE — ED Provider Notes (Signed)
[place] EMERGENCY DEPARTMENT AT [facility] ([facility]) Provider Note   CSN: [id number] Arrival date & time: 11/25/22  1502     History  Chief Complaint  Patient presents with   Back Pain    Tracy Nelson is a 77 y.o. female.  Pt is a 77 yo female with pmhx significant for RA, GERD, CHF with EF 20-25%, afib (on Eliquis), HTN, CKD, and anemia.  Pt was admitted in March with symptomatic anemia.  She was transfused which cause volume overload.  She was diuresed and d/c.  She did f/u with cards on 4/11 and has been doing well.  She has been taking her meds. SBP today is 102, which is her normal.  She comes to the ED today with back pain.  She feels like they are spasms.  It hurts a lot when she moves.  She has some sob and cp.  She thinks those are chronic issues.  She denies urinary sx.  No abd pain.        Home Medications Prior to Admission medications   Medication Sig Start Date End Date Taking? Authorizing Provider  methocarbamol (ROBAXIN) 500 MG tablet Take 1 tablet (500 mg total) by mouth 2 (two) times daily as needed for muscle spasms. 11/25/22  Yes Jacalyn Lefevre, MD  traMADol (ULTRAM) 50 MG tablet Take 1 tablet (50 mg total) by mouth every 6 (six) hours as needed. 11/25/22  Yes Jacalyn Lefevre, MD  amiodarone (PACERONE) 200 MG tablet TAKE 1 TABLET BY MOUTH EVERY DAY 12/19/21   Laurey Morale, MD  bumetanide (BUMEX) 1 MG tablet Take 1 tablet (1 mg total) by mouth daily. 09/12/22   Zannie Cove, MD  calcitRIOL (ROCALTROL) 0.25 MCG capsule Take 0.25 mcg by mouth daily. 06/25/22   [provider]  ELIQUIS 2.5 MG TABS tablet TAKE 1 TABLET BY MOUTH TWICE A DAY Patient taking differently: Take 2.5 mg by mouth 2 (two) times daily. 10/05/22   Laurey Morale, MD  Ensure (ENSURE) Take 237 mLs by mouth daily as needed (for meal supplement).    [provider]  folic acid (FOLVITE) 1 MG tablet TAKE 1 TABLET BY MOUTH EVERY DAY 07/22/22   Laurey Morale, MD   hydroxypropyl methylcellulose / hypromellose (ISOPTO TEARS / GONIOVISC) 2.5 % ophthalmic solution Place 1 drop into both eyes as needed for dry eyes.    [provider]  metoCLOPramide (REGLAN) 5 MG tablet Take 1 tablet (5 mg total) by mouth every 8 (eight) hours as needed for nausea or vomiting. 11/04/22   Ghimire, Werner Lean, MD  Multiple Vitamin (MULTIVITAMIN) tablet Take 1 tablet by mouth daily.    [provider]  omeprazole (PRILOSEC) 40 MG capsule Take 1 capsule (40 mg total) by mouth 2 (two) times daily. 11/04/22   Ghimire, Werner Lean, MD  polyethylene glycol (MIRALAX / GLYCOLAX) 17 g packet Take 17 g by mouth daily as needed for mild constipation.    [provider]  predniSONE (DELTASONE) 5 MG tablet Take 1 tablet (5 mg total) by mouth daily with breakfast. 09/29/22   Rice, Jamesetta Orleans, MD  Vericiguat (VERQUVO) 10 MG TABS Take 1 tablet (10 mg total) by mouth daily. 10/09/22   Laurey Morale, MD      Allergies    Baclofen, Penicillin g, and Penicillins    Review of Systems   Review of Systems  Musculoskeletal:  Positive for back pain.  All other systems reviewed and are negative.  Physical Exam Updated Vital Signs BP 104/73   Pulse 73   Temp 97.9 F (36.6 C)   Resp 16   Ht 5\' 4"  (1.626 m)   Wt 45.4 kg   SpO2 96%   BMI 17.16 kg/m  Physical Exam Vitals and nursing note reviewed.  Constitutional:      Appearance: Normal appearance.  HENT:     Head: Normocephalic and atraumatic.     Right Ear: External ear normal.     Left Ear: External ear normal.     Nose: Nose normal.     Mouth/Throat:     Mouth: Mucous membranes are moist.     Pharynx: Oropharynx is clear.  Eyes:     Extraocular Movements: Extraocular movements intact.     Conjunctiva/sclera: Conjunctivae normal.     Pupils: Pupils are equal, round, and reactive to light.  Cardiovascular:     Rate and Rhythm: Normal rate and regular rhythm.     Pulses: Normal pulses.     Heart  sounds: Normal heart sounds.  Pulmonary:     Effort: Pulmonary effort is normal.     Breath sounds: Normal breath sounds.  Abdominal:     General: Abdomen is flat. Bowel sounds are normal.     Palpations: Abdomen is soft.  Musculoskeletal:       Arms:     Cervical back: Normal range of motion and neck supple.     Comments: No rash  Skin:    General: Skin is warm.     Capillary Refill: Capillary refill takes less than 2 seconds.  Neurological:     General: No focal deficit present.     Mental Status: She is alert and oriented to person, place, and time.  Psychiatric:        Mood and Affect: Mood normal.        Behavior: Behavior normal.     ED Results / Procedures / Treatments   Labs (all labs ordered are listed, but only abnormal results are displayed) Labs Reviewed  BASIC METABOLIC PANEL - Abnormal; Notable for the following components:      Result Value   CO2 20 (*)    Glucose, Bld 104 (*)    BUN 66 (*)    Creatinine, Ser 2.98 (*)    GFR, Estimated 16 (*)    All other components within normal limits  CBC WITH DIFFERENTIAL/PLATELET - Abnormal; Notable for the following components:   RBC 3.04 (*)    Hemoglobin 9.4 (*)    HCT 29.0 (*)    RDW 19.9 (*)    Abs Immature Granulocytes 0.10 (*)    All other components within normal limits  URINALYSIS, ROUTINE W REFLEX MICROSCOPIC - Abnormal; Notable for the following components:   Protein, ur TRACE (*)    Leukocytes,Ua MODERATE (*)    Bacteria, UA RARE (*)    All other components within normal limits  TROPONIN I (HIGH SENSITIVITY) - Abnormal; Notable for the following components:   Troponin I (High Sensitivity) 23 (*)    All other components within normal limits  TROPONIN I (HIGH SENSITIVITY) - Abnormal; Notable for the following components:   Troponin I (High Sensitivity) 24 (*)    All other components within normal limits    EKG EKG Interpretation  Date/Time:  Wednesday November 25 2022 15:34:12 EDT Ventricular  Rate:  77 PR Interval:  214 QRS Duration: 116 QT Interval:  418 QTC Calculation: 474 R Axis:   -38 Text Interpretation:  Age not entered, assumed to be  77 years old for purpose of ECG interpretation Sinus rhythm Prolonged PR interval Nonspecific IVCD with LAD Anterior infarct, old Nonspecific T abnormalities, lateral leads No significant change since last tracing Confirmed by Jacalyn Lefevre [phone]) on 11/25/2022 3:51:37 PM  Radiology No results found.  Procedures Procedures    Medications Ordered in ED Medications  morphine (PF) 4 MG/ML injection 4 mg (4 mg Intravenous Given 11/25/22 1544)    ED Course/ Medical Decision Making/ A&P                             Medical Decision Making Amount and/or Complexity of Data Reviewed Labs: ordered.  Risk Prescription drug management.   This patient presents to the ED for concern of back pain, this involves an extensive number of treatment options, and is a complaint that carries with it a high risk of complications and morbidity.  The differential diagnosis includes msk, urinary, aorta   Co morbidities that complicate the patient evaluation  RA, GERD, CHF with EF 20-25%, afib (on Eliquis), HTN, CKD, and anemia   Additional history obtained:  Additional history obtained from epic chart review External records from outside source obtained and reviewed including family   Lab Tests:  I Ordered, and personally interpreted labs.  The pertinent results include:  cbc with hgb 9.4 (chronic), bmp with bun 66 and cr 2.98 (chronic), trop 23    Cardiac Monitoring:  The patient was maintained on a cardiac monitor.  I personally viewed and interpreted the cardiac monitored which showed an underlying rhythm of: nsr   Medicines ordered and prescription drug management:  I ordered medication including morphine  for sx  Reevaluation of the patient after these medicines showed that the patient improved I have reviewed the patients home  medicines and have made adjustments as needed   Test Considered:  ct   Critical Interventions:  Pain control    Problem List / ED Course:  Back pain:  likely lumbar strain.  Pain with movement.  Family said she has not been very active.  Pt d/c with a short course of tramadol and robaxin.  She is encouraged to get up more and walk.  She is given back exercises.  Return if worse.  F/u with pcp.   Reevaluation:  After the interventions noted above, I reevaluated the patient and found that they have :improved   Social Determinants of Health:  Lives at home   Dispostion:  After consideration of the diagnostic results and the patients response to treatment, I feel that the patent would benefit from discharge with outpatient f/u.          Final Clinical Impression(s) / ED Diagnoses Final diagnoses:  Strain of lumbar region, initial encounter    Rx / DC Orders ED Discharge Orders          Ordered    traMADol (ULTRAM) 50 MG tablet  Every 6 hours PRN        11/25/22 1818    methocarbamol (ROBAXIN) 500 MG tablet  2 times daily PRN        11/25/22 1818              Jacalyn Lefevre, MD 11/25/22 1820

## 2022-11-25 NOTE — ED Triage Notes (Signed)
Patient arrives to ED POV C/O lower back pain x3 days. Denies fall. Pt states that she has been taking Tylenol and using heating pads with no relief. Describes pain as "spasms". No other complaints at this time. Pt A/O x4.

## 2022-11-25 NOTE — ED Notes (Signed)
Pt given discharge instructions and reviewed prescriptions. Opportunities given for questions. Pt verbalizes understanding. PIV removed x1. Engelbert Sevin R, RN 

## 2022-11-25 NOTE — Telephone Encounter (Signed)
Patient called to follow up on message from yesterday.

## 2022-11-25 NOTE — Telephone Encounter (Signed)
She needs to be seen for an office visit I have not seen her since she was in the hospital.

## 2022-11-27 ENCOUNTER — Telehealth (HOSPITAL_COMMUNITY): Payer: Self-pay

## 2022-11-27 NOTE — Telephone Encounter (Signed)
I spoke with patient and updated on medication changes. She verbalized understanding.

## 2022-11-27 NOTE — Telephone Encounter (Signed)
Patient called and stated she is having some increased swelling in her feet and ankles. She denies weight gain or shortness of breath. She wants to know if she needs to take extra Bumex?

## 2022-12-07 NOTE — Progress Notes (Unsigned)
Office Visit Note  Patient: Tracy Nelson             Date of Birth: Mar 23, 1946           MRN: [id number]             PCP: [name], [name] Referring: [name], [name] Visit Date: 12/08/2022   Subjective:  No chief complaint on file.   History of Present Illness: Tracy Nelson is a 77 y.o. female here for follow up ***   Previous HPI 09/29/22 Tracy Nelson is a 77 y.o. 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  Hydroxychloroquine was previously discontinued that she took a much of last year but had concern by ophthalmologist for safety of retinal toxicity monitoring.  She is recently out of the prednisone due to medication refill.  Since stopping the medication she is noticing substantial increase in fatigue and joint pain and stiffness worse in multiple areas.  Especially bilateral upper extremities doing worse.   Previous HPI 09/23/21 Tracy Nelson is a 77 y.o. female here for follow up for seronegative RA complicated with corneal ulcerations we increased azathioprine dose to 75 mg daily and increased prednisone temporarily. I discussed plan with Dr. Noel Gerold also planning to discontinue hydroxychloroquine over concern about risk and ability to monitor safety. She continues to have eye inflammation worse on the left side. She notices some eye pain and headaches associated with blurry vision. Often around using eye drops or first thing in the morning. She had lab work last month for Dr. Ronelle Nigh with normal blood counts renal function appeared slightly worse.   Previous HPI 08/20/21 Tracy Nelson is a 77 y.o. female here for follow up for seronegative RA on HCQ 200 mg daily and prednisone 5 mg daily. Labs checked at hospital December 2022 with eGFR stable at 29. Joint symptoms are not significantly changed since last visit. However she saw  ophthalmology in December with increased eye irritation and redness found to have ulceration in the left eye consistent with active RA. She also continued restasis and artificial tears for severe dryness. She reports seeing some darkening or rash on her face does not have any abnormal sensation.   Previous HPI 01/09/21 Tracy Nelson is a 77 y.o. female here for seronegative rheumatoid arthritis previously a patient with Dr. Steele Berg in [place], [place]. She currently taken hydroxychloroquine 200 mg PO daily and prednisone 5 mg PO daily and has not experienced a severe flare or increase in steroid dosing since about a year ago.  She also experiences sicca symptoms currently treated with Restasis.  She was previously taking Imuran but is not clear if she is still taking this and how much is contributory to disease control. 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  She has also previously undergone some fibromyalgia tender point injection with significant benefit of back pain symptoms. 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 She has upcoming nephrology appointment next week for her CKD and due to frequent hard sticks prefers if lab draws may be consolidated with frequent bruising and discomfort associated.   No Rheumatology ROS completed.   PMFS History:  Patient Active Problem List   Diagnosis Date Noted   Abdominal discomfort 11/02/2022   Belching 11/02/2022   Acute on chronic  anemia 10/30/2022   Hypokalemia 10/30/2022   Prolonged QT interval 10/30/2022   Cataract 09/14/2022   Neoplasm of uncertain behavior of skin 09/14/2022   Uterine prolapse 09/14/2022   Congestive heart failure (HCC) 09/03/2022   Pressure ulcer 09/03/2022   Acute on chronic HFrEF (heart failure with reduced ejection fraction) (HCC) 08/30/2022   Elevated d-dimer  08/30/2022   Anemia of chronic disease 08/30/2022   Paroxysmal atrial fibrillation (HCC) 08/30/2022   Chronic systolic CHF (congestive heart failure) (HCC) 07/13/2022   CKD (chronic kidney disease), stage IV (HCC) 07/09/2022   Displaced intertrochanteric fracture of left femur, initial encounter for closed fracture (HCC) 07/08/2022   Corneal melt, 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   Monoclonal gammopathy 03/10/2021   Hypotension 10/22/2020   Atypical chest pain 10/22/2020   CKD (chronic kidney disease), stage III (HCC) 10/22/2020   Nausea & vomiting 10/22/2020   Physical deconditioning 10/22/2020   Atrial flutter (HCC) 10/18/2020   Arthritis 07/15/2020   GERD (gastroesophageal reflux disease) 07/15/2020   Colitis 07/15/2020   Non-ischemic cardiomyopathy (HCC) 07/15/2020   HFrEF (heart failure with reduced ejection fraction) (HCC) 07/15/2020   HTN (hypertension) 07/15/2020   Osteoporosis 07/15/2020   Palpitations 07/15/2020   Acute gout of left shoulder 07/10/2020   Raynaud's disease 05/05/2019   Rheumatoid arthritis involving multiple sites with positive rheumatoid factor (HCC) 10/28/2015   Mitral regurgitation 05/30/2014   Acute on chronic combined systolic (congestive) and diastolic (congestive) heart failure (HCC) 05/19/2014   Vitamin D deficiency 05/15/2013   Encounter for long-term (current) use of other medications 12/14/2008   Sjogren's syndrome (HCC) 12/14/2008    Past Medical History:  Diagnosis Date   Acid reflux 07/15/2020   Arthritis 07/15/2020   CHF (congestive heart failure) (HCC)    Colitis 07/15/2020   Dilated cardiomyopathy (HCC) 07/15/2020   HFrEF (heart failure with reduced ejection fraction) (HCC) 07/15/2020   HTN (hypertension) 07/15/2020   Osteoporosis 07/15/2020   Palpitations 07/15/2020   Rheumatoid arthritis (HCC) 07/15/2020    Family History  Problem Relation Age of Onset   Hypertension Mother    Diabetes Mother    Hypertension Father    Diabetes Father    Breast  cancer Maternal Aunt    Breast cancer Paternal Aunt    Arthritis Maternal Grandmother    Lung disease Paternal Grandfather    Cancer Brother    Past Surgical History:  Procedure Laterality Date   CARDIOVERSION N/A 11/08/2020   Procedure: CARDIOVERSION;  Surgeon: Laurey Morale, MD;  Location: [facility] ENDOSCOPY;  Service: Cardiovascular;  Laterality: N/A;   HIP FRACTURE SURGERY     INTRAMEDULLARY (IM) NAIL INTERTROCHANTERIC Left 07/10/2022   Procedure: INTRAMEDULLARY (IM) NAIL INTERTROCHANTERIC;  Surgeon: Tarry Kos, MD;  Location: [facility] OR;  Service: Orthopedics;  Laterality: Left;   RIGHT HEART CATH N/A 08/14/2020   Procedure: RIGHT HEART CATH;  Surgeon: Laurey Morale, MD;  Location: [facility] INVASIVE CV LAB;  Service: Cardiovascular;  Laterality: N/A;   RIGHT HEART CATH N/A 09/03/2022   Procedure: RIGHT HEART CATH;  Surgeon: Laurey Morale, MD;  Location: [facility] INVASIVE CV LAB;  Service: Cardiovascular;  Laterality: N/A;   TEE WITHOUT CARDIOVERSION N/A 11/08/2020   Procedure: TRANSESOPHAGEAL ECHOCARDIOGRAM (TEE);  Surgeon: Laurey Morale, MD;  Location: [facility] ENDOSCOPY;  Service: Cardiovascular;  Laterality: N/A;   Social History   Social History Narrative   Not on file   There is no immunization history for the selected administration types on file for this patient.   Objective:  Vital Signs: There were no vitals taken for this visit.   Physical Exam   Musculoskeletal Exam: ***  CDAI Exam: CDAI Score: -- Patient Global: --; Provider Global: -- Swollen: --; Tender: -- Joint Exam 12/08/2022   No joint exam has been documented for this visit   There is currently no information documented on the homunculus. Go to the Rheumatology activity and complete the homunculus joint exam.  Investigation: No additional findings.  Imaging: No results found.  Recent Labs: Lab Results  Component Value Date   WBC 7.4 11/25/2022   HGB 9.4 (L) 11/25/2022   PLT 181 11/25/2022   NA 137  11/25/2022   K 3.9 11/25/2022   CL 107 11/25/2022   CO2 20 (L) 11/25/2022   GLUCOSE 104 (H) 11/25/2022   BUN 66 (H) 11/25/2022   CREATININE 2.98 (H) 11/25/2022   BILITOT 0.3 11/04/2022   ALKPHOS 62 11/04/2022   AST 21 11/04/2022   ALT 18 11/04/2022   PROT 4.9 (L) 11/04/2022   ALBUMIN 2.2 (L) 11/04/2022   CALCIUM 9.4 11/25/2022    Speciality Comments: No specialty comments available.  Procedures:  No procedures performed Allergies: Baclofen, Penicillin g, and Penicillins   Assessment / Plan:     Visit Diagnoses: No diagnosis found.  ***  Orders: No orders of the defined types were placed in this encounter.  No orders of the defined types were placed in this encounter.    Follow-Up Instructions: No follow-ups on file.   Fuller Plan, MD  Note - This record has been created using [employer].  Chart creation errors have been sought, but may not always  have been located. Such creation errors do not reflect on  the standard of medical care.

## 2022-12-08 ENCOUNTER — Ambulatory Visit: Payer: Medicare PPO | Attending: Internal Medicine | Admitting: Internal Medicine

## 2022-12-08 ENCOUNTER — Encounter: Payer: Self-pay | Admitting: Internal Medicine

## 2022-12-08 VITALS — BP 93/64 | HR 79 | Resp 14 | Ht 63.0 in | Wt 100.0 lb

## 2022-12-08 DIAGNOSIS — M0579 Rheumatoid arthritis with rheumatoid factor of multiple sites without organ or systems involvement: Secondary | ICD-10-CM

## 2022-12-08 DIAGNOSIS — H16003 Unspecified corneal ulcer, bilateral: Secondary | ICD-10-CM | POA: Diagnosis not present

## 2022-12-08 DIAGNOSIS — N184 Chronic kidney disease, stage 4 (severe): Secondary | ICD-10-CM | POA: Diagnosis not present

## 2022-12-09 ENCOUNTER — Encounter (HOSPITAL_COMMUNITY)
Admission: RE | Admit: 2022-12-09 | Discharge: 2022-12-09 | Disposition: A | Discharge status: Home / Self Care | Payer: Medicare PPO | Source: Ambulatory Visit | Attending: Nephrology | Admitting: Nephrology

## 2022-12-09 VITALS — BP 90/40 | HR 88 | Temp 97.8°F

## 2022-12-09 DIAGNOSIS — N183 Chronic kidney disease, stage 3 unspecified: Secondary | ICD-10-CM | POA: Insufficient documentation

## 2022-12-09 LAB — IRON AND TIBC
Iron: 76 ug/dL (ref 28–170)
Saturation Ratios: 26 % (ref 10.4–31.8)
TIBC: 297 ug/dL (ref 250–450)
UIBC: 221 ug/dL

## 2022-12-09 LAB — POCT HEMOGLOBIN-HEMACUE: Hemoglobin: 9.6 g/dL — ABNORMAL LOW (ref 12.0–15.0)

## 2022-12-09 LAB — FERRITIN: Ferritin: 750 ng/mL — ABNORMAL HIGH (ref 11–307)

## 2022-12-09 MED ORDER — EPOETIN ALFA-EPBX 10000 UNIT/ML IJ SOLN
INTRAMUSCULAR | Status: AC
  Filled 2022-12-09: qty 1

## 2022-12-09 MED ORDER — EPOETIN ALFA-EPBX 10000 UNIT/ML IJ SOLN
10000.0000 [IU] | INTRAMUSCULAR | Status: DC
  Administered 2022-12-09: 10000 [IU] via SUBCUTANEOUS

## 2022-12-10 ENCOUNTER — Encounter (HOSPITAL_COMMUNITY): Payer: Medicare PPO

## 2022-12-23 ENCOUNTER — Encounter (HOSPITAL_COMMUNITY)
Admission: RE | Admit: 2022-12-23 | Discharge: 2022-12-23 | Disposition: A | Discharge status: Home / Self Care | Payer: Medicare PPO | Source: Ambulatory Visit | Attending: Nephrology | Admitting: Nephrology

## 2022-12-23 VITALS — BP 84/65 | HR 90 | Temp 97.4°F | Resp 16

## 2022-12-23 DIAGNOSIS — N183 Chronic kidney disease, stage 3 unspecified: Secondary | ICD-10-CM

## 2022-12-23 LAB — POCT HEMOGLOBIN-HEMACUE: Hemoglobin: 10.7 g/dL — ABNORMAL LOW (ref 12.0–15.0)

## 2022-12-23 MED ORDER — EPOETIN ALFA-EPBX 10000 UNIT/ML IJ SOLN
INTRAMUSCULAR | Status: AC
  Filled 2022-12-23: qty 1

## 2022-12-23 MED ORDER — EPOETIN ALFA-EPBX 10000 UNIT/ML IJ SOLN
10000.0000 [IU] | INTRAMUSCULAR | Status: DC
  Administered 2022-12-23: 10000 [IU] via SUBCUTANEOUS

## 2022-12-28 ENCOUNTER — Other Ambulatory Visit: Payer: Self-pay | Admitting: Internal Medicine

## 2022-12-28 DIAGNOSIS — H16003 Unspecified corneal ulcer, bilateral: Secondary | ICD-10-CM

## 2022-12-28 DIAGNOSIS — M0579 Rheumatoid arthritis with rheumatoid factor of multiple sites without organ or systems involvement: Secondary | ICD-10-CM

## 2023-01-06 ENCOUNTER — Encounter (HOSPITAL_COMMUNITY)
Admission: RE | Admit: 2023-01-06 | Discharge: 2023-01-06 | Disposition: A | Discharge status: Home / Self Care | Payer: Medicare PPO | Source: Ambulatory Visit | Attending: Nephrology | Admitting: Nephrology

## 2023-01-06 VITALS — BP 94/68 | HR 81 | Temp 97.3°F | Resp 18

## 2023-01-06 DIAGNOSIS — N183 Chronic kidney disease, stage 3 unspecified: Secondary | ICD-10-CM | POA: Diagnosis not present

## 2023-01-06 LAB — POCT HEMOGLOBIN-HEMACUE: Hemoglobin: 10.9 g/dL — ABNORMAL LOW (ref 12.0–15.0)

## 2023-01-06 MED ORDER — EPOETIN ALFA-EPBX 10000 UNIT/ML IJ SOLN
10000.0000 [IU] | INTRAMUSCULAR | Status: DC
  Administered 2023-01-06: 10000 [IU] via SUBCUTANEOUS

## 2023-01-06 MED ORDER — EPOETIN ALFA-EPBX 10000 UNIT/ML IJ SOLN
INTRAMUSCULAR | Status: AC
  Filled 2023-01-06: qty 1

## 2023-01-12 ENCOUNTER — Other Ambulatory Visit: Payer: Self-pay | Admitting: Internal Medicine

## 2023-01-12 DIAGNOSIS — H16003 Unspecified corneal ulcer, bilateral: Secondary | ICD-10-CM

## 2023-01-12 DIAGNOSIS — M0579 Rheumatoid arthritis with rheumatoid factor of multiple sites without organ or systems involvement: Secondary | ICD-10-CM

## 2023-01-13 ENCOUNTER — Ambulatory Visit (INDEPENDENT_AMBULATORY_CARE_PROVIDER_SITE_OTHER): Payer: Medicare PPO | Admitting: Nurse Practitioner

## 2023-01-13 ENCOUNTER — Encounter: Payer: Self-pay | Admitting: Nurse Practitioner

## 2023-01-13 VITALS — BP 80/60 | HR 74 | Temp 98.4°F | Ht 63.0 in | Wt 100.0 lb

## 2023-01-13 DIAGNOSIS — Z2821 Immunization not carried out because of patient refusal: Secondary | ICD-10-CM

## 2023-01-13 DIAGNOSIS — S31000A Unspecified open wound of lower back and pelvis without penetration into retroperitoneum, initial encounter: Secondary | ICD-10-CM | POA: Diagnosis not present

## 2023-01-13 DIAGNOSIS — I5020 Unspecified systolic (congestive) heart failure: Secondary | ICD-10-CM | POA: Diagnosis not present

## 2023-01-13 DIAGNOSIS — R42 Dizziness and giddiness: Secondary | ICD-10-CM | POA: Diagnosis not present

## 2023-01-13 DIAGNOSIS — R7303 Prediabetes: Secondary | ICD-10-CM | POA: Insufficient documentation

## 2023-01-13 NOTE — Patient Instructions (Addendum)
Vertigo Vertigo is the feeling that you or the things around you are moving when they are not. This feeling can come and go at any time. Vertigo often goes away on its own. This condition can be dangerous if it happens when you are doing activities like driving or working with machines. Your doctor will do tests to find the cause of your vertigo. These tests will also help your doctor decide on the best treatment for you. Follow these instructions at home: Eating and drinking     Drink enough fluid to keep your pee (urine) pale yellow. Do not drink alcohol. Activity Return to your normal activities when your doctor says that it is safe. In the morning, first sit up on the side of the bed. When you feel okay, stand slowly while you hold onto something until you know that your balance is fine. Move slowly. Avoid sudden body or head movements or certain positions, as told by your doctor. Use a cane if you have trouble standing or walking. Sit down right away if you feel dizzy. Avoid doing any tasks or activities that can cause danger to you or others if you get dizzy. Avoid bending down if you feel dizzy. Place items in your home so that they are easy for you to reach without bending or leaning over. Do not drive or use machinery if you feel dizzy. General instructions Take over-the-counter and prescription medicines only as told by your doctor. Keep all follow-up visits. Contact a doctor if: Your medicine does not help your vertigo. Your problems get worse or you have new symptoms. You have a fever. You feel like you may vomit (nauseous), or this feeling gets worse. You start to vomit. Your family or friends see changes in how you act. You lose feeling (have numbness) in part of your body. You feel prickling and tingling in a part of your body. Get help right away if: You are always dizzy. You faint. You get very bad headaches. You get a stiff neck. Bright light starts to bother  you. You have trouble moving or talking. You feel weak in your hands, arms, or legs. You have changes in your hearing or in how you see (vision). These symptoms may be an emergency. Get help right away. Call your local emergency services (911 in the [place] Do not wait to see if the symptoms will go away. Do not drive yourself to the hospital. Summary Vertigo is the feeling that you or the things around you are moving when they are not. Your doctor will do tests to find the cause of your vertigo. You may be told to avoid some tasks, positions, or movements. Contact a doctor if your medicine is not helping, or if you have a fever, new symptoms, or a change in how you act. Get help right away if you get very bad headaches, or if you have changes in how you speak, hear, or see. This information is not intended to replace advice given to you by your health care provider. Make sure you discuss any questions you have with your health care provider. Document Revised: 06/26/2020 Document Reviewed: 06/26/2020 Elsevier Patient Education  2024 [employer]   You can get a duoderm from the supply store to protect your buttocks area.

## 2023-01-13 NOTE — Progress Notes (Signed)
Hershal Coria Martin,acting as a Neurosurgeon for [name], [name].,have documented all relevant documentation on the behalf of [name], [name],as directed by  [name], [name] while in the presence of [name], [name].   Subjective:     [name] , female    DOB: 01-18-1946 , 77 y.o.   MRN: [id number]   Chief Complaint  Patient presents with   Dizziness    HPI  Patient presents today for having some increases vertigo, patient reports she is still having chest pains. Patient reports the chest happen on and off, about 2-3 times a week. Patient did have EKG at last hospital visit.    She is going every other week to [name] for injections for her hemoglobin.   BP Readings from Last 3 Encounters: 01/13/23 : (!) 80/60 01/06/23 : 94/68 12/23/22 : (!) 84/65  Wt Readings from Last 3 Encounters: 01/13/23 : 100 lb (45.4 kg) 12/08/22 : 100 lb (45.4 kg) 11/25/22 : 100 lb (45.4 kg)  Symptoms have been ongoing for several weeks. When she lays her head down on the pillow she has where the room is spinning. She did try dramamine but was not effective, caused her nausea.   She is using a walker at home. She has an aide with her 5 days a week.   She has a poor appetite.  She also has an area on her buttocks she wants looked at.      Past Medical History:  Diagnosis Date   Acid reflux 07/15/2020   Arthritis 07/15/2020   CHF (congestive heart failure) (HCC)    Colitis 07/15/2020   Dilated cardiomyopathy (HCC) 07/15/2020   HFrEF (heart failure with reduced ejection fraction) (HCC) 07/15/2020   HTN (hypertension) 07/15/2020   Osteoporosis 07/15/2020   Palpitations 07/15/2020   Rheumatoid arthritis (HCC) 07/15/2020     Family History  Problem Relation Age of Onset   Hypertension Mother    Diabetes Mother    Hypertension Father    Diabetes Father    Breast cancer Maternal Aunt    Breast cancer Paternal Aunt    Arthritis Maternal Grandmother    Lung disease Paternal  Grandfather    Cancer Brother      Current Outpatient Medications:    amiodarone (PACERONE) 200 MG tablet, TAKE 1 TABLET BY MOUTH EVERY DAY, Disp: 60 tablet, Rfl: 6   bumetanide (BUMEX) 1 MG tablet, Take 1 tablet (1 mg total) by mouth daily., Disp: , Rfl:    calcitRIOL (ROCALTROL) 0.25 MCG capsule, Take 0.25 mcg by mouth daily., Disp: , Rfl:    ELIQUIS 2.5 MG TABS tablet, TAKE 1 TABLET BY MOUTH TWICE A DAY (Patient taking differently: Take 2.5 mg by mouth 2 (two) times daily.), Disp: 60 tablet, Rfl: 5   Ensure (ENSURE), Take 237 mLs by mouth daily as needed (for meal supplement)., Disp: , Rfl:    folic acid (FOLVITE) 1 MG tablet, TAKE 1 TABLET BY MOUTH EVERY DAY, Disp: 30 tablet, Rfl: 11   hydroxypropyl methylcellulose / hypromellose (ISOPTO TEARS / GONIOVISC) 2.5 % ophthalmic solution, Place 1 drop into both eyes as needed for dry eyes., Disp: , Rfl:    methocarbamol (ROBAXIN) 500 MG tablet, Take 1 tablet (500 mg total) by mouth 2 (two) times daily as needed for muscle spasms., Disp: 20 tablet, Rfl: 0   metoCLOPramide (REGLAN) 5 MG tablet, Take 1 tablet (5 mg total) by mouth every 8 (eight) hours as needed for nausea or vomiting.,  Disp: 30 tablet, Rfl: 0   Multiple Vitamin (MULTIVITAMIN) tablet, Take 1 tablet by mouth daily., Disp: , Rfl:    omeprazole (PRILOSEC) 40 MG capsule, Take 1 capsule (40 mg total) by mouth 2 (two) times daily., Disp: 60 capsule, Rfl: 1   polyethylene glycol (MIRALAX / GLYCOLAX) 17 g packet, Take 17 g by mouth daily as needed for mild constipation., Disp: , Rfl:    predniSONE (DELTASONE) 5 MG tablet, Take 1 tablet (5 mg total) by mouth daily with breakfast., Disp: 90 tablet, Rfl: 1   Vericiguat (VERQUVO) 10 MG TABS, Take 1 tablet (10 mg total) by mouth daily., Disp: 90 tablet, Rfl: 3   traMADol (ULTRAM) 50 MG tablet, Take 1 tablet (50 mg total) by mouth every 6 (six) hours as needed. (Patient not taking: Reported on 12/08/2022), Disp: 15 tablet, Rfl: 0   Allergies   Allergen Reactions   Baclofen Nausea And Vomiting   Penicillin G Other (See Comments)    Unknown   Penicillins Rash     Review of Systems  Constitutional: Negative.   HENT: Negative.    Eyes: Negative.   Respiratory: Negative.    Cardiovascular: Negative.   Gastrointestinal: Negative.      Today's Vitals   01/13/23 0900  BP: (!) 80/60  Pulse: 74  Temp: 98.4 F (36.9 C)  TempSrc: Oral  Weight: 100 lb (45.4 kg)  Height: 5\' 3"  (1.6 m)  PainSc: 7   PainLoc: Neck   Body mass index is 17.71 kg/m.  Wt Readings from Last 3 Encounters:  01/13/23 100 lb (45.4 kg)  12/08/22 100 lb (45.4 kg)  11/25/22 100 lb (45.4 kg)   No data found.    Objective:  Physical Exam Vitals reviewed.  Constitutional:      General: She is not in acute distress.    Appearance: Normal appearance.  Cardiovascular:     Pulses: Normal pulses.     Heart sounds: Normal heart sounds. No murmur heard. Pulmonary:     Effort: Pulmonary effort is normal. No respiratory distress.     Breath sounds: Normal breath sounds. No wheezing.  Skin:    General: Skin is warm and dry.     Comments: Sacrum with healing wound dried skin.   Neurological:     General: No focal deficit present.     Mental Status: She is alert and oriented to person, place, and time.     Cranial Nerves: No cranial nerve deficit.     Motor: No weakness.  Psychiatric:        Mood and Affect: Mood normal.        Behavior: Behavior normal.        Thought Content: Thought content normal.        Judgment: Judgment normal.         Assessment And Plan:     1. Vertigo Comments: May be related to her low blood pressure, unfortunately she has CKD 4 and meclizine is contraindicated.  2. Prediabetes Comments: HgbA1c has been stable. Continue focusing on healthy diet - Hemoglobin A1c  3. HFrEF (heart failure with reduced ejection fraction) (HCC) Comments: Continue f/u with Cardiology, she seems to be doing better and does not have  as much of the swelling  4. Tetanus, diphtheria, and acellular pertussis (Tdap) vaccination declined  5. Herpes zoster vaccination declined  6. COVID-19 vaccination declined  7. Immunization declined Comments: Pneumonia vaccine declined.  8. Wound of sacral region, initial encounter Comments: She has healing  wound to sacrum, dried skin. She can get a duoderm to keep on for several days to help protect skin.    Return for controlled DM check 4 months.  Patient was given opportunity to ask questions. Patient verbalized understanding of the plan and was able to repeat key elements of the plan. All questions were answered to their satisfaction.  [name], [name]   I, [name], [name], have reviewed all documentation for this visit. The documentation on 01/13/23 for the exam, diagnosis, procedures, and orders are all accurate and complete.   IF YOU HAVE BEEN REFERRED TO A SPECIALIST, IT MAY TAKE 1-2 WEEKS TO SCHEDULE/PROCESS THE REFERRAL. IF YOU HAVE NOT HEARD FROM US/SPECIALIST IN TWO WEEKS, PLEASE GIVE [place] A CALL AT [phone].   THE PATIENT IS ENCOURAGED TO PRACTICE SOCIAL DISTANCING DUE TO THE COVID-19 PANDEMIC.

## 2023-01-18 NOTE — Progress Notes (Signed)
[facility] Progress Note   PCP: [name], [name] Nephrology: [name] HF Cardiologist: [name]  77 y.o. with history of rheumatoid arthritis, CKD stage 3, and nonischemic cardiomyopathy was referred by [name] in [place] to establish heart failure care in [place].  Patient has been known to have a cardiomyopathy since 2015.  Cardiac MRI in 2015 showed EF 37% with no LGE.  Coronary angiography at that time showed no significant coronary disease.  Over the next few years, LV EF fluctuated up and down.  In 3/21, echo showed EF down to 20-25%.  She was admitted to the hospital in [place], [place] in 11/21 with CHF exacerbation.  Echo showed EF 20-25%, global hypokinesis, mildly decreased RV systolic function.  Creatinine was noted to be elevated. She was diuresed and sent home, but was readmitted later in 11/21 with ongoing volume overload. Creatinine was up to 2.89.  She was taken off most of her cardiac meds due to soft BP and elevated creatinine. She was discharged to her daughter's house in [place].   Repeated an echo on her in 12/21, EF < 20% with moderate LV dilation and mildly decreased RV systolic function. RHC was done, surprisingly showing low filling pressures and relatively preserved cardiac index at 2.22.   She was admitted in 3/22 with atrial fibrillation, converted to NSR on amiodarone. She went into atrial flutter after this and had TEE-DCCV in 4/22.  TEE showed EF < 20%, moderate LV dilation, mild RV dilation with moderate RV dysfunction, moderate central MR.  CPX in 3/22 showed severe HF limitation.  PYP scan 4/22 was grade 1 with H/CL 1.48, equivocal.  PYP scan repeated in 3/23 and likely negative.   She was evaluated for [facility] or ANTHEM, BNP too high and thought to be too frail.   Cardiac MRI in 10/22 showed mild LV dilation with EF 23%, normal RV size with RVEF 37%, ECV 34%, small area of subendocardial LGE in the mid inferolateral wall  and small area of full thickness LGE in the basal  inferior wall.   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    Follow up 6/23, BP low and midodrine started. Discussed [place]. Felt to be near end-stage HF.  Admitted 07/08/22 after mechanical fall resulting in left hip fracture. S/P left intramedullary nail 07/10/22.  Post op developed AKI. Given IV fluids . Diuretics adjusted. Echo showed EF improved to 45%. Discharged to [facility].   Admitted in 1/24 with acute on chronic systolic CHF and AKI. She required milrinone, RHC showed CI 2.81 on milrinone 0.25.  Echo showed EF < 20%.  Suspected end stage HF.   Echo 3/24 showed EF 20-25%, mild LV dilation, severely decreased RV systolic function, mild RV enlargement, severe biatrial enlargement, moderate-severe TR, moderate MR, IVC normal.   She was admitted 3/13-3/24 w/ symptomatic anemia and dyspepsia. Hgb 6.8,  transfused 2 uRBCs. GI recommended conservative management. There was no overt blood loss-FOBT was negative-gastroenterology did not feel endoscopic evaluation was warranted. She was started on Protonix w/ improvement of symptoms and relieve of  dyspepsia. Hgb remained stable post transfusion. Hospital course was c/b by volume overload. AHF team consulted and she was diuresed w/ IV Lasix, then transitioned back to Bumex.   She returns today for post hospital f/u. Wt has been stable since discharge. ReDs 29%. No resting dyspnea. Notes chronic exertional fatigue. No LEE, orthopnea/PND. Denies any further dyspepsia. She  had f/u w/ nephrology yesterday and labs were done (copy of results requested). She has been set up for scheduled Fe infusions. Compliant w/ meds. BP ok 100/66. No orthostatic symptoms.   ECG: NSR, PVC, poor RWP.  Labs (12/21): K 5 => 3.7, creatinine 2.89 => 1.9 => 1.69, BNP 2904 Labs (1/22): K 4, creatinine 1.9, hgb 12.5 Labs (3/22): K 3.6, creatinine 2.36, hgb 11.6, TSH normal Labs  (4/22): K 3.7, creatinine 2.25, LFTs normal, urine immunofixation normal, pro-BNP 6498 Labs (7/22): Myeloma panel with IgG monoclonal protein Labs (10/22): K 4.1, creatinine 1.96 Labs (12/22): BNP 549, TSH normal, K 4.3, creatinine 1.78 Labs (1/23): SCr 2.61, K 4.0, Hgb 11.4  Lab (3/23): Scr 2.29, K 3.9  Labs (6/23): K 4.1, creatinine 3.82, normal LFTs, normal TSH, hgb 9.4 Labs (07/15/22): K 4.4 Creatinine 2.78  Labs (2/24): K 4.1, creatinine 2.76, hgb 8.3  PMH: 1. GERD 2. Rheumatoid arthritis: Followed by rheumatology in [place].  - No known pulmonary disease from RA.  3. Osteoporosis. 4. Gout 5. CKD stage 4 6. H/o SVT 7. Chronic systolic CHF: She was found to have nonischemic cardiomyopathy with low EF in 2015.  - LHC (2015): Normal coronaries.  - cardiac MRI (2015): EF 37%, no LGE - Echo (3/21): EF 20-25%. - Echo (11/21): EF 20-25%, moderate LV dilation, global hypokinesis, biatrial enlargement, mildly decreased RV systolic function.  - Echo (12/21): EF < 20%, moderate LV dilation, normal RV size/systolic function, moderate MR, IVC normal.  - RHC (1/22): Mean RA 1, PA 39/14, mean PCWP 10, PVR 3.9 WU, CI 2.22.  - CPX (3/22): peak VO2 10.1, VE/VCO2 55, RER 1.13.  Severe functional limitation due to HF.  - TEE (4/22): EF < 20%, moderate LV dilation, mild RV dilation with moderate RV dysfunction, moderate central MR. - PYP scan (4/22): grade 1 with H/CL 1.48. Equivocal. [employer] gene testing negative. - Cardiac MRI (10/22): mild LV dilation with EF 23%, normal RV size with RVEF 37%, ECV 34%, small area of subendocardial LGE in the mid inferolateral wall and small area of full thickness LGE in the basal inferior wall.  - PYP scan (3/23): grade 1, H/CL 1.3.  Probably negative.  - Echo 07/11/22: EF 45-50% RV normal  - Echo (3/24): EF 20-25%, mild LV dilation, severely decreased RV systolic function, mild RV enlargement, severe biatrial enlargement, moderate-severe TR, moderate MR, IVC  normal.  8. Atrial fibrillation/flutter: TEE-DCCV in 4/22.  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  10. Varicella zoster with post-herpetic neuralgia 11. L hip fracture , mechanical fall.  12. Anemia  SH: From [place] but recently moved to [place] to live with daughter.  Has 3 children.  Nonsmoker.  No ETOH.   FH: Mother with CHF, father with MI.   ROS: All systems reviewed and negative except as per HPI.   Current Outpatient Medications  Medication Sig Dispense Refill   amiodarone (PACERONE) 200 MG tablet TAKE 1 TABLET BY MOUTH EVERY DAY 60 tablet 6   bumetanide (BUMEX) 1 MG tablet Take 1 tablet (1 mg total) by mouth daily.     calcitRIOL (ROCALTROL) 0.25 MCG capsule Take 0.25 mcg by mouth daily.     ELIQUIS 2.5 MG TABS tablet TAKE 1 TABLET BY MOUTH TWICE A DAY (Patient taking differently: Take 2.5 mg by mouth 2 (two) times daily.) 60 tablet 5   Ensure (ENSURE) Take 237 mLs by mouth daily as needed (for meal supplement).     folic acid (  FOLVITE) 1 MG tablet TAKE 1 TABLET BY MOUTH EVERY DAY 30 tablet 11   hydroxypropyl methylcellulose / hypromellose (ISOPTO TEARS / GONIOVISC) 2.5 % ophthalmic solution Place 1 drop into both eyes as needed for dry eyes.     methocarbamol (ROBAXIN) 500 MG tablet Take 1 tablet (500 mg total) by mouth 2 (two) times daily as needed for muscle spasms. 20 tablet 0   metoCLOPramide (REGLAN) 5 MG tablet Take 1 tablet (5 mg total) by mouth every 8 (eight) hours as needed for nausea or vomiting. 30 tablet 0   Multiple Vitamin (MULTIVITAMIN) tablet Take 1 tablet by mouth daily.     omeprazole (PRILOSEC) 40 MG capsule Take 1 capsule (40 mg total) by mouth 2 (two) times daily. 60 capsule 1   polyethylene glycol (MIRALAX / GLYCOLAX) 17 g packet Take 17 g by mouth daily as needed for mild constipation.     predniSONE (DELTASONE) 5 MG tablet Take 1 tablet (5 mg total) by mouth daily with breakfast. 90 tablet 1   traMADol (ULTRAM) 50 MG tablet  Take 1 tablet (50 mg total) by mouth every 6 (six) hours as needed. (Patient not taking: Reported on 12/08/2022) 15 tablet 0   Vericiguat (VERQUVO) 10 MG TABS Take 1 tablet (10 mg total) by mouth daily. 90 tablet 3   No current facility-administered medications for this visit.   Wt Readings from Last 3 Encounters:  01/13/23 45.4 kg (100 lb)  12/08/22 45.4 kg (100 lb)  11/25/22 45.4 kg (100 lb)   There were no vitals taken for this visit. ReDs 29%  PHYSICAL EXAM: General:  thin/ frail and chronically ill appearing. No respiratory difficulty HEENT: normal Neck: supple. no JVD. Carotids 2+ bilat; no bruits. No lymphadenopathy or thyromegaly appreciated. Cor: PMI nondisplaced. Regular rate & rhythm. No rubs, gallops or murmurs. Lungs: clear Abdomen: soft, nontender, nondistended. No hepatosplenomegaly. No bruits or masses. Good bowel sounds. Extremities: no cyanosis, clubbing, rash, edema Neuro: alert & oriented x 3, cranial nerves grossly intact. moves all 4 extremities w/o difficulty. Affect pleasant.    Assessment/Plan: 1. Chronic Systolic CHF: Nonischemic cardiomyopathy.  This has been known since 2015, cath in 2015 showed no coronary disease and cardiac MRI in 2015 showed no LGE.  Cause is uncertain, familial cardiomyopathy is a concern given nonischemic cardiomyopathy in her mother.  Cannot rule out remote viral myocarditis.Poor appetite and severe HF limitation on CPX in 3/22 is concerning, as is elevated creatinine (suspect cardiorenal syndrome).  Low BP and elevated creatinine have limited her cardiac med regimen. RHC (1/22) showed low filling pressures and actually a relatively preserved cardiac index of 2.22.  PYP scan in 4/22 was equivocal, PYP scan repeated in 3/23 and likely negative.  Cardiac MRI in 10/22 showed mild LV dilation with EF 23%, normal RV size with RVEF 37%, ECV 34%, small area of subendocardial LGE in the mid inferolateral wall and small area of full thickness LGE in  the basal inferior wall.   This was not suggestive of cardiac amyloidosis but possibly could be consistent with coronary embolism (though no LV thrombus visualized) versus prior myocarditis versus sarcoidosis. With renal failure, CHF, and atrial arrhythmias, we have assessed for cardiac amyloidosis.  PYP scan was equivocal in 4/22 for transthyretin cardiac amyloidosis.  It was repeated in 3/23 and likely negative.  [employer] gene testing for TTR mutations was negative. The cardiac MRI was not consistent with amyloidosis.  She has MGUS with monoclonal IgG paraprotein, but based on cMRI  and slow progression, think AL amyloidosis is unlikely.  Most recent echo 3/24, EF 20-25%, mild LV dilation, severely decreased RV systolic function, mild RV enlargement, severe biatrial enlargement, moderate-severe TR, moderate MR, IVC normal. She was admitted in 1/24 and required milrinone.  Chronically NYHA class IIIb symptoms and CKD stage IV.  Suspect she is nearing end-stage.  Not a candidate for advanced therapies.  She is euvolemic on exam today. ReDs 29%.  GDMT limited by CKD stage IV and low BP.  - Continue Bumex 1 mg daily.  - She no longer requires midodrine.  - Continue Verquvo 10 mg daily.  - Off dapagliflozin with rise in creatinine, with low BP would not restart.   - Off spironolactone with elevated creatinine and soft BP.  - Does not have an ICD, would not place with likely end stage.   2. CKD: Stage 4.  She is followed by [name]. Had f/u yesterday and labs were done (have requested copy of results) 3. Rheumatoid arthritis: No history of lung involvement. She has been on a low dose of prednisone chronically. No off Imuran, felt to be contributing to anemia  - needs f/u w/ Dr. Dimple Casey  4. Atrial fibrillation/flutter: S/p TEE-DCCV in 4/22.  She is now on amiodarone and in NSR.  - Continue amiodarone 200 mg daily.  Recent LFTs and TSH WNL (3/24).  Will need regular eye exam.  - Continue Eliquis 5 mg bid. No  gross bleeding but recent anemia, likely 2/2 anemia of chronic disease from CKD. Labs followed by nephrology. Getting scheduled Fe infusions.  5. Anemia: per above. Labs were checked at nephrology clinic yesterday. Copy of results requested - nephrology to manage iron infusions and follow labs    F/u w/ APP in 2 months   [name], [place]   01/18/2023

## 2023-01-19 ENCOUNTER — Encounter (HOSPITAL_COMMUNITY): Payer: Self-pay

## 2023-01-19 ENCOUNTER — Ambulatory Visit (HOSPITAL_COMMUNITY)
Admission: RE | Admit: 2023-01-19 | Discharge: 2023-01-19 | Disposition: A | Discharge status: Home / Self Care | Payer: Medicare PPO | Source: Ambulatory Visit | Attending: Family Medicine | Admitting: Family Medicine

## 2023-01-19 VITALS — BP 82/64 | HR 85 | Ht 63.0 in | Wt 98.0 lb

## 2023-01-19 DIAGNOSIS — Z7189 Other specified counseling: Secondary | ICD-10-CM

## 2023-01-19 DIAGNOSIS — Z8249 Family history of ischemic heart disease and other diseases of the circulatory system: Secondary | ICD-10-CM | POA: Diagnosis not present

## 2023-01-19 DIAGNOSIS — E861 Hypovolemia: Secondary | ICD-10-CM | POA: Diagnosis not present

## 2023-01-19 DIAGNOSIS — Z7901 Long term (current) use of anticoagulants: Secondary | ICD-10-CM | POA: Insufficient documentation

## 2023-01-19 DIAGNOSIS — Z79899 Other long term (current) drug therapy: Secondary | ICD-10-CM | POA: Insufficient documentation

## 2023-01-19 DIAGNOSIS — D631 Anemia in chronic kidney disease: Secondary | ICD-10-CM | POA: Diagnosis not present

## 2023-01-19 DIAGNOSIS — M069 Rheumatoid arthritis, unspecified: Secondary | ICD-10-CM | POA: Diagnosis not present

## 2023-01-19 DIAGNOSIS — R7303 Prediabetes: Secondary | ICD-10-CM

## 2023-01-19 DIAGNOSIS — I5022 Chronic systolic (congestive) heart failure: Secondary | ICD-10-CM | POA: Diagnosis not present

## 2023-01-19 DIAGNOSIS — I480 Paroxysmal atrial fibrillation: Secondary | ICD-10-CM

## 2023-01-19 DIAGNOSIS — I5020 Unspecified systolic (congestive) heart failure: Secondary | ICD-10-CM | POA: Diagnosis not present

## 2023-01-19 DIAGNOSIS — I428 Other cardiomyopathies: Secondary | ICD-10-CM | POA: Insufficient documentation

## 2023-01-19 DIAGNOSIS — N184 Chronic kidney disease, stage 4 (severe): Secondary | ICD-10-CM | POA: Diagnosis not present

## 2023-01-19 DIAGNOSIS — D472 Monoclonal gammopathy: Secondary | ICD-10-CM | POA: Insufficient documentation

## 2023-01-19 DIAGNOSIS — D649 Anemia, unspecified: Secondary | ICD-10-CM

## 2023-01-19 DIAGNOSIS — R42 Dizziness and giddiness: Secondary | ICD-10-CM | POA: Diagnosis present

## 2023-01-19 DIAGNOSIS — Z7952 Long term (current) use of systemic steroids: Secondary | ICD-10-CM | POA: Diagnosis not present

## 2023-01-19 LAB — BASIC METABOLIC PANEL
Anion gap: 12 (ref 5–15)
BUN: 79 mg/dL — ABNORMAL HIGH (ref 8–23)
CO2: 19 mmol/L — ABNORMAL LOW (ref 22–32)
Calcium: 8.9 mg/dL (ref 8.9–10.3)
Chloride: 105 mmol/L (ref 98–111)
Creatinine, Ser: 3.44 mg/dL — ABNORMAL HIGH (ref 0.44–1.00)
GFR, Estimated: 13 mL/min — ABNORMAL LOW (ref >60)
Glucose, Bld: 104 mg/dL — ABNORMAL HIGH (ref 70–99)
Potassium: 4.4 mmol/L (ref 3.5–5.1)
Sodium: 136 mmol/L (ref 135–145)

## 2023-01-19 LAB — HEMOGLOBIN A1C
Hgb A1c MFr Bld: 5 % (ref 4.8–5.6)
Mean Plasma Glucose: 96.8 mg/dL

## 2023-01-19 MED ORDER — AMIODARONE HCL 200 MG PO TABS: 100.0000 mg | ORAL_TABLET | Freq: Every day | ORAL | 6 refills | Status: DC

## 2023-01-19 NOTE — Progress Notes (Signed)
REDS VEST READING= 23 CHEST RULER=24.5 HEIGHT MARKER=A

## 2023-01-19 NOTE — Progress Notes (Signed)
Palliative Care referral faxed

## 2023-01-19 NOTE — Patient Instructions (Signed)
Medication Changes:  HOLD BUMEX for 3 DAYS, then restart   STOP Verquvo  Decrease Amiodarone to 100 mg (1/2 tab) Daily  Lab Work:  Labs done today, we will call you for abnormal results  Testing/Procedures:  none  Referrals:  You have been referred to Palliative Care, they will call you to schedule a home visit  Special Instructions // Education:  Please Liberalize salt and fluid intake  Follow-Up in:   3 weeks with APP: Tuesday 7/2 at 11:30 am 2 months with Dr Shirlee Latch: Clovis Cao 8/15 at 10:40 am   At the [facility], you and your health needs are our priority. We have a designated team specialized in the treatment of Heart Failure. This Care Team includes your primary Heart Failure Specialized Cardiologist (physician), Advanced Practice Providers (APPs- Physician Assistants and Nurse Practitioners), and Pharmacist who all work together to provide you with the care you need, when you need it.   You may see any of the following providers on your designated Care Team at your next follow up:  Dr. Arvilla Meres Dr. Marca Ancona Dr. Marcos Eke, NP Robbie Lis, [place] [facility] [place], [place] Brynda Peon, NP Karle Plumber, PharmD   Please be sure to bring in all your medications bottles to every appointment.   Need to Contact us:  If you have any questions or concerns before your next appointment please send [place] a message through [place] or call our office at [phone].    TO LEAVE A MESSAGE FOR THE NURSE SELECT OPTION 2, PLEASE LEAVE A MESSAGE INCLUDING: YOUR NAME DATE OF BIRTH CALL BACK NUMBER REASON FOR CALL**this is important as we prioritize the call backs  YOU WILL RECEIVE A CALL BACK THE SAME DAY AS LONG AS YOU CALL BEFORE 4:00 PM

## 2023-01-19 NOTE — Addendum Note (Signed)
Encounter addended by: Noralee Space, RN on: 01/19/2023 5:09 PM  Actions taken: Clinical Note Signed

## 2023-01-20 ENCOUNTER — Ambulatory Visit (HOSPITAL_COMMUNITY)
Admission: RE | Admit: 2023-01-20 | Discharge: 2023-01-20 | Disposition: A | Discharge status: Home / Self Care | Payer: Medicare PPO | Source: Ambulatory Visit | Attending: Nephrology | Admitting: Nephrology

## 2023-01-20 ENCOUNTER — Encounter: Payer: Self-pay | Admitting: Nurse Practitioner

## 2023-01-20 VITALS — BP 103/70 | HR 79 | Temp 97.3°F | Resp 18

## 2023-01-20 DIAGNOSIS — N183 Chronic kidney disease, stage 3 unspecified: Secondary | ICD-10-CM | POA: Insufficient documentation

## 2023-01-20 LAB — IRON AND TIBC
Iron: 77 ug/dL (ref 28–170)
Saturation Ratios: 29 % (ref 10.4–31.8)
TIBC: 269 ug/dL (ref 250–450)
UIBC: 192 ug/dL

## 2023-01-20 LAB — POCT HEMOGLOBIN-HEMACUE: Hemoglobin: 10 g/dL — ABNORMAL LOW (ref 12.0–15.0)

## 2023-01-20 LAB — FERRITIN: Ferritin: 566 ng/mL — ABNORMAL HIGH (ref 11–307)

## 2023-01-20 MED ORDER — EPOETIN ALFA-EPBX 10000 UNIT/ML IJ SOLN: 10000.0000 [IU] | INTRAMUSCULAR | Status: DC

## 2023-01-20 MED ORDER — EPOETIN ALFA-EPBX 10000 UNIT/ML IJ SOLN
INTRAMUSCULAR | Status: AC
  Administered 2023-01-20: 10000 [IU] via SUBCUTANEOUS
  Filled 2023-01-20: qty 1

## 2023-02-01 ENCOUNTER — Telehealth: Payer: Self-pay

## 2023-02-01 NOTE — Telephone Encounter (Signed)
PC SW connected with patient to follow up post hospital stay and schedule in home follow up visit. Patient requested SW to call back in a few days to discuss as she was not in a state to talk at this time.  SW to outreach again at later date/time.

## 2023-02-03 ENCOUNTER — Encounter: Payer: Self-pay | Admitting: Family Medicine

## 2023-02-03 ENCOUNTER — Ambulatory Visit: Payer: Medicare PPO | Admitting: Family Medicine

## 2023-02-03 ENCOUNTER — Encounter (HOSPITAL_COMMUNITY)
Admission: RE | Admit: 2023-02-03 | Discharge: 2023-02-03 | Disposition: A | Discharge status: Home / Self Care | Payer: Medicare PPO | Source: Ambulatory Visit | Attending: Nephrology | Admitting: Nephrology

## 2023-02-03 VITALS — BP 104/75 | Temp 97.4°F | Resp 17

## 2023-02-03 VITALS — BP 122/74 | HR 73 | Temp 98.1°F | Ht 63.0 in | Wt 98.4 lb

## 2023-02-03 DIAGNOSIS — R21 Rash and other nonspecific skin eruption: Secondary | ICD-10-CM

## 2023-02-03 DIAGNOSIS — L989 Disorder of the skin and subcutaneous tissue, unspecified: Secondary | ICD-10-CM | POA: Insufficient documentation

## 2023-02-03 DIAGNOSIS — Z2821 Immunization not carried out because of patient refusal: Secondary | ICD-10-CM | POA: Diagnosis not present

## 2023-02-03 DIAGNOSIS — N183 Chronic kidney disease, stage 3 unspecified: Secondary | ICD-10-CM | POA: Insufficient documentation

## 2023-02-03 DIAGNOSIS — D6869 Other thrombophilia: Secondary | ICD-10-CM

## 2023-02-03 LAB — POCT HEMOGLOBIN-HEMACUE: Hemoglobin: 9.6 g/dL — ABNORMAL LOW (ref 12.0–15.0)

## 2023-02-03 MED ORDER — EPOETIN ALFA-EPBX 10000 UNIT/ML IJ SOLN
10000.0000 [IU] | INTRAMUSCULAR | Status: DC
  Administered 2023-02-03: 10000 [IU] via SUBCUTANEOUS

## 2023-02-03 MED ORDER — EPOETIN ALFA-EPBX 10000 UNIT/ML IJ SOLN
INTRAMUSCULAR | Status: AC
  Filled 2023-02-03: qty 1

## 2023-02-03 NOTE — Progress Notes (Addendum)
I,Yamilka Littleton CMA, acting as a Neurosurgeon for [employer], NP.,have documented all relevant documentation on the behalf of Vinie Charity, NP,as directed by  Johnasia Liese Moshe Salisbury, NP while in the presence of Merridy Pascoe, NP.  Subjective:  [name] , female    DOB: 01/04/1946 , 77 y.o.   MRN: [id number]  Chief Complaint  Patient presents with   Referral   bumps    HPI  Patient presents today for a referral to dermatology. Patient reports she has small little bumps all over here body. She reports they do not itch or bother her they just keep popping up. She noticed the bumps about a month ago. She reports the bumps dry out and appear somewhere else in her body. Denies any new related aches and fever.  Rash This is a new problem. The current episode started more than 1 month ago. The problem has been waxing and waning since onset. The rash is diffuse. The rash is characterized by dryness. She was exposed to nothing. Associated symptoms include coughing. Past treatments include nothing.     Past Medical History:  Diagnosis Date   Acid reflux 07/15/2020   Arthritis 07/15/2020   CHF (congestive heart failure) (HCC)    Colitis 07/15/2020   Dilated cardiomyopathy (HCC) 07/15/2020   HFrEF (heart failure with reduced ejection fraction) (HCC) 07/15/2020   HTN (hypertension) 07/15/2020   Osteoporosis 07/15/2020   Palpitations 07/15/2020   Rheumatoid arthritis (HCC) 07/15/2020     Family History  Problem Relation Age of Onset   Hypertension Mother    Diabetes Mother    Hypertension Father    Diabetes Father    Breast cancer Maternal Aunt    Breast cancer Paternal Aunt    Arthritis Maternal Grandmother    Lung disease Paternal Grandfather    Cancer Brother      Current Outpatient Medications:    amiodarone (PACERONE) 200 MG tablet, Take 0.5 tablets (100 mg total) by mouth daily., Disp: 60 tablet, Rfl: 6   bumetanide (BUMEX) 1 MG tablet, Take 1 tablet (1 mg total) by mouth daily.,  Disp: , Rfl:    calcitRIOL (ROCALTROL) 0.25 MCG capsule, Take 0.25 mcg by mouth daily., Disp: , Rfl:    ELIQUIS 2.5 MG TABS tablet, TAKE 1 TABLET BY MOUTH TWICE A DAY (Patient taking differently: Take 2.5 mg by mouth 2 (two) times daily.), Disp: 60 tablet, Rfl: 5   Ensure (ENSURE), Take 237 mLs by mouth daily as needed (for meal supplement)., Disp: , Rfl:    folic acid (FOLVITE) 1 MG tablet, TAKE 1 TABLET BY MOUTH EVERY DAY, Disp: 30 tablet, Rfl: 11   hydroxypropyl methylcellulose / hypromellose (ISOPTO TEARS / GONIOVISC) 2.5 % ophthalmic solution, Place 1 drop into both eyes as needed for dry eyes., Disp: , Rfl:    Multiple Vitamin (MULTIVITAMIN) tablet, Take 1 tablet by mouth daily., Disp: , Rfl:    omeprazole (PRILOSEC) 40 MG capsule, Take 1 capsule (40 mg total) by mouth 2 (two) times daily. (Patient taking differently: Take 40 mg by mouth daily.), Disp: 60 capsule, Rfl: 1   polyethylene glycol (MIRALAX / GLYCOLAX) 17 g packet, Take 17 g by mouth daily as needed for mild constipation., Disp: , Rfl:    predniSONE (DELTASONE) 5 MG tablet, Take 1 tablet (5 mg total) by mouth daily with breakfast., Disp: 90 tablet, Rfl: 1   Allergies  Allergen Reactions   Baclofen Nausea And Vomiting   Penicillin G Other (See Comments)  Unknown   Penicillins Rash     Review of Systems  Constitutional: Negative.   Eyes: Negative.   Respiratory:  Positive for cough.   Musculoskeletal:  Positive for gait problem.  Skin:  Positive for rash.  Psychiatric/Behavioral: Negative.       Today's Vitals   02/03/23 0916  BP: 122/74  Pulse: 73  Temp: 98.1 F (36.7 C)  Weight: 98 lb 6.4 oz (44.6 kg)  Height: 5\' 3"  (1.6 m)   Body mass index is 17.43 kg/m.  Wt Readings from Last 3 Encounters:  02/09/23 99 lb 9.6 oz (45.2 kg)  02/03/23 98 lb 6.4 oz (44.6 kg)  01/19/23 98 lb (44.5 kg)     Objective:  Physical Exam Pulmonary:     Effort: Pulmonary effort is normal.  Musculoskeletal:     Comments: Uses  a wheelchair  Skin:    Findings: Rash present.  Neurological:     Mental Status: She is alert and oriented to person, place, and time.  Psychiatric:        Mood and Affect: Mood normal.        Behavior: Behavior normal.         Assessment And Plan:  Rash and nonspecific skin eruption Assessment & Plan: Non itching random skin bumps, referred to Dr Emily Filbert Dermatologist that treated her some time ago   Orders: -     Ambulatory referral to Dermatology  Acquired thrombophilia (HCC) Assessment & Plan: On Eliquis 2.5 mg BID for paroxymal A-fib.   Tetanus, diphtheria, and acellular pertussis (Tdap) vaccination declined  COVID-19 vaccination declined     Return if symptoms worsen or fail to improve, for keep scheduled Appt.  Patient was given opportunity to ask questions. Patient verbalized understanding of the plan and was able to repeat key elements of the plan. All questions were answered to their satisfaction.  Edit Ricciardelli Moshe Salisbury, NP  I, Maicie Vanderloop Moshe Salisbury, NP, have reviewed all documentation for this visit. The documentation on 02/17/23 for the exam, diagnosis, procedures, and orders are all accurate and complete.   IF YOU HAVE BEEN REFERRED TO A SPECIALIST, IT MAY TAKE 1-2 WEEKS TO SCHEDULE/PROCESS THE REFERRAL. IF YOU HAVE NOT HEARD FROM US/SPECIALIST IN TWO WEEKS, PLEASE GIVE [place] A CALL AT [phone].   THE PATIENT IS ENCOURAGED TO PRACTICE SOCIAL DISTANCING DUE TO THE COVID-19 PANDEMIC.

## 2023-02-03 NOTE — Assessment & Plan Note (Signed)
On Eliquis 2.5 mg BID for paroxymal A-fib.

## 2023-02-03 NOTE — Assessment & Plan Note (Signed)
Non itching random skin bumps, referred to Dr Emily Filbert Dermatologist that treated her some time ago

## 2023-02-08 NOTE — Progress Notes (Signed)
[facility] Progress Note   PCP: [name], [name] Nephrology: [name] HF Cardiologist: [name]  77 y.o. with history of rheumatoid arthritis, CKD stage 3, and nonischemic cardiomyopathy was referred by [name] in [place] to establish heart failure care in [place].  Patient has been known to have a cardiomyopathy since 2015.  Cardiac MRI in 2015 showed EF 37% with no LGE.  Coronary angiography at that time showed no significant coronary disease.  Over the next few years, LV EF fluctuated up and down.  In 3/21, echo showed EF down to 20-25%.  She was admitted to the hospital in [place], [place] in 11/21 with CHF exacerbation.  Echo showed EF 20-25%, global hypokinesis, mildly decreased RV systolic function.  Creatinine was noted to be elevated. She was diuresed and sent home, but was readmitted later in 11/21 with ongoing volume overload. Creatinine was up to 2.89.  She was taken off most of her cardiac meds due to soft BP and elevated creatinine. She was discharged to her daughter's house in [place].   Repeated an echo on her in 12/21, EF < 20% with moderate LV dilation and mildly decreased RV systolic function. RHC was done, surprisingly showing low filling pressures and relatively preserved cardiac index at 2.22.   She was admitted in 3/22 with atrial fibrillation, converted to NSR on amiodarone. She went into atrial flutter after this and had TEE-DCCV in 4/22.  TEE showed EF < 20%, moderate LV dilation, mild RV dilation with moderate RV dysfunction, moderate central MR.  CPX in 3/22 showed severe HF limitation.  PYP scan 4/22 was grade 1 with H/CL 1.48, equivocal.  PYP scan repeated in 3/23 and likely negative.   She was evaluated for [facility] or ANTHEM, BNP too high and thought to be too frail.   Cardiac MRI in 10/22 showed mild LV dilation with EF 23%, normal RV size with RVEF 37%, ECV 34%, small area of subendocardial LGE in the mid inferolateral wall and  small area of full thickness LGE in the basal  inferior wall.   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    Follow up 6/23, BP low and midodrine started. Discussed [place]. Felt to be near end-stage HF.  Admitted 07/08/22 after mechanical fall resulting in left hip fracture. S/P left intramedullary nail 07/10/22.  Post op developed AKI. Given IV fluids . Diuretics adjusted. Echo showed EF improved to 45%. Discharged to [facility].   Admitted in 1/24 with acute on chronic systolic CHF and AKI. She required milrinone, RHC showed CI 2.81 on milrinone 0.25.  Echo showed EF < 20%.  Suspected end stage HF.   Echo 3/24 showed EF 20-25%, mild LV dilation, severely decreased RV systolic function, mild RV enlargement, severe biatrial enlargement, moderate-severe TR, moderate MR, IVC normal.   She was admitted 3/13-3/24 w/ symptomatic anemia and dyspepsia. Hgb 6.8,  transfused 2 uRBCs. GI recommended conservative management. There was no overt blood loss-FOBT was negative-gastroenterology did not feel endoscopic evaluation was warranted. She was started on Protonix w/ improvement of symptoms and relieve of  dyspepsia. Hgb remained stable post transfusion. Hospital course was c/b by volume overload. AHF team consulted and she was diuresed w/ IV Lasix, then transitioned back to Bumex.   Post hospital follow up 4/24, stable NYHA IIIb and volume stable. GDMT limited by CKD and hypotension.   At AHF f/u 3 weeks ago she was hypotensive and with decreased appetite.  Amiodarone decreased, verquvo stopped.  ReDS was low at 23%, diuretics held for several days and then resumed.   Today she returns for AHF follow up with her caregiver. Overall feeling tired (chronic). Denies palpitations, dizziness, edema, or PND/Orthopnea. Intt SOB with activity, has to pause and catch her breath. Has atypical chest pain, resolved with rest. Appetite not good, does drink ensure. Drinks coffee,  lemonade and 4 small water bottles a day. No fever or chills. Weight at home 97 pounds. Takes all her medications, does miss Eliquis every now and then as she might fall asleep before she has to take it.  Denies ETOH, smoking.    ECG (personally reviewed): NSR with PVC 80 (Personally reviewed)    ReDs: 29%  Labs (12/21): K 5 => 3.7, creatinine 2.89 => 1.9 => 1.69, BNP 2904 Labs (1/22): K 4, creatinine 1.9, hgb 12.5 Labs (3/22): K 3.6, creatinine 2.36, hgb 11.6, TSH normal Labs (4/22): K 3.7, creatinine 2.25, LFTs normal, urine immunofixation normal, pro-BNP 6498 Labs (7/22): Myeloma panel with IgG monoclonal protein Labs (10/22): K 4.1, creatinine 1.96 Labs (12/22): BNP 549, TSH normal, K 4.3, creatinine 1.78 Labs (1/23): SCr 2.61, K 4.0, Hgb 11.4  Lab (3/23): Scr 2.29, K 3.9  Labs (6/23): K 4.1, creatinine 3.82, normal LFTs, normal TSH, hgb 9.4 Labs (07/15/22): K 4.4 Creatinine 2.78  Labs (2/24): K 4.1, creatinine 2.76, hgb 8.3 Labs (6/24): K, SCR 3.44, Hgb A1c 5, tSat 29%, ferritin 566  PMH: 1. GERD 2. Rheumatoid arthritis: Followed by rheumatology in [place].  - No known pulmonary disease from RA.  3. Osteoporosis. 4. Gout 5. CKD stage 4 6. H/o SVT 7. Chronic systolic CHF: She was found to have nonischemic cardiomyopathy with low EF in 2015.  - LHC (2015): Normal coronaries.  - cardiac MRI (2015): EF 37%, no LGE - Echo (3/21): EF 20-25%. - Echo (11/21): EF 20-25%, moderate LV dilation, global hypokinesis, biatrial enlargement, mildly decreased RV systolic function.  - Echo (12/21): EF < 20%, moderate LV dilation, normal RV size/systolic function, moderate MR, IVC normal.  - RHC (1/22): Mean RA 1, PA 39/14, mean PCWP 10, PVR 3.9 WU, CI 2.22.  - CPX (3/22): peak VO2 10.1, VE/VCO2 55, RER 1.13.  Severe functional limitation due to HF.  - TEE (4/22): EF < 20%, moderate LV dilation, mild RV dilation with moderate RV dysfunction, moderate central MR. - PYP scan (4/22): grade 1  with H/CL 1.48. Equivocal. [employer] gene testing negative. - Cardiac MRI (10/22): mild LV dilation with EF 23%, normal RV size with RVEF 37%, ECV 34%, small area of subendocardial LGE in the mid inferolateral wall and small area of full thickness LGE in the basal inferior wall.  - PYP scan (3/23): grade 1, H/CL 1.3.  Probably negative.  - Echo 07/11/22: EF 45-50% RV normal  - Echo (3/24): EF 20-25%, mild LV dilation, severely decreased RV systolic function, mild RV enlargement, severe biatrial enlargement, moderate-severe TR, moderate MR, IVC normal.  8. Atrial fibrillation/flutter: TEE-DCCV in 4/22.  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  10. Varicella zoster with post-herpetic neuralgia 11. L hip fracture , mechanical fall.  12. Anemia  SH: From [place] but recently moved to [place] to live with daughter.  Has 3 children.  Nonsmoker.  No ETOH.   FH: Mother with CHF, father with MI.   ROS: All systems reviewed and negative except as per HPI.   Current Outpatient Medications  Medication Sig Dispense Refill  amiodarone (PACERONE) 200 MG tablet Take 0.5 tablets (100 mg total) by mouth daily. 60 tablet 6   bumetanide (BUMEX) 1 MG tablet Take 1 tablet (1 mg total) by mouth daily.     calcitRIOL (ROCALTROL) 0.25 MCG capsule Take 0.25 mcg by mouth daily.     ELIQUIS 2.5 MG TABS tablet TAKE 1 TABLET BY MOUTH TWICE A DAY (Patient taking differently: Take 2.5 mg by mouth 2 (two) times daily.) 60 tablet 5   Ensure (ENSURE) Take 237 mLs by mouth daily as needed (for meal supplement).     folic acid (FOLVITE) 1 MG tablet TAKE 1 TABLET BY MOUTH EVERY DAY 30 tablet 11   hydroxypropyl methylcellulose / hypromellose (ISOPTO TEARS / GONIOVISC) 2.5 % ophthalmic solution Place 1 drop into both eyes as needed for dry eyes.     Multiple Vitamin (MULTIVITAMIN) tablet Take 1 tablet by mouth daily.     omeprazole (PRILOSEC) 40 MG capsule Take 1 capsule (40 mg total) by mouth 2 (two)  times daily. (Patient taking differently: Take 40 mg by mouth daily.) 60 capsule 1   polyethylene glycol (MIRALAX / GLYCOLAX) 17 g packet Take 17 g by mouth daily as needed for mild constipation.     predniSONE (DELTASONE) 5 MG tablet Take 1 tablet (5 mg total) by mouth daily with breakfast. 90 tablet 1   No current facility-administered medications for this encounter.   Wt Readings from Last 3 Encounters:  02/09/23 45.2 kg (99 lb 9.6 oz)  02/03/23 44.6 kg (98 lb 6.4 oz)  01/19/23 44.5 kg (98 lb)   BP 102/78 (BP Location: Right Arm, Patient Position: Sitting)   Pulse 79   Ht 5\' 4"  (1.626 m)   Wt 45.2 kg (99 lb 9.6 oz)   SpO2 99%   BMI 17.10 kg/m   PHYSICAL EXAM: General:  thin, frail appearing.  No respiratory difficulty HEENT: normal Neck: supple. JVD ~6 cm. Carotids 2+ bilat; no bruits. No lymphadenopathy or thyromegaly appreciated. Cor: PMI nondisplaced. Regular rate & rhythm. No rubs, gallops or murmurs. Lungs: clear Abdomen: soft, nontender, nondistended. No hepatosplenomegaly. No bruits or masses. Good bowel sounds. Extremities: no cyanosis, clubbing, rash, edema  Neuro: alert & oriented x 3, cranial nerves grossly intact. moves all 4 extremities w/o difficulty. Affect pleasant.   Assessment/Plan: 1. Chronic Systolic CHF: Nonischemic cardiomyopathy.  This has been known since 2015, cath in 2015 showed no coronary disease and cardiac MRI in 2015 showed no LGE.  Cause is uncertain, familial cardiomyopathy is a concern given nonischemic cardiomyopathy in her mother.  Cannot rule out remote viral myocarditis.Poor appetite and severe HF limitation on CPX in 3/22 is concerning, as is elevated creatinine (suspect cardiorenal syndrome).  Low BP and elevated creatinine have limited her cardiac med regimen. RHC (1/22) showed low filling pressures and actually a relatively preserved cardiac index of 2.22.  PYP scan in 4/22 was equivocal, PYP scan repeated in 3/23 and likely negative.   Cardiac MRI in 10/22 showed mild LV dilation with EF 23%, normal RV size with RVEF 37%, ECV 34%, small area of subendocardial LGE in the mid inferolateral wall and small area of full thickness LGE in the basal inferior wall.   This was not suggestive of cardiac amyloidosis but possibly could be consistent with coronary embolism (though no LV thrombus visualized) versus prior myocarditis versus sarcoidosis. With renal failure, CHF, and atrial arrhythmias, we have assessed for cardiac amyloidosis.  PYP scan was equivocal in 4/22 for transthyretin cardiac amyloidosis.  It was repeated in 3/23 and likely negative.  [employer] gene testing for TTR mutations was negative. The cardiac MRI was not consistent with amyloidosis.  She has MGUS with monoclonal IgG paraprotein, but based on cMRI and slow progression, think AL amyloidosis is unlikely.  Most recent echo 3/24, EF 20-25%, mild LV dilation, severely decreased RV systolic function, mild RV enlargement, severe biatrial enlargement, moderate-severe TR, moderate MR, IVC normal. She was admitted in 1/24 and required milrinone.   - Chronically NYHA class IIIb symptoms and CKD stage IV.  Suspect she is end-stage.  Not a candidate for advanced therapies. She is euvolemic today, REDs 29%. - Continue Bumex 1 mg daily. - Off Verquvo with hypotension.  - Can consider midodrine, however BP more stable now that she's off verquvo.  - Off dapagliflozin with rise in SCr, with low BP would not restart.   - Off spironolactone with elevated SCr and soft BP.  - Does not have an ICD, would not place with likely end stage.   - BMET/BNP today 2. CKD: Stage 4.  She is followed by [name].  - BMET 3. Rheumatoid arthritis: No history of lung involvement. She has been on a low dose of prednisone chronically. No off Imuran, felt to be contributing to anemia  - She follows with Dr. Dimple Casey  4. Atrial fibrillation/flutter: S/p TEE-DCCV in 4/22.  She is now on amiodarone and in NSR.  -  Continue amiodarone 100 mg daily. Recent LFTs and TSH WNL (3/24). Will need regular eye exam.  - Continue Eliquis 5 mg bid. No gross bleeding but recent anemia, likely 2/2 anemia of chronic disease from CKD. Labs followed by nephrology. Getting scheduled Fe infusions. Discussed importance of not missing doses.  5. Anemia: She is receiving iron infusions per nephrology. 6. GOC: Appears end stage. Unable to tolerate much GDMT, appetite is poor and she is chronically dizzy with low BP. Long discussion about goals of care. She says she has more good days than bad. We discussed role of palliative and hospice.  Referred to Palliative care during last visit, plan to f/u with them.   Follow up in 2  months with [name] as scheduled.  Alen Bleacher, AGACNP-BC  02/09/2023

## 2023-02-09 ENCOUNTER — Encounter (HOSPITAL_COMMUNITY): Payer: Self-pay

## 2023-02-09 ENCOUNTER — Ambulatory Visit (HOSPITAL_COMMUNITY)
Admission: RE | Admit: 2023-02-09 | Discharge: 2023-02-09 | Disposition: A | Discharge status: Home / Self Care | Payer: Medicare PPO | Source: Ambulatory Visit | Attending: Internal Medicine | Admitting: Internal Medicine

## 2023-02-09 VITALS — BP 102/78 | HR 79 | Ht 64.0 in | Wt 99.6 lb

## 2023-02-09 DIAGNOSIS — R0602 Shortness of breath: Secondary | ICD-10-CM | POA: Insufficient documentation

## 2023-02-09 DIAGNOSIS — I43 Cardiomyopathy in diseases classified elsewhere: Secondary | ICD-10-CM | POA: Insufficient documentation

## 2023-02-09 DIAGNOSIS — I4891 Unspecified atrial fibrillation: Secondary | ICD-10-CM | POA: Insufficient documentation

## 2023-02-09 DIAGNOSIS — Z79899 Other long term (current) drug therapy: Secondary | ICD-10-CM | POA: Diagnosis not present

## 2023-02-09 DIAGNOSIS — I428 Other cardiomyopathies: Secondary | ICD-10-CM | POA: Diagnosis not present

## 2023-02-09 DIAGNOSIS — I5022 Chronic systolic (congestive) heart failure: Secondary | ICD-10-CM | POA: Diagnosis not present

## 2023-02-09 DIAGNOSIS — M069 Rheumatoid arthritis, unspecified: Secondary | ICD-10-CM | POA: Diagnosis not present

## 2023-02-09 DIAGNOSIS — D472 Monoclonal gammopathy: Secondary | ICD-10-CM | POA: Insufficient documentation

## 2023-02-09 DIAGNOSIS — Z7901 Long term (current) use of anticoagulants: Secondary | ICD-10-CM | POA: Diagnosis not present

## 2023-02-09 DIAGNOSIS — Z7189 Other specified counseling: Secondary | ICD-10-CM

## 2023-02-09 DIAGNOSIS — D631 Anemia in chronic kidney disease: Secondary | ICD-10-CM | POA: Insufficient documentation

## 2023-02-09 DIAGNOSIS — I480 Paroxysmal atrial fibrillation: Secondary | ICD-10-CM

## 2023-02-09 DIAGNOSIS — Z8249 Family history of ischemic heart disease and other diseases of the circulatory system: Secondary | ICD-10-CM | POA: Diagnosis not present

## 2023-02-09 DIAGNOSIS — N184 Chronic kidney disease, stage 4 (severe): Secondary | ICD-10-CM | POA: Insufficient documentation

## 2023-02-09 DIAGNOSIS — D649 Anemia, unspecified: Secondary | ICD-10-CM

## 2023-02-09 DIAGNOSIS — Z7952 Long term (current) use of systemic steroids: Secondary | ICD-10-CM | POA: Insufficient documentation

## 2023-02-09 LAB — BASIC METABOLIC PANEL
Anion gap: 10 (ref 5–15)
BUN: 54 mg/dL — ABNORMAL HIGH (ref 8–23)
CO2: 17 mmol/L — ABNORMAL LOW (ref 22–32)
Calcium: 8.9 mg/dL (ref 8.9–10.3)
Chloride: 112 mmol/L — ABNORMAL HIGH (ref 98–111)
Creatinine, Ser: 2.93 mg/dL — ABNORMAL HIGH (ref 0.44–1.00)
GFR, Estimated: 16 mL/min — ABNORMAL LOW (ref >60)
Glucose, Bld: 124 mg/dL — ABNORMAL HIGH (ref 70–99)
Potassium: 3.6 mmol/L (ref 3.5–5.1)
Sodium: 139 mmol/L (ref 135–145)

## 2023-02-09 LAB — BRAIN NATRIURETIC PEPTIDE: B Natriuretic Peptide: 996.1 pg/mL — ABNORMAL HIGH (ref 0.0–100.0)

## 2023-02-09 NOTE — Patient Instructions (Signed)
Medication Changes:  None, continue current medications  Lab Work:  Labs done today, we will call you for abnormal results  Special Instructions // Education:  Do the following things EVERYDAY: Weigh yourself in the morning before breakfast. Write it down and keep it in a log. Take your medicines as prescribed Eat low salt foods--Limit salt (sodium) to 2000 mg per day.  Stay as active as you can everyday Limit all fluids for the day to less than 2 liters   Follow-Up in: as scheduled 03/25/23 with Dr Shirlee Latch   At the [facility], you and your health needs are our priority. We have a designated team specialized in the treatment of Heart Failure. This Care Team includes your primary Heart Failure Specialized Cardiologist (physician), Advanced Practice Providers (APPs- Physician Assistants and Nurse Practitioners), and Pharmacist who all work together to provide you with the care you need, when you need it.   You may see any of the following providers on your designated Care Team at your next follow up:  Dr. Arvilla Meres Dr. Marca Ancona Dr. Marcos Eke, NP Robbie Lis, [place] [facility] [place], [place] Brynda Peon, NP Karle Plumber, PharmD   Please be sure to bring in all your medications bottles to every appointment.   Need to Contact us:  If you have any questions or concerns before your next appointment please send [place] a message through [place] or call our office at [phone].    TO LEAVE A MESSAGE FOR THE NURSE SELECT OPTION 2, PLEASE LEAVE A MESSAGE INCLUDING: YOUR NAME DATE OF BIRTH CALL BACK NUMBER REASON FOR CALL**this is important as we prioritize the call backs  YOU WILL RECEIVE A CALL BACK THE SAME DAY AS LONG AS YOU CALL BEFORE 4:00 PM

## 2023-02-17 ENCOUNTER — Ambulatory Visit (HOSPITAL_COMMUNITY)
Admission: RE | Admit: 2023-02-17 | Discharge: 2023-02-17 | Disposition: A | Discharge status: Home / Self Care | Payer: Medicare PPO | Source: Ambulatory Visit | Attending: Nephrology | Admitting: Nephrology

## 2023-02-17 VITALS — BP 93/67 | HR 93 | Temp 97.0°F | Resp 17

## 2023-02-17 DIAGNOSIS — N183 Chronic kidney disease, stage 3 unspecified: Secondary | ICD-10-CM | POA: Insufficient documentation

## 2023-02-17 LAB — IRON AND TIBC
Iron: 56 ug/dL (ref 28–170)
Saturation Ratios: 20 % (ref 10.4–31.8)
TIBC: 280 ug/dL (ref 250–450)
UIBC: 224 ug/dL

## 2023-02-17 LAB — LAB REPORT - SCANNED: EGFR: 16

## 2023-02-17 LAB — POCT HEMOGLOBIN-HEMACUE: Hemoglobin: 11.4 g/dL — ABNORMAL LOW (ref 12.0–15.0)

## 2023-02-17 LAB — FERRITIN: Ferritin: 434 ng/mL — ABNORMAL HIGH (ref 11–307)

## 2023-02-17 MED ORDER — EPOETIN ALFA-EPBX 10000 UNIT/ML IJ SOLN: 20000.0000 [IU] | INTRAMUSCULAR | Status: DC

## 2023-02-17 MED ORDER — EPOETIN ALFA-EPBX 10000 UNIT/ML IJ SOLN
INTRAMUSCULAR | Status: AC
  Administered 2023-02-17: 20000 [IU] via SUBCUTANEOUS
  Filled 2023-02-17: qty 2

## 2023-02-23 ENCOUNTER — Other Ambulatory Visit: Payer: Self-pay | Admitting: Internal Medicine

## 2023-02-23 DIAGNOSIS — M0579 Rheumatoid arthritis with rheumatoid factor of multiple sites without organ or systems involvement: Secondary | ICD-10-CM

## 2023-02-23 DIAGNOSIS — H16003 Unspecified corneal ulcer, bilateral: Secondary | ICD-10-CM

## 2023-02-23 NOTE — Telephone Encounter (Signed)
Last Fill: 09/29/2022  Next Visit: 03/30/2023  Last Visit: 12/08/2022  Dx: Rheumatoid arthritis involving multiple sites with positive rheumatoid factor   Current Dose per office note on 12/08/2022: prednisone 5 mg daily.   Okay to refill Prednisone?

## 2023-03-03 ENCOUNTER — Encounter (HOSPITAL_COMMUNITY)
Admission: RE | Admit: 2023-03-03 | Discharge: 2023-03-03 | Disposition: A | Discharge status: Home / Self Care | Payer: Medicare PPO | Source: Ambulatory Visit | Attending: Nephrology | Admitting: Nephrology

## 2023-03-03 VITALS — BP 96/73 | HR 95 | Temp 97.6°F | Resp 17

## 2023-03-03 DIAGNOSIS — N183 Chronic kidney disease, stage 3 unspecified: Secondary | ICD-10-CM | POA: Diagnosis present

## 2023-03-03 LAB — POCT HEMOGLOBIN-HEMACUE: Hemoglobin: 11.6 g/dL — ABNORMAL LOW (ref 12.0–15.0)

## 2023-03-03 MED ORDER — EPOETIN ALFA-EPBX 10000 UNIT/ML IJ SOLN
INTRAMUSCULAR | Status: AC
  Filled 2023-03-03: qty 1

## 2023-03-03 MED ORDER — EPOETIN ALFA-EPBX 10000 UNIT/ML IJ SOLN
10000.0000 [IU] | INTRAMUSCULAR | Status: DC
  Administered 2023-03-03: 10000 [IU] via SUBCUTANEOUS

## 2023-03-09 ENCOUNTER — Other Ambulatory Visit (INDEPENDENT_AMBULATORY_CARE_PROVIDER_SITE_OTHER): Payer: Medicare PPO | Admitting: Nurse Practitioner

## 2023-03-09 DIAGNOSIS — S72142D Displaced intertrochanteric fracture of left femur, subsequent encounter for closed fracture with routine healing: Secondary | ICD-10-CM

## 2023-03-09 NOTE — Progress Notes (Signed)
Chief Complaint  Patient presents with   home health initial certification   Received home health orders orders from [facility]. Start of care 08/22/2022.   Certification and orders from 08/22/2022 through 10/20/2022 are reviewed, signed and faxed back to home health company.  Need of intermittent skilled services at home: PT  The home health care plan has been established by me and will be reviewed and updated as needed to maximize patient recovery.  I certify that all home health services have been and will be furnished to the patient while under my care.  Face-to-face encounter in which the need for home health services was established: 08/27/2022  Patient is receiving home health services for the following diagnoses: Problem List Items Addressed This Visit   None Visit Diagnoses     Closed displaced intertrochanteric fracture of left femur with routine healing [S72.142D]    -  Primary        [name], [name]

## 2023-03-10 ENCOUNTER — Encounter: Payer: Medicare PPO | Admitting: Nurse Practitioner

## 2023-03-10 NOTE — Progress Notes (Deleted)
Tracy Nelson, CMA,acting as a Neurosurgeon for Tracy Felts, [name].,have documented all relevant documentation on the behalf of Tracy Felts, [name],as directed by  Tracy Felts, [name] while in the presence of Tracy Felts, [name].  Subjective:    [name] , female    DOB: December 05, 1945 , 77 y.o.   MRN: [id number]  No chief complaint on file.   HPI  Patient presents today for HM. Patient reports compliance with medication. Patient denies any chest pain, SOB, and headaches. Patient has no concerns today. Patient previously had EKG.    Past Medical History:  Diagnosis Date  . Acid reflux 07/15/2020  . Arthritis 07/15/2020  . CHF (congestive heart failure) (HCC)   . Colitis 07/15/2020  . Dilated cardiomyopathy (HCC) 07/15/2020  . HFrEF (heart failure with reduced ejection fraction) (HCC) 07/15/2020  . HTN (hypertension) 07/15/2020  . Osteoporosis 07/15/2020  . Palpitations 07/15/2020  . Rheumatoid arthritis (HCC) 07/15/2020     Family History  Problem Relation Age of Onset  . Hypertension Mother   . Diabetes Mother   . Hypertension Father   . Diabetes Father   . Breast cancer Maternal Aunt   . Breast cancer Paternal Aunt   . Arthritis Maternal Grandmother   . Lung disease Paternal Grandfather   . Cancer Brother      Current Outpatient Medications:  .  amiodarone (PACERONE) 200 MG tablet, Take 0.5 tablets (100 mg total) by mouth daily., Disp: 60 tablet, Rfl: 6 .  bumetanide (BUMEX) 1 MG tablet, Take 1 tablet (1 mg total) by mouth daily., Disp: , Rfl:  .  calcitRIOL (ROCALTROL) 0.25 MCG capsule, Take 0.25 mcg by mouth daily., Disp: , Rfl:  .  ELIQUIS 2.5 MG TABS tablet, TAKE 1 TABLET BY MOUTH TWICE A DAY (Patient taking differently: Take 2.5 mg by mouth 2 (two) times daily.), Disp: 60 tablet, Rfl: 5 .  Ensure (ENSURE), Take 237 mLs by mouth daily as needed (for meal supplement)., Disp: , Rfl:  .  folic acid (FOLVITE) 1 MG tablet, TAKE 1 TABLET BY MOUTH EVERY DAY, Disp: 30 tablet,  Rfl: 11 .  hydroxypropyl methylcellulose / hypromellose (ISOPTO TEARS / GONIOVISC) 2.5 % ophthalmic solution, Place 1 drop into both eyes as needed for dry eyes., Disp: , Rfl:  .  Multiple Vitamin (MULTIVITAMIN) tablet, Take 1 tablet by mouth daily., Disp: , Rfl:  .  omeprazole (PRILOSEC) 40 MG capsule, Take 1 capsule (40 mg total) by mouth 2 (two) times daily. (Patient taking differently: Take 40 mg by mouth daily.), Disp: 60 capsule, Rfl: 1 .  polyethylene glycol (MIRALAX / GLYCOLAX) 17 g packet, Take 17 g by mouth daily as needed for mild constipation., Disp: , Rfl:  .  predniSONE (DELTASONE) 5 MG tablet, TAKE 1 TABLET BY MOUTH EVERY DAY WITH BREAKFAST, Disp: 90 tablet, Rfl: 0   Allergies  Allergen Reactions  . Baclofen Nausea And Vomiting  . Penicillin G Other (See Comments)    Unknown  . Penicillins Rash      The patient states she uses {contraceptive methods:5051} for birth control. No LMP recorded. Patient is postmenopausal.. {Dysmenorrhea-menorrhagia:21918}. Negative for: breast discharge, breast lump(s), breast pain and breast self exam. Associated symptoms include abnormal vaginal bleeding. Pertinent negatives include abnormal bleeding (hematology), anxiety, decreased libido, depression, difficulty falling sleep, dyspareunia, history of infertility, nocturia, sexual dysfunction, sleep disturbances, urinary incontinence, urinary urgency, vaginal discharge and vaginal itching. Diet regular.The patient states her exercise level is    . The  patient's tobacco use is:  Social History   Tobacco Use  Smoking Status Never  Smokeless Tobacco Never  . She has been exposed to passive smoke. The patient's alcohol use is:  Social History   Substance and Sexual Activity  Alcohol Use Never  . Additional information: Last pap ***, next one scheduled for ***.    Review of Systems   There were no vitals filed for this visit. There is no height or weight on file to calculate BMI.  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 Patient was given opportunity to ask questions. Patient verbalized understanding of the plan and was able to repeat key elements of the plan. All questions were answered to their satisfaction.   Tracy Felts, [name]  I, Tracy Felts, [name], have reviewed all documentation for this visit. The documentation on 03/10/23 for the exam, diagnosis, procedures, and orders are all accurate and complete.

## 2023-03-16 NOTE — Progress Notes (Signed)
Office Visit Note  Patient: Tracy Nelson             Date of Birth: 12-Feb-1946           MRN: [id number]             PCP: [name], [name] Referring: [name], [name] Visit Date: 03/30/2023   Subjective:  Follow-up (Patient states her pinky fingers on both hands and hurting and stiff and her index finger on the right hand is swollen. )   History of Present Illness: Tracy Nelson is a 77 y.o. 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  Still has daily ongoing joint pain and stiffness in both hands with frequent swelling in the right index finger.  Also with left shoulder pain mostly when she is trying to reach overhead.  Joint aches are most prominent at nighttime.  Also has tenderness to pressure over her shins on both sides without much swelling.  Patient's daughter was present for visit today assisting with transportation.  Previous HPI 12/08/2022 Tracy Nelson is a 77 y.o. 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  Her azathioprine was discontinued after hospitalization in March due to symptomatic anemia thought in large part due to bone marrow suppression.  She has noticed some increase in joint pain and stiffness since she had the stopped azathioprine.  Biggest problem is limiting her is the decrease strength and mobility in her hands and also the left shoulder pain.   Previous HPI 09/29/22 Tracy Nelson is a 77 y.o. 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  Hydroxychloroquine was previously discontinued that she took a much of last year but had concern by ophthalmologist for safety of retinal toxicity monitoring.  She is recently out of the prednisone due to medication refill.  Since stopping  the medication she is noticing substantial increase in fatigue and joint pain and stiffness worse in multiple areas.  Especially bilateral upper extremities doing worse.   Previous HPI 09/23/21 Tracy Nelson is a 77 y.o. female here for follow up for seronegative RA complicated with corneal ulcerations we increased azathioprine dose to 75 mg daily and increased prednisone temporarily. I discussed plan with Dr. Noel Gerold also planning to discontinue hydroxychloroquine over concern about risk and ability to monitor safety. She continues to have eye inflammation worse on the left side. She notices some eye pain and headaches associated with blurry vision. Often around using eye drops or first thing in the morning. She had lab work last month for Dr. Ronelle Nigh with normal blood counts renal function appeared slightly worse.   Previous HPI 08/20/21 Tracy Nelson is a 77 y.o. female here for follow up for seronegative RA on HCQ 200 mg daily and prednisone 5 mg daily. Labs checked at hospital December 2022 with eGFR stable at 29. Joint symptoms are not significantly changed since last visit. However she saw ophthalmology in December with increased eye irritation and redness found to have ulceration in the left eye consistent with active RA. She also continued restasis and artificial tears for severe dryness. She reports seeing some darkening or rash on her face does not have any abnormal sensation.   Previous HPI 01/09/21 Tracy Nelson is a 77 y.o. female here for seronegative  rheumatoid arthritis previously a patient with Dr. Steele Berg in [place], [place]. She currently taken hydroxychloroquine 200 mg PO daily and prednisone 5 mg PO daily and has not experienced a severe flare or increase in steroid dosing since about a year ago.  She also experiences sicca symptoms currently treated with Restasis.  She was previously taking Imuran but is not clear if she is still taking this and how much is contributory to disease  control. 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  She has also previously undergone some fibromyalgia tender point injection with significant benefit of back pain symptoms. 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 She has upcoming nephrology appointment next week for her CKD and due to frequent hard sticks prefers if lab draws may be consolidated with frequent bruising and discomfort associated.   Review of Systems  Constitutional:  Positive for fatigue.  HENT:  Positive for mouth dryness. Negative for mouth sores.   Eyes:  Positive for dryness.  Respiratory:  Positive for shortness of breath.   Cardiovascular:  Positive for chest pain. Negative for palpitations.  Gastrointestinal:  Negative for blood in stool, constipation and diarrhea.  Endocrine: Positive for increased urination.  Genitourinary:  Negative for involuntary urination.  Musculoskeletal:  Positive for joint pain, joint pain, joint swelling, muscle weakness, morning stiffness and muscle tenderness. Negative for gait problem, myalgias and myalgias.  Skin:  Positive for sensitivity to sunlight. Negative for color change, rash and hair loss.  Allergic/Immunologic: Negative for susceptible to infections.  Neurological:  Positive for dizziness and headaches.  Hematological:  Negative for swollen glands.  Psychiatric/Behavioral:  Positive for sleep disturbance. Negative for depressed mood. The patient is not nervous/anxious.     PMFS History:  Patient Active Problem List   Diagnosis Date Noted   Bumps on skin 02/03/2023   Rash and nonspecific skin eruption 02/03/2023   Acquired thrombophilia (HCC) 02/03/2023   Vertigo 01/13/2023   Prediabetes 01/13/2023   Abdominal discomfort 11/02/2022   Belching 11/02/2022   Acute on chronic anemia 10/30/2022   Hypokalemia 10/30/2022    Prolonged QT interval 10/30/2022   Cataract 09/14/2022   Neoplasm of uncertain behavior of skin 09/14/2022   Uterine prolapse 09/14/2022   Congestive heart failure (HCC) 09/03/2022   Pressure ulcer 09/03/2022   Acute on chronic HFrEF (heart failure with reduced ejection fraction) (HCC) 08/30/2022   Elevated d-dimer 08/30/2022   Anemia of chronic disease 08/30/2022   Paroxysmal atrial fibrillation (HCC) 08/30/2022   Chronic systolic CHF (congestive heart failure) (HCC) 07/13/2022   CKD (chronic kidney disease), stage IV (HCC) 07/09/2022   Displaced intertrochanteric fracture of left femur, initial encounter for closed fracture (HCC) 07/08/2022   Corneal melt, 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   Monoclonal gammopathy 03/10/2021   Hypotension 10/22/2020   Atypical chest pain 10/22/2020   CKD (chronic kidney disease), stage III (HCC) 10/22/2020   Nausea & vomiting 10/22/2020   Physical deconditioning 10/22/2020   Atrial flutter (HCC) 10/18/2020   Arthritis 07/15/2020   GERD (gastroesophageal reflux disease) 07/15/2020   Colitis 07/15/2020   Non-ischemic cardiomyopathy (HCC) 07/15/2020   HFrEF (heart failure with reduced ejection fraction) (HCC) 07/15/2020   HTN (hypertension) 07/15/2020   Osteoporosis 07/15/2020   Palpitations 07/15/2020   Acute gout of left shoulder 07/10/2020   Raynaud's disease 05/05/2019   Rheumatoid arthritis involving multiple sites with positive rheumatoid factor (HCC) 10/28/2015   Mitral regurgitation 05/30/2014  Acute on chronic combined systolic (congestive) and diastolic (congestive) heart failure (HCC) 05/19/2014   Vitamin D deficiency 05/15/2013   Encounter for long-term (current) use of other medications 12/14/2008   Sjogren's syndrome (HCC) 12/14/2008    Past Medical History:  Diagnosis Date   Acid reflux 07/15/2020   Arthritis 07/15/2020   CHF (congestive heart failure) (HCC)    Colitis 07/15/2020   Dilated cardiomyopathy (HCC) 07/15/2020   HFrEF (heart  failure with reduced ejection fraction) (HCC) 07/15/2020   HTN (hypertension) 07/15/2020   Osteoporosis 07/15/2020   Palpitations 07/15/2020   Rheumatoid arthritis (HCC) 07/15/2020    Family History  Problem Relation Age of Onset   Hypertension Mother    Diabetes Mother    Hypertension Father    Diabetes Father    Breast cancer Maternal Aunt    Breast cancer Paternal Aunt    Arthritis Maternal Grandmother    Lung disease Paternal Grandfather    Cancer Brother    Past Surgical History:  Procedure Laterality Date   CARDIOVERSION N/A 11/08/2020   Procedure: CARDIOVERSION;  Surgeon: Laurey Morale, MD;  Location: [facility] ENDOSCOPY;  Service: Cardiovascular;  Laterality: N/A;   HIP FRACTURE SURGERY     INTRAMEDULLARY (IM) NAIL INTERTROCHANTERIC Left 07/10/2022   Procedure: INTRAMEDULLARY (IM) NAIL INTERTROCHANTERIC;  Surgeon: Tarry Kos, MD;  Location: [facility] OR;  Service: Orthopedics;  Laterality: Left;   RIGHT HEART CATH N/A 08/14/2020   Procedure: RIGHT HEART CATH;  Surgeon: Laurey Morale, MD;  Location: [facility] INVASIVE CV LAB;  Service: Cardiovascular;  Laterality: N/A;   RIGHT HEART CATH N/A 09/03/2022   Procedure: RIGHT HEART CATH;  Surgeon: Laurey Morale, MD;  Location: [facility] INVASIVE CV LAB;  Service: Cardiovascular;  Laterality: N/A;   TEE WITHOUT CARDIOVERSION N/A 11/08/2020   Procedure: TRANSESOPHAGEAL ECHOCARDIOGRAM (TEE);  Surgeon: Laurey Morale, MD;  Location: [facility] ENDOSCOPY;  Service: Cardiovascular;  Laterality: N/A;   Social History   Social History Narrative   Not on file   There is no immunization history for the selected administration types on file for this patient.   Objective: Vital Signs: BP 106/75 (BP Location: Left Arm, Patient Position: Sitting, Cuff Size: Normal)   Pulse 91   Resp 14   Ht 5\' 4"  (1.626 m)   Wt 96 lb (43.5 kg) Comment: patient in wheelchair  BMI 16.48 kg/m    Physical Exam Constitutional:      Comments: In wheelchair  Eyes:      Conjunctiva/sclera: Conjunctivae normal.  Cardiovascular:     Rate and Rhythm: Normal rate and regular rhythm.  Pulmonary:     Effort: Pulmonary effort is normal.     Comments: Inspiratory crackles in lower lung fields Musculoskeletal:     Right lower leg: No edema.     Left lower leg: No edema.  Lymphadenopathy:     Cervical: No cervical adenopathy.  Skin:    General: Skin is warm and dry.  Neurological:     Mental Status: She is alert.  Psychiatric:        Mood and Affect: Mood normal.      Musculoskeletal Exam:  Left shoulder tenderness to pressure worst anteriorly, pain with active range of motion but tolerates full passive abduction and external rotation Elbows full ROM no tenderness or swelling Wrists full ROM no tenderness or swelling Fingers chronic MCP joint widening of both hands with subluxation and lateral deviation partially reducible, mild swelling at the second MCP with overlying discoloration, tenderness  to pressure at second third MCPs, tenderness to pressure at fifth PIP of both hands with mild triggering Knees full ROM no tenderness or swelling Tenderness to pressure over lower half of shins on both sides   CDAI Exam: CDAI Score: 11  Patient Global: 30 / 100; Provider Global: 20 / 100 Swollen: 1 ; Tender: 5  Joint Exam 03/30/2023      Right  Left  Glenohumeral      Tender  MCP 2  Swollen Tender     MCP 3   Tender     PIP 5 (finger)   Tender   Tender     Investigation: No additional findings.  Imaging: No results found.  Recent Labs: Lab Results  Component Value Date   WBC 7.4 11/25/2022   HGB 11.2 (L) 03/17/2023   PLT 181 11/25/2022   NA 135 03/25/2023   K 3.8 03/25/2023   CL 107 03/25/2023   CO2 19 (L) 03/25/2023   GLUCOSE 98 03/25/2023   BUN 51 (H) 03/25/2023   CREATININE 2.45 (H) 03/25/2023   BILITOT 0.3 11/04/2022   ALKPHOS 62 11/04/2022   AST 21 11/04/2022   ALT 18 11/04/2022   PROT 4.9 (L) 11/04/2022   ALBUMIN 3.6 11/18/2022    CALCIUM 8.9 03/25/2023    Speciality Comments: No specialty comments available.  Procedures:  No procedures performed Allergies: Baclofen, Penicillin g, and Penicillins   Assessment / Plan:     Visit Diagnoses: Rheumatoid arthritis involving multiple sites with positive rheumatoid factor (HCC) - Plan: predniSONE (DELTASONE) 5 MG tablet, gabapentin (NEURONTIN) 100 MG capsule  Continued joint pain in multiple areas a lot looks like chronic changes there is some active synovitis in at least the right second MCP though.  Steroid sparing treatment is limited due to multiple side effects or medication intolerance.  Not interested in any steroid joint injection.  Plan to continue prednisone 5 mg daily.  Recommend trial of adding gabapentin 100 mg at night for arthritis pain and also the lower extremity pain that appears more related to neuropathy or circulation cause.  Long term (current) use of systemic steroids - prednisone 5 mg daily. CKD (chronic kidney disease), stage IV (HCC) - Not a good candidate for methotrexate or leflunomide and unable to use NSAID medications due to chronic renal insufficiency.  Had recent labs last month hemoglobin was stable around 10 in the labs okay.  Tolerating prednisone 5 mg daily very well no new increase in bruising or fluid retention.  No serious interval infections.  Corneal melt, bilateral - recommended avoidance of hydroxychloroquine proceeding forward as per ophthalmology.  No new vision complications reported.  Waiting on low-dose prednisone unfortunately not tolerant of steroid sparing DMARD treatments.   Orders: No orders of the defined types were placed in this encounter.  Meds ordered this encounter  Medications   predniSONE (DELTASONE) 5 MG tablet    Sig: Take 1 tablet (5 mg total) by mouth daily with breakfast.    Dispense:  90 tablet    Refill:  1   gabapentin (NEURONTIN) 100 MG capsule    Sig: Take 1 capsule (100 mg total) by mouth at  bedtime.    Dispense:  30 capsule    Refill:  5     Follow-Up Instructions: Return in about 6 months (around 09/30/2023) for RA on GC f/u 6mos.   Fuller Plan, MD  Note - This record has been created using [employer].  Chart creation errors have been  sought, but may not always  have been located. Such creation errors do not reflect on  the standard of medical care.

## 2023-03-17 ENCOUNTER — Encounter (HOSPITAL_COMMUNITY)
Admission: RE | Admit: 2023-03-17 | Discharge: 2023-03-17 | Disposition: A | Discharge status: Home / Self Care | Payer: Medicare PPO | Source: Ambulatory Visit | Attending: Nephrology | Admitting: Nephrology

## 2023-03-17 VITALS — BP 94/68 | HR 105 | Temp 97.4°F | Resp 17

## 2023-03-17 DIAGNOSIS — N183 Chronic kidney disease, stage 3 unspecified: Secondary | ICD-10-CM | POA: Insufficient documentation

## 2023-03-17 DIAGNOSIS — Z7952 Long term (current) use of systemic steroids: Secondary | ICD-10-CM | POA: Diagnosis not present

## 2023-03-17 DIAGNOSIS — N184 Chronic kidney disease, stage 4 (severe): Secondary | ICD-10-CM | POA: Insufficient documentation

## 2023-03-17 DIAGNOSIS — H16003 Unspecified corneal ulcer, bilateral: Secondary | ICD-10-CM | POA: Insufficient documentation

## 2023-03-17 DIAGNOSIS — M0579 Rheumatoid arthritis with rheumatoid factor of multiple sites without organ or systems involvement: Secondary | ICD-10-CM | POA: Diagnosis not present

## 2023-03-17 LAB — IRON AND TIBC
Iron: 44 ug/dL (ref 28–170)
Saturation Ratios: 20 % (ref 10.4–31.8)
TIBC: 225 ug/dL — ABNORMAL LOW (ref 250–450)
UIBC: 181 ug/dL

## 2023-03-17 LAB — FERRITIN: Ferritin: 266 ng/mL (ref 11–307)

## 2023-03-17 LAB — POCT HEMOGLOBIN-HEMACUE: Hemoglobin: 11.2 g/dL — ABNORMAL LOW (ref 12.0–15.0)

## 2023-03-17 MED ORDER — EPOETIN ALFA-EPBX 10000 UNIT/ML IJ SOLN
10000.0000 [IU] | INTRAMUSCULAR | Status: DC
  Administered 2023-03-17: 10000 [IU] via SUBCUTANEOUS

## 2023-03-17 MED ORDER — EPOETIN ALFA-EPBX 10000 UNIT/ML IJ SOLN
INTRAMUSCULAR | Status: AC
  Filled 2023-03-17: qty 1

## 2023-03-19 ENCOUNTER — Telehealth: Payer: Self-pay

## 2023-03-19 DIAGNOSIS — Z515 Encounter for palliative care: Secondary | ICD-10-CM

## 2023-03-19 NOTE — Telephone Encounter (Signed)
Phone call visit opened in error. No call made to pt.

## 2023-03-22 DIAGNOSIS — M6281 Muscle weakness (generalized): Secondary | ICD-10-CM | POA: Diagnosis not present

## 2023-03-22 DIAGNOSIS — S72002D Fracture of unspecified part of neck of left femur, subsequent encounter for closed fracture with routine healing: Secondary | ICD-10-CM | POA: Diagnosis not present

## 2023-03-22 DIAGNOSIS — N184 Chronic kidney disease, stage 4 (severe): Secondary | ICD-10-CM | POA: Diagnosis not present

## 2023-03-22 DIAGNOSIS — R278 Other lack of coordination: Secondary | ICD-10-CM | POA: Diagnosis not present

## 2023-03-22 DIAGNOSIS — I5022 Chronic systolic (congestive) heart failure: Secondary | ICD-10-CM | POA: Diagnosis not present

## 2023-03-24 IMAGING — NM NM SCAN TUMOR LOCALIZE WITH SPECT
3 series · 13 of 13 positions shown · non-contrast
Comparison: 12/06/2020

CLINICAL DATA: Cardiomyopathy, follow-up, non ischemic
disease

EXAM:
NUCLEAR MEDICINE TUMOR LOCALIZATION. PYP CARDIAC AMYLOIDOSIS SCAN
WITH SPECT
TECHNIQUE: Following intravenous administration of radiopharmaceutical,
anterior planar images of the chest were obtained. Regions of
interest were placed on the heart and contralateral chest wall for
quantitative assessment. Additional SPECT imaging of the chest was
obtained.
RADIOPHARMACEUTICALS:  21.2 mCi TECHNETIUM 99 PYROPHOSPHATE

[Series 1: spect - (id)_(id)_cor · 4.1mm · 4.14mm/px · 6 of 128 frames shown]
[frame 11/128]
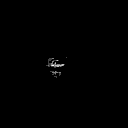
[frame 32/128]
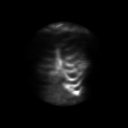
[frame 54/128]
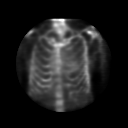
[frame 75/128]
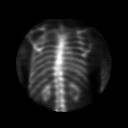
[frame 96/128]
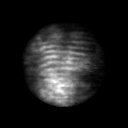
[frame 118/128]
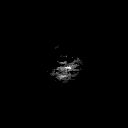

[Series 1: spect - (id)_(id)_tra · 4.1mm · 4.14mm/px · 6 of 128 frames shown]
[frame 11/128]
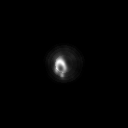
[frame 32/128]
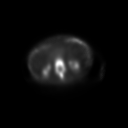
[frame 54/128]
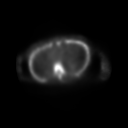
[frame 75/128]
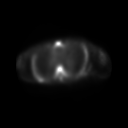
[frame 96/128]
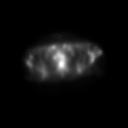
[frame 118/128]
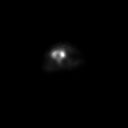

[Series 1: amyloid region ratio ss · 1 of 1 slices shown]
[im 1/1]
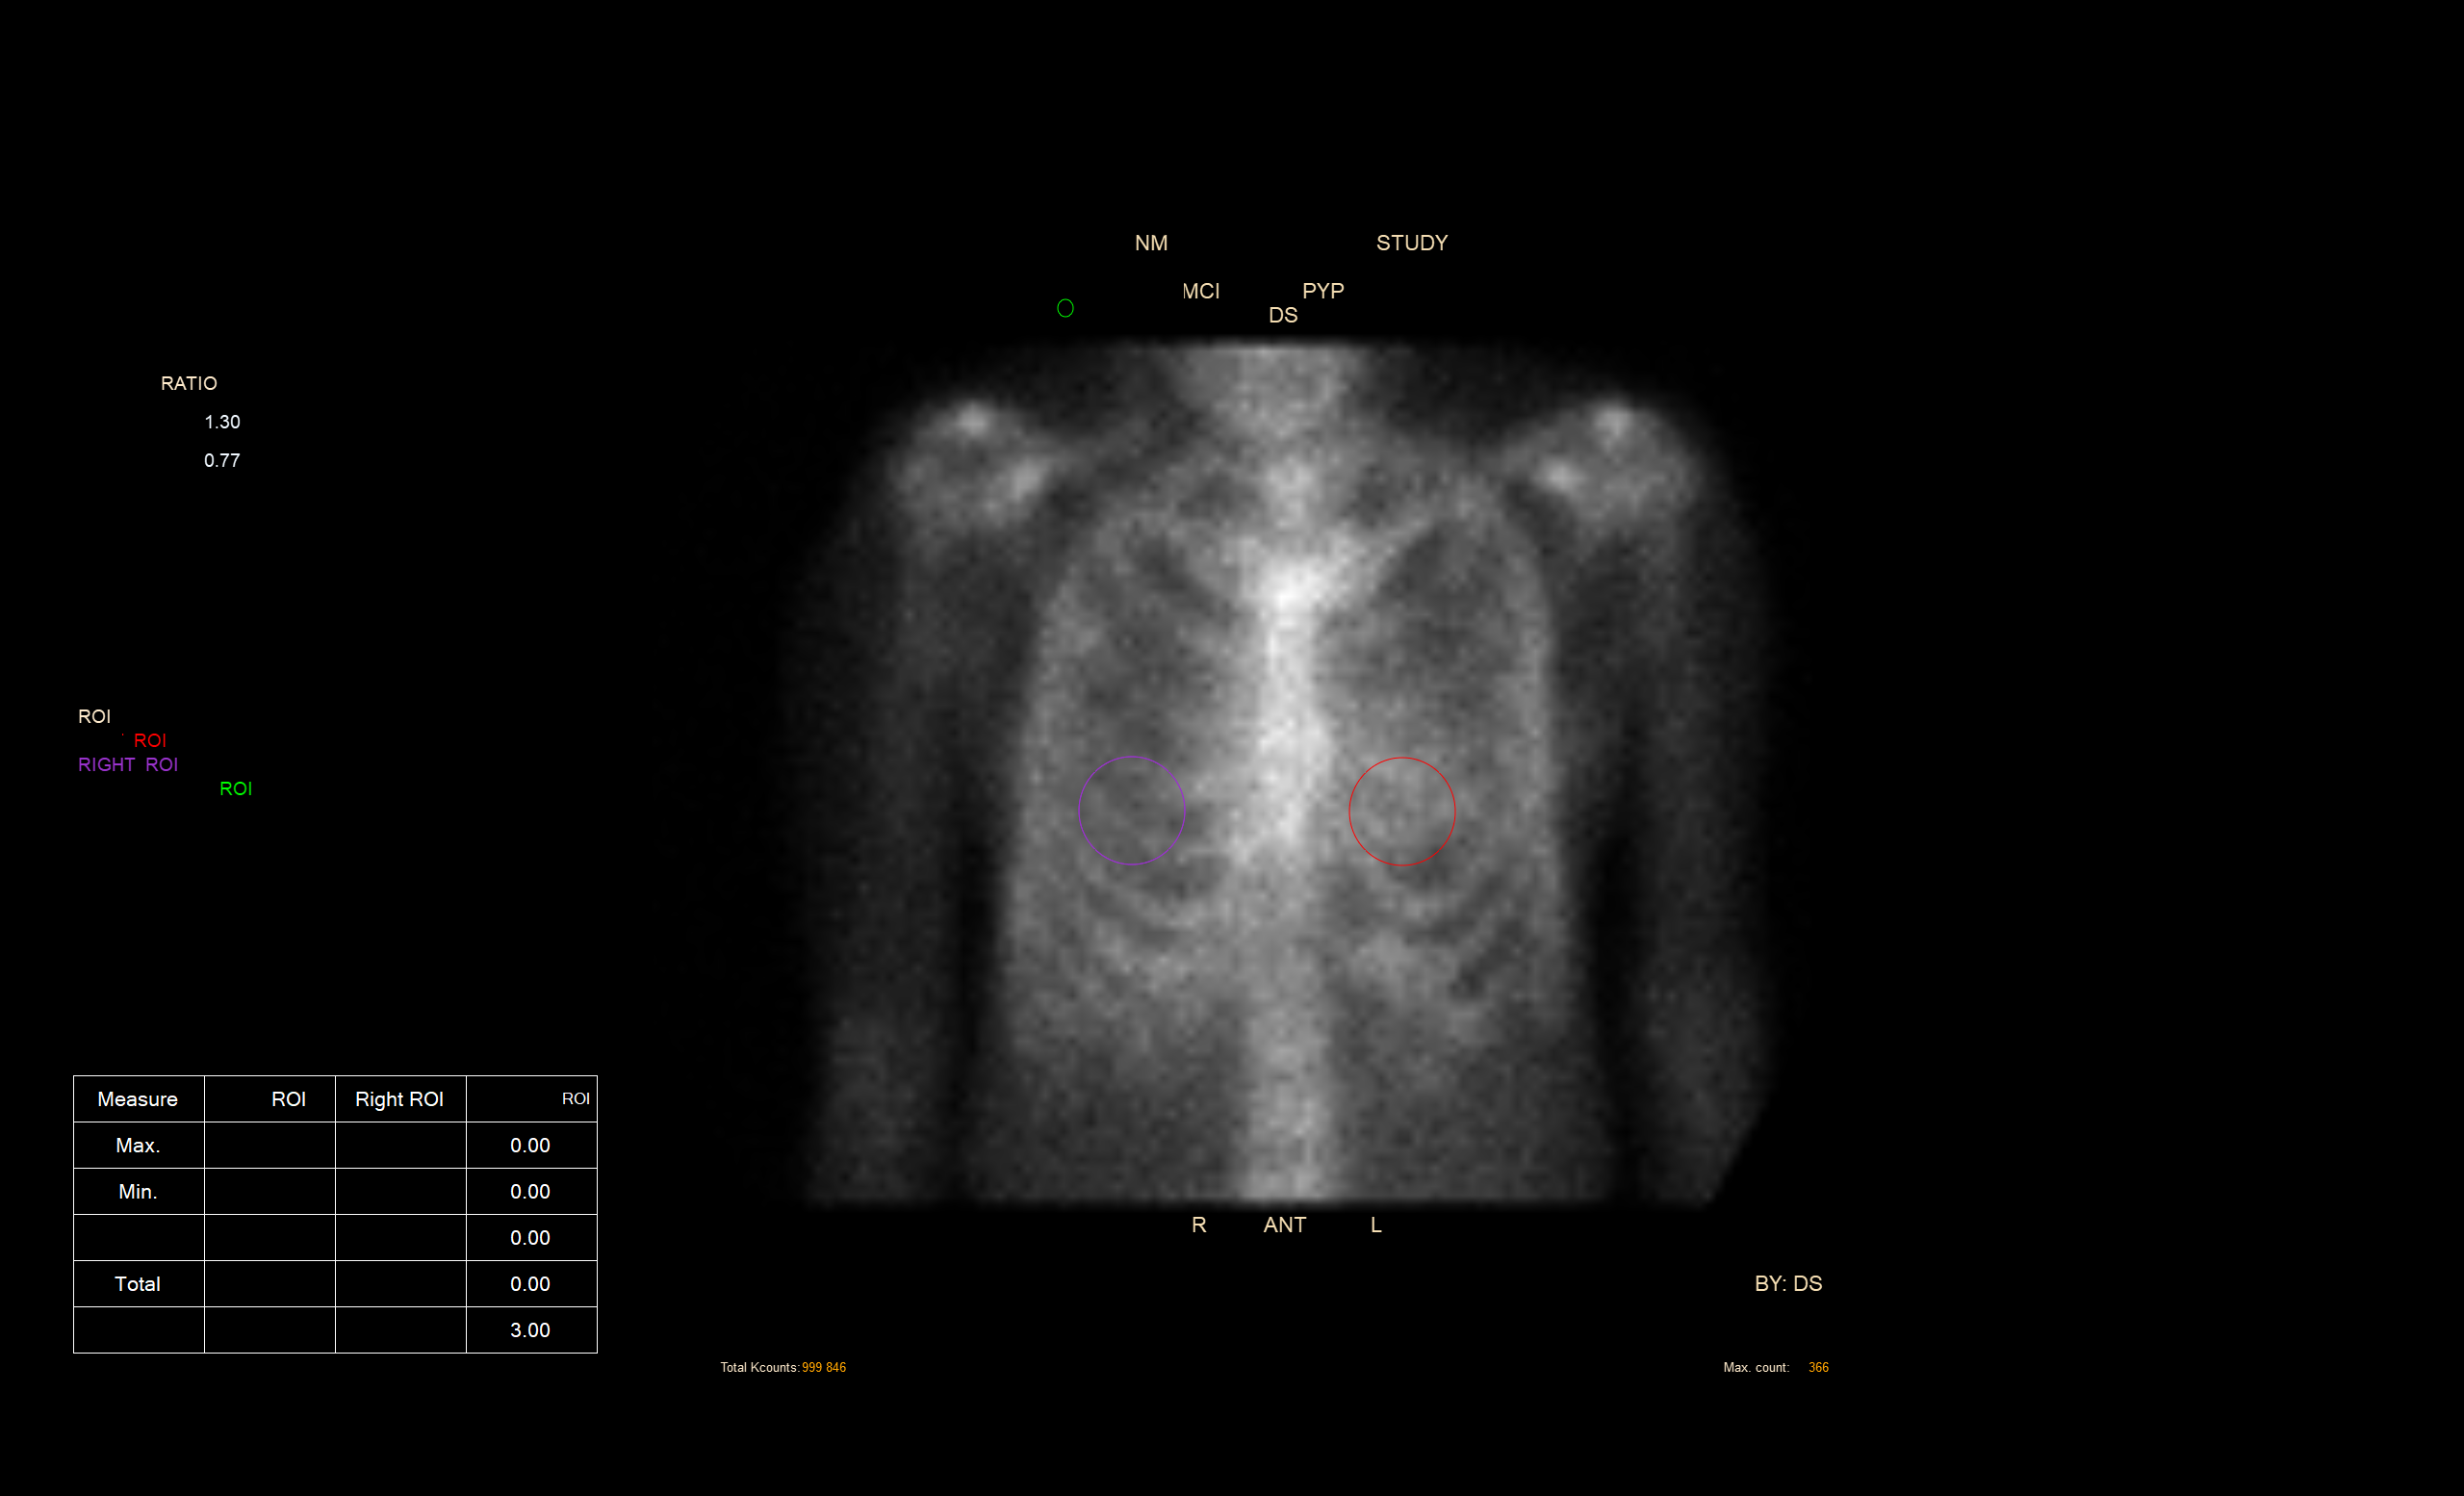

[13 of 13 positions shown; findings below may reference images not displayed]

FINDINGS: Planar Visual assessment:

Anterior planar imaging demonstrates radiotracer uptake within the
heart less than uptake within the adjacent ribs (Grade 1).

Quantitative assessment :

Quantitative assessment of the cardiac uptake compared to the
contralateral chest wall is equal to

(H/CL = 1.30).

SPECT assessment: SPECT imaging of the chest demonstrates minimal
radiotracer accumulation within the LEFT ventricle.
IMPRESSION: Visual and quantitative assessment (grade 1, H/CL = 1.30) is
equivocal for transthyretin amyloidosis.

## 2023-03-25 ENCOUNTER — Ambulatory Visit (HOSPITAL_COMMUNITY)
Admission: RE | Admit: 2023-03-25 | Discharge: 2023-03-25 | Disposition: A | Discharge status: Home / Self Care | Payer: Medicare PPO | Source: Ambulatory Visit | Attending: Cardiology | Admitting: Cardiology

## 2023-03-25 ENCOUNTER — Encounter (HOSPITAL_COMMUNITY): Payer: Self-pay | Admitting: Cardiology

## 2023-03-25 VITALS — BP 102/60 | HR 89 | Wt 102.0 lb

## 2023-03-25 DIAGNOSIS — N184 Chronic kidney disease, stage 4 (severe): Secondary | ICD-10-CM | POA: Diagnosis not present

## 2023-03-25 DIAGNOSIS — I5022 Chronic systolic (congestive) heart failure: Secondary | ICD-10-CM | POA: Insufficient documentation

## 2023-03-25 DIAGNOSIS — I4891 Unspecified atrial fibrillation: Secondary | ICD-10-CM | POA: Insufficient documentation

## 2023-03-25 DIAGNOSIS — Z79899 Other long term (current) drug therapy: Secondary | ICD-10-CM | POA: Insufficient documentation

## 2023-03-25 DIAGNOSIS — Z7952 Long term (current) use of systemic steroids: Secondary | ICD-10-CM | POA: Insufficient documentation

## 2023-03-25 DIAGNOSIS — I428 Other cardiomyopathies: Secondary | ICD-10-CM | POA: Insufficient documentation

## 2023-03-25 DIAGNOSIS — Z8249 Family history of ischemic heart disease and other diseases of the circulatory system: Secondary | ICD-10-CM | POA: Diagnosis not present

## 2023-03-25 DIAGNOSIS — R0602 Shortness of breath: Secondary | ICD-10-CM | POA: Diagnosis present

## 2023-03-25 DIAGNOSIS — D472 Monoclonal gammopathy: Secondary | ICD-10-CM | POA: Insufficient documentation

## 2023-03-25 DIAGNOSIS — Z7901 Long term (current) use of anticoagulants: Secondary | ICD-10-CM | POA: Insufficient documentation

## 2023-03-25 DIAGNOSIS — D649 Anemia, unspecified: Secondary | ICD-10-CM | POA: Insufficient documentation

## 2023-03-25 DIAGNOSIS — I43 Cardiomyopathy in diseases classified elsewhere: Secondary | ICD-10-CM | POA: Diagnosis not present

## 2023-03-25 DIAGNOSIS — M069 Rheumatoid arthritis, unspecified: Secondary | ICD-10-CM | POA: Insufficient documentation

## 2023-03-25 DIAGNOSIS — R531 Weakness: Secondary | ICD-10-CM | POA: Diagnosis not present

## 2023-03-25 DIAGNOSIS — M25552 Pain in left hip: Secondary | ICD-10-CM | POA: Diagnosis not present

## 2023-03-25 LAB — BASIC METABOLIC PANEL
Anion gap: 9 (ref 5–15)
BUN: 51 mg/dL — ABNORMAL HIGH (ref 8–23)
CO2: 19 mmol/L — ABNORMAL LOW (ref 22–32)
Calcium: 8.9 mg/dL (ref 8.9–10.3)
Chloride: 107 mmol/L (ref 98–111)
Creatinine, Ser: 2.45 mg/dL — ABNORMAL HIGH (ref 0.44–1.00)
GFR, Estimated: 20 mL/min — ABNORMAL LOW (ref >60)
Glucose, Bld: 98 mg/dL (ref 70–99)
Potassium: 3.8 mmol/L (ref 3.5–5.1)
Sodium: 135 mmol/L (ref 135–145)

## 2023-03-25 LAB — BRAIN NATRIURETIC PEPTIDE: B Natriuretic Peptide: 2122 pg/mL — ABNORMAL HIGH (ref 0.0–100.0)

## 2023-03-25 NOTE — Progress Notes (Signed)
[facility] Progress Note   PCP: [name], [name] Nephrology: [name] HF Cardiologist: [name]  77 y.o. with history of rheumatoid arthritis, CKD stage 3, and nonischemic cardiomyopathy was referred by [name] in [place] to establish heart failure care in [place].  Patient has been known to have a cardiomyopathy since 2015.  Cardiac MRI in 2015 showed EF 37% with no LGE.  Coronary angiography at that time showed no significant coronary disease.  Over the next few years, LV EF fluctuated up and down.  In 3/21, echo showed EF down to 20-25%.  She was admitted to the hospital in [place], [place] in 11/21 with CHF exacerbation.  Echo showed EF 20-25%, global hypokinesis, mildly decreased RV systolic function.  Creatinine was noted to be elevated. She was diuresed and sent home, but was readmitted later in 11/21 with ongoing volume overload. Creatinine was up to 2.89.  She was taken off most of her cardiac meds due to soft BP and elevated creatinine. She was discharged to her daughter's house in [place].   Repeated an echo on her in 12/21, EF < 20% with moderate LV dilation and mildly decreased RV systolic function. RHC was done, surprisingly showing low filling pressures and relatively preserved cardiac index at 2.22.   She was admitted in 3/22 with atrial fibrillation, converted to NSR on amiodarone. She went into atrial flutter after this and had TEE-DCCV in 4/22.  TEE showed EF < 20%, moderate LV dilation, mild RV dilation with moderate RV dysfunction, moderate central MR.  CPX in 3/22 showed severe HF limitation.  PYP scan 4/22 was grade 1 with H/CL 1.48, equivocal.  PYP scan repeated in 3/23 and likely negative.   She was evaluated for [facility] or ANTHEM, BNP too high and thought to be too frail.   Cardiac MRI in 10/22 showed mild LV dilation with EF 23%, normal RV size with RVEF 37%, ECV 34%, small area of subendocardial LGE in the mid inferolateral wall and  small area of full thickness LGE in the basal  inferior wall.   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    Admitted 07/08/22 after mechanical fall resulting in left hip fracture. S/P left intramedullary nail 07/10/22.  Post op developed AKI. Given IV fluids . Diuretics adjusted. Echo showed EF improved to 45%. Discharged to [facility].   Admitted in 1/24 with acute on chronic systolic CHF and AKI. She required milrinone, RHC showed CI 2.81 on milrinone 0.25.  Echo showed EF < 20%.  Suspected end stage HF.   Echo 3/24 showed EF 20-25%, mild LV dilation, severely decreased RV systolic function, mild RV enlargement, severe biatrial enlargement, moderate-severe TR, moderate MR, IVC normal.   She was admitted 3/13-3/24 w/ symptomatic anemia and dyspepsia. Hgb 6.8,  transfused 2 uRBCs. GI recommended conservative management. There was no overt blood loss-FOBT was negative-gastroenterology did not feel endoscopic evaluation was warranted. She was started on Protonix w/ improvement of symptoms and relieve of  dyspepsia. Hgb remained stable post transfusion. Hospital course was c/b by volume overload. AHF team consulted and she was diuresed w/ IV Lasix, then transitioned back to Bumex.   GDMT limited by CKD and hypotension. Verquvo stopped.   She returns for followup of CHF.  She remains very frail.  Weight up about 3 lbs.  She feels weak, able to get around the house with her walker.  She is not short of breath walking in  the house but does get short of breath dressing, bathing.  She is also limited by left hip pain.  She has chest burning at times when she lies down in bed.  This is improved by standing and walking.  No palpitations.     ECG (personally reviewed): NSR , PVC, LAFB, old anterior and inferior MIs   ReDs: 28%  Labs (12/21): K 5 => 3.7, creatinine 2.89 => 1.9 => 1.69, BNP 2904 Labs (1/22): K 4, creatinine 1.9, hgb 12.5 Labs (3/22): K 3.6,  creatinine 2.36, hgb 11.6, TSH normal Labs (4/22): K 3.7, creatinine 2.25, LFTs normal, urine immunofixation normal, pro-BNP 6498 Labs (7/22): Myeloma panel with IgG monoclonal protein Labs (10/22): K 4.1, creatinine 1.96 Labs (12/22): BNP 549, TSH normal, K 4.3, creatinine 1.78 Labs (1/23): SCr 2.61, K 4.0, Hgb 11.4  Lab (3/23): Scr 2.29, K 3.9  Labs (6/23): K 4.1, creatinine 3.82, normal LFTs, normal TSH, hgb 9.4 Labs (07/15/22): K 4.4 Creatinine 2.78  Labs (2/24): K 4.1, creatinine 2.76, hgb 8.3 Labs (6/24): SCR 3.44, Hgb A1c 5, tSat 29%, ferritin 566 Labs (7/24): K 3.6, creatinine 2.93, BNP 996 Labs (8/24): hgb 11.2  PMH: 1. GERD 2. Rheumatoid arthritis: Followed by rheumatology in [place].  - No known pulmonary disease from RA.  3. Osteoporosis. 4. Gout 5. CKD stage 4 6. H/o SVT 7. Chronic systolic CHF: She was found to have nonischemic cardiomyopathy with low EF in 2015.  - LHC (2015): Normal coronaries.  - cardiac MRI (2015): EF 37%, no LGE - Echo (3/21): EF 20-25%. - Echo (11/21): EF 20-25%, moderate LV dilation, global hypokinesis, biatrial enlargement, mildly decreased RV systolic function.  - Echo (12/21): EF < 20%, moderate LV dilation, normal RV size/systolic function, moderate MR, IVC normal.  - RHC (1/22): Mean RA 1, PA 39/14, mean PCWP 10, PVR 3.9 WU, CI 2.22.  - CPX (3/22): peak VO2 10.1, VE/VCO2 55, RER 1.13.  Severe functional limitation due to HF.  - TEE (4/22): EF < 20%, moderate LV dilation, mild RV dilation with moderate RV dysfunction, moderate central MR. - PYP scan (4/22): grade 1 with H/CL 1.48. Equivocal. [employer] gene testing negative. - Cardiac MRI (10/22): mild LV dilation with EF 23%, normal RV size with RVEF 37%, ECV 34%, small area of subendocardial LGE in the mid inferolateral wall and small area of full thickness LGE in the basal inferior wall.  - PYP scan (3/23): grade 1, H/CL 1.3.  Probably negative.  - Echo 07/11/22: EF 45-50% RV normal  -  Echo (3/24): EF 20-25%, mild LV dilation, severely decreased RV systolic function, mild RV enlargement, severe biatrial enlargement, moderate-severe TR, moderate MR, IVC normal.  8. Atrial fibrillation/flutter: TEE-DCCV in 4/22.  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  10. Varicella zoster with post-herpetic neuralgia 11. L hip fracture , mechanical fall.  12. Anemia  SH: From [place] but recently moved to [place] to live with daughter.  Has 3 children.  Nonsmoker.  No ETOH.   FH: Mother with CHF, father with MI.   ROS: All systems reviewed and negative except as per HPI.   Current Outpatient Medications  Medication Sig Dispense Refill   amiodarone (PACERONE) 200 MG tablet Take 0.5 tablets (100 mg total) by mouth daily. 60 tablet 6   bumetanide (BUMEX) 1 MG tablet Take 1 tablet (1 mg total) by mouth daily.     calcitRIOL (ROCALTROL) 0.25 MCG capsule Take 0.25 mcg by mouth daily.  ELIQUIS 2.5 MG TABS tablet TAKE 1 TABLET BY MOUTH TWICE A DAY 60 tablet 5   Ensure (ENSURE) Take 237 mLs by mouth daily as needed (for meal supplement).     folic acid (FOLVITE) 1 MG tablet TAKE 1 TABLET BY MOUTH EVERY DAY 30 tablet 11   hydroxypropyl methylcellulose / hypromellose (ISOPTO TEARS / GONIOVISC) 2.5 % ophthalmic solution Place 1 drop into both eyes as needed for dry eyes.     Multiple Vitamin (MULTIVITAMIN) tablet Take 1 tablet by mouth daily.     omeprazole (PRILOSEC) 40 MG capsule Take 40 mg by mouth daily.     polyethylene glycol (MIRALAX / GLYCOLAX) 17 g packet Take 17 g by mouth daily as needed for mild constipation.     predniSONE (DELTASONE) 5 MG tablet TAKE 1 TABLET BY MOUTH EVERY DAY WITH BREAKFAST 90 tablet 0   No current facility-administered medications for this encounter.   Wt Readings from Last 3 Encounters:  03/25/23 46.3 kg (102 lb)  02/09/23 45.2 kg (99 lb 9.6 oz)  02/03/23 44.6 kg (98 lb 6.4 oz)   BP 102/60   Pulse 89   Wt 46.3 kg (102 lb)    SpO2 99%   BMI 17.51 kg/m   PHYSICAL EXAM: General: NAD, frail Neck: No JVD, no thyromegaly or thyroid nodule.  Lungs: Clear to auscultation bilaterally with normal respiratory effort. CV: Nondisplaced PMI.  Heart regular S1/S2, no S3/S4, 1/6 HSM LLSB/apex.  No peripheral edema.  No carotid bruit.  Normal pedal pulses.  Abdomen: Soft, nontender, no hepatosplenomegaly, no distention.  Skin: Intact without lesions or rashes.  Neurologic: Alert and oriented x 3.  Psych: Normal affect. Extremities: No clubbing or cyanosis.  HEENT: Normal.   Assessment/Plan: 1. Chronic Systolic CHF: Nonischemic cardiomyopathy.  This has been known since 2015, cath in 2015 showed no coronary disease and cardiac MRI in 2015 showed no LGE.  Cause is uncertain, familial cardiomyopathy is a concern given nonischemic cardiomyopathy in her mother.  Cannot rule out remote viral myocarditis.Poor appetite and severe HF limitation on CPX in 3/22 is concerning, as is elevated creatinine (suspect cardiorenal syndrome).  Low BP and elevated creatinine have limited her cardiac med regimen. RHC (1/22) showed low filling pressures and actually a relatively preserved cardiac index of 2.22.  PYP scan in 4/22 was equivocal, PYP scan repeated in 3/23 and likely negative.  Cardiac MRI in 10/22 showed mild LV dilation with EF 23%, normal RV size with RVEF 37%, ECV 34%, small area of subendocardial LGE in the mid inferolateral wall and small area of full thickness LGE in the basal inferior wall.   This was not suggestive of cardiac amyloidosis but possibly could be consistent with coronary embolism (though no LV thrombus visualized) versus prior myocarditis versus sarcoidosis. With renal failure, CHF, and atrial arrhythmias, we have assessed for cardiac amyloidosis.  PYP scan was equivocal in 4/22 for transthyretin cardiac amyloidosis.  It was repeated in 3/23 and likely negative.  [employer] gene testing for TTR mutations was negative. The  cardiac MRI was not consistent with amyloidosis.  She has MGUS with monoclonal IgG paraprotein, but based on cMRI and slow progression, think AL amyloidosis is unlikely.  Most recent echo 3/24 with EF 20-25%, mild LV dilation, severely decreased RV systolic function, mild RV enlargement, severe biatrial enlargement, moderate-severe TR, moderate MR, IVC normal. She was admitted in 1/24 and required milrinone. Chronically NYHA class IIIb symptoms and CKD stage IV.  Suspect she is end-stage.  Not a candidate for advanced therapies. Though she fatigues very easily, REDS clip and exam do not suggest volume overload. - Continue Bumex 1 mg daily.  BMET/BNP today.  - Off Verquvo with hypotension.  - Off dapagliflozin with rise in SCr, with low BP would not restart.   - Off spironolactone with elevated SCr and soft BP.  - Does not have an ICD, would not place with end stage HF.   2. CKD: Stage 4.  She is followed by [name].  - BMET 3. Rheumatoid arthritis: No history of lung involvement. She has been on a low dose of prednisone chronically. No off Imuran, felt to be contributing to anemia  - She follows with Dr. Dimple Casey  4. Atrial fibrillation/flutter: S/p TEE-DCCV in 4/22.  She is now on amiodarone and in NSR.  - Continue amiodarone 100 mg daily. Will need LFTs/TSH next visit.  Will need regular eye exam.  - Continue Eliquis 5 mg bid.  5. Anemia: She is receiving iron infusions per nephrology.  Has been referred to palliative care, not ready for hospice.   Follow up in 6 wks with APP.   Marca Ancona,  03/25/2023

## 2023-03-25 NOTE — Progress Notes (Signed)
ReDS Vest / Clip - 03/25/23 1100       ReDS Vest / Clip   Station Marker A    Ruler Value 26    ReDS Value Range Low volume    ReDS Actual Value 28

## 2023-03-25 NOTE — Patient Instructions (Signed)
There has been no changes to your medications  Labs done today, your results will be available in MyChart, we will contact you for abnormal readings.  Your physician recommends that you schedule a follow-up appointment in: 6 weeks  If you have any questions or concerns before your next appointment please send [place] a message through [place] or call our office at [phone].    TO LEAVE A MESSAGE FOR THE NURSE SELECT OPTION 2, PLEASE LEAVE A MESSAGE INCLUDING: YOUR NAME DATE OF BIRTH CALL BACK NUMBER REASON FOR CALL**this is important as we prioritize the call backs  YOU WILL RECEIVE A CALL BACK THE SAME DAY AS LONG AS YOU CALL BEFORE 4:00 PM  At the [facility], you and your health needs are our priority. As part of our continuing mission to provide you with exceptional heart care, we have created designated Provider Care Teams. These Care Teams include your primary Cardiologist (physician) and Advanced Practice Providers (APPs- Physician Assistants and Nurse Practitioners) who all work together to provide you with the care you need, when you need it.   You may see any of the following providers on your designated Care Team at your next follow up: Dr Glori Bickers Dr Loralie Champagne Dr. Roxana Hires, NP Lyda Jester, [place] [facility], [place] Forestine Na, NP Audry Riles, PharmD   Please be sure to bring in all your medications bottles to every appointment.    Thank you for choosing [facility]

## 2023-03-29 ENCOUNTER — Other Ambulatory Visit: Payer: Self-pay | Admitting: Internal Medicine

## 2023-03-29 DIAGNOSIS — H16003 Unspecified corneal ulcer, bilateral: Secondary | ICD-10-CM

## 2023-03-29 DIAGNOSIS — M0579 Rheumatoid arthritis with rheumatoid factor of multiple sites without organ or systems involvement: Secondary | ICD-10-CM

## 2023-03-30 ENCOUNTER — Encounter: Payer: Medicare PPO | Admitting: Internal Medicine

## 2023-03-30 ENCOUNTER — Encounter: Payer: Self-pay | Admitting: Internal Medicine

## 2023-03-30 VITALS — BP 106/75 | HR 91 | Resp 14 | Ht 64.0 in | Wt 96.0 lb

## 2023-03-30 DIAGNOSIS — H16003 Unspecified corneal ulcer, bilateral: Secondary | ICD-10-CM | POA: Diagnosis not present

## 2023-03-30 DIAGNOSIS — M0579 Rheumatoid arthritis with rheumatoid factor of multiple sites without organ or systems involvement: Secondary | ICD-10-CM | POA: Diagnosis not present

## 2023-03-30 DIAGNOSIS — Z7952 Long term (current) use of systemic steroids: Secondary | ICD-10-CM | POA: Diagnosis not present

## 2023-03-30 DIAGNOSIS — N184 Chronic kidney disease, stage 4 (severe): Secondary | ICD-10-CM | POA: Diagnosis not present

## 2023-03-30 MED ORDER — GABAPENTIN 100 MG PO CAPS
100.0000 mg | ORAL_CAPSULE | Freq: Every day | ORAL | 5 refills | Status: DC
Start: 2023-03-30 — End: 2023-05-28

## 2023-03-30 MED ORDER — PREDNISONE 5 MG PO TABS
5.0000 mg | ORAL_TABLET | Freq: Every day | ORAL | 1 refills | Status: DC
Start: 2023-03-30 — End: 2023-12-02

## 2023-04-11 ENCOUNTER — Other Ambulatory Visit: Payer: Self-pay | Admitting: Nurse Practitioner

## 2023-04-14 ENCOUNTER — Encounter (HOSPITAL_COMMUNITY)
Admission: RE | Admit: 2023-04-14 | Discharge: 2023-04-14 | Disposition: A | Discharge status: Home / Self Care | Payer: Medicare PPO | Source: Ambulatory Visit | Attending: Nephrology | Admitting: Nephrology

## 2023-04-14 VITALS — BP 100/62 | HR 80 | Temp 97.2°F | Resp 17

## 2023-04-14 DIAGNOSIS — N183 Chronic kidney disease, stage 3 unspecified: Secondary | ICD-10-CM | POA: Insufficient documentation

## 2023-04-14 LAB — IRON AND TIBC
Iron: 43 ug/dL (ref 28–170)
Saturation Ratios: 16 % (ref 10.4–31.8)
TIBC: 276 ug/dL (ref 250–450)
UIBC: 233 ug/dL

## 2023-04-14 LAB — FERRITIN: Ferritin: 292 ng/mL (ref 11–307)

## 2023-04-14 LAB — POCT HEMOGLOBIN-HEMACUE: Hemoglobin: 11 g/dL — ABNORMAL LOW (ref 12.0–15.0)

## 2023-04-14 MED ORDER — EPOETIN ALFA-EPBX 10000 UNIT/ML IJ SOLN
10000.0000 [IU] | INTRAMUSCULAR | Status: DC
  Administered 2023-04-14: 10000 [IU] via SUBCUTANEOUS

## 2023-04-14 MED ORDER — EPOETIN ALFA-EPBX 10000 UNIT/ML IJ SOLN
INTRAMUSCULAR | Status: AC
  Filled 2023-04-14: qty 1

## 2023-04-17 ENCOUNTER — Other Ambulatory Visit (HOSPITAL_COMMUNITY): Payer: Self-pay | Admitting: Cardiology

## 2023-04-22 DIAGNOSIS — S72002D Fracture of unspecified part of neck of left femur, subsequent encounter for closed fracture with routine healing: Secondary | ICD-10-CM | POA: Diagnosis not present

## 2023-04-22 DIAGNOSIS — M6281 Muscle weakness (generalized): Secondary | ICD-10-CM | POA: Diagnosis not present

## 2023-04-22 DIAGNOSIS — N184 Chronic kidney disease, stage 4 (severe): Secondary | ICD-10-CM | POA: Diagnosis not present

## 2023-04-22 DIAGNOSIS — I5022 Chronic systolic (congestive) heart failure: Secondary | ICD-10-CM | POA: Diagnosis not present

## 2023-04-22 DIAGNOSIS — R278 Other lack of coordination: Secondary | ICD-10-CM | POA: Diagnosis not present

## 2023-05-04 ENCOUNTER — Other Ambulatory Visit: Payer: Self-pay | Admitting: Internal Medicine

## 2023-05-04 DIAGNOSIS — M0579 Rheumatoid arthritis with rheumatoid factor of multiple sites without organ or systems involvement: Secondary | ICD-10-CM

## 2023-05-04 DIAGNOSIS — H16003 Unspecified corneal ulcer, bilateral: Secondary | ICD-10-CM

## 2023-05-06 ENCOUNTER — Other Ambulatory Visit (HOSPITAL_COMMUNITY): Payer: Self-pay | Admitting: Cardiology

## 2023-05-06 MED ORDER — AMIODARONE HCL 200 MG PO TABS: 100.0000 mg | ORAL_TABLET | Freq: Every day | ORAL | 3 refills | Status: DC

## 2023-05-06 NOTE — Telephone Encounter (Signed)
AMIO REFILL RETURNED TO PHARMACY AS REQUESTED

## 2023-05-07 ENCOUNTER — Encounter (HOSPITAL_COMMUNITY): Payer: Medicare PPO

## 2023-05-12 ENCOUNTER — Encounter (HOSPITAL_COMMUNITY): Payer: Medicare PPO

## 2023-05-12 NOTE — Progress Notes (Deleted)
I,Paul Torpey T Deloria Lair, CMA,acting as a Neurosurgeon for [name], [name].,have documented all relevant documentation on the behalf of [name], [name],as directed by  [name], [name] while in the presence of [name], [name].  Subjective:  [name] , female    DOB: 09-14-1945 , 77 y.o.   MRN: [id number]  No chief complaint on file.   HPI  HPI   Past Medical History:  Diagnosis Date  . Acid reflux 07/15/2020  . Arthritis 07/15/2020  . CHF (congestive heart failure) (HCC)   . Colitis 07/15/2020  . Dilated cardiomyopathy (HCC) 07/15/2020  . HFrEF (heart failure with reduced ejection fraction) (HCC) 07/15/2020  . HTN (hypertension) 07/15/2020  . Osteoporosis 07/15/2020  . Palpitations 07/15/2020  . Rheumatoid arthritis (HCC) 07/15/2020     Family History  Problem Relation Age of Onset  . Hypertension Mother   . Diabetes Mother   . Hypertension Father   . Diabetes Father   . Breast cancer Maternal Aunt   . Breast cancer Paternal Aunt   . Arthritis Maternal Grandmother   . Lung disease Paternal Grandfather   . Cancer Brother      Current Outpatient Medications:  .  amiodarone (PACERONE) 200 MG tablet, Take 0.5 tablets (100 mg total) by mouth daily., Disp: 45 tablet, Rfl: 3 .  bumetanide (BUMEX) 1 MG tablet, Take 1 tablet (1 mg total) by mouth daily., Disp: , Rfl:  .  calcitRIOL (ROCALTROL) 0.25 MCG capsule, Take 0.25 mcg by mouth daily., Disp: , Rfl:  .  ELIQUIS 2.5 MG TABS tablet, TAKE 1 TABLET BY MOUTH TWICE A DAY, Disp: 60 tablet, Rfl: 5 .  Ensure (ENSURE), Take 237 mLs by mouth daily as needed (for meal supplement)., Disp: , Rfl:  .  folic acid (FOLVITE) 1 MG tablet, TAKE 1 TABLET BY MOUTH EVERY DAY, Disp: 30 tablet, Rfl: 11 .  gabapentin (NEURONTIN) 100 MG capsule, Take 1 capsule (100 mg total) by mouth at bedtime., Disp: 30 capsule, Rfl: 5 .  hydroxypropyl methylcellulose / hypromellose (ISOPTO TEARS / GONIOVISC) 2.5 % ophthalmic solution, Place 1 drop into  both eyes as needed for dry eyes., Disp: , Rfl:  .  Multiple Vitamin (MULTIVITAMIN) tablet, Take 1 tablet by mouth daily., Disp: , Rfl:  .  omeprazole (PRILOSEC) 40 MG capsule, TAKE 1 CAPSULE (40 MG TOTAL) BY MOUTH DAILY AS NEEDED (FOR ACID REFLUX)., Disp: 90 capsule, Rfl: 1 .  polyethylene glycol (MIRALAX / GLYCOLAX) 17 g packet, Take 17 g by mouth daily as needed for mild constipation., Disp: , Rfl:  .  predniSONE (DELTASONE) 5 MG tablet, Take 1 tablet (5 mg total) by mouth daily with breakfast., Disp: 90 tablet, Rfl: 1 .  sodium bicarbonate 650 MG tablet, Take 650 mg by mouth 2 (two) times daily., Disp: , Rfl:    Allergies  Allergen Reactions  . Baclofen Nausea And Vomiting  . Penicillin G Other (See Comments)    Unknown  . Penicillins Rash     Review of Systems  Constitutional: Negative.   Respiratory: Negative.    Cardiovascular: Negative.   Neurological: Negative.   Psychiatric/Behavioral: Negative.       There were no vitals filed for this visit. There is no height or weight on file to calculate BMI.  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  Patient was given opportunity to ask questions. Patient verbalized understanding of the plan and was able to repeat key elements of the plan. All questions were answered to their satisfaction.  [name], [name]  I, [name], [name], have reviewed all documentation for this visit. The documentation on 05/12/23 for the exam, diagnosis, procedures, and orders are all accurate and complete.   IF YOU HAVE BEEN REFERRED TO A SPECIALIST, IT MAY TAKE 1-2 WEEKS TO SCHEDULE/PROCESS THE REFERRAL. IF YOU HAVE  NOT HEARD FROM US/SPECIALIST IN TWO WEEKS, PLEASE GIVE [place] A CALL AT [phone].   THE PATIENT IS ENCOURAGED TO PRACTICE SOCIAL DISTANCING DUE TO THE COVID-19 PANDEMIC.

## 2023-05-13 ENCOUNTER — Ambulatory Visit: Payer: Medicare PPO | Admitting: Nurse Practitioner

## 2023-05-13 DIAGNOSIS — D6869 Other thrombophilia: Secondary | ICD-10-CM

## 2023-05-13 DIAGNOSIS — I5020 Unspecified systolic (congestive) heart failure: Secondary | ICD-10-CM

## 2023-05-13 DIAGNOSIS — I130 Hypertensive heart and chronic kidney disease with heart failure and stage 1 through stage 4 chronic kidney disease, or unspecified chronic kidney disease: Secondary | ICD-10-CM

## 2023-05-13 DIAGNOSIS — R7303 Prediabetes: Secondary | ICD-10-CM

## 2023-05-20 DIAGNOSIS — N2581 Secondary hyperparathyroidism of renal origin: Secondary | ICD-10-CM | POA: Diagnosis not present

## 2023-05-20 DIAGNOSIS — I428 Other cardiomyopathies: Secondary | ICD-10-CM | POA: Diagnosis not present

## 2023-05-20 DIAGNOSIS — N184 Chronic kidney disease, stage 4 (severe): Secondary | ICD-10-CM | POA: Diagnosis not present

## 2023-05-20 DIAGNOSIS — M1000 Idiopathic gout, unspecified site: Secondary | ICD-10-CM | POA: Diagnosis not present

## 2023-05-20 DIAGNOSIS — M069 Rheumatoid arthritis, unspecified: Secondary | ICD-10-CM | POA: Diagnosis not present

## 2023-05-20 DIAGNOSIS — D631 Anemia in chronic kidney disease: Secondary | ICD-10-CM | POA: Diagnosis not present

## 2023-05-20 DIAGNOSIS — M810 Age-related osteoporosis without current pathological fracture: Secondary | ICD-10-CM | POA: Diagnosis not present

## 2023-05-21 LAB — LAB REPORT - SCANNED: EGFR: 19

## 2023-05-22 DIAGNOSIS — M6281 Muscle weakness (generalized): Secondary | ICD-10-CM | POA: Diagnosis not present

## 2023-05-22 DIAGNOSIS — N184 Chronic kidney disease, stage 4 (severe): Secondary | ICD-10-CM | POA: Diagnosis not present

## 2023-05-22 DIAGNOSIS — I5022 Chronic systolic (congestive) heart failure: Secondary | ICD-10-CM | POA: Diagnosis not present

## 2023-05-22 DIAGNOSIS — S72002D Fracture of unspecified part of neck of left femur, subsequent encounter for closed fracture with routine healing: Secondary | ICD-10-CM | POA: Diagnosis not present

## 2023-05-22 DIAGNOSIS — R278 Other lack of coordination: Secondary | ICD-10-CM | POA: Diagnosis not present

## 2023-05-24 ENCOUNTER — Encounter (HOSPITAL_COMMUNITY): Payer: Medicare PPO

## 2023-05-25 ENCOUNTER — Inpatient Hospital Stay (HOSPITAL_COMMUNITY)
Admission: RE | Admit: 2023-05-25 | Discharge: 2023-05-25 | Discharge status: Home / Self Care | Payer: Medicare PPO | Source: Ambulatory Visit | Attending: Nephrology

## 2023-05-25 VITALS — BP 101/74 | HR 50 | Temp 97.4°F | Resp 16

## 2023-05-25 DIAGNOSIS — N183 Chronic kidney disease, stage 3 unspecified: Secondary | ICD-10-CM | POA: Insufficient documentation

## 2023-05-25 LAB — FERRITIN: Ferritin: 324 ng/mL — ABNORMAL HIGH (ref 11–307)

## 2023-05-25 LAB — IRON AND TIBC
Iron: 64 ug/dL (ref 28–170)
Saturation Ratios: 18 % (ref 10.4–31.8)
TIBC: 361 ug/dL (ref 250–450)
UIBC: 297 ug/dL

## 2023-05-25 LAB — POCT HEMOGLOBIN-HEMACUE: Hemoglobin: 10.6 g/dL — ABNORMAL LOW (ref 12.0–15.0)

## 2023-05-25 MED ORDER — EPOETIN ALFA-EPBX 10000 UNIT/ML IJ SOLN
INTRAMUSCULAR | Status: AC
  Administered 2023-05-25: 10000 [IU] via SUBCUTANEOUS
  Filled 2023-05-25: qty 1

## 2023-05-25 MED ORDER — EPOETIN ALFA-EPBX 10000 UNIT/ML IJ SOLN: 10000.0000 [IU] | INTRAMUSCULAR | Status: DC

## 2023-05-26 ENCOUNTER — Telehealth: Payer: Self-pay | Admitting: Nurse Practitioner

## 2023-05-26 NOTE — Telephone Encounter (Signed)
   Over the past two days, pt has noted increased pedal edema and 4 lbs wt gain (104 lbs, typically 100 lbs on home scale).  She has also noted increasing dyspnea and fatigue.  She's took an extra bumex 1mg  yesterday and questions if she should repeat this today.  She has known ckd III - IV.  Creat 2.45 on August.  I rec that she may take an additional 1mg  of bumex now and I will forward this note to the CHF clinic, as she will likely need to be seen before the end of the week w/ labs.  Caller verbalized understanding and was grateful for the call back.  Nicolasa Ducking, NP 05/26/2023, 5:47 PM

## 2023-05-27 ENCOUNTER — Other Ambulatory Visit (HOSPITAL_COMMUNITY): Payer: Self-pay

## 2023-05-27 ENCOUNTER — Encounter (HOSPITAL_COMMUNITY): Payer: Self-pay | Admitting: Cardiology

## 2023-05-27 ENCOUNTER — Ambulatory Visit (HOSPITAL_COMMUNITY)
Admission: RE | Admit: 2023-05-27 | Discharge: 2023-05-27 | Disposition: A | Discharge status: Home / Self Care | Payer: Medicare PPO | Source: Ambulatory Visit | Attending: Cardiology | Admitting: Cardiology

## 2023-05-27 VITALS — BP 100/64 | HR 102 | Wt 104.0 lb

## 2023-05-27 DIAGNOSIS — D649 Anemia, unspecified: Secondary | ICD-10-CM | POA: Diagnosis not present

## 2023-05-27 DIAGNOSIS — Z79899 Other long term (current) drug therapy: Secondary | ICD-10-CM | POA: Insufficient documentation

## 2023-05-27 DIAGNOSIS — I428 Other cardiomyopathies: Secondary | ICD-10-CM | POA: Insufficient documentation

## 2023-05-27 DIAGNOSIS — Z8249 Family history of ischemic heart disease and other diseases of the circulatory system: Secondary | ICD-10-CM | POA: Diagnosis not present

## 2023-05-27 DIAGNOSIS — I43 Cardiomyopathy in diseases classified elsewhere: Secondary | ICD-10-CM | POA: Insufficient documentation

## 2023-05-27 DIAGNOSIS — Z7901 Long term (current) use of anticoagulants: Secondary | ICD-10-CM | POA: Diagnosis not present

## 2023-05-27 DIAGNOSIS — I959 Hypotension, unspecified: Secondary | ICD-10-CM | POA: Insufficient documentation

## 2023-05-27 DIAGNOSIS — I4891 Unspecified atrial fibrillation: Secondary | ICD-10-CM | POA: Insufficient documentation

## 2023-05-27 DIAGNOSIS — N184 Chronic kidney disease, stage 4 (severe): Secondary | ICD-10-CM | POA: Insufficient documentation

## 2023-05-27 DIAGNOSIS — Z7952 Long term (current) use of systemic steroids: Secondary | ICD-10-CM | POA: Diagnosis not present

## 2023-05-27 DIAGNOSIS — I480 Paroxysmal atrial fibrillation: Secondary | ICD-10-CM

## 2023-05-27 DIAGNOSIS — I5022 Chronic systolic (congestive) heart failure: Secondary | ICD-10-CM | POA: Insufficient documentation

## 2023-05-27 DIAGNOSIS — M069 Rheumatoid arthritis, unspecified: Secondary | ICD-10-CM | POA: Insufficient documentation

## 2023-05-27 DIAGNOSIS — I484 Atypical atrial flutter: Secondary | ICD-10-CM | POA: Insufficient documentation

## 2023-05-27 LAB — COMPREHENSIVE METABOLIC PANEL
ALT: 48 U/L — ABNORMAL HIGH (ref 0–44)
AST: 25 U/L (ref 15–41)
Albumin: 3.3 g/dL — ABNORMAL LOW (ref 3.5–5.0)
Alkaline Phosphatase: 61 U/L (ref 38–126)
Anion gap: 10 (ref 5–15)
BUN: 85 mg/dL — ABNORMAL HIGH (ref 8–23)
CO2: 17 mmol/L — ABNORMAL LOW (ref 22–32)
Calcium: 9.1 mg/dL (ref 8.9–10.3)
Chloride: 109 mmol/L (ref 98–111)
Creatinine, Ser: 3.28 mg/dL — ABNORMAL HIGH (ref 0.44–1.00)
GFR, Estimated: 14 mL/min — ABNORMAL LOW (ref >60)
Glucose, Bld: 132 mg/dL — ABNORMAL HIGH (ref 70–99)
Potassium: 4.2 mmol/L (ref 3.5–5.1)
Sodium: 136 mmol/L (ref 135–145)
Total Bilirubin: 0.5 mg/dL (ref 0.3–1.2)
Total Protein: 6.3 g/dL — ABNORMAL LOW (ref 6.5–8.1)

## 2023-05-27 LAB — CBC
HCT: 31.5 % — ABNORMAL LOW (ref 36.0–46.0)
Hemoglobin: 9.5 g/dL — ABNORMAL LOW (ref 12.0–15.0)
MCH: 27.5 pg (ref 26.0–34.0)
MCHC: 30.2 g/dL (ref 30.0–36.0)
MCV: 91.3 fL (ref 80.0–100.0)
Platelets: 189 10*3/uL (ref 150–400)
RBC: 3.45 MIL/uL — ABNORMAL LOW (ref 3.87–5.11)
RDW: 17.3 % — ABNORMAL HIGH (ref 11.5–15.5)
WBC: 8.2 10*3/uL (ref 4.0–10.5)
nRBC: 0.4 % — ABNORMAL HIGH (ref 0.0–0.2)

## 2023-05-27 LAB — TSH: TSH: 2.149 u[IU]/mL (ref 0.350–4.500)

## 2023-05-27 MED ORDER — BUMETANIDE 1 MG PO TABS: 1.5000 mg | ORAL_TABLET | Freq: Every day | ORAL | 3 refills | Status: DC

## 2023-05-27 MED ORDER — AMIODARONE HCL 200 MG PO TABS: ORAL_TABLET | ORAL | 3 refills | Status: DC

## 2023-05-27 NOTE — Patient Instructions (Signed)
INCREASE Bumex to 1.5 mg (1 1/2 Tab) daily.  INCREASE Amiodarone to 200 mg Twice daily for 7 days, then 200 mg daily.  Labs done today, your results will be available in MyChart, we will contact you for abnormal readings.  You are scheduled for a TEE (Transesophageal Echocardiogram) Guided Cardioversion on Wednesday, October 23 with [name].  Please arrive at the [facility] (Main Entrance A) at [facility]: [address] [place], [place] [address] at 11:30 AM (This time is 1.5 hour(s) before your procedure to ensure your preparation). Free valet parking service is available. You will check in at ADMITTING. The support person will be asked to wait in the waiting room.  It is OK to have someone drop you off and come back when you are ready to be discharged.      DIET:  Nothing to eat or drink after midnight except a sip of water with medications (see medication instructions below)  MEDICATION INSTRUCTIONS:  HOLD YOUR BUMEX THE MORNING OF YOUR PROCEDURE.       Continue taking your anticoagulant (blood thinner): Apixaban (Eliquis).  You will need to continue this after your procedure until you are told by your provider that it is safe to stop.    FYI:  For your safety, and to allow [place] to monitor your vital signs accurately during the surgery/procedure we request: If you have artificial nails, gel coating, SNS etc, please have those removed prior to your surgery/procedure. Not having the nail coverings /polish removed may result in cancellation or delay of your surgery/procedure.  You must have a responsible person to drive you home and stay in the waiting area during your procedure. Failure to do so could result in cancellation.  Bring your insurance cards.  *Special Note: Every effort is made to have your procedure done on time. Occasionally there are emergencies that occur at the hospital that may cause delays. Please be patient if a delay does occur.    Your physician  recommends that you schedule a follow-up appointment in: 3 WEEKS  If you have any questions or concerns before your next appointment please send [place] a message through [place] or call our office at [phone].    TO LEAVE A MESSAGE FOR THE NURSE SELECT OPTION 2, PLEASE LEAVE A MESSAGE INCLUDING: YOUR NAME DATE OF BIRTH CALL BACK NUMBER REASON FOR CALL**this is important as we prioritize the call backs  YOU WILL RECEIVE A CALL BACK THE SAME DAY AS LONG AS YOU CALL BEFORE 4:00 PM  At the [facility], you and your health needs are our priority. As part of our continuing mission to provide you with exceptional heart care, we have created designated Provider Care Teams. These Care Teams include your primary Cardiologist (physician) and Advanced Practice Providers (APPs- Physician Assistants and Nurse Practitioners) who all work together to provide you with the care you need, when you need it.   You may see any of the following providers on your designated Care Team at your next follow up: Dr Arvilla Meres Dr Marca Ancona Dr. Dorthula Nettles Dr. Clearnce Hasten Amy Filbert Schilder, NP Robbie Lis, [place] [facility] [place], [place] Brynda Peon, NP [place] Lee, NP Karle Plumber, PharmD   Please be sure to bring in all your medications bottles to every appointment.    Thank you for choosing [facility]

## 2023-05-27 NOTE — Progress Notes (Signed)
ReDS Vest / Clip - 05/27/23 1100       ReDS Vest / Clip   Station Marker A    Ruler Value 28    ReDS Value Range Low volume    ReDS Actual Value 19

## 2023-05-27 NOTE — Progress Notes (Signed)
Advanced Heart Failure Clinic Progress Note   PCP: Tracy Felts, [name] Nephrology: Tracy Nelson HF Cardiologist: Tracy Nelson  77 y.o. with history of rheumatoid arthritis, CKD stage 3, and nonischemic cardiomyopathy was referred by Tracy Nelson in [place] to establish heart failure care in [place].  Patient has been known to have a cardiomyopathy since 2015.  Cardiac MRI in 2015 showed EF 37% with no LGE.  Coronary angiography at that time showed no significant coronary disease.  Over the next few years, LV EF fluctuated up and down.  In 3/21, echo showed EF down to 20-25%.  She was admitted to the hospital in [place], [place] in 11/21 with CHF exacerbation.  Echo showed EF 20-25%, global hypokinesis, mildly decreased RV systolic function.  Creatinine was noted to be elevated. She was diuresed and sent home, but was readmitted later in 11/21 with ongoing volume overload. Creatinine was up to 2.89.  She was taken off most of her cardiac meds due to soft BP and elevated creatinine. She was discharged to her daughter's house in [place].   Repeated an echo on her in 12/21, EF < 20% with moderate LV dilation and mildly decreased RV systolic function. RHC was done, surprisingly showing low filling pressures and relatively preserved cardiac index at 2.22.   She was admitted in 3/22 with atrial fibrillation, converted to NSR on amiodarone. She went into atrial flutter after this and had TEE-DCCV in 4/22.  TEE showed EF < 20%, moderate LV dilation, mild RV dilation with moderate RV dysfunction, moderate central MR.  CPX in 3/22 showed severe HF limitation.  PYP scan 4/22 was grade 1 with H/CL 1.48, equivocal.  PYP scan repeated in 3/23 and likely negative.   She was evaluated for [facility] or ANTHEM, BNP too high and thought to be too frail.   Cardiac MRI in 10/22 showed mild LV dilation with EF 23%, normal RV size with RVEF 37%, ECV 34%, small area of subendocardial LGE in the mid inferolateral wall and  small area of full thickness LGE in the basal  inferior wall.   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    Admitted 07/08/22 after mechanical fall resulting in left hip fracture. S/P left intramedullary nail 07/10/22.  Post op developed AKI. Given IV fluids . Diuretics adjusted. Echo showed EF improved to 45%. Discharged to [facility].   Admitted in 1/24 with acute on chronic systolic CHF and AKI. She required milrinone, RHC showed CI 2.81 on milrinone 0.25.  Echo showed EF < 20%.  Suspected end stage HF.   Echo 3/24 showed EF 20-25%, mild LV dilation, severely decreased RV systolic function, mild RV enlargement, severe biatrial enlargement, moderate-severe TR, moderate MR, IVC normal.   She was admitted 3/13-3/24/24 w/ symptomatic anemia and dyspepsia. Hgb 6.8,  transfused 2 uRBCs. GI recommended conservative management. There was no overt blood loss-FOBT was negative-gastroenterology did not feel endoscopic evaluation was warranted. She was started on Protonix w/ improvement of symptoms and relieve of  dyspepsia. Hgb remained stable post transfusion. Hospital course was c/b by volume overload. AHF team consulted and she was diuresed w/ IV Lasix, then transitioned back to Bumex.   GDMT limited by CKD and hypotension. Verquvo stopped.   She returns for followup of CHF.  She is quite frail.  She was seen as a work-in today due to increased dyspnea for several days as well as weight gain.  She called in and was told to  take an extra 0.5 mg bumetanide yesterday.  She says that this helped.  She has noted increased abdominal swelling as well as peripheral edema, though this cleared up after taking extra dose of bumetanide yesterday. She is short of breath walking around the house.  She has chronic orthopnea.  No chest pain or lightheadedness.  No BRBPR/melena.  She does report missing a few doses of Tracy Nelson recently. She is in atypical atrial flutter today but  does not feel palpitations.   ECG (personally reviewed): Atypical atrial flutter rate in 120s, PVCs   ReDs clip: 19%  Labs (12/21): K 5 => 3.7, creatinine 2.89 => 1.9 => 1.69, BNP 2904 Labs (1/22): K 4, creatinine 1.9, hgb 12.5 Labs (3/22): K 3.6, creatinine 2.36, hgb 11.6, TSH normal Labs (4/22): K 3.7, creatinine 2.25, LFTs normal, urine immunofixation normal, pro-BNP 6498 Labs (7/22): Myeloma panel with IgG monoclonal protein Labs (10/22): K 4.1, creatinine 1.96 Labs (12/22): BNP 549, TSH normal, K 4.3, creatinine 1.78 Labs (1/23): SCr 2.61, K 4.0, Hgb 11.4  Lab (3/23): Scr 2.29, K 3.9  Labs (6/23): K 4.1, creatinine 3.82, normal LFTs, normal TSH, hgb 9.4 Labs (07/15/22): K 4.4 Creatinine 2.78  Labs (2/24): K 4.1, creatinine 2.76, hgb 8.3 Labs (6/24): SCR 3.44, Hgb A1c 5, tSat 29%, ferritin 566 Labs (7/24): K 3.6, creatinine 2.93, BNP 996 Labs (8/24): hgb 11.2, BNP 2122, K 3.8, creatinine 2.45 Labs (10/24): 10.6  PMH: 1. GERD 2. Rheumatoid arthritis: Followed by rheumatology in [place].  - No known pulmonary disease from RA.  3. Osteoporosis. 4. Gout 5. CKD stage 4 6. H/o SVT 7. Chronic systolic CHF: She was found to have nonischemic cardiomyopathy with low EF in 2015.  - LHC (2015): Normal coronaries.  - cardiac MRI (2015): EF 37%, no LGE - Echo (3/21): EF 20-25%. - Echo (11/21): EF 20-25%, moderate LV dilation, global hypokinesis, biatrial enlargement, mildly decreased RV systolic function.  - Echo (12/21): EF < 20%, moderate LV dilation, normal RV size/systolic function, moderate MR, IVC normal.  - RHC (1/22): Mean RA 1, PA 39/14, mean PCWP 10, PVR 3.9 WU, CI 2.22.  - CPX (3/22): peak VO2 10.1, VE/VCO2 55, RER 1.13.  Severe functional limitation due to HF.  - TEE (4/22): EF < 20%, moderate LV dilation, mild RV dilation with moderate RV dysfunction, moderate central MR. - PYP scan (4/22): grade 1 with H/CL 1.48. Equivocal. [employer] gene testing negative. - Cardiac MRI  (10/22): mild LV dilation with EF 23%, normal RV size with RVEF 37%, ECV 34%, small area of subendocardial LGE in the mid inferolateral wall and small area of full thickness LGE in the basal inferior wall.  - PYP scan (3/23): grade 1, H/CL 1.3.  Probably negative.  - Echo 07/11/22: EF 45-50% RV normal  - Echo (3/24): EF 20-25%, mild LV dilation, severely decreased RV systolic function, mild RV enlargement, severe biatrial enlargement, moderate-severe TR, moderate MR, IVC normal.  8. Atrial fibrillation/flutter: TEE-DCCV in 4/22.  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  10. Varicella zoster with post-herpetic neuralgia 11. L hip fracture , mechanical fall.  12. Anemia  SH: From [place] but recently moved to [place] to live with daughter.  Has 3 children.  Nonsmoker.  No ETOH.   FH: Mother with CHF, father with MI.   ROS: All systems reviewed and negative except as per HPI.   Current Outpatient Medications  Medication Sig Dispense Refill   calcitRIOL (ROCALTROL) 0.25 MCG capsule Take  0.5 mcg by mouth daily.     Tracy Nelson 2.5 MG TABS tablet TAKE 1 TABLET BY MOUTH TWICE A DAY 60 tablet 5   Ensure (ENSURE) Take 237 mLs by mouth daily as needed (for meal supplement).     folic acid (FOLVITE) 1 MG tablet TAKE 1 TABLET BY MOUTH EVERY DAY 30 tablet 11   gabapentin (NEURONTIN) 100 MG capsule Take 1 capsule (100 mg total) by mouth at bedtime. 30 capsule 5   hydroxypropyl methylcellulose / hypromellose (ISOPTO TEARS / GONIOVISC) 2.5 % ophthalmic solution Place 1 drop into both eyes as needed for dry eyes.     Multiple Vitamin (MULTIVITAMIN) tablet Take 1 tablet by mouth daily.     omeprazole (PRILOSEC) 40 MG capsule TAKE 1 CAPSULE (40 MG TOTAL) BY MOUTH DAILY AS NEEDED (FOR ACID REFLUX). 90 capsule 1   polyethylene glycol (MIRALAX / GLYCOLAX) 17 g packet Take 17 g by mouth daily as needed for mild constipation.     predniSONE (DELTASONE) 5 MG tablet Take 1 tablet (5 mg total)  by mouth daily with breakfast. 90 tablet 1   sodium bicarbonate 650 MG tablet Take 650 mg by mouth 2 (two) times daily.     amiodarone (PACERONE) 200 MG tablet Take 1 tablet (200 mg total) by mouth 2 (two) times daily for 7 days, THEN 1 tablet (200 mg total) daily. 90 tablet 3   bumetanide (BUMEX) 1 MG tablet Take 1.5 tablets (1.5 mg total) by mouth daily. 45 tablet 3   No current facility-administered medications for this encounter.   Wt Readings from Last 3 Encounters:  05/27/23 47.2 kg (104 lb)  03/30/23 43.5 kg (96 lb)  03/25/23 46.3 kg (102 lb)   BP 100/64   Pulse (!) 102   Wt 47.2 kg (104 lb)   SpO2 99%   BMI 17.85 kg/m   PHYSICAL EXAM: General: NAD, frail Neck: JVP 8 cm, no thyromegaly or thyroid nodule.  Lungs: Clear to auscultation bilaterally with normal respiratory effort. CV: Nondisplaced PMI.  Heart regular S1/S2, no S3/S4, no murmur.  No peripheral edema.  No carotid bruit.  Normal pedal pulses.  Abdomen: Soft, nontender, no hepatosplenomegaly, mild distention.  Skin: Intact without lesions or rashes.  Neurologic: Alert and oriented x 3.  Psych: Normal affect. Extremities: No clubbing or cyanosis.  HEENT: Normal.   Assessment/Plan: 1. Chronic Systolic CHF: Nonischemic cardiomyopathy.  This has been known since 2015, cath in 2015 showed no coronary disease and cardiac MRI in 2015 showed no LGE.  Cause is uncertain, familial cardiomyopathy is a concern given nonischemic cardiomyopathy in her mother.  Cannot rule out remote viral myocarditis.Poor appetite and severe HF limitation on CPX in 3/22 is concerning, as is elevated creatinine (suspect cardiorenal syndrome).  Low BP and elevated creatinine have limited her cardiac med regimen. RHC (1/22) showed low filling pressures and actually a relatively preserved cardiac index of 2.22.  PYP scan in 4/22 was equivocal, PYP scan repeated in 3/23 and likely negative.  Cardiac MRI in 10/22 showed mild LV dilation with EF 23%,  normal RV size with RVEF 37%, ECV 34%, small area of subendocardial LGE in the mid inferolateral wall and small area of full thickness LGE in the basal inferior wall.   This was not suggestive of cardiac amyloidosis but possibly could be consistent with coronary embolism (though no LV thrombus visualized) versus prior myocarditis versus sarcoidosis. With renal failure, CHF, and atrial arrhythmias, we have assessed for cardiac amyloidosis.  PYP  scan was equivocal in 4/22 for transthyretin cardiac amyloidosis.  It was repeated in 3/23 and likely negative.  [employer] gene testing for TTR mutations was negative. The cardiac MRI was not consistent with amyloidosis.  She has MGUS with monoclonal IgG paraprotein, but based on cMRI and slow progression, think AL amyloidosis is unlikely.  Most recent echo 3/24 with EF 20-25%, mild LV dilation, severely decreased RV systolic function, mild RV enlargement, severe biatrial enlargement, moderate-severe TR, moderate MR, IVC normal. She was admitted in 1/24 and required milrinone. Chronically NYHA class IIIb symptoms and CKD stage IV.  Suspect she is end-stage.  Not a candidate for advanced therapies. Symptoms worse for the last few days, suspect due to the onset of atypical atrial flutter with RVR.  She took an extra Bumex 0.5 mg yesterday and actually does not appears significantly volume overloaded on exam today and REDS clip is not elevated.  - Needs conversion back to NSR (see below).  - Continue Bumex 1 mg daily.  BMET/BNP today.  - Off Verquvo with hypotension.  - Off dapagliflozin with rise in SCr, with low BP would not restart.   - Off spironolactone with elevated SCr and soft BP.  - Does not have an ICD, would not place with end stage HF.   2. CKD: Stage 4.  She is followed by Tracy Nelson.  - BMET today.  3. Rheumatoid arthritis: No history of lung involvement. She has been on a low dose of prednisone chronically. No off Imuran, felt to be contributing to anemia   - She follows with Dr. Dimple Casey  4. Atrial fibrillation/flutter: S/p TEE-DCCV in 4/22.  She is now on amiodarone.  She is in atypical atrial flutter today.  I uuspect that based on symptoms, this has been present for several days.  - Increase amiodarone to 200 mg bid x 1 week then 200 mg daily.  Check LFTs and TSH today.   - Continue Tracy Nelson 5 mg bid.  - If she does not convert to NSR on her own, she will need TEE-guided DCCV (Has missed occasional pm Tracy Nelson doses).   I will set this up as soon as we can due to worsened symptoms and risk for worsening decompensation/hospitalization.  I do not think that she will tolerate this rhythm long-term.  We discussed risks/benefits and she agrees to procedure. I think that this is  5. Anemia: She is receiving iron infusions per nephrology. - CBC today.   Has been referred to palliative care, not ready for hospice.   Followup in 3 wks with APP.   Tracy Nelson,  05/27/2023

## 2023-05-27 NOTE — Telephone Encounter (Signed)
Add on 10/17 @ 1120

## 2023-05-28 ENCOUNTER — Telehealth (HOSPITAL_COMMUNITY): Payer: Self-pay

## 2023-05-28 MED ORDER — BUMETANIDE 1 MG PO TABS: 1.0000 mg | ORAL_TABLET | Freq: Every day | ORAL | Status: DC

## 2023-05-28 NOTE — Telephone Encounter (Signed)
-----   Message from Marca Ancona sent at 05/27/2023  4:03 PM EDT ----- Creatinine is up.  Would NOT increase Bumex to 1.5 mg daily, keep at 1 mg daily.  Needs cardioversion.

## 2023-05-28 NOTE — Telephone Encounter (Signed)
Pt aware, agreeable, and verbalized understanding 

## 2023-05-31 ENCOUNTER — Telehealth (HOSPITAL_COMMUNITY): Payer: Self-pay | Admitting: *Deleted

## 2023-06-01 ENCOUNTER — Telehealth: Payer: Self-pay | Admitting: Student-PharmD

## 2023-06-01 NOTE — Progress Notes (Signed)
Patient previously enrolled in patient assistance programs. On Eliquis and may qualify for PAP. Forwarding.  Sharee Pimple

## 2023-06-01 NOTE — OR Nursing (Signed)
Called patient with pre-procedure instructions for tomorrow.   Patient informed of:   Time to arrive for procedure. 1115 Remain NPO past midnight.  Must have a ride home and a responsible adult to remain with them for 24 ours post procedure.  Confirmed blood thinner. Confirmed no breaks in taking blood thinner for 3+ weeks prior to procedure. Confirmed patient stopped all GLP-1s and GLP-2s for at least one week before procedure.   Spoke with patient regarding above information.

## 2023-06-02 ENCOUNTER — Ambulatory Visit (HOSPITAL_COMMUNITY): Payer: Medicare PPO

## 2023-06-02 ENCOUNTER — Ambulatory Visit (HOSPITAL_COMMUNITY)
Admission: RE | Admit: 2023-06-02 | Discharge: 2023-06-02 | Disposition: A | Discharge status: Home / Self Care | Payer: Medicare PPO | Source: Ambulatory Visit | Attending: Cardiology | Admitting: Cardiology

## 2023-06-02 ENCOUNTER — Other Ambulatory Visit: Payer: Self-pay

## 2023-06-02 ENCOUNTER — Ambulatory Visit (HOSPITAL_COMMUNITY)
Admission: RE | Admit: 2023-06-02 | Discharge: 2023-06-02 | Disposition: A | Discharge status: Home / Self Care | Payer: Medicare PPO | Attending: Cardiology | Admitting: Cardiology

## 2023-06-02 ENCOUNTER — Encounter (HOSPITAL_COMMUNITY)
Admission: RE | Disposition: A | Discharge status: Home / Self Care | Payer: Self-pay | Source: Home / Self Care | Attending: Cardiology

## 2023-06-02 DIAGNOSIS — I480 Paroxysmal atrial fibrillation: Secondary | ICD-10-CM | POA: Diagnosis not present

## 2023-06-02 DIAGNOSIS — I4891 Unspecified atrial fibrillation: Secondary | ICD-10-CM

## 2023-06-02 DIAGNOSIS — N183 Chronic kidney disease, stage 3 unspecified: Secondary | ICD-10-CM | POA: Diagnosis not present

## 2023-06-02 DIAGNOSIS — I5043 Acute on chronic combined systolic (congestive) and diastolic (congestive) heart failure: Secondary | ICD-10-CM | POA: Diagnosis not present

## 2023-06-02 DIAGNOSIS — I130 Hypertensive heart and chronic kidney disease with heart failure and stage 1 through stage 4 chronic kidney disease, or unspecified chronic kidney disease: Secondary | ICD-10-CM | POA: Diagnosis not present

## 2023-06-02 DIAGNOSIS — I110 Hypertensive heart disease with heart failure: Secondary | ICD-10-CM | POA: Insufficient documentation

## 2023-06-02 DIAGNOSIS — Z539 Procedure and treatment not carried out, unspecified reason: Secondary | ICD-10-CM | POA: Diagnosis not present

## 2023-06-02 DIAGNOSIS — I509 Heart failure, unspecified: Secondary | ICD-10-CM | POA: Diagnosis not present

## 2023-06-02 SURGERY — INVASIVE LAB ABORTED CASE
Anesthesia: Monitor Anesthesia Care

## 2023-06-02 MED ORDER — PHENYLEPHRINE HCL-NACL 20-0.9 MG/250ML-% IV SOLN
INTRAVENOUS | Status: DC | PRN
  Administered 2023-06-02: 50 ug/min via INTRAVENOUS

## 2023-06-02 MED ORDER — SODIUM CHLORIDE 0.9 % IV SOLN: INTRAVENOUS | Status: DC

## 2023-06-02 SURGICAL SUPPLY — 1 items: PAD DEFIB RADIO PHYSIO CONN (PAD) ×1 IMPLANT

## 2023-06-02 NOTE — Transfer of Care (Signed)
Immediate Anesthesia Transfer of Care Note  Patient: Tracy Nelson  Procedure(s) Performed: TRANSESOPHAGEAL ECHOCARDIOGRAM CARDIOVERSION (CATH LAB)  Patient Location: Cath Lab  Anesthesia Type: case cancelled, no anesthetic administered  Level of Consciousness: awake and alert   Airway & Oxygen Therapy: Patient Spontanous Breathing  Post-op Assessment:  case cancelled n/a  Post vital signs: Reviewed and stable  Last Vitals:  Vitals Value Taken Time  BP    Temp    Pulse    Resp    SpO2      Last Pain:  Vitals:   06/02/23 1141  TempSrc: Temporal         Complications: No notable events documented.

## 2023-06-02 NOTE — Anesthesia Preprocedure Evaluation (Signed)
Anesthesia Evaluation  Patient identified by MRN, date of birth, ID band Patient awake    Reviewed: Allergy & Precautions, NPO status , Patient's Chart, lab work & pertinent test results, reviewed documented beta blocker date and time   History of Anesthesia Complications Negative for: history of anesthetic complications  Airway Mallampati: IV   Neck ROM: Limited  Mouth opening: Limited Mouth Opening  Dental  (+) Upper Dentures   Pulmonary neg COPD   breath sounds clear to auscultation       Cardiovascular hypertension, +CHF  + dysrhythmias + Valvular Problems/Murmurs  Rhythm:Irregular Rate:Normal  1. Left ventricular ejection fraction, by estimation, is 20 to 25%. The  left ventricle has severely decreased function. The left ventricle  demonstrates global hypokinesis. The left ventricular internal cavity size  was mildly dilated. Left ventricular  diastolic function could not be evaluated. Elevated left ventricular  end-diastolic pressure.   2. Right ventricular systolic function is severely reduced. The right  ventricular size is mildly enlarged. There is normal pulmonary artery  systolic pressure.   3. Left atrial size was severely dilated.   4. Right atrial size was severely dilated.   5. The mitral valve is degenerative. Moderate mitral valve regurgitation.  No evidence of mitral stenosis.   6. Tricuspid valve regurgitation is moderate to severe.   7. The aortic valve is calcified. Aortic valve regurgitation is mild to  moderate. Aortic valve sclerosis/calcification is present, without any  evidence of aortic stenosis.     Neuro/Psych neg Seizures    GI/Hepatic ,GERD  ,,(+) neg Cirrhosis        Endo/Other    Renal/GU ESRFRenal disease     Musculoskeletal  (+) Arthritis ,    Abdominal   Peds  Hematology  (+) Blood dyscrasia, anemia   Anesthesia Other Findings   Reproductive/Obstetrics                               Anesthesia Physical Anesthesia Plan  ASA: 4  Anesthesia Plan: MAC   Post-op Pain Management:    Induction:   PONV Risk Score and Plan:   Airway Management Planned:   Additional Equipment:   Intra-op Plan:   Post-operative Plan:   Informed Consent: I have reviewed the patients History and Physical, chart, labs and discussed the procedure including the risks, benefits and alternatives for the proposed anesthesia with the patient or authorized representative who has indicated his/her understanding and acceptance.       Plan Discussed with: CRNA  Anesthesia Plan Comments:          Anesthesia Quick Evaluation

## 2023-06-02 NOTE — Anesthesia Postprocedure Evaluation (Signed)
Anesthesia Post Note  Patient: Tracy Nelson  Procedure(s) Performed: TRANSESOPHAGEAL ECHOCARDIOGRAM     Patient location during evaluation: PACU Anesthesia Type: MAC Level of consciousness: awake and alert Pain management: pain level controlled Vital Signs Assessment: post-procedure vital signs reviewed and stable Respiratory status: spontaneous breathing, nonlabored ventilation, respiratory function stable and patient connected to nasal cannula oxygen Cardiovascular status: stable and blood pressure returned to baseline Postop Assessment: no apparent nausea or vomiting Anesthetic complications: no   No notable events documented.  Last Vitals:  Vitals:   06/02/23 1141  BP: (!) 106/91  Pulse: 78  Resp: 17  Temp: 37 C  SpO2: 98%    Last Pain:  Vitals:   06/02/23 1141  TempSrc: Temporal                 Mariann Barter

## 2023-06-03 ENCOUNTER — Other Ambulatory Visit (HOSPITAL_COMMUNITY): Payer: Self-pay | Admitting: Cardiology

## 2023-06-03 ENCOUNTER — Ambulatory Visit: Payer: Medicare PPO | Admitting: Nurse Practitioner

## 2023-06-03 ENCOUNTER — Other Ambulatory Visit: Payer: Self-pay

## 2023-06-03 ENCOUNTER — Ambulatory Visit: Payer: Medicare PPO

## 2023-06-03 DIAGNOSIS — Z0000 Encounter for general adult medical examination without abnormal findings: Secondary | ICD-10-CM

## 2023-06-03 NOTE — Progress Notes (Signed)
Subjective:   Tracy Nelson is a 77 y.o. female who presents for Medicare Annual (Subsequent) preventive examination.  Visit Complete: Virtual I connected with  Tracy Nelson on 06/03/23 by a audio enabled telemedicine application and verified that I am speaking with the correct person using two identifiers.  Patient Location: Home  Provider Location: Office/Clinic  I discussed the limitations of evaluation and management by telemedicine. The patient expressed understanding and agreed to proceed.  Vital Signs: Because this visit was a virtual/telehealth visit, some criteria may be missing or patient reported. Any vitals not documented were not able to be obtained and vitals that have been documented are patient reported.    Cardiac Risk Factors include: advanced age (>52men, >3 women);hypertension     Objective:    Today's Vitals   There is no height or weight on file to calculate BMI.     06/03/2023    3:46 PM 06/02/2023   11:57 AM 11/25/2022    3:11 PM 10/29/2022    7:39 PM 08/31/2022    6:14 PM 08/31/2022    4:18 AM 07/09/2022    1:31 PM  Advanced Directives  Does Patient Have a Medical Advance Directive? Yes No No No  No   Type of Media planner of Healthcare Power of Attorney in Chart? No - copy requested        Would patient like information on creating a medical advance directive?   No - Patient declined No - Patient declined No - Patient declined  Yes (Inpatient - patient requests chaplain consult to create a medical advance directive)    Current Medications (verified) Outpatient Encounter Medications as of 06/03/2023  Medication Sig   acetaminophen (TYLENOL) 500 MG tablet Take 500 mg by mouth every 6 (six) hours as needed for moderate pain (pain score 4-6).   amiodarone (PACERONE) 200 MG tablet Take 1 tablet (200 mg total) by mouth 2 (two) times daily for 7 days, THEN 1 tablet (200 mg total) daily. (Patient  taking differently: Take 200 mg by mouth once daily)   bumetanide (BUMEX) 1 MG tablet Take 1 tablet (1 mg total) by mouth daily.   calcitRIOL (ROCALTROL) 0.25 MCG capsule Take 0.5 mcg by mouth daily.   ELIQUIS 2.5 MG TABS tablet TAKE 1 TABLET BY MOUTH TWICE A DAY   Ensure (ENSURE) Take 237 mLs by mouth every other day.   folic acid (FOLVITE) 1 MG tablet TAKE 1 TABLET BY MOUTH EVERY DAY   hydroxypropyl methylcellulose / hypromellose (ISOPTO TEARS / GONIOVISC) 2.5 % ophthalmic solution Place 1 drop into both eyes as needed for dry eyes.   methocarbamol (ROBAXIN) 500 MG tablet Take 500 mg by mouth 2 (two) times daily as needed for muscle spasms.   Multiple Vitamin (MULTIVITAMIN) tablet Take 1 tablet by mouth 2 (two) times a week.   omeprazole (PRILOSEC) 40 MG capsule TAKE 1 CAPSULE (40 MG TOTAL) BY MOUTH DAILY AS NEEDED (FOR ACID REFLUX).   predniSONE (DELTASONE) 5 MG tablet Take 1 tablet (5 mg total) by mouth daily with breakfast.   sodium bicarbonate 650 MG tablet Take 650 mg by mouth 2 (two) times daily.   No facility-administered encounter medications on file as of 06/03/2023.    Allergies (verified) Baclofen and Penicillins   History: Past Medical History:  Diagnosis Date   Acid reflux 07/15/2020   Arthritis 07/15/2020   CHF (congestive heart failure) (HCC)  Colitis 07/15/2020   Dilated cardiomyopathy (HCC) 07/15/2020   HFrEF (heart failure with reduced ejection fraction) (HCC) 07/15/2020   HTN (hypertension) 07/15/2020   Osteoporosis 07/15/2020   Palpitations 07/15/2020   Rheumatoid arthritis (HCC) 07/15/2020   Past Surgical History:  Procedure Laterality Date   CARDIOVERSION N/A 11/08/2020   Procedure: CARDIOVERSION;  Surgeon: Laurey Morale, MD;  Location: [facility] ENDOSCOPY;  Service: Cardiovascular;  Laterality: N/A;   HIP FRACTURE SURGERY     INTRAMEDULLARY (IM) NAIL INTERTROCHANTERIC Left 07/10/2022   Procedure: INTRAMEDULLARY (IM) NAIL INTERTROCHANTERIC;  Surgeon: Tarry Kos,  MD;  Location: [facility] OR;  Service: Orthopedics;  Laterality: Left;   RIGHT HEART CATH N/A 08/14/2020   Procedure: RIGHT HEART CATH;  Surgeon: Laurey Morale, MD;  Location: [facility] INVASIVE CV LAB;  Service: Cardiovascular;  Laterality: N/A;   RIGHT HEART CATH N/A 09/03/2022   Procedure: RIGHT HEART CATH;  Surgeon: Laurey Morale, MD;  Location: [facility] INVASIVE CV LAB;  Service: Cardiovascular;  Laterality: N/A;   TEE WITHOUT CARDIOVERSION N/A 11/08/2020   Procedure: TRANSESOPHAGEAL ECHOCARDIOGRAM (TEE);  Surgeon: Laurey Morale, MD;  Location: [facility] ENDOSCOPY;  Service: Cardiovascular;  Laterality: N/A;   Family History  Problem Relation Age of Onset   Hypertension Mother    Diabetes Mother    Hypertension Father    Diabetes Father    Breast cancer Maternal Aunt    Breast cancer Paternal Aunt    Arthritis Maternal Grandmother    Lung disease Paternal Grandfather    Cancer Brother    Social History   Socioeconomic History   Marital status: Widowed    Spouse name: Not on file   Number of children: Not on file   Years of education: 16   Highest education level: Bachelor's degree (e.g., BA, AB, BS)  Occupational History   Occupation: Retired  Tobacco Use   Smoking status: Never    Passive exposure: Past   Smokeless tobacco: Never  Vaping Use   Vaping status: Never Used  Substance and Sexual Activity   Alcohol use: Never   Drug use: Never   Sexual activity: Not Currently  Other Topics Concern   Not on file  Social History Narrative   Not on file   Social Determinants of Health   Financial Resource Strain: Low Risk  (06/03/2023)   Overall Financial Resource Strain (CARDIA)    Difficulty of Paying Living Expenses: Not hard at all  Food Insecurity: No Food Insecurity (06/03/2023)   Hunger Vital Sign    Worried About Running Out of Food in the Last Year: Never true    Ran Out of Food in the Last Year: Never true  Transportation Needs: No Transportation Needs (06/03/2023)    PRAPARE - Administrator, Civil Service (Medical): No    Lack of Transportation (Non-Medical): No  Physical Activity: Inactive (06/03/2023)   Exercise Vital Sign    Days of Exercise per Week: 0 days    Minutes of Exercise per Session: 0 min  Stress: No Stress Concern Present (06/03/2023)   [employer] of Occupational Health - Occupational Stress Questionnaire    Feeling of Stress : Only a little  Social Connections: Moderately Isolated (06/03/2023)   Social Connection and Isolation Panel [NHANES]    Frequency of Communication with Friends and Family: More than three times a week    Frequency of Social Gatherings with Friends and Family: Never    Attends Religious Services: More than 4 times per year  Active Member of Clubs or Organizations: No    Attends Banker Meetings: Never    Marital Status: Widowed    Tobacco Counseling Counseling given: Not Answered   Clinical Intake:  Pre-visit preparation completed: Yes  Pain : No/denies pain     Nutritional Risks: None Diabetes: No  How often do you need to have someone help you when you read instructions, pamphlets, or other written materials from your doctor or pharmacy?: 1 - Never  Interpreter Needed?: No  Information entered by :: NAllen LPN   Activities of Daily Living    06/03/2023    3:39 PM 06/02/2023   11:56 AM  In your present state of health, do you have any difficulty performing the following activities:  Hearing? 0 0  Vision? 0 0  Difficulty concentrating or making decisions? 0 0  Walking or climbing stairs? 1   Dressing or bathing? 1   Doing errands, shopping? 1   Comment has someone with her or someone does it for her   Preparing Food and eating ? N   Using the Toilet? N   In the past six months, have you accidently leaked urine? Y   Comment with sneeze or cough sometimes   Do you have problems with loss of bowel control? N   Managing your Medications? N   Managing  your Finances? N   Housekeeping or managing your Housekeeping? Y     Patient Care Team: [name], [name] as PCP - General (General Practice) Burna Sis, LCSW as Social Worker (Licensed Clinical Social Worker) Physiological scientist, Pershing Cox, RPH (Inactive) (Pharmacist) Laurey Morale, MD as Consulting Physician (Cardiology)  Indicate any recent Medical Services you may have received from other than [facility] providers in the past year (date may be approximate).     Assessment:   This is a routine wellness examination for Coryn.  Hearing/Vision screen Hearing Screening - Comments:: Denies hearing issues Vision Screening - Comments:: Regular eye exams,    Goals Addressed             This Visit's Progress    Patient Stated       06/03/2023, exercise more       Depression Screen    06/03/2023    3:49 PM 01/13/2023    8:57 AM 08/27/2022    3:48 PM 04/30/2022   11:43 AM 03/09/2022   11:13 AM 04/10/2021   10:15 AM 12/06/2020    8:44 AM  PHQ 2/9 Scores  PHQ - 2 Score 0 0 0 0 0 0 0  PHQ- 9 Score  9         Fall Risk    06/03/2023    3:47 PM 01/13/2023    8:56 AM 08/27/2022    3:45 PM 04/30/2022   11:42 AM 03/09/2022   11:13 AM  Fall Risk   Falls in the past year? 0 0 1 0 0  Number falls in past yr: 0 0 1 0 0  Injury with Fall? 0 0 1 0 0  Risk for fall due to : Medication side effect;Impaired mobility;Impaired balance/gait No Fall Risks History of fall(s) Impaired balance/gait;Impaired mobility;Medication side effect   Follow up Falls prevention discussed;Falls evaluation completed Falls evaluation completed Falls evaluation completed Falls prevention discussed;Education provided;Falls evaluation completed Falls evaluation completed    MEDICARE RISK AT HOME: Medicare Risk at Home Any stairs in or around the home?: Yes If so, are there any without handrails?: No Home free of loose  throw rugs in walkways, pet beds, electrical cords, etc?: Yes Adequate lighting in your home to reduce  risk of falls?: Yes Life alert?: No Use of a cane, walker or w/c?: Yes Grab bars in the bathroom?: No Shower chair or bench in shower?: Yes Elevated toilet seat or a handicapped toilet?: Yes  TIMED UP AND GO:  Was the test performed?  No    Cognitive Function:        06/03/2023    3:49 PM 04/30/2022   11:45 AM 04/10/2021   10:18 AM  6CIT Screen  What Year? 0 points 0 points 0 points  What month? 0 points 0 points 0 points  What time? 0 points 0 points 0 points  Count back from 20 0 points 0 points 0 points  Months in reverse 0 points 0 points 0 points  Repeat phrase 0 points 4 points 2 points  Total Score 0 points 4 points 2 points    Immunizations There is no immunization history for the selected administration types on file for this patient.  TDAP status: Up to date  Flu Vaccine status: Due, Education has been provided regarding the importance of this vaccine. Advised may receive this vaccine at local pharmacy or Health Dept. Aware to provide a copy of the vaccination record if obtained from local pharmacy or Health Dept. Verbalized acceptance and understanding.  Pneumococcal vaccine status: Due, Education has been provided regarding the importance of this vaccine. Advised may receive this vaccine at local pharmacy or Health Dept. Aware to provide a copy of the vaccination record if obtained from local pharmacy or Health Dept. Verbalized acceptance and understanding.  Covid-19 vaccine status: Declined, Education has been provided regarding the importance of this vaccine but patient still declined. Advised may receive this vaccine at local pharmacy or Health Dept.or vaccine clinic. Aware to provide a copy of the vaccination record if obtained from local pharmacy or Health Dept. Verbalized acceptance and understanding.  Qualifies for Shingles Vaccine? Yes   Zostavax completed No   Shingrix Completed?: No.    Education has been provided regarding the importance of this vaccine.  Patient has been advised to call insurance company to determine out of pocket expense if they have not yet received this vaccine. Advised may also receive vaccine at local pharmacy or Health Dept. Verbalized acceptance and understanding.  Screening Tests Health Maintenance  Topic Date Due   COVID-19 Vaccine (1) Never done   Zoster Vaccines- Shingrix (1 of 2) Never done   INFLUENZA VACCINE  Never done   Pneumonia Vaccine 5+ Years old (1 of 1 - PCV) 01/13/2024 (Originally 08/10/2010)   DEXA SCAN  Completed   Hepatitis C Screening  Completed   HPV VACCINES  Aged Out   DTaP/Tdap/Td  Discontinued    Health Maintenance  Health Maintenance Due  Topic Date Due   COVID-19 Vaccine (1) Never done   Zoster Vaccines- Shingrix (1 of 2) Never done   INFLUENZA VACCINE  Never done    Colorectal cancer screening: No longer required.   Mammogram status: patient to schedule  Bone Density status: Completed 10/22/2021.   Lung Cancer Screening: (Low Dose CT Chest recommended if Age 45-80 years, 20 pack-year currently smoking OR have quit w/in 15years.) does not qualify.   Lung Cancer Screening Referral: no  Additional Screening:  Hepatitis C Screening: does qualify; Completed 02/13/2021  Vision Screening: Recommended annual ophthalmology exams for early detection of glaucoma and other disorders of the eye. Is the patient up  to date with their annual eye exam?  No  Who is the provider or what is the name of the office in which the patient attends annual eye exams? Can't remember name If pt is not established with a provider, would they like to be referred to a provider to establish care? No .   Dental Screening: Recommended annual dental exams for proper oral hygiene  Diabetic Foot Exam: n/a  Community Resource Referral / Chronic Care Management: CRR required this visit?  No   CCM required this visit?  No     Plan:     I have personally reviewed and noted the following in the patient's  chart:   Medical and social history Use of alcohol, tobacco or illicit drugs  Current medications and supplements including opioid prescriptions. Patient is not currently taking opioid prescriptions. Functional ability and status Nutritional status Physical activity Advanced directives List of other physicians Hospitalizations, surgeries, and ER visits in previous 12 months Vitals Screenings to include cognitive, depression, and falls Referrals and appointments  In addition, I have reviewed and discussed with patient certain preventive protocols, quality metrics, and best practice recommendations. A written personalized care plan for preventive services as well as general preventive health recommendations were provided to patient.     Barb Merino, LPN   16/05/9603   After Visit Summary: (Pick Up) Due to this being a telephonic visit, with patients personalized plan was offered to patient and patient has requested to Pick up at office.  Nurse Notes: none

## 2023-06-03 NOTE — Patient Instructions (Signed)
Tracy Nelson , Thank you for taking time to come for your Medicare Wellness Visit. I appreciate your ongoing commitment to your health goals. Please review the following plan we discussed and let me know if I can assist you in the future.   Referrals/Orders/Follow-Ups/Clinician Recommendations: none  This is a list of the screening recommended for you and due dates:  Health Maintenance  Topic Date Due   COVID-19 Vaccine (1) Never done   Zoster (Shingles) Vaccine (1 of 2) Never done   Flu Shot  Never done   Pneumonia Vaccine (1 of 1 - PCV) 01/13/2024*   DEXA scan (bone density measurement)  Completed   Hepatitis C Screening  Completed   HPV Vaccine  Aged Out   DTaP/Tdap/Td vaccine  Discontinued  *Topic was postponed. The date shown is not the original due date.    Advanced directives: (Copy Requested) Please bring a copy of your health care power of attorney and living will to the office to be added to your chart at your convenience.  Next Medicare Annual Wellness Visit scheduled for next year: No, office will schedule appointment  Insert Preventive Care attachment Insert FALL PREVENTION attachment if needed

## 2023-06-04 ENCOUNTER — Other Ambulatory Visit: Payer: Self-pay

## 2023-06-04 ENCOUNTER — Telehealth: Payer: Self-pay

## 2023-06-04 NOTE — Progress Notes (Unsigned)
06/04/2023  Patient ID: [name], female   DOB: August 11, 1945, 77 y.o.   MRN: [id number]  S/O Patient identified as previously being enrolled in patient assistance program(s) and potentially being eligible for BMS PAP for Eliquis 2.5mg  BID  Medication Management -Patient states monthly copay for Eliquis is $45 which is expensive for her -She is also inquiring about possibility of increasing omeprazole 40mg  daily to twice daily due to refractory GERD sx -Patient does take omeprazole 40mg  daily in the morning before breakfast; I suggested making sure this is 30 minutes prior to any food/drink to maximize efficacy  A/P  Medication Management -Based on [facility], patient would qualify for Eliquis PAP through BMS -She is going to contact her pharmacy to see if she would meet 3% OOP expenditure requirement for the patient assistance program -Patient sees PCP 10/29, and I recommend addition of famotidine 20mg  daily in the evening to see if this alleviates GERD sx she is still experiencing  Follow-up:  Wednesday to check on OOP for 2024  Lenna Gilford, PharmD, DPLA

## 2023-06-04 NOTE — Telephone Encounter (Signed)
Telephone encounter was:  Successful.  06/04/2023 Name: Tracy Nelson MRN: [id number] DOB: 03/29/1946  Tracy Nelson is a 77 y.o. year old female who is a primary care patient of [name], [name] . The community resource team was consulted for assistance with Transportation Needs   Care guide performed the following interventions: Spoke with patient about [employer] transportation for [facility] Verified patient's home address to send TAMS application and brochure.  Follow Up Plan:  No further follow up planned at this time. The patient has been provided with needed resources.  Genova Kiner [facility]  [facility], [facility] Guide Direct Dial: [phone]  Website: [email or web address]

## 2023-06-08 ENCOUNTER — Encounter: Payer: Self-pay | Admitting: Nurse Practitioner

## 2023-06-08 ENCOUNTER — Ambulatory Visit: Payer: Medicare PPO | Admitting: Nurse Practitioner

## 2023-06-08 VITALS — BP 126/66 | HR 86 | Temp 98.0°F | Ht 63.0 in | Wt 101.0 lb

## 2023-06-08 DIAGNOSIS — N184 Chronic kidney disease, stage 4 (severe): Secondary | ICD-10-CM | POA: Diagnosis not present

## 2023-06-08 DIAGNOSIS — I480 Paroxysmal atrial fibrillation: Secondary | ICD-10-CM

## 2023-06-08 DIAGNOSIS — R7303 Prediabetes: Secondary | ICD-10-CM | POA: Diagnosis not present

## 2023-06-08 DIAGNOSIS — I5020 Unspecified systolic (congestive) heart failure: Secondary | ICD-10-CM

## 2023-06-08 DIAGNOSIS — Z2821 Immunization not carried out because of patient refusal: Secondary | ICD-10-CM

## 2023-06-08 DIAGNOSIS — I130 Hypertensive heart and chronic kidney disease with heart failure and stage 1 through stage 4 chronic kidney disease, or unspecified chronic kidney disease: Secondary | ICD-10-CM

## 2023-06-08 DIAGNOSIS — Z09 Encounter for follow-up examination after completed treatment for conditions other than malignant neoplasm: Secondary | ICD-10-CM | POA: Diagnosis not present

## 2023-06-08 DIAGNOSIS — I110 Hypertensive heart disease with heart failure: Secondary | ICD-10-CM

## 2023-06-08 HISTORY — DX: Immunization not carried out because of patient refusal: Z28.21

## 2023-06-08 NOTE — Patient Instructions (Signed)
Encouraged to elevate feet when she is sitting for long periods.  She

## 2023-06-08 NOTE — Progress Notes (Signed)
[name], CMA,acting as a Neurosurgeon for [name], [name].,have documented all relevant documentation on the behalf of [name], [name],as directed by  [name], [name] while in the presence of [name], [name].  Subjective:  [name] , female    DOB: [birth year] , 77 y.o.   MRN: [id number]  Chief Complaint  Patient presents with   Hospitalization Follow-up    HPI  Patient presents today for a ER follow up.  She was seen at the Cardiologist office on 06/02/2023 not feeling well and was in PAF, was increased with her amniodarone for 1 week, had missed several doses of her Eliquis when she went for her procedure she chemically converted. She was sent to the hospital at that time. She is due to seeing her cardiologist tomorrow.   She is concerned about her feet swelling and her stomach swelling, reports she has not had this before. She is not wearing compression socks during the day. She has not worn compression socks since she had the wounds on her heels. They were on day and night while in the hospital and nursing home.  Patient reports compliance with medication. Patient denies any chest pain, SOB, or headaches. Patient has no concerns today. Patient reports today she is feeling about the same.   She reports she is now having problems with her digestion- she reports she eats and immediately goes to the bathroom.  She is not having diarrhea, the bowels are normal. She will sometimes have pain with it. She had a virtual visit last week and is now taking her omeprazole 30 minutes before she eats. So far has not made a difference.  She will try the 30 minutes before omeprazole before adding another medication.   She has the medical alert necklace. Her previous caregiver passed away and now has a female caregiver.      Past Medical History:  Diagnosis Date   Acid reflux 07/15/2020   Arthritis 07/15/2020   CHF (congestive heart failure) (HCC)    Colitis 07/15/2020   Dilated  cardiomyopathy (HCC) 07/15/2020   HFrEF (heart failure with reduced ejection fraction) (HCC) 07/15/2020   HTN (hypertension) 07/15/2020   Osteoporosis 07/15/2020   Palpitations 07/15/2020   Rheumatoid arthritis (HCC) 07/15/2020     Family History  Problem Relation Age of Onset   Hypertension Mother    Diabetes Mother    Hypertension Father    Diabetes Father    Breast cancer Maternal Aunt    Breast cancer Paternal Aunt    Arthritis Maternal Grandmother    Lung disease Paternal Grandfather    Cancer Brother      Current Outpatient Medications:    acetaminophen (TYLENOL) 500 MG tablet, Take 500 mg by mouth every 6 (six) hours as needed for moderate pain (pain score 4-6)., Disp: , Rfl:    amiodarone (PACERONE) 200 MG tablet, Take 1 tablet (200 mg total) by mouth 2 (two) times daily for 7 days, THEN 1 tablet (200 mg total) daily. (Patient taking differently: Take 200 mg by mouth once daily), Disp: 90 tablet, Rfl: 3   calcitRIOL (ROCALTROL) 0.25 MCG capsule, Take 2 capsules by mouth daily., Disp: , Rfl:    ELIQUIS 2.5 MG TABS tablet, TAKE 1 TABLET BY MOUTH TWICE A DAY, Disp: 60 tablet, Rfl: 5   Ensure (ENSURE), Take 237 mLs by mouth every other day., Disp: , Rfl:    folic acid (FOLVITE) 1 MG tablet, TAKE 1 TABLET BY MOUTH EVERY  DAY, Disp: 30 tablet, Rfl: 11   hydroxypropyl methylcellulose / hypromellose (ISOPTO TEARS / GONIOVISC) 2.5 % ophthalmic solution, Place 1 drop into both eyes as needed for dry eyes., Disp: , Rfl:    methocarbamol (ROBAXIN) 500 MG tablet, Take 500 mg by mouth 2 (two) times daily as needed for muscle spasms., Disp: , Rfl:    Multiple Vitamin (MULTIVITAMIN) tablet, Take 1 tablet by mouth 2 (two) times a week., Disp: , Rfl:    omeprazole (PRILOSEC) 40 MG capsule, TAKE 1 CAPSULE (40 MG TOTAL) BY MOUTH DAILY AS NEEDED (FOR ACID REFLUX)., Disp: 90 capsule, Rfl: 1   predniSONE (DELTASONE) 5 MG tablet, Take 1 tablet (5 mg total) by mouth daily with breakfast., Disp: 90 tablet,  Rfl: 1   sodium bicarbonate 650 MG tablet, Take 650 mg by mouth 2 (two) times daily., Disp: , Rfl:    bumetanide (BUMEX) 1 MG tablet, Take 2 tablets (2 mg total) by mouth daily for 3 days, THEN 1.5 tablets (1.5 mg total) daily. (Patient taking differently: Patient takes 2 tablets by mouth twice a a day.), Disp: 135 tablet, Rfl: 3   potassium chloride SA (KLOR-CON M) 10 MEQ tablet, Take 1 tablet (10 mEq total) by mouth daily., Disp: 30 tablet, Rfl: 11   Allergies  Allergen Reactions   Baclofen Nausea And Vomiting   Penicillins Rash     Review of Systems  Constitutional: Negative.   Respiratory: Negative.    Cardiovascular:  Positive for leg swelling.  Neurological: Negative.   Psychiatric/Behavioral: Negative.       Today's Vitals   06/08/23 1030  BP: 126/66  Pulse: 86  Temp: 98 F (36.7 C)  TempSrc: Oral  Weight: 101 lb (45.8 kg)  Height: 5\' 3"  (1.6 m)  PainSc: 3    Body mass index is 17.89 kg/m.  Wt Readings from Last 3 Encounters:  07/02/23 113 lb 6.4 oz (51.4 kg)  06/28/23 103 lb (46.7 kg)  06/16/23 110 lb (49.9 kg)      Objective:  Physical Exam Vitals reviewed.  Constitutional:      General: She is not in acute distress.    Appearance: Normal appearance.  Cardiovascular:     Pulses: Normal pulses.     Heart sounds: Normal heart sounds. No murmur heard. Pulmonary:     Effort: Pulmonary effort is normal. No respiratory distress.     Breath sounds: Normal breath sounds. No wheezing.  Musculoskeletal:        General: Swelling (mild bilateral lower extremity swelling, nonpitting) present.  Skin:    General: Skin is warm and dry.  Neurological:     General: No focal deficit present.     Mental Status: She is alert and oriented to person, place, and time.     Cranial Nerves: No cranial nerve deficit.     Motor: No weakness.  Psychiatric:        Mood and Affect: Mood normal.        Behavior: Behavior normal.        Thought Content: Thought content normal.         Judgment: Judgment normal.         Assessment And Plan:  Hospital discharge follow-up Assessment & Plan: TCM Performed. A member of the clinical team spoke with the patient upon dischare. Discharge summary was reviewed in full detail during the visit. Meds reconciled and compared to discharge meds. Medication list is updated and reviewed with the patient.  Greater than 50%  face to face time was spent in counseling an coordination of care.  All questions were answered to the satisfaction of the patient.      PAF (paroxysmal atrial fibrillation) (HCC) Assessment & Plan: Cardioverted chemically, continue f/u with Cardiology.    HFrEF (heart failure with reduced ejection fraction) (HCC) Assessment & Plan: Recent hospitalization but this was stable. Encouraged to keep bilateral lower extremities elevated and wear compression socks only during the day   Influenza vaccination declined Assessment & Plan: Patient declined influenza vaccination at this time. Patient is aware that influenza vaccine prevents illness in 70% of healthy people, and reduces hospitalizations to 30-70% in elderly. This vaccine is recommended annually. Education has been provided regarding the importance of this vaccine but patient still declined. Advised may receive this vaccine at local pharmacy or Health Dept.or vaccine clinic. Aware to provide a copy of the vaccination record if obtained from local pharmacy or Health Dept.  Pt is willing to accept risk associated with refusing vaccination.    Herpes zoster vaccination declined Assessment & Plan: Declines shingrix, educated on disease process and is aware if he changes his mind to notify office    COVID-19 vaccination declined Assessment & Plan: Declines covid 19 vaccine. Discussed risk of covid 26 and if she changes her mind about the vaccine to call the office. Education has been provided regarding the importance of this vaccine but patient still  declined. Advised may receive this vaccine at local pharmacy or Health Dept.or vaccine clinic. Aware to provide a copy of the vaccination record if obtained from local pharmacy or Health Dept.  Encouraged to take multivitamin, vitamin d, vitamin c and zinc to increase immune system. Aware can call office if would like to have vaccine here at office. Verbalized acceptance and understanding.    Prediabetes Assessment & Plan: Stable, diet controlled.   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 Continue f/u with Cardiologist   Stage 4 chronic kidney disease [facility]) Assessment & Plan: Her kidney functions declined while in the hospital and discussed what CKD is and risk. She is to f/u with Nephrology. Will recheck her levels. I have sent a copy of her results to her nephrologist  Orders: -     [id number]+EGFR    Return for HM in 4 months.  Patient was given opportunity to ask questions. Patient verbalized understanding of the plan and was able to repeat key elements of the plan. All questions were answered to their satisfaction.    [name], [name], have reviewed all documentation for this visit. The documentation on 06/08/23 for the exam, diagnosis, procedures, and orders are all accurate and complete.   IF YOU HAVE BEEN REFERRED TO A SPECIALIST, IT MAY TAKE 1-2 WEEKS TO SCHEDULE/PROCESS THE REFERRAL. IF YOU HAVE NOT HEARD FROM US/SPECIALIST IN TWO WEEKS, PLEASE GIVE [place] A CALL AT [phone].

## 2023-06-09 ENCOUNTER — Encounter (HOSPITAL_COMMUNITY): Payer: Medicare PPO

## 2023-06-09 ENCOUNTER — Ambulatory Visit: Payer: Medicare PPO | Admitting: Internal Medicine

## 2023-06-09 ENCOUNTER — Other Ambulatory Visit: Payer: Medicare PPO

## 2023-06-09 LAB — CMP14+EGFR
ALT: 31 [IU]/L (ref 0–32)
AST: 15 [IU]/L (ref 0–40)
Albumin: 3.7 g/dL — ABNORMAL LOW (ref 3.8–4.8)
Alkaline Phosphatase: 61 [IU]/L (ref 44–121)
BUN/Creatinine Ratio: 23 (ref 12–28)
BUN: 79 mg/dL (ref 8–27)
Bilirubin Total: 0.4 mg/dL (ref 0.0–1.2)
CO2: 16 mmol/L — ABNORMAL LOW (ref 20–29)
Calcium: 9.1 mg/dL (ref 8.7–10.3)
Chloride: 112 mmol/L — ABNORMAL HIGH (ref 96–106)
Creatinine, Ser: 3.39 mg/dL — ABNORMAL HIGH (ref 0.57–1.00)
Globulin, Total: 2.5 g/dL (ref 1.5–4.5)
Glucose: 92 mg/dL (ref 70–99)
Potassium: 4.4 mmol/L (ref 3.5–5.2)
Sodium: 144 mmol/L (ref 134–144)
Total Protein: 6.2 g/dL (ref 6.0–8.5)
eGFR: 13 mL/min/{1.73_m2} — ABNORMAL LOW

## 2023-06-09 NOTE — Progress Notes (Signed)
06/09/2023  [name], female   DOB: 1946/02/23, 77 y.o.   MRN: [id number]  Patient outreach to follow-up regarding eligibility for Eliquis PAP through [employer].  Patient has not yet been able to obtain report from pharmacy documenting OOP medication expense for 2024  thusfar.  I recommended that she contact the pharmacy and request the the report be sent to her via mail.  I also informed her the pharmacist will need to sign the report for it to be accepted by the company.  Based on reported [facility], she will have had to have spent approximately $1000 out of pocket on her medications since 08/10/22 to qualify for Eliquis PAP.  Patient has given my direct number and will call me when she receives report from pharmacy.  Lenna Gilford, PharmD, DPLA

## 2023-06-15 NOTE — Progress Notes (Signed)
Advanced Heart Failure Clinic Progress Note   PCP: [name], [name] Nephrology: [name] HF Cardiologist: [name]  77 y.o. with history of rheumatoid arthritis, CKD stage 3, and nonischemic cardiomyopathy was referred by [name] in [place] to establish heart failure care in [place].  Patient has been known to have a cardiomyopathy since 2015.  Cardiac MRI in 2015 showed EF 37% with no LGE.  Coronary angiography at that time showed no significant coronary disease.  Over the next few years, LV EF fluctuated up and down.  In 3/21, echo showed EF down to 20-25%.  She was admitted to the hospital in [place], [place] in 11/21 with CHF exacerbation.  Echo showed EF 20-25%, global hypokinesis, mildly decreased RV systolic function.  Creatinine was noted to be elevated. She was diuresed and sent home, but was readmitted later in 11/21 with ongoing volume overload. Creatinine was up to 2.89.  She was taken off most of her cardiac meds due to soft BP and elevated creatinine. She was discharged to her daughter's house in [place].   Repeated an echo on her in 12/21, EF < 20% with moderate LV dilation and mildly decreased RV systolic function. RHC was done, surprisingly showing low filling pressures and relatively preserved cardiac index at 2.22.   She was admitted in 3/22 with atrial fibrillation, converted to NSR on amiodarone. She went into atrial flutter after this and had TEE-DCCV in 4/22.  TEE showed EF < 20%, moderate LV dilation, mild RV dilation with moderate RV dysfunction, moderate central MR.  CPX in 3/22 showed severe HF limitation.  PYP scan 4/22 was grade 1 with H/CL 1.48, equivocal.  PYP scan repeated in 3/23 and likely negative.   She was evaluated for [facility] or ANTHEM, BNP too high and thought to be too frail.   Cardiac MRI in 10/22 showed mild LV dilation with EF 23%, normal RV size with RVEF 37%, ECV 34%, small area of subendocardial LGE in the mid inferolateral wall and  small area of full thickness LGE in the basal  inferior wall.   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    Admitted 07/08/22 after mechanical fall resulting in left hip fracture. S/P left intramedullary nail 07/10/22.  Post op developed AKI. Given IV fluids . Diuretics adjusted. Echo showed EF improved to 45%. Discharged to [facility].   Admitted in 1/24 with acute on chronic systolic CHF and AKI. She required milrinone, RHC showed CI 2.81 on milrinone 0.25.  Echo showed EF < 20%.  Suspected end stage HF.   Echo 3/24 showed EF 20-25%, mild LV dilation, severely decreased RV systolic function, mild RV enlargement, severe biatrial enlargement, moderate-severe TR, moderate MR, IVC normal.   She was admitted 3/13-3/24/24 w/ symptomatic anemia and dyspepsia. Hgb 6.8,  transfused 2 uRBCs. GI recommended conservative management. There was no overt blood loss-FOBT was negative-gastroenterology did not feel endoscopic evaluation was warranted. She was started on Protonix w/ improvement of symptoms and relieve of  dyspepsia. Hgb remained stable post transfusion. Hospital course was c/b by volume overload. AHF team consulted and she was diuresed w/ IV Lasix, then transitioned back to Bumex.   GDMT limited by CKD and hypotension. Verquvo stopped.   She returns for followup of CHF.  She is quite frail.  She was seen as a work-in today due to increased dyspnea for several days as well as weight gain.  She called in and was told to  take an extra 0.5 mg bumetanide yesterday.  She says that this helped.  She has noted increased abdominal swelling as well as peripheral edema, though this cleared up after taking extra dose of bumetanide yesterday. She is short of breath walking around the house.  She has chronic orthopnea.  No chest pain or lightheadedness.  No BRBPR/melena.  She does report missing a few doses of Eliquis recently. She is in atypical atrial flutter today but  does not feel palpitations.   ECG (personally reviewed): Atypical atrial flutter rate in 120s, PVCs   ReDs clip: 19%  Labs (12/21): K 5 => 3.7, creatinine 2.89 => 1.9 => 1.69, BNP 2904 Labs (1/22): K 4, creatinine 1.9, hgb 12.5 Labs (3/22): K 3.6, creatinine 2.36, hgb 11.6, TSH normal Labs (4/22): K 3.7, creatinine 2.25, LFTs normal, urine immunofixation normal, pro-BNP 6498 Labs (7/22): Myeloma panel with IgG monoclonal protein Labs (10/22): K 4.1, creatinine 1.96 Labs (12/22): BNP 549, TSH normal, K 4.3, creatinine 1.78 Labs (1/23): SCr 2.61, K 4.0, Hgb 11.4  Lab (3/23): Scr 2.29, K 3.9  Labs (6/23): K 4.1, creatinine 3.82, normal LFTs, normal TSH, hgb 9.4 Labs (07/15/22): K 4.4 Creatinine 2.78  Labs (2/24): K 4.1, creatinine 2.76, hgb 8.3 Labs (6/24): SCR 3.44, Hgb A1c 5, tSat 29%, ferritin 566 Labs (7/24): K 3.6, creatinine 2.93, BNP 996 Labs (8/24): hgb 11.2, BNP 2122, K 3.8, creatinine 2.45 Labs (10/24): 10.6  PMH: 1. GERD 2. Rheumatoid arthritis: Followed by rheumatology in [place].  - No known pulmonary disease from RA.  3. Osteoporosis. 4. Gout 5. CKD stage 4 6. H/o SVT 7. Chronic systolic CHF: She was found to have nonischemic cardiomyopathy with low EF in 2015.  - LHC (2015): Normal coronaries.  - cardiac MRI (2015): EF 37%, no LGE - Echo (3/21): EF 20-25%. - Echo (11/21): EF 20-25%, moderate LV dilation, global hypokinesis, biatrial enlargement, mildly decreased RV systolic function.  - Echo (12/21): EF < 20%, moderate LV dilation, normal RV size/systolic function, moderate MR, IVC normal.  - RHC (1/22): Mean RA 1, PA 39/14, mean PCWP 10, PVR 3.9 WU, CI 2.22.  - CPX (3/22): peak VO2 10.1, VE/VCO2 55, RER 1.13.  Severe functional limitation due to HF.  - TEE (4/22): EF < 20%, moderate LV dilation, mild RV dilation with moderate RV dysfunction, moderate central MR. - PYP scan (4/22): grade 1 with H/CL 1.48. Equivocal. [employer] gene testing negative. - Cardiac MRI  (10/22): mild LV dilation with EF 23%, normal RV size with RVEF 37%, ECV 34%, small area of subendocardial LGE in the mid inferolateral wall and small area of full thickness LGE in the basal inferior wall.  - PYP scan (3/23): grade 1, H/CL 1.3.  Probably negative.  - Echo 07/11/22: EF 45-50% RV normal  - Echo (3/24): EF 20-25%, mild LV dilation, severely decreased RV systolic function, mild RV enlargement, severe biatrial enlargement, moderate-severe TR, moderate MR, IVC normal.  8. Atrial fibrillation/flutter: TEE-DCCV in 4/22.  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  10. Varicella zoster with post-herpetic neuralgia 11. L hip fracture , mechanical fall.  12. Anemia  SH: From [place] but recently moved to [place] to live with daughter.  Has 3 children.  Nonsmoker.  No ETOH.   FH: Mother with CHF, father with MI.   ROS: All systems reviewed and negative except as per HPI.   Current Outpatient Medications  Medication Sig Dispense Refill   acetaminophen (TYLENOL) 500 MG tablet Take  500 mg by mouth every 6 (six) hours as needed for moderate pain (pain score 4-6).     amiodarone (PACERONE) 200 MG tablet Take 1 tablet (200 mg total) by mouth 2 (two) times daily for 7 days, THEN 1 tablet (200 mg total) daily. (Patient taking differently: Take 200 mg by mouth once daily) 90 tablet 3   bumetanide (BUMEX) 1 MG tablet Take 1 tablet (1 mg total) by mouth daily.     calcitRIOL (ROCALTROL) 0.25 MCG capsule Take 0.5 mcg by mouth daily.     ELIQUIS 2.5 MG TABS tablet TAKE 1 TABLET BY MOUTH TWICE A DAY 60 tablet 5   Ensure (ENSURE) Take 237 mLs by mouth every other day.     folic acid (FOLVITE) 1 MG tablet TAKE 1 TABLET BY MOUTH EVERY DAY 30 tablet 11   hydroxypropyl methylcellulose / hypromellose (ISOPTO TEARS / GONIOVISC) 2.5 % ophthalmic solution Place 1 drop into both eyes as needed for dry eyes.     methocarbamol (ROBAXIN) 500 MG tablet Take 500 mg by mouth 2 (two) times  daily as needed for muscle spasms.     Multiple Vitamin (MULTIVITAMIN) tablet Take 1 tablet by mouth 2 (two) times a week.     omeprazole (PRILOSEC) 40 MG capsule TAKE 1 CAPSULE (40 MG TOTAL) BY MOUTH DAILY AS NEEDED (FOR ACID REFLUX). 90 capsule 1   predniSONE (DELTASONE) 5 MG tablet Take 1 tablet (5 mg total) by mouth daily with breakfast. 90 tablet 1   sodium bicarbonate 650 MG tablet Take 650 mg by mouth 2 (two) times daily.     No current facility-administered medications for this visit.   Wt Readings from Last 3 Encounters:  06/08/23 45.8 kg (101 lb)  06/02/23 45.8 kg (101 lb)  05/27/23 47.2 kg (104 lb)   There were no vitals taken for this visit.  PHYSICAL EXAM: General: NAD, frail Neck: JVP 8 cm, no thyromegaly or thyroid nodule.  Lungs: Clear to auscultation bilaterally with normal respiratory effort. CV: Nondisplaced PMI.  Heart regular S1/S2, no S3/S4, no murmur.  No peripheral edema.  No carotid bruit.  Normal pedal pulses.  Abdomen: Soft, nontender, no hepatosplenomegaly, mild distention.  Skin: Intact without lesions or rashes.  Neurologic: Alert and oriented x 3.  Psych: Normal affect. Extremities: No clubbing or cyanosis.  HEENT: Normal.   Assessment/Plan: 1. Chronic Systolic CHF: Nonischemic cardiomyopathy.  This has been known since 2015, cath in 2015 showed no coronary disease and cardiac MRI in 2015 showed no LGE.  Cause is uncertain, familial cardiomyopathy is a concern given nonischemic cardiomyopathy in her mother.  Cannot rule out remote viral myocarditis.Poor appetite and severe HF limitation on CPX in 3/22 is concerning, as is elevated creatinine (suspect cardiorenal syndrome).  Low BP and elevated creatinine have limited her cardiac med regimen. RHC (1/22) showed low filling pressures and actually a relatively preserved cardiac index of 2.22.  PYP scan in 4/22 was equivocal, PYP scan repeated in 3/23 and likely negative.  Cardiac MRI in 10/22 showed mild LV  dilation with EF 23%, normal RV size with RVEF 37%, ECV 34%, small area of subendocardial LGE in the mid inferolateral wall and small area of full thickness LGE in the basal inferior wall.   This was not suggestive of cardiac amyloidosis but possibly could be consistent with coronary embolism (though no LV thrombus visualized) versus prior myocarditis versus sarcoidosis. With renal failure, CHF, and atrial arrhythmias, we have assessed for cardiac amyloidosis.  PYP  scan was equivocal in 4/22 for transthyretin cardiac amyloidosis.  It was repeated in 3/23 and likely negative.  [employer] gene testing for TTR mutations was negative. The cardiac MRI was not consistent with amyloidosis.  She has MGUS with monoclonal IgG paraprotein, but based on cMRI and slow progression, think AL amyloidosis is unlikely.  Most recent echo 3/24 with EF 20-25%, mild LV dilation, severely decreased RV systolic function, mild RV enlargement, severe biatrial enlargement, moderate-severe TR, moderate MR, IVC normal. She was admitted in 1/24 and required milrinone. Chronically NYHA class IIIb symptoms and CKD stage IV.  Suspect she is end-stage.  Not a candidate for advanced therapies. Symptoms worse for the last few days, suspect due to the onset of atypical atrial flutter with RVR.  She took an extra Bumex 0.5 mg yesterday and actually does not appears significantly volume overloaded on exam today and REDS clip is not elevated.  - Needs conversion back to NSR (see below).  - Continue Bumex 1 mg daily.  BMET/BNP today.  - Off Verquvo with hypotension.  - Off dapagliflozin with rise in SCr, with low BP would not restart.   - Off spironolactone with elevated SCr and soft BP.  - Does not have an ICD, would not place with end stage HF.   2. CKD: Stage 4.  She is followed by [name].  - BMET today.  3. Rheumatoid arthritis: No history of lung involvement. She has been on a low dose of prednisone chronically. No off Imuran, felt to be  contributing to anemia  - She follows with Dr. Dimple Casey  4. Atrial fibrillation/flutter: S/p TEE-DCCV in 4/22.  She is now on amiodarone.  She is in atypical atrial flutter today.  I uuspect that based on symptoms, this has been present for several days.  - Increase amiodarone to 200 mg bid x 1 week then 200 mg daily.  Check LFTs and TSH today.   - Continue Eliquis 5 mg bid.  - If she does not convert to NSR on her own, she will need TEE-guided DCCV (Has missed occasional pm Eliquis doses).   I will set this up as soon as we can due to worsened symptoms and risk for worsening decompensation/hospitalization.  I do not think that she will tolerate this rhythm long-term.  We discussed risks/benefits and she agrees to procedure. I think that this is  5. Anemia: She is receiving iron infusions per nephrology. - CBC today.   Has been referred to palliative care, not ready for hospice.   Followup in 3 wks with APP.   [name] [place],  06/15/2023

## 2023-06-16 ENCOUNTER — Encounter (HOSPITAL_COMMUNITY): Payer: Self-pay

## 2023-06-16 ENCOUNTER — Ambulatory Visit (HOSPITAL_COMMUNITY)
Admission: RE | Admit: 2023-06-16 | Discharge: 2023-06-16 | Disposition: A | Discharge status: Home / Self Care | Payer: Medicare PPO | Source: Ambulatory Visit | Attending: Family Medicine | Admitting: Family Medicine

## 2023-06-16 VITALS — BP 118/78 | HR 87 | Wt 110.0 lb

## 2023-06-16 DIAGNOSIS — N184 Chronic kidney disease, stage 4 (severe): Secondary | ICD-10-CM | POA: Diagnosis not present

## 2023-06-16 DIAGNOSIS — I428 Other cardiomyopathies: Secondary | ICD-10-CM | POA: Diagnosis not present

## 2023-06-16 DIAGNOSIS — I484 Atypical atrial flutter: Secondary | ICD-10-CM | POA: Diagnosis not present

## 2023-06-16 DIAGNOSIS — R0602 Shortness of breath: Secondary | ICD-10-CM | POA: Diagnosis not present

## 2023-06-16 DIAGNOSIS — M069 Rheumatoid arthritis, unspecified: Secondary | ICD-10-CM | POA: Diagnosis not present

## 2023-06-16 DIAGNOSIS — I5022 Chronic systolic (congestive) heart failure: Secondary | ICD-10-CM | POA: Insufficient documentation

## 2023-06-16 DIAGNOSIS — Z7901 Long term (current) use of anticoagulants: Secondary | ICD-10-CM | POA: Insufficient documentation

## 2023-06-16 DIAGNOSIS — I43 Cardiomyopathy in diseases classified elsewhere: Secondary | ICD-10-CM | POA: Diagnosis not present

## 2023-06-16 DIAGNOSIS — Z8249 Family history of ischemic heart disease and other diseases of the circulatory system: Secondary | ICD-10-CM | POA: Insufficient documentation

## 2023-06-16 DIAGNOSIS — I480 Paroxysmal atrial fibrillation: Secondary | ICD-10-CM | POA: Diagnosis not present

## 2023-06-16 DIAGNOSIS — I4891 Unspecified atrial fibrillation: Secondary | ICD-10-CM | POA: Insufficient documentation

## 2023-06-16 DIAGNOSIS — R140 Abdominal distension (gaseous): Secondary | ICD-10-CM | POA: Insufficient documentation

## 2023-06-16 DIAGNOSIS — Z7189 Other specified counseling: Secondary | ICD-10-CM

## 2023-06-16 DIAGNOSIS — D472 Monoclonal gammopathy: Secondary | ICD-10-CM | POA: Insufficient documentation

## 2023-06-16 DIAGNOSIS — D649 Anemia, unspecified: Secondary | ICD-10-CM | POA: Diagnosis not present

## 2023-06-16 DIAGNOSIS — Z79899 Other long term (current) drug therapy: Secondary | ICD-10-CM | POA: Diagnosis not present

## 2023-06-16 MED ORDER — BUMETANIDE 1 MG PO TABS: ORAL_TABLET | ORAL | 3 refills | Status: DC

## 2023-06-16 NOTE — Patient Instructions (Signed)
Medication Changes:  INCREASE BUMEX TO 1.5MG  DAILY ALTERNATING WITH 1MG  EVERY OTHER DAY  ONE DAY TAKE 1.5MG  NEXT DAY 1MG  THEN SO ON   Lab Work:  RETURN FOR LABS AS SCHEDULED   Follow-Up in: 2 MONTHS WITH APP AS SCHEDULED   THEN 4 MONTHS WITH [name] PLEASE CALL OUR OFFICE AROUND JANUARY 2025 TO GET SCHEDULED FOR YOUR APPOINTMENT. PHONE NUMBER IS [phone] OPTION 2    At the [facility], you and your health needs are our priority. We have a designated team specialized in the treatment of Heart Failure. This Care Team includes your primary Heart Failure Specialized Cardiologist (physician), Advanced Practice Providers (APPs- Physician Assistants and Nurse Practitioners), and Pharmacist who all work together to provide you with the care you need, when you need it.   You may see any of the following providers on your designated Care Team at your next follow up:  Dr. Arvilla Meres Dr. Marca Ancona Dr. Dorthula Nettles Dr. Theresia Bough Tonye Becket, NP Robbie Lis, [place] [facility] [place], [place] Brynda Peon, NP [place] Lee, NP Karle Plumber, PharmD   Please be sure to bring in all your medications bottles to every appointment.   Need to Contact us:  If you have any questions or concerns before your next appointment please send [place] a message through [place] or call our office at [phone].    TO LEAVE A MESSAGE FOR THE NURSE SELECT OPTION 2, PLEASE LEAVE A MESSAGE INCLUDING: YOUR NAME DATE OF BIRTH CALL BACK NUMBER REASON FOR CALL**this is important as we prioritize the call backs  YOU WILL RECEIVE A CALL BACK THE SAME DAY AS LONG AS YOU CALL BEFORE 4:00 PM

## 2023-06-16 NOTE — Progress Notes (Signed)
ReDS Vest / Clip - 06/16/23 1055       ReDS Vest / Clip   Station Marker A    Ruler Value 25    ReDS Value Range Low volume    ReDS Actual Value 27

## 2023-06-22 ENCOUNTER — Encounter (HOSPITAL_COMMUNITY)
Admission: RE | Admit: 2023-06-22 | Discharge: 2023-06-22 | Disposition: A | Discharge status: Home / Self Care | Payer: Medicare PPO | Source: Ambulatory Visit | Attending: Nephrology | Admitting: Nephrology

## 2023-06-22 VITALS — BP 101/73 | HR 80 | Temp 97.6°F | Resp 16

## 2023-06-22 DIAGNOSIS — N183 Chronic kidney disease, stage 3 unspecified: Secondary | ICD-10-CM | POA: Diagnosis not present

## 2023-06-22 DIAGNOSIS — I5020 Unspecified systolic (congestive) heart failure: Secondary | ICD-10-CM | POA: Diagnosis not present

## 2023-06-22 DIAGNOSIS — S72002D Fracture of unspecified part of neck of left femur, subsequent encounter for closed fracture with routine healing: Secondary | ICD-10-CM | POA: Diagnosis not present

## 2023-06-22 DIAGNOSIS — M6281 Muscle weakness (generalized): Secondary | ICD-10-CM | POA: Diagnosis not present

## 2023-06-22 DIAGNOSIS — R278 Other lack of coordination: Secondary | ICD-10-CM | POA: Diagnosis not present

## 2023-06-22 DIAGNOSIS — N184 Chronic kidney disease, stage 4 (severe): Secondary | ICD-10-CM | POA: Diagnosis not present

## 2023-06-22 DIAGNOSIS — I5022 Chronic systolic (congestive) heart failure: Secondary | ICD-10-CM | POA: Diagnosis not present

## 2023-06-22 LAB — IRON AND TIBC
Iron: 42 ug/dL (ref 28–170)
Saturation Ratios: 12 % (ref 10.4–31.8)
TIBC: 344 ug/dL (ref 250–450)
UIBC: 302 ug/dL

## 2023-06-22 LAB — FERRITIN: Ferritin: 196 ng/mL (ref 11–307)

## 2023-06-22 LAB — POCT HEMOGLOBIN-HEMACUE: Hemoglobin: 10.8 g/dL — ABNORMAL LOW (ref 12.0–15.0)

## 2023-06-22 MED ORDER — EPOETIN ALFA-EPBX 10000 UNIT/ML IJ SOLN
10000.0000 [IU] | INTRAMUSCULAR | Status: DC
  Administered 2023-06-22: 10000 [IU] via SUBCUTANEOUS

## 2023-06-22 MED ORDER — EPOETIN ALFA-EPBX 10000 UNIT/ML IJ SOLN
INTRAMUSCULAR | Status: AC
  Filled 2023-06-22: qty 1

## 2023-06-24 ENCOUNTER — Other Ambulatory Visit (HOSPITAL_COMMUNITY): Payer: Self-pay

## 2023-06-26 ENCOUNTER — Other Ambulatory Visit: Payer: Self-pay | Admitting: Internal Medicine

## 2023-06-26 DIAGNOSIS — H16003 Unspecified corneal ulcer, bilateral: Secondary | ICD-10-CM

## 2023-06-26 DIAGNOSIS — M0579 Rheumatoid arthritis with rheumatoid factor of multiple sites without organ or systems involvement: Secondary | ICD-10-CM

## 2023-06-28 ENCOUNTER — Ambulatory Visit (HOSPITAL_COMMUNITY)
Admission: RE | Admit: 2023-06-28 | Discharge: 2023-06-28 | Disposition: A | Discharge status: Home / Self Care | Payer: Medicare PPO | Source: Ambulatory Visit | Attending: Cardiology | Admitting: Cardiology

## 2023-06-28 ENCOUNTER — Encounter (HOSPITAL_COMMUNITY)
Admission: RE | Admit: 2023-06-28 | Discharge: 2023-06-28 | Disposition: A | Discharge status: Home / Self Care | Payer: Medicare PPO | Source: Ambulatory Visit | Attending: Nephrology

## 2023-06-28 DIAGNOSIS — N183 Chronic kidney disease, stage 3 unspecified: Secondary | ICD-10-CM | POA: Diagnosis not present

## 2023-06-28 DIAGNOSIS — I5022 Chronic systolic (congestive) heart failure: Secondary | ICD-10-CM

## 2023-06-28 DIAGNOSIS — I5020 Unspecified systolic (congestive) heart failure: Secondary | ICD-10-CM | POA: Diagnosis not present

## 2023-06-28 LAB — BASIC METABOLIC PANEL
Anion gap: 9 (ref 5–15)
BUN: 68 mg/dL — ABNORMAL HIGH (ref 8–23)
CO2: 21 mmol/L — ABNORMAL LOW (ref 22–32)
Calcium: 9 mg/dL (ref 8.9–10.3)
Chloride: 108 mmol/L (ref 98–111)
Creatinine, Ser: 3.47 mg/dL — ABNORMAL HIGH (ref 0.44–1.00)
GFR, Estimated: 13 mL/min — ABNORMAL LOW (ref >60)
Glucose, Bld: 141 mg/dL — ABNORMAL HIGH (ref 70–99)
Potassium: 3.6 mmol/L (ref 3.5–5.1)
Sodium: 138 mmol/L (ref 135–145)

## 2023-06-28 MED ORDER — SODIUM CHLORIDE 0.9 % IV SOLN
510.0000 mg | Freq: Once | INTRAVENOUS | Status: AC
  Administered 2023-06-28: 510 mg via INTRAVENOUS
  Filled 2023-06-28: qty 510

## 2023-06-29 ENCOUNTER — Telehealth (HOSPITAL_COMMUNITY): Payer: Self-pay | Admitting: Cardiology

## 2023-06-29 DIAGNOSIS — I5022 Chronic systolic (congestive) heart failure: Secondary | ICD-10-CM

## 2023-06-29 MED ORDER — POTASSIUM CHLORIDE CRYS ER 10 MEQ PO TBCR: 10.0000 meq | EXTENDED_RELEASE_TABLET | Freq: Every day | ORAL | 11 refills | Status: DC

## 2023-06-29 MED ORDER — BUMETANIDE 1 MG PO TABS: ORAL_TABLET | ORAL | 3 refills | Status: DC

## 2023-06-29 NOTE — Telephone Encounter (Signed)
Patient called to report LE edema and SOB, weight slightly increased at 108.  Aware she was volume overloaded at OV 11/6 ane with recent med changes reports she has not gotten any better  Bumex 1.5 alternating with 1mg  daily  Please advise

## 2023-06-29 NOTE — Telephone Encounter (Signed)
Pt aware.

## 2023-07-01 ENCOUNTER — Other Ambulatory Visit: Payer: Self-pay | Admitting: Cardiology

## 2023-07-02 ENCOUNTER — Ambulatory Visit (HOSPITAL_COMMUNITY)
Admission: RE | Admit: 2023-07-02 | Discharge: 2023-07-02 | Disposition: A | Discharge status: Home / Self Care | Payer: Medicare PPO | Source: Ambulatory Visit | Attending: Family Medicine

## 2023-07-02 ENCOUNTER — Encounter (HOSPITAL_COMMUNITY)
Admission: RE | Admit: 2023-07-02 | Discharge: 2023-07-02 | Disposition: A | Discharge status: Home / Self Care | Payer: Medicare PPO | Source: Ambulatory Visit | Attending: Cardiology

## 2023-07-02 ENCOUNTER — Other Ambulatory Visit (HOSPITAL_COMMUNITY): Payer: Self-pay | Admitting: Family Medicine

## 2023-07-02 ENCOUNTER — Encounter (HOSPITAL_COMMUNITY): Payer: Self-pay

## 2023-07-02 ENCOUNTER — Telehealth: Payer: Self-pay | Admitting: Cardiology

## 2023-07-02 VITALS — BP 104/70 | HR 90 | Wt 113.4 lb

## 2023-07-02 DIAGNOSIS — Z8249 Family history of ischemic heart disease and other diseases of the circulatory system: Secondary | ICD-10-CM | POA: Diagnosis not present

## 2023-07-02 DIAGNOSIS — M069 Rheumatoid arthritis, unspecified: Secondary | ICD-10-CM | POA: Insufficient documentation

## 2023-07-02 DIAGNOSIS — I4891 Unspecified atrial fibrillation: Secondary | ICD-10-CM | POA: Insufficient documentation

## 2023-07-02 DIAGNOSIS — D472 Monoclonal gammopathy: Secondary | ICD-10-CM | POA: Diagnosis not present

## 2023-07-02 DIAGNOSIS — R0602 Shortness of breath: Secondary | ICD-10-CM | POA: Diagnosis not present

## 2023-07-02 DIAGNOSIS — I428 Other cardiomyopathies: Secondary | ICD-10-CM | POA: Insufficient documentation

## 2023-07-02 DIAGNOSIS — I509 Heart failure, unspecified: Secondary | ICD-10-CM

## 2023-07-02 DIAGNOSIS — N184 Chronic kidney disease, stage 4 (severe): Secondary | ICD-10-CM | POA: Diagnosis not present

## 2023-07-02 DIAGNOSIS — Z7952 Long term (current) use of systemic steroids: Secondary | ICD-10-CM | POA: Diagnosis not present

## 2023-07-02 DIAGNOSIS — Z7189 Other specified counseling: Secondary | ICD-10-CM | POA: Diagnosis not present

## 2023-07-02 DIAGNOSIS — I5023 Acute on chronic systolic (congestive) heart failure: Secondary | ICD-10-CM | POA: Insufficient documentation

## 2023-07-02 DIAGNOSIS — Z7901 Long term (current) use of anticoagulants: Secondary | ICD-10-CM | POA: Insufficient documentation

## 2023-07-02 DIAGNOSIS — I5020 Unspecified systolic (congestive) heart failure: Secondary | ICD-10-CM

## 2023-07-02 DIAGNOSIS — N183 Chronic kidney disease, stage 3 unspecified: Secondary | ICD-10-CM | POA: Diagnosis not present

## 2023-07-02 DIAGNOSIS — R609 Edema, unspecified: Secondary | ICD-10-CM | POA: Diagnosis not present

## 2023-07-02 DIAGNOSIS — D649 Anemia, unspecified: Secondary | ICD-10-CM

## 2023-07-02 DIAGNOSIS — R0601 Orthopnea: Secondary | ICD-10-CM | POA: Insufficient documentation

## 2023-07-02 DIAGNOSIS — D631 Anemia in chronic kidney disease: Secondary | ICD-10-CM | POA: Diagnosis not present

## 2023-07-02 DIAGNOSIS — R002 Palpitations: Secondary | ICD-10-CM | POA: Diagnosis not present

## 2023-07-02 DIAGNOSIS — I480 Paroxysmal atrial fibrillation: Secondary | ICD-10-CM

## 2023-07-02 DIAGNOSIS — Z79899 Other long term (current) drug therapy: Secondary | ICD-10-CM | POA: Insufficient documentation

## 2023-07-02 LAB — MAGNESIUM: Magnesium: 2.4 mg/dL (ref 1.7–2.4)

## 2023-07-02 LAB — BASIC METABOLIC PANEL
Anion gap: 10 (ref 5–15)
BUN: 67 mg/dL — ABNORMAL HIGH (ref 8–23)
CO2: 20 mmol/L — ABNORMAL LOW (ref 22–32)
Calcium: 9.3 mg/dL (ref 8.9–10.3)
Chloride: 110 mmol/L (ref 98–111)
Creatinine, Ser: 3.12 mg/dL — ABNORMAL HIGH (ref 0.44–1.00)
GFR, Estimated: 15 mL/min — ABNORMAL LOW (ref >60)
Glucose, Bld: 93 mg/dL (ref 70–99)
Potassium: 4.5 mmol/L (ref 3.5–5.1)
Sodium: 140 mmol/L (ref 135–145)

## 2023-07-02 LAB — BRAIN NATRIURETIC PEPTIDE: B Natriuretic Peptide: >4500 pg/mL — ABNORMAL HIGH (ref 0.0–100.0)

## 2023-07-02 MED ORDER — FUROSEMIDE 10 MG/ML IJ SOLN
80.0000 mg | Freq: Once | INTRAMUSCULAR | Status: AC
  Administered 2023-07-02: 80 mg via INTRAVENOUS

## 2023-07-02 MED ORDER — POTASSIUM CHLORIDE 20 MEQ PO PACK
40.0000 meq | PACK | Freq: Once | ORAL | Status: DC
  Filled 2023-07-02: qty 2

## 2023-07-02 MED ORDER — POTASSIUM CHLORIDE CRYS ER 20 MEQ PO TBCR
EXTENDED_RELEASE_TABLET | ORAL | Status: AC
  Administered 2023-07-02: 40 meq
  Filled 2023-07-02: qty 2

## 2023-07-02 MED ORDER — POTASSIUM CHLORIDE CRYS ER 20 MEQ PO TBCR
EXTENDED_RELEASE_TABLET | ORAL | Status: AC
  Filled 2023-07-02: qty 2

## 2023-07-02 MED ORDER — FUROSEMIDE 10 MG/ML IJ SOLN
INTRAMUSCULAR | Status: AC
  Filled 2023-07-02: qty 8

## 2023-07-02 MED ORDER — POTASSIUM CHLORIDE CRYS ER 20 MEQ PO TBCR: 40.0000 meq | EXTENDED_RELEASE_TABLET | Freq: Once | ORAL | Status: AC

## 2023-07-02 MED ORDER — FUROSEMIDE 10 MG/ML IJ SOLN: 80.0000 mg | Freq: Once | INTRAMUSCULAR | Status: DC

## 2023-07-02 NOTE — Patient Instructions (Addendum)
Labs done today. We will contact you only if your labs are abnormal.  No other medication changes were made. Please continue all current medications as prescribed.  USE 1 FUROSCIX KIT DAILY FOR 2 DAYS  TAKE 3 EXTRA TABLETS OF POTASSIUM FOR 2 DAYS  DO NOT TAKE BUMEX WHILE USING FUROSCIX  AFTER 2 DAYS RESTART BUMEX 1.5MG  (1 & 1/2 TABLETS) BY MOUTH DAILY AND POTASSIUM (1 TABLET) BY MOUTH DAILY.   Your physician recommends that you schedule a follow-up appointment in: 7-10 DAYS FOR AN APPOINTMENT WITH OUR NP/PA CLINIC HERE IN OUR OFFICE.   If you have any questions or concerns before your next appointment please send [place] a message through [place] or call our office at [phone].    TO LEAVE A MESSAGE FOR THE NURSE SELECT OPTION 2, PLEASE LEAVE A MESSAGE INCLUDING: YOUR NAME DATE OF BIRTH CALL BACK NUMBER REASON FOR CALL**this is important as we prioritize the call backs  YOU WILL RECEIVE A CALL BACK THE SAME DAY AS LONG AS YOU CALL BEFORE 4:00 PM   Do the following things EVERYDAY: Weigh yourself in the morning before breakfast. Write it down and keep it in a log. Take your medicines as prescribed Eat low salt foods--Limit salt (sodium) to 2000 mg per day.  Stay as active as you can everyday Limit all fluids for the day to less than 2 liters   At the [facility], you and your health needs are our priority. As part of our continuing mission to provide you with exceptional heart care, we have created designated Provider Care Teams. These Care Teams include your primary Cardiologist (physician) and Advanced Practice Providers (APPs- Physician Assistants and Nurse Practitioners) who all work together to provide you with the care you need, when you need it.   You may see any of the following providers on your designated Care Team at your next follow up: Dr Arvilla Meres Dr Marca Ancona Dr. Marcos Eke, NP Robbie Lis, [place] [facility] [place], [place] Brynda Peon, NP Karle Plumber, PharmD   Please be sure to bring in all your medications bottles to every appointment.    Thank you for choosing [facility]

## 2023-07-02 NOTE — Telephone Encounter (Signed)
Patient called after hours to reports worsening LE edema and abdominal edema despite increased dose of Bumex starting 06/29/23. Breathing is slightly worse than baseline and she also notes worsening orthopnea, now sleeping on 3 pillows instead of 2. I discussed patient with [name] who recommended that patient increase Bumex to 2mg  BID with concurrent increase in potassium to BID. Patient will need acute follow up with APP in heart failure clinic. I called and confirmed these recommendations with patient who confirmed understanding with correct read back of instructions.   [name], PA-C

## 2023-07-02 NOTE — Progress Notes (Signed)
[facility] Note   PCP: [name], [name] Nephrology: [name] HF Cardiologist: [name]  77 y.o. with history of rheumatoid arthritis, CKD stage 3, and nonischemic cardiomyopathy was referred by [name] in [place] to establish heart failure care in [place].  Patient has been known to have a cardiomyopathy since 2015.  Cardiac MRI in 2015 showed EF 37% with no LGE.  Coronary angiography at that time showed no significant coronary disease.  Over the next few years, LV EF fluctuated up and down.  In 3/21, echo showed EF down to 20-25%.  She was admitted to the hospital in [place], [place] in 11/21 with CHF exacerbation.  Echo showed EF 20-25%, global hypokinesis, mildly decreased RV systolic function.  Creatinine was noted to be elevated. She was diuresed and sent home, but was readmitted later in 11/21 with ongoing volume overload. Creatinine was up to 2.89.  She was taken off most of her cardiac meds due to soft BP and elevated creatinine. She was discharged to her daughter's house in [place].   Repeated an echo on her in 12/21, EF < 20% with moderate LV dilation and mildly decreased RV systolic function. RHC was done, surprisingly showing low filling pressures and relatively preserved cardiac index at 2.22.   She was admitted in 3/22 with atrial fibrillation, converted to NSR on amiodarone. She went into atrial flutter after this and had TEE-DCCV in 4/22.  TEE showed EF < 20%, moderate LV dilation, mild RV dilation with moderate RV dysfunction, moderate central MR.  CPX in 3/22 showed severe HF limitation.  PYP scan 4/22 was grade 1 with H/CL 1.48, equivocal.  PYP scan repeated in 3/23 and likely negative.   She was evaluated for [facility] or ANTHEM, BNP too high and thought to be too frail.   Cardiac MRI in 10/22 showed mild LV dilation with EF 23%, normal RV size with RVEF 37%, ECV 34%, small area of subendocardial LGE in the mid inferolateral wall and small  area of full thickness LGE in the basal  inferior wall.   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    Admitted 07/08/22 after mechanical fall resulting in left hip fracture. S/P left intramedullary nail 07/10/22.  Post op developed AKI. Given IV fluids . Diuretics adjusted. Echo showed EF improved to 45%. Discharged to [facility].   Admitted in 1/24 with acute on chronic systolic CHF and AKI. She required milrinone, RHC showed CI 2.81 on milrinone 0.25.  Echo showed EF < 20%.  Suspected end stage HF.   Echo 3/24 showed EF 20-25%, mild LV dilation, severely decreased RV systolic function, mild RV enlargement, severe biatrial enlargement, moderate-severe TR, moderate MR, IVC normal.   She was admitted 3/13-3/24/24 w/ symptomatic anemia and dyspepsia. Hgb 6.8,  transfused 2 uRBCs. GI recommended conservative management. There was no overt blood loss-FOBT was negative-gastroenterology did not feel endoscopic evaluation was warranted. She was started on Protonix w/ improvement of symptoms and relieve of  dyspepsia. Hgb remained stable post transfusion. Hospital course was c/b by volume overload. AHF team consulted and she was diuresed w/ IV Lasix, then transitioned back to Bumex.   GDMT limited by CKD and hypotension. Verquvo stopped.   Acute visit 05/27/23, mildly volume overloaded and in atrial fibrillation with RVR. Amiodarone increased to 200 mg bid x 1 week, arranged for TEE (had missed a few doses of Eliquis) and DCCV. She ultimately chemically converted to NSR and  TEE/DCCV aborted.  Today she returns for an acute visit with complaints of worsening SOB and edema. She was seen a couple weeks ago and she felt volume overloaded with NYHA III symptoms. Bumex increased to 1.5 mg alternating with 1 mg every other day. She called answering service with worsening symptoms and instructed to increase Bumex to 2 mg bid. Just started this today. She is SOB with walking  short distances on flat ground. Swelling is worse in abdomen and legs. She has palpitations if she moves too past. She has orthopnea. Denies abnormal bleeding, CP, dizziness, or PND. Marland Kitchen Appetite poor. No fever or chills. Weight at home 110 pounds. Taking all medications. Struggling to limit salt in her diet.  ECG (personally reviewed): SR with PVC 85 bpm  ReDs clip: 19%-->27%-->21%  Labs (12/21): K 5 => 3.7, creatinine 2.89 => 1.9 => 1.69, BNP 2904 Labs (1/22): K 4, creatinine 1.9, hgb 12.5 Labs (3/22): K 3.6, creatinine 2.36, hgb 11.6, TSH normal Labs (4/22): K 3.7, creatinine 2.25, LFTs normal, urine immunofixation normal, pro-BNP 6498 Labs (7/22): Myeloma panel with IgG monoclonal protein Labs (10/22): K 4.1, creatinine 1.96 Labs (12/22): BNP 549, TSH normal, K 4.3, creatinine 1.78 Labs (1/23): SCr 2.61, K 4.0, Hgb 11.4  Lab (3/23): Scr 2.29, K 3.9  Labs (6/23): K 4.1, creatinine 3.82, normal LFTs, normal TSH, hgb 9.4 Labs (07/15/22): K 4.4 Creatinine 2.78  Labs (2/24): K 4.1, creatinine 2.76, hgb 8.3 Labs (6/24): SCR 3.44, Hgb A1c 5, tSat 29%, ferritin 566 Labs (7/24): K 3.6, creatinine 2.93, BNP 996 Labs (8/24): hgb 11.2, BNP 2122, K 3.8, creatinine 2.45 Labs (10/24): K 4.4, creatinine 3.39, normal LFTs, normal TSH Labs (11/24): K 3.6, creatinine 3.47  PMH: 1. GERD 2. Rheumatoid arthritis: Followed by rheumatology in [place].  - No known pulmonary disease from RA.  3. Osteoporosis. 4. Gout 5. CKD stage 4 6. H/o SVT 7. Chronic systolic CHF: She was found to have nonischemic cardiomyopathy with low EF in 2015.  - LHC (2015): Normal coronaries.  - cardiac MRI (2015): EF 37%, no LGE - Echo (3/21): EF 20-25%. - Echo (11/21): EF 20-25%, moderate LV dilation, global hypokinesis, biatrial enlargement, mildly decreased RV systolic function.  - Echo (12/21): EF < 20%, moderate LV dilation, normal RV size/systolic function, moderate MR, IVC normal.  - RHC (1/22): Mean RA 1, PA 39/14,  mean PCWP 10, PVR 3.9 WU, CI 2.22.  - CPX (3/22): peak VO2 10.1, VE/VCO2 55, RER 1.13.  Severe functional limitation due to HF.  - TEE (4/22): EF < 20%, moderate LV dilation, mild RV dilation with moderate RV dysfunction, moderate central MR. - PYP scan (4/22): grade 1 with H/CL 1.48. Equivocal. [employer] gene testing negative. - Cardiac MRI (10/22): mild LV dilation with EF 23%, normal RV size with RVEF 37%, ECV 34%, small area of subendocardial LGE in the mid inferolateral wall and small area of full thickness LGE in the basal inferior wall.  - PYP scan (3/23): grade 1, H/CL 1.3.  Probably negative.  - Echo 07/11/22: EF 45-50% RV normal  - Echo (3/24): EF 20-25%, mild LV dilation, severely decreased RV systolic function, mild RV enlargement, severe biatrial enlargement, moderate-severe TR, moderate MR, IVC normal.  8. Atrial fibrillation/flutter: TEE-DCCV in 4/22.  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  10. Varicella zoster with post-herpetic neuralgia 11. L hip fracture , mechanical fall.  12. Anemia  SH: From [place] but recently moved to [place] to live with  daughter.  Has 3 children.  Nonsmoker.  No ETOH.   FH: Mother with CHF, father with MI.   ROS: All systems reviewed and negative except as per HPI.   Current Outpatient Medications  Medication Sig Dispense Refill   acetaminophen (TYLENOL) 500 MG tablet Take 500 mg by mouth every 6 (six) hours as needed for moderate pain (pain score 4-6).     amiodarone (PACERONE) 200 MG tablet Take 1 tablet (200 mg total) by mouth 2 (two) times daily for 7 days, THEN 1 tablet (200 mg total) daily. (Patient taking differently: Take 200 mg by mouth once daily) 90 tablet 3   bumetanide (BUMEX) 1 MG tablet Take 2 tablets (2 mg total) by mouth daily for 3 days, THEN 1.5 tablets (1.5 mg total) daily. (Patient taking differently: Patient takes 2 tablets by mouth twice a a day.) 135 tablet 3   calcitRIOL (ROCALTROL) 0.25 MCG  capsule Take 2 capsules by mouth daily.     ELIQUIS 2.5 MG TABS tablet TAKE 1 TABLET BY MOUTH TWICE A DAY 60 tablet 5   Ensure (ENSURE) Take 237 mLs by mouth every other day.     folic acid (FOLVITE) 1 MG tablet TAKE 1 TABLET BY MOUTH EVERY DAY 30 tablet 11   hydroxypropyl methylcellulose / hypromellose (ISOPTO TEARS / GONIOVISC) 2.5 % ophthalmic solution Place 1 drop into both eyes as needed for dry eyes.     methocarbamol (ROBAXIN) 500 MG tablet Take 500 mg by mouth 2 (two) times daily as needed for muscle spasms.     Multiple Vitamin (MULTIVITAMIN) tablet Take 1 tablet by mouth 2 (two) times a week.     omeprazole (PRILOSEC) 40 MG capsule TAKE 1 CAPSULE (40 MG TOTAL) BY MOUTH DAILY AS NEEDED (FOR ACID REFLUX). 90 capsule 1   potassium chloride SA (KLOR-CON M) 10 MEQ tablet Take 1 tablet (10 mEq total) by mouth daily. 30 tablet 11   predniSONE (DELTASONE) 5 MG tablet Take 1 tablet (5 mg total) by mouth daily with breakfast. 90 tablet 1   sodium bicarbonate 650 MG tablet Take 650 mg by mouth 2 (two) times daily.     No current facility-administered medications for this encounter.   Wt Readings from Last 3 Encounters:  07/02/23 51.4 kg (113 lb 6.4 oz)  06/28/23 46.7 kg (103 lb)  06/16/23 49.9 kg (110 lb)   BP 104/70   Pulse 90   Wt 51.4 kg (113 lb 6.4 oz)   SpO2 98%   BMI 20.09 kg/m   PHYSICAL EXAM: General:  NAD. No resp difficulty, arrived in [facility], frail and cachectic HEENT: Normal Neck: Supple. JVP 10. Carotids 2+ bilat; no bruits. No lymphadenopathy or thryomegaly appreciated. Cor: PMI nondisplaced. Regular rate & rhythm. No rubs, gallops or murmurs. Lungs: Clear, diminished Abdomen: Soft, + tender, + distended. No hepatosplenomegaly. No bruits or masses. Good bowel sounds. Extremities: No cyanosis, clubbing, rash, 2-3+ BLE edema to thighs Neuro: Alert & oriented x 3, cranial nerves grossly intact. Moves all 4 extremities w/o difficulty. Affect pleasant.  Assessment/Plan: 1.  Acute on Chronic Systolic CHF: Nonischemic cardiomyopathy.  This has been known since 2015, cath in 2015 showed no coronary disease and cardiac MRI in 2015 showed no LGE.  Cause is uncertain, familial cardiomyopathy is a concern given nonischemic cardiomyopathy in her mother.  Cannot rule out remote viral myocarditis.Poor appetite and severe HF limitation on CPX in 3/22 is concerning, as is elevated creatinine (suspect cardiorenal syndrome).  Low BP and  elevated creatinine have limited her cardiac med regimen. RHC (1/22) showed low filling pressures and actually a relatively preserved cardiac index of 2.22.  PYP scan in 4/22 was equivocal, PYP scan repeated in 3/23 and likely negative.  Cardiac MRI in 10/22 showed mild LV dilation with EF 23%, normal RV size with RVEF 37%, ECV 34%, small area of subendocardial LGE in the mid inferolateral wall and small area of full thickness LGE in the basal inferior wall.   This was not suggestive of cardiac amyloidosis but possibly could be consistent with coronary embolism (though no LV thrombus visualized) versus prior myocarditis versus sarcoidosis. With renal failure, CHF, and atrial arrhythmias, we have assessed for cardiac amyloidosis.  PYP scan was equivocal in 4/22 for transthyretin cardiac amyloidosis.  It was repeated in 3/23 and likely negative.  [employer] gene testing for TTR mutations was negative. The cardiac MRI was not consistent with amyloidosis.  She has MGUS with monoclonal IgG paraprotein, but based on cMRI and slow progression, think AL amyloidosis is unlikely.  Most recent echo 3/24 with EF 20-25%, mild LV dilation, severely decreased RV systolic function, mild RV enlargement, severe biatrial enlargement, moderate-severe TR, moderate MR, IVC normal. She was admitted in 1/24 and required milrinone. Chronically NYHA class IIIb symptoms and CKD stage IV.  Suspect she is end-stage.  Not a candidate for advanced therapies. She is not volume overloaded today,  although ReDs not impressive at 21% - Arrange for 80 mg IV Lasix + 40 KCL in infusion clinic today. BMET and BNP today. - Starting tomorrow, she will use Furoscix + 40 KCL x 2 days. She will hold her Bumex while using Furoscix - After 2 days of Furoscix, she will restart Bumex at 1.5 mg daily with 10 KCL daily. - Off Verquvo with hypotension.  - Off dapagliflozin with rise in SCr, with low BP would not restart.   - Off spironolactone with elevated SCr and soft BP.  - Does not have an ICD, would not place with end stage HF.   2. CKD: Stage 4.  She is followed by [name].  - Most recent SCr 3.47. 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 3. Rheumatoid arthritis: No history of lung involvement. She has been on a low dose of prednisone chronically. No off Imuran, felt to be contributing to anemia  - She follows with Dr. Dimple Casey  4. Atrial fibrillation/flutter: S/p TEE-DCCV in 4/22. She is now on amiodarone.  She was in atypical atrial flutter 10/24. She converted to NSR and remains in SR on ECG today.  She does not tolerate atrial arrhythmias. - Continue amiodarone 200 mg daily.  Recent LFTs and TSH normal.  - Continue Eliquis 5 mg bid. No bleeding issues.  5. Anemia: She is receiving iron infusions per nephrology. - Recent CBC stable. 6. GOC: Has been referred to palliative care, not ready for hospice. Re-visit at next appt.  Follow up in 7-10 days with APP. She is tenuous and high risk for re-admission. Discussed ED precautions should symptoms worsen over the weekend.  [name] [place], [name]-BC 07/02/2023

## 2023-07-02 NOTE — Progress Notes (Signed)
Medication Samples have been provided to the patient.  Drug name: Furoscix       Strength: 80mg /88mL        Qty: 2  LOT: [id number]  Exp.Date: 12/07/2024  Dosing instructions: use 1 kit qd  The patient has been instructed regarding the correct time, dose, and frequency of taking this medication, including desired effects and most common side effects.   Tracy Nelson [name] 2:40 PM 07/02/2023

## 2023-07-02 NOTE — Progress Notes (Signed)
ReDS Vest / Clip - 07/02/23 1300       ReDS Vest / Clip   Station Marker A    Ruler Value 28    ReDS Value Range Low volume    ReDS Actual Value 21

## 2023-07-02 NOTE — Telephone Encounter (Signed)
Spoke w/pt, she is concerned about increased LE edema, per Dr Shirlee Latch needs asap appt. Appt sch for today at 1:30 with APP, pt agreeable

## 2023-07-07 ENCOUNTER — Ambulatory Visit (HOSPITAL_COMMUNITY)
Admission: RE | Admit: 2023-07-07 | Discharge: 2023-07-07 | Disposition: A | Discharge status: Home / Self Care | Payer: Medicare PPO | Source: Ambulatory Visit | Attending: Cardiology | Admitting: Cardiology

## 2023-07-07 DIAGNOSIS — D472 Monoclonal gammopathy: Secondary | ICD-10-CM | POA: Diagnosis present

## 2023-07-07 DIAGNOSIS — Z515 Encounter for palliative care: Secondary | ICD-10-CM | POA: Diagnosis not present

## 2023-07-07 DIAGNOSIS — J9811 Atelectasis: Secondary | ICD-10-CM | POA: Diagnosis not present

## 2023-07-07 DIAGNOSIS — D631 Anemia in chronic kidney disease: Secondary | ICD-10-CM | POA: Diagnosis present

## 2023-07-07 DIAGNOSIS — K219 Gastro-esophageal reflux disease without esophagitis: Secondary | ICD-10-CM | POA: Diagnosis not present

## 2023-07-07 DIAGNOSIS — Z7189 Other specified counseling: Secondary | ICD-10-CM | POA: Diagnosis not present

## 2023-07-07 DIAGNOSIS — I5021 Acute systolic (congestive) heart failure: Secondary | ICD-10-CM | POA: Diagnosis not present

## 2023-07-07 DIAGNOSIS — N184 Chronic kidney disease, stage 4 (severe): Secondary | ICD-10-CM | POA: Diagnosis present

## 2023-07-07 DIAGNOSIS — I480 Paroxysmal atrial fibrillation: Secondary | ICD-10-CM | POA: Diagnosis present

## 2023-07-07 DIAGNOSIS — M3500 Sicca syndrome, unspecified: Secondary | ICD-10-CM | POA: Diagnosis present

## 2023-07-07 DIAGNOSIS — I5023 Acute on chronic systolic (congestive) heart failure: Secondary | ICD-10-CM | POA: Diagnosis present

## 2023-07-07 DIAGNOSIS — R109 Unspecified abdominal pain: Secondary | ICD-10-CM | POA: Diagnosis not present

## 2023-07-07 DIAGNOSIS — I083 Combined rheumatic disorders of mitral, aortic and tricuspid valves: Secondary | ICD-10-CM | POA: Diagnosis present

## 2023-07-07 DIAGNOSIS — I472 Ventricular tachycardia, unspecified: Secondary | ICD-10-CM | POA: Diagnosis present

## 2023-07-07 DIAGNOSIS — R64 Cachexia: Secondary | ICD-10-CM | POA: Diagnosis present

## 2023-07-07 DIAGNOSIS — R54 Age-related physical debility: Secondary | ICD-10-CM | POA: Diagnosis present

## 2023-07-07 DIAGNOSIS — I420 Dilated cardiomyopathy: Secondary | ICD-10-CM | POA: Diagnosis present

## 2023-07-07 DIAGNOSIS — D6859 Other primary thrombophilia: Secondary | ICD-10-CM | POA: Diagnosis present

## 2023-07-07 DIAGNOSIS — I5084 End stage heart failure: Secondary | ICD-10-CM | POA: Diagnosis present

## 2023-07-07 DIAGNOSIS — I5082 Biventricular heart failure: Secondary | ICD-10-CM | POA: Diagnosis present

## 2023-07-07 DIAGNOSIS — I509 Heart failure, unspecified: Secondary | ICD-10-CM | POA: Diagnosis present

## 2023-07-07 DIAGNOSIS — N250 Renal osteodystrophy: Secondary | ICD-10-CM | POA: Diagnosis present

## 2023-07-07 DIAGNOSIS — I110 Hypertensive heart disease with heart failure: Secondary | ICD-10-CM | POA: Diagnosis not present

## 2023-07-07 DIAGNOSIS — N179 Acute kidney failure, unspecified: Secondary | ICD-10-CM | POA: Diagnosis not present

## 2023-07-07 DIAGNOSIS — I959 Hypotension, unspecified: Secondary | ICD-10-CM | POA: Diagnosis present

## 2023-07-07 DIAGNOSIS — R918 Other nonspecific abnormal finding of lung field: Secondary | ICD-10-CM | POA: Diagnosis not present

## 2023-07-07 DIAGNOSIS — Z681 Body mass index (BMI) 19 or less, adult: Secondary | ICD-10-CM | POA: Diagnosis not present

## 2023-07-07 DIAGNOSIS — J90 Pleural effusion, not elsewhere classified: Secondary | ICD-10-CM | POA: Diagnosis not present

## 2023-07-07 DIAGNOSIS — M0579 Rheumatoid arthritis with rheumatoid factor of multiple sites without organ or systems involvement: Secondary | ICD-10-CM | POA: Diagnosis not present

## 2023-07-07 DIAGNOSIS — I272 Pulmonary hypertension, unspecified: Secondary | ICD-10-CM | POA: Diagnosis present

## 2023-07-07 DIAGNOSIS — R0602 Shortness of breath: Secondary | ICD-10-CM | POA: Diagnosis not present

## 2023-07-07 DIAGNOSIS — I4892 Unspecified atrial flutter: Secondary | ICD-10-CM | POA: Diagnosis not present

## 2023-07-07 DIAGNOSIS — I5022 Chronic systolic (congestive) heart failure: Secondary | ICD-10-CM

## 2023-07-07 DIAGNOSIS — I130 Hypertensive heart and chronic kidney disease with heart failure and stage 1 through stage 4 chronic kidney disease, or unspecified chronic kidney disease: Secondary | ICD-10-CM | POA: Diagnosis present

## 2023-07-07 DIAGNOSIS — I10 Essential (primary) hypertension: Secondary | ICD-10-CM | POA: Diagnosis not present

## 2023-07-07 LAB — BASIC METABOLIC PANEL
Anion gap: 10 (ref 5–15)
BUN: 70 mg/dL — ABNORMAL HIGH (ref 8–23)
CO2: 20 mmol/L — ABNORMAL LOW (ref 22–32)
Calcium: 9.4 mg/dL (ref 8.9–10.3)
Chloride: 111 mmol/L (ref 98–111)
Creatinine, Ser: 3.5 mg/dL — ABNORMAL HIGH (ref 0.44–1.00)
GFR, Estimated: 13 mL/min — ABNORMAL LOW (ref >60)
Glucose, Bld: 132 mg/dL — ABNORMAL HIGH (ref 70–99)
Potassium: 4 mmol/L (ref 3.5–5.1)
Sodium: 141 mmol/L (ref 135–145)

## 2023-07-07 LAB — BRAIN NATRIURETIC PEPTIDE: B Natriuretic Peptide: >4500 pg/mL — ABNORMAL HIGH (ref 0.0–100.0)

## 2023-07-07 NOTE — Assessment & Plan Note (Addendum)
Her kidney functions declined while in the hospital and discussed what CKD is and risk. She is to f/u with Nephrology. Will recheck her levels. I have sent a copy of her results to her nephrologist

## 2023-07-07 NOTE — Assessment & Plan Note (Signed)

## 2023-07-07 NOTE — Assessment & Plan Note (Signed)
Blood pressure is well controlled, continue current medications. Continue f/u with Cardiologist

## 2023-07-07 NOTE — Assessment & Plan Note (Signed)
Cardioverted chemically, continue f/u with Cardiology.

## 2023-07-07 NOTE — Assessment & Plan Note (Signed)

## 2023-07-07 NOTE — Assessment & Plan Note (Signed)
Stable, diet controlled.

## 2023-07-07 NOTE — Assessment & Plan Note (Signed)
Declines shingrix, educated on disease process and is aware if he changes his mind to notify office  

## 2023-07-07 NOTE — Assessment & Plan Note (Signed)
TCM Performed. A member of the clinical team spoke with the patient upon dischare. Discharge summary was reviewed in full detail during the visit. Meds reconciled and compared to discharge meds. Medication list is updated and reviewed with the patient.  Greater than 50% face to face time was spent in counseling an coordination of care.  All questions were answered to the satisfaction of the patient.

## 2023-07-07 NOTE — Progress Notes (Signed)
Patient here today for labs- patient has increased swelling/ +2 pitting edema to her legs. Patient took prescribed doses of furoscix and restarted bumex as directed.   Advised patient she should be seen in the ER to be evaluated due to this. Patient reports that she does not want to go to the ER today due to thanksgiving tomorrow.   Spoke with Aniceto Boss, NP- recommended another dose of Furoscix with additional potassium dose. And to also watch sodium intake today and tomorrow. Patient to go to the ER Friday for evaluation.   Patient is aware of all instructions and verbalized understanding.

## 2023-07-07 NOTE — Addendum Note (Signed)
Encounter addended by: Baird Cancer, RN on: 07/07/2023 12:16 PM  Actions taken: Clinical Note Signed

## 2023-07-07 NOTE — Assessment & Plan Note (Signed)
Recent hospitalization but this was stable. Encouraged to keep bilateral lower extremities elevated and wear compression socks only during the day

## 2023-07-09 ENCOUNTER — Other Ambulatory Visit: Payer: Self-pay

## 2023-07-09 ENCOUNTER — Inpatient Hospital Stay (HOSPITAL_COMMUNITY)
Admission: EM | Admit: 2023-07-09 | Discharge: 2023-07-16 | DRG: 291 | Disposition: A | Discharge status: Home Health | Payer: Medicare PPO | Attending: Internal Medicine | Admitting: Internal Medicine

## 2023-07-09 ENCOUNTER — Emergency Department (HOSPITAL_COMMUNITY): Payer: Medicare PPO

## 2023-07-09 ENCOUNTER — Encounter (HOSPITAL_COMMUNITY): Payer: Self-pay

## 2023-07-09 DIAGNOSIS — I480 Paroxysmal atrial fibrillation: Secondary | ICD-10-CM | POA: Diagnosis not present

## 2023-07-09 DIAGNOSIS — Z681 Body mass index (BMI) 19 or less, adult: Secondary | ICD-10-CM

## 2023-07-09 DIAGNOSIS — I5021 Acute systolic (congestive) heart failure: Secondary | ICD-10-CM | POA: Diagnosis not present

## 2023-07-09 DIAGNOSIS — I959 Hypotension, unspecified: Secondary | ICD-10-CM | POA: Diagnosis present

## 2023-07-09 DIAGNOSIS — Z79899 Other long term (current) drug therapy: Secondary | ICD-10-CM

## 2023-07-09 DIAGNOSIS — I110 Hypertensive heart disease with heart failure: Secondary | ICD-10-CM | POA: Diagnosis not present

## 2023-07-09 DIAGNOSIS — I5023 Acute on chronic systolic (congestive) heart failure: Secondary | ICD-10-CM | POA: Diagnosis present

## 2023-07-09 DIAGNOSIS — R0602 Shortness of breath: Secondary | ICD-10-CM | POA: Diagnosis not present

## 2023-07-09 DIAGNOSIS — I420 Dilated cardiomyopathy: Secondary | ICD-10-CM | POA: Diagnosis present

## 2023-07-09 DIAGNOSIS — J90 Pleural effusion, not elsewhere classified: Secondary | ICD-10-CM | POA: Diagnosis not present

## 2023-07-09 DIAGNOSIS — D6869 Other thrombophilia: Secondary | ICD-10-CM | POA: Diagnosis present

## 2023-07-09 DIAGNOSIS — I509 Heart failure, unspecified: Principal | ICD-10-CM | POA: Insufficient documentation

## 2023-07-09 DIAGNOSIS — N250 Renal osteodystrophy: Secondary | ICD-10-CM | POA: Diagnosis present

## 2023-07-09 DIAGNOSIS — I5082 Biventricular heart failure: Secondary | ICD-10-CM

## 2023-07-09 DIAGNOSIS — Z833 Family history of diabetes mellitus: Secondary | ICD-10-CM

## 2023-07-09 DIAGNOSIS — Z7901 Long term (current) use of anticoagulants: Secondary | ICD-10-CM

## 2023-07-09 DIAGNOSIS — J9811 Atelectasis: Secondary | ICD-10-CM | POA: Diagnosis not present

## 2023-07-09 DIAGNOSIS — I493 Ventricular premature depolarization: Secondary | ICD-10-CM | POA: Diagnosis present

## 2023-07-09 DIAGNOSIS — Z5982 Transportation insecurity: Secondary | ICD-10-CM

## 2023-07-09 DIAGNOSIS — M3500 Sicca syndrome, unspecified: Secondary | ICD-10-CM | POA: Diagnosis present

## 2023-07-09 DIAGNOSIS — M0579 Rheumatoid arthritis with rheumatoid factor of multiple sites without organ or systems involvement: Secondary | ICD-10-CM | POA: Diagnosis present

## 2023-07-09 DIAGNOSIS — R64 Cachexia: Secondary | ICD-10-CM | POA: Diagnosis present

## 2023-07-09 DIAGNOSIS — N179 Acute kidney failure, unspecified: Secondary | ICD-10-CM | POA: Diagnosis not present

## 2023-07-09 DIAGNOSIS — Z880 Allergy status to penicillin: Secondary | ICD-10-CM

## 2023-07-09 DIAGNOSIS — Z515 Encounter for palliative care: Secondary | ICD-10-CM

## 2023-07-09 DIAGNOSIS — N184 Chronic kidney disease, stage 4 (severe): Secondary | ICD-10-CM | POA: Diagnosis not present

## 2023-07-09 DIAGNOSIS — D472 Monoclonal gammopathy: Secondary | ICD-10-CM | POA: Diagnosis present

## 2023-07-09 DIAGNOSIS — M810 Age-related osteoporosis without current pathological fracture: Secondary | ICD-10-CM | POA: Diagnosis present

## 2023-07-09 DIAGNOSIS — R918 Other nonspecific abnormal finding of lung field: Secondary | ICD-10-CM | POA: Diagnosis not present

## 2023-07-09 DIAGNOSIS — I10 Essential (primary) hypertension: Secondary | ICD-10-CM | POA: Diagnosis present

## 2023-07-09 DIAGNOSIS — R54 Age-related physical debility: Secondary | ICD-10-CM | POA: Diagnosis present

## 2023-07-09 DIAGNOSIS — D638 Anemia in other chronic diseases classified elsewhere: Secondary | ICD-10-CM | POA: Diagnosis present

## 2023-07-09 DIAGNOSIS — Z8261 Family history of arthritis: Secondary | ICD-10-CM

## 2023-07-09 DIAGNOSIS — R109 Unspecified abdominal pain: Secondary | ICD-10-CM | POA: Diagnosis present

## 2023-07-09 DIAGNOSIS — I083 Combined rheumatic disorders of mitral, aortic and tricuspid valves: Secondary | ICD-10-CM | POA: Diagnosis present

## 2023-07-09 DIAGNOSIS — Z888 Allergy status to other drugs, medicaments and biological substances status: Secondary | ICD-10-CM

## 2023-07-09 DIAGNOSIS — R627 Adult failure to thrive: Secondary | ICD-10-CM | POA: Diagnosis present

## 2023-07-09 DIAGNOSIS — I472 Ventricular tachycardia, unspecified: Secondary | ICD-10-CM | POA: Diagnosis present

## 2023-07-09 DIAGNOSIS — I130 Hypertensive heart and chronic kidney disease with heart failure and stage 1 through stage 4 chronic kidney disease, or unspecified chronic kidney disease: Principal | ICD-10-CM | POA: Diagnosis present

## 2023-07-09 DIAGNOSIS — Z803 Family history of malignant neoplasm of breast: Secondary | ICD-10-CM

## 2023-07-09 DIAGNOSIS — D6859 Other primary thrombophilia: Secondary | ICD-10-CM | POA: Diagnosis present

## 2023-07-09 DIAGNOSIS — R7989 Other specified abnormal findings of blood chemistry: Secondary | ICD-10-CM | POA: Insufficient documentation

## 2023-07-09 DIAGNOSIS — I4892 Unspecified atrial flutter: Secondary | ICD-10-CM | POA: Diagnosis present

## 2023-07-09 DIAGNOSIS — Z8249 Family history of ischemic heart disease and other diseases of the circulatory system: Secondary | ICD-10-CM

## 2023-07-09 DIAGNOSIS — K219 Gastro-esophageal reflux disease without esophagitis: Secondary | ICD-10-CM | POA: Diagnosis present

## 2023-07-09 DIAGNOSIS — I5084 End stage heart failure: Secondary | ICD-10-CM | POA: Diagnosis present

## 2023-07-09 DIAGNOSIS — I272 Pulmonary hypertension, unspecified: Secondary | ICD-10-CM | POA: Diagnosis present

## 2023-07-09 DIAGNOSIS — I428 Other cardiomyopathies: Secondary | ICD-10-CM

## 2023-07-09 DIAGNOSIS — D631 Anemia in chronic kidney disease: Secondary | ICD-10-CM | POA: Diagnosis present

## 2023-07-09 DIAGNOSIS — Z7952 Long term (current) use of systemic steroids: Secondary | ICD-10-CM

## 2023-07-09 HISTORY — DX: Nonrheumatic aortic (valve) insufficiency: I35.1

## 2023-07-09 HISTORY — DX: Rheumatic tricuspid insufficiency: I07.1

## 2023-07-09 HISTORY — DX: Unspecified atrial flutter: I48.92

## 2023-07-09 HISTORY — DX: Nonrheumatic mitral (valve) insufficiency: I34.0

## 2023-07-09 HISTORY — DX: Paroxysmal atrial fibrillation: I48.0

## 2023-07-09 LAB — COMPREHENSIVE METABOLIC PANEL
ALT: 24 U/L (ref 0–44)
AST: 20 U/L (ref 15–41)
Albumin: 2.9 g/dL — ABNORMAL LOW (ref 3.5–5.0)
Alkaline Phosphatase: 77 U/L (ref 38–126)
Anion gap: 9 (ref 5–15)
BUN: 66 mg/dL — ABNORMAL HIGH (ref 8–23)
CO2: 21 mmol/L — ABNORMAL LOW (ref 22–32)
Calcium: 9.7 mg/dL (ref 8.9–10.3)
Chloride: 111 mmol/L (ref 98–111)
Creatinine, Ser: 3.45 mg/dL — ABNORMAL HIGH (ref 0.44–1.00)
GFR, Estimated: 13 mL/min — ABNORMAL LOW (ref >60)
Glucose, Bld: 151 mg/dL — ABNORMAL HIGH (ref 70–99)
Potassium: 4.5 mmol/L (ref 3.5–5.1)
Sodium: 141 mmol/L (ref 135–145)
Total Bilirubin: 0.7 mg/dL (ref <1.2)
Total Protein: 5.8 g/dL — ABNORMAL LOW (ref 6.5–8.1)

## 2023-07-09 LAB — CBC WITH DIFFERENTIAL/PLATELET
Abs Immature Granulocytes: 0.06 10*3/uL (ref 0.00–0.07)
Basophils Absolute: 0 10*3/uL (ref 0.0–0.1)
Basophils Relative: 0 %
Eosinophils Absolute: 0 10*3/uL (ref 0.0–0.5)
Eosinophils Relative: 0 %
HCT: 33.8 % — ABNORMAL LOW (ref 36.0–46.0)
Hemoglobin: 10.1 g/dL — ABNORMAL LOW (ref 12.0–15.0)
Immature Granulocytes: 1 %
Lymphocytes Relative: 8 %
Lymphs Abs: 0.8 10*3/uL (ref 0.7–4.0)
MCH: 28.8 pg (ref 26.0–34.0)
MCHC: 29.9 g/dL — ABNORMAL LOW (ref 30.0–36.0)
MCV: 96.3 fL (ref 80.0–100.0)
Monocytes Absolute: 0.5 10*3/uL (ref 0.1–1.0)
Monocytes Relative: 5 %
Neutro Abs: 7.8 10*3/uL — ABNORMAL HIGH (ref 1.7–7.7)
Neutrophils Relative %: 86 %
Platelets: 161 10*3/uL (ref 150–400)
RBC: 3.51 MIL/uL — ABNORMAL LOW (ref 3.87–5.11)
RDW: 17.4 % — ABNORMAL HIGH (ref 11.5–15.5)
WBC: 9.1 10*3/uL (ref 4.0–10.5)
nRBC: 0 % (ref 0.0–0.2)

## 2023-07-09 LAB — TROPONIN I (HIGH SENSITIVITY)
Troponin I (High Sensitivity): 32 ng/L — ABNORMAL HIGH (ref <18)
Troponin I (High Sensitivity): 32 ng/L — ABNORMAL HIGH (ref <18)

## 2023-07-09 LAB — BRAIN NATRIURETIC PEPTIDE: B Natriuretic Peptide: >4500 pg/mL — ABNORMAL HIGH (ref 0.0–100.0)

## 2023-07-09 LAB — LACTIC ACID, PLASMA: Lactic Acid, Venous: 0.9 mmol/L (ref 0.5–1.9)

## 2023-07-09 MED ORDER — CALCITRIOL 0.5 MCG PO CAPS
0.5000 ug | ORAL_CAPSULE | Freq: Every day | ORAL | Status: DC
  Administered 2023-07-09 – 2023-07-16 (×8): 0.5 ug via ORAL
  Filled 2023-07-09 (×9): qty 1

## 2023-07-09 MED ORDER — PANTOPRAZOLE SODIUM 40 MG PO TBEC
40.0000 mg | DELAYED_RELEASE_TABLET | Freq: Every day | ORAL | Status: DC
  Administered 2023-07-10 – 2023-07-16 (×7): 40 mg via ORAL
  Filled 2023-07-09 (×7): qty 1

## 2023-07-09 MED ORDER — SODIUM CHLORIDE 0.9% FLUSH
3.0000 mL | Freq: Two times a day (BID) | INTRAVENOUS | Status: DC
  Administered 2023-07-09 – 2023-07-16 (×15): 3 mL via INTRAVENOUS

## 2023-07-09 MED ORDER — ONDANSETRON HCL 4 MG/2ML IJ SOLN: 4.0000 mg | Freq: Four times a day (QID) | INTRAMUSCULAR | Status: DC | PRN

## 2023-07-09 MED ORDER — FUROSEMIDE 10 MG/ML IJ SOLN
40.0000 mg | Freq: Once | INTRAMUSCULAR | Status: AC
  Administered 2023-07-09: 40 mg via INTRAVENOUS
  Filled 2023-07-09: qty 4

## 2023-07-09 MED ORDER — AMIODARONE HCL 200 MG PO TABS
200.0000 mg | ORAL_TABLET | Freq: Every day | ORAL | Status: DC
  Administered 2023-07-09 – 2023-07-16 (×8): 200 mg via ORAL
  Filled 2023-07-09 (×8): qty 1

## 2023-07-09 MED ORDER — POTASSIUM CHLORIDE CRYS ER 10 MEQ PO TBCR
10.0000 meq | EXTENDED_RELEASE_TABLET | Freq: Every day | ORAL | Status: DC
  Administered 2023-07-09: 10 meq via ORAL
  Filled 2023-07-09: qty 1

## 2023-07-09 MED ORDER — ACETAMINOPHEN 500 MG PO TABS: 500.0000 mg | ORAL_TABLET | Freq: Four times a day (QID) | ORAL | Status: DC | PRN

## 2023-07-09 MED ORDER — SODIUM BICARBONATE 650 MG PO TABS
650.0000 mg | ORAL_TABLET | Freq: Two times a day (BID) | ORAL | Status: DC
  Administered 2023-07-09 – 2023-07-11 (×5): 650 mg via ORAL
  Filled 2023-07-09 (×5): qty 1

## 2023-07-09 MED ORDER — ACETAMINOPHEN 325 MG PO TABS: 650.0000 mg | ORAL_TABLET | ORAL | Status: DC | PRN

## 2023-07-09 MED ORDER — POTASSIUM CHLORIDE CRYS ER 20 MEQ PO TBCR
20.0000 meq | EXTENDED_RELEASE_TABLET | Freq: Two times a day (BID) | ORAL | Status: DC
  Administered 2023-07-10: 20 meq via ORAL
  Filled 2023-07-09: qty 1

## 2023-07-09 MED ORDER — FUROSEMIDE 10 MG/ML IJ SOLN
40.0000 mg | Freq: Two times a day (BID) | INTRAMUSCULAR | Status: DC
  Administered 2023-07-09 – 2023-07-10 (×2): 40 mg via INTRAVENOUS
  Filled 2023-07-09 (×2): qty 4

## 2023-07-09 MED ORDER — SODIUM CHLORIDE 0.9% FLUSH: 3.0000 mL | INTRAVENOUS | Status: DC | PRN

## 2023-07-09 MED ORDER — PREDNISONE 5 MG PO TABS
5.0000 mg | ORAL_TABLET | Freq: Every day | ORAL | Status: DC
  Administered 2023-07-10 – 2023-07-16 (×7): 5 mg via ORAL
  Filled 2023-07-09 (×8): qty 1

## 2023-07-09 MED ORDER — AMIODARONE HCL 200 MG PO TABS: 200.0000 mg | ORAL_TABLET | Freq: Two times a day (BID) | ORAL | Status: DC

## 2023-07-09 MED ORDER — APIXABAN 2.5 MG PO TABS
2.5000 mg | ORAL_TABLET | Freq: Two times a day (BID) | ORAL | Status: DC
  Administered 2023-07-09 – 2023-07-16 (×15): 2.5 mg via ORAL
  Filled 2023-07-09 (×15): qty 1

## 2023-07-09 MED ORDER — FOLIC ACID 1 MG PO TABS
1.0000 mg | ORAL_TABLET | Freq: Every day | ORAL | Status: DC
  Administered 2023-07-09 – 2023-07-16 (×8): 1 mg via ORAL
  Filled 2023-07-09 (×8): qty 1

## 2023-07-09 MED ORDER — SODIUM CHLORIDE 0.9 % IV SOLN: 250.0000 mL | INTRAVENOUS | Status: AC | PRN

## 2023-07-09 NOTE — ED Provider Notes (Addendum)
[place] EMERGENCY DEPARTMENT AT [facility] Provider Note   CSN: [id number] Arrival date & time: 07/09/23  6283     History  Chief Complaint  Patient presents with   Shortness of Breath   Leg Swelling    Tracy Nelson is a 77 y.o. female.  Patient complains of shortness of breath.  Patient reports a history of congestive heart failure.  Patient has been on oral Lasix and getting injections of Lasix.  Patient reports she has swelling in her legs and she is more short of breath than usual.  Patient complains of pain in her upper back.  Patient has a past medical history of chronic heart failure chronic kidney disease stage IV atrial fibrillation chronic anemia and cardiomyopathy.  The history is provided by the patient and a relative. No language interpreter was used.  Shortness of Breath Severity:  Moderate Onset quality:  Gradual Timing:  Constant Progression:  Worsening Chronicity:  Chronic Relieved by:  Nothing Worsened by:  Nothing Ineffective treatments:  None tried Associated symptoms: no abdominal pain        Home Medications Prior to Admission medications   Medication Sig Start Date End Date Taking? Authorizing Provider  acetaminophen (TYLENOL) 500 MG tablet Take 500 mg by mouth every 6 (six) hours as needed for moderate pain (pain score 4-6).    [provider]  amiodarone (PACERONE) 200 MG tablet Take 1 tablet (200 mg total) by mouth 2 (two) times daily for 7 days, THEN 1 tablet (200 mg total) daily. Patient taking differently: Take 200 mg by mouth once daily 05/27/23 05/21/24  Laurey Morale, MD  bumetanide (BUMEX) 1 MG tablet Take 2 tablets (2 mg total) by mouth daily for 3 days, THEN 1.5 tablets (1.5 mg total) daily. Patient taking differently: Patient takes 2 tablets by mouth twice a a day. 06/29/23 07/01/24  Jacklynn Ganong, [name]  calcitRIOL (ROCALTROL) 0.25 MCG capsule Take 2 capsules by mouth daily. 06/25/22   [provider]  ELIQUIS 2.5 MG TABS tablet TAKE 1 TABLET BY MOUTH TWICE A DAY 10/05/22   Laurey Morale, MD  Ensure (ENSURE) Take 237 mLs by mouth every other day.    [provider]  folic acid (FOLVITE) 1 MG tablet TAKE 1 TABLET BY MOUTH EVERY DAY 07/22/22   Laurey Morale, MD  hydroxypropyl methylcellulose / hypromellose (ISOPTO TEARS / GONIOVISC) 2.5 % ophthalmic solution Place 1 drop into both eyes as needed for dry eyes.    [provider]  methocarbamol (ROBAXIN) 500 MG tablet Take 500 mg by mouth 2 (two) times daily as needed for muscle spasms.    [provider]  Multiple Vitamin (MULTIVITAMIN) tablet Take 1 tablet by mouth 2 (two) times a week.    [provider]  omeprazole (PRILOSEC) 40 MG capsule TAKE 1 CAPSULE (40 MG TOTAL) BY MOUTH DAILY AS NEEDED (FOR ACID REFLUX). 04/13/23   [name], [name]  potassium chloride SA (KLOR-CON M) 10 MEQ tablet Take 1 tablet (10 mEq total) by mouth daily. 06/29/23   Milford, [name], [name]  predniSONE (DELTASONE) 5 MG tablet Take 1 tablet (5 mg total) by mouth daily with breakfast. 03/30/23   Rice, Jamesetta Orleans, MD  sodium bicarbonate 650 MG tablet Take 650 mg by mouth 2 (two) times daily. 02/23/23   [provider]      Allergies    Baclofen and Penicillins    Review of Systems   Review of  Systems  Respiratory:  Positive for shortness of breath.   Gastrointestinal:  Negative for abdominal pain.  All other systems reviewed and are negative.   Physical Exam Updated Vital Signs BP 103/72   Pulse 85   Temp 98.2 F (36.8 C) (Oral)   Resp 20   Ht 5\' 3"  (1.6 m)   Wt 45.8 kg   SpO2 100%   BMI 17.89 kg/m  Physical Exam Vitals and nursing note reviewed.  Constitutional:      Appearance: She is well-developed.  HENT:     Head: Normocephalic.     Comments: Patient appears to have facial edema Cardiovascular:     Rate and Rhythm: Normal rate.  Pulmonary:     Effort: Pulmonary effort is  normal.     Breath sounds: Normal breath sounds. No decreased breath sounds.  Abdominal:     General: There is no distension.  Musculoskeletal:        General: Normal range of motion.     Cervical back: Normal range of motion.     Right lower leg: Edema present.     Left lower leg: Edema present.     Comments: No pitting, minimal edema  Skin:    General: Skin is warm.  Neurological:     General: No focal deficit present.     Mental Status: She is alert and oriented to person, place, and time.     ED Results / Procedures / Treatments   Labs (all labs ordered are listed, but only abnormal results are displayed) Labs Reviewed  CBC WITH DIFFERENTIAL/PLATELET  COMPREHENSIVE METABOLIC PANEL  BRAIN NATRIURETIC PEPTIDE  TROPONIN I (HIGH SENSITIVITY)    EKG None  Radiology No results found.  Procedures Procedures    Medications Ordered in ED Medications - No data to display  ED Course/ Medical Decision Making/ A&P                                 Medical Decision Making Complains of increased shortness of breath swelling in extremities and weakness.  Patient has a past medical history of congestive heart failure.  Patient reports that she has been getting shots of Lasix daily however she has continued to have fluid buildup  Amount and/or Complexity of Data Reviewed Independent Historian:     Details: Pt  is here with a family member who is supportive External Data Reviewed: notes.    Details: Primary care notes reviewed Congestive heart failure clinics notes reviewed Labs: ordered. Decision-making details documented in ED Course.    Details: Labs ordered reviewed and interpreted patient's BNP is greater than 4500.  BUN is 66 creatinine is 3.45. Radiology: ordered and independent interpretation performed. Decision-making details documented in ED Course.    Details: X-ray shows pleural effusion and interstitial edema ECG/medicine tests: ordered and independent  interpretation performed. Decision-making details documented in ED Course.    Details: EKG shows sinus rhythm  no acure Discussion of management or test interpretation with external provider(s): Cardiology consulted.  They will see and evaluate patient Hospitalist consulted for admission.  Risk Prescription drug management. Risk Details: Patient given IV Lasix.          Final Clinical Impression(s) / ED Diagnoses Final diagnoses:  Acute congestive heart failure, unspecified heart failure type [facility])    Rx / DC Orders ED Discharge Orders     None         Thorndale,  Lonia Skinner, PA-C 07/09/23 1244    Elson Areas, PA-C 07/09/23 1255    Rondel Baton, MD 07/09/23 1816

## 2023-07-09 NOTE — Progress Notes (Signed)
PT Cancellation Note  Patient Details Name: Tracy Nelson MRN: [id number] DOB: [birth year]   Cancelled Treatment:    Reason Eval/Treat Not Completed: Other (comment) made multiple attempts to see pt, with other clinical staff and not available on each attempt. Will check back if time/schedule allow.   Nedra Hai, PT, DPT 07/09/23 3:26 PM

## 2023-07-09 NOTE — ED Notes (Addendum)
ED TO INPATIENT HANDOFF REPORT  ED Nurse Name and Phone #: Aggie Cosier [phone]  S Name/Age/Gender Tracy Nelson 77 y.o. female Room/Bed: 033C/033C  Code Status   Code Status: Full Code  Home/SNF/Other Home Patient oriented to: self, place, time, and situation Is this baseline? Yes   Triage Complete: Triage complete  Chief Complaint CHF exacerbation (HCC) [I50.9]  Triage Note Pt c.o sob and bilateral lower leg edema. Pt has tried 3 SQ lasix doses within the last week without much relief. Last dose was Wednesday. Pt denies chest pain   Allergies Allergies  Allergen Reactions   Baclofen Nausea And Vomiting   Penicillins Rash    Level of Care/Admitting Diagnosis ED Disposition     ED Disposition  Admit   Condition  --   Comment  Hospital Area: [facility] [[id number]]  Level of Care: Telemetry Cardiac [103]  May place patient in observation at [facility] or [facility] if equivalent level of care is available:: Yes  Covid Evaluation: Confirmed COVID Negative  Diagnosis: CHF exacerbation [facility]) [[id number]]  Admitting Physician: Sharlene Dory [[id number]]  Attending Physician: Sharlene Dory [[id number]]          B Medical/Surgery History Past Medical History:  Diagnosis Date   Acid reflux 07/15/2020   Aortic insufficiency    Arthritis 07/15/2020   Atrial flutter (HCC)    Colitis 07/15/2020   Dilated cardiomyopathy (HCC) 07/15/2020   HFrEF (heart failure with reduced ejection fraction) (HCC) 07/15/2020   HTN (hypertension) 07/15/2020   Mitral regurgitation    Osteoporosis 07/15/2020   PAF (paroxysmal atrial fibrillation) (HCC)    Palpitations 07/15/2020   Rheumatoid arthritis (HCC) 07/15/2020   Tricuspid regurgitation    Past Surgical History:  Procedure Laterality Date   CARDIOVERSION N/A 11/08/2020   Procedure: CARDIOVERSION;  Surgeon: Laurey Morale, MD;  Location: [facility] ENDOSCOPY;  Service: Cardiovascular;  Laterality:  N/A;   HIP FRACTURE SURGERY     INTRAMEDULLARY (IM) NAIL INTERTROCHANTERIC Left 07/10/2022   Procedure: INTRAMEDULLARY (IM) NAIL INTERTROCHANTERIC;  Surgeon: Tarry Kos, MD;  Location: [facility] OR;  Service: Orthopedics;  Laterality: Left;   RIGHT HEART CATH N/A 08/14/2020   Procedure: RIGHT HEART CATH;  Surgeon: Laurey Morale, MD;  Location: [facility] INVASIVE CV LAB;  Service: Cardiovascular;  Laterality: N/A;   RIGHT HEART CATH N/A 09/03/2022   Procedure: RIGHT HEART CATH;  Surgeon: Laurey Morale, MD;  Location: [facility] INVASIVE CV LAB;  Service: Cardiovascular;  Laterality: N/A;   TEE WITHOUT CARDIOVERSION N/A 11/08/2020   Procedure: TRANSESOPHAGEAL ECHOCARDIOGRAM (TEE);  Surgeon: Laurey Morale, MD;  Location: [facility] ENDOSCOPY;  Service: Cardiovascular;  Laterality: N/A;     A IV Location/Drains/Wounds Patient Lines/Drains/Airways Status     Active Line/Drains/Airways     Name Placement date Placement time Site Days   Peripheral IV 07/09/23 20 G Anterior;Left Forearm 07/09/23  1053  Forearm  less than 1   Wound / Incision (Open or Dehisced) 08/31/22 Other (Comment) Heel Right 08/31/22  0130  Heel  312            Intake/Output Last 24 hours No intake or output data in the 24 hours ending 07/09/23 1406  Labs/Imaging Results for orders placed or performed during the hospital encounter of 07/09/23 (from the past 48 hour(s))  CBC with Differential     Status: Abnormal   Collection Time: 07/09/23 10:45 AM  Result Value Ref Range   WBC 9.1 4.0 - 10.5 K/uL  RBC 3.51 (L) 3.87 - 5.11 MIL/uL   Hemoglobin 10.1 (L) 12.0 - 15.0 g/dL   HCT 13.0 (L) 86.5 - 78.4 %   MCV 96.3 80.0 - 100.0 fL   MCH 28.8 26.0 - 34.0 pg   MCHC 29.9 (L) 30.0 - 36.0 g/dL   RDW 69.6 (H) 29.5 - 28.4 %   Platelets 161 150 - 400 K/uL   nRBC 0.0 0.0 - 0.2 %   Neutrophils Relative % 86 %   Neutro Abs 7.8 (H) 1.7 - 7.7 K/uL   Lymphocytes Relative 8 %   Lymphs Abs 0.8 0.7 - 4.0 K/uL   Monocytes Relative 5 %    Monocytes Absolute 0.5 0.1 - 1.0 K/uL   Eosinophils Relative 0 %   Eosinophils Absolute 0.0 0.0 - 0.5 K/uL   Basophils Relative 0 %   Basophils Absolute 0.0 0.0 - 0.1 K/uL   Immature Granulocytes 1 %   Abs Immature Granulocytes 0.06 0.00 - 0.07 K/uL    Comment: Performed at [facility] Lab, [address] [place], [place] [address]  Comprehensive metabolic panel     Status: Abnormal   Collection Time: 07/09/23 10:45 AM  Result Value Ref Range   Sodium 141 135 - 145 mmol/L   Potassium 4.5 3.5 - 5.1 mmol/L   Chloride 111 98 - 111 mmol/L   CO2 21 (L) 22 - 32 mmol/L   Glucose, Bld 151 (H) 70 - 99 mg/dL    Comment: Glucose reference range applies only to samples taken after fasting for at least 8 hours.   BUN 66 (H) 8 - 23 mg/dL   Creatinine, Ser 0.10 (H) 0.44 - 1.00 mg/dL   Calcium 9.7 8.9 - 27.2 mg/dL   Total Protein 5.8 (L) 6.5 - 8.1 g/dL   Albumin 2.9 (L) 3.5 - 5.0 g/dL   AST 20 15 - 41 U/L   ALT 24 0 - 44 U/L   Alkaline Phosphatase 77 38 - 126 U/L   Total Bilirubin 0.7 <1.2 mg/dL   GFR, Estimated 13 (L) >60 mL/min    Comment: (NOTE) Calculated using the CKD-EPI Creatinine Equation (2021)    Anion gap 9 5 - 15    Comment: Performed at [facility] Lab, [address] [place], [place] [address]  Troponin I (High Sensitivity)     Status: Abnormal   Collection Time: 07/09/23 10:45 AM  Result Value Ref Range   Troponin I (High Sensitivity) 32 (H) <18 ng/L    Comment: (NOTE) Elevated high sensitivity troponin I (hsTnI) values and significant  changes across serial measurements may suggest ACS but many other  chronic and acute conditions are known to elevate hsTnI results.  Refer to the "Links" section for chest pain algorithms and additional  guidance. Performed at [facility] Lab, [address] [place], [place] [address]   Brain natriuretic peptide     Status: Abnormal   Collection Time: 07/09/23 10:45 AM  Result Value Ref Range   B Natriuretic Peptide >4,500.0  (H) 0.0 - 100.0 pg/mL    Comment: Performed at [facility] Lab, [address], [place], [place] [address]  Troponin I (High Sensitivity)     Status: Abnormal   Collection Time: 07/09/23 12:40 PM  Result Value Ref Range   Troponin I (High Sensitivity) 32 (H) <18 ng/L    Comment: (NOTE) Elevated high sensitivity troponin I (hsTnI) values and significant  changes across serial measurements may suggest ACS but many other  chronic and acute conditions are known to elevate hsTnI results.  Refer to the "Links" section for chest pain algorithms and additional  guidance. Performed at [facility], [address], [place], [place] [address]    DG Chest Port 1 View  Result Date: 07/09/2023 CLINICAL DATA:  Short of breath.  Leg swelling.  Fever. EXAM: PORTABLE CHEST 1 VIEW COMPARISON:  11/04/2022 and older studies. FINDINGS: Moderate enlargement of the cardiac silhouette, stable. No mediastinal or hilar masses. Opacity at the left lung base obscures the hemidiaphragm, similar to the prior exam. Mild prominence of the interstitial markings also stable from prior study. Remainder of the lungs is clear. Possible left pleural effusion. No pneumothorax. Skeletal structures are grossly intact. IMPRESSION: 1. Persistent opacity the left lower lung compared to the exam from 11/04/2022. This is suspected to be a combination of pleural fluid and atelectasis. Pneumonia should be considered, however, given the patient's current symptoms. 2. Stable cardiomegaly.  Possible mild interstitial edema/CHF. Electronically Signed   By: Amie Portland M.D.   On: 07/09/2023 10:56    Pending Labs Unresulted Labs (From admission, onward)     Start     Ordered   07/10/23 0500  Basic metabolic panel  Tomorrow morning,   R       Comments: As Scheduled for 5 days    07/09/23 1402   07/10/23 0500  CBC with Differential/Platelet  Tomorrow morning,   R        07/09/23 1402            Vitals/Pain Today's Vitals    07/09/23 0943 07/09/23 0944 07/09/23 1100 07/09/23 1130  BP:  103/72 103/79 111/79  Pulse:  85 86 86  Resp:  20 19 19   Temp:  98.2 F (36.8 C)    TempSrc:  Oral    SpO2:  100% 100% 100%  Weight: 101 lb (45.8 kg)     Height: 5\' 3"  (1.6 m)     PainSc: 0-No pain       Isolation Precautions No active isolations  Medications Medications  sodium chloride flush (NS) 0.9 % injection 3 mL (has no administration in time range)  sodium chloride flush (NS) 0.9 % injection 3 mL (has no administration in time range)  0.9 %  sodium chloride infusion (has no administration in time range)  ondansetron (ZOFRAN) injection 4 mg (has no administration in time range)  acetaminophen (TYLENOL) tablet 500 mg (has no administration in time range)  amiodarone (PACERONE) tablet 200 mg (has no administration in time range)  calcitRIOL (ROCALTROL) capsule 0.5 mcg (has no administration in time range)  folic acid (FOLVITE) tablet 1 mg (has no administration in time range)  apixaban (ELIQUIS) tablet 2.5 mg (has no administration in time range)  pantoprazole (PROTONIX) EC tablet 40 mg (has no administration in time range)  potassium chloride (KLOR-CON M) CR tablet 10 mEq (has no administration in time range)  predniSONE (DELTASONE) tablet 5 mg (has no administration in time range)  sodium bicarbonate tablet 650 mg (has no administration in time range)  furosemide (LASIX) injection 40 mg (40 mg Intravenous Given 07/09/23 1239)    Mobility walks     Focused Assessments Pulmonary Assessment Handoff:  Lung sounds: Bilateral Breath Sounds: Rales, Diminished O2 Device: Room Air      R Recommendations: See Admitting Provider Note  Report given to:   Additional Notes:   Pt walks with device

## 2023-07-09 NOTE — Consult Note (Addendum)
Cardiology Consultation   Patient ID: FEIGE KRUTZ MRN: [id number]; DOB: 1945-10-03  Admit date: 07/09/2023 Date of Consult: 07/09/2023  PCP:  [name], [name]   [facility] Providers Cardiologist:  Follows with [name] with AHF Click here to update MD or APP on Care Team, Refresh:1}     Patient Profile:   Tracy Nelson is a 77 y.o. 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  History of Present Illness:   Tracy Nelson has very complex history of HF. Per notes, she has been known to have a cardiomyopathy since 2015. Cardiac MRI in 2015 showed EF 37% with no LGE. Coronary angiography at that time was reported to have shown showed no significant coronary disease. Over the next few years, LVEF fluctuated up and down. In 06/2020 she was admitted in [place], [place] with CHF, EF 20-25%, and elevated creatinine. She was diuresed and sent home, but was readmitted later in 06/2020 with ongoing volume overload and AKI. She was taken off most of her cardiac meds due to soft BP and elevated creatinine. She was discharged to her daughter's house in [place] and established with [name] in 07/2020. EF here remained <20% and RHC 08/2020 surprisingly showed low filling pressures and relatively preserved cardiac output. She was found to have AF in 10/2020 and converted to NSR on amiodarone. She had atrial flutter to follow and underwent TEE/DCCV in 11/2020. CPX in 10/2020 showed severe HF limitation. PYP scan 2022 was equivocal with repeat 10/2021 felt to be likely negative per Dr. Alford Highland interpretation. Cardiac MRI in 05/2021 showed mild LV  dilation with EF 23%, normal RV size with RVEF 37%, ECV 34%, small area of subendocardial LGE in the mid inferolateral wall and small area of full thickness LGE in the basal  inferior wall.  She also saw hematology remotely in 2022 for M spike of unclear significance to be followed going forward; they felt AL amyloid unlikely at that time. She was previously evaluated for [facility] or ANTHEM, BNP too high and thought to be too frail. She has had numerous subsequent complex admissions - 06/2022 with hip fracture complicated by AKI (EF 45-50%), 08/2022 with CHF/AKI requiring milrinone felt to be end stage HF (EF back down to <20%), 10/2021 with symptomatic anemia Hgb 6.8 requiring transfusion. During the latter, GI recommended conservative management. FOBT was negative without signs of blood loss and GI did not feel endoscopic evaluation was warranted. She was started on PPI with improvement of dyspepsia. Last echo 10/2022 showed EF 20-25% with global HK, mildly dilated LV, severely reduced RV function, mild RVE, severe BAE, moderate MR, moderate-severe TR, mild-moderate AI. She was back in AFib during clinic visit 05/27/23 - amiodarone increased to 200 mg bid x 1 week, arranged for TEE (had missed a few doses of Eliquis) and DCCV. She ultimately chemically converted to NSR and TEE/DCCV deferred. Re: GDMT, Verquvo previously stopped due to hypotension, off dapagliflozin stopped due to rise in SCr/hypotension, off spironolactone due to CKD/hypotension. She does not have an ICD and placement of such was not recommended given end stage HF. She was previously referred for palliative care and was not ready for hospice.   More  recently she has been following in November for worsening SOB and edema with recent OV 07/01/22. She had had her Bumex increased as outpatient without improvement. She was instructed to use Furoscix kit daily x 2 days then restart Bumex at 1.5mg  daily along with potassium replacement. She was felt high  risk for hospitalization. She reports adherence with meds. Despite trial of outpatient med adjustment, she continued to feel poorly with increased SOB, LE edema, orthopnea so presented to the ED. She reports weight has remained around 101lb at home. She'd also had some pain in her back. She has had rare chest pinches but no anginal chest pain. She says she initially had good UOP with the Furoscix but as soon as she went back to oral Bumex it dropped off. Labs show continued BNP>4500 (similar to recent values), Cr 3.45 (progressive from earlier 2024 but similar to 10-06/2023), albumin 2.9, Hgb 10.1 (stable compared to 05/2023), plt OK, hsTroponin 32->32. CXR shows persistent opacity in the L lower lung suspected pleural fluid/atx (cannot exclude PNA), possible mild interstitial edema/CHF.  She received 40mg  IV Lasix and reports good UOP x2 thus far.   Past Medical History:  Diagnosis Date   Acid reflux 07/15/2020   Aortic insufficiency    Arthritis 07/15/2020   Atrial flutter (HCC)    Colitis 07/15/2020   Dilated cardiomyopathy (HCC) 07/15/2020   HFrEF (heart failure with reduced ejection fraction) (HCC) 07/15/2020   HTN (hypertension) 07/15/2020   Mitral regurgitation    Osteoporosis 07/15/2020   PAF (paroxysmal atrial fibrillation) (HCC)    Palpitations 07/15/2020   Rheumatoid arthritis (HCC) 07/15/2020   Tricuspid regurgitation     Past Surgical History:  Procedure Laterality Date   CARDIOVERSION N/A 11/08/2020   Procedure: CARDIOVERSION;  Surgeon: Laurey Morale, MD;  Location: [facility] ENDOSCOPY;  Service: Cardiovascular;  Laterality: N/A;   HIP FRACTURE SURGERY     INTRAMEDULLARY (IM) NAIL INTERTROCHANTERIC Left 07/10/2022   Procedure: INTRAMEDULLARY (IM) NAIL INTERTROCHANTERIC;  Surgeon: Tarry Kos, MD;  Location: [facility] OR;  Service: Orthopedics;  Laterality: Left;   RIGHT HEART CATH N/A 08/14/2020   Procedure: RIGHT HEART CATH;  Surgeon: Laurey Morale, MD;  Location[facility] INVASIVE  CV LAB;  Service: Cardiovascular;  Laterality: N/A;   RIGHT HEART CATH N/A 09/03/2022   Procedure: RIGHT HEART CATH;  Surgeon: Laurey Morale, MD;  Location: [facility] INVASIVE CV LAB;  Service: Cardiovascular;  Laterality: N/A;   TEE WITHOUT CARDIOVERSION N/A 11/08/2020   Procedure: TRANSESOPHAGEAL ECHOCARDIOGRAM (TEE);  Surgeon: Laurey Morale, MD;  Location: [facility] ENDOSCOPY;  Service: Cardiovascular;  Laterality: N/A;     Home Medications:  Prior to Admission medications   Medication Sig Start Date End Date Taking? Authorizing Provider  acetaminophen (TYLENOL) 500 MG tablet Take 500 mg by mouth every 6 (six) hours as needed for moderate pain (pain score 4-6).    [provider]  amiodarone (PACERONE) 200 MG tablet Take 1 tablet (200 mg total) by mouth 2 (two) times daily for 7 days, THEN 1 tablet (200 mg total) daily. Patient taking differently: Take 200 mg by mouth once daily 05/27/23 05/21/24  Laurey Morale, MD  bumetanide (BUMEX) 1 MG tablet Take 2 tablets (2 mg total) by mouth daily for 3 days, THEN 1.5 tablets (1.5 mg total) daily. Patient taking differently: Patient takes 2 tablets by mouth twice a a day. 06/29/23 07/01/24  Jacklynn Ganong, [name]  calcitRIOL (ROCALTROL) 0.25 MCG capsule Take 2 capsules by mouth daily. 06/25/22  [provider]  ELIQUIS 2.5 MG TABS tablet TAKE 1 TABLET BY MOUTH TWICE A DAY 10/05/22   Laurey Morale, MD  Ensure (ENSURE) Take 237 mLs by mouth every other day.    [provider]  folic acid (FOLVITE) 1 MG tablet TAKE 1 TABLET BY MOUTH EVERY DAY 07/22/22   Laurey Morale, MD  hydroxypropyl methylcellulose / hypromellose (ISOPTO TEARS / GONIOVISC) 2.5 % ophthalmic solution Place 1 drop into both eyes as needed for dry eyes.    [provider]  methocarbamol (ROBAXIN) 500 MG tablet Take 500 mg by mouth 2 (two) times daily as needed for muscle spasms.    [provider]  Multiple Vitamin (MULTIVITAMIN) tablet Take  1 tablet by mouth 2 (two) times a week.    [provider]  omeprazole (PRILOSEC) 40 MG capsule TAKE 1 CAPSULE (40 MG TOTAL) BY MOUTH DAILY AS NEEDED (FOR ACID REFLUX). 04/13/23   [name], [name]  potassium chloride SA (KLOR-CON M) 10 MEQ tablet Take 1 tablet (10 mEq total) by mouth daily. 06/29/23   Milford, [name], [name]  predniSONE (DELTASONE) 5 MG tablet Take 1 tablet (5 mg total) by mouth daily with breakfast. 03/30/23   Rice, Jamesetta Orleans, MD  sodium bicarbonate 650 MG tablet Take 650 mg by mouth 2 (two) times daily. 02/23/23   [provider]    Inpatient Medications: Scheduled Meds:  amiodarone  200 mg Oral BID   apixaban  2.5 mg Oral BID   calcitRIOL  0.5 mcg Oral Daily   folic acid  1 mg Oral Daily   pantoprazole  40 mg Oral Daily   potassium chloride  10 mEq Oral Daily   [START ON 07/10/2023] predniSONE  5 mg Oral Q breakfast   sodium bicarbonate  650 mg Oral BID   sodium chloride flush  3 mL Intravenous Q12H   Continuous Infusions:  sodium chloride     PRN Meds: sodium chloride, acetaminophen, ondansetron (ZOFRAN) IV, sodium chloride flush  Allergies:    Allergies  Allergen Reactions   Baclofen Nausea And Vomiting   Penicillins Rash    Social History:   Social History   Socioeconomic History   Marital status: Widowed    Spouse name: Not on file   Number of children: Not on file   Years of education: 16   Highest education level: Bachelor's degree (e.g., BA, AB, BS)  Occupational History   Occupation: Retired  Tobacco Use   Smoking status: Never    Passive exposure: Past   Smokeless tobacco: Never  Vaping Use   Vaping status: Never Used  Substance and Sexual Activity   Alcohol use: Never   Drug use: Never   Sexual activity: Not Currently  Other Topics Concern   Not on file  Social History Narrative   Not on file   Social Determinants of Health   Financial Resource Strain: Low Risk  (06/03/2023)   Overall Financial Resource  Strain (CARDIA)    Difficulty of Paying Living Expenses: Not hard at all  Food Insecurity: No Food Insecurity (07/09/2023)   Hunger Vital Sign    Worried About Running Out of Food in the Last Year: Never true    Ran Out of Food in the Last Year: Never true  Transportation Needs: Unmet Transportation Needs (07/09/2023)   PRAPARE - Administrator, Civil Service (Medical): Yes    Lack of Transportation (Non-Medical): No  Physical Activity: Inactive (06/03/2023)   Exercise  Vital Sign    Days of Exercise per Week: 0 days    Minutes of Exercise per Session: 0 min  Stress: No Stress Concern Present (06/03/2023)   [employer] of Occupational Health - Occupational Stress Questionnaire    Feeling of Stress : Only a little  Social Connections: Moderately Isolated (06/03/2023)   Social Connection and Isolation Panel [NHANES]    Frequency of Communication with Friends and Family: More than three times a week    Frequency of Social Gatherings with Friends and Family: Never    Attends Religious Services: More than 4 times per year    Active Member of [employer] or Organizations: No    Attends Banker Meetings: Never    Marital Status: Widowed  Intimate Partner Violence: Not At Risk (07/09/2023)   Humiliation, Afraid, Rape, and Kick questionnaire    Fear of Current or Ex-Partner: No    Emotionally Abused: No    Physically Abused: No    Sexually Abused: No    Family History:   Family History  Problem Relation Age of Onset   Hypertension Mother    Diabetes Mother    Hypertension Father    Diabetes Father    Breast cancer Maternal Aunt    Breast cancer Paternal Aunt    Arthritis Maternal Grandmother    Lung disease Paternal Grandfather    Cancer Brother      ROS:  Please see the history of present illness.  All other ROS reviewed and negative.     Physical Exam/Data:   Vitals:   07/09/23 0943 07/09/23 0944 07/09/23 1100 07/09/23 1130  BP:  103/72 103/79  111/79  Pulse:  85 86 86  Resp:  20 19 19   Temp:  98.2 F (36.8 C)    TempSrc:  Oral    SpO2:  100% 100% 100%  Weight: 45.8 kg     Height: 5\' 3"  (1.6 m)      No intake or output data in the 24 hours ending 07/09/23 1405    07/09/2023    9:43 AM 07/02/2023    1:39 PM 06/28/2023   11:05 AM  Last 3 Weights  Weight (lbs) 101 lb 113 lb 6.4 oz 103 lb  Weight (kg) 45.813 kg 51.438 kg 46.72 kg     Body mass index is 17.89 kg/m.  General: Frail AAF in no acute distress. Head: Normocephalic, atraumatic, sclera non-icteric, no xanthomas, nares are without discharge. Neck: Negative for carotid bruits. JVP elevated. Lungs: Decreased BS at bases otherwise clear without wheezes, rales, or rhonchi. Breathing is unlabored. Heart: RRR S1 S2 without murmurs, rubs, or gallops.  Abdomen: Soft, non-tender, non-distended with normoactive bowel sounds. No rebound/guarding. Extremities: No clubbing or cyanosis. 2+ pitting BLE edema superimposed on frail leg habitus. Distal pedal pulses are 2+ and equal bilaterally. Neuro: Alert and oriented X 3. Moves all extremities spontaneously. Psych:  Responds to questions appropriately with a normal affect.   EKG:  The EKG was personally reviewed and demonstrates:  NSR 86bpm, first degree AVB, possible LAE, old anterior infarct, NSIVCD QRS duration , nonspecific TWI avL Telemetry:  Telemetry was personally reviewed and demonstrates:  NSR, occ PVCs multifocal, rare beat short runs NSVT up to 5 beats  Relevant CV Studies: 2d echo 10/2022   1. Left ventricular ejection fraction, by estimation, is 20 to 25%. The  left ventricle has severely decreased function. The left ventricle  demonstrates global hypokinesis. The left ventricular internal cavity size  was mildly  dilated. Left ventricular  diastolic function could not be evaluated. Elevated left ventricular  end-diastolic pressure.   2. Right ventricular systolic function is severely reduced. The right   ventricular size is mildly enlarged. There is normal pulmonary artery  systolic pressure.   3. Left atrial size was severely dilated.   4. Right atrial size was severely dilated.   5. The mitral valve is degenerative. Moderate mitral valve regurgitation.  No evidence of mitral stenosis.   6. Tricuspid valve regurgitation is moderate to severe.   7. The aortic valve is calcified. Aortic valve regurgitation is mild to  moderate. Aortic valve sclerosis/calcification is present, without any  evidence of aortic stenosis.   8. Abdominal aorta is normal size.   9. The inferior vena cava is normal in size with greater than 50%  respiratory variability, suggesting right atrial pressure of 3 mmHg.   Comparison(s): A prior study was performed on 08/2022. No significant  change from prior study.   RHC 08/2022  1. Good cardiac output on milrinone.  2. Mildly elevated right and left heart filling pressures with mild pulmonary venous hypertension.    Continue milrinone for now to see if we can get her renal function back to her baseline.  Will add back oral diuretic tomorrow.  Long-term, poor candidate for advanced therapies given frailty/deconditioning and renal dysfunction.  Would consider her for palliative home milrinone if unable to successfully wean off this admission.    cMRI 2022  EXAM: CARDIAC MRI   TECHNIQUE: The patient was scanned on a 1.5 Tesla GE magnet. A dedicated cardiac coil was used. Functional imaging was done using Fiesta sequences. 2,3, and 4 chamber views were done to assess for RWMA's. Modified Simpson's rule using a short axis stack was used to calculate an ejection fraction on a dedicated work Research officer, trade union. The patient received 6 cc of Gadavist. After 10 minutes inversion recovery sequences were used to assess for infiltration and scar tissue.   CONTRAST:  Gadavist 6 cc   FINDINGS: Limited images of the lung fields showed no gross  abnormalities.   Mildly dilated left ventricle with normal wall thickness. Diffuse hypokinesis with EF 23%. No LV thrombus noted. Normal right ventricular size with mildly decreased systolic function, EF 37%. Normal left atrial size. Mildly dilated right atrium. Tricuspid aortic valve with no significant stenosis, mild aortic insufficiency. Mild to moderate mitral regurgitation.   Delayed enhancement   Small area of subendocardial late gadolinium enhancement, around 50% wall thickness, in the mid inferolateral wall.   Small area of basal inferior near-full thickness LGE.   MEASUREMENTS: MEASUREMENTS LVEDV 218 mL   LVSV 50 mL   LVEF 23%   RVEDV 153 mL RVSV 56 mL RVEF 37%   ECV 34% in septum, T1 1068   IMPRESSION: 1. Mildly dilated LV with EF 23%, diffuse hypokinesis. Normal wall thickness.   2.  Normal RV size with mildly decreased systolic function, EF 37%.   3. LGE pattern with small area of subendocardial LGE in the mid inferolateral wall and small area of near full thickness LGE in the basal inferior wall. This does not appear consistent with cardiac amyloidosis (and ECV only 34% - mildly abnormal). Consider prior myocarditis, cannot rule out embolic event down coronary artery but no LV thrombus visualized. Cannot fully rule out cardiac sarcoidosis.   Dalton Mclean     Electronically Signed   By: Marca Ancona M.D.   On: 05/23/2021 09:58  PYP 10/2021 IMPRESSION:  Visual and quantitative assessment (grade 1, H/CL = 1.30) is equivocal for transthyretin amyloidosis.     Electronically Signed   By: Ulyses Southward M.D.   On: 10/20/2021 15:33      Laboratory Data:  High Sensitivity Troponin:   Recent Labs  Lab 07/09/23 1045 07/09/23 1240  TROPONINIHS 32* 32*     Chemistry Recent Labs  Lab 07/02/23 1415 07/07/23 1142 07/09/23 1045  NA 140 141 141  K 4.5 4.0 4.5  CL 110 111 111  CO2 20* 20* 21*  GLUCOSE 93 132* 151*  BUN 67* 70* 66*   CREATININE 3.12* 3.50* 3.45*  CALCIUM 9.3 9.4 9.7  MG 2.4  --   --   GFRNONAA 15* 13* 13*  ANIONGAP 10 10 9     Recent Labs  Lab 07/09/23 1045  PROT 5.8*  ALBUMIN 2.9*  AST 20  ALT 24  ALKPHOS 77  BILITOT 0.7   Lipids No results for input(s): "CHOL", "TRIG", "HDL", "LABVLDL", "LDLCALC", "CHOLHDL" in the last 168 hours.  Hematology Recent Labs  Lab 07/09/23 1045  WBC 9.1  RBC 3.51*  HGB 10.1*  HCT 33.8*  MCV 96.3  MCH 28.8  MCHC 29.9*  RDW 17.4*  PLT 161   Thyroid No results for input(s): "TSH", "FREET4" in the last 168 hours.  BNP Recent Labs  Lab 07/02/23 1415 07/07/23 1142 07/09/23 1045  BNP >4,500.0* >4,500.0* >4,500.0*    DDimer No results for input(s): "DDIMER" in the last 168 hours.   Radiology/Studies:  DG Chest Port 1 View  Result Date: 07/09/2023 CLINICAL DATA:  Short of breath.  Leg swelling.  Fever. EXAM: PORTABLE CHEST 1 VIEW COMPARISON:  11/04/2022 and older studies. FINDINGS: Moderate enlargement of the cardiac silhouette, stable. No mediastinal or hilar masses. Opacity at the left lung base obscures the hemidiaphragm, similar to the prior exam. Mild prominence of the interstitial markings also stable from prior study. Remainder of the lungs is clear. Possible left pleural effusion. No pneumothorax. Skeletal structures are grossly intact. IMPRESSION: 1. Persistent opacity the left lower lung compared to the exam from 11/04/2022. This is suspected to be a combination of pleural fluid and atelectasis. Pneumonia should be considered, however, given the patient's current symptoms. 2. Stable cardiomegaly.  Possible mild interstitial edema/CHF. Electronically Signed   By: Amie Portland M.D.   On: 07/09/2023 10:56     Assessment and Plan:   1. Acute on chronic biventricular HFrEF - previously felt to have end stage HF, complicated by advanced kidney disease - cardmaster reviewed with Dr. Gasper Lloyd who recommended general cards to see as she was not felt  to be a candidate for advanced therapies. Per extensive note review, their team has been concerned about GOC per notes in the past and previously discussed referral to palliative care - GDMT has been limited by CKD, hypotension - per discussion with Dr. Tereso Newcomer, will check lactate now to help guide need for PICC/Co-Ox (keeping in mind advanced CKD), she suggests continuing IV Lasix 40mg  BID (next dose tonight) - hsTroponins low/flat 32-32 likely demand ischemia, do not feel this represents ACS - strict I/O's, daily weights  2. CKD stage 5 - low threshold to involve nephrology, consider consultation  3. Moderate MR, moderate-severe TR, mild-moderate AI by echo 10/2022 - follow clinically  4. Chronic anemia, prior suspected MGUS - saw heme-onc once in 2022 for elevated M spike not in myeloma range, cMRI without AL amyloid at that time - Hgb stable compared to prior  5. Paroxysmal atrial fib/flutter - maintaining NSR - continue amiodarone 200mg  once daily (had burst dosing in 05/2023 for recurrence with subsequent spontaneous reversion back to NSR at that time, subsequently back down to 200mg  daily - continue Eliquis 2.5mg  BID - add TSH to labs  6. PVCs, NSVT (max 5 beats) - recent K, Mg OK - continue amiodarone   7. Back pain - per primary team  Risk Assessment/Risk Scores:        New York Heart Association (NYHA) Functional Class NYHA Class IV  CHA2DS2-VASc Score = 5   This indicates a 7.2% annual risk of stroke. The patient's score is based upon: CHF History: 1 HTN History: 0 Diabetes History: 0 Stroke History: 0 Vascular Disease History: 1 (carotid atherosclerosis on CT) Age Score: 2 Gender Score: 1      For questions or updates, please contact [facility] Please consult [email or web address] for contact info under    Signed, Laurann Montana, PA-C  07/09/2023 2:05 PM

## 2023-07-09 NOTE — H&P (Addendum)
History and Physical    Tracy Nelson:[id number] DOB: 1945/09/03 DOA: 07/09/2023  PCP: [name], [name]  Patient coming from: Home  Chief Complaint: SOB  HPI: Tracy Nelson is a 77 y.o. female with medical history significant of HFrEF ~20%, CKD stage IV following with [facility], rheumatoid arthritis/Sjogren's syndrome following with rheumatology, GERD, atrial flutter/fibrillation who presents for shortness of breath.  Over the past 2 weeks, the patient reports shortness of breath.  She is seeing the heart failure clinic on an outpatient basis.  She has a history of atrial fibrillation converting on amiodarone and then having atrial flutter following this.  She was on furosemide injections followed by Bumex for diuresis.  This did help with swelling in her legs and decrease her weight, but did not help with her breathing.  She is not coughing or wheezing.  She reports compliance with her medication.  She denies any deviation from her diet including higher sodium foods.  She denies any chest pain or palpitations.  ED Course: Creatinine around new baseline at 3.45, BUN 66, GFR 13.  Given an IV dose of Lasix 40 mg.  Possible edema noted on chest x-ray.  BNP greater than 4500. Cardiology consulted.  Review of Systems: As per HPI otherwise 10 point review of systems negative.   Past Medical History:  Diagnosis Date   Acid reflux 07/15/2020   Aortic insufficiency    Arthritis 07/15/2020   Atrial flutter (HCC)    Colitis 07/15/2020   Dilated cardiomyopathy (HCC) 07/15/2020   HFrEF (heart failure with reduced ejection fraction) (HCC) 07/15/2020   HTN (hypertension) 07/15/2020   Mitral regurgitation    Osteoporosis 07/15/2020   PAF (paroxysmal atrial fibrillation) (HCC)    Palpitations 07/15/2020   Rheumatoid arthritis (HCC) 07/15/2020   Tricuspid regurgitation     Past Surgical History:  Procedure Laterality Date   CARDIOVERSION N/A 11/08/2020   Procedure:  CARDIOVERSION;  Surgeon: Laurey Morale, MD;  Location: [facility] ENDOSCOPY;  Service: Cardiovascular;  Laterality: N/A;   HIP FRACTURE SURGERY     INTRAMEDULLARY (IM) NAIL INTERTROCHANTERIC Left 07/10/2022   Procedure: INTRAMEDULLARY (IM) NAIL INTERTROCHANTERIC;  Surgeon: Tarry Kos, MD;  Location: [facility] OR;  Service: Orthopedics;  Laterality: Left;   RIGHT HEART CATH N/A 08/14/2020   Procedure: RIGHT HEART CATH;  Surgeon: Laurey Morale, MD;  Location: [facility] INVASIVE CV LAB;  Service: Cardiovascular;  Laterality: N/A;   RIGHT HEART CATH N/A 09/03/2022   Procedure: RIGHT HEART CATH;  Surgeon: Laurey Morale, MD;  Location: [facility] INVASIVE CV LAB;  Service: Cardiovascular;  Laterality: N/A;   TEE WITHOUT CARDIOVERSION N/A 11/08/2020   Procedure: TRANSESOPHAGEAL ECHOCARDIOGRAM (TEE);  Surgeon: Laurey Morale, MD;  Location: [facility] ENDOSCOPY;  Service: Cardiovascular;  Laterality: N/A;     reports that she has never smoked. She has been exposed to tobacco smoke. She has never used smokeless tobacco. She reports that she does not drink alcohol and does not use drugs.  Allergies  Allergen Reactions   Baclofen Nausea And Vomiting   Penicillins Rash    Family History  Problem Relation Age of Onset   Hypertension Mother    Diabetes Mother    Hypertension Father    Diabetes Father    Breast cancer Maternal Aunt    Breast cancer Paternal Aunt    Arthritis Maternal Grandmother    Lung disease Paternal Grandfather    Cancer Brother     Prior to Admission medications   Medication Sig  Start Date End Date Taking? Authorizing Provider  acetaminophen (TYLENOL) 500 MG tablet Take 500 mg by mouth every 6 (six) hours as needed for moderate pain (pain score 4-6).    [provider]  amiodarone (PACERONE) 200 MG tablet Take 1 tablet (200 mg total) by mouth 2 (two) times daily for 7 days, THEN 1 tablet (200 mg total) daily. Patient taking differently: Take 200 mg by mouth once daily 05/27/23 05/21/24   Laurey Morale, MD  bumetanide (BUMEX) 1 MG tablet Take 2 tablets (2 mg total) by mouth daily for 3 days, THEN 1.5 tablets (1.5 mg total) daily. Patient taking differently: Patient takes 2 tablets by mouth twice a a day. 06/29/23 07/01/24  Jacklynn Ganong, [name]  calcitRIOL (ROCALTROL) 0.25 MCG capsule Take 2 capsules by mouth daily. 06/25/22   [provider]  ELIQUIS 2.5 MG TABS tablet TAKE 1 TABLET BY MOUTH TWICE A DAY 10/05/22   Laurey Morale, MD  Ensure (ENSURE) Take 237 mLs by mouth every other day.    [provider]  folic acid (FOLVITE) 1 MG tablet TAKE 1 TABLET BY MOUTH EVERY DAY 07/22/22   Laurey Morale, MD  hydroxypropyl methylcellulose / hypromellose (ISOPTO TEARS / GONIOVISC) 2.5 % ophthalmic solution Place 1 drop into both eyes as needed for dry eyes.    [provider]  methocarbamol (ROBAXIN) 500 MG tablet Take 500 mg by mouth 2 (two) times daily as needed for muscle spasms.    [provider]  Multiple Vitamin (MULTIVITAMIN) tablet Take 1 tablet by mouth 2 (two) times a week.    [provider]  omeprazole (PRILOSEC) 40 MG capsule TAKE 1 CAPSULE (40 MG TOTAL) BY MOUTH DAILY AS NEEDED (FOR ACID REFLUX). 04/13/23   [name], [name]  potassium chloride SA (KLOR-CON M) 10 MEQ tablet Take 1 tablet (10 mEq total) by mouth daily. 06/29/23   Milford, [name], [name]  predniSONE (DELTASONE) 5 MG tablet Take 1 tablet (5 mg total) by mouth daily with breakfast. 03/30/23   Rice, Jamesetta Orleans, MD  sodium bicarbonate 650 MG tablet Take 650 mg by mouth 2 (two) times daily. 02/23/23   [provider]    Physical Exam: Vitals:   07/09/23 0943 07/09/23 0944 07/09/23 1100 07/09/23 1130  BP:  103/72 103/79 111/79  Pulse:  85 86 86  Resp:  20 19 19   Temp:  98.2 F (36.8 C)    TempSrc:  Oral    SpO2:  100% 100% 100%  Weight: 45.8 kg     Height: 5\' 3"  (1.6 m)       Constitutional: NAD, calm, comfortable Eyes: PERRL, lids and  conjunctivae normal ENMT: Mucous membranes are dry.  Posterior pharynx clear of any exudate or lesions.   Neck: normal, supple, no masses, no thyromegaly Respiratory: No wheezing.  Coarse breath sounds heard in the bases.  Normal respiratory effort. No accessory muscle use.  Cardiovascular: RRR.  2+ pitting bilateral lower extremity edema tapering at the proximal third of the tibia. Abdomen: no tenderness, no masses palpated. No hepatosplenomegaly. Bowel sounds positive.  Musculoskeletal: no clubbing / cyanosis. No joint deformity upper and lower extremities.Normal muscle tone.  Skin: no obvious lesions on exposed skin Neurologic: CN 2-12 grossly intact. Sensation intact, no cerebellar signs Psychiatric: Normal judgment and insight. Alert and oriented x 3. Normal mood.   Labs on Admission: I have personally reviewed following labs and imaging studies  CBC: Recent Labs  Lab 07/09/23 1045  WBC 9.1  NEUTROABS 7.8*  HGB 10.1*  HCT 33.8*  MCV 96.3  PLT 161   Basic Metabolic Panel: Recent Labs  Lab 07/02/23 1415 07/07/23 1142 07/09/23 1045  NA 140 141 141  K 4.5 4.0 4.5  CL 110 111 111  CO2 20* 20* 21*  GLUCOSE 93 132* 151*  BUN 67* 70* 66*  CREATININE 3.12* 3.50* 3.45*  CALCIUM 9.3 9.4 9.7  MG 2.4  --   --    GFR: Estimated Creatinine Clearance: 9.9 mL/min (A) (by C-G formula based on SCr of 3.45 mg/dL (H)).  Liver Function Tests: Recent Labs  Lab 07/09/23 1045  AST 20  ALT 24  ALKPHOS 77  BILITOT 0.7  PROT 5.8*  ALBUMIN 2.9*    Radiological Exams on Admission: DG Chest Port 1 View  Result Date: 07/09/2023 CLINICAL DATA:  Short of breath.  Leg swelling.  Fever. EXAM: PORTABLE CHEST 1 VIEW COMPARISON:  11/04/2022 and older studies. FINDINGS: Moderate enlargement of the cardiac silhouette, stable. No mediastinal or hilar masses. Opacity at the left lung base obscures the hemidiaphragm, similar to the prior exam. Mild prominence of the interstitial markings also  stable from prior study. Remainder of the lungs is clear. Possible left pleural effusion. No pneumothorax. Skeletal structures are grossly intact. IMPRESSION: 1. Persistent opacity the left lower lung compared to the exam from 11/04/2022. This is suspected to be a combination of pleural fluid and atelectasis. Pneumonia should be considered, however, given the patient's current symptoms. 2. Stable cardiomegaly.  Possible mild interstitial edema/CHF. Electronically Signed   By: Amie Portland M.D.   On: 07/09/2023 10:56    EKG: Independently reviewed.   Assessment/Plan Principal Problem:   Acute heart failure with reduced ejection fraction (HFrEF, <= 40%) (HCC)  Appreciate cardiology  Lasix 40 mg IV every day w K replacement  I/o's, daily wts  Saw palliative care outpatient  Will have PT/OT see her for discharge planning  Active Problems:   GERD (gastroesophageal reflux disease)  Change omeprazole to Protonix 40 mg daily    Non-ischemic cardiomyopathy (HCC)  As above    Rheumatoid arthritis involving multiple sites with positive rheumatoid factor (HCC)  Prednisone 5 mg daily    Atrial flutter (HCC)  Continue amiodarone 200 mg twice daily  Eliquis 2.5 mg twice daily    Sjogren's syndrome (HCC)  Prednisone as above    CKD (chronic kidney disease), stage IV (HCC)  Monitor BMP  Continue sodium bicarbonate 650 mg twice daily and calcitriol 0.5 mcg daily    Anemia of chronic disease  Monitor CBC    Elevated Troponin  Remaining flat, again we appreciate cards; no obvious signs of ischemia on EKG    Acquired thrombophilia (HCC)  DVT prophylaxis: Eliquis Code Status: Full Family Communication: Self Disposition Plan: Hopefully she can diurese and improve overnight.  Will consult physical therapy to evaluate safety of going home. Consults called: Cardiology contacted by EDP Admission status: Observation  Severity of Illness: The appropriate patient status for this patient is  OBSERVATION. Observation status is judged to be reasonable and necessary in order to provide the required intensity of service to ensure the patient's safety. The patient's presenting symptoms, physical exam findings, and initial radiographic and laboratory data in the context of their medical condition is felt to place them at decreased risk for further clinical deterioration. Furthermore, it is anticipated that the patient will be medically stable for discharge from the hospital within 2 midnights of admission.  Sharlene Dory, DO [facility] [email or web address] 07/09/2023, 2:05 PM

## 2023-07-09 NOTE — Evaluation (Signed)
Occupational Therapy Evaluation Patient Details Name: Tracy Nelson MRN: [id number] DOB: May 06, 1946 Today's Date: 07/09/2023   History of Present Illness Pt is a 77 y.o. female who presented to [facility] with c/o SOB and Ble edema. PMH includes chronic systolic CHF, mitral regurgitation, tricuspid regurgitation, chronic anemia, PAF on Eliquis, nonischemic cardiomyopathy, RA, CKD IV, recent IM nail hip 07/10/22, osteoporosis, HTN.   Clinical Impression   Pt admitted for above, may not be far from baseline but on IE she presented as generally weak. PTA pt lived with daughter and has assist from a friend(possible PCA) from 10-1pm and is home alone for some periods of the day. Pt currently ambulating around room with CGA and needs Mod A to setup assist for ADLs. OT to continue to follow pt while in acute setting to reduce risk of decline and reinforce CHF education prn. Patient would benefit from post acute Home OT services to help maximize functional independence in natural environment, help pt be more independent in ADLs when home alone.        If plan is discharge home, recommend the following: A little help with bathing/dressing/bathroom;Assistance with cooking/housework    Functional Status Assessment  Patient has had a recent decline in their functional status and demonstrates the ability to make significant improvements in function in a reasonable and predictable amount of time.  Equipment Recommendations  None recommended by OT (pt has rec DME)    Recommendations for Other Services       Precautions / Restrictions Precautions Precautions: Fall Restrictions Weight Bearing Restrictions: No      Mobility Bed Mobility Overal bed mobility: Needs Assistance Bed Mobility: Supine to Sit, Sit to Supine     Supine to sit: Min assist Sit to supine: Contact guard assist   General bed mobility comments: from ED stretcher. Min A to raise trunk and partially assist with BLEs     Transfers Overall transfer level: Needs assistance Equipment used: Rolling walker (2 wheels) Transfers: Sit to/from Stand Sit to Stand: Contact guard assist           General transfer comment: from tall ED stretcher      Balance Overall balance assessment: Needs assistance Sitting-balance support: No upper extremity supported, Feet supported Sitting balance-Leahy Scale: Good Sitting balance - Comments: sitting EOB   Standing balance support: During functional activity, Bilateral upper extremity supported Standing balance-Leahy Scale: Poor Standing balance comment: RW support                           ADL either performed or assessed with clinical judgement   ADL Overall ADL's : Needs assistance/impaired Eating/Feeding: Independent;Sitting   Grooming: Sitting;Set up   Upper Body Bathing: Sitting;Set up   Lower Body Bathing: Sitting/lateral leans;Set up   Upper Body Dressing : Sitting;Set up   Lower Body Dressing: Sitting/lateral leans;Moderate assistance Lower Body Dressing Details (indicate cue type and reason): assist needed to doff/don socks, pt able to doff socks on RLE without assist but struggles with LLE due to deficits Toilet Transfer: Contact guard assist;Rolling walker (2 wheels)   Toileting- Clothing Manipulation and Hygiene: Contact guard assist;Sit to/from stand       Functional mobility during ADLs: Contact guard assist;Rolling walker (2 wheels) General ADL Comments: Pt ambulated from hallway ED stretcher to bed     Vision         Perception         Praxis  Pertinent Vitals/Pain Pain Assessment Pain Assessment: Faces Faces Pain Scale: Hurts little more Pain Location: bilat feet when adjusting socks Pain Descriptors / Indicators: Aching, Tender, Grimacing, Guarding Pain Intervention(s): Limited activity within patient's tolerance, Monitored during session, Repositioned     Extremity/Trunk Assessment Upper  Extremity Assessment Upper Extremity Assessment: Generalized weakness   Lower Extremity Assessment Lower Extremity Assessment: Defer to PT evaluation       Communication Communication Communication: No apparent difficulties Cueing Techniques: Verbal cues   Cognition Arousal: Alert Behavior During Therapy: WFL for tasks assessed/performed Overall Cognitive Status: Within Functional Limits for tasks assessed                                       General Comments  VSS on RA, pt reports some lingering SOB    Exercises     Shoulder Instructions      Home Living Family/patient expects to be discharged to:: Private residence Living Arrangements: Children (daughter and daughters family) Available Help at Discharge: Family;Available PRN/intermittently Type of Home: House Home Access: Level entry     Home Layout: Two level;Able to live on main level with bedroom/bathroom     Bathroom Shower/Tub: Chief Strategy Officer: Standard     Home Equipment: Agricultural consultant (2 wheels);Rollator (4 wheels);BSC/3in1   Additional Comments: Lives with daughter and her family - she is typically alone during the day      Prior Functioning/Environment Prior Level of Function : Independent/Modified Independent             Mobility Comments: uses rollator for mobility ADLs Comments: sponge bathes at baseline, reports some assist with med mgmt, daughter provides transportation        OT Problem List: Decreased strength;Impaired balance (sitting and/or standing);Increased edema;Decreased knowledge of precautions      OT Treatment/Interventions: Self-care/ADL training;Balance training;Therapeutic exercise;Therapeutic activities;Patient/family education    OT Goals(Current goals can be found in the care plan section) Acute Rehab OT Goals Patient Stated Goal: To get fluid off OT Goal Formulation: With patient Time For Goal Achievement: 07/23/23 Potential to  Achieve Goals: Good ADL Goals Pt Will Perform Grooming: standing;with supervision Pt Will Transfer to Toilet: with supervision;ambulating Additional ADL Goal #1: pt will verbalize understanding of 3 symptoms associated with CHF Additional ADL Goal #2: Pt will verbalize understanding of at least 3 strategies to reduce the risk of CHF exacerbation  OT Frequency: Min 1X/week    Co-evaluation              AM-PAC OT "6 Clicks" Daily Activity     Outcome Measure Help from another person eating meals?: None Help from another person taking care of personal grooming?: A Little Help from another person toileting, which includes using toliet, bedpan, or urinal?: A Little Help from another person bathing (including washing, rinsing, drying)?: A Little Help from another person to put on and taking off regular upper body clothing?: A Little Help from another person to put on and taking off regular lower body clothing?: A Lot 6 Click Score: 18   End of Session Equipment Utilized During Treatment: Gait belt;Rolling walker (2 wheels) Nurse Communication: Mobility status  Activity Tolerance: Patient tolerated treatment well Patient left: in bed;with call bell/phone within reach;with nursing/sitter in room  OT Visit Diagnosis: Unsteadiness on feet (R26.81);Muscle weakness (generalized) (M62.81);Other (comment) (SOB)  Time: 1914-7829 OT Time Calculation (min): 13 min Charges:  OT General Charges $OT Visit: 1 Visit OT Evaluation $OT Eval Low Complexity: 1 Low  07/09/2023  AB, OTR/L  Acute Rehabilitation Services  Office: [phone]   Tristan Schroeder 07/09/2023, 5:08 PM

## 2023-07-09 NOTE — ED Triage Notes (Signed)
Pt c.o sob and bilateral lower leg edema. Pt has tried 3 SQ lasix doses within the last week without much relief. Last dose was Wednesday. Pt denies chest pain

## 2023-07-09 NOTE — Progress Notes (Signed)
Lactic acid level pending, have s/o to on call APP to review with Dr. Wyline Mood when this results.

## 2023-07-10 DIAGNOSIS — I959 Hypotension, unspecified: Secondary | ICD-10-CM | POA: Diagnosis present

## 2023-07-10 DIAGNOSIS — I5082 Biventricular heart failure: Secondary | ICD-10-CM | POA: Diagnosis present

## 2023-07-10 DIAGNOSIS — D472 Monoclonal gammopathy: Secondary | ICD-10-CM | POA: Diagnosis present

## 2023-07-10 DIAGNOSIS — I130 Hypertensive heart and chronic kidney disease with heart failure and stage 1 through stage 4 chronic kidney disease, or unspecified chronic kidney disease: Secondary | ICD-10-CM | POA: Diagnosis present

## 2023-07-10 DIAGNOSIS — M0579 Rheumatoid arthritis with rheumatoid factor of multiple sites without organ or systems involvement: Secondary | ICD-10-CM

## 2023-07-10 DIAGNOSIS — I083 Combined rheumatic disorders of mitral, aortic and tricuspid valves: Secondary | ICD-10-CM | POA: Diagnosis present

## 2023-07-10 DIAGNOSIS — I10 Essential (primary) hypertension: Secondary | ICD-10-CM

## 2023-07-10 DIAGNOSIS — N184 Chronic kidney disease, stage 4 (severe): Secondary | ICD-10-CM

## 2023-07-10 DIAGNOSIS — R64 Cachexia: Secondary | ICD-10-CM | POA: Diagnosis present

## 2023-07-10 DIAGNOSIS — I5023 Acute on chronic systolic (congestive) heart failure: Secondary | ICD-10-CM | POA: Diagnosis present

## 2023-07-10 DIAGNOSIS — I420 Dilated cardiomyopathy: Secondary | ICD-10-CM | POA: Diagnosis present

## 2023-07-10 DIAGNOSIS — I272 Pulmonary hypertension, unspecified: Secondary | ICD-10-CM | POA: Diagnosis present

## 2023-07-10 DIAGNOSIS — I5021 Acute systolic (congestive) heart failure: Secondary | ICD-10-CM | POA: Diagnosis not present

## 2023-07-10 DIAGNOSIS — N179 Acute kidney failure, unspecified: Secondary | ICD-10-CM | POA: Diagnosis not present

## 2023-07-10 DIAGNOSIS — I509 Heart failure, unspecified: Secondary | ICD-10-CM | POA: Diagnosis present

## 2023-07-10 DIAGNOSIS — I480 Paroxysmal atrial fibrillation: Secondary | ICD-10-CM | POA: Diagnosis present

## 2023-07-10 DIAGNOSIS — N250 Renal osteodystrophy: Secondary | ICD-10-CM | POA: Diagnosis present

## 2023-07-10 DIAGNOSIS — K219 Gastro-esophageal reflux disease without esophagitis: Secondary | ICD-10-CM | POA: Diagnosis not present

## 2023-07-10 DIAGNOSIS — D631 Anemia in chronic kidney disease: Secondary | ICD-10-CM | POA: Diagnosis present

## 2023-07-10 DIAGNOSIS — I5084 End stage heart failure: Secondary | ICD-10-CM | POA: Diagnosis present

## 2023-07-10 DIAGNOSIS — Z681 Body mass index (BMI) 19 or less, adult: Secondary | ICD-10-CM | POA: Diagnosis not present

## 2023-07-10 DIAGNOSIS — R109 Unspecified abdominal pain: Secondary | ICD-10-CM

## 2023-07-10 DIAGNOSIS — Z7189 Other specified counseling: Secondary | ICD-10-CM | POA: Diagnosis not present

## 2023-07-10 DIAGNOSIS — I472 Ventricular tachycardia, unspecified: Secondary | ICD-10-CM | POA: Diagnosis present

## 2023-07-10 DIAGNOSIS — M3500 Sicca syndrome, unspecified: Secondary | ICD-10-CM | POA: Diagnosis present

## 2023-07-10 DIAGNOSIS — Z515 Encounter for palliative care: Secondary | ICD-10-CM | POA: Diagnosis not present

## 2023-07-10 DIAGNOSIS — R54 Age-related physical debility: Secondary | ICD-10-CM | POA: Diagnosis present

## 2023-07-10 DIAGNOSIS — I4892 Unspecified atrial flutter: Secondary | ICD-10-CM | POA: Diagnosis present

## 2023-07-10 DIAGNOSIS — D6859 Other primary thrombophilia: Secondary | ICD-10-CM | POA: Diagnosis present

## 2023-07-10 LAB — CBC WITH DIFFERENTIAL/PLATELET
Abs Immature Granulocytes: 0.05 10*3/uL (ref 0.00–0.07)
Basophils Absolute: 0 10*3/uL (ref 0.0–0.1)
Basophils Relative: 1 %
Eosinophils Absolute: 0.1 10*3/uL (ref 0.0–0.5)
Eosinophils Relative: 1 %
HCT: 32.6 % — ABNORMAL LOW (ref 36.0–46.0)
Hemoglobin: 10.1 g/dL — ABNORMAL LOW (ref 12.0–15.0)
Immature Granulocytes: 1 %
Lymphocytes Relative: 15 %
Lymphs Abs: 1.3 10*3/uL (ref 0.7–4.0)
MCH: 29.4 pg (ref 26.0–34.0)
MCHC: 31 g/dL (ref 30.0–36.0)
MCV: 94.8 fL (ref 80.0–100.0)
Monocytes Absolute: 0.7 10*3/uL (ref 0.1–1.0)
Monocytes Relative: 8 %
Neutro Abs: 6.5 10*3/uL (ref 1.7–7.7)
Neutrophils Relative %: 74 %
Platelets: 163 10*3/uL (ref 150–400)
RBC: 3.44 MIL/uL — ABNORMAL LOW (ref 3.87–5.11)
RDW: 17.3 % — ABNORMAL HIGH (ref 11.5–15.5)
WBC: 8.6 10*3/uL (ref 4.0–10.5)
nRBC: 0 % (ref 0.0–0.2)

## 2023-07-10 LAB — BASIC METABOLIC PANEL
Anion gap: 7 (ref 5–15)
BUN: 66 mg/dL — ABNORMAL HIGH (ref 8–23)
CO2: 22 mmol/L (ref 22–32)
Calcium: 10 mg/dL (ref 8.9–10.3)
Chloride: 111 mmol/L (ref 98–111)
Creatinine, Ser: 3.44 mg/dL — ABNORMAL HIGH (ref 0.44–1.00)
GFR, Estimated: 13 mL/min — ABNORMAL LOW (ref >60)
Glucose, Bld: 124 mg/dL — ABNORMAL HIGH (ref 70–99)
Potassium: 4.2 mmol/L (ref 3.5–5.1)
Sodium: 140 mmol/L (ref 135–145)

## 2023-07-10 LAB — TSH: TSH: 2.343 u[IU]/mL (ref 0.350–4.500)

## 2023-07-10 MED ORDER — HYDRALAZINE HCL 10 MG PO TABS
10.0000 mg | ORAL_TABLET | Freq: Three times a day (TID) | ORAL | Status: DC
  Filled 2023-07-10 (×2): qty 1

## 2023-07-10 MED ORDER — FUROSEMIDE 10 MG/ML IJ SOLN
100.0000 mg | Freq: Two times a day (BID) | INTRAVENOUS | Status: DC
  Administered 2023-07-10 (×2): 100 mg via INTRAVENOUS
  Filled 2023-07-10 (×4): qty 10

## 2023-07-10 NOTE — Plan of Care (Signed)

## 2023-07-10 NOTE — Assessment & Plan Note (Addendum)
Currently rate controlled, sinus rhythm.  Continue anticoagulation with apixaban, renal dose to 2.5 mg daily

## 2023-07-10 NOTE — Evaluation (Signed)
Physical Therapy Evaluation Patient Details Name: Tracy Nelson MRN: [id number] DOB: 09/04/1945 Today's Date: 07/10/2023  History of Present Illness  Pt is a 77 y.o. female who presented to [facility] with c/o SOB and BLE edema. Admitted w/ acute HFrEF. PMH includes chronic systolic CHF, mitral regurgitation, tricuspid regurgitation, chronic anemia, PAF on Eliquis, nonischemic cardiomyopathy, RA, CKD IV, recent IM nail hip 07/10/22, osteoporosis, HTN.   Clinical Impression  Pt on BSC upon arrival and agreeable to PT eval. Prior to admission, pt was modI with a rollator for mobility. Pt was able to stand and ambulate in the room with supervision/CGA for safety. Pt was able to stand with no AD, however, requires UE support for dynamic activities. Pt required assistance to don briefs while in standing. Pt presents to therapy session with decreased strength, balance, and activity tolerance. Pt would benefit from acute skilled PT to address functional impairments. Recommending post-acute HHPT to return to previous level of independence. Acute PT to follow.          If plan is discharge home, recommend the following: A little help with walking and/or transfers;Help with stairs or ramp for entrance;A little help with bathing/dressing/bathroom   Can travel by private vehicle    Yes    Equipment Recommendations None recommended by PT     Functional Status Assessment Patient has had a recent decline in their functional status and demonstrates the ability to make significant improvements in function in a reasonable and predictable amount of time.     Precautions / Restrictions Precautions Precautions: Fall Restrictions Weight Bearing Restrictions: No      Mobility  Bed Mobility General bed mobility comments: on BSC upon arrival, returned to recliner    Transfers Overall transfer level: Needs assistance Equipment used: Rolling walker (2 wheels) Transfers: Sit to/from Stand Sit to Stand:  Supervision    General transfer comment: supervision for safety    Ambulation/Gait Ambulation/Gait assistance: Contact guard assist Gait Distance (Feet): 30 Feet Assistive device: Rolling walker (2 wheels) Gait Pattern/deviations: Step-through pattern, Shuffle  General Gait Details: slow and steady gait with RW, CGA for safety. Cues to keep RW close as pt tend to leave it behind        Balance Overall balance assessment: Needs assistance Sitting-balance support: No upper extremity supported, Feet supported Sitting balance-Leahy Scale: Good     Standing balance support: Bilateral upper extremity supported, Reliant on assistive device for balance, During functional activity Standing balance-Leahy Scale: Poor Standing balance comment: RW support        Pertinent Vitals/Pain Pain Assessment Pain Assessment: No/denies pain    Home Living Family/patient expects to be discharged to:: Private residence Living Arrangements: Children (daughter and daughters family) Available Help at Discharge: Family;Available PRN/intermittently Type of Home: House Home Access: Level entry       Home Layout: Two level;Able to live on main level with bedroom/bathroom Home Equipment: Rolling Walker (2 wheels);Rollator (4 wheels);BSC/3in1 Additional Comments: Lives with daughter and her family - she is typically alone during the day    Prior Function Prior Level of Function : Independent/Modified Independent      Mobility Comments: uses rollator for mobility ADLs Comments: sponge bathes at baseline, reports some assist with med mgmt, daughter provides transportation     Extremity/Trunk Assessment   Upper Extremity Assessment Upper Extremity Assessment: Defer to OT evaluation    Lower Extremity Assessment Lower Extremity Assessment: Generalized weakness    Cervical / Trunk Assessment Cervical / Trunk Assessment: Kyphotic  Communication   Communication Communication: No apparent  difficulties Cueing Techniques: Verbal cues  Cognition Arousal: Alert Behavior During Therapy: WFL for tasks assessed/performed Overall Cognitive Status: Within Functional Limits for tasks assessed         General Comments General comments (skin integrity, edema, etc.): VSS on RA     PT Assessment Patient needs continued PT services  PT Problem List Decreased strength;Decreased activity tolerance;Decreased balance;Decreased mobility;Decreased safety awareness       PT Treatment Interventions DME instruction;Gait training;Functional mobility training;Therapeutic activities;Therapeutic exercise;Balance training;Patient/family education    PT Goals (Current goals can be found in the Care Plan section)  Acute Rehab PT Goals Patient Stated Goal: to get stronger PT Goal Formulation: With patient Time For Goal Achievement: 07/24/23 Potential to Achieve Goals: Good    Frequency Min 1X/week        AM-PAC PT "6 Clicks" Mobility  Outcome Measure Help needed turning from your back to your side while in a flat bed without using bedrails?: None Help needed moving from lying on your back to sitting on the side of a flat bed without using bedrails?: A Little Help needed moving to and from a bed to a chair (including a wheelchair)?: A Little Help needed standing up from a chair using your arms (e.g., wheelchair or bedside chair)?: A Little Help needed to walk in hospital room?: A Little Help needed climbing 3-5 steps with a railing? : A Lot 6 Click Score: 18    End of Session Equipment Utilized During Treatment: Gait belt Activity Tolerance: Patient tolerated treatment well Patient left: in chair;with call bell/phone within reach;with nursing/sitter in room Nurse Communication: Mobility status PT Visit Diagnosis: Unsteadiness on feet (R26.81);Muscle weakness (generalized) (M62.81)    Time: 8413-2440 PT Time Calculation (min) (ACUTE ONLY): 24 min   Charges:   PT Evaluation $PT  Eval Low Complexity: 1 Low PT Treatments $Therapeutic Activity: 8-22 mins PT General Charges $$ ACUTE PT VISIT: 1 Visit         Hilton Cork, PT, DPT Secure Chat Preferred  Rehab Office [phone]  Arturo Morton Brion [employer] 07/10/2023, 10:59 AM

## 2023-07-10 NOTE — Progress Notes (Signed)
Progress Note   Patient: Tracy Nelson ZOX:[id number] DOB: Dec 01, 1945 DOA: 07/09/2023     0 DOS: the patient was seen and examined on 07/10/2023   Brief hospital course: [name] was admitted to the hospital with the working diagnosis of heart failure exacerbation.   77 yo female with the past medical history of heart failure, CKD, rheumatoid arthritis, and atrial fibrillation who presented with dyspnea. Reported worsening dyspnea for the last 2 weeks. She was evaluated as outpatient at the heart failure clinic, she received sq furosemide and oral bumetanide with improvement in her edema, but with no resolution of her symptoms. Because she continue to have dyspnea she presented to the ED. On her initial physical examination her blood pressure was 103/72, HR 85, RR 20 and 02 saturation 100%, lungs with no wheezing, or rhonchi, heart with S1 and S2 present and regular with no gallops, rubs or murmurs, abdomen with no distention, positive lower extremity edema.   EKG 86 bpm, left axis deviation, normal intervals, sinus rhythm with poor R R wave progression, with no significant ST segment or T wave changes.     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   Documented urine output is 400 cc Systolic blood pressure is 103 to 107 mmHg.   Plan to continue diuresis with furosemide 100 mg IV bid. Limited pharmacologic options due to reduced GFR and risk of hypotension.  Hydralazine for after load reduction (holding  parameters in case of hypotension).   CKD (chronic kidney disease), stage IV (HCC) Renal function with serum cr at 3,4 with K at 4,2 and serum bicarbonate at 22.  Na 140  Plan to continue diuresis with IV furosemide.  Follow up renal function in am. Patient continue with hypervolemia today  Anemia of chronic renal disease. Cell count has been stable.    Metabolic bone disease, continue with calcitriol.   HTN (hypertension) Patient with low blood pressure.  Continue furosemide as tolerated.   Atrial flutter (HCC) Currently rate controlled, sinus rhythm.  Continue anticoagulation with apixaban, renal dose to 2.5 mg daily   Rheumatoid arthritis involving multiple sites with positive rheumatoid factor (HCC) Sjogren's syndrome.   Continue with home dose of prednisone 5 mg No current indication for stress doses.   GERD (gastroesophageal reflux disease) Continue with pantoprazole.         Subjective: Patient continue feeling very weak and deconditioned, continue with dyspnea. She is seating in the chair at the side of the bed.   Physical Exam: Vitals:   07/09/23 1942 07/10/23 0109 07/10/23 0556 07/10/23 1100  BP: 106/80 107/83 108/87 103/79  Pulse: 84 81 79 81  Resp:    20  Temp: 98.1 F (36.7 C) 98.5 F (36.9 C) 98.2 F (36.8 C) (!) 97.4 F (36.3 C)  TempSrc: Oral Oral Oral Oral  SpO2: 99% 99% 100% 99%  Weight:   50.6 kg   Height:       Neurology awake and alert, deconditioned and ill looking appearing  ENT with mild pallor Cardiovascular with S1 and S2 present and regular with no gallops,  or rubs, positive murmur at the right lower sternal border Mild to moderate JVD No lower extremity edema Respiratory with rales at bases with no wheezing or rhonchi Abdomen with no distention  Data Reviewed:    Family Communication: no family at the bedside   Disposition: Status is: Inpatient Remains inpatient appropriate because: IV diuresis   Planned Discharge  Destination: Home    Author: Coralie Keens, MD 07/10/2023 2:16 PM  For on call review [email or web address].

## 2023-07-10 NOTE — Assessment & Plan Note (Signed)
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   Documented urine output is 400 cc Systolic blood pressure is 103 to 107 mmHg.   Plan to continue diuresis with furosemide 100 mg IV bid. Limited pharmacologic options due to reduced GFR and risk of hypotension.  Hydralazine for after load reduction (holding parameters in case of hypotension).

## 2023-07-10 NOTE — Progress Notes (Signed)
Cardiology Progress Note  [name] MRN: [id number] DOB: Mar 30, 1946 Date of Encounter: 07/10/2023 Primary Cardiologist: None  Subjective   Chief Complaint: SOB  HPI: Reports shortness of breath.  Net -400 cc.  Still volume up.  ROS:  All other ROS reviewed and negative. Pertinent positives noted in the HPI.     Telemetry  Overnight telemetry shows sinus rhythm 80s, which I personally reviewed.    Physical Exam   Vitals:   07/09/23 1548 07/09/23 1942 07/10/23 0109 07/10/23 0556  BP: 113/88 106/80 107/83 108/87  Pulse: 85 84 81 79  Resp: 19     Temp:  98.1 F (36.7 C) 98.5 F (36.9 C) 98.2 F (36.8 C)  TempSrc:  Oral Oral Oral  SpO2: 100% 99% 99% 100%  Weight: 48.9 kg   50.6 kg  Height: 5\' 3"  (1.6 m)       Intake/Output Summary (Last 24 hours) at 07/10/2023 0821 Last data filed at 07/10/2023 0111 Gross per 24 hour  Intake --  Output 400 ml  Net -400 ml       07/10/2023    5:56 AM 07/09/2023    3:48 PM 07/09/2023    9:43 AM  Last 3 Weights  Weight (lbs) 111 lb 8.8 oz 107 lb 11.2 oz 101 lb  Weight (kg) 50.6 kg 48.852 kg 45.813 kg    Body mass index is 19.76 kg/m.  General: Well nourished, well developed, in no acute distress Head: Atraumatic, normal size  Eyes: PEERLA, EOMI  Neck: Supple, JVD 8 to 10 cm of water Endocrine: No thryomegaly Cardiac: Normal S1, S2; RRR; 2 out of 6 holosystolic murmur Lungs: Crackles at the lung bases Abd: Distended abdomen Ext: Trace edema Musculoskeletal: No deformities, BUE and BLE strength normal and equal Skin: Warm and dry, no rashes   Neuro: Alert and oriented to person, place, time, and situation, CNII-XII grossly intact, no focal deficits  Psych: Normal mood and affect   Cardiac Studies  TTE 10/29/2022  1. Left ventricular ejection fraction, by estimation, is 20 to 25%. The  left ventricle has severely decreased function. The left ventricle  demonstrates global hypokinesis. The left ventricular  internal cavity size  was mildly dilated. Left ventricular  diastolic function could not be evaluated. Elevated left ventricular  end-diastolic pressure.   2. Right ventricular systolic function is severely reduced. The right  ventricular size is mildly enlarged. There is normal pulmonary artery  systolic pressure.   3. Left atrial size was severely dilated.   4. Right atrial size was severely dilated.   5. The mitral valve is degenerative. Moderate mitral valve regurgitation.  No evidence of mitral stenosis.   6. Tricuspid valve regurgitation is moderate to severe.   7. The aortic valve is calcified. Aortic valve regurgitation is mild to  moderate. Aortic valve sclerosis/calcification is present, without any  evidence of aortic stenosis.   8. Abdominal aorta is normal size.   9. The inferior vena cava is normal in size with greater than 50%  respiratory variability, suggesting right atrial pressure of 3 mmHg.   Patient Profile  Tracy Nelson is a 77 y.o. 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  Assessment & Plan   # Acute on chronic systolic heart failure, EF 20-25% # CKD stage V -Followed by the advanced heart failure clinic.  Apparently  she has limited options for advanced therapies in the setting of advanced kidney disease. -Medical management has been recommended.  Also it appears palliative care has been recommended. -I do agree with palliative care consultation. -She has been intolerant of most GDMT.  Blood pressures are stable.  Will try to get her on hydralazine 10 mg 3 times daily for afterload reduction to help with diuresis. -Increase Lasix to 100 mg IV twice daily.  We likely will add metolazone as we are able.  Her kidney function is rather advanced.  Close  monitoring. -Lactic acid is normal.  No signs of shock. -For now we can offer is medical management.  Not a candidate for advanced heart failure therapies.  # Paroxysmal atrial fibrillation/flutter -Continue with rhythm control strategy of amiodarone.  On Eliquis.   For questions or updates, please contact [facility] Please consult [email or web address] for contact info under     Signed, Gerri Spore T. Flora Lipps, MD, [facility]   [facility]  07/10/2023 8:21 AM

## 2023-07-10 NOTE — Assessment & Plan Note (Addendum)
Sjogren's syndrome.   Continue with home dose of prednisone 5 mg No current indication for stress doses.

## 2023-07-10 NOTE — Assessment & Plan Note (Addendum)
Today renal function with serum cr at 3,68 with K at 4,1 and serum bicarbonate at 23.  Na 138 Mg 2.3   Continue torsemide  Follow up renal function in am.  Anemia of chronic renal disease. Cell count has been stable.    Metabolic bone disease, continue with calcitriol.

## 2023-07-10 NOTE — Assessment & Plan Note (Signed)
Continue with pantoprazole/.  

## 2023-07-10 NOTE — Assessment & Plan Note (Signed)
Patient with low blood pressure.  Continue furosemide as tolerated.

## 2023-07-10 NOTE — Hospital Course (Addendum)
Mrs. Payton Emerald was admitted to the hospital with the working diagnosis of heart failure exacerbation.   77 yo female with the past medical history of heart failure, CKD, rheumatoid arthritis, and atrial fibrillation who presented with dyspnea. Reported worsening dyspnea for the last 2 weeks. She was evaluated as outpatient at the heart failure clinic, she received sq furosemide and oral bumetanide with improvement in her edema, but with no resolution of her symptoms. Because she continue to have dyspnea she presented to the ED. On her initial physical examination her blood pressure was 103/72, HR 85, RR 20 and 02 saturation 100%, lungs with no wheezing, or rhonchi, heart with S1 and S2 present and regular with no gallops, rubs or murmurs, abdomen with no distention, positive lower extremity edema.   EKG 86 bpm, left axis deviation, normal intervals, sinus rhythm with poor R R wave progression, with no significant ST segment or T wave changes.   Patient has been placed on IV furosemide for diuresis.   12/01 continue volume overloaded. Poor prognosis.  12/02 continue aggressive diuresis, will consult palliative care.

## 2023-07-11 DIAGNOSIS — M0579 Rheumatoid arthritis with rheumatoid factor of multiple sites without organ or systems involvement: Secondary | ICD-10-CM | POA: Diagnosis not present

## 2023-07-11 DIAGNOSIS — I5023 Acute on chronic systolic (congestive) heart failure: Secondary | ICD-10-CM | POA: Diagnosis not present

## 2023-07-11 DIAGNOSIS — N184 Chronic kidney disease, stage 4 (severe): Secondary | ICD-10-CM | POA: Diagnosis not present

## 2023-07-11 DIAGNOSIS — I10 Essential (primary) hypertension: Secondary | ICD-10-CM | POA: Diagnosis not present

## 2023-07-11 LAB — BASIC METABOLIC PANEL
Anion gap: 9 (ref 5–15)
BUN: 65 mg/dL — ABNORMAL HIGH (ref 8–23)
CO2: 23 mmol/L (ref 22–32)
Calcium: 10 mg/dL (ref 8.9–10.3)
Chloride: 105 mmol/L (ref 98–111)
Creatinine, Ser: 3.64 mg/dL — ABNORMAL HIGH (ref 0.44–1.00)
GFR, Estimated: 12 mL/min — ABNORMAL LOW (ref >60)
Glucose, Bld: 127 mg/dL — ABNORMAL HIGH (ref 70–99)
Potassium: 4.4 mmol/L (ref 3.5–5.1)
Sodium: 137 mmol/L (ref 135–145)

## 2023-07-11 LAB — MAGNESIUM: Magnesium: 2.3 mg/dL (ref 1.7–2.4)

## 2023-07-11 MED ORDER — BUMETANIDE 1 MG PO TABS
1.5000 mg | ORAL_TABLET | Freq: Every day | ORAL | Status: DC
  Administered 2023-07-11 – 2023-07-12 (×2): 1.5 mg via ORAL
  Filled 2023-07-11 (×2): qty 2

## 2023-07-11 MED ORDER — FUROSEMIDE 10 MG/ML IJ SOLN
80.0000 mg | Freq: Once | INTRAMUSCULAR | Status: AC
  Administered 2023-07-11: 80 mg via INTRAVENOUS
  Filled 2023-07-11: qty 8

## 2023-07-11 MED ORDER — ISOSORBIDE MONONITRATE ER 30 MG PO TB24: 15.0000 mg | ORAL_TABLET | Freq: Every day | ORAL | Status: DC

## 2023-07-11 NOTE — Plan of Care (Signed)

## 2023-07-11 NOTE — Progress Notes (Signed)
Cardiology Progress Note  [name] MRN: [id number] DOB: 04-15-1946 Date of Encounter: 07/11/2023 Primary Cardiologist: None  Subjective   Chief Complaint: SOB  HPI: Volume status improved.  She is still short of breath but seems to be euvolemic.  ROS:  All other ROS reviewed and negative. Pertinent positives noted in the HPI.     Telemetry  Overnight telemetry shows sinus rhythm 70s, which I personally reviewed.    Physical Exam   Vitals:   07/10/23 2029 07/11/23 0036 07/11/23 0538 07/11/23 0808  BP: 107/79 109/83 106/83 113/87  Pulse: 83 83 80   Resp: 16 16 15 17   Temp: 98.1 F (36.7 C) 98.2 F (36.8 C) 98.1 F (36.7 C) 98 F (36.7 C)  TempSrc: Oral Oral Oral Oral  SpO2: 100% 100% 98%   Weight:   48.7 kg   Height:        Intake/Output Summary (Last 24 hours) at 07/11/2023 0817 Last data filed at 07/11/2023 0542 Gross per 24 hour  Intake 1019.54 ml  Output 1550 ml  Net -530.46 ml       07/11/2023    5:38 AM 07/10/2023    5:56 AM 07/09/2023    3:48 PM  Last 3 Weights  Weight (lbs) 107 lb 4.8 oz 111 lb 8.8 oz 107 lb 11.2 oz  Weight (kg) 48.671 kg 50.6 kg 48.852 kg    Body mass index is 19.01 kg/m.  General: Well nourished, well developed, in no acute distress Head: Atraumatic, normal size  Eyes: PEERLA, EOMI  Neck: Supple, no JVD Endocrine: No thryomegaly Cardiac: Normal S1, S2; RRR; no murmurs, rubs, or gallops Lungs: Clear to auscultation bilaterally, no wheezing, rhonchi or rales  Abd: Soft, nontender, no hepatomegaly  Ext: No edema, pulses 2+ Musculoskeletal: No deformities, BUE and BLE strength normal and equal Skin: Warm and dry, no rashes   Neuro: Alert and oriented to person, place, time, and situation, CNII-XII grossly intact, no focal deficits  Psych: Normal mood and affect   Cardiac Studies  TTE 10/29/2022  1. Left ventricular ejection fraction, by estimation, is 20 to 25%. The  left ventricle has severely decreased  function. The left ventricle  demonstrates global hypokinesis. The left ventricular internal cavity size  was mildly dilated. Left ventricular  diastolic function could not be evaluated. Elevated left ventricular  end-diastolic pressure.   2. Right ventricular systolic function is severely reduced. The right  ventricular size is mildly enlarged. There is normal pulmonary artery  systolic pressure.   3. Left atrial size was severely dilated.   4. Right atrial size was severely dilated.   5. The mitral valve is degenerative. Moderate mitral valve regurgitation.  No evidence of mitral stenosis.   6. Tricuspid valve regurgitation is moderate to severe.   7. The aortic valve is calcified. Aortic valve regurgitation is mild to  moderate. Aortic valve sclerosis/calcification is present, without any  evidence of aortic stenosis.   8. Abdominal aorta is normal size.   9. The inferior vena cava is normal in size with greater than 50%  respiratory variability, suggesting right atrial pressure of 3 mmHg.   Patient Profile  Tracy Nelson is a 77 y.o. 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   Assessment & Plan   # Acute on chronic systolic heart failure, EF 20 to 25% #CKD  stage V -Advanced cardiomyopathy.  Not a candidate for advanced therapies in the setting of advanced kidney disease.  Palliative care and hospice have been recommended but the patient is reluctant to move in this direction. -She has improved with IV diuresis.  Euvolemic.  Transition back to bumetanide 1.5 mg daily. -She is intolerant of GDMT due to low blood pressure.  Would recommend to continue to hold medications for now. -We will arrange outpatient follow-up in the heart failure clinic.  # Paroxysmal atrial  fibrillation/flutter -Continue rhythm control strategy with amiodarone.  On Eliquis.  Maintaining sinus rhythm.  [facility] will sign off.   Medication Recommendations:  As above Other recommendations (labs, testing, etc):  none Follow up as an outpatient:  1-2 weeks HF clinic  For questions or updates, please contact [facility] Please consult [email or web address] for contact info under      Signed, Gerri Spore T. Flora Lipps, MD, [facility]  [facility]  07/11/2023 8:17 AM \

## 2023-07-11 NOTE — Progress Notes (Signed)
Progress Note   Patient: Tracy Nelson ZDG:[id number] DOB: 29-Nov-1945 DOA: 07/09/2023     1 DOS: the patient was seen and examined on 07/11/2023   Brief hospital course: [name] was admitted to the hospital with the working diagnosis of heart failure exacerbation.   77 yo female with the past medical history of heart failure, CKD, rheumatoid arthritis, and atrial fibrillation who presented with dyspnea. Reported worsening dyspnea for the last 2 weeks. She was evaluated as outpatient at the heart failure clinic, she received sq furosemide and oral bumetanide with improvement in her edema, but with no resolution of her symptoms. Because she continue to have dyspnea she presented to the ED. On her initial physical examination her blood pressure was 103/72, HR 85, RR 20 and 02 saturation 100%, lungs with no wheezing, or rhonchi, heart with S1 and S2 present and regular with no gallops, rubs or murmurs, abdomen with no distention, positive lower extremity edema.   EKG 86 bpm, left axis deviation, normal intervals, sinus rhythm with poor R R wave progression, with no significant ST segment or T wave changes.   Patient has been placed on IV furosemide for diuresis.   12/01 continue volume overloaded. Poor prognosis.   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   Documented urine output is 1,550 cc Systolic blood pressure is 103 to 109 mmHg.   Plan to continue diuresis with bumetanide 1,5 mg  Will give an extra  dose of furosemide IV today 80 mg.  Limited pharmacologic options due to reduced GFR and risk of hypotension.   CKD (chronic kidney disease), stage IV (HCC) Renal function with serum cr at 3,64 with K at 4,4 and serum bicarbonate at 23.  Na 137   Plan to continue diuresis with IV furosemide 80 x1, she has been transition to bumetanide.  Follow up renal function in am.  Anemia of chronic renal disease. Cell count has been stable.    Metabolic bone disease, continue with calcitriol.   HTN (hypertension) Patient with low blood pressure.   Atrial flutter (HCC) Currently rate controlled, sinus rhythm.  Continue anticoagulation with apixaban, renal dose to 2.5 mg daily   Rheumatoid arthritis involving multiple sites with positive rheumatoid factor (HCC) Sjogren's syndrome.   Continue with home dose of prednisone 5 mg No current indication for stress doses.   GERD (gastroesophageal reflux disease) Continue with pantoprazole.         Subjective: Patient with continue to have cough and increased abdominal girth, no chest pain, very weak and deconditioned   Physical Exam: Vitals:   07/11/23 0036 07/11/23 0538 07/11/23 0808 07/11/23 1107  BP: 109/83 106/83 113/87 96/66  Pulse: 83 80 82 78  Resp: 16 15 17 18   Temp: 98.2 F (36.8 C) 98.1 F (36.7 C) 98 F (36.7 C) 98.3 F (36.8 C)  TempSrc: Oral Oral Oral Oral  SpO2: 100% 98% 100% 95%  Weight:  48.7 kg    Height:       Neurology awake and alert ENT With mild pallor Cardiovascular with S1 and S2 present and regular  with no gallops or rubs, positive systolic murmur at the right sternal border Mild JVD Abdomen with mild distention  No lower extremity edema Respiratory with scattered rales with no wheezing or rhonchi  Data Reviewed:    Family Communication: no family at the bedside   Disposition: Status is: Inpatient Remains inpatient appropriate because: volume overload   Planned Discharge Destination:  Home     Author: Coralie Keens, MD 07/11/2023 1:57 PM  For on call review [email or web address].

## 2023-07-11 NOTE — Plan of Care (Signed)

## 2023-07-12 DIAGNOSIS — I5023 Acute on chronic systolic (congestive) heart failure: Secondary | ICD-10-CM | POA: Diagnosis not present

## 2023-07-12 DIAGNOSIS — N184 Chronic kidney disease, stage 4 (severe): Secondary | ICD-10-CM | POA: Diagnosis not present

## 2023-07-12 DIAGNOSIS — M0579 Rheumatoid arthritis with rheumatoid factor of multiple sites without organ or systems involvement: Secondary | ICD-10-CM | POA: Diagnosis not present

## 2023-07-12 DIAGNOSIS — I10 Essential (primary) hypertension: Secondary | ICD-10-CM | POA: Diagnosis not present

## 2023-07-12 LAB — BASIC METABOLIC PANEL
Anion gap: 9 (ref 5–15)
BUN: 69 mg/dL — ABNORMAL HIGH (ref 8–23)
CO2: 23 mmol/L (ref 22–32)
Calcium: 9.7 mg/dL (ref 8.9–10.3)
Chloride: 106 mmol/L (ref 98–111)
Creatinine, Ser: 3.68 mg/dL — ABNORMAL HIGH (ref 0.44–1.00)
GFR, Estimated: 12 mL/min — ABNORMAL LOW (ref >60)
Glucose, Bld: 115 mg/dL — ABNORMAL HIGH (ref 70–99)
Potassium: 4.1 mmol/L (ref 3.5–5.1)
Sodium: 138 mmol/L (ref 135–145)

## 2023-07-12 LAB — MAGNESIUM: Magnesium: 2.3 mg/dL (ref 1.7–2.4)

## 2023-07-12 MED ORDER — TORSEMIDE 20 MG PO TABS
40.0000 mg | ORAL_TABLET | Freq: Every evening | ORAL | Status: DC
  Administered 2023-07-12 – 2023-07-13 (×2): 40 mg via ORAL
  Filled 2023-07-12 (×2): qty 2

## 2023-07-12 MED ORDER — TORSEMIDE 20 MG PO TABS
60.0000 mg | ORAL_TABLET | Freq: Every day | ORAL | Status: DC
  Administered 2023-07-13: 60 mg via ORAL
  Filled 2023-07-12: qty 3

## 2023-07-12 MED ORDER — BUMETANIDE 0.5 MG PO TABS
0.5000 mg | ORAL_TABLET | Freq: Once | ORAL | Status: DC
Start: 2023-07-12 — End: 2023-07-12
  Filled 2023-07-12: qty 1

## 2023-07-12 MED ORDER — BUMETANIDE 2 MG PO TABS: 2.0000 mg | ORAL_TABLET | Freq: Every day | ORAL | Status: DC

## 2023-07-12 NOTE — Plan of Care (Signed)
  Problem: Coping: Goal: Level of anxiety will decrease Outcome: Progressing   Problem: Pain Management: Goal: General experience of comfort will improve Outcome: Progressing   Problem: Safety: Goal: Ability to remain free from injury will improve Outcome: Progressing

## 2023-07-12 NOTE — TOC Initial Note (Signed)
Transition of Care [facility]) - Initial/Assessment Note    Patient Details  Name: Tracy Nelson MRN: [id number] Date of Birth: Nov 16, 1945  Transition of Care [facility]) CM/SW Contact:    Nicanor Bake Phone Number: [phone] 07/12/2023, 2:32 PM  Clinical Narrative:   HF CSW met with pt at bedside. Pt stated that she lives with adult children and grandchildren. Pt stated that she has history of HH services. Pt uses a rollator. Pt has a scale at home, but stated that she needs a more updated one. CSW explained to the pt to contact her insurance provider for a scale that is covered through insurance.   CSW explained that a hospital follow up appointment will be scheduled closer to dc. Pt agrees. Pt stated that she needed a bedside commode ordered. Pt stated at dc her daughter will provide transportation home.   CSW called tand attempted to schedule pts hospital follow up appointment but left a VM asking to be called back.   TOC will continue following.               Expected Discharge Plan: Home/Self Care Barriers to Discharge: Continued Medical Work up   Patient Goals and CMS Choice Patient states their goals for this hospitalization and ongoing recovery are:: return home          Expected Discharge Plan and Services       Living arrangements for the past 2 months: Single Family Home                                      Prior Living Arrangements/Services Living arrangements for the past 2 months: Single Family Home Lives with:: Adult Children, Relatives Patient language and need for interpreter reviewed:: Yes Do you feel safe going back to the place where you live?: Yes      Need for Family Participation in Patient Care: No (Comment) Care giver support system in place?: No (comment)   Criminal Activity/Legal Involvement Pertinent to Current Situation/Hospitalization: No - Comment as needed  Activities of Daily Living   ADL Screening (condition at time of  admission) Independently performs ADLs?: Yes (appropriate for developmental age) Is the patient deaf or have difficulty hearing?: No Does the patient have difficulty seeing, even when wearing glasses/contacts?: No Does the patient have difficulty concentrating, remembering, or making decisions?: No  Permission Sought/Granted                  Emotional Assessment Appearance:: Appears stated age Attitude/Demeanor/Rapport: Engaged Affect (typically observed): Appropriate Orientation: : Oriented to Self, Oriented to Place, Oriented to  Time, Oriented to Situation Alcohol / Substance Use: Not Applicable Psych Involvement: No (comment)  Admission diagnosis:  CHF exacerbation (HCC) [I50.9] Acute congestive heart failure, unspecified heart failure type (HCC) [I50.9] Acute clinical systolic heart failure (HCC) [I50.21] Patient Active Problem List   Diagnosis Date Noted   Acute clinical systolic heart failure (HCC) 07/10/2023   Acute on chronic systolic CHF (congestive heart failure) (HCC) 07/10/2023   CHF exacerbation (HCC) 07/09/2023   Elevated troponin 07/09/2023   PAF (paroxysmal atrial fibrillation) (HCC) 06/08/2023   Influenza vaccination declined 06/08/2023   Herpes zoster vaccination declined 06/08/2023   COVID-19 vaccination declined 06/08/2023   Bumps on skin 02/03/2023   Acquired thrombophilia (HCC) 02/03/2023   Prediabetes 01/13/2023   Prolonged QT interval 10/30/2022   Cataract 09/14/2022   Uterine prolapse 09/14/2022  Pressure ulcer 09/03/2022   Acute heart failure with reduced ejection fraction (HFrEF, <= 40%) (HCC) 08/30/2022   Anemia of chronic disease 08/30/2022   Chronic systolic CHF (congestive heart failure) (HCC) 07/13/2022   CKD (chronic kidney disease), stage IV (HCC) 07/09/2022   Corneal melt, 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   Monoclonal gammopathy 03/10/2021   Atypical chest pain 10/22/2020   CKD (chronic kidney disease), stage III (HCC) 10/22/2020   Atrial  flutter (HCC) 10/18/2020   Arthritis 07/15/2020   GERD (gastroesophageal reflux disease) 07/15/2020   Non-ischemic cardiomyopathy (HCC) 07/15/2020   HFrEF (heart failure with reduced ejection fraction) (HCC) 07/15/2020   HTN (hypertension) 07/15/2020   Osteoporosis 07/15/2020   Raynaud's disease 05/05/2019   Rheumatoid arthritis involving multiple sites with positive rheumatoid factor (HCC) 10/28/2015   Mitral regurgitation 05/30/2014   Vitamin D deficiency 05/15/2013   Encounter for long-term (current) use of other medications 12/14/2008   Sjogren's syndrome (HCC) 12/14/2008   PCP:  [name], [name] Pharmacy:   [facility] [phone] - [address], [place] - [address] [place] [address] Phone: [phone] Fax: [phone]  [facility] [address] [place] [address] Phone: [phone] Fax: [phone]  [facility] [address] [place] [address] Phone: [phone] Fax: [phone]     Social Determinants of Health (SDOH) Social History: SDOH Screenings   Food Insecurity: No Food Insecurity (07/09/2023)  Housing: Low Risk  (07/09/2023)  Transportation Needs: Unmet Transportation Needs (07/09/2023)  Utilities: Not At Risk (07/09/2023)  Alcohol Screen: Low Risk  (06/03/2023)  Depression (PHQ2-9): Low Risk  (06/03/2023)  Financial Resource Strain: Low Risk  (06/03/2023)  Physical Activity: Inactive (06/03/2023)  Social Connections: Moderately Isolated (06/03/2023)  Stress: No Stress Concern Present (06/03/2023)  Tobacco Use: Low Risk  (07/09/2023)  Health Literacy: Adequate Health Literacy (06/03/2023)   SDOH Interventions:     Readmission Risk Interventions    08/31/2022   12:35 PM  Readmission Risk Prevention Plan  Transportation Screening Complete  HRI or Home Care Consult Complete  Palliative Care Screening Not Applicable  Medication Review (RN Care  Manager) Complete

## 2023-07-12 NOTE — Progress Notes (Signed)
Occupational Therapy Treatment Patient Details Name: Tracy Nelson MRN: [id number] DOB: 1945/10/22 Today's Date: 07/12/2023   History of present illness Pt is a 77 y.o. female who presented to [facility] with c/o SOB and BLE edema. Admitted w/ acute HFrEF. PMH includes chronic systolic CHF, mitral regurgitation, tricuspid regurgitation, chronic anemia, PAF on Eliquis, nonischemic cardiomyopathy, RA, CKD IV, recent IM nail hip 07/10/22, osteoporosis, HTN.   OT comments  With increased encouragement pt completing OOB mobility with CGA, continues to display decreased activity tolerance. She declined attempts to perform hallway ambulation but 2 bouts of room level ambulation seemed to have fatigued her a bit. BLE swelling has gone down significantly. OT to continue to progress pt as able, DC plans remain appropriate for [facility].       If plan is discharge home, recommend the following:  A little help with bathing/dressing/bathroom;Assistance with cooking/housework   Equipment Recommendations  None recommended by OT (pt has rec DME. BSC for home in room)    Recommendations for Other Services      Precautions / Restrictions Precautions Precautions: Fall Restrictions Weight Bearing Restrictions: No       Mobility Bed Mobility Overal bed mobility: Needs Assistance Bed Mobility: Supine to Sit, Sit to Supine     Supine to sit: Contact guard, Used rails Sit to supine: Contact guard assist   General bed mobility comments: increased time    Transfers Overall transfer level: Needs assistance Equipment used: Rolling walker (2 wheels) Transfers: Sit to/from Stand Sit to Stand: Supervision           General transfer comment: supervision for safety     Balance Overall balance assessment: Needs assistance Sitting-balance support: No upper extremity supported, Feet supported Sitting balance-Leahy Scale: Good     Standing balance support: Bilateral upper extremity supported, Reliant on  assistive device for balance, During functional activity Standing balance-Leahy Scale: Poor Standing balance comment: RW support                           ADL either performed or assessed with clinical judgement   ADL               Lower Body Bathing: Sitting/lateral leans;Set up Lower Body Bathing Details (indicate cue type and reason): applied lotion to BLEs     Lower Body Dressing: Sitting/lateral leans;Moderate assistance Lower Body Dressing Details (indicate cue type and reason): assist needed to doff/don socks             Functional mobility during ADLs: Contact guard assist;Rolling walker (2 wheels) General ADL Comments: Pt declined need for ADLs at this time, encouraged her to perform some at sink but she wanted to get them done later    [facility] Assessment              Vision       Perception     Praxis      Cognition Arousal: Alert Behavior During Therapy: [facility] for tasks assessed/performed Overall Cognitive Status: Within Functional Limits for tasks assessed                                 General Comments: Pt hard to convince to push herself in therapy        Exercises      Shoulder Instructions       General Comments VSS on RA, pt with c/o fatigue  after walking to doorway and back twice. She states "Feels like I ran a marathon." Encouraged pt to walk out room more with chair follow but she continued declining advances throughout session    Pertinent Vitals/ Pain       Pain Assessment Pain Assessment: Faces Faces Pain Scale: Hurts a little bit Pain Location: toes due to one longer toenail scratching the other Pain Descriptors / Indicators: Discomfort Pain Intervention(s): Monitored during session, Repositioned, Other (comment) (applied bandage to overgrown toenail)  Home Living                                          Prior Functioning/Environment              Frequency  Min  1X/week        Progress Toward Goals  OT Goals(current goals can now be found in the care plan section)  Progress towards OT goals: Progressing toward goals  Acute Rehab OT Goals Patient Stated Goal: to get fluid off OT Goal Formulation: With patient Time For Goal Achievement: 07/23/23 Potential to Achieve Goals: Good  Plan      Co-evaluation                 AM-PAC OT "6 Clicks" Daily Activity     Outcome Measure   Help from another person eating meals?: None Help from another person taking care of personal grooming?: A Little Help from another person toileting, which includes using toliet, bedpan, or urinal?: A Little Help from another person bathing (including washing, rinsing, drying)?: A Little Help from another person to put on and taking off regular upper body clothing?: A Little Help from another person to put on and taking off regular lower body clothing?: A Lot 6 Click Score: 18    End of Session Equipment Utilized During Treatment: Gait belt;Rolling walker (2 wheels)  OT Visit Diagnosis: Unsteadiness on feet (R26.81);Muscle weakness (generalized) (M62.81);Other (comment)   Activity Tolerance Patient tolerated treatment well   Patient Left in bed;with call bell/phone within reach;with bed alarm set   Nurse Communication Mobility status        Time: 5621-3086 OT Time Calculation (min): 27 min  Charges: OT General Charges $OT Visit: 1 Visit OT Treatments $Therapeutic Activity: 23-37 mins  07/12/2023  AB, OTR/L  Acute Rehabilitation Services  Office: [phone]   Tristan Schroeder 07/12/2023, 5:46 PM

## 2023-07-12 NOTE — Consult Note (Signed)
[facility] Liaison Consult Note  07/12/2023  Tracy Nelson 1946-01-05 [id number]  This patient review for patient admitted to [facility] and met at the bedside.  Primary Care Provider:  [name], [name], with [facility], this provider is listed to provide the community transition of care follow up and Transition of Care calls  Insurance: [facility] PPO  Patient has been outreached for transportation in October.  The community based plan of care has focused on disease management and community resource support.  Patient endorses PCP, however she states she is still having difficulty with getting to appointments at times.  She states she is unsure of where the application process for the transportation is. She also would like food resource information as well. Patient closed eyes in mid conversation noted.  Patient will receive a post hospital call and will be evaluated for assessments and disease process education.    Plan: Continue to follow for any additional community care coordination needs for post hospital/community needs. Will make referrals as known closer to transition date from the hospital.   Of note, [facility] services does not replace or interfere with any services that are needed or arranged by inpatient [facility] care management team.   Charlesetta Shanks, RN, BSN, CCM [place]  [facility] Liaison Direct Dial: [phone] or secure chat Email: [email or web address]

## 2023-07-12 NOTE — Progress Notes (Signed)
Heart Failure Navigator Progress Note  Assessed for Heart & Vascular TOC clinic readiness.  Patient does not meet criteria due to Advanced Heart Failure Team consulted. .   Navigator will sign off at this time.   Dawn Fields, BSN, RN Heart Failure Nurse Navigator Secure Chat Only   

## 2023-07-12 NOTE — TOC Progression Note (Signed)
Transition of Care [facility]) - Progression Note    Patient Details  Name: Tracy Nelson MRN: [id number] Date of Birth: Feb 10, 1946  Transition of Care [facility]) CM/SW Contact  Leone Haven, RN Phone Number: 07/12/2023, 3:10 PM  Clinical Narrative:    NCM offered choice for  HHRN, HHPT, HHOT and BSC she does not have a preference, NCM made referral to [facility] with [employer] for [facility], this will be brought up to her room. Patient has had [facility] in the past and [facility], she states she would like [facility].  NCM made referral to [facility] , she is able to take referral for HHRN, HHPT, HHOT.  Soc will begin 24 to 48 hrs post dc.    Expected Discharge Plan: Home w Home Health Services Barriers to Discharge: Continued Medical Work up  Expected Discharge Plan and Services In-house Referral: NA Discharge Planning Services: CM Consult Post Acute Care Choice: Durable Medical Equipment, Home Health Living arrangements for the past 2 months: Single Family Home                 DME Arranged: Bedside commode DME Agency: NA       HH Arranged: PT, OT, RN HH Agency: [facility] Date HH Agency Contacted: 07/12/23 Time HH Agency Contacted: [id number] Representative spoke with at [facility] Agency: Tresa Endo   Social Determinants of Health (SDOH) Interventions SDOH Screenings   Food Insecurity: No Food Insecurity (07/09/2023)  Housing: Low Risk  (07/09/2023)  Transportation Needs: Unmet Transportation Needs (07/09/2023)  Utilities: Not At Risk (07/09/2023)  Alcohol Screen: Low Risk  (06/03/2023)  Depression (PHQ2-9): Low Risk  (06/03/2023)  Financial Resource Strain: Low Risk  (06/03/2023)  Physical Activity: Inactive (06/03/2023)  Social Connections: Moderately Isolated (06/03/2023)  Stress: No Stress Concern Present (06/03/2023)  Tobacco Use: Low Risk  (07/09/2023)  Health Literacy: Adequate Health Literacy (06/03/2023)    Readmission Risk Interventions    08/31/2022   12:35 PM   Readmission Risk Prevention Plan  Transportation Screening Complete  HRI or Home Care Consult Complete  Palliative Care Screening Not Applicable  Medication Review (RN Care Manager) Complete

## 2023-07-12 NOTE — Progress Notes (Signed)
Progress Note   Patient: Tracy Nelson KGM:[id number] DOB: [birth year] DOA: 07/09/2023     2 DOS: the patient was seen and examined on 07/12/2023   Brief hospital course: Tracy Nelson was admitted to the hospital with the working diagnosis of heart failure exacerbation.   77 yo female with the past medical history of heart failure, CKD, rheumatoid arthritis, and atrial fibrillation who presented with dyspnea. Reported worsening dyspnea for the last 2 weeks. She was evaluated as outpatient at the heart failure clinic, she received sq furosemide and oral bumetanide with improvement in her edema, but with no resolution of her symptoms. Because she continue to have dyspnea she presented to the ED. On her initial physical examination her blood pressure was 103/72, HR 85, RR 20 and 02 saturation 100%, lungs with no wheezing, or rhonchi, heart with S1 and S2 present and regular with no gallops, rubs or murmurs, abdomen with no distention, positive lower extremity edema.   EKG 86 bpm, left axis deviation, normal intervals, sinus rhythm with poor R R wave progression, with no significant ST segment or T wave changes.   Patient has been placed on IV furosemide for diuresis.   12/01 continue volume overloaded. Poor prognosis.  12/02 continue aggressive diuresis, will consult palliative care.   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   Documented urine output is 1,050 cc Systolic blood pressure is 98 to 100 mmHg.    Continue diuresis with torsemide 100 mg per day ( 60 mg in am and 40 mg pm).  Limited pharmacologic options due to reduced GFR and risk of hypotension.  Patient with end stage heart failure, will consult palliative care.   CKD (chronic kidney disease), stage IV (HCC) Today renal function with serum cr at 3,68 with K at 4,1 and serum bicarbonate at 23.  Na 138 Mg 2.3   Continue torsemide  Follow up renal function in am.  Anemia of chronic renal disease. Cell count has been stable.    Metabolic bone disease, continue with calcitriol.   HTN (hypertension) Patient with low blood pressure.   Atrial flutter (HCC) Currently rate controlled, sinus rhythm.  Continue anticoagulation with apixaban, renal dose to 2.5 mg daily   Rheumatoid arthritis involving multiple sites with positive rheumatoid factor (HCC) Sjogren's syndrome.   Continue with home dose of prednisone 5 mg No current indication for stress doses.   GERD (gastroesophageal reflux disease) Continue with pantoprazole.         Subjective: Patient with no chest pain, dyspnea has been improving, continue very weak and deconditioned   Physical Exam: Vitals:   07/12/23 0334 07/12/23 0424 07/12/23 0822 07/12/23 1043  BP:  102/72 94/64 100/78  Pulse:  79 81   Resp:  18 18 19   Temp:  98.1 F (36.7 C) 98.3 F (36.8 C) 98.2 F (36.8 C)  TempSrc:  Oral Oral Oral  SpO2:  97% 98%   Weight: 48.1 kg     Height:       Neurology awake and alert ENT with mild pallor Cardiovascular with S1 and S2 present and  regular with no gallops, rubs or murmurs Mild to moderate JVD No lower extremity edema Respiratory with rales at bases with no wheezing or rhonchi Abdomen with mild distention with no ascites.  Data Reviewed:    Family Communication: no family at the bedside   Disposition: Status is: Inpatient Remains inpatient appropriate because: heart failure   Planned Discharge Destination:  Home      Author: Coralie Keens, MD 07/12/2023 2:00 PM  For on call review [email or web address].

## 2023-07-12 NOTE — Plan of Care (Signed)
°  Problem: Clinical Measurements: °Goal: Cardiovascular complication will be avoided °Outcome: Progressing °  °Problem: Activity: °Goal: Risk for activity intolerance will decrease °Outcome: Progressing °  °

## 2023-07-12 NOTE — Progress Notes (Addendum)
Advanced Heart Failure Rounding Note  PCP-Cardiologist: None   Subjective:   Admitted 11/30 with A/C systolic HF  Has diuresed about 5lbs.   Feels ok, still gets SOB every now and then. Has been ambulating in the room, feels a little weak.   Objective:   Weight Range: 48.1 kg Body mass index is 18.78 kg/m.   Vital Signs:   Temp:  [98.1 F (36.7 C)-98.5 F (36.9 C)] 98.2 F (36.8 C) (12/02 1043) Pulse Rate:  [78-85] 81 (12/02 0822) Resp:  [17-19] 19 (12/02 1043) BP: (94-107)/(64-81) 100/78 (12/02 1043) SpO2:  [95 %-98 %] 98 % (12/02 0822) Weight:  [48.1 kg] 48.1 kg (12/02 0334) Last BM Date : 07/11/23  Weight change: Filed Weights   07/10/23 0556 07/11/23 0538 07/12/23 0334  Weight: 50.6 kg 48.7 kg 48.1 kg    Intake/Output:   Intake/Output Summary (Last 24 hours) at 07/12/2023 1101 Last data filed at 07/12/2023 0851 Gross per 24 hour  Intake 960 ml  Output 1650 ml  Net -690 ml      Physical Exam  General:  frail appearing.  No respiratory difficulty HEENT: normal Neck: supple. JVD ~12 cm. Carotids 2+ bilat; no bruits. No lymphadenopathy or thyromegaly appreciated. Cor: PMI nondisplaced. Regular rate & rhythm. No rubs, gallops or murmurs. Lungs: clear Abdomen: soft, nontender, nondistended. No hepatosplenomegaly. No bruits or masses. Good bowel sounds. Extremities: no cyanosis, clubbing, rash, edema  Neuro: alert & oriented x 3, cranial nerves grossly intact. moves all 4 extremities w/o difficulty. Affect pleasant.   Telemetry   NSR 80s with intt PVCs (Personally reviewed)    EKG    NSR 86 bpm  Labs    CBC Recent Labs    07/10/23 0225  WBC 8.6  NEUTROABS 6.5  HGB 10.1*  HCT 32.6*  MCV 94.8  PLT 163   Basic Metabolic Panel Recent Labs    16/10/96 0320 07/12/23 0323  NA 137 138  K 4.4 4.1  CL 105 106  CO2 23 23  GLUCOSE 127* 115*  BUN 65* 69*  CREATININE 3.64* 3.68*  CALCIUM 10.0 9.7  MG 2.3 2.3   Liver Function Tests No  results for input(s): "AST", "ALT", "ALKPHOS", "BILITOT", "PROT", "ALBUMIN" in the last 72 hours. No results for input(s): "LIPASE", "AMYLASE" in the last 72 hours. Cardiac Enzymes No results for input(s): "CKTOTAL", "CKMB", "CKMBINDEX", "TROPONINI" in the last 72 hours.  BNP: BNP (last 3 results) Recent Labs    07/02/23 1415 07/07/23 1142 07/09/23 1045  BNP >4,500.0* >4,500.0* >4,500.0*    ProBNP (last 3 results) No results for input(s): "PROBNP" in the last 8760 hours.   D-Dimer No results for input(s): "DDIMER" in the last 72 hours. Hemoglobin A1C No results for input(s): "HGBA1C" in the last 72 hours. Fasting Lipid Panel No results for input(s): "CHOL", "HDL", "LDLCALC", "TRIG", "CHOLHDL", "LDLDIRECT" in the last 72 hours. Thyroid Function Tests Recent Labs    07/10/23 0225  TSH 2.343    Other results:   Imaging    No results found.   Medications:     Scheduled Medications:  amiodarone  200 mg Oral Daily   apixaban  2.5 mg Oral BID   bumetanide  1.5 mg Oral Daily   calcitRIOL  0.5 mcg Oral Daily   folic acid  1 mg Oral Daily   pantoprazole  40 mg Oral Daily   predniSONE  5 mg Oral Q breakfast   sodium chloride flush  3 mL Intravenous Q12H  Infusions:   PRN Medications: acetaminophen, ondansetron (ZOFRAN) IV, sodium chloride flush    Patient Profile   Tracy Nelson is a 77 y.o. with rheumatoid arthritis, CKD stage 3, and nonischemic cardiomyopathy. AHF to see for A/C systolic heart failure  Assessment/Plan   1. Acute on Chronic Systolic CHF: Nonischemic cardiomyopathy.  This has been known since 2015, cath in 2015 showed no coronary disease and cardiac MRI in 2015 showed no LGE.  Cause is uncertain, familial cardiomyopathy is a concern given nonischemic cardiomyopathy in her mother.  Cannot rule out remote viral myocarditis.Poor appetite and severe HF limitation on CPX in 3/22 is concerning, as is elevated creatinine (suspect cardiorenal  syndrome).  Low BP and elevated creatinine have limited her cardiac med regimen. RHC (1/22) showed low filling pressures and actually a relatively preserved cardiac index of 2.22.  PYP scan in 4/22 was equivocal, PYP scan repeated in 3/23 and likely negative.  Cardiac MRI in 10/22 showed mild LV dilation with EF 23%, normal RV size with RVEF 37%, ECV 34%, small area of subendocardial LGE in the mid inferolateral wall and small area of full thickness LGE in the basal inferior wall.   This was not suggestive of cardiac amyloidosis but possibly could be consistent with coronary embolism (though no LV thrombus visualized) versus prior myocarditis versus sarcoidosis. With renal failure, CHF, and atrial arrhythmias, we have assessed for cardiac amyloidosis.  PYP scan was equivocal in 4/22 for transthyretin cardiac amyloidosis.  It was repeated in 3/23 and likely negative.  [employer] gene testing for TTR mutations was negative. The cardiac MRI was not consistent with amyloidosis.  She has MGUS with monoclonal IgG paraprotein, but based on cMRI and slow progression, think AL amyloidosis is unlikely.  Most recent echo 3/24 with EF 20-25%, mild LV dilation, severely decreased RV systolic function, mild RV enlargement, severe biatrial enlargement, moderate-severe TR, moderate MR, IVC normal. She was admitted in 1/24 and required milrinone.  - Chronically NYHA class IIIb symptoms and CKD stage IV.  She is end-stage.  Not a candidate for advanced therapies.  - Volume mildly elevated. Increase bumex to 2 mg daily  - Off Verquvo with hypotension.  - Off dapagliflozin with rise in SCr, with low BP would not restart.   - Off spironolactone with elevated SCr and soft BP.  - Does not have an ICD, would not place with end stage HF.   2. CKD: Stage 4.  She is followed by [name].  - Most recent SCr 3.68. 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 3. Atrial fibrillation/flutter: S/p TEE-DCCV in 4/22. She is now on amiodarone.  She was in  atypical atrial flutter 10/24.  She does not tolerate atrial arrhythmias. - NSR today - Continue amiodarone 200 mg daily.  Recent LFTs and TSH normal.  - Continue Eliquis 5 mg bid. 4. Anemia: She is receiving iron infusions per nephrology. - Hgb stable 6. GOC: Plan to consult palliative today. Previously hesitant to follow up but more open to being followed by palliative care now.   Length of Stay: 2  Alen Bleacher, NP  07/12/2023, 11:01 AM  Advanced Heart Failure Team Pager [phone] (M-F; 7a - 5p)  Please contact [facility] for night-coverage after hours (5p -7a ) and weekends on [email or web address]   Patient seen with NP, agree with the above note.   She has remained in NSR on amiodarone but was admitted again with CHF exacerbation.  She has had some diuresis, now back on po diuretics. Creatinine  stable at 3.68.   General: NAD, frail.  Neck: JVP difficult, suspect mildly elevated, no thyromegaly or thyroid nodule.  Lungs: Clear to auscultation bilaterally with normal respiratory effort. CV: Nondisplaced PMI.  Heart regular S1/S2, no S3/S4, no murmur.  No peripheral edema.    Abdomen: Soft, nontender, no hepatosplenomegaly, no distention.  Skin: Intact without lesions or rashes.  Neurologic: Alert and oriented x 3.  Psych: Normal affect. Extremities: No clubbing or cyanosis.  HEENT: Normal.   Continue amiodarone and apixaban, remains in NSR.   Volume status difficult, she is very frail with end stage HF.  Has not tolerated GDMT due to low BP and CKD stage IV.   - I am going to switch her from Bumex to torsemide 60 qam/40 qpm to see if we can manage her volume better with torsemide.  - watch creatinine with change.   Not candidate for advanced therapies, end stage CHF.  Palliative care is appropriate.   Marca Ancona 07/12/2023 12:28 PM

## 2023-07-13 ENCOUNTER — Ambulatory Visit: Payer: Medicare PPO | Admitting: Podiatry

## 2023-07-13 DIAGNOSIS — Z7189 Other specified counseling: Secondary | ICD-10-CM

## 2023-07-13 DIAGNOSIS — Z515 Encounter for palliative care: Secondary | ICD-10-CM | POA: Diagnosis not present

## 2023-07-13 DIAGNOSIS — M0579 Rheumatoid arthritis with rheumatoid factor of multiple sites without organ or systems involvement: Secondary | ICD-10-CM | POA: Diagnosis not present

## 2023-07-13 DIAGNOSIS — I5023 Acute on chronic systolic (congestive) heart failure: Secondary | ICD-10-CM | POA: Diagnosis not present

## 2023-07-13 DIAGNOSIS — I10 Essential (primary) hypertension: Secondary | ICD-10-CM | POA: Diagnosis not present

## 2023-07-13 DIAGNOSIS — N184 Chronic kidney disease, stage 4 (severe): Secondary | ICD-10-CM | POA: Diagnosis not present

## 2023-07-13 LAB — MAGNESIUM: Magnesium: 2.2 mg/dL (ref 1.7–2.4)

## 2023-07-13 LAB — BASIC METABOLIC PANEL
Anion gap: 10 (ref 5–15)
BUN: 68 mg/dL — ABNORMAL HIGH (ref 8–23)
CO2: 24 mmol/L (ref 22–32)
Calcium: 10.1 mg/dL (ref 8.9–10.3)
Chloride: 104 mmol/L (ref 98–111)
Creatinine, Ser: 3.65 mg/dL — ABNORMAL HIGH (ref 0.44–1.00)
GFR, Estimated: 12 mL/min — ABNORMAL LOW (ref >60)
Glucose, Bld: 113 mg/dL — ABNORMAL HIGH (ref 70–99)
Potassium: 4.1 mmol/L (ref 3.5–5.1)
Sodium: 138 mmol/L (ref 135–145)

## 2023-07-13 MED ORDER — ORAL CARE MOUTH RINSE: 15.0000 mL | OROMUCOSAL | Status: DC | PRN

## 2023-07-13 NOTE — TOC Progression Note (Signed)
Transition of Care [facility]) - Progression Note    Patient Details  Name: Tracy Nelson MRN: [id number] Date of Birth: October 20, 1945  Transition of [facility] CM/SW Contact  Nicanor Bake Phone Number: [phone] 07/13/2023, 2:28 PM  Clinical Narrative:  HF CSW met with pt at bedside. Pt confirmed that her daughter will provide transportation home. Pt stated that her daughter works and gets off late and inquired about dc later in the day VS earlier so her daughter can pick her up. CSW stated that she will inform the HF team.   TOC will continue following.      Expected Discharge Plan: Home w Home Health Services Barriers to Discharge: Continued Medical Work up  Expected Discharge Plan and Services In-house Referral: NA Discharge Planning Services: CM Consult Post Acute Care Choice: Durable Medical Equipment, Home Health Living arrangements for the past 2 months: Single Family Home                 DME Arranged: Bedside commode DME Agency: NA       HH Arranged: PT, OT, RN HH Agency: [facility] Date HH Agency Contacted: 07/12/23 Time HH Agency Contacted: 1509 Representative spoke with at [facility] Agency: Tresa Endo   Social Determinants of Health (SDOH) Interventions SDOH Screenings   Food Insecurity: No Food Insecurity (07/09/2023)  Housing: Low Risk  (07/09/2023)  Transportation Needs: Unmet Transportation Needs (07/09/2023)  Utilities: Not At Risk (07/09/2023)  Alcohol Screen: Low Risk  (06/03/2023)  Depression (PHQ2-9): Low Risk  (06/03/2023)  Financial Resource Strain: Low Risk  (06/03/2023)  Physical Activity: Inactive (06/03/2023)  Social Connections: Moderately Isolated (06/03/2023)  Stress: No Stress Concern Present (06/03/2023)  Tobacco Use: Low Risk  (07/09/2023)  Health Literacy: Adequate Health Literacy (06/03/2023)    Readmission Risk Interventions    08/31/2022   12:35 PM  Readmission Risk Prevention Plan  Transportation Screening Complete   HRI or Home Care Consult Complete  Palliative Care Screening Not Applicable  Medication Review (RN Care Manager) Complete

## 2023-07-13 NOTE — Consult Note (Signed)
Palliative Medicine Inpatient Consult Note  Consulting Provider:  Coralie Keens, MD   Reason for consult:   Palliative Care Consult Services Palliative Medicine Consult  Reason for Consult? end stage heart failure   07/13/2023  HPI:  Per intake H&P --> Tracy Nelson is a 77 yo female with the past medical history of heart failure, CKD, rheumatoid arthritis, and atrial fibrillation who presented with dyspnea. Being treated for a heart failure exacerbation. Tracy Nelson has been seen by Palliative Care on multiple occasions in the past year and is followed by [facility] OP Palliative services though she has not been seen since June.   Clinical Assessment/Goals of Care:  *Please note that this is a verbal dictation therefore any spelling or grammatical errors are due to the "Dragon Medical One" system interpretation.  I have reviewed medical records including EPIC notes, labs and imaging, received report from bedside RN, assessed the patient who is lying in bed in NAD.    I met with Tracy Nelson to further discuss diagnosis prognosis, GOC, EOL wishes, disposition and options.   I introduced Palliative Medicine as specialized medical care for people living with serious illness. It focuses on providing relief from the symptoms and stress of a serious illness. The goal is to improve quality of life for both the patient and the family.  Medical History Review and Understanding:  Tracy Nelson and I have reviewed her past medical history significant for chronic kidney disease, rheumatoid arthritis, atrial fibrillation, heart failure, osteoporosis, and hypertension.  Social History:  Tracy Nelson shares with me that she is from [place], [place].  She shares that she moved here to be closer to her daughter and 3 grandchildren.  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   She is a woman who remains to keep busy with grant writing and activity within the community.  She shares that she still remains active with her sorority "[employer]".  She is a woman of faith and practices within the [facility] denomination.  Functional and Nutritional State:  Preceding hospitalization Merry lived with her daughter on her first floor in-law suite.  She mobilizes throughout the home with a rollator.  She shares that she is able to bathe and shower herself, dress herself, a friend helps with meal preparation, patient can feed herself.  She does have a good appetite and tries to maintain a heart healthy diet  Palliative Symptoms:  Patient has dyspnea at rest it comes and goes on it's own. She shares that she can feel short of breath on exertion but limits herself if needed. She has not needed oxygen at home.   Advance Directives:  A detailed discussion was had today regarding advanced directives.  Patient shares that she would like her daughter, Tracy Nelson to be her surrogate decision maker.  She does not have a living will and feels her daughter does not know what her wishes are.  Code Status:  Concepts specific to code status, artifical feeding and hydration, continued IV antibiotics and rehospitalization was had.  The difference between a aggressive medical intervention path  and a palliative comfort care path for this patient at this time was had.   Encouraged patient/family to consider DNR/DNI status understanding evidenced based poor outcomes in similar hospitalized patient, as the cause of arrest is likely associated with advanced chronic/terminal illness rather than an easily reversible acute cardio-pulmonary event. I  explained that DNR/DNI does not change the medical plan and it only comes into effect after a person has arrested (died).  It is a protective measure to keep [place] from harming the patient in their last moments of life.  At this time patient shares she wants to be full code  she expresses that she understands that she is in denial of her present health state.   Provided "Heard Choices" Book  Discussion:  We reviewed the severity of patients heart failure. We discussed that heart failure is progressive disease. We reviewed eventually even with comprehensive medical management it is still likely to worsen. Teirra understands this. She shares that she has had a hard time coming to grips with her present health. One of the providers had mentioned that she has a prognosis of roughly three months. She shares that this is too soon for her as her long term goal is to see her grandsons and granddaughter graduate.   I allowed time for Tracy Nelson to express her feeling in relation to her poor prognosis. We discussed that eventually medication will not be able to stall disease progression.   We reviewed that Tracy Nelson respects [name] greatly and feels if her time is limited it would be beneficial to get these insights from him directly. I shared that I would pass this along.  We reviewed that as an outpatient, Sarenna has not felt well supported by [facility]. She and I reviewed transitioning her OP Palliative support to another agency which she was agreeable towards.   Plan for continue care at this point  Discussed the importance of continued conversation with family and their  medical providers regarding overall plan of care and treatment options, ensuring decisions are within the context of the patients values and GOCs.  Decision Maker: Tracy Nelson,Tracy Nelson (Daughter):[phone] (Home Phone)   SUMMARY OF RECOMMENDATIONS   Full Code  Open and honest conversations held in the setting of patient heart failure and her overall prognosis  Discussed the grief process and hoe Tracy Nelson is struggling with a loss or "self" she feels that she is very much in the denial phase right now  Patients goals are ideally to see her grandchildren graduate from highschool though she realizes this  is not likely  Appreciate TOC helping patient to enroll in Palliative services through [facility]  Appreciate Cardiology team making a referral to the Paramedicine program --> Patient shares a strong rapport with [name] and willingness to hear and possibly accept his thoughts on her prognosis  Ongoing PMT support  Code Status/Advance Care Planning: FULL CODE   Symptom Management:  Dyspnea: - Diuresis per primary  Palliative Prophylaxis:  Aspiration, Bowel Regimen, Delirium Protocol, Frequent Pain Assessment, Oral Care, Palliative Wound Care, and Turn Reposition  Additional Recommendations (Limitations, Scope, Preferences): Continue current care  Psycho-social/Spiritual:  Desire for further Chaplaincy support: Yes Additional Recommendations: Education on HF progression   Prognosis: Limited overall.   Discharge Planning: Discharge plan is to be determined.  Vitals:   07/13/23 0029 07/13/23 0422  BP: 105/78 106/76  Pulse: 83 77  Resp: 18 17  Temp: 98.2 F (36.8 C) 98.2 F (36.8 C)  SpO2: 94% 94%    Intake/Output Summary (Last 24 hours) at 07/13/2023 0742 Last data filed at 07/13/2023 0423 Gross per 24 hour  Intake 780 ml  Output 2050 ml  Net -1270 ml   Last Weight  Most recent update: 07/13/2023  5:23 AM    Weight  47.5 kg (104 lb  11.5 oz)            Gen: Elderly AA F in NAD HEENT: moist mucous membranes CV: Regular rate and irregular rhythm  PULM:  On RA, breathing is even and nonlabored ABD: soft/nontender  EXT: No edema  Neuro: Alert and oriented x3   PPS: 50-60%   This conversation/these recommendations were discussed with patient primary care team, Dr. Ella Jubilee  Total Time: 21 Billing based on MDM: High  Problems Addressed: One acute or chronic illness or injury that poses a threat to life or bodily function  Amount and/or Complexity of Data: Category 3:Discussion of management or test interpretation with external physician/other  qualified health care professional/appropriate source (not separately reported)  Risks: Decision regarding hospitalization or escalation of hospital care ______________________________________________________ Lamarr Lulas [address] [facility] Palliative Medicine Team Team Cell Phone: [phone] Please utilize secure chat with additional questions, if there is no response within 30 minutes please call the above phone number  Palliative Medicine Team providers are available by phone from 7am to 7pm daily and can be reached through the team cell phone.  Should this patient require assistance outside of these hours, please call the patient's attending physician.

## 2023-07-13 NOTE — Progress Notes (Signed)
PROGRESS NOTE    Tracy Nelson  NFA:[id number] DOB: [birth year] DOA: 07/09/2023 PCP: [name], [name]  77/F w systolic CHF, CKD, rheumatoid arthritis, and atrial fibrillation who presented with dyspnea. Reported worsening dyspnea for the last 2 weeks. She was evaluated as outpatient at the heart failure clinic, she received sq furosemide and oral bumetanide then presented to the ED. VSS, + edema CXR w with cardiomegaly, with bilateral hilar vascular congestion with cephalization of the vasculature.   -placed on IV furosemide for diuresis.    12/1  volume overloaded. Poor prognosis.  12/2  aggressive diuresis, will consult palliative care.  12/3 patient with improvement in volume status with diuretic therapy, she has been transitioned to torsemide. Palliative care services consulted. She will benefit from hospice services.  12/4: creat higher, torsemide held   Subjective: feels so -so, intermittent dyspnea  Assessment and Plan:  Acute on chronic systolic CHF, BiV Failure Pulm HTN -Echo with EF 20 to 25 %, global hypokinesis, mild dilatated internal cavity, RV systolic function with severe reduction, moderate mitral valve regurgitation, moderate to severe tricuspid valve regurgitation, mild to moderate aortic valve regurgitation. RVSP 34.8 mmHg  -diuresed w/ IV lasix, changed to torsemide, now held w/ worsening creat Limited pharmacologic options due to reduced GFR and risk of hypotension.  Patient with end stage heart failure, palliative consulted, pt is not ready for Hospice yet, high risk of quick re-admission, wishes to remain full code  AKI on CKD 4 -creat higher, torsemide held today Follow up renal function in am.  Anemia of chronic renal disease. Cell count has been stable.    Metabolic bone disease, continue with calcitriol.   HTN (hypertension) Soft, stable  Atrial flutter (HCC) Currently rate controlled, sinus rhythm.  Continue anticoagulation with apixaban, renal  dose to 2.5 mg daily   Rheumatoid arthritis involving multiple sites with positive rheumatoid factor (HCC) Sjogren's syndrome.  Continue with home dose of prednisone 5 mg No current indication for stress doses.   GERD (gastroesophageal reflux disease) Continue with pantoprazole.    DVT prophylaxis: apixaban Code Status: Full Family Communication: none present Disposition Plan: ? Home tomorrow  Consultants:    Procedures:   Antimicrobials:    Objective: Vitals:   07/13/23 0523 07/13/23 0802 07/13/23 1240 07/13/23 1523  BP:  93/74 114/86 97/68  Pulse:  89 87 81  Resp:  16 20 20   Temp:  98 F (36.7 C) 98.2 F (36.8 C) 98.2 F (36.8 C)  TempSrc:  Oral Oral Oral  SpO2:  99% 99% 97%  Weight: 47.5 kg     Height:        Intake/Output Summary (Last 24 hours) at 07/13/2023 1744 Last data filed at 07/13/2023 1523 Gross per 24 hour  Intake 600 ml  Output 1750 ml  Net -1150 ml   Filed Weights   07/11/23 0538 07/12/23 0334 07/13/23 0523  Weight: 48.7 kg 48.1 kg 47.5 kg    Examination:   Gen: Awake, Alert, Oriented X 3, chronically ill, Cushingoid facies HEENT: no JVD Lungs: Good air movement bilaterally, CTAB CVS: S1S2/RRR Abd: soft, Non tender, non distended, BS present Extremities: No edema Skin: no new rashes on exposed skin    Data Reviewed:   CBC: Recent Labs  Lab 07/09/23 1045 07/10/23 0225  WBC 9.1 8.6  NEUTROABS 7.8* 6.5  HGB 10.1* 10.1*  HCT 33.8* 32.6*  MCV 96.3 94.8  PLT 161 163   Basic Metabolic Panel: Recent Labs  Lab 07/09/23 1045 07/10/23  0225 07/11/23 0320 07/12/23 0323 07/13/23 0258  NA 141 140 137 138 138  K 4.5 4.2 4.4 4.1 4.1  CL 111 111 105 106 104  CO2 21* 22 23 23 24   GLUCOSE 151* 124* 127* 115* 113*  BUN 66* 66* 65* 69* 68*  CREATININE 3.45* 3.44* 3.64* 3.68* 3.65*  CALCIUM 9.7 10.0 10.0 9.7 10.1  MG  --   --  2.3 2.3 2.2   GFR: Estimated Creatinine Clearance: 9.7 mL/min (A) (by C-G formula based on SCr of 3.65  mg/dL (H)). Liver Function Tests: Recent Labs  Lab 07/09/23 1045  AST 20  ALT 24  ALKPHOS 77  BILITOT 0.7  PROT 5.8*  ALBUMIN 2.9*   No results for input(s): "LIPASE", "AMYLASE" in the last 168 hours. No results for input(s): "AMMONIA" in the last 168 hours. Coagulation Profile: No results for input(s): "INR", "PROTIME" in the last 168 hours. Cardiac Enzymes: No results for input(s): "CKTOTAL", "CKMB", "CKMBINDEX", "TROPONINI" in the last 168 hours. BNP (last 3 results) No results for input(s): "PROBNP" in the last 8760 hours. HbA1C: No results for input(s): "HGBA1C" in the last 72 hours. CBG: No results for input(s): "GLUCAP" in the last 168 hours. Lipid Profile: No results for input(s): "CHOL", "HDL", "LDLCALC", "TRIG", "CHOLHDL", "LDLDIRECT" in the last 72 hours. Thyroid Function Tests: No results for input(s): "TSH", "T4TOTAL", "FREET4", "T3FREE", "THYROIDAB" in the last 72 hours. Anemia Panel: No results for input(s): "VITAMINB12", "FOLATE", "FERRITIN", "TIBC", "IRON", "RETICCTPCT" in the last 72 hours. Urine analysis:    Component Value Date/Time   COLORURINE YELLOW 11/25/2022 1730   APPEARANCEUR CLEAR 11/25/2022 1730   LABSPEC 1.016 11/25/2022 1730   PHURINE 5.0 11/25/2022 1730   GLUCOSEU NEGATIVE 11/25/2022 1730   HGBUR NEGATIVE 11/25/2022 1730   BILIRUBINUR NEGATIVE 11/25/2022 1730   BILIRUBINUR negative 06/04/2021 1151   KETONESUR NEGATIVE 11/25/2022 1730   PROTEINUR TRACE (A) 11/25/2022 1730   UROBILINOGEN 0.2 06/04/2021 1151   NITRITE NEGATIVE 11/25/2022 1730   LEUKOCYTESUR MODERATE (A) 11/25/2022 1730   Sepsis Labs: @LABRCNTIP (procalcitonin:4,lacticidven:4)  )No results found for this or any previous visit (from the past 240 hour(s)).   Radiology Studies: No results found.   Scheduled Meds:  amiodarone  200 mg Oral Daily   apixaban  2.5 mg Oral BID   calcitRIOL  0.5 mcg Oral Daily   folic acid  1 mg Oral Daily   pantoprazole  40 mg Oral Daily    predniSONE  5 mg Oral Q breakfast   sodium chloride flush  3 mL Intravenous Q12H   torsemide  40 mg Oral QPM   torsemide  60 mg Oral Q breakfast   Continuous Infusions:   LOS: 3 days    Time spent:    Zannie Cove, MD [facility]   07/13/2023, 5:44 PM

## 2023-07-13 NOTE — Progress Notes (Signed)
Progress Note   Patient: Tracy Nelson:[id number] DOB: 16-Mar-1946 DOA: 07/09/2023     3 DOS: the patient was seen and examined on 07/13/2023   Brief hospital course: Tracy Nelson was admitted to the hospital with the working diagnosis of heart failure exacerbation.   77 yo female with the past medical history of heart failure, CKD, rheumatoid arthritis, and atrial fibrillation who presented with dyspnea. Reported worsening dyspnea for the last 2 weeks. She was evaluated as outpatient at the heart failure clinic, she received sq furosemide and oral bumetanide with improvement in her edema, but with no resolution of her symptoms. Because she continue to have dyspnea she presented to the ED. On her initial physical examination her blood pressure was 103/72, HR 85, RR 20 and 02 saturation 100%, lungs with no wheezing, or rhonchi, heart with S1 and S2 present and regular with no gallops, rubs or murmurs, abdomen with no distention, positive lower extremity edema.   Chest radiograph with cardiomegaly, with bilateral hilar vascular congestion with cephalization of the vasculature.   EKG 86 bpm, left axis deviation, normal intervals, sinus rhythm with poor R R wave progression, with no significant ST segment or T wave changes.   Patient has been placed on IV furosemide for diuresis.   12/01 continue volume overloaded. Poor prognosis.  12/02 continue aggressive diuresis, will consult palliative care.  12/03 patient with improvement in volume status with diuretic therapy, she has been transitioned to torsemide. Palliative care services consulted. She will benefit from hospice services.   Assessment and Plan: * Acute on chronic systolic CHF (congestive heart failure) (HCC) Echocardiogram with reduced LV systolic function with EF 20 to 25 %, global hypokinesis, mild dilatated internal cavity, RV systolic function with severe reduction, mild enlarged cavity, LA and RA with severe enlargement,  moderate mitral valve regurgitation, moderate to severe tricuspid valve regurgitation, mild to moderate aortic valve regurgitation. 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   Documented urine output is 2,050 cc Systolic blood pressure in the low 100's mmHg.   Continue diuresis with torsemide 100 mg per day ( 60 mg in am and 40 mg pm).  Limited pharmacologic options due to reduced GFR and risk of hypotension.  Patient with end stage heart failure, will consult palliative care.  Patient will benefit from hospice .   CKD (chronic kidney disease), stage IV (HCC) Today renal function with serum cr at 3,65 with K at 4,1 and serum bicarbonate at 24.  Na 138  Mg 2.2   Continue torsemide 60 and 40 mg daily.  Follow up renal function in am.  Anemia of chronic renal disease. Cell count has been stable.    Metabolic bone disease, continue with calcitriol.   HTN (hypertension) Patient with low blood pressure.   Atrial flutter (HCC) Currently rate controlled, sinus rhythm.  Continue anticoagulation with apixaban, renal dose to 2.5 mg daily   Rheumatoid arthritis involving multiple sites with positive rheumatoid factor (HCC) Sjogren's syndrome.   Continue with home dose of prednisone 5 mg No current indication for stress doses.   GERD (gastroesophageal reflux disease) Continue with pantoprazole.         Subjective: Patient with no chest pain, her dyspnea has been stable, she continue very weak and deconditioned.   Physical Exam: Vitals:   07/13/23 0523 07/13/23 0802 07/13/23 1240 07/13/23 1523  BP:  93/74 114/86 97/68  Pulse:  89 87 81  Resp:  16 20  20  Temp:  98 F (36.7 C) 98.2 F (36.8 C) 98.2 F (36.8 C)  TempSrc:  Oral Oral Oral  SpO2:  99% 99% 97%  Weight: 47.5 kg     Height:       Neurology awake and alert ENT with mild pallor Cardiovascular with S1 and S2 present, and regular with no gallops, positive systolic  murmur at the right lower sternal border Respiratory with rales bilaterally with no wheezing or rhonchi Abdomen with no distention  No peripheral edema  Data Reviewed:    Family Communication: no family at the bedside   Disposition: Status is: Inpatient Remains inpatient appropriate because: continue diuresis, possible discharge home with hospice or palliative care services   Planned Discharge Destination: Home    Author: Coralie Keens, MD 07/13/2023 3:40 PM  For on call review [email or web address].

## 2023-07-13 NOTE — Progress Notes (Addendum)
Advanced Heart Failure Rounding Note  PCP-Cardiologist: None   Subjective:   Admitted 11/30 with A/C systolic HF  Has diuresed about 7 lbs.   Feels ok, didn't sleep well last night.   Objective:   Weight Range: 47.5 kg Body mass index is 18.55 kg/m.   Vital Signs:   Temp:  [98 F (36.7 C)-98.3 F (36.8 C)] 98.2 F (36.8 C) (12/03 0422) Pulse Rate:  [77-83] 77 (12/03 0422) Resp:  [17-19] 17 (12/03 0422) BP: (94-106)/(64-78) 106/76 (12/03 0422) SpO2:  [94 %-98 %] 94 % (12/03 0422) Weight:  [47.5 kg] 47.5 kg (12/03 0523) Last BM Date : 07/11/23  Weight change: Filed Weights   07/11/23 0538 07/12/23 0334 07/13/23 0523  Weight: 48.7 kg 48.1 kg 47.5 kg   Intake/Output:   Intake/Output Summary (Last 24 hours) at 07/13/2023 0801 Last data filed at 07/13/2023 0423 Gross per 24 hour  Intake 780 ml  Output 2050 ml  Net -1270 ml    Physical Exam  General:  frail appearing.  No respiratory difficulty HEENT: normal Neck: supple. JVD ~8 cm. Carotids 2+ bilat; no bruits. No lymphadenopathy or thyromegaly appreciated. Cor: PMI nondisplaced. Regular rate & rhythm. No rubs, gallops or murmurs. Lungs: clear Abdomen: soft, nontender, nondistended. No hepatosplenomegaly. No bruits or masses. Good bowel sounds. Extremities: no cyanosis, clubbing, rash, edema  Neuro: alert & oriented x 3, cranial nerves grossly intact. moves all 4 extremities w/o difficulty. Affect pleasant.   Telemetry   NSR 90s (Personally reviewed)    EKG    No new EKG to review  Labs  CBC No results for input(s): "WBC", "NEUTROABS", "HGB", "HCT", "MCV", "PLT" in the last 72 hours.  Basic Metabolic Panel Recent Labs    60/45/40 0323 07/13/23 0258  NA 138 138  K 4.1 4.1  CL 106 104  CO2 23 24  GLUCOSE 115* 113*  BUN 69* 68*  CREATININE 3.68* 3.65*  CALCIUM 9.7 10.1  MG 2.3 2.2   Liver Function Tests No results for input(s): "AST", "ALT", "ALKPHOS", "BILITOT", "PROT", "ALBUMIN" in the  last 72 hours. No results for input(s): "LIPASE", "AMYLASE" in the last 72 hours. Cardiac Enzymes No results for input(s): "CKTOTAL", "CKMB", "CKMBINDEX", "TROPONINI" in the last 72 hours.  BNP: BNP (last 3 results) Recent Labs    07/02/23 1415 07/07/23 1142 07/09/23 1045  BNP >4,500.0* >4,500.0* >4,500.0*   ProBNP (last 3 results) No results for input(s): "PROBNP" in the last 8760 hours.  D-Dimer No results for input(s): "DDIMER" in the last 72 hours. Hemoglobin A1C No results for input(s): "HGBA1C" in the last 72 hours. Fasting Lipid Panel No results for input(s): "CHOL", "HDL", "LDLCALC", "TRIG", "CHOLHDL", "LDLDIRECT" in the last 72 hours. Thyroid Function Tests No results for input(s): "TSH", "T4TOTAL", "T3FREE", "THYROIDAB" in the last 72 hours.  Invalid input(s): "FREET3"  Other results:  Imaging   No results found.  Medications:   Scheduled Medications:  amiodarone  200 mg Oral Daily   apixaban  2.5 mg Oral BID   calcitRIOL  0.5 mcg Oral Daily   folic acid  1 mg Oral Daily   pantoprazole  40 mg Oral Daily   predniSONE  5 mg Oral Q breakfast   sodium chloride flush  3 mL Intravenous Q12H   torsemide  40 mg Oral QPM   torsemide  60 mg Oral Q breakfast   Infusions:   PRN Medications: acetaminophen, ondansetron (ZOFRAN) IV, sodium chloride flush  Patient Profile   Karrigan Sugawara  Corban is a 77 y.o. with rheumatoid arthritis, CKD stage 3, and nonischemic cardiomyopathy. AHF to see for A/C systolic heart failure  Assessment/Plan   1. Acute on Chronic Systolic CHF: Nonischemic cardiomyopathy.  This has been known since 2015, cath in 2015 showed no coronary disease and cardiac MRI in 2015 showed no LGE.  Cause is uncertain, familial cardiomyopathy is a concern given nonischemic cardiomyopathy in her mother.  Cannot rule out remote viral myocarditis.Poor appetite and severe HF limitation on CPX in 3/22 is concerning, as is elevated creatinine (suspect cardiorenal  syndrome).  Low BP and elevated creatinine have limited her cardiac med regimen. RHC (1/22) showed low filling pressures and actually a relatively preserved cardiac index of 2.22.  PYP scan in 4/22 was equivocal, PYP scan repeated in 3/23 and likely negative.  Cardiac MRI in 10/22 showed mild LV dilation with EF 23%, normal RV size with RVEF 37%, ECV 34%, small area of subendocardial LGE in the mid inferolateral wall and small area of full thickness LGE in the basal inferior wall.   This was not suggestive of cardiac amyloidosis but possibly could be consistent with coronary embolism (though no LV thrombus visualized) versus prior myocarditis versus sarcoidosis. With renal failure, CHF, and atrial arrhythmias, we have assessed for cardiac amyloidosis.  PYP scan was equivocal in 4/22 for transthyretin cardiac amyloidosis.  It was repeated in 3/23 and likely negative.  [employer] gene testing for TTR mutations was negative. The cardiac MRI was not consistent with amyloidosis.  She has MGUS with monoclonal IgG paraprotein, but based on cMRI and slow progression, think AL amyloidosis is unlikely.  Most recent echo 3/24 with EF 20-25%, mild LV dilation, severely decreased RV systolic function, mild RV enlargement, severe biatrial enlargement, moderate-severe TR, moderate MR, IVC normal. She was admitted in 1/24 and required milrinone.  - Chronically NYHA class IIIb symptoms and CKD stage IV.  She is end-stage.  Not a candidate for advanced therapies.  - Volume appears to be stabilizing. Diuretics switched to Torsemide 60/40 yesterday. Fair response.  - Off Verquvo with hypotension.  - Off dapagliflozin with rise in SCr, with low BP would not restart.   - Off spironolactone with elevated SCr and soft BP.  - Does not have an ICD, would not place with end stage HF.   2. CKD: Stage 4.  She is followed by [name].  - Most recent SCr 3.65. 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 3. Atrial fibrillation/flutter: S/p TEE-DCCV in  4/22. She is now on amiodarone.  She was in atypical atrial flutter 10/24.  She does not tolerate atrial arrhythmias. - NSR today - Continue amiodarone 200 mg daily.  Recent LFTs and TSH normal.  - Continue Eliquis 5 mg bid. 4. Anemia: She is receiving iron infusions per nephrology. - Hgb stable 6. GOC: Briefly discussed GOC with her today. We discussed her end stage HF and all she relayed to me was that she wants to keep trying. Formal palliative care team to follow later today (discussed this with them) , open to continue OP follow up.   Length of Stay: 3  Alen Bleacher, NP  07/13/2023, 8:01 AM  Advanced Heart Failure Team Pager [phone] (M-F; 7a - 5p)  Please contact [facility] for night-coverage after hours (5p -7a ) and weekends on [email or web address]   Patient seen with NP, agree with the above note.   Creatinine stable at 3.65 today.  Now on torsemide.  Has been walking to bathroom.   General: NAD,  frail.  Neck: No JVD, no thyromegaly or thyroid nodule.  Lungs: Clear to auscultation bilaterally with normal respiratory effort. CV: Nondisplaced PMI.  Heart regular S1/S2, no S3/S4, no murmur.  No peripheral edema.   Abdomen: Soft, nontender, no hepatosplenomegaly, no distention.  Skin: Intact without lesions or rashes.  Neurologic: Alert and oriented x 3.  Psych: Normal affect. Extremities: No clubbing or cyanosis.  HEENT: Normal.   Continue amiodarone and apixaban, remains in NSR.    She is very frail with end stage HF.  Has not tolerated GDMT due to low BP and CKD stage IV.  Volume status looks as good as we are going to get it. Creatinine has stabilized.  - Continue torsemide 60 qam/40 qpm.    Not candidate for advanced therapies, end stage CHF.  I told her that we will do the best we can to keep her out of the hospital but that we cannot make her heart better.  She is not ready for hospice yet.   Marca Ancona 07/13/2023 3:20 PM

## 2023-07-13 NOTE — Plan of Care (Signed)
  Problem: Education: Goal: Knowledge of General Education information will improve Description: Including pain rating scale, medication(s)/side effects and non-pharmacologic comfort measures Outcome: Progressing   Problem: Health Behavior/Discharge Planning: Goal: Ability to manage health-related needs will improve Outcome: Progressing   Problem: Nutrition: Goal: Adequate nutrition will be maintained Outcome: Progressing   

## 2023-07-13 NOTE — Care Management Important Message (Signed)
Important Message  Patient Details  Name: Tracy Nelson MRN: [id number] Date of Birth: 12/14/1945   Important Message Given:  Yes - Medicare IM     Dorena Bodo 07/13/2023, 4:03 PM

## 2023-07-13 NOTE — Progress Notes (Signed)
PT Cancellation Note  Patient Details Name: ADDELYNNE MENG MRN: [id number] DOB: 1945-10-28   Cancelled Treatment:    Reason Eval/Treat Not Completed: Other (comment) (pt feeling down about being told she would be going home with palliative care).   Lyanne Co, PT  Acute Rehab Services Secure chat preferred Office [phone]    Elyse Hsu 07/13/2023, 4:21 PM

## 2023-07-14 DIAGNOSIS — Z515 Encounter for palliative care: Secondary | ICD-10-CM | POA: Diagnosis not present

## 2023-07-14 DIAGNOSIS — I5023 Acute on chronic systolic (congestive) heart failure: Secondary | ICD-10-CM | POA: Diagnosis not present

## 2023-07-14 DIAGNOSIS — Z7189 Other specified counseling: Secondary | ICD-10-CM | POA: Diagnosis not present

## 2023-07-14 LAB — BASIC METABOLIC PANEL
Anion gap: 10 (ref 5–15)
BUN: 73 mg/dL — ABNORMAL HIGH (ref 8–23)
CO2: 26 mmol/L (ref 22–32)
Calcium: 9.7 mg/dL (ref 8.9–10.3)
Chloride: 101 mmol/L (ref 98–111)
Creatinine, Ser: 3.91 mg/dL — ABNORMAL HIGH (ref 0.44–1.00)
GFR, Estimated: 11 mL/min — ABNORMAL LOW (ref >60)
Glucose, Bld: 120 mg/dL — ABNORMAL HIGH (ref 70–99)
Potassium: 3.8 mmol/L (ref 3.5–5.1)
Sodium: 137 mmol/L (ref 135–145)

## 2023-07-14 NOTE — Progress Notes (Signed)
Physical Therapy Treatment Patient Details Name: Tracy Nelson MRN: [id number] DOB: 02-Sep-1945 Today's Date: 07/14/2023   History of Present Illness Pt is a 77 y.o. female who presented to [facility] with c/o SOB and BLE edema. Admitted w/ acute HFrEF. PMH includes chronic systolic CHF, mitral regurgitation, tricuspid regurgitation, chronic anemia, PAF on Eliquis, nonischemic cardiomyopathy, RA, CKD IV, recent IM nail hip 07/10/22, osteoporosis, HTN.    PT Comments  Pt was able to progress to ambulating an increased distance of up to ~120 ft today. However, she needs repeated cues for proper proximity to the RW and improved upright posture. She reports fatigue in her arms with prolonged standing and ambulating. Will continue to follow acutely.     If plan is discharge home, recommend the following: A little help with walking and/or transfers;Help with stairs or ramp for entrance;A little help with bathing/dressing/bathroom;Assistance with cooking/housework   Can travel by private vehicle        Equipment Recommendations  None recommended by PT    Recommendations for Other Services       Precautions / Restrictions Precautions Precautions: Fall Restrictions Weight Bearing Restrictions: No     Mobility  Bed Mobility Overal bed mobility: Needs Assistance Bed Mobility: Sit to Supine       Sit to supine: Supervision   General bed mobility comments: Supervision for safety    Transfers Overall transfer level: Needs assistance Equipment used: Rolling walker (2 wheels) Transfers: Sit to/from Stand Sit to Stand: Supervision           General transfer comment: supervision for safety    Ambulation/Gait Ambulation/Gait assistance: Contact guard assist Gait Distance (Feet): 120 Feet Assistive device: Rolling walker (2 wheels) Gait Pattern/deviations: Step-through pattern, Decreased stride length, Trunk flexed Gait velocity: reduced Gait velocity interpretation: <1.8 ft/sec,  indicate of risk for recurrent falls   General Gait Details: Pt needed repeated verbal, visual, and tactile cues to stand upright. Cues provided also for improvded proximity to RW, mod success noted. No LOB, CGA for safety. Pt declined to ambulate in halls, thus ambulated laps in room.   Stairs             Wheelchair Mobility     Tilt Bed    Modified Rankin (Stroke Patients Only)       Balance Overall balance assessment: Needs assistance Sitting-balance support: No upper extremity supported, Feet supported Sitting balance-Leahy Scale: Good     Standing balance support: No upper extremity supported, Bilateral upper extremity supported, During functional activity Standing balance-Leahy Scale: Fair Standing balance comment: Able to stand without UE support but reliant on RW to ambulate                            Cognition Arousal: Alert Behavior During Therapy: [facility] for tasks assessed/performed Overall Cognitive Status: Within Functional Limits for tasks assessed                                          Exercises      General Comments General comments (skin integrity, edema, etc.): encouraged more frequent mobility with staff while here      Pertinent Vitals/Pain Pain Assessment Pain Assessment: Faces Faces Pain Scale: No hurt Pain Intervention(s): Monitored during session    Home Living  Prior Function            PT Goals (current goals can now be found in the care plan section) Acute Rehab PT Goals Patient Stated Goal: to get stronger PT Goal Formulation: With patient Time For Goal Achievement: 07/24/23 Potential to Achieve Goals: Good Progress towards PT goals: Progressing toward goals    Frequency    Min 1X/week      PT Plan      Co-evaluation              AM-PAC PT "6 Clicks" Mobility   Outcome Measure  Help needed turning from your back to your side while in a flat  bed without using bedrails?: None Help needed moving from lying on your back to sitting on the side of a flat bed without using bedrails?: A Little Help needed moving to and from a bed to a chair (including a wheelchair)?: A Little Help needed standing up from a chair using your arms (e.g., wheelchair or bedside chair)?: A Little Help needed to walk in hospital room?: A Little Help needed climbing 3-5 steps with a railing? : A Lot 6 Click Score: 18    End of Session Equipment Utilized During Treatment: Gait belt Activity Tolerance: Patient tolerated treatment well Patient left: with call bell/phone within reach;in bed;with bed alarm set   PT Visit Diagnosis: Unsteadiness on feet (R26.81);Muscle weakness (generalized) (M62.81);Other abnormalities of gait and mobility (R26.89)     Time: 2956-2130 PT Time Calculation (min) (ACUTE ONLY): 19 min  Charges:    $Gait Training: 8-22 mins PT General Charges $$ ACUTE PT VISIT: 1 Visit                     Virgil Benedict, PT, DPT Acute Rehabilitation Services  Office: [phone]    Bettina Gavia 07/14/2023, 4:40 PM

## 2023-07-14 NOTE — Progress Notes (Signed)
     This pt has been referred to our Care Connection program. This is a home-based Palliative care program that is provided by [facility]. We will follow the pt at home after discharge to assist with any chronic/acute symptom management needs related to their chronic illness. Our provider will see all pt's for Palliative care consults with ongoing nursing case management and SW visit/telehealth support. The pt will also have after-hours nursing support as well. With this program the pt is eligible for other [facility] services at home with our program in place.  Norm Parcel [phone]

## 2023-07-14 NOTE — Progress Notes (Signed)
Occupational Therapy Treatment Patient Details Name: Tracy Nelson MRN: [id number] DOB: June 26, 1946 Today's Date: 07/14/2023   History of present illness Pt is a 77 y.o. female who presented to [facility] with c/o SOB and BLE edema. Admitted w/ acute HFrEF. PMH includes chronic systolic CHF, mitral regurgitation, tricuspid regurgitation, chronic anemia, PAF on Eliquis, nonischemic cardiomyopathy, RA, CKD IV, recent IM nail hip 07/10/22, osteoporosis, HTN.   OT comments  OT session focused on training techniques for increased safety and independence with functional tasks and improved management of CHF, including training in energy conservation strategies, possible signs of CHF exacerbation, and strategies for decreasing the risk of a CHF exacerbation. Pt verbalized understanding of all training and will benefit from reinforcement. Pt currently demonstrating ability to complete UB ADLs sitting EOB Independent to Set up. Pt declined further ADLs this session. OT also educated pt in B UE therapeutic exercises (see details below) while sitting EOB to increase strength, activity tolerance, and dynamic balance for carryover to functional tasks. Pt with soft but stable BPs with RN aware and all other VSS on RA throughout session. Pt will benefit from continued acute skilled OT services to address deficits outlined below, improve pt management of chronic conditions, and increase safety and independence with functional tasks. Post acute discharge, pt will benefit from continued skilled OT services in the home to maximize rehab potential.       If plan is discharge home, recommend the following:  A little help with walking and/or transfers;A little help with bathing/dressing/bathroom;Assistance with cooking/housework;Help with stairs or ramp for entrance;Assist for transportation   Equipment Recommendations  None recommended by OT (Pt already has needed DME.)    Recommendations for Other Services      Precautions  / Restrictions Precautions Precautions: Fall Restrictions Weight Bearing Restrictions: No       Mobility Bed Mobility Overal bed mobility: Needs Assistance Bed Mobility: Supine to Sit, Sit to Supine, Rolling Rolling: Modified independent (Device/Increase time)   Supine to sit: Contact guard, Used rails, HOB elevated Sit to supine: Supervision   General bed mobility comments: Supervision for safety    Transfers Overall transfer level: Needs assistance Equipment used: Rolling walker (2 wheels) Transfers: Sit to/from Stand Sit to Stand: Supervision           General transfer comment: supervision for safety; pt declined further functional mobility this session     Balance Overall balance assessment: Needs assistance Sitting-balance support: No upper extremity supported, Feet supported Sitting balance-Leahy Scale: Good Sitting balance - Comments: sitting EOB Postural control: Right lateral lean (Pt with mild lateral lean toward end of session as she fatigued. Pt able to self correct with verbal cue.) Standing balance support: Single extremity supported, Bilateral upper extremity supported, No upper extremity supported, During functional activity Standing balance-Leahy Scale: Fair Standing balance comment: able to hold static stand without UE support                           ADL either performed or assessed with clinical judgement   ADL Overall ADL's : Needs assistance/impaired Eating/Feeding: Independent;Sitting   Grooming: Set up;Sitting           Upper Body Dressing : Set up;Sitting                     General ADL Comments: Pt declined ADLs at sink. Pt with decreased activity tolerance, fatiguing quickly during functional tasks. OT educated pt in energy  conservation strategies to increase safety and independence with functional tasks in the home with pt verbalizing understanding of training.    Extremity/Trunk Assessment Upper Extremity  Assessment Upper Extremity Assessment: Generalized weakness   Lower Extremity Assessment Lower Extremity Assessment: Defer to PT evaluation        Vision       Perception     Praxis      Cognition Arousal: Alert Behavior During Therapy: [facility] for tasks assessed/performed Overall Cognitive Status: Within Functional Limits for tasks assessed                                 General Comments: AAOx4 and pleasant throughout. Pt initially resistant to participation in session but agreeable with encouragement.        Exercises Exercises: General Upper Extremity General Exercises - Upper Extremity Shoulder Flexion: AROM, Strengthening, Both, Other reps (comment), Seated (sitting EOB; 2 sets of 10 reps; increased activity tolerance) Shoulder Extension: AROM, Strengthening, Both, Other reps (comment), Seated (sitting EOB; 2 sets of 10 reps; increased activity tolerance) Shoulder ABduction: AROM, Strengthening, Both, Other reps (comment), Seated (sitting EOB; 2 sets of 10 reps; increased activity tolerance) Shoulder ADduction: AROM, Strengthening, Both, Other reps (comment), Seated (sitting EOB; 2 sets of 10 reps; increased activity tolerance) Elbow Flexion: AROM, Strengthening, Both, Other reps (comment), Seated (sitting EOB; 2 sets of 10 reps; increased activity tolerance) Elbow Extension: AROM, Strengthening, Both, Other reps (comment), Seated (sitting EOB; 2 sets of 10 reps; increased activity tolerance)    Shoulder Instructions       General Comments OT educated pt in possible symptoms of CHF exacerbation and strategies for decreasing risk with pt verbalizing understanding of training. Pt able to independently state three possible signs of CHF exacerbation (SOB, fatigue, and edema) and two strategies for decreasing risks of CHF exacerbation (staying active, eating a low-salt diet). Pt will benefit from reinforcement of education. Pt reports it is challenging for her to  stay active when she is fatigued and due to symptoms of RA. Pt may benefit from further education in joint protection strategies. Soft but stable BP with RN aware. All other VSS on RA throughout session.    Pertinent Vitals/ Pain       Pain Assessment Pain Assessment: Faces Faces Pain Scale: Hurts a little bit Pain Location: Left shoulder with movement Pain Descriptors / Indicators: Discomfort, Sore Pain Intervention(s): Limited activity within patient's tolerance, Monitored during session, Repositioned  Home Living                                          Prior Functioning/Environment              Frequency  Min 1X/week        Progress Toward Goals  OT Goals(current goals can now be found in the care plan section)  Progress towards OT goals: Progressing toward goals  Acute Rehab OT Goals Patient Stated Goal: to continue to have less swelling; to return home; to see her grandchildren  Plan      Co-evaluation                 AM-PAC OT "6 Clicks" Daily Activity     Outcome Measure   Help from another person eating meals?: None Help from another person taking care of personal grooming?:  A Little Help from another person toileting, which includes using toliet, bedpan, or urinal?: A Little Help from another person bathing (including washing, rinsing, drying)?: A Little Help from another person to put on and taking off regular upper body clothing?: A Little Help from another person to put on and taking off regular lower body clothing?: A Little 6 Click Score: 19    End of Session Equipment Utilized During Treatment: Rolling walker (2 wheels)  OT Visit Diagnosis: Unsteadiness on feet (R26.81);Muscle weakness (generalized) (M62.81);Other (comment) (decreased activity tolerance)   Activity Tolerance Patient tolerated treatment well;Other (comment);Patient limited by fatigue (Pt initially with self-limiting behaviors, but participating well with  encouragement.)   Patient Left in bed;with call bell/phone within reach;with bed alarm set   Nurse Communication Mobility status        Time: 4098-1191 OT Time Calculation (min): 45 min  Charges: OT General Charges $OT Visit: 1 Visit OT Treatments $Self Care/Home Management : 23-37 mins $Therapeutic Exercise: 8-22 mins  Latrel Szymczak "Orson Eva., OTR/L, MA Acute Rehab [phone]   Lendon Colonel 07/14/2023, 5:27 PM

## 2023-07-14 NOTE — TOC Transition Note (Addendum)
Transition of Care [facility]) - CM/SW Discharge Note   Patient Details  Name: Tracy Nelson MRN: [id number] Date of Birth: 26-Jul-1946  Transition of Care [facility]) CM/SW Contact:  Leone Haven, RN Phone Number: 07/14/2023, 11:32 AM   Clinical Narrative:    Patient is for a possible dc tomorrow.  NCM notified Kelly with [facility], patient  has BSC at the bedside. Her ride will be here later to take her home.  CSW is checking on transportation for MD apts.  Patient wants to switch from [facility] Palliative services to [facility] Palliative services.  This NCM informed Glenna Fellows with [facility].   Final next level of care: Home w Home Health Services Barriers to Discharge: Continued Medical Work up   Patient Goals and CMS Choice CMS Medicare.gov Compare Post Acute Care list provided to:: Patient Choice offered to / list presented to : Patient  Discharge Placement                         Discharge Plan and Services Additional resources added to the After Visit Summary for   In-house Referral: NA Discharge Planning Services: CM Consult Post Acute Care Choice: Durable Medical Equipment, Home Health          DME Arranged: Bedside commode DME Agency: NA       HH Arranged: PT, OT, RN HH Agency: [facility] Date [facility] Agency Contacted: 07/12/23 Time HH Agency Contacted: [phone] Representative spoke with at [facility] Agency: Tresa Endo  Social Determinants of Health (SDOH) Interventions SDOH Screenings   Food Insecurity: No Food Insecurity (07/09/2023)  Housing: Low Risk  (07/09/2023)  Transportation Needs: Unmet Transportation Needs (07/09/2023)  Utilities: Not At Risk (07/09/2023)  Alcohol Screen: Low Risk  (06/03/2023)  Depression (PHQ2-9): Low Risk  (06/03/2023)  Financial Resource Strain: Low Risk  (06/03/2023)  Physical Activity: Inactive (06/03/2023)  Social Connections: Moderately Isolated (06/03/2023)  Stress: No Stress Concern Present  (06/03/2023)  Tobacco Use: Low Risk  (07/09/2023)  Health Literacy: Adequate Health Literacy (06/03/2023)     Readmission Risk Interventions    08/31/2022   12:35 PM  Readmission Risk Prevention Plan  Transportation Screening Complete  HRI or Home Care Consult Complete  Palliative Care Screening Not Applicable  Medication Review (RN Care Manager) Complete

## 2023-07-14 NOTE — Plan of Care (Signed)
  Problem: Health Behavior/Discharge Planning: Goal: Ability to manage health-related needs will improve Outcome: Progressing   Problem: Clinical Measurements: Goal: Cardiovascular complication will be avoided Outcome: Progressing   Problem: Activity: Goal: Risk for activity intolerance will decrease Outcome: Progressing   Problem: Safety: Goal: Ability to remain free from injury will improve Outcome: Progressing   Problem: Activity: Goal: Capacity to carry out activities will improve Outcome: Progressing   Problem: Cardiac: Goal: Ability to achieve and maintain adequate cardiopulmonary perfusion will improve Outcome: Progressing

## 2023-07-14 NOTE — Progress Notes (Addendum)
Advanced Heart Failure Rounding Note  PCP-Cardiologist: None   Subjective:   Admitted 11/30 with A/C systolic HF. Diuresed with IV lasix and switched to torsemide. Overall weight down 10 pounds.   Creatinine trending up -->3.9   Complaining of fatigue.    Objective:   Weight Range: 46 kg Body mass index is 17.96 kg/m.   Vital Signs:   Temp:  [97.6 F (36.4 C)-98.2 F (36.8 C)] 98 F (36.7 C) (12/04 0431) Pulse Rate:  [81-89] 81 (12/04 0431) Resp:  [16-20] 17 (12/04 0431) BP: (89-114)/(62-86) 95/74 (12/04 0445) SpO2:  [92 %-99 %] 97 % (12/04 0431) Weight:  [46 kg] 46 kg (12/04 0458) Last BM Date : 07/11/23  Weight change: Filed Weights   07/12/23 0334 07/13/23 0523 07/14/23 0458  Weight: 48.1 kg 47.5 kg 46 kg   Intake/Output:   Intake/Output Summary (Last 24 hours) at 07/14/2023 0712 Last data filed at 07/14/2023 0005 Gross per 24 hour  Intake 660 ml  Output 1400 ml  Net -740 ml    Physical Exam  General:  Thin. Frail. No resp difficulty HEENT: normal Neck: supple. JVP 6-7 . Carotids 2+ bilat; no bruits. No lymphadenopathy or thryomegaly appreciated. Cor: PMI nondisplaced. Regular rate & rhythm. No rubs, gallops or murmurs. Lungs: clear Abdomen: soft, nontender, nondistended. No hepatosplenomegaly. No bruits or masses. Good bowel sounds. Extremities: no cyanosis, clubbing, rash, edema Neuro: alert & orientedx3, cranial nerves grossly intact. moves all 4 extremities w/o difficulty. Affect flat   Telemetry   SR  EKG    No new EKG to review  Labs  CBC No results for input(s): "WBC", "NEUTROABS", "HGB", "HCT", "MCV", "PLT" in the last 72 hours.  Basic Metabolic Panel Recent Labs    69/62/95 0323 07/13/23 0258 07/14/23 0254  NA 138 138 137  K 4.1 4.1 3.8  CL 106 104 101  CO2 23 24 26   GLUCOSE 115* 113* 120*  BUN 69* 68* 73*  CREATININE 3.68* 3.65* 3.91*  CALCIUM 9.7 10.1 9.7  MG 2.3 2.2  --    Liver Function Tests No results for  input(s): "AST", "ALT", "ALKPHOS", "BILITOT", "PROT", "ALBUMIN" in the last 72 hours. No results for input(s): "LIPASE", "AMYLASE" in the last 72 hours. Cardiac Enzymes No results for input(s): "CKTOTAL", "CKMB", "CKMBINDEX", "TROPONINI" in the last 72 hours.  BNP: BNP (last 3 results) Recent Labs    07/02/23 1415 07/07/23 1142 07/09/23 1045  BNP >4,500.0* >4,500.0* >4,500.0*   ProBNP (last 3 results) No results for input(s): "PROBNP" in the last 8760 hours.  D-Dimer No results for input(s): "DDIMER" in the last 72 hours. Hemoglobin A1C No results for input(s): "HGBA1C" in the last 72 hours. Fasting Lipid Panel No results for input(s): "CHOL", "HDL", "LDLCALC", "TRIG", "CHOLHDL", "LDLDIRECT" in the last 72 hours. Thyroid Function Tests No results for input(s): "TSH", "T4TOTAL", "T3FREE", "THYROIDAB" in the last 72 hours.  Invalid input(s): "FREET3"  Other results:  Imaging   No results found.  Medications:   Scheduled Medications:  amiodarone  200 mg Oral Daily   apixaban  2.5 mg Oral BID   calcitRIOL  0.5 mcg Oral Daily   folic acid  1 mg Oral Daily   pantoprazole  40 mg Oral Daily   predniSONE  5 mg Oral Q breakfast   sodium chloride flush  3 mL Intravenous Q12H   torsemide  40 mg Oral QPM   torsemide  60 mg Oral Q breakfast   Infusions:   PRN Medications:  acetaminophen, ondansetron (ZOFRAN) IV, mouth rinse, sodium chloride flush  Patient Profile   Tracy Nelson is a 77 y.o. with rheumatoid arthritis, CKD stage 3, and nonischemic cardiomyopathy. AHF to see for A/C systolic heart failure  Assessment/Plan   1. Acute on Chronic Systolic CHF: Nonischemic cardiomyopathy.  This has been known since 2015, cath in 2015 showed no coronary disease and cardiac MRI in 2015 showed no LGE.  Cause is uncertain, familial cardiomyopathy is a concern given nonischemic cardiomyopathy in her mother.  Cannot rule out remote viral myocarditis.Poor appetite and severe HF  limitation on CPX in 3/22 is concerning, as is elevated creatinine (suspect cardiorenal syndrome).  Low BP and elevated creatinine have limited her cardiac med regimen. RHC (1/22) showed low filling pressures and actually a relatively preserved cardiac index of 2.22.  PYP scan in 4/22 was equivocal, PYP scan repeated in 3/23 and likely negative.  Cardiac MRI in 10/22 showed mild LV dilation with EF 23%, normal RV size with RVEF 37%, ECV 34%, small area of subendocardial LGE in the mid inferolateral wall and small area of full thickness LGE in the basal inferior wall.   This was not suggestive of cardiac amyloidosis but possibly could be consistent with coronary embolism (though no LV thrombus visualized) versus prior myocarditis versus sarcoidosis. With renal failure, CHF, and atrial arrhythmias, we have assessed for cardiac amyloidosis.  PYP scan was equivocal in 4/22 for transthyretin cardiac amyloidosis.  It was repeated in 3/23 and likely negative.  [employer] gene testing for TTR mutations was negative. The cardiac MRI was not consistent with amyloidosis.  She has MGUS with monoclonal IgG paraprotein, but based on cMRI and slow progression, think AL amyloidosis is unlikely.  Most recent echo 3/24 with EF 20-25%, mild LV dilation, severely decreased RV systolic function, mild RV enlargement, severe biatrial enlargement, moderate-severe TR, moderate MR, IVC normal. She was admitted in 1/24 and required milrinone.  - Chronically NYHA class IIIb symptoms and CKD stage IV.  She is end-stage.  Not a candidate for advanced therapies.  - Volume  status appears stable. Worsening renal function. Hold torsemide today.  - Off Verquvo with hypotension.  - Off dapagliflozin with rise in SCr, with low BP would not restart.   - Off spironolactone with elevated SCr and soft BP.  - Does not have an ICD, would not place with end stage HF.   2. CKD: Stage 4.  She is followed by [name].  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 3. Atrial fibrillation/flutter: S/p TEE-DCCV in 4/22. She is now on amiodarone.  She was in atypical atrial flutter 10/24.  She does not tolerate atrial arrhythmias. - Maintaining SR.  - Continue amiodarone 200 mg daily.  Recent LFTs and TSH normal.  - Continue Eliquis 5 mg bid. 4. Anemia: She is receiving iron infusions per nephrology. - Hgb stable 6. GOC: Briefly discussed GOC with her today. We discussed her end stage HF and all she relayed to me was that she wants to keep trying. Would benefit from Hospice but she is not ready.  Palliative Care following.   Length of Stay: 4  Amy Clegg, NP  07/14/2023, 7:12 AM  Advanced Heart Failure Team Pager [phone] (M-F; 7a - 5p)  Please contact [facility] for night-coverage after hours (5p -7a ) and weekends on [email or web address]   Pt seen with NP, agree with the above note.   Creatinine higher today at 3.91.  Feels weak.  General: NAD, frail.  Neck: No JVD, no thyromegaly or thyroid nodule.  Lungs: Clear to auscultation bilaterally with normal respiratory effort. CV: Nondisplaced PMI.  Heart regular S1/S2, no S3/S4, no murmur.  No peripheral edema.   Abdomen: Soft, nontender, no hepatosplenomegaly, no distention.  Skin: Intact without lesions or rashes.  Neurologic: Alert and oriented x 3.  Psych: Normal affect. Extremities: No clubbing or cyanosis.  HEENT: Normal.   Continue amiodarone and apixaban, remains in NSR.    She is very frail with end stage HF.  Has not tolerated GDMT due to low BP and CKD stage IV.  Volume status looks as good as we are going to get it. Creatinine is higher today, 3.65 => 3.91.  - Hold torsemide today, would try to restart lower dose 40 mg bid tomorrow if creatinine trending back down.    Not candidate for advanced therapies, end stage CHF.  I told her that we will do the best we can to keep her out of the hospital but that we cannot make her heart better.  She is not ready for  hospice yet.  Hopefully home tomorrow if creatinine stabilizes.  Needs to get out of bed.   Marca Ancona 07/14/2023 10:01 AM

## 2023-07-14 NOTE — TOC Progression Note (Signed)
Transition of Care [facility]) - Progression Note    Patient Details  Name: Tracy Nelson MRN: [id number] Date of Birth: 02-08-1946  [facility] CM/SW Contact  Michaela Corner, [place] Phone Number: 07/14/2023, 2:55 PM  Clinical Narrative:   CSW met pt about transportation needs to medical appointments. CSW contacted [facility] and [employer] to retrieve an application for the pt. CSW sat with pt and assisted her in filling out application. CSW faxed application to [employer] and provided [name] with the original application for personal records.   [facility] stated they will follow up with pt once application is reviewed.     Expected Discharge Plan: Home w Home Health Services Barriers to Discharge: Continued Medical Work up  Expected Discharge Plan and Services In-house Referral: NA Discharge Planning Services: CM Consult Post Acute Care Choice: Durable Medical Equipment, Home Health Living arrangements for the past 2 months: Single Family Home                 DME Arranged: Bedside commode DME Agency: NA       HH Arranged: PT, OT, RN HH Agency: [facility] Date HH Agency Contacted: 07/12/23 Time HH Agency Contacted: 1509 Representative spoke with at [facility] Agency: Tresa Endo   Social Determinants of Health (SDOH) Interventions SDOH Screenings   Food Insecurity: No Food Insecurity (07/09/2023)  Housing: Low Risk  (07/09/2023)  Transportation Needs: Unmet Transportation Needs (07/09/2023)  Utilities: Not At Risk (07/09/2023)  Alcohol Screen: Low Risk  (06/03/2023)  Depression (PHQ2-9): Low Risk  (06/03/2023)  Financial Resource Strain: Low Risk  (06/03/2023)  Physical Activity: Inactive (06/03/2023)  Social Connections: Moderately Isolated (06/03/2023)  Stress: No Stress Concern Present (06/03/2023)  Tobacco Use: Low Risk  (07/09/2023)  Health Literacy: Adequate Health Literacy (06/03/2023)     Readmission Risk Interventions    08/31/2022   12:35 PM  Readmission Risk Prevention Plan  Transportation Screening Complete  HRI or Home Care Consult Complete  Palliative Care Screening Not Applicable  Medication Review (RN Care Manager) Complete

## 2023-07-14 NOTE — Plan of Care (Signed)
  Problem: Education: Goal: Knowledge of General Education information will improve Description: Including pain rating scale, medication(s)/side effects and non-pharmacologic comfort measures Outcome: Progressing   Problem: Nutrition: Goal: Adequate nutrition will be maintained Outcome: Progressing   Problem: Coping: Goal: Level of anxiety will decrease Outcome: Progressing   

## 2023-07-14 NOTE — Progress Notes (Signed)
Palliative Medicine Inpatient Follow Up Note HPI:  [name] is a 77 yo female with the past medical history of heart failure, CKD, rheumatoid arthritis, and atrial fibrillation who presented with dyspnea. Being treated for a heart failure exacerbation. Tracy Nelson has been seen by Palliative Care on multiple occasions in the past year and is followed by [facility] OP Palliative services though she has not been seen since June.   Today's Discussion 07/14/2023  *Please note that this is a verbal dictation therefore any spelling or grammatical errors are due to the "Dragon Medical One" system interpretation.  Chart reviewed inclusive of vital signs, progress notes, laboratory results, and diagnostic images.   I met with Tracy Nelson at bedside this afternoon. She is sitting up in her recliner chair. She shares frustration that she has worsened kidney values today. She expresses that she would like to ideally transition to home sooner than later.   Created space and opportunity for patient to explore thoughts feelings and fears regarding current medical situation. She is aware she significant heart disease and does acknowledge that her time is likely limited. She continues to emphasize her own denial about her situation.   We reviewed what's important to Tracy Nelson inclusive of her three grandchildren. She tells me about each of them and the importance they hold in her life. We reviewed the joy she gets out of seeing each of them grow. Allowed Tracy Nelson time to share with me her values in the setting of helping to raise her grandchildren.   Patient asks if her OP Palliative service has been changed and I assured her that [facility] would be supporting her palliative needs moving forward.   Questions and concerns addressed/Palliative Support Provided.   Objective Assessment: Vital Signs Vitals:   07/14/23 0821 07/14/23 1041  BP: 94/69 103/73  Pulse: 86 60  Resp: 20 20  Temp: 98.3 F (36.8  C) 98.2 F (36.8 C)  SpO2: 100% 96%    Intake/Output Summary (Last 24 hours) at 07/14/2023 1254 Last data filed at 07/14/2023 1610 Gross per 24 hour  Intake 460 ml  Output 1400 ml  Net -940 ml   Last Weight  Most recent update: 07/14/2023  4:58 AM    Weight  46 kg (101 lb 6.6 oz)            Gen: Elderly AA F in NAD HEENT: moist mucous membranes CV: Regular rate and irregular rhythm  PULM:  On RA, breathing is even and nonlabored ABD: soft/nontender  EXT: No edema  Neuro: Alert and oriented x3   SUMMARY OF RECOMMENDATIONS   Full Code  Patients goals are ideally to see her grandchildren graduate from highschool though she realizes this is not likely   Appreciate TOC helping patient to enroll in Palliative services through [facility]   Appreciate Cardiology team making a referral to the Paramedicine program --> Patient shares a strong rapport with [name] and willingness to hear and possibly accept his thoughts on her prognosis   Ongoing PMT support  Billing based on MDM: Moderate ______________________________________________________________________________________ Tracy Nelson Palliative Medicine Team Team Cell Phone: [phone] Please utilize secure chat with additional questions, if there is no response within 30 minutes please call the above phone number  Palliative Medicine Team providers are available by phone from 7am to 7pm daily and can be reached through the team cell phone.  Should this patient require assistance outside of these hours, please call the patient's attending  physician.

## 2023-07-15 ENCOUNTER — Telehealth (HOSPITAL_COMMUNITY): Payer: Self-pay | Admitting: Licensed Clinical Social Worker

## 2023-07-15 DIAGNOSIS — I5023 Acute on chronic systolic (congestive) heart failure: Secondary | ICD-10-CM | POA: Diagnosis not present

## 2023-07-15 LAB — BASIC METABOLIC PANEL
Anion gap: 13 (ref 5–15)
BUN: 90 mg/dL — ABNORMAL HIGH (ref 8–23)
CO2: 23 mmol/L (ref 22–32)
Calcium: 9.6 mg/dL (ref 8.9–10.3)
Chloride: 99 mmol/L (ref 98–111)
Creatinine, Ser: 4.23 mg/dL — ABNORMAL HIGH (ref 0.44–1.00)
GFR, Estimated: 10 mL/min — ABNORMAL LOW (ref >60)
Glucose, Bld: 200 mg/dL — ABNORMAL HIGH (ref 70–99)
Potassium: 3.6 mmol/L (ref 3.5–5.1)
Sodium: 135 mmol/L (ref 135–145)

## 2023-07-15 MED ORDER — TRAZODONE HCL 50 MG PO TABS
50.0000 mg | ORAL_TABLET | Freq: Every day | ORAL | Status: DC
  Filled 2023-07-15: qty 1

## 2023-07-15 NOTE — Progress Notes (Addendum)
Advanced Heart Failure Rounding Note  PCP-Cardiologist: None   Subjective:   Admitted 11/30 with A/C systolic HF. Diuresed with IV lasix and switched to torsemide. Overall weight down 10 pounds.   Creatinine trending up: 3.9>4.23. Diuretics held yesterday.   Feels ok, concerned about her kidneys. Has been ambulating in the room.   Objective:   Weight Range: 46.5 kg Body mass index is 18.16 kg/m.   Vital Signs:   Temp:  [98 F (36.7 C)-98.5 F (36.9 C)] 98.3 F (36.8 C) (12/05 0526) Pulse Rate:  [60-97] 97 (12/04 2349) Resp:  [18-20] 18 (12/05 0526) BP: (94-110)/(69-87) 110/87 (12/04 2349) SpO2:  [96 %-100 %] 98 % (12/05 0526) Weight:  [46.5 kg] 46.5 kg (12/05 0526) Last BM Date : 07/14/23  Weight change: Filed Weights   07/13/23 0523 07/14/23 0458 07/15/23 0526  Weight: 47.5 kg 46 kg 46.5 kg   Intake/Output:   Intake/Output Summary (Last 24 hours) at 07/15/2023 0732 Last data filed at 07/15/2023 0528 Gross per 24 hour  Intake 220 ml  Output 200 ml  Net 20 ml    Physical Exam  General:  frail appearing.  No respiratory difficulty HEENT: normal Neck: supple. JVD ~5 cm. Carotids 2+ bilat; no bruits. No lymphadenopathy or thyromegaly appreciated. Cor: PMI nondisplaced. Regular rate & rhythm. No rubs, gallops or murmurs. Lungs: clear Abdomen: soft, nontender, nondistended. No hepatosplenomegaly. No bruits or masses. Good bowel sounds. Extremities: no cyanosis, clubbing, rash, edema  Neuro: alert & oriented x 3, cranial nerves grossly intact. moves all 4 extremities w/o difficulty. Affect pleasant.   Telemetry   NSR 80s (Personally reviewed)    EKG    No new EKG to review  Labs  CBC No results for input(s): "WBC", "NEUTROABS", "HGB", "HCT", "MCV", "PLT" in the last 72 hours.  Basic Metabolic Panel Recent Labs    62/95/28 0258 07/14/23 0254 07/15/23 0223  NA 138 137 135  K 4.1 3.8 3.6  CL 104 101 99  CO2 24 26 23   GLUCOSE 113* 120* 200*  BUN  68* 73* 90*  CREATININE 3.65* 3.91* 4.23*  CALCIUM 10.1 9.7 9.6  MG 2.2  --   --    Liver Function Tests No results for input(s): "AST", "ALT", "ALKPHOS", "BILITOT", "PROT", "ALBUMIN" in the last 72 hours. No results for input(s): "LIPASE", "AMYLASE" in the last 72 hours. Cardiac Enzymes No results for input(s): "CKTOTAL", "CKMB", "CKMBINDEX", "TROPONINI" in the last 72 hours.  BNP: BNP (last 3 results) Recent Labs    07/02/23 1415 07/07/23 1142 07/09/23 1045  BNP >4,500.0* >4,500.0* >4,500.0*   ProBNP (last 3 results) No results for input(s): "PROBNP" in the last 8760 hours.  D-Dimer No results for input(s): "DDIMER" in the last 72 hours. Hemoglobin A1C No results for input(s): "HGBA1C" in the last 72 hours. Fasting Lipid Panel No results for input(s): "CHOL", "HDL", "LDLCALC", "TRIG", "CHOLHDL", "LDLDIRECT" in the last 72 hours. Thyroid Function Tests No results for input(s): "TSH", "T4TOTAL", "T3FREE", "THYROIDAB" in the last 72 hours.  Invalid input(s): "FREET3"  Other results:  Imaging   No results found.  Medications:   Scheduled Medications:  amiodarone  200 mg Oral Daily   apixaban  2.5 mg Oral BID   calcitRIOL  0.5 mcg Oral Daily   folic acid  1 mg Oral Daily   pantoprazole  40 mg Oral Daily   predniSONE  5 mg Oral Q breakfast   sodium chloride flush  3 mL Intravenous Q12H  Infusions:   PRN Medications: acetaminophen, ondansetron (ZOFRAN) IV, mouth rinse, sodium chloride flush  Patient Profile   Tracy Nelson is a 77 y.o. with rheumatoid arthritis, CKD stage 3, and nonischemic cardiomyopathy. AHF to see for A/C systolic heart failure  Assessment/Plan  1. Acute on Chronic Systolic CHF: Nonischemic cardiomyopathy.  This has been known since 2015, cath in 2015 showed no coronary disease and cardiac MRI in 2015 showed no LGE.  Cause is uncertain, familial cardiomyopathy is a concern given nonischemic cardiomyopathy in her mother.  Cannot rule out  remote viral myocarditis.Poor appetite and severe HF limitation on CPX in 3/22 is concerning, as is elevated creatinine (suspect cardiorenal syndrome).  Low BP and elevated creatinine have limited her cardiac med regimen. RHC (1/22) showed low filling pressures and actually a relatively preserved cardiac index of 2.22.  PYP scan in 4/22 was equivocal, PYP scan repeated in 3/23 and likely negative.  Cardiac MRI in 10/22 showed mild LV dilation with EF 23%, normal RV size with RVEF 37%, ECV 34%, small area of subendocardial LGE in the mid inferolateral wall and small area of full thickness LGE in the basal inferior wall.   This was not suggestive of cardiac amyloidosis but possibly could be consistent with coronary embolism (though no LV thrombus visualized) versus prior myocarditis versus sarcoidosis. With renal failure, CHF, and atrial arrhythmias, we have assessed for cardiac amyloidosis.  PYP scan was equivocal in 4/22 for transthyretin cardiac amyloidosis.  It was repeated in 3/23 and likely negative.  [employer] gene testing for TTR mutations was negative. The cardiac MRI was not consistent with amyloidosis.  She has MGUS with monoclonal IgG paraprotein, but based on cMRI and slow progression, think AL amyloidosis is unlikely.  Most recent echo 3/24 with EF 20-25%, mild LV dilation, severely decreased RV systolic function, mild RV enlargement, severe biatrial enlargement, moderate-severe TR, moderate MR, IVC normal. She was admitted in 1/24 and required milrinone.  - Chronically NYHA class IIIb symptoms and CKD stage IV.  She is end-stage.  Not a candidate for advanced therapies.  - Volume status appears stable. Worsening renal function again today. Continue to hold torsemide today. Suspect Torsemide dose was too high at first. Can start 40 mg BID once closer to discharge.  - Off Verquvo with hypotension.  - Off dapagliflozin with rise in SCr, with low BP would not restart.   - Off spironolactone with elevated  SCr and soft BP.  - Does not have an ICD, would not place with end stage HF.   2. CKD: Stage 4.  She is followed by [name].  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 3. Atrial fibrillation/flutter: S/p TEE-DCCV in 4/22. She is now on amiodarone.  She was in atypical atrial flutter 10/24.  She does not tolerate atrial arrhythmias. - Maintaining SR.  - Continue amiodarone 200 mg daily.  Recent LFTs and TSH normal.  - Continue Eliquis 5 mg bid. 4. Anemia: She is receiving iron infusions per nephrology. - Hgb stable 6. GOC: GOC discussions ongoing. Would benefit from Hospice but she is not ready.  - Palliative Care following.   Length of Stay: 5  Alen Bleacher, NP  07/15/2023, 7:32 AM  Advanced Heart Failure Team Pager [phone] (M-F; 7a - 5p)  Please contact [facility] for night-coverage after hours (5p -7a ) and weekends on [email or web address]  Patient seen with NP, agree with the above note.   No diuretic yesterday but creatinine higher today  at 4.23.  Still making urine.  No dyspnea.   General: NAD, frail.  Neck: No JVD, no thyromegaly or thyroid nodule.  Lungs: Clear to auscultation bilaterally with normal respiratory effort. CV: Nondisplaced PMI.  Heart regular S1/S2, no S3/S4, 2/6 HSM apex/LLSB.  No peripheral edema.   Abdomen: Soft, nontender, no hepatosplenomegaly, no distention.  Skin: Intact without lesions or rashes.  Neurologic: Alert and oriented x 3.  Psych: Normal affect. Extremities: No clubbing or cyanosis.  HEENT: Normal.   Continue amiodarone and apixaban, remains in NSR.    She is very frail with end stage HF.  Has not tolerated GDMT due to low BP and CKD stage IV.  Volume status looks as good as we are going to get it. Creatinine is higher today, 3.65 => 3.91 => 4.23. Still making urine.  - Hold torsemide again today, would try to restart lower dose 40 qam/20 qpm when creatinine is trending back down.    Not candidate for advanced  therapies, end stage CHF.  I told her that we will do the best we can to keep her out of the hospital but that we cannot make her heart better.  She is not ready for hospice yet. Home when creatinine stabilizes.  Needs to get out of bed.   Marca Ancona 07/15/2023 11:15 AM

## 2023-07-15 NOTE — Plan of Care (Signed)

## 2023-07-15 NOTE — Telephone Encounter (Signed)
H&V Care Navigation CSW Progress Note  Clinical Social Worker consulted to enroll patient in paramedic to try and avoid readmission.  CSW spoke with patient who is agreeable and confirmed phone number and address.  Referral sent to paramedics for review and follow up.  SDOH Screenings   Food Insecurity: No Food Insecurity (07/09/2023)  Housing: Low Risk  (07/09/2023)  Transportation Needs: Unmet Transportation Needs (07/09/2023)  Utilities: Not At Risk (07/09/2023)  Alcohol Screen: Low Risk  (06/03/2023)  Depression (PHQ2-9): Low Risk  (06/03/2023)  Financial Resource Strain: Low Risk  (06/03/2023)  Physical Activity: Inactive (06/03/2023)  Social Connections: Moderately Isolated (06/03/2023)  Stress: No Stress Concern Present (06/03/2023)  Tobacco Use: Low Risk  (07/09/2023)  Health Literacy: Adequate Health Literacy (06/03/2023)   Burna Sis, LCSW Clinical Social Worker Advanced Heart Failure Clinic Desk#: [phone] Cell#: [phone]

## 2023-07-15 NOTE — Progress Notes (Signed)
PROGRESS NOTE    Tracy Nelson  EXB:[id number] DOB: 05/16/1946 DOA: 07/09/2023 PCP: [name], [name]  77/F w systolic CHF, pulmonary hypertension, CKD4, rheumatoid arthritis, and atrial fibrillation who presented with dyspnea. Reported worsening dyspnea for the last 2 weeks. She was evaluated as outpatient at the heart failure clinic, she received sq furosemide and oral bumetanide then presented to the ED. VSS, + edema CXR w with cardiomegaly, with bilateral hilar vascular congestion with cephalization of the vasculature.   -placed on IV furosemide for diuresis.  12/2  aggressive diuresis, consult palliative care.  12/3 patient with improvement in volume status with diuretic therapy, she has been transitioned to torsemide. Palliative care services consulted. She will benefit from hospice services.  12/4: creat higher, torsemide held   Subjective: Feels okay overall, upset about worsening kidney function  Assessment and Plan:  Acute on chronic systolic CHF, BiV Failure Pulm HTN -Echo with EF 20 to 25 %, global hypokinesis, mild dilatated internal cavity, RV systolic function with severe reduction, moderate mitral valve regurgitation, moderate to severe tricuspid valve regurgitation, mild to moderate aortic valve regurgitation. RVSP 34.8 mmHg  -diuresed w/ IV lasix, changed to torsemide, now held w/ worsening creat -GDMT limited by CKD 4, soft blood pressures Patient with end stage heart failure, palliative consulted, pt is not ready for Hospice yet, high risk of quick re-admission, wishes to remain full code  AKI on CKD 4 -creat higher, torsemide held today Follow up renal function in am.  Anemia of chronic renal disease. -Stable  HTN (hypertension) Soft, stable  Atrial flutter (HCC) Currently rate controlled, sinus rhythm.  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   Continue with home dose of prednisone 5 mg No current indication for stress doses.   GERD (gastroesophageal reflux disease) Continue with pantoprazole.    DVT prophylaxis: apixaban Code Status: Full Family Communication: none present Disposition Plan: ? Home tomorrow  Consultants:    Procedures:   Antimicrobials:    Objective: Vitals:   07/14/23 2349 07/15/23 0526 07/15/23 0735 07/15/23 1155  BP: 110/87  96/73 98/85  Pulse: 97  83 87  Resp: 18 18 16 20   Temp: 98 F (36.7 C) 98.3 F (36.8 C) 98.2 F (36.8 C) 98.1 F (36.7 C)  TempSrc: Oral Oral Oral Oral  SpO2: 99% 98% 96% 100%  Weight:  46.5 kg    Height:        Intake/Output Summary (Last 24 hours) at 07/15/2023 1209 Last data filed at 07/15/2023 1158 Gross per 24 hour  Intake 223 ml  Output 500 ml  Net -277 ml   Filed Weights   07/13/23 0523 07/14/23 0458 07/15/23 0526  Weight: 47.5 kg 46 kg 46.5 kg    Examination:   Gen: Awake, Alert, Oriented X 3, chronically ill, Cushingoid facies HEENT: no JVD Lungs: Good air movement bilaterally, CTAB CVS: S1S2/RRR Abd: soft, Non tender, non distended, BS present Extremities: No edema Skin: no new rashes on exposed skin    Data Reviewed:   CBC: Recent Labs  Lab 07/09/23 1045 07/10/23 0225  WBC 9.1 8.6  NEUTROABS 7.8* 6.5  HGB 10.1* 10.1*  HCT 33.8* 32.6*  MCV 96.3 94.8  PLT 161 163   Basic Metabolic Panel: Recent Labs  Lab 07/11/23 0320 07/12/23 0323 07/13/23 0258 07/14/23 0254 07/15/23 0223  NA 137 138 138 137 135  K 4.4 4.1 4.1 3.8 3.6  CL 105 106 104 101 99  CO2 23 23 24 26 23   GLUCOSE 127* 115* 113* 120* 200*  BUN 65* 69* 68* 73* 90*  CREATININE 3.64* 3.68* 3.65* 3.91* 4.23*  CALCIUM 10.0 9.7 10.1 9.7 9.6  MG 2.3 2.3 2.2  --   --    GFR: Estimated Creatinine Clearance: 8.2 mL/min (A) (by C-G formula based on SCr of 4.23 mg/dL (H)). Liver Function Tests: Recent Labs  Lab 07/09/23 1045  AST 20  ALT 24  ALKPHOS 77  BILITOT  0.7  PROT 5.8*  ALBUMIN 2.9*   No results for input(s): "LIPASE", "AMYLASE" in the last 168 hours. No results for input(s): "AMMONIA" in the last 168 hours. Coagulation Profile: No results for input(s): "INR", "PROTIME" in the last 168 hours. Cardiac Enzymes: No results for input(s): "CKTOTAL", "CKMB", "CKMBINDEX", "TROPONINI" in the last 168 hours. BNP (last 3 results) No results for input(s): "PROBNP" in the last 8760 hours. HbA1C: No results for input(s): "HGBA1C" in the last 72 hours. CBG: No results for input(s): "GLUCAP" in the last 168 hours. Lipid Profile: No results for input(s): "CHOL", "HDL", "LDLCALC", "TRIG", "CHOLHDL", "LDLDIRECT" in the last 72 hours. Thyroid Function Tests: No results for input(s): "TSH", "T4TOTAL", "FREET4", "T3FREE", "THYROIDAB" in the last 72 hours. Anemia Panel: No results for input(s): "VITAMINB12", "FOLATE", "FERRITIN", "TIBC", "IRON", "RETICCTPCT" in the last 72 hours. Urine analysis:    Component Value Date/Time   COLORURINE YELLOW 11/25/2022 1730   APPEARANCEUR CLEAR 11/25/2022 1730   LABSPEC 1.016 11/25/2022 1730   PHURINE 5.0 11/25/2022 1730   GLUCOSEU NEGATIVE 11/25/2022 1730   HGBUR NEGATIVE 11/25/2022 1730   BILIRUBINUR NEGATIVE 11/25/2022 1730   BILIRUBINUR negative 06/04/2021 1151   KETONESUR NEGATIVE 11/25/2022 1730   PROTEINUR TRACE (A) 11/25/2022 1730   UROBILINOGEN 0.2 06/04/2021 1151   NITRITE NEGATIVE 11/25/2022 1730   LEUKOCYTESUR MODERATE (A) 11/25/2022 1730   Sepsis Labs: @LABRCNTIP (procalcitonin:4,lacticidven:4)  )No results found for this or any previous visit (from the past 240 hour(s)).   Radiology Studies: No results found.   Scheduled Meds:  amiodarone  200 mg Oral Daily   apixaban  2.5 mg Oral BID   calcitRIOL  0.5 mcg Oral Daily   folic acid  1 mg Oral Daily   pantoprazole  40 mg Oral Daily   predniSONE  5 mg Oral Q breakfast   sodium chloride flush  3 mL Intravenous Q12H   traZODone  50 mg Oral  QHS   Continuous Infusions:   LOS: 5 days    Time spent:    Zannie Cove, MD [facility]   07/15/2023, 12:09 PM

## 2023-07-15 NOTE — Progress Notes (Signed)
Mobility Specialist Progress Note:    07/15/23 1150  Mobility  Activity Ambulated with assistance in room  Level of Assistance Contact guard assist, steadying assist  Assistive Device Four wheel walker  Distance Ambulated (ft) 60 ft  Activity Response Tolerated well  Mobility Referral Yes  Mobility visit 1 Mobility  Mobility Specialist Start Time (ACUTE ONLY) 1050  Mobility Specialist Stop Time (ACUTE ONLY) 1103  Mobility Specialist Time Calculation (min) (ACUTE ONLY) 13 min   Pt received in bed, hesitant but agreeable to mobility w/ mod encouragement. Was able to ambulate to and from the door 3 times. No c/o throughout. Returned to bed w/o fault. All needs met w/ call bell and personal belongings in reach.    Thompson Grayer Mobility Specialist  Please contact vis Secure Chat or  Rehab Office [phone]

## 2023-07-15 NOTE — Progress Notes (Signed)
Physical Therapy Treatment Patient Details Name: Tracy Nelson MRN: [id number] DOB: 1945/09/28 Today's Date: 07/15/2023   History of Present Illness Pt is a 77 y.o. female who presented to [facility] with c/o SOB and BLE edema. Admitted w/ acute HFrEF. PMH includes chronic systolic CHF, mitral regurgitation, tricuspid regurgitation, chronic anemia, PAF on Eliquis, nonischemic cardiomyopathy, RA, CKD IV, recent IM nail hip 07/10/22, osteoporosis, HTN.    PT Comments  Pt is mobilizing well at mod I for most functional mobility. Pt requires supervision for sit to stand just for verbal cues to lock rollator for safety. Pt frequency was decreased. Continued need for skilled physical therapy services to work on endurance. Recommending skilled physical therapy services 3x/weekly on discharge from acute care hospital setting in order to address strength, balance and activity tolerance to decrease risk for falls, injury and re-hospitalization.     If plan is discharge home, recommend the following: A little help with walking and/or transfers;Help with stairs or ramp for entrance;A little help with bathing/dressing/bathroom;Assistance with cooking/housework     Equipment Recommendations  None recommended by PT       Precautions / Restrictions Precautions Precautions: Fall Restrictions Weight Bearing Restrictions: No     Mobility  Bed Mobility Overal bed mobility: Modified Independent Bed Mobility: Supine to Sit, Sit to Supine     Supine to sit: Modified independent (Device/Increase time) Sit to supine: Modified independent (Device/Increase time)        Transfers Overall transfer level: Modified independent Equipment used: Rollator (4 wheels) Transfers: Sit to/from Stand Sit to Stand: Supervision           General transfer comment: Verbal cue to lock brakes    Ambulation/Gait Ambulation/Gait assistance: Modified independent (Device/Increase time) Gait Distance (Feet): 120  Feet Assistive device: Rollator (4 wheels) Gait Pattern/deviations: Step-through pattern, Decreased stride length Gait velocity: slightly reduced. Gait velocity interpretation: 1.31 - 2.62 ft/sec, indicative of limited community ambulator   General Gait Details: pt is able to pace activity and understands when she needs to take rest breaks.        Balance Overall balance assessment: Mild deficits observed, not formally tested   Sitting balance-Leahy Scale: Normal     Standing balance support: Bilateral upper extremity supported, No upper extremity supported, During functional activity Standing balance-Leahy Scale: Good Standing balance comment: Good with rollator during functional activity and in static standing without UE support        Cognition Arousal: Alert Behavior During Therapy: WFL for tasks assessed/performed Overall Cognitive Status: Within Functional Limits for tasks assessed     General Comments: AAOx4 and pleasant throughout. Pt initially resistant to participation in session but agreeable with encouragement.           General Comments General comments (skin integrity, edema, etc.): Pt educated on importance of mobility in the health of the cardiopulmonary system especially when she is at home and does not have staff encouraging her to get out of bed. Pt stated understanding.      Pertinent Vitals/Pain Pain Assessment Pain Assessment: No/denies pain     PT Goals (current goals can now be found in the care plan section) Acute Rehab PT Goals Patient Stated Goal: to get stronger PT Goal Formulation: With patient Time For Goal Achievement: 07/24/23 Potential to Achieve Goals: Good Progress towards PT goals: Progressing toward goals    Frequency    Min 1X/week      PT Plan  Continue with current POC  AM-PAC PT "6 Clicks" Mobility   Outcome Measure  Help needed turning from your back to your side while in a flat bed without using  bedrails?: None Help needed moving from lying on your back to sitting on the side of a flat bed without using bedrails?: None Help needed moving to and from a bed to a chair (including a wheelchair)?: A Little Help needed standing up from a chair using your arms (e.g., wheelchair or bedside chair)?: A Little Help needed to walk in hospital room?: None Help needed climbing 3-5 steps with a railing? : A Little 6 Click Score: 21    End of Session Equipment Utilized During Treatment: Gait belt Activity Tolerance: Patient tolerated treatment well Patient left: with call bell/phone within reach;in bed Nurse Communication: Mobility status PT Visit Diagnosis: Unsteadiness on feet (R26.81);Muscle weakness (generalized) (M62.81);Other abnormalities of gait and mobility (R26.89)     Time: 4098-1191 PT Time Calculation (min) (ACUTE ONLY): 16 min  Charges:    $Therapeutic Activity: 8-22 mins PT General Charges $$ ACUTE PT VISIT: 1 Visit                    Harrel Carina, DPT, CLT  Acute Rehabilitation Services Office: [phone] (Secure chat preferred)    Claudia Desanctis 07/15/2023, 4:25 PM

## 2023-07-16 ENCOUNTER — Other Ambulatory Visit (HOSPITAL_COMMUNITY): Payer: Self-pay | Admitting: Cardiology

## 2023-07-16 DIAGNOSIS — I5023 Acute on chronic systolic (congestive) heart failure: Secondary | ICD-10-CM | POA: Diagnosis not present

## 2023-07-16 LAB — CBC
HCT: 35.9 % — ABNORMAL LOW (ref 36.0–46.0)
Hemoglobin: 11.2 g/dL — ABNORMAL LOW (ref 12.0–15.0)
MCH: 28.9 pg (ref 26.0–34.0)
MCHC: 31.2 g/dL (ref 30.0–36.0)
MCV: 92.8 fL (ref 80.0–100.0)
Platelets: 198 10*3/uL (ref 150–400)
RBC: 3.87 MIL/uL (ref 3.87–5.11)
RDW: 15.9 % — ABNORMAL HIGH (ref 11.5–15.5)
WBC: 7.8 10*3/uL (ref 4.0–10.5)
nRBC: 0 % (ref 0.0–0.2)

## 2023-07-16 LAB — MAGNESIUM: Magnesium: 2.5 mg/dL — ABNORMAL HIGH (ref 1.7–2.4)

## 2023-07-16 LAB — BASIC METABOLIC PANEL
Anion gap: 10 (ref 5–15)
BUN: 87 mg/dL — ABNORMAL HIGH (ref 8–23)
CO2: 23 mmol/L (ref 22–32)
Calcium: 9.7 mg/dL (ref 8.9–10.3)
Chloride: 102 mmol/L (ref 98–111)
Creatinine, Ser: 4.03 mg/dL — ABNORMAL HIGH (ref 0.44–1.00)
GFR, Estimated: 11 mL/min — ABNORMAL LOW (ref >60)
Glucose, Bld: 102 mg/dL — ABNORMAL HIGH (ref 70–99)
Potassium: 3.8 mmol/L (ref 3.5–5.1)
Sodium: 135 mmol/L (ref 135–145)

## 2023-07-16 MED ORDER — TORSEMIDE 20 MG PO TABS: 40.0000 mg | ORAL_TABLET | Freq: Every day | ORAL | Status: DC

## 2023-07-16 MED ORDER — TORSEMIDE 20 MG PO TABS: 20.0000 mg | ORAL_TABLET | Freq: Every evening | ORAL | Status: DC

## 2023-07-16 MED ORDER — TORSEMIDE 40 MG PO TABS: 40.0000 mg | ORAL_TABLET | Freq: Every morning | ORAL | 1 refills | Status: DC

## 2023-07-16 MED ORDER — POTASSIUM CHLORIDE CRYS ER 10 MEQ PO TBCR
10.0000 meq | EXTENDED_RELEASE_TABLET | Freq: Every day | ORAL | Status: DC
Start: 2023-07-17 — End: 2023-07-16

## 2023-07-16 MED ORDER — AMIODARONE HCL 200 MG PO TABS: 200.0000 mg | ORAL_TABLET | Freq: Every day | ORAL | Status: DC

## 2023-07-16 MED ORDER — TORSEMIDE 20 MG PO TABS: 20.0000 mg | ORAL_TABLET | Freq: Every evening | ORAL | 0 refills | Status: DC

## 2023-07-16 MED ORDER — TORSEMIDE 20 MG PO TABS: 20.0000 mg | ORAL_TABLET | Freq: Every day | ORAL | Status: DC

## 2023-07-16 NOTE — Progress Notes (Addendum)
Advanced Heart Failure Rounding Note  PCP-Cardiologist: None   Subjective:   Admitted 11/30 with A/C systolic HF. Diuresed with IV lasix and switched to torsemide. Overall weight down 10 pounds.   Creatinine down trending: 3.9>4.23>4.03. Diuretics held last 2 days.   Feels fine. Set up for breakfast.   Objective:   Weight Range: 46.9 kg Body mass index is 18.32 kg/m.   Vital Signs:   Temp:  [98 F (36.7 C)-98.9 F (37.2 C)] 98 F (36.7 C) (12/06 0334) Pulse Rate:  [83-87] 83 (12/06 0334) Resp:  [16-20] 18 (12/06 0334) BP: (96-109)/(71-85) 100/73 (12/06 0334) SpO2:  [96 %-100 %] 100 % (12/06 0334) Weight:  [46.9 kg] 46.9 kg (12/06 0648) Last BM Date : 07/14/23  Weight change: Filed Weights   07/14/23 0458 07/15/23 0526 07/16/23 0648  Weight: 46 kg 46.5 kg 46.9 kg   Intake/Output:   Intake/Output Summary (Last 24 hours) at 07/16/2023 9147 Last data filed at 07/16/2023 0252 Gross per 24 hour  Intake 346 ml  Output 900 ml  Net -554 ml    Physical Exam  General:  frail appearing.  No respiratory difficulty HEENT: normal Neck: supple. JVD ~7 cm. Carotids 2+ bilat; no bruits. No lymphadenopathy or thyromegaly appreciated. Cor: PMI nondisplaced. Regular rate & rhythm. No rubs, gallops or murmurs. Lungs: clear Abdomen: soft, nontender, nondistended. No hepatosplenomegaly. No bruits or masses. Good bowel sounds. Extremities: no cyanosis, clubbing, rash, edema  Neuro: alert & oriented x 3, cranial nerves grossly intact. moves all 4 extremities w/o difficulty. Affect pleasant.   Telemetry   NSR 80s (Personally reviewed)    EKG    No new EKG to review  Labs  CBC Recent Labs    07/16/23 0242  WBC 7.8  HGB 11.2*  HCT 35.9*  MCV 92.8  PLT 198    Basic Metabolic Panel Recent Labs    82/95/62 0223 07/16/23 0242  NA 135 135  K 3.6 3.8  CL 99 102  CO2 23 23  GLUCOSE 200* 102*  BUN 90* 87*  CREATININE 4.23* 4.03*  CALCIUM 9.6 9.7  MG  --  2.5*    Liver Function Tests No results for input(s): "AST", "ALT", "ALKPHOS", "BILITOT", "PROT", "ALBUMIN" in the last 72 hours. No results for input(s): "LIPASE", "AMYLASE" in the last 72 hours. Cardiac Enzymes No results for input(s): "CKTOTAL", "CKMB", "CKMBINDEX", "TROPONINI" in the last 72 hours.  BNP: BNP (last 3 results) Recent Labs    07/02/23 1415 07/07/23 1142 07/09/23 1045  BNP >4,500.0* >4,500.0* >4,500.0*   ProBNP (last 3 results) No results for input(s): "PROBNP" in the last 8760 hours.  D-Dimer No results for input(s): "DDIMER" in the last 72 hours. Hemoglobin A1C No results for input(s): "HGBA1C" in the last 72 hours. Fasting Lipid Panel No results for input(s): "CHOL", "HDL", "LDLCALC", "TRIG", "CHOLHDL", "LDLDIRECT" in the last 72 hours. Thyroid Function Tests No results for input(s): "TSH", "T4TOTAL", "T3FREE", "THYROIDAB" in the last 72 hours.  Invalid input(s): "FREET3"  Other results:  Imaging   No results found.  Medications:   Scheduled Medications:  amiodarone  200 mg Oral Daily   apixaban  2.5 mg Oral BID   calcitRIOL  0.5 mcg Oral Daily   folic acid  1 mg Oral Daily   pantoprazole  40 mg Oral Daily   predniSONE  5 mg Oral Q breakfast   sodium chloride flush  3 mL Intravenous Q12H   traZODone  50 mg Oral QHS  Infusions:   PRN Medications: acetaminophen, ondansetron (ZOFRAN) IV, mouth rinse, sodium chloride flush  Patient Profile   Tracy Nelson is a 77 y.o. with rheumatoid arthritis, CKD stage 3, and nonischemic cardiomyopathy. AHF to see for A/C systolic heart failure  Assessment/Plan  1. Acute on Chronic Systolic CHF: Nonischemic cardiomyopathy.  This has been known since 2015, cath in 2015 showed no coronary disease and cardiac MRI in 2015 showed no LGE.  Cause is uncertain, familial cardiomyopathy is a concern given nonischemic cardiomyopathy in her mother.  Cannot rule out remote viral myocarditis.Poor appetite and severe HF  limitation on CPX in 3/22 is concerning, as is elevated creatinine (suspect cardiorenal syndrome).  Low BP and elevated creatinine have limited her cardiac med regimen. RHC (1/22) showed low filling pressures and actually a relatively preserved cardiac index of 2.22.  PYP scan in 4/22 was equivocal, PYP scan repeated in 3/23 and likely negative.  Cardiac MRI in 10/22 showed mild LV dilation with EF 23%, normal RV size with RVEF 37%, ECV 34%, small area of subendocardial LGE in the mid inferolateral wall and small area of full thickness LGE in the basal inferior wall.   This was not suggestive of cardiac amyloidosis but possibly could be consistent with coronary embolism (though no LV thrombus visualized) versus prior myocarditis versus sarcoidosis. With renal failure, CHF, and atrial arrhythmias, we have assessed for cardiac amyloidosis.  PYP scan was equivocal in 4/22 for transthyretin cardiac amyloidosis.  It was repeated in 3/23 and likely negative.  [employer] gene testing for TTR mutations was negative. The cardiac MRI was not consistent with amyloidosis.  She has MGUS with monoclonal IgG paraprotein, but based on cMRI and slow progression, think AL amyloidosis is unlikely.  Most recent echo 3/24 with EF 20-25%, mild LV dilation, severely decreased RV systolic function, mild RV enlargement, severe biatrial enlargement, moderate-severe TR, moderate MR, IVC normal. She was admitted in 1/24 and required milrinone.  - Chronically NYHA class IIIb symptoms and CKD stage IV.  She is end-stage.  Not a candidate for advanced therapies.  - Volume status appears stable. Renal function down trending. Will restart Torsemide today at 40 am/20 pm. +30mEq daily KDUR - Off Verquvo with hypotension.  - Off dapagliflozin with rise in SCr, with low BP would not restart.   - Off spironolactone with elevated SCr and soft BP.  - Does not have an ICD, would not place with end stage HF.   2. CKD: Stage 4.  She is followed by Dr.  Malen Gauze.  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 3. Atrial fibrillation/flutter: S/p TEE-DCCV in 4/22. She is now on amiodarone.  She was in atypical atrial flutter 10/24.  She does not tolerate atrial arrhythmias. - Maintaining SR.  - Continue amiodarone 200 mg daily.  Recent LFTs and TSH normal.  - Continue Eliquis 2.5 mg bid. 4. Anemia: She is receiving iron infusions per nephrology. - Hgb stable 6. GOC: GOC discussions ongoing. Would benefit from Hospice but she is not ready.  - Palliative Care following.   Has f/u arranged in AHF clinic next week. Suspect stable for discharge today, pending MD eval.   AHF meds at discharge:  Amiodarone 200 mg daily Eliquis 2.5 mg BID Torsemide 40am/20 pm KDUR daily  Length of Stay: 6  Alen Bleacher, NP  07/16/2023, 7:12 AM  Advanced Heart Failure Team Pager [phone] (M-F; 7a - 5p)  Please contact  [facility] for night-coverage after hours (5p -7a ) and weekends on [email or web address]  Patient seen with NP, agree with the above note.   Creatinine lower today, down to 4.03.  She feels the same, no dyspnea at rest. She is in NSR.   General: NAD, frail.  Neck: No JVD, no thyromegaly or thyroid nodule.  Lungs: Clear to auscultation bilaterally with normal respiratory effort. CV: Nondisplaced PMI.  Heart regular S1/S2, no S3/S4, no murmur.  No peripheral edema.  Abdomen: Soft, nontender, no hepatosplenomegaly, no distention.  Skin: Intact without lesions or rashes.  Neurologic: Alert and oriented x 3.  Psych: Normal affect. Extremities: No clubbing or cyanosis.  HEENT: Normal.   She can start on lower dose torsemide, 40 qam/20 qpm, today now that renal function is improving.  I think she can go home today with close followup in CHF clinic next week, will need BMET at that time.   Marca Ancona 07/16/2023 11:03 AM

## 2023-07-16 NOTE — TOC Progression Note (Signed)
Transition of Care [facility]) - Progression Note    Patient Details  Name: Tracy Nelson MRN: [id number] Date of Birth: 15-May-1946  Transition of [facility]) CM/SW Contact  Elliot Cousin, RN Phone Number: [phone] 07/16/2023, 11:41 AM  Clinical Narrative:   CM spoke to pt and she lives with daughter. Daughter will provide transportation.  Pt has bedside commode from [employer].  Contacted [facility] rep, Tresa Endo to make aware of scheduled dc home today.  Contacted [facility] rep, Cheri for outpatient palliative. Patient agreeable to outpatient palliative.     Expected Discharge Plan: Home w Home Health Services Barriers to Discharge: No Barriers Identified  Expected Discharge Plan and Services In-house Referral: NA Discharge Planning Services: CM Consult Post Acute Care Choice: Durable Medical Equipment, Home Health Living arrangements for the past 2 months: Single Family Home Expected Discharge Date: 07/16/23               DME Arranged: Bedside commode DME Agency: NA       HH Arranged: PT, OT, RN HH Agency: [facility] Date HH Agency Contacted: 07/12/23 Time HH Agency Contacted: 1509 Representative spoke with at [facility] Agency: Tresa Endo   Social Determinants of Health (SDOH) Interventions SDOH Screenings   Food Insecurity: No Food Insecurity (07/09/2023)  Housing: Low Risk  (07/09/2023)  Transportation Needs: Unmet Transportation Needs (07/09/2023)  Utilities: Not At Risk (07/09/2023)  Alcohol Screen: Low Risk  (06/03/2023)  Depression (PHQ2-9): Low Risk  (06/03/2023)  Financial Resource Strain: Low Risk  (06/03/2023)  Physical Activity: Inactive (06/03/2023)  Social Connections: Moderately Isolated (06/03/2023)  Stress: No Stress Concern Present (06/03/2023)  Tobacco Use: Low Risk  (07/09/2023)  Health Literacy: Adequate Health Literacy (06/03/2023)    Readmission Risk Interventions    08/31/2022   12:35 PM  Readmission Risk Prevention Plan   Transportation Screening Complete  HRI or Home Care Consult Complete  Palliative Care Screening Not Applicable  Medication Review (RN Care Manager) Complete

## 2023-07-16 NOTE — Care Management Important Message (Signed)
Important Message  Patient Details  Name: Tracy Nelson MRN: [id number] Date of Birth: Mar 12, 1946   Important Message Given:  Yes - Medicare IM     Dorena Bodo 07/16/2023, 3:20 PM

## 2023-07-16 NOTE — Plan of Care (Signed)

## 2023-07-16 NOTE — Discharge Summary (Signed)
Physician Discharge Summary  Tracy Nelson GEX:[id number] DOB: 1945/10/05 DOA: 07/09/2023  PCP: [name], [name]  Admit date: 07/09/2023 Discharge date: 07/16/2023  Time spent: 45 minutes  Recommendations for Outpatient Follow-up:  Advanced heart failure clinic next week Outpatient palliative care Continue goals of care discussions   Discharge Diagnoses:  Principal Problem:   Acute on chronic systolic CHF (congestive heart failure) (HCC) End-stage biventricular failure Acute kidney injury   CKD (chronic kidney disease), stage IV (HCC)   HTN (hypertension)   Atrial flutter (HCC)   Rheumatoid arthritis involving multiple sites with positive rheumatoid factor (HCC)   GERD (gastroesophageal reflux disease)   Discharge Condition: Guarded  Diet recommendation: Low-sodium, heart healthy  Filed Weights   07/14/23 0458 07/15/23 0526 07/16/23 0648  Weight: 46 kg 46.5 kg 46.9 kg    History of present illness:  77/F w systolic CHF, pulmonary hypertension, CKD4, rheumatoid arthritis, and atrial fibrillation who presented with dyspnea. Reported worsening dyspnea for the last 2 weeks. She was evaluated as outpatient at the heart failure clinic, she received sq furosemide and oral bumetanide then presented to the ED. VSS, + edema CXR w with cardiomegaly, with bilateral hilar vascular congestion with cephalization of the vasculature.   Hospital Course:   Acute on chronic systolic CHF,  End-stage biventricular failure Pulm HTN -Echo with EF 20 to 25 %, global hypokinesis, RV systolic function with severe reduction, moderate mitral valve regurgitation, moderate to severe tricuspid valve regurgitation,  -diuresed w/ IV lasix, changed to torsemide, complicated by worsening kidney function -GDMT limited by CKD 4, soft blood pressures -Followed by advanced heart failure team, prognosis is very poor with end-stage BiV failure and progressive CKD, hospice was recommended, seen by palliative  care, patient is not ready for hospice yet, she is at high risk of quick readmission -Transition to torsemide today, She will follow-up with heart failure clinic next week, referral sent to outpatient palliative care as well   AKI on CKD 4 -Poor prognosis, cardiorenal, creatinine plateauing around 4 -See above, torsemide resumed   Anemia of chronic renal disease. -Stable   HTN (hypertension) Soft, stable   Atrial flutter (HCC) Currently rate controlled, sinus rhythm.  Continue apixaban, renal dose to 2.5 mg daily    Rheumatoid arthritis involving multiple sites with positive rheumatoid factor (HCC) Sjogren's syndrome.  Continue with home dose of prednisone 5 mg   GERD (gastroesophageal reflux disease) Continue with pantoprazole.     Consultations: Palliative care care, advanced heart failure team  Discharge Exam: Vitals:   07/16/23 0737 07/16/23 1041  BP: 98/72 91/70  Pulse:  76  Resp: 18 18  Temp: 98.2 F (36.8 C) (!) 97.5 F (36.4 C)  SpO2:  99%   Gen: Frail cachectic chronically ill awake, Alert, Oriented X 3,  HEENT: no JVD Lungs: Good air movement bilaterally, CTAB CVS: S1S2/RRR Abd: soft, Non tender, non distended, BS present Extremities: No edema Skin: no new rashes on exposed skin   Discharge Instructions   Discharge Instructions     Amb Referral to Palliative Care   Complete by: As directed    Poor prognosis, hospice has been recommended but she declines that at this time, continue goals of care discussions   Diet - low sodium heart healthy   Complete by: As directed    Increase activity slowly   Complete by: As directed    No wound care   Complete by: As directed       Allergies as of 07/16/2023  Reactions   Baclofen Nausea And Vomiting   Penicillins Rash        Medication List     STOP taking these medications    bumetanide 1 MG tablet Commonly known as: BUMEX   Furoscix 80 MG/10ML Ctkt Generic drug: Furosemide        TAKE these medications    acetaminophen 500 MG tablet Commonly known as: TYLENOL Take 500 mg by mouth every 6 (six) hours as needed for moderate pain (pain score 4-6).   amiodarone 200 MG tablet Commonly known as: PACERONE Take 1 tablet (200 mg total) by mouth daily. Take 200 mg by mouth once daily What changed: See the new instructions.   calcitRIOL 0.25 MCG capsule Commonly known as: ROCALTROL Take 2 capsules by mouth daily.   Eliquis 2.5 MG Tabs tablet Generic drug: apixaban TAKE 1 TABLET BY MOUTH TWICE A DAY   Ensure Take 237 mLs by mouth every other day.   folic acid 1 MG tablet Commonly known as: FOLVITE TAKE 1 TABLET BY MOUTH EVERY DAY   hydroxypropyl methylcellulose / hypromellose 2.5 % ophthalmic solution Commonly known as: ISOPTO TEARS / GONIOVISC Place 1 drop into both eyes as needed for dry eyes.   multivitamin tablet Take 1 tablet by mouth 2 (two) times a week.   omeprazole 40 MG capsule Commonly known as: PRILOSEC TAKE 1 CAPSULE (40 MG TOTAL) BY MOUTH DAILY AS NEEDED (FOR ACID REFLUX). What changed: See the new instructions.   potassium chloride 10 MEQ tablet Commonly known as: KLOR-CON M Take 1 tablet (10 mEq total) by mouth daily.   predniSONE 5 MG tablet Commonly known as: DELTASONE Take 1 tablet (5 mg total) by mouth daily with breakfast.   sodium bicarbonate 650 MG tablet Take 650 mg by mouth 2 (two) times daily.   torsemide 20 MG tablet Commonly known as: DEMADEX Take 1 tablet (20 mg total) by mouth every evening.   Torsemide 40 MG Tabs Take 40 mg by mouth every morning. Start taking on: July 17, 2023               Durable Medical Equipment  (From admission, onward)           Start     Ordered   07/12/23 1437  For home use only DME Bedside commode  Once       Question:  Patient needs a bedside commode to treat with the following condition  Answer:  Weakness   07/12/23 1436           Allergies  Allergen  Reactions   Baclofen Nausea And Vomiting   Penicillins Rash    Follow-up Information     [facility] Follow up on 07/22/2023.   Specialty: Cardiology Why: @2 :00PM. Heart failure clinic follow up Contact information: [address] [place] [address] [phone]        [name], [name] Follow up.   Specialty: General Practice Why: LEFT A MESSAGE- NO ANSWER X 5 Contact information: [address] [place] [address] [phone]         [facility] Follow up.   Why: Bedside commode Contact information: [phone]        [facility] Follow up.   Specialty: Home Health Services Why: Agency will contact you to set up apt times. Contact information: [address] [place] [address] [phone]  [facility] Follow up.   Specialty: PALLIATIVE CARE Why: outpatient palliative services Contact information: [address] [facility] [address] [phone]                 The results of significant diagnostics from this hospitalization (including imaging, microbiology, ancillary and laboratory) are listed below for reference.    Significant Diagnostic Studies: DG Chest Port 1 View  Result Date: 07/09/2023 CLINICAL DATA:  Short of breath.  Leg swelling.  Fever. EXAM: PORTABLE CHEST 1 VIEW COMPARISON:  11/04/2022 and older studies. FINDINGS: Moderate enlargement of the cardiac silhouette, stable. No mediastinal or hilar masses. Opacity at the left lung base obscures the hemidiaphragm, similar to the prior exam. Mild prominence of the interstitial markings also stable from prior study. Remainder of the lungs is clear. Possible left pleural effusion. No pneumothorax. Skeletal structures are grossly intact. IMPRESSION: 1. Persistent opacity the left lower lung compared to the exam from 11/04/2022. This is suspected to be a  combination of pleural fluid and atelectasis. Pneumonia should be considered, however, given the patient's current symptoms. 2. Stable cardiomegaly.  Possible mild interstitial edema/CHF. Electronically Signed   By: Amie Portland M.D.   On: 07/09/2023 10:56    Microbiology: No results found for this or any previous visit (from the past 240 hour(s)).   Labs: Basic Metabolic Panel: Recent Labs  Lab 07/11/23 0320 07/12/23 0323 07/13/23 0258 07/14/23 0254 07/15/23 0223 07/16/23 0242  NA 137 138 138 137 135 135  K 4.4 4.1 4.1 3.8 3.6 3.8  CL 105 106 104 101 99 102  CO2 23 23 24 26 23 23   GLUCOSE 127* 115* 113* 120* 200* 102*  BUN 65* 69* 68* 73* 90* 87*  CREATININE 3.64* 3.68* 3.65* 3.91* 4.23* 4.03*  CALCIUM 10.0 9.7 10.1 9.7 9.6 9.7  MG 2.3 2.3 2.2  --   --  2.5*   Liver Function Tests: No results for input(s): "AST", "ALT", "ALKPHOS", "BILITOT", "PROT", "ALBUMIN" in the last 168 hours. No results for input(s): "LIPASE", "AMYLASE" in the last 168 hours. No results for input(s): "AMMONIA" in the last 168 hours. CBC: Recent Labs  Lab 07/10/23 0225 07/16/23 0242  WBC 8.6 7.8  NEUTROABS 6.5  --   HGB 10.1* 11.2*  HCT 32.6* 35.9*  MCV 94.8 92.8  PLT 163 198   Cardiac Enzymes: No results for input(s): "CKTOTAL", "CKMB", "CKMBINDEX", "TROPONINI" in the last 168 hours. BNP: BNP (last 3 results) Recent Labs    07/02/23 1415 07/07/23 1142 07/09/23 1045  BNP >4,500.0* >4,500.0* >4,500.0*    ProBNP (last 3 results) No results for input(s): "PROBNP" in the last 8760 hours.  CBG: No results for input(s): "GLUCAP" in the last 168 hours.     Signed:  Zannie Cove MD.  [facility] 07/16/2023, 11:27 AM

## 2023-07-19 ENCOUNTER — Other Ambulatory Visit (HOSPITAL_COMMUNITY): Payer: Self-pay

## 2023-07-19 ENCOUNTER — Telehealth: Payer: Self-pay

## 2023-07-19 DIAGNOSIS — I083 Combined rheumatic disorders of mitral, aortic and tricuspid valves: Secondary | ICD-10-CM | POA: Diagnosis not present

## 2023-07-19 DIAGNOSIS — D631 Anemia in chronic kidney disease: Secondary | ICD-10-CM | POA: Diagnosis not present

## 2023-07-19 DIAGNOSIS — N179 Acute kidney failure, unspecified: Secondary | ICD-10-CM | POA: Diagnosis not present

## 2023-07-19 DIAGNOSIS — N184 Chronic kidney disease, stage 4 (severe): Secondary | ICD-10-CM | POA: Diagnosis not present

## 2023-07-19 DIAGNOSIS — I5023 Acute on chronic systolic (congestive) heart failure: Secondary | ICD-10-CM | POA: Diagnosis not present

## 2023-07-19 DIAGNOSIS — M0579 Rheumatoid arthritis with rheumatoid factor of multiple sites without organ or systems involvement: Secondary | ICD-10-CM | POA: Diagnosis not present

## 2023-07-19 DIAGNOSIS — I272 Pulmonary hypertension, unspecified: Secondary | ICD-10-CM | POA: Diagnosis not present

## 2023-07-19 DIAGNOSIS — I130 Hypertensive heart and chronic kidney disease with heart failure and stage 1 through stage 4 chronic kidney disease, or unspecified chronic kidney disease: Secondary | ICD-10-CM | POA: Diagnosis not present

## 2023-07-19 DIAGNOSIS — I4892 Unspecified atrial flutter: Secondary | ICD-10-CM | POA: Diagnosis not present

## 2023-07-19 NOTE — Transitions of Care (Post Inpatient/ED Visit) (Addendum)
07/19/2023  Name: Tracy Nelson MRN: [id number] DOB: [birth year]  Today's TOC FU Call Status: Today's TOC FU Call Status:: Successful TOC FU Call Completed TOC FU Call Complete Date: 07/19/23 Patient's Name and Date of Birth confirmed.  Transition Care Management Follow-up Telephone Call Date of Discharge: 07/16/23 Discharge Facility: [facility] Type of Discharge: Inpatient Admission Primary Inpatient Discharge Diagnosis:: "acute CHF" How have you been since you were released from the hospital?: Better ("doing better but didn't sleep well last night-restless legs & a little SOB"-denies any sxs at present-appetite decreased-drinking Ensure every other day due to diarrhea at times-will try Ensure Clear, BM yest, wgt 95lbs, swelling improved per pt) Any questions or concerns?: No  Items Reviewed: Did you receive and understand the discharge instructions provided?: Yes Medications obtained,verified, and reconciled?: Yes (Medications Reviewed) Any new allergies since your discharge?: No Dietary orders reviewed?: Yes Type of Diet Ordered:: low slat/heart healthy Do you have support at home?: Yes People in Home: other relative(s) Name of Support/Comfort Primary Source: pt voices she "lives with family-they work during the day-home alone at [employer] managing okay- has life alert necklace  Medications Reviewed Today: Medications Reviewed Today     Reviewed by Charlyn Minerva, RN (Registered Nurse) on 07/19/23 at 0920  Med List Status: <None>   Medication Order Taking? Sig Documenting Provider Last Dose Status Informant  acetaminophen (TYLENOL) 500 MG tablet [id number] Yes Take 500 mg by mouth every 6 (six) hours as needed for moderate pain (pain score 4-6). [provider] Taking Active Self, Pharmacy Records  amiodarone (PACERONE) 200 MG tablet [id number] Yes Take 1 tablet (200 mg total) by mouth daily. Take 200 mg by mouth once daily Zannie Cove, MD  Taking Active   calcitRIOL (ROCALTROL) 0.25 MCG capsule [id number] Yes Take 2 capsules by mouth daily. [provider] Taking Active Self, Pharmacy Records  ELIQUIS 2.5 MG TABS tablet [id number] Yes TAKE 1 TABLET BY MOUTH TWICE A DAY Laurey Morale, MD Taking Active Self, Pharmacy Records  Ensure [facility]) [id number] Yes Take 237 mLs by mouth every other day. [provider] Taking Active Self, Pharmacy Records  folic acid (FOLVITE) 1 MG tablet [id number] Yes TAKE 1 TABLET BY MOUTH EVERY DAY Laurey Morale, MD Taking Active Self, Pharmacy Records  hydroxypropyl methylcellulose / hypromellose (ISOPTO TEARS / GONIOVISC) 2.5 % ophthalmic solution [id number] Yes Place 1 drop into both eyes as needed for dry eyes. [provider] Taking Active Self, Pharmacy Records  Multiple Vitamin (MULTIVITAMIN) tablet [id number] Yes Take 1 tablet by mouth 2 (two) times a week. [provider] Taking Active Self, Pharmacy Records  omeprazole (PRILOSEC) 40 MG capsule [id number] Yes TAKE 1 CAPSULE (40 MG TOTAL) BY MOUTH DAILY AS NEEDED (FOR ACID REFLUX).  Patient taking differently: Take 40 mg by mouth daily.   [name], [name] Taking Active Self, Pharmacy Records  potassium chloride SA (KLOR-CON M) 10 MEQ tablet [id number] Yes Take 1 tablet (10 mEq total) by mouth daily. Morven, [name], [name] Taking Active Self, Pharmacy Records  predniSONE (DELTASONE) 5 MG tablet [id number] Yes Take 1 tablet (5 mg total) by mouth daily with breakfast. Fuller Plan, MD Taking Active Self, Pharmacy Records  sodium bicarbonate 650 MG tablet [id number] Yes Take 650 mg by mouth 2 (two) times daily. [provider] Taking Active Self, Pharmacy Records  torsemide (DEMADEX) 20 MG tablet [id number] Yes Take 1 tablet (20 mg total) by mouth every evening. Zannie Cove, MD Taking Active  torsemide 40 MG TABS [id number] Yes Take 40 mg by mouth every morning. Zannie Cove, MD Taking Active              Home Care and Equipment/Supplies: Were Home Health Services Ordered?: Yes Name of Home Health Agency:: [facility], pt alo switched from [facility] to [facility]- hasn't heard from them yet-has contact info on d/c papers-will call today to schedule visit Has Agency set up a time to come to your home?: Yes First Home Health Visit Date: 07/19/23 (Pt confirms RN coming today to visit her) Any new equipment or medical supplies ordered?: Yes Name of Medical supply agency?: [facility] Were you able to get the equipment/medical supplies?: Yes Do you have any questions related to the use of the equipment/supplies?: No  Functional Questionnaire: Do you need assistance with bathing/showering or dressing?: No (family assists as needed) Do you need assistance with meal preparation?: Yes Do you need assistance with eating?: No Do you have difficulty maintaining continence: No Do you need assistance with getting out of bed/getting out of a chair/moving?: No Do you have difficulty managing or taking your medications?: No  Follow up appointments reviewed: PCP Follow-up appointment confirmed?: No (Care guide attempted to make PCP appt during call-no openings until mid Jan-pt didn't want to see another provider-will call office) MD Provider Line [phone] Given: No Specialist Hospital Follow-up appointment confirmed?: Yes Date of Specialist follow-up appointment?: 07/22/23 Follow-Up Specialty Provider:: [facility] Do you need transportation to your follow-up appointment?: No (pt voices she will get family/friends to take her to appts) Do you understand care options if your condition(s) worsen?: Yes-patient verbalized understanding  SDOH Interventions Today    Flowsheet Row Most Recent Value  SDOH Interventions   Food Insecurity Interventions Intervention Not Indicated  Housing Interventions Intervention Not Indicated  Transportation Interventions  Patient Declined  [pt voices she has family/friends that take her to appts, discussed with pt about calling insurance to see iff she has transportation benefit-offered to call during this call-she declined-will call on her own at later time]  Utilities Interventions Intervention Not Indicated        TOC interventions discussed/reviewed: -Discussed/reviewed insurance/health plans benefits -Doctor visit discussed/reviewed -PCP -Doctor visits discussed/reviewed-Specialist -Provided Verbal Education: 30-day TOC program, nutrition, meds & their functions, resp/breathing symptom mgmt., fall/safety measures in the home, daily wgt monitoring, s/s of abnormal wgt gain and when to alert provider -Disease mgmt. discussed/reviewed: HF,PAF,HTN  Antionette Fairy, RN,BSN,CCM RN Care Manager Transitions of Care  [facility]  Direct Phone: [phone] Toll Free: [phone] Fax: [phone]

## 2023-07-20 ENCOUNTER — Encounter (HOSPITAL_COMMUNITY)
Admission: RE | Admit: 2023-07-20 | Discharge: 2023-07-20 | Disposition: A | Discharge status: Home / Self Care | Payer: Medicare PPO | Source: Ambulatory Visit | Attending: Nephrology | Admitting: Nephrology

## 2023-07-20 VITALS — BP 99/78 | HR 77 | Temp 97.1°F | Resp 17

## 2023-07-20 DIAGNOSIS — N183 Chronic kidney disease, stage 3 unspecified: Secondary | ICD-10-CM | POA: Insufficient documentation

## 2023-07-20 LAB — IRON AND TIBC
Iron: 84 ug/dL (ref 28–170)
Saturation Ratios: 25 % (ref 10.4–31.8)
TIBC: 339 ug/dL (ref 250–450)
UIBC: 255 ug/dL

## 2023-07-20 LAB — POCT HEMOGLOBIN-HEMACUE: Hemoglobin: 10.6 g/dL — ABNORMAL LOW (ref 12.0–15.0)

## 2023-07-20 LAB — FERRITIN: Ferritin: 781 ng/mL — ABNORMAL HIGH (ref 11–307)

## 2023-07-20 MED ORDER — EPOETIN ALFA-EPBX 10000 UNIT/ML IJ SOLN
10000.0000 [IU] | INTRAMUSCULAR | Status: DC
  Administered 2023-07-20: 10000 [IU] via SUBCUTANEOUS

## 2023-07-20 MED ORDER — EPOETIN ALFA-EPBX 10000 UNIT/ML IJ SOLN
INTRAMUSCULAR | Status: AC
  Filled 2023-07-20: qty 1

## 2023-07-20 NOTE — Addendum Note (Signed)
Addended by: [name] F on: 07/20/2023 12:36 PM   Modules accepted: Level of Service

## 2023-07-22 ENCOUNTER — Telehealth: Payer: Self-pay | Admitting: *Deleted

## 2023-07-22 ENCOUNTER — Ambulatory Visit (HOSPITAL_COMMUNITY)
Admission: RE | Admit: 2023-07-22 | Discharge: 2023-07-22 | Disposition: A | Discharge status: Home / Self Care | Payer: Medicare PPO | Source: Ambulatory Visit | Attending: Family Medicine | Admitting: Family Medicine

## 2023-07-22 ENCOUNTER — Encounter (HOSPITAL_COMMUNITY): Payer: Self-pay

## 2023-07-22 ENCOUNTER — Telehealth: Payer: Self-pay

## 2023-07-22 ENCOUNTER — Other Ambulatory Visit (HOSPITAL_COMMUNITY): Payer: Self-pay

## 2023-07-22 VITALS — BP 100/60 | HR 84 | Wt 102.8 lb

## 2023-07-22 DIAGNOSIS — N184 Chronic kidney disease, stage 4 (severe): Secondary | ICD-10-CM | POA: Insufficient documentation

## 2023-07-22 DIAGNOSIS — I4892 Unspecified atrial flutter: Secondary | ICD-10-CM | POA: Insufficient documentation

## 2023-07-22 DIAGNOSIS — Z7901 Long term (current) use of anticoagulants: Secondary | ICD-10-CM | POA: Diagnosis not present

## 2023-07-22 DIAGNOSIS — D472 Monoclonal gammopathy: Secondary | ICD-10-CM | POA: Insufficient documentation

## 2023-07-22 DIAGNOSIS — M069 Rheumatoid arthritis, unspecified: Secondary | ICD-10-CM | POA: Insufficient documentation

## 2023-07-22 DIAGNOSIS — Z8249 Family history of ischemic heart disease and other diseases of the circulatory system: Secondary | ICD-10-CM | POA: Insufficient documentation

## 2023-07-22 DIAGNOSIS — Z79899 Other long term (current) drug therapy: Secondary | ICD-10-CM | POA: Insufficient documentation

## 2023-07-22 DIAGNOSIS — M6281 Muscle weakness (generalized): Secondary | ICD-10-CM | POA: Diagnosis not present

## 2023-07-22 DIAGNOSIS — I4891 Unspecified atrial fibrillation: Secondary | ICD-10-CM | POA: Diagnosis not present

## 2023-07-22 DIAGNOSIS — I428 Other cardiomyopathies: Secondary | ICD-10-CM | POA: Diagnosis not present

## 2023-07-22 DIAGNOSIS — I5022 Chronic systolic (congestive) heart failure: Secondary | ICD-10-CM | POA: Insufficient documentation

## 2023-07-22 DIAGNOSIS — I480 Paroxysmal atrial fibrillation: Secondary | ICD-10-CM

## 2023-07-22 DIAGNOSIS — S72002D Fracture of unspecified part of neck of left femur, subsequent encounter for closed fracture with routine healing: Secondary | ICD-10-CM | POA: Diagnosis not present

## 2023-07-22 DIAGNOSIS — Z7952 Long term (current) use of systemic steroids: Secondary | ICD-10-CM | POA: Diagnosis not present

## 2023-07-22 DIAGNOSIS — R278 Other lack of coordination: Secondary | ICD-10-CM | POA: Diagnosis not present

## 2023-07-22 DIAGNOSIS — D631 Anemia in chronic kidney disease: Secondary | ICD-10-CM | POA: Insufficient documentation

## 2023-07-22 NOTE — Progress Notes (Signed)
  Care Coordination   Note   07/22/2023 Name: CHELENE SFERRAZZA MRN: [id number] DOB: 31-Dec-1945  COLLINS LYTER is a 77 y.o. year old female who sees [name], [name] for primary care. I reached out to [name] by phone today to offer care coordination services.  Ms. Egerer was given information about Care Coordination services today including:   The Care Coordination services include support from the care team which includes your Nurse Coordinator, Clinical Social Worker, or Pharmacist.  The Care Coordination team is here to help remove barriers to the health concerns and goals most important to you. Care Coordination services are voluntary, and the patient may decline or stop services at any time by request to their care team member.   Care Coordination Consent Status: Patient agreed to services and verbal consent obtained.   Follow up plan:  Telephone appointment with care coordination team member scheduled for:  08/18/23  Encounter Outcome:  Patient Scheduled  [facility] Coordination Care Guide  Direct Dial: [phone]

## 2023-07-22 NOTE — Progress Notes (Signed)
Paramedicine Encounter   Patient ID: CHAUNTELL CREDIT , female,   DOB: 01-Nov-1945,77 y.o.,  MRN: [id number]   Met patient in clinic today with provider.  Weight @ clinic-102 B/P-100/60 P-84 SP02-97 REDS clip-27%   Pt is a new re-referral to paramedicine. She was recently admitted and d/c home on 12/6.  She does have HHN and PT that is starting tomor.  Will see her in the home next week. Looks like her only day avail is Tuesday, she has appointments all of other days of the week.  She does report that eliquis is  $47 and is expensive. She thinks she should have enough to get thru the end of the year.   She reports sob, but states it doesn't feel as bad as before she was admitted.  Some c/p, dull c/p that grabs at her. She does get tired and sob upon doing chores around house.  She does report rt sided pain that lasted approx .   She did get furoscix in the mail -those were ordered prior to her admission  Appetite poor, her taste is off and has to force herself to eat.   Needs txp for appointments. [facility] note shows resources that was mailed. I reached out to them and asked which resources were mailed out.   She goes to [facility] and dr foster is her provider there. She thinks she has appointment next week.   Palliative care is involved also.    Med changes-none right now   Kerry Hough, EMT-Paramedic [phone] 07/22/2023

## 2023-07-22 NOTE — Progress Notes (Addendum)
Advanced Heart Failure Clinic Note   PCP: [name], [name] Nephrology: [name] HF Cardiologist: [name]  77 y.o. with history of rheumatoid arthritis, CKD stage IV, and nonischemic cardiomyopathy was referred by [name] in [place] to establish heart failure care in [place].  Patient has been known to have a cardiomyopathy since 2015.  Cardiac MRI in 2015 showed EF 37% with no LGE.  Coronary angiography at that time showed no significant coronary disease.  Over the next few years, LV EF fluctuated up and down.  In 3/21, echo showed EF down to 20-25%.  She was admitted to the hospital in [place], [place] in 11/21 with CHF exacerbation.  Echo showed EF 20-25%, global hypokinesis, mildly decreased RV systolic function.   She was admitted in 3/22 with atrial fibrillation, converted to NSR on amiodarone. She went into atrial flutter after this and had TEE-DCCV in 4/22.  TEE showed EF < 20%, moderate LV dilation, mild RV dilation with moderate RV dysfunction, moderate central MR.  CPX in 3/22 showed severe HF limitation.  PYP scan 3/23 and likely negative.   She was evaluated for [facility] or ANTHEM, BNP too high and thought to be too frail.   Cardiac MRI in 10/22 showed mild LV dilation with EF 23%, normal RV size with RVEF 37%, ECV 34%, small area of subendocardial LGE in the mid inferolateral wall and small area of full thickness LGE in the basal  inferior wall.   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    Admitted 07/08/22 after mechanical fall resulting in left hip fracture. S/P left intramedullary nail 07/10/22.  Post op developed AKI. Echo showed EF improved to 45%.   Admitted in 1/24 with acute on chronic systolic CHF and AKI. She required milrinone, RHC showed CI 2.81 on milrinone 0.25.  Echo showed EF < 20%.  Suspected end stage HF.   Echo 3/24 EF 20-25%, mild LV dilation, severely decreased RV systolic function, mild RV  enlargement, severe biatrial enlargement, moderate-severe TR, moderate MR  She was admitted 3/13-3/24/24 w/ symptomatic anemia and dyspepsia. Hgb 6.8,  transfused 2 uRBCs. GI recommended conservative management. There was no overt blood loss-FOBT was negative-gastroenterology did not feel endoscopic evaluation was warranted. She was started on Protonix. Hospital course was c/b by volume overload.   Acute visit 05/27/23, mildly volume overloaded and in atrial fibrillation with RVR. Amiodarone increased to 200 mg bid x 1 week. She ultimately chemically converted to NSR and TEE/DCCV aborted.  She was admitted 07/09/23 with acute on chronic CHF and AKI on CKD. Diuresed with IV lasix. Scr bumped to 4.2, improved to 4.0 after diuretic held a couple of days. Palliative care consulted for GOC discussions d/t end-stage HF. She was not ready to consider hospice.  Here today for follow-up. She arrived in a wheelchair. Meeting with Paramedicine for initial visit today. She has been taking medicines as prescribed. Reports a lot of fatigue and intermittent dyspnea. No lower extremity edema. Notes a little bit of abdominal bloating. Watching sodium and fluid intake. Forcing herself to eat, food just doesn't taste right.  She lives with her daughter. Needs help with rides to appointments.   ECG (personally reviewed): SR with PVC and PACs, 86 bpm  ReDs clip: 27%  PMH: 1. GERD 2. Rheumatoid arthritis: Followed by rheumatology in [place].  - No known pulmonary disease from RA.  3. Osteoporosis. 4. Gout 5. CKD stage 4 6. H/o SVT 7. Chronic  systolic CHF: She was found to have nonischemic cardiomyopathy with low EF in 2015.  - LHC (2015): Normal coronaries.  - cardiac MRI (2015): EF 37%, no LGE - Echo (3/21): EF 20-25%. - Echo (11/21): EF 20-25%, moderate LV dilation, global hypokinesis, biatrial enlargement, mildly decreased RV systolic function.  - Echo (12/21): EF < 20%, moderate LV dilation, normal RV  size/systolic function, moderate MR, IVC normal.  - RHC (1/22): Mean RA 1, PA 39/14, mean PCWP 10, PVR 3.9 WU, CI 2.22.  - CPX (3/22): peak VO2 10.1, VE/VCO2 55, RER 1.13.  Severe functional limitation due to HF.  - TEE (4/22): EF < 20%, moderate LV dilation, mild RV dilation with moderate RV dysfunction, moderate central MR. - PYP scan (4/22): grade 1 with H/CL 1.48. Equivocal. Invitae gene testing negative. - Cardiac MRI (10/22): mild LV dilation with EF 23%, normal RV size with RVEF 37%, ECV 34%, small area of subendocardial LGE in the mid inferolateral wall and small area of full thickness LGE in the basal inferior wall.  - PYP scan (3/23): grade 1, H/CL 1.3.  Probably negative.  - Echo 07/11/22: EF 45-50% RV normal  - Echo (3/24): EF 20-25%, mild LV dilation, severely decreased RV systolic function, mild RV enlargement, severe biatrial enlargement, moderate-severe TR, moderate MR, IVC normal.  8. Atrial fibrillation/flutter: TEE-DCCV in 4/22.  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  10. Varicella zoster with post-herpetic neuralgia 11. L hip fracture , mechanical fall.  12. Anemia  SH: From [place] but moved to [place] to live with daughter in 2021.  Has 3 children.  Nonsmoker.  No ETOH.   FH: Mother with CHF, father with MI.   ROS: All systems reviewed and negative except as per HPI.   Current Outpatient Medications  Medication Sig Dispense Refill   acetaminophen (TYLENOL) 500 MG tablet Take 500 mg by mouth every 6 (six) hours as needed for moderate pain (pain score 4-6).     amiodarone (PACERONE) 200 MG tablet Take 1 tablet (200 mg total) by mouth daily. Take 200 mg by mouth once daily     calcitRIOL (ROCALTROL) 0.25 MCG capsule Take 2 capsules by mouth daily.     ELIQUIS 2.5 MG TABS tablet TAKE 1 TABLET BY MOUTH TWICE A DAY 60 tablet 5   Ensure (ENSURE) Take 237 mLs by mouth every other day.     folic acid (FOLVITE) 1 MG tablet TAKE 1 TABLET BY MOUTH  EVERY DAY 30 tablet 11   hydroxypropyl methylcellulose / hypromellose (ISOPTO TEARS / GONIOVISC) 2.5 % ophthalmic solution Place 1 drop into both eyes as needed for dry eyes.     Multiple Vitamin (MULTIVITAMIN) tablet Take 1 tablet by mouth 2 (two) times a week.     omeprazole (PRILOSEC) 40 MG capsule TAKE 1 CAPSULE (40 MG TOTAL) BY MOUTH DAILY AS NEEDED (FOR ACID REFLUX). 90 capsule 1   potassium chloride SA (KLOR-CON M) 10 MEQ tablet Take 1 tablet (10 mEq total) by mouth daily. 30 tablet 11   predniSONE (DELTASONE) 5 MG tablet Take 1 tablet (5 mg total) by mouth daily with breakfast. 90 tablet 1   sodium bicarbonate 650 MG tablet Take 650 mg by mouth 2 (two) times daily.     torsemide (DEMADEX) 20 MG tablet Take 1 tablet (20 mg total) by mouth every evening. 1 tablet 0   torsemide 40 MG TABS Take 40 mg by mouth every morning. 90 tablet 1   No  current facility-administered medications for this encounter.   Wt Readings from Last 3 Encounters:  07/22/23 46.6 kg (102 lb 12.8 oz)  07/16/23 46.9 kg (103 lb 6.3 oz)  07/02/23 51.4 kg (113 lb 6.4 oz)   BP 100/60   Pulse 84   Wt 46.6 kg (102 lb 12.8 oz)   SpO2 97%   BMI 18.21 kg/m   PHYSICAL EXAM: General:  Cachectic, frail elderly female. Arrived in wheelchair. HEENT: normal Neck: supple. no JVD. Carotids 2+ bilat; no bruits.  Cor: PMI nondisplaced. Regular rate & rhythm. No rubs, gallops or murmurs. Lungs: clear Abdomen: soft, nontender, nondistended.  Extremities: no cyanosis, clubbing, rash, edema Neuro: alert & orientedx3. Affect pleasant   Assessment/Plan: 1. Chronic Systolic CHF: Nonischemic cardiomyopathy.  This has been known since 2015, cath in 2015 showed no coronary disease and cardiac MRI in 2015 showed no LGE.  Cause is uncertain, familial cardiomyopathy is a concern given nonischemic cardiomyopathy in her mother.  Cannot rule out remote viral myocarditis. RHC (1/22) showed low filling pressures and actually a relatively  preserved cardiac index of 2.22.  PYP scan in 3/23 likely negative.  Cardiac MRI in 10/22 showed mild LV dilation with EF 23%, normal RV size with RVEF 37%, ECV 34%, small area of subendocardial LGE in the mid inferolateral wall and small area of full thickness LGE in the basal inferior wall.   This was not suggestive of cardiac amyloidosis but possibly could be consistent with coronary embolism (though no LV thrombus visualized) versus prior myocarditis versus sarcoidosis.  Invitae gene testing for TTR mutations was negative. Cardiac MRI not consistent with amyloidosis.  She has MGUS with monoclonal IgG paraprotein, but based on cMRI and slow progression, think AL amyloidosis is unlikely.  Most recent echo 3/24 with EF 20-25%, mild LV dilation, severely decreased RV systolic function, mild RV enlargement, severe biatrial enlargement, moderate-severe TR, moderate MR. She was admitted in 1/24 and required milrinone.  - Chronically NYHA IV. Volume stable on exam, ReDS 27%.  - Continue Torsemide 40 mg q am, 20 mg q pm.  - Wants to hold off on labs today d/t frequent sticks during recent admit, states she will likely have labs at follow-up with Nephrology next week. - Off all GDMT with hypotension and CKD - Does not have an ICD, would not place with end stage HF.   2. CKD: Stage 4.  She is followed by [name].  - Most recent SCr averaging mid 3s, 4.0 at hospital discharge. 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 3. Rheumatoid arthritis: No history of lung involvement. She has been on a low dose of prednisone chronically. Now off Imuran, felt to be contributing to anemia  - She follows with Dr. Dimple Casey  4. Atrial fibrillation/flutter: S/p TEE-DCCV in 4/22. She is now on amiodarone.  She was in atypical atrial flutter 10/24. She converted to NSR and is in NSR on ECG today.  She does not tolerate atrial arrhythmias. - Continue amiodarone 200 mg daily.  Recent LFTs and TSH okay. - Continue Eliquis 5 mg bid. No bleeding  issues.  5. Anemia: She is receiving iron infusions per nephrology. - Recent CBC stable.  6. GOC: Seen by Palliative Care during recent admit and referred for outpatient Palliative. Not ready for hospice. Remains Full Code.  Appreciate Paramedicine. Will engage HF CSW to assist with transportation to and from appointments.  Follow up: 2-3 weeks with APP, 6 week with [name]  [facility], Dalbert Garnet, PA-C 07/22/2023

## 2023-07-22 NOTE — Progress Notes (Signed)
CSW met with patient to discuss transportation services. Patient reports she needs a ride to clinic appointments. CSW suggested contacting member services for [facility] to clarify her benefits and if limited or no benefits to contact clinic staff. Patient outside of the area for [place]. CSW available as needed. Lasandra Beech, LCSW, CCSW-MCS [phone]

## 2023-07-22 NOTE — Telephone Encounter (Signed)
   Telephone encounter was:  Successful.  07/22/2023 Name: Tracy Nelson MRN: [id number] DOB: 1946/01/26  Tracy Nelson is a 77 y.o. year old female who is a primary care patient of [name], [name] . The community resource team was consulted for assistance with Transportation Needs   Care guide performed the following interventions: Patient provided with information about care guide support team and interviewed to confirm resource needs.Patient requested I mail transportation resources to her to use in the future. Pt was using [facility] transortation before for her medical needs   Follow Up Plan:  Care guide will follow up with patient by phone over the next day    [facility]  [facility], [facility] Guide, Phone: [phone] Website: [email or web address]

## 2023-07-22 NOTE — Progress Notes (Signed)
ReDS Vest / Clip - 07/22/23 1400       ReDS Vest / Clip   Station Marker A    Ruler Value 28    ReDS Value Range Low volume    ReDS Actual Value 27

## 2023-07-22 NOTE — Patient Instructions (Signed)
Medication Changes:  None, continue current medications  Special Instructions // Education:  Do the following things EVERYDAY: Weigh yourself in the morning before breakfast. Write it down and keep it in a log. Take your medicines as prescribed Eat low salt foods--Limit salt (sodium) to 2000 mg per day.  Stay as active as you can everyday Limit all fluids for the day to less than 2 liters   Follow-Up in: 3-4 weeks and again in 2 months   At the Advanced Heart Failure Clinic, you and your health needs are our priority. We have a designated team specialized in the treatment of Heart Failure. This Care Team includes your primary Heart Failure Specialized Cardiologist (physician), Advanced Practice Providers (APPs- Physician Assistants and Nurse Practitioners), and Pharmacist who all work together to provide you with the care you need, when you need it.   You may see any of the following providers on your designated Care Team at your next follow up:  Dr. Arvilla Meres Dr. Marca Ancona Dr. Dorthula Nettles Dr. Theresia Bough Tonye Becket, NP Robbie Lis, [place] [employer] [facility] [employer] [facility] [place], [place] Brynda Peon, NP [place] Lee, NP Karle Plumber, PharmD   Please be sure to bring in all your medications bottles to every appointment.   Need to Contact us:  If you have any questions or concerns before your next appointment please send [place] a message through [place] or call our office at [phone].    TO LEAVE A MESSAGE FOR THE NURSE SELECT OPTION 2, PLEASE LEAVE A MESSAGE INCLUDING: YOUR NAME DATE OF BIRTH CALL BACK NUMBER REASON FOR CALL**this is important as we prioritize the call backs  YOU WILL RECEIVE A CALL BACK THE SAME DAY AS LONG AS YOU CALL BEFORE 4:00 PM

## 2023-07-23 ENCOUNTER — Telehealth: Payer: Self-pay

## 2023-07-23 DIAGNOSIS — I130 Hypertensive heart and chronic kidney disease with heart failure and stage 1 through stage 4 chronic kidney disease, or unspecified chronic kidney disease: Secondary | ICD-10-CM | POA: Diagnosis not present

## 2023-07-23 DIAGNOSIS — I272 Pulmonary hypertension, unspecified: Secondary | ICD-10-CM | POA: Diagnosis not present

## 2023-07-23 DIAGNOSIS — N179 Acute kidney failure, unspecified: Secondary | ICD-10-CM | POA: Diagnosis not present

## 2023-07-23 DIAGNOSIS — D631 Anemia in chronic kidney disease: Secondary | ICD-10-CM | POA: Diagnosis not present

## 2023-07-23 DIAGNOSIS — M0579 Rheumatoid arthritis with rheumatoid factor of multiple sites without organ or systems involvement: Secondary | ICD-10-CM | POA: Diagnosis not present

## 2023-07-23 DIAGNOSIS — I4892 Unspecified atrial flutter: Secondary | ICD-10-CM | POA: Diagnosis not present

## 2023-07-23 DIAGNOSIS — I5023 Acute on chronic systolic (congestive) heart failure: Secondary | ICD-10-CM | POA: Diagnosis not present

## 2023-07-23 DIAGNOSIS — N184 Chronic kidney disease, stage 4 (severe): Secondary | ICD-10-CM | POA: Diagnosis not present

## 2023-07-23 DIAGNOSIS — I083 Combined rheumatic disorders of mitral, aortic and tricuspid valves: Secondary | ICD-10-CM | POA: Diagnosis not present

## 2023-07-23 NOTE — Telephone Encounter (Signed)
   Telephone encounter was:  Successful.  07/23/2023 Name: Tracy Nelson MRN: [id number] DOB: 1946/04/22  [name] is a 77 y.o. year old female who is a primary care patient of [name], [name] . The community resource team was consulted for assistance with Transportation Needs   Care guide performed the following interventions: Patient provided with information about care guide support team and interviewed to confirm resource needs.[place] Access transportation information, [employer], [place] Part [place] and [place] Application, TAM Application. Corporate treasurer per [employer].  [facility] Access [place] - [phone] [employer]/[place] [employer] [phone] [facility]) [place] [employer] - [phone] (Non-Medicaid, under 60) Acupuncturist drivers - [phone]/IEPPIRJJOA, [phone] [employer] Access Demand Response Transportation [phone] elderly [address] & older [place] [employer] [facility] [phone]  Follow Up Plan:  No further follow up planned at this time. The patient has been provided with needed resources.   [name]  [facility], [facility] Guide, Phone: [phone] Website: [email or web address]

## 2023-07-23 NOTE — Addendum Note (Signed)
Encounter addended by: Andrey Farmer, PA-C on: 07/23/2023 3:28 PM  Actions taken: Clinical Note Signed

## 2023-07-26 ENCOUNTER — Encounter (HOSPITAL_COMMUNITY)
Admission: RE | Admit: 2023-07-26 | Discharge: 2023-07-26 | Disposition: A | Discharge status: Home / Self Care | Payer: Medicare PPO | Source: Ambulatory Visit | Attending: Nephrology

## 2023-07-26 ENCOUNTER — Other Ambulatory Visit (HOSPITAL_COMMUNITY): Payer: Self-pay

## 2023-07-26 DIAGNOSIS — N183 Chronic kidney disease, stage 3 unspecified: Secondary | ICD-10-CM | POA: Diagnosis not present

## 2023-07-26 MED ORDER — SODIUM CHLORIDE 0.9 % IV SOLN
510.0000 mg | Freq: Once | INTRAVENOUS | Status: AC
  Administered 2023-07-26: 510 mg via INTRAVENOUS
  Filled 2023-07-26: qty 510

## 2023-07-26 NOTE — Progress Notes (Signed)
Paramedicine Encounter    [name], female    DOB: 1946-05-28, 77 y.o.   MRN: [id number]   Complaints-same/nothing new or abnormal   Edema-none   Compliance with meds-yes   Pill box filled-yes pt fills it up  If so, by whom-she fills it -she didn't feel like getting up to get the meds and pill box. Will def have to check it next visit.   Refills needed-none   Pt reports she is doing about the same as when she was seen in the clinic last week. Sob comes and goes. She reports she got bloated yesterday and had abd swelling but it went away today.  She went to get her iron infusion.  She denies any dizziness.  We reviewed her diet/fluid intake for the day.  She fills her own pill box but this time she is really tired and doesn't want to go back to her room across the house to get it for me to review.   Since next week is Christmas I will f/u with her the week after.   BP 110/72   Pulse 90   Resp 18   Wt 93 lb (42.2 kg)   SpO2 96%   BMI 16.47 kg/m  Weight yesterday--95 -she felt bloated yesterday  Last visit weight-100 @ clinic   Patient Care Team: [name], [name] as PCP - General (General Practice) Burna Sis, LCSW as Social Worker (Licensed Clinical Social Worker) Physiological scientist, Pershing Cox, RPH (Inactive) (Pharmacist) Laurey Morale, MD as Consulting Physician (Cardiology)  Patient Active Problem List   Diagnosis Date Noted   Acute clinical systolic heart failure (HCC) 07/10/2023   Acute on chronic systolic CHF (congestive heart failure) (HCC) 07/10/2023   CHF exacerbation (HCC) 07/09/2023   Elevated troponin 07/09/2023   PAF (paroxysmal atrial fibrillation) (HCC) 06/08/2023   Influenza vaccination declined 06/08/2023   Herpes zoster vaccination declined 06/08/2023   COVID-19 vaccination declined 06/08/2023   Bumps on skin 02/03/2023   Acquired thrombophilia (HCC) 02/03/2023   Prediabetes 01/13/2023   Prolonged QT interval 10/30/2022   Cataract  09/14/2022   Uterine prolapse 09/14/2022   Pressure ulcer 09/03/2022   Acute heart failure with reduced ejection fraction (HFrEF, <= 40%) (HCC) 08/30/2022   Anemia of chronic disease 08/30/2022   Chronic systolic CHF (congestive heart failure) (HCC) 07/13/2022   CKD (chronic kidney disease), stage IV (HCC) 07/09/2022   Corneal melt, 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   Monoclonal gammopathy 03/10/2021   Atypical chest pain 10/22/2020   CKD (chronic kidney disease), stage III (HCC) 10/22/2020   Atrial flutter (HCC) 10/18/2020   Arthritis 07/15/2020   GERD (gastroesophageal reflux disease) 07/15/2020   Non-ischemic cardiomyopathy (HCC) 07/15/2020   HFrEF (heart failure with reduced ejection fraction) (HCC) 07/15/2020   HTN (hypertension) 07/15/2020   Osteoporosis 07/15/2020   Raynaud's disease 05/05/2019   Rheumatoid arthritis involving multiple sites with positive rheumatoid factor (HCC) 10/28/2015   Mitral regurgitation 05/30/2014   Vitamin D deficiency 05/15/2013   Encounter for long-term (current) use of other medications 12/14/2008   Sjogren's syndrome (HCC) 12/14/2008    Current Outpatient Medications:    acetaminophen (TYLENOL) 500 MG tablet, Take 500 mg by mouth every 6 (six) hours as needed for moderate pain (pain score 4-6)., Disp: , Rfl:    amiodarone (PACERONE) 200 MG tablet, Take 1 tablet (200 mg total) by mouth daily. Take 200 mg by mouth once daily, Disp: , Rfl:    calcitRIOL (ROCALTROL) 0.25 MCG capsule, Take 2  capsules by mouth daily., Disp: , Rfl:    ELIQUIS 2.5 MG TABS tablet, TAKE 1 TABLET BY MOUTH TWICE A DAY, Disp: 60 tablet, Rfl: 5   Ensure (ENSURE), Take 237 mLs by mouth every other day., Disp: , Rfl:    folic acid (FOLVITE) 1 MG tablet, TAKE 1 TABLET BY MOUTH EVERY DAY, Disp: 30 tablet, Rfl: 11   hydroxypropyl methylcellulose / hypromellose (ISOPTO TEARS / GONIOVISC) 2.5 % ophthalmic solution, Place 1 drop into both eyes as needed for dry eyes., Disp: , Rfl:    Multiple  Vitamin (MULTIVITAMIN) tablet, Take 1 tablet by mouth 2 (two) times a week., Disp: , Rfl:    omeprazole (PRILOSEC) 40 MG capsule, TAKE 1 CAPSULE (40 MG TOTAL) BY MOUTH DAILY AS NEEDED (FOR ACID REFLUX)., Disp: 90 capsule, Rfl: 1   potassium chloride SA (KLOR-CON M) 10 MEQ tablet, Take 1 tablet (10 mEq total) by mouth daily., Disp: 30 tablet, Rfl: 11   predniSONE (DELTASONE) 5 MG tablet, Take 1 tablet (5 mg total) by mouth daily with breakfast., Disp: 90 tablet, Rfl: 1   sodium bicarbonate 650 MG tablet, Take 650 mg by mouth 2 (two) times daily., Disp: , Rfl:    torsemide (DEMADEX) 20 MG tablet, Take 1 tablet (20 mg total) by mouth every evening., Disp: 1 tablet, Rfl: 0   torsemide 40 MG TABS, Take 40 mg by mouth every morning., Disp: 90 tablet, Rfl: 1 Allergies  Allergen Reactions   Baclofen Nausea And Vomiting   Penicillins Rash      Social History   Socioeconomic History   Marital status: Widowed    Spouse name: Not on file   Number of children: Not on file   Years of education: 16   Highest education level: Bachelor's degree (e.g., BA, AB, BS)  Occupational History   Occupation: Retired  Tobacco Use   Smoking status: Never    Passive exposure: Past   Smokeless tobacco: Never  Vaping Use   Vaping status: Never Used  Substance and Sexual Activity   Alcohol use: Never   Drug use: Never   Sexual activity: Not Currently  Other Topics Concern   Not on file  Social History Narrative   Not on file   Social Drivers of Health   Financial Resource Strain: Low Risk  (06/03/2023)   Overall Financial Resource Strain (CARDIA)    Difficulty of Paying Living Expenses: Not hard at all  Food Insecurity: No Food Insecurity (07/19/2023)   Hunger Vital Sign    Worried About Running Out of Food in the Last Year: Never true    Ran Out of Food in the Last Year: Never true  Transportation Needs: Unmet Transportation Needs (07/19/2023)   PRAPARE - Administrator, Civil Service  (Medical): Yes    Lack of Transportation (Non-Medical): No  Physical Activity: Inactive (06/03/2023)   Exercise Vital Sign    Days of Exercise per Week: 0 days    Minutes of Exercise per Session: 0 min  Stress: No Stress Concern Present (06/03/2023)   [employer] of Occupational Health - Occupational Stress Questionnaire    Feeling of Stress : Only a little  Social Connections: Moderately Isolated (06/03/2023)   Social Connection and Isolation Panel [NHANES]    Frequency of Communication with Friends and Family: More than three times a week    Frequency of Social Gatherings with Friends and Family: Never    Attends Religious Services: More than 4 times per year  Active Member of Clubs or Organizations: No    Attends Banker Meetings: Never    Marital Status: Widowed  Intimate Partner Violence: Not At Risk (07/19/2023)   Humiliation, Afraid, Rape, and Kick questionnaire    Fear of Current or Ex-Partner: No    Emotionally Abused: No    Physically Abused: No    Sexually Abused: No    Physical Exam      Future Appointments  Date Time Provider Department Center  07/28/2023  4:00 PM [name], [name] [facility] None  07/29/2023  8:15 AM Vivi Barrack, DPM [facility]  08/17/2023 11:00 AM [facility] ROOM [facility] None  08/18/2023 11:00 AM Little, Karma Lew, RN [facility] None  08/19/2023 12:00 PM [facility] PA/NP [facility] None  09/07/2023 11:00 AM Laurey Morale, MD [facility] None  09/30/2023 10:40 AM Dimple Casey, Jamesetta Orleans, MD [facility] None       Kerry Hough, Paramedic [phone] [facility] Paramedic  07/26/23

## 2023-07-27 ENCOUNTER — Encounter (HOSPITAL_COMMUNITY): Payer: Medicare PPO

## 2023-07-27 DIAGNOSIS — M1000 Idiopathic gout, unspecified site: Secondary | ICD-10-CM | POA: Diagnosis not present

## 2023-07-27 DIAGNOSIS — D631 Anemia in chronic kidney disease: Secondary | ICD-10-CM | POA: Diagnosis not present

## 2023-07-27 DIAGNOSIS — N179 Acute kidney failure, unspecified: Secondary | ICD-10-CM | POA: Diagnosis not present

## 2023-07-27 DIAGNOSIS — N185 Chronic kidney disease, stage 5: Secondary | ICD-10-CM | POA: Diagnosis not present

## 2023-07-27 DIAGNOSIS — I428 Other cardiomyopathies: Secondary | ICD-10-CM | POA: Diagnosis not present

## 2023-07-27 DIAGNOSIS — M069 Rheumatoid arthritis, unspecified: Secondary | ICD-10-CM | POA: Diagnosis not present

## 2023-07-27 DIAGNOSIS — N2581 Secondary hyperparathyroidism of renal origin: Secondary | ICD-10-CM | POA: Diagnosis not present

## 2023-07-27 DIAGNOSIS — N189 Chronic kidney disease, unspecified: Secondary | ICD-10-CM | POA: Diagnosis not present

## 2023-07-27 DIAGNOSIS — M810 Age-related osteoporosis without current pathological fracture: Secondary | ICD-10-CM | POA: Diagnosis not present

## 2023-07-28 ENCOUNTER — Telehealth: Payer: Medicare PPO | Admitting: Nurse Practitioner

## 2023-07-28 DIAGNOSIS — I4892 Unspecified atrial flutter: Secondary | ICD-10-CM | POA: Diagnosis not present

## 2023-07-28 DIAGNOSIS — I5023 Acute on chronic systolic (congestive) heart failure: Secondary | ICD-10-CM

## 2023-07-28 DIAGNOSIS — N898 Other specified noninflammatory disorders of vagina: Secondary | ICD-10-CM

## 2023-07-28 DIAGNOSIS — I272 Pulmonary hypertension, unspecified: Secondary | ICD-10-CM | POA: Diagnosis not present

## 2023-07-28 DIAGNOSIS — I083 Combined rheumatic disorders of mitral, aortic and tricuspid valves: Secondary | ICD-10-CM | POA: Diagnosis not present

## 2023-07-28 DIAGNOSIS — D631 Anemia in chronic kidney disease: Secondary | ICD-10-CM | POA: Diagnosis not present

## 2023-07-28 DIAGNOSIS — Z09 Encounter for follow-up examination after completed treatment for conditions other than malignant neoplasm: Secondary | ICD-10-CM

## 2023-07-28 DIAGNOSIS — N184 Chronic kidney disease, stage 4 (severe): Secondary | ICD-10-CM | POA: Diagnosis not present

## 2023-07-28 DIAGNOSIS — I130 Hypertensive heart and chronic kidney disease with heart failure and stage 1 through stage 4 chronic kidney disease, or unspecified chronic kidney disease: Secondary | ICD-10-CM | POA: Diagnosis not present

## 2023-07-28 DIAGNOSIS — M0579 Rheumatoid arthritis with rheumatoid factor of multiple sites without organ or systems involvement: Secondary | ICD-10-CM | POA: Diagnosis not present

## 2023-07-28 DIAGNOSIS — N179 Acute kidney failure, unspecified: Secondary | ICD-10-CM | POA: Diagnosis not present

## 2023-07-28 LAB — LAB REPORT - SCANNED: EGFR: 13

## 2023-07-28 MED ORDER — FLUCONAZOLE 100 MG PO TABS: 100.0000 mg | ORAL_TABLET | Freq: Every day | ORAL | 0 refills | Status: DC

## 2023-07-28 NOTE — Progress Notes (Unsigned)
Virtual Visit via Video Note  Tracy Nelson, CMA,acting as a scribe for [name], [name].,have documented all relevant documentation on the behalf of [name], [name],as directed by  [name], [name] while in the presence of [name], [name].  I connected with Tracy Nelson on 07/28/23 at  4:00 PM EST by a video enabled telemedicine application and verified that I am speaking with the correct person using two identifiers.  Patient Location: Home Provider Location: Office/Clinic  I discussed the limitations, risks, security, and privacy concerns of performing an evaluation and management service by video and the availability of in person appointments. I also discussed with the patient that there may be a patient responsible charge related to this service. The patient expressed understanding and agreed to proceed.  Subjective: PCP: [name], [name]  Chief Complaint  Patient presents with   Hospitalization Follow-up   Patient presents today for a hospital follow up, Patient reports compliance with medication. Patient denies any chest pain, SOB, or headaches. Patient has no concerns today. Patient was admitted on 07/09/2023 and discharged on 07/16/2023 patient was diagnosed with Acute on chronic systolic CHF (congestive heart failure) - patient reports she's not feeling any worse. Patient has already followed up with cardiologist. Her breathing is not worse. She is now on torsemide. She has also seen by Nephrology who feels she can go a little longer but wants her to take the peritoneal dialysis. She has a Designer, industrial/product, HHA and PT. No open areas or wounds on her body. She has been wearing briefs and thinks this could be causing vaginal irritation.   -Echo with EF 20 to 25 %, global hypokinesis, RV systolic function with severe reduction, moderate mitral valve regurgitation, moderate to severe tricuspid valve regurgitation,  -diuresed w/ IV lasix, changed to torsemide, complicated by  worsening kidney function -GDMT limited by CKD 4, soft blood pressures -Followed by advanced heart failure team, prognosis is very poor with end-stage BiV failure and progressive CKD, hospice was recommended, seen by palliative care, patient is not ready for hospice yet, she is at high risk of quick readmission -Transition to torsemide today,     ROS: Per HPI  Current Outpatient Medications:    acetaminophen (TYLENOL) 500 MG tablet, Take 500 mg by mouth every 6 (six) hours as needed for moderate pain (pain score 4-6)., Disp: , Rfl:    amiodarone (PACERONE) 200 MG tablet, Take 1 tablet (200 mg total) by mouth daily. Take 200 mg by mouth once daily, Disp: , Rfl:    calcitRIOL (ROCALTROL) 0.25 MCG capsule, Take 2 capsules by mouth daily., Disp: , Rfl:    ELIQUIS 2.5 MG TABS tablet, TAKE 1 TABLET BY MOUTH TWICE A DAY, Disp: 60 tablet, Rfl: 5   Ensure (ENSURE), Take 237 mLs by mouth every other day., Disp: , Rfl:    fluconazole (DIFLUCAN) 100 MG tablet, Take 1 tablet (100 mg total) by mouth daily. Take 1 tablet by mouth now repeat in 5 days, Disp: 2 tablet, Rfl: 0   folic acid (FOLVITE) 1 MG tablet, TAKE 1 TABLET BY MOUTH EVERY DAY, Disp: 30 tablet, Rfl: 11   hydroxypropyl methylcellulose / hypromellose (ISOPTO TEARS / GONIOVISC) 2.5 % ophthalmic solution, Place 1 drop into both eyes as needed for dry eyes., Disp: , Rfl:    Multiple Vitamin (MULTIVITAMIN) tablet, Take 1 tablet by mouth 2 (two) times a week., Disp: , Rfl:    omeprazole (PRILOSEC) 40 MG capsule, TAKE 1 CAPSULE (40  MG TOTAL) BY MOUTH DAILY AS NEEDED (FOR ACID REFLUX)., Disp: 90 capsule, Rfl: 1   potassium chloride SA (KLOR-CON M) 10 MEQ tablet, Take 1 tablet (10 mEq total) by mouth daily., Disp: 30 tablet, Rfl: 11   predniSONE (DELTASONE) 5 MG tablet, Take 1 tablet (5 mg total) by mouth daily with breakfast., Disp: 90 tablet, Rfl: 1   sodium bicarbonate 650 MG tablet, Take 650 mg by mouth 2 (two) times daily., Disp: , Rfl:     torsemide (DEMADEX) 20 MG tablet, Take 1 tablet (20 mg total) by mouth every evening., Disp: 1 tablet, Rfl: 0   torsemide 40 MG TABS, Take 40 mg by mouth every morning., Disp: 90 tablet, Rfl: 1  Observations/Objective: There were no vitals filed for this visit. Physical Exam Vitals reviewed.  Constitutional:      General: She is not in acute distress.    Appearance: Normal appearance.  Pulmonary:     Effort: Pulmonary effort is normal. No respiratory distress.  Neurological:     General: No focal deficit present.     Mental Status: She is alert and oriented to person, place, and time.     Cranial Nerves: No cranial nerve deficit.     Motor: No weakness.     Assessment and Plan: Acute on chronic systolic CHF (congestive heart failure) [facility])  Hospital discharge follow-up  Vaginal irritation -     Fluconazole; Take 1 tablet (100 mg total) by mouth daily. Take 1 tablet by mouth now repeat in 5 days  Dispense: 2 tablet; Refill: 0    Follow Up Instructions: Return for bp 4- 6 months check.   I discussed the assessment and treatment plan with the patient. The patient was provided an opportunity to ask questions, and all were answered. The patient agreed with the plan and demonstrated an understanding of the instructions.   The patient was advised to call back or seek an in-person evaluation if the symptoms worsen or if the condition fails to improve as anticipated.  The above assessment and management plan was discussed with the patient. The patient verbalized understanding of and has agreed to the management plan.   [name], [name], have reviewed all documentation for this visit. The documentation on 07/28/23 for the exam, diagnosis, procedures, and orders are all accurate and complete.

## 2023-07-29 ENCOUNTER — Ambulatory Visit: Payer: Medicare PPO | Admitting: Podiatry

## 2023-07-29 ENCOUNTER — Encounter: Payer: Self-pay | Admitting: Podiatry

## 2023-07-29 DIAGNOSIS — R636 Underweight: Secondary | ICD-10-CM | POA: Diagnosis not present

## 2023-07-29 DIAGNOSIS — K760 Fatty (change of) liver, not elsewhere classified: Secondary | ICD-10-CM | POA: Diagnosis not present

## 2023-07-29 DIAGNOSIS — I4892 Unspecified atrial flutter: Secondary | ICD-10-CM | POA: Diagnosis not present

## 2023-07-29 DIAGNOSIS — M810 Age-related osteoporosis without current pathological fracture: Secondary | ICD-10-CM | POA: Diagnosis not present

## 2023-07-29 DIAGNOSIS — N179 Acute kidney failure, unspecified: Secondary | ICD-10-CM | POA: Diagnosis not present

## 2023-07-29 DIAGNOSIS — M79675 Pain in left toe(s): Secondary | ICD-10-CM | POA: Diagnosis not present

## 2023-07-29 DIAGNOSIS — M109 Gout, unspecified: Secondary | ICD-10-CM | POA: Diagnosis not present

## 2023-07-29 DIAGNOSIS — Z86718 Personal history of other venous thrombosis and embolism: Secondary | ICD-10-CM | POA: Diagnosis not present

## 2023-07-29 DIAGNOSIS — L84 Corns and callosities: Secondary | ICD-10-CM

## 2023-07-29 DIAGNOSIS — I5023 Acute on chronic systolic (congestive) heart failure: Secondary | ICD-10-CM | POA: Diagnosis not present

## 2023-07-29 DIAGNOSIS — Z7952 Long term (current) use of systemic steroids: Secondary | ICD-10-CM | POA: Diagnosis not present

## 2023-07-29 DIAGNOSIS — K5900 Constipation, unspecified: Secondary | ICD-10-CM | POA: Diagnosis not present

## 2023-07-29 DIAGNOSIS — D631 Anemia in chronic kidney disease: Secondary | ICD-10-CM | POA: Diagnosis not present

## 2023-07-29 DIAGNOSIS — M79674 Pain in right toe(s): Secondary | ICD-10-CM

## 2023-07-29 DIAGNOSIS — I495 Sick sinus syndrome: Secondary | ICD-10-CM | POA: Diagnosis not present

## 2023-07-29 DIAGNOSIS — M2040 Other hammer toe(s) (acquired), unspecified foot: Secondary | ICD-10-CM | POA: Diagnosis not present

## 2023-07-29 DIAGNOSIS — E785 Hyperlipidemia, unspecified: Secondary | ICD-10-CM | POA: Diagnosis not present

## 2023-07-29 DIAGNOSIS — I130 Hypertensive heart and chronic kidney disease with heart failure and stage 1 through stage 4 chronic kidney disease, or unspecified chronic kidney disease: Secondary | ICD-10-CM | POA: Diagnosis not present

## 2023-07-29 DIAGNOSIS — E854 Organ-limited amyloidosis: Secondary | ICD-10-CM | POA: Diagnosis not present

## 2023-07-29 DIAGNOSIS — I272 Pulmonary hypertension, unspecified: Secondary | ICD-10-CM | POA: Diagnosis not present

## 2023-07-29 DIAGNOSIS — I083 Combined rheumatic disorders of mitral, aortic and tricuspid valves: Secondary | ICD-10-CM | POA: Diagnosis not present

## 2023-07-29 DIAGNOSIS — Z7901 Long term (current) use of anticoagulants: Secondary | ICD-10-CM

## 2023-07-29 DIAGNOSIS — M3500 Sicca syndrome, unspecified: Secondary | ICD-10-CM | POA: Diagnosis not present

## 2023-07-29 DIAGNOSIS — E8581 Light chain (AL) amyloidosis: Secondary | ICD-10-CM | POA: Diagnosis not present

## 2023-07-29 DIAGNOSIS — M545 Low back pain, unspecified: Secondary | ICD-10-CM | POA: Diagnosis not present

## 2023-07-29 DIAGNOSIS — N184 Chronic kidney disease, stage 4 (severe): Secondary | ICD-10-CM | POA: Diagnosis not present

## 2023-07-29 DIAGNOSIS — B351 Tinea unguium: Secondary | ICD-10-CM | POA: Diagnosis not present

## 2023-07-29 DIAGNOSIS — D529 Folate deficiency anemia, unspecified: Secondary | ICD-10-CM | POA: Diagnosis not present

## 2023-07-29 DIAGNOSIS — K224 Dyskinesia of esophagus: Secondary | ICD-10-CM | POA: Diagnosis not present

## 2023-07-29 DIAGNOSIS — N2581 Secondary hyperparathyroidism of renal origin: Secondary | ICD-10-CM | POA: Diagnosis not present

## 2023-07-29 DIAGNOSIS — K219 Gastro-esophageal reflux disease without esophagitis: Secondary | ICD-10-CM | POA: Diagnosis not present

## 2023-07-29 DIAGNOSIS — M0579 Rheumatoid arthritis with rheumatoid factor of multiple sites without organ or systems involvement: Secondary | ICD-10-CM | POA: Diagnosis not present

## 2023-07-29 DIAGNOSIS — I255 Ischemic cardiomyopathy: Secondary | ICD-10-CM | POA: Diagnosis not present

## 2023-07-29 DIAGNOSIS — I501 Left ventricular failure: Secondary | ICD-10-CM | POA: Diagnosis not present

## 2023-07-29 DIAGNOSIS — R269 Unspecified abnormalities of gait and mobility: Secondary | ICD-10-CM | POA: Diagnosis not present

## 2023-07-29 DIAGNOSIS — I110 Hypertensive heart disease with heart failure: Secondary | ICD-10-CM | POA: Diagnosis not present

## 2023-07-29 DIAGNOSIS — M1990 Unspecified osteoarthritis, unspecified site: Secondary | ICD-10-CM | POA: Diagnosis not present

## 2023-07-29 DIAGNOSIS — E209 Hypoparathyroidism, unspecified: Secondary | ICD-10-CM | POA: Diagnosis not present

## 2023-07-29 NOTE — Progress Notes (Signed)
Subjective: Chief Complaint  Patient presents with   Callouses    RM#11 Patient states needs foot care and is having discomfort from corns.   77 year old female presents the office with above concerns.  She states her nails are quite long and are causing discomfort she gets pain to her toes.  She also has calluses/corns.  No open lesions.  She is on Eliquis.   Objective: AAO x3, NAD DP/PT pulses palpable bilaterally, CRT less than 3 seconds Nails are hypertrophic, dystrophic, brittle, discolored, elongated 10. No surrounding redness or drainage. Tenderness nails 1-5 bilaterally. No open lesions or pre-ulcerative lesions are identified today. Hyperkeratotic lesion noted along the right 5th toe and minimally to the 2nd toe medially. No ulcerations.  There is deformity noted. No pain with calf compression, swelling, warmth, erythema  Assessment: Symptomatic onychomycosis; pre-ulcerative callus  Plan: -All treatment options discussed with the patient including all alternatives, risks, complications.  -Sharply debrided nails x 10 without any complications or bleeding.  The toes are quite sensitive to trim Bactrim into her comfort. -Debrided hyperkeratotic lesion right foot today and complications of bleeding there is no significant callus formation on the second toe debrided today.  Dispensed offloading. -Today foot inspection -Patient encouraged to call the office with any questions, concerns, change in symptoms.   Return in about 3 months (around 10/27/2023) for routine care.  Vivi Barrack DPM

## 2023-07-30 DIAGNOSIS — N179 Acute kidney failure, unspecified: Secondary | ICD-10-CM | POA: Diagnosis not present

## 2023-07-30 DIAGNOSIS — I130 Hypertensive heart and chronic kidney disease with heart failure and stage 1 through stage 4 chronic kidney disease, or unspecified chronic kidney disease: Secondary | ICD-10-CM | POA: Diagnosis not present

## 2023-07-30 DIAGNOSIS — M0579 Rheumatoid arthritis with rheumatoid factor of multiple sites without organ or systems involvement: Secondary | ICD-10-CM | POA: Diagnosis not present

## 2023-07-30 DIAGNOSIS — I083 Combined rheumatic disorders of mitral, aortic and tricuspid valves: Secondary | ICD-10-CM | POA: Diagnosis not present

## 2023-07-30 DIAGNOSIS — I5023 Acute on chronic systolic (congestive) heart failure: Secondary | ICD-10-CM | POA: Diagnosis not present

## 2023-07-30 DIAGNOSIS — I4892 Unspecified atrial flutter: Secondary | ICD-10-CM | POA: Diagnosis not present

## 2023-07-30 DIAGNOSIS — D631 Anemia in chronic kidney disease: Secondary | ICD-10-CM | POA: Diagnosis not present

## 2023-07-30 DIAGNOSIS — N184 Chronic kidney disease, stage 4 (severe): Secondary | ICD-10-CM | POA: Diagnosis not present

## 2023-07-30 DIAGNOSIS — I272 Pulmonary hypertension, unspecified: Secondary | ICD-10-CM | POA: Diagnosis not present

## 2023-08-08 ENCOUNTER — Other Ambulatory Visit (HOSPITAL_COMMUNITY): Payer: Self-pay | Admitting: Cardiology

## 2023-08-09 DIAGNOSIS — D631 Anemia in chronic kidney disease: Secondary | ICD-10-CM | POA: Diagnosis not present

## 2023-08-09 DIAGNOSIS — N184 Chronic kidney disease, stage 4 (severe): Secondary | ICD-10-CM | POA: Diagnosis not present

## 2023-08-09 DIAGNOSIS — I5023 Acute on chronic systolic (congestive) heart failure: Secondary | ICD-10-CM | POA: Diagnosis not present

## 2023-08-09 DIAGNOSIS — I4892 Unspecified atrial flutter: Secondary | ICD-10-CM | POA: Diagnosis not present

## 2023-08-09 DIAGNOSIS — I083 Combined rheumatic disorders of mitral, aortic and tricuspid valves: Secondary | ICD-10-CM | POA: Diagnosis not present

## 2023-08-09 DIAGNOSIS — I272 Pulmonary hypertension, unspecified: Secondary | ICD-10-CM | POA: Diagnosis not present

## 2023-08-09 DIAGNOSIS — I130 Hypertensive heart and chronic kidney disease with heart failure and stage 1 through stage 4 chronic kidney disease, or unspecified chronic kidney disease: Secondary | ICD-10-CM | POA: Diagnosis not present

## 2023-08-09 DIAGNOSIS — N179 Acute kidney failure, unspecified: Secondary | ICD-10-CM | POA: Diagnosis not present

## 2023-08-09 DIAGNOSIS — M0579 Rheumatoid arthritis with rheumatoid factor of multiple sites without organ or systems involvement: Secondary | ICD-10-CM | POA: Diagnosis not present

## 2023-08-10 ENCOUNTER — Encounter: Payer: Self-pay | Admitting: Nurse Practitioner

## 2023-08-10 NOTE — Assessment & Plan Note (Signed)
Will treat with diflucan

## 2023-08-10 NOTE — Assessment & Plan Note (Signed)
 TCM Performed. A member of the clinical team spoke with the patient upon dischare. Discharge summary was reviewed in full detail during the visit. Meds reconciled and compared to discharge meds. Medication list is updated and reviewed with the patient.  Greater than 50% face to face time was spent in counseling an coordination of care.  All questions were answered to the satisfaction of the patient.   Continue with PT and Home Health nurse.  Continue f/u with cardiology

## 2023-08-12 ENCOUNTER — Telehealth (HOSPITAL_COMMUNITY): Payer: Self-pay

## 2023-08-12 NOTE — Telephone Encounter (Signed)
 Pt reached out to me this morning to cancel our home visit appointment  She reports she will not be home b/c she has to go out of town today.   She does have clinic appoint next week, so will just meet her there then.   Izetta Quivers, EMT-Paramedic  [phone] 08/12/2023

## 2023-08-16 ENCOUNTER — Encounter (HOSPITAL_COMMUNITY): Payer: Medicare PPO

## 2023-08-17 ENCOUNTER — Encounter (HOSPITAL_COMMUNITY)
Admission: RE | Admit: 2023-08-17 | Discharge: 2023-08-17 | Disposition: A | Discharge status: Home / Self Care | Payer: Medicare PPO | Source: Ambulatory Visit | Attending: Nephrology | Admitting: Nephrology

## 2023-08-17 VITALS — BP 93/73 | HR 64 | Temp 96.1°F | Resp 18

## 2023-08-17 DIAGNOSIS — I4892 Unspecified atrial flutter: Secondary | ICD-10-CM | POA: Diagnosis not present

## 2023-08-17 DIAGNOSIS — D631 Anemia in chronic kidney disease: Secondary | ICD-10-CM | POA: Diagnosis not present

## 2023-08-17 DIAGNOSIS — N179 Acute kidney failure, unspecified: Secondary | ICD-10-CM | POA: Diagnosis not present

## 2023-08-17 DIAGNOSIS — M0579 Rheumatoid arthritis with rheumatoid factor of multiple sites without organ or systems involvement: Secondary | ICD-10-CM | POA: Diagnosis not present

## 2023-08-17 DIAGNOSIS — I272 Pulmonary hypertension, unspecified: Secondary | ICD-10-CM | POA: Diagnosis not present

## 2023-08-17 DIAGNOSIS — I083 Combined rheumatic disorders of mitral, aortic and tricuspid valves: Secondary | ICD-10-CM | POA: Diagnosis not present

## 2023-08-17 DIAGNOSIS — N183 Chronic kidney disease, stage 3 unspecified: Secondary | ICD-10-CM | POA: Diagnosis not present

## 2023-08-17 DIAGNOSIS — I5023 Acute on chronic systolic (congestive) heart failure: Secondary | ICD-10-CM | POA: Diagnosis not present

## 2023-08-17 DIAGNOSIS — I130 Hypertensive heart and chronic kidney disease with heart failure and stage 1 through stage 4 chronic kidney disease, or unspecified chronic kidney disease: Secondary | ICD-10-CM | POA: Diagnosis not present

## 2023-08-17 DIAGNOSIS — N184 Chronic kidney disease, stage 4 (severe): Secondary | ICD-10-CM | POA: Diagnosis not present

## 2023-08-17 LAB — IRON AND TIBC
Iron: 103 ug/dL (ref 28–170)
Saturation Ratios: 33 % — ABNORMAL HIGH (ref 10.4–31.8)
TIBC: 316 ug/dL (ref 250–450)
UIBC: 213 ug/dL

## 2023-08-17 LAB — FERRITIN: Ferritin: 1610 ng/mL — ABNORMAL HIGH (ref 11–307)

## 2023-08-17 MED ORDER — EPOETIN ALFA-EPBX 10000 UNIT/ML IJ SOLN
10000.0000 [IU] | INTRAMUSCULAR | Status: DC
Start: 2023-08-17 — End: 2024-09-12

## 2023-08-17 MED ORDER — EPOETIN ALFA-EPBX 10000 UNIT/ML IJ SOLN
INTRAMUSCULAR | Status: AC
  Filled 2023-08-17: qty 1

## 2023-08-18 ENCOUNTER — Telehealth: Payer: Self-pay | Admitting: *Deleted

## 2023-08-18 DIAGNOSIS — I272 Pulmonary hypertension, unspecified: Secondary | ICD-10-CM | POA: Diagnosis not present

## 2023-08-18 DIAGNOSIS — N184 Chronic kidney disease, stage 4 (severe): Secondary | ICD-10-CM | POA: Diagnosis not present

## 2023-08-18 DIAGNOSIS — N179 Acute kidney failure, unspecified: Secondary | ICD-10-CM | POA: Diagnosis not present

## 2023-08-18 DIAGNOSIS — D631 Anemia in chronic kidney disease: Secondary | ICD-10-CM | POA: Diagnosis not present

## 2023-08-18 DIAGNOSIS — I4892 Unspecified atrial flutter: Secondary | ICD-10-CM | POA: Diagnosis not present

## 2023-08-18 DIAGNOSIS — I083 Combined rheumatic disorders of mitral, aortic and tricuspid valves: Secondary | ICD-10-CM | POA: Diagnosis not present

## 2023-08-18 DIAGNOSIS — M0579 Rheumatoid arthritis with rheumatoid factor of multiple sites without organ or systems involvement: Secondary | ICD-10-CM | POA: Diagnosis not present

## 2023-08-18 DIAGNOSIS — I5023 Acute on chronic systolic (congestive) heart failure: Secondary | ICD-10-CM | POA: Diagnosis not present

## 2023-08-18 DIAGNOSIS — I130 Hypertensive heart and chronic kidney disease with heart failure and stage 1 through stage 4 chronic kidney disease, or unspecified chronic kidney disease: Secondary | ICD-10-CM | POA: Diagnosis not present

## 2023-08-18 LAB — POCT HEMOGLOBIN-HEMACUE: Hemoglobin: 13 g/dL (ref 12.0–15.0)

## 2023-08-18 NOTE — Progress Notes (Addendum)
 Advanced Heart Failure Clinic Note   PCP: Georgina Speaks, [name] Nephrology: Dr. Jerrye HF Cardiologist: Dr. Rolan  Chief Complaint: CHF Follow Up  HPI: 78 y.o. with history of rheumatoid arthritis, CKD stage IV, and nonischemic cardiomyopathy was referred by Dr. Judyth in [place] to establish heart failure care in [place].  Patient has been known to have a cardiomyopathy since 2015.  Cardiac MRI in 2015 showed EF 37% with no LGE.  Coronary angiography at that time showed no significant coronary disease.  Over the next few years, LV EF fluctuated up and down.  In 3/21, echo showed EF down to 20-25%.  She was admitted to the hospital in [place], [place] in 11/21 with CHF exacerbation.  Echo showed EF 20-25%, global hypokinesis, mildly decreased RV systolic function.   She was admitted in 3/22 with atrial fibrillation, converted to NSR on amiodarone . She went into atrial flutter after this and had TEE-DCCV in 4/22.  TEE showed EF < 20%, moderate LV dilation, mild RV dilation with moderate RV dysfunction, moderate central MR.  CPX in 3/22 showed severe HF limitation.  PYP scan 3/23 and likely negative.   She was evaluated for [facility] or ANTHEM, BNP too high and thought to be too frail.   Cardiac MRI in 10/22 showed mild LV dilation with EF 23%, normal RV size with RVEF 37%, ECV 34%, small area of subendocardial LGE in the mid inferolateral wall and small area of full thickness LGE in the basal  inferior wall.   Admitted 07/08/22 after mechanical fall resulting in left hip fracture. S/P left intramedullary nail 07/10/22.  Post op developed AKI. Echo showed EF improved to 45%.   Admitted in 1/24 with acute on chronic systolic CHF and AKI. She required milrinone , RHC showed CI 2.81 on milrinone  0.25.  Echo showed EF < 20%.  Suspected end stage HF.   Echo 3/24 EF 20-25%, mild LV dilation, severely decreased RV systolic function, mild RV enlargement, severe biatrial enlargement, moderate-severe  TR, moderate MR  She was admitted 3/13-3/24/24 w/ symptomatic anemia and dyspepsia. Hgb 6.8,  transfused 2 uRBCs. GI recommended conservative management. There was no overt blood loss-FOBT was negative-gastroenterology did not feel endoscopic evaluation was warranted. She was started on Protonix . Hospital course was c/b by volume overload.   Acute visit 05/27/23, mildly volume overloaded and in atrial fibrillation with RVR. Amiodarone  increased to 200 mg bid x 1 week. She ultimately chemically converted to NSR and TEE/DCCV aborted.  She was admitted 07/09/23 with acute on chronic CHF and AKI on CKD. Diuresed with IV lasix . Scr bumped to 4.2, improved to 4.0 after diuretic held a couple of days. Palliative care consulted for GOC discussions d/t end-stage HF. She was not ready to consider hospice.  Today she returns for HF follow up with son. Arrived in wheelchair. Has been feeling okay but not today. She did not sleep well last night due to shoulder pain. She feels that her abdomen has gotten tight over night, feels volume up. SOB at rest, although mostly at baseline, states she tries not to move too much as it causes her to get short of breath. No CP or dizziness. Sleeping on 2 pillows at night.Taking all medications.   ReDs clip: 26%  Labs (12/24): K 3.8, creatinine 4.03  PMH: 1. GERD 2. Rheumatoid arthritis: Followed by rheumatology in [place].  - No known pulmonary disease from RA.  3. Osteoporosis. 4. Gout 5. CKD stage 4 6. H/o SVT 7. Chronic systolic CHF:  She was found to have nonischemic cardiomyopathy with low EF in 2015.  - LHC (2015): Normal coronaries.  - cardiac MRI (2015): EF 37%, no LGE - Echo (3/21): EF 20-25%. - Echo (11/21): EF 20-25%, moderate LV dilation, global hypokinesis, biatrial enlargement, mildly decreased RV systolic function.  - Echo (12/21): EF < 20%, moderate LV dilation, normal RV size/systolic function, moderate MR, IVC normal.  - RHC (1/22): Mean RA 1,  PA 39/14, mean PCWP 10, PVR 3.9 WU, CI 2.22.  - CPX (3/22): peak VO2 10.1, VE/VCO2 55, RER 1.13.  Severe functional limitation due to HF.  - TEE (4/22): EF < 20%, moderate LV dilation, mild RV dilation with moderate RV dysfunction, moderate central MR. - PYP scan (4/22): grade 1 with H/CL 1.48. Equivocal. [employer] gene testing negative. - Cardiac MRI (10/22): mild LV dilation with EF 23%, normal RV size with RVEF 37%, ECV 34%, small area of subendocardial LGE in the mid inferolateral wall and small area of full thickness LGE in the basal inferior wall.  - PYP scan (3/23): grade 1, H/CL 1.3.  Probably negative.  - Echo 07/11/22: EF 45-50% RV normal  - Echo (3/24): EF 20-25%, mild LV dilation, severely decreased RV systolic function, mild RV enlargement, severe biatrial enlargement, moderate-severe TR, moderate MR, IVC normal.  8. Atrial fibrillation/flutter: TEE-DCCV in 4/22.  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  10. Varicella zoster with post-herpetic neuralgia 11. L hip fracture , mechanical fall.  12. Anemia  SH: From [place] but moved to [place] to live with daughter in 2021.  Has 3 children.  Nonsmoker.  No ETOH.   FH: Mother with CHF, father with MI.   ROS: All systems reviewed and negative except as per HPI.   Current Outpatient Medications  Medication Sig Dispense Refill   acetaminophen  (TYLENOL ) 500 MG tablet Take 500 mg by mouth every 6 (six) hours as needed for moderate pain (pain score 4-6).     amiodarone  (PACERONE ) 200 MG tablet Take 1 tablet (200 mg total) by mouth daily. Take 200 mg by mouth once daily     calcitRIOL  (ROCALTROL ) 0.25 MCG capsule Take 2 capsules by mouth daily.     ELIQUIS  2.5 MG TABS tablet TAKE 1 TABLET BY MOUTH TWICE A DAY 60 tablet 5   Ensure (ENSURE) Take 237 mLs by mouth every other day.     folic acid  (FOLVITE ) 1 MG tablet TAKE 1 TABLET BY MOUTH EVERY DAY 30 tablet 11   hydroxypropyl methylcellulose / hypromellose (ISOPTO  TEARS / GONIOVISC) 2.5 % ophthalmic solution Place 1 drop into both eyes as needed for dry eyes.     Multiple Vitamin (MULTIVITAMIN) tablet Take 1 tablet by mouth 2 (two) times a week.     omeprazole  (PRILOSEC) 40 MG capsule TAKE 1 CAPSULE (40 MG TOTAL) BY MOUTH DAILY AS NEEDED (FOR ACID REFLUX). 90 capsule 1   potassium chloride  SA (KLOR-CON  M) 10 MEQ tablet Take 1 tablet (10 mEq total) by mouth daily. 30 tablet 11   predniSONE  (DELTASONE ) 5 MG tablet Take 1 tablet (5 mg total) by mouth daily with breakfast. 90 tablet 1   sodium bicarbonate  650 MG tablet Take 650 mg by mouth 2 (two) times daily.     torsemide  (DEMADEX ) 20 MG tablet Take 1 tablet (20 mg total) by mouth every evening. 1 tablet 0   torsemide  40 MG TABS Take 40 mg by mouth every morning. 90 tablet 1   No current facility-administered  medications for this encounter.   Wt Readings from Last 3 Encounters:  08/19/23 47 kg (103 lb 9.6 oz)  07/26/23 42.2 kg (93 lb)  07/22/23 46.6 kg (102 lb 12.8 oz)   BP 92/62   Pulse 76   Wt 47 kg (103 lb 9.6 oz)   SpO2 100%   BMI 18.35 kg/m   PHYSICAL EXAM: General: Frail appearing. No distress on RA. Arrived in [facility]. HEENT: neck supple.   Cardiac: JVP ~7cm. S1 and S2 present. No murmurs or rub. Resp: Lung sounds clear and equal B/L Abdomen: Soft, non-tender, mildly distended. + BS. Extremities: Warm and dry. No rash, cyanosis. Trace edema in ankles and feet.  Neuro: Alert and oriented x3. Affect pleasant. Generalized weakness.  Assessment/Plan: 1. Chronic Systolic CHF: Nonischemic cardiomyopathy.  This has been known since 2015, cath in 2015 showed no coronary disease and cardiac MRI in 2015 showed no LGE.  Cause is uncertain, familial cardiomyopathy is a concern given nonischemic cardiomyopathy in her mother.  Cannot rule out remote viral myocarditis. RHC (1/22) showed low filling pressures and actually a relatively preserved cardiac index of 2.22.  PYP scan in 3/23 likely negative.   Cardiac MRI in 10/22 showed mild LV dilation with EF 23%, normal RV size with RVEF 37%, ECV 34%, small area of subendocardial LGE in the mid inferolateral wall and small area of full thickness LGE in the basal inferior wall.   This was not suggestive of cardiac amyloidosis but possibly could be consistent with coronary embolism (though no LV thrombus visualized) versus prior myocarditis versus sarcoidosis.  [employer] gene testing for TTR mutations was negative. Cardiac MRI not consistent with amyloidosis.  She has MGUS with monoclonal IgG paraprotein, but based on cMRI and slow progression, think AL amyloidosis is unlikely.  Most recent echo 3/24 with EF 20-25%, mild LV dilation, severely decreased RV systolic function, mild RV enlargement, severe biatrial enlargement, moderate-severe TR, moderate MR. She was admitted in 1/24 and required milrinone .  - Chronically NYHA IV. Volume slightly up on exam, ReDS 26%.  - Increase Torsemide  to 40/40 x3days then return to Torsemide  40 mg q am + 20 mg q pm. - Off all GDMT with hypotension and CKD - BMET/BNP today - Does not have an ICD, would not place with end stage HF.   2. CKD: Stage 4.  She is followed by Dr. Jerrye.  - Most recent SCr averaging mid 3s, 4.0 at hospital discharge. Labs today. 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 3. Rheumatoid arthritis: No history of lung involvement. She has been on a low dose of prednisone  chronically. Now off Imuran , felt to be contributing to anemia  - She follows with Dr. Jeannetta  4. Atrial fibrillation/flutter: S/p TEE-DCCV in 4/22. She is now on amiodarone .  She was in atypical atrial flutter 10/24. She does not tolerate atrial arrhythmias. Regular rhythm on exam today. - Continue amiodarone  200 mg daily.  Recent LFTs and TSH okay. - Continue Eliquis  5 mg bid. No bleeding issues.  5. Anemia: She is receiving iron infusions per nephrology. 6. GOC: Seen by Palliative Care during recent admit and referred for outpatient Palliative.  Not ready for hospice. Remains Full Code.  Follow up in 3 week with Dr. McLean  Tracy Pickar, NP 08/19/2023

## 2023-08-18 NOTE — Progress Notes (Signed)
 Complex Care Management Care Guide Note  08/18/2023 Name: KAHMYA PINKHAM MRN: [id number] DOB: 1946/03/23  MARG MACMASTER is a 78 y.o. year old female who is a primary care patient of Georgina Speaks, [name] and is actively engaged with the care management team. I reached out to Nena LOISE Postin by phone today to assist with re-scheduling  with the RN Case Manager.  Follow up plan: Unsuccessful telephone outreach attempt made. A HIPAA compliant phone message was left for the patient providing contact information and requesting a return call.  [facility]  Care Coordination Care Guide  Direct Dial: [phone]

## 2023-08-19 ENCOUNTER — Ambulatory Visit (HOSPITAL_COMMUNITY)
Admission: RE | Admit: 2023-08-19 | Discharge: 2023-08-19 | Disposition: A | Discharge status: Home / Self Care | Payer: Medicare PPO | Source: Ambulatory Visit | Attending: Cardiology | Admitting: Cardiology

## 2023-08-19 ENCOUNTER — Encounter (HOSPITAL_COMMUNITY): Payer: Self-pay

## 2023-08-19 VITALS — BP 92/62 | HR 76 | Wt 103.6 lb

## 2023-08-19 DIAGNOSIS — D631 Anemia in chronic kidney disease: Secondary | ICD-10-CM

## 2023-08-19 DIAGNOSIS — I4892 Unspecified atrial flutter: Secondary | ICD-10-CM | POA: Insufficient documentation

## 2023-08-19 DIAGNOSIS — I428 Other cardiomyopathies: Secondary | ICD-10-CM | POA: Diagnosis not present

## 2023-08-19 DIAGNOSIS — I480 Paroxysmal atrial fibrillation: Secondary | ICD-10-CM

## 2023-08-19 DIAGNOSIS — Z7901 Long term (current) use of anticoagulants: Secondary | ICD-10-CM | POA: Diagnosis not present

## 2023-08-19 DIAGNOSIS — I484 Atypical atrial flutter: Secondary | ICD-10-CM | POA: Insufficient documentation

## 2023-08-19 DIAGNOSIS — D472 Monoclonal gammopathy: Secondary | ICD-10-CM | POA: Insufficient documentation

## 2023-08-19 DIAGNOSIS — I5022 Chronic systolic (congestive) heart failure: Secondary | ICD-10-CM | POA: Diagnosis not present

## 2023-08-19 DIAGNOSIS — I4891 Unspecified atrial fibrillation: Secondary | ICD-10-CM | POA: Insufficient documentation

## 2023-08-19 DIAGNOSIS — D649 Anemia, unspecified: Secondary | ICD-10-CM | POA: Insufficient documentation

## 2023-08-19 DIAGNOSIS — N184 Chronic kidney disease, stage 4 (severe): Secondary | ICD-10-CM | POA: Diagnosis not present

## 2023-08-19 DIAGNOSIS — Z79899 Other long term (current) drug therapy: Secondary | ICD-10-CM | POA: Diagnosis not present

## 2023-08-19 DIAGNOSIS — M069 Rheumatoid arthritis, unspecified: Secondary | ICD-10-CM | POA: Diagnosis not present

## 2023-08-19 DIAGNOSIS — Z7952 Long term (current) use of systemic steroids: Secondary | ICD-10-CM | POA: Diagnosis not present

## 2023-08-19 DIAGNOSIS — I959 Hypotension, unspecified: Secondary | ICD-10-CM | POA: Diagnosis not present

## 2023-08-19 DIAGNOSIS — Z8249 Family history of ischemic heart disease and other diseases of the circulatory system: Secondary | ICD-10-CM | POA: Insufficient documentation

## 2023-08-19 LAB — BASIC METABOLIC PANEL
Anion gap: 12 (ref 5–15)
BUN: 105 mg/dL — ABNORMAL HIGH (ref 8–23)
CO2: 17 mmol/L — ABNORMAL LOW (ref 22–32)
Calcium: 9.2 mg/dL (ref 8.9–10.3)
Chloride: 106 mmol/L (ref 98–111)
Creatinine, Ser: 4.29 mg/dL — ABNORMAL HIGH (ref 0.44–1.00)
GFR, Estimated: 10 mL/min — ABNORMAL LOW (ref >60)
Glucose, Bld: 98 mg/dL (ref 70–99)
Potassium: 4.1 mmol/L (ref 3.5–5.1)
Sodium: 135 mmol/L (ref 135–145)

## 2023-08-19 LAB — BRAIN NATRIURETIC PEPTIDE: B Natriuretic Peptide: 2990.2 pg/mL — ABNORMAL HIGH (ref 0.0–100.0)

## 2023-08-19 NOTE — Patient Instructions (Signed)
 Medication Changes:  INCREASE TORSEMIDE  TO 40MG  TWICE DAILY FOR THE NEXT 3 DAYS   THEN GO BACK TO 40MG  IN THE MORNING AND 20MG  IN THE EVENING   Lab Work:  Labs done today, your results will be available in MyChart, we will contact you for abnormal readings  Follow-Up in: AS SCHEDULED   At the Advanced Heart Failure Clinic, you and your health needs are our priority. We have a designated team specialized in the treatment of Heart Failure. This Care Team includes your primary Heart Failure Specialized Cardiologist (physician), Advanced Practice Providers (APPs- Physician Assistants and Nurse Practitioners), and Pharmacist who all work together to provide you with the care you need, when you need it.   You may see any of the following providers on your designated Care Team at your next follow up:  Dr. Toribio Fuel Dr. Ezra Shuck Dr. Ria Commander Dr. Odis Brownie Greig Mosses, NP Caffie Shed, [place] [facility] [place], [place] Beckey Coe, NP Jordan Lee, NP Tinnie Redman, PharmD   Please be sure to bring in all your medications bottles to every appointment.   Need to Contact Us :  If you have any questions or concerns before your next appointment please send us  a message through [place] or call our office at [phone].    TO LEAVE A MESSAGE FOR THE NURSE SELECT OPTION 2, PLEASE LEAVE A MESSAGE INCLUDING: YOUR NAME DATE OF BIRTH CALL BACK NUMBER REASON FOR CALL**this is important as we prioritize the call backs  YOU WILL RECEIVE A CALL BACK THE SAME DAY AS LONG AS YOU CALL BEFORE 4:00 PM

## 2023-08-19 NOTE — Progress Notes (Signed)
 ReDS Vest / Clip - 08/19/23 1200       ReDS Vest / Clip   Station Marker A    Ruler Value 24    ReDS Value Range Low volume    ReDS Actual Value 26

## 2023-08-20 DIAGNOSIS — N179 Acute kidney failure, unspecified: Secondary | ICD-10-CM | POA: Diagnosis not present

## 2023-08-20 DIAGNOSIS — I083 Combined rheumatic disorders of mitral, aortic and tricuspid valves: Secondary | ICD-10-CM | POA: Diagnosis not present

## 2023-08-20 DIAGNOSIS — I130 Hypertensive heart and chronic kidney disease with heart failure and stage 1 through stage 4 chronic kidney disease, or unspecified chronic kidney disease: Secondary | ICD-10-CM | POA: Diagnosis not present

## 2023-08-20 DIAGNOSIS — I272 Pulmonary hypertension, unspecified: Secondary | ICD-10-CM | POA: Diagnosis not present

## 2023-08-20 DIAGNOSIS — I5023 Acute on chronic systolic (congestive) heart failure: Secondary | ICD-10-CM | POA: Diagnosis not present

## 2023-08-20 DIAGNOSIS — I4892 Unspecified atrial flutter: Secondary | ICD-10-CM | POA: Diagnosis not present

## 2023-08-20 DIAGNOSIS — M0579 Rheumatoid arthritis with rheumatoid factor of multiple sites without organ or systems involvement: Secondary | ICD-10-CM | POA: Diagnosis not present

## 2023-08-20 DIAGNOSIS — N184 Chronic kidney disease, stage 4 (severe): Secondary | ICD-10-CM | POA: Diagnosis not present

## 2023-08-20 DIAGNOSIS — D631 Anemia in chronic kidney disease: Secondary | ICD-10-CM | POA: Diagnosis not present

## 2023-08-22 DIAGNOSIS — R278 Other lack of coordination: Secondary | ICD-10-CM | POA: Diagnosis not present

## 2023-08-22 DIAGNOSIS — I5022 Chronic systolic (congestive) heart failure: Secondary | ICD-10-CM | POA: Diagnosis not present

## 2023-08-22 DIAGNOSIS — N184 Chronic kidney disease, stage 4 (severe): Secondary | ICD-10-CM | POA: Diagnosis not present

## 2023-08-22 DIAGNOSIS — S72002D Fracture of unspecified part of neck of left femur, subsequent encounter for closed fracture with routine healing: Secondary | ICD-10-CM | POA: Diagnosis not present

## 2023-08-22 DIAGNOSIS — M6281 Muscle weakness (generalized): Secondary | ICD-10-CM | POA: Diagnosis not present

## 2023-08-23 DIAGNOSIS — I130 Hypertensive heart and chronic kidney disease with heart failure and stage 1 through stage 4 chronic kidney disease, or unspecified chronic kidney disease: Secondary | ICD-10-CM | POA: Diagnosis not present

## 2023-08-23 DIAGNOSIS — N179 Acute kidney failure, unspecified: Secondary | ICD-10-CM | POA: Diagnosis not present

## 2023-08-23 DIAGNOSIS — I272 Pulmonary hypertension, unspecified: Secondary | ICD-10-CM | POA: Diagnosis not present

## 2023-08-23 DIAGNOSIS — I083 Combined rheumatic disorders of mitral, aortic and tricuspid valves: Secondary | ICD-10-CM | POA: Diagnosis not present

## 2023-08-23 DIAGNOSIS — N184 Chronic kidney disease, stage 4 (severe): Secondary | ICD-10-CM | POA: Diagnosis not present

## 2023-08-23 DIAGNOSIS — I5023 Acute on chronic systolic (congestive) heart failure: Secondary | ICD-10-CM | POA: Diagnosis not present

## 2023-08-23 DIAGNOSIS — M0579 Rheumatoid arthritis with rheumatoid factor of multiple sites without organ or systems involvement: Secondary | ICD-10-CM | POA: Diagnosis not present

## 2023-08-23 DIAGNOSIS — I4892 Unspecified atrial flutter: Secondary | ICD-10-CM | POA: Diagnosis not present

## 2023-08-23 DIAGNOSIS — D631 Anemia in chronic kidney disease: Secondary | ICD-10-CM | POA: Diagnosis not present

## 2023-08-24 DIAGNOSIS — M0579 Rheumatoid arthritis with rheumatoid factor of multiple sites without organ or systems involvement: Secondary | ICD-10-CM | POA: Diagnosis not present

## 2023-08-24 DIAGNOSIS — I4892 Unspecified atrial flutter: Secondary | ICD-10-CM | POA: Diagnosis not present

## 2023-08-24 DIAGNOSIS — I5023 Acute on chronic systolic (congestive) heart failure: Secondary | ICD-10-CM | POA: Diagnosis not present

## 2023-08-24 DIAGNOSIS — D631 Anemia in chronic kidney disease: Secondary | ICD-10-CM | POA: Diagnosis not present

## 2023-08-24 DIAGNOSIS — N179 Acute kidney failure, unspecified: Secondary | ICD-10-CM | POA: Diagnosis not present

## 2023-08-24 DIAGNOSIS — N184 Chronic kidney disease, stage 4 (severe): Secondary | ICD-10-CM | POA: Diagnosis not present

## 2023-08-24 DIAGNOSIS — I083 Combined rheumatic disorders of mitral, aortic and tricuspid valves: Secondary | ICD-10-CM | POA: Diagnosis not present

## 2023-08-24 DIAGNOSIS — I272 Pulmonary hypertension, unspecified: Secondary | ICD-10-CM | POA: Diagnosis not present

## 2023-08-24 DIAGNOSIS — I130 Hypertensive heart and chronic kidney disease with heart failure and stage 1 through stage 4 chronic kidney disease, or unspecified chronic kidney disease: Secondary | ICD-10-CM | POA: Diagnosis not present

## 2023-08-26 ENCOUNTER — Telehealth (HOSPITAL_COMMUNITY): Payer: Self-pay

## 2023-08-26 NOTE — Telephone Encounter (Signed)
I was running very behind schedule due to previous patient taking much longer than expected, she called and reported that she is going to have to resch b/c she has to go somewhere right now.   Will f/u next week.   Kerry Hough, EMT-Paramedic  [phone] 08/26/2023

## 2023-08-30 NOTE — Progress Notes (Signed)
Complex Care Management Care Guide Note  08/30/2023 Name: Tracy Nelson MRN: [id number] DOB: 1946-01-31  Tracy Nelson is a 78 y.o. year old female who is a primary care patient of Tracy Felts, Tracy Nelson and is actively engaged with the care management team. I reached out to Tracy Nelson by phone today to assist with re-scheduling  with the RN Case Manager.  Follow up plan: Telephone appointment with complex care management team member scheduled for:  09/08/23  Tracy Nelson  [facility]  [facility], [facility] Guide  Direct Dial: [phone]  Fax [phone]

## 2023-08-31 ENCOUNTER — Encounter (HOSPITAL_COMMUNITY)
Admission: RE | Admit: 2023-08-31 | Discharge: 2023-08-31 | Disposition: A | Discharge status: Home / Self Care | Payer: Medicare PPO | Source: Ambulatory Visit | Attending: Nephrology

## 2023-08-31 VITALS — BP 92/75 | HR 82 | Temp 97.3°F | Resp 17

## 2023-08-31 DIAGNOSIS — I5023 Acute on chronic systolic (congestive) heart failure: Secondary | ICD-10-CM | POA: Diagnosis not present

## 2023-08-31 DIAGNOSIS — N183 Chronic kidney disease, stage 3 unspecified: Secondary | ICD-10-CM | POA: Diagnosis not present

## 2023-08-31 DIAGNOSIS — I4892 Unspecified atrial flutter: Secondary | ICD-10-CM | POA: Diagnosis not present

## 2023-08-31 DIAGNOSIS — I130 Hypertensive heart and chronic kidney disease with heart failure and stage 1 through stage 4 chronic kidney disease, or unspecified chronic kidney disease: Secondary | ICD-10-CM | POA: Diagnosis not present

## 2023-08-31 DIAGNOSIS — M0579 Rheumatoid arthritis with rheumatoid factor of multiple sites without organ or systems involvement: Secondary | ICD-10-CM | POA: Diagnosis not present

## 2023-08-31 DIAGNOSIS — D631 Anemia in chronic kidney disease: Secondary | ICD-10-CM | POA: Diagnosis not present

## 2023-08-31 DIAGNOSIS — I272 Pulmonary hypertension, unspecified: Secondary | ICD-10-CM | POA: Diagnosis not present

## 2023-08-31 DIAGNOSIS — N179 Acute kidney failure, unspecified: Secondary | ICD-10-CM | POA: Diagnosis not present

## 2023-08-31 DIAGNOSIS — I083 Combined rheumatic disorders of mitral, aortic and tricuspid valves: Secondary | ICD-10-CM | POA: Diagnosis not present

## 2023-08-31 DIAGNOSIS — N184 Chronic kidney disease, stage 4 (severe): Secondary | ICD-10-CM | POA: Diagnosis not present

## 2023-08-31 LAB — POCT HEMOGLOBIN-HEMACUE: Hemoglobin: 12.3 g/dL (ref 12.0–15.0)

## 2023-08-31 MED ORDER — EPOETIN ALFA-EPBX 10000 UNIT/ML IJ SOLN
10000.0000 [IU] | INTRAMUSCULAR | Status: DC
Start: 2023-08-31 — End: 2024-09-26

## 2023-09-02 ENCOUNTER — Other Ambulatory Visit (HOSPITAL_COMMUNITY): Payer: Self-pay

## 2023-09-02 NOTE — Progress Notes (Signed)
Paramedicine Encounter    [name], female    DOB: 28-Aug-1945, 78 y.o.   MRN: [id number]   Complaints-bloated,not feeling well, increased sob   Edema-abd bloated   Compliance with meds-has not had meds today yet-advised her to go ahead and get the torsemide on board   Pill box filled-n/a If so, by whom-n/a  Refills needed-she handles     Pt reports she does not feel well today, she feels bloated, she looks bloated. Her home weights are 93-95.  She reports increased sob past 2 days upon exertion or at rest.  She took extra 20mg  torsemide yesterday on her own. She did not see any increased urine output more than normal.  She reports her urine color is clear.  She denies dizziness.   She has not taken her meds yet today, I am here at 430.  She ate breakfast this morning but she went back to sleep. She doesn't sleep at night much.  She is limiting her fluid intake.  Reviewed her diet, she had grilled salmon from outback last night and plan on having ribs for dinner. So we talked about that and her watching her sodium intake.  Messaged clinic staff to let them know of symptoms, I will not be here tomor but if any changes are needed, she handles her own meds and is able to do that.    BP 92/60   Pulse 78   Resp 16   Wt 95 lb (43.1 kg)   SpO2 96%   BMI 16.83 kg/m  Weight yesterday-94 Last visit weight-103  Patient Care Team: [name], [name] as PCP - General (General Practice) Burna Sis, LCSW as Social Worker (Licensed Clinical Social Worker) Physiological scientist, Pershing Cox, RPH (Inactive) (Pharmacist) Laurey Morale, MD as Consulting Physician (Cardiology)  Patient Active Problem List   Diagnosis Date Noted   Vaginal irritation 07/28/2023   Acute clinical systolic heart failure (HCC) 07/10/2023   Acute on chronic systolic CHF (congestive heart failure) (HCC) 07/10/2023   CHF exacerbation (HCC) 07/09/2023   Elevated troponin 07/09/2023   PAF (paroxysmal  atrial fibrillation) (HCC) 06/08/2023   Influenza vaccination declined 06/08/2023   Herpes zoster vaccination declined 06/08/2023   COVID-19 vaccination declined 06/08/2023   Bumps on skin 02/03/2023   Acquired thrombophilia (HCC) 02/03/2023   Prediabetes 01/13/2023   Prolonged QT interval 10/30/2022   Cataract 09/14/2022   Uterine prolapse 09/14/2022   Pressure ulcer 09/03/2022   Acute heart failure with reduced ejection fraction (HFrEF, <= 40%) (HCC) 08/30/2022   Anemia of chronic disease 08/30/2022   Chronic systolic CHF (congestive heart failure) (HCC) 07/13/2022   CKD (chronic kidney disease), stage IV (HCC) 07/09/2022   Corneal melt, 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   Monoclonal gammopathy 03/10/2021   Atypical chest pain 10/22/2020   CKD (chronic kidney disease), stage III (HCC) 10/22/2020   Atrial flutter (HCC) 10/18/2020   Arthritis 07/15/2020   GERD (gastroesophageal reflux disease) 07/15/2020   Non-ischemic cardiomyopathy (HCC) 07/15/2020   HFrEF (heart failure with reduced ejection fraction) (HCC) 07/15/2020   HTN (hypertension) 07/15/2020   Osteoporosis 07/15/2020   Raynaud's disease 05/05/2019   Rheumatoid arthritis involving multiple sites with positive rheumatoid factor (HCC) 10/28/2015   Mitral regurgitation 05/30/2014   Vitamin D deficiency 05/15/2013   Encounter for long-term (current) use of other medications 12/14/2008   Sjogren's syndrome (HCC) 12/14/2008    Current Outpatient Medications:    acetaminophen (TYLENOL) 500 MG tablet, Take 500 mg by mouth every 6 (  six) hours as needed for moderate pain (pain score 4-6)., Disp: , Rfl:    amiodarone (PACERONE) 200 MG tablet, Take 1 tablet (200 mg total) by mouth daily. Take 200 mg by mouth once daily, Disp: , Rfl:    calcitRIOL (ROCALTROL) 0.25 MCG capsule, Take 2 capsules by mouth daily., Disp: , Rfl:    ELIQUIS 2.5 MG TABS tablet, TAKE 1 TABLET BY MOUTH TWICE A DAY, Disp: 60 tablet, Rfl: 5   Ensure (ENSURE), Take 237 mLs  by mouth every other day., Disp: , Rfl:    folic acid (FOLVITE) 1 MG tablet, TAKE 1 TABLET BY MOUTH EVERY DAY, Disp: 30 tablet, Rfl: 11   hydroxypropyl methylcellulose / hypromellose (ISOPTO TEARS / GONIOVISC) 2.5 % ophthalmic solution, Place 1 drop into both eyes as needed for dry eyes., Disp: , Rfl:    Multiple Vitamin (MULTIVITAMIN) tablet, Take 1 tablet by mouth 2 (two) times a week., Disp: , Rfl:    omeprazole (PRILOSEC) 40 MG capsule, TAKE 1 CAPSULE (40 MG TOTAL) BY MOUTH DAILY AS NEEDED (FOR ACID REFLUX)., Disp: 90 capsule, Rfl: 1   potassium chloride SA (KLOR-CON M) 10 MEQ tablet, Take 1 tablet (10 mEq total) by mouth daily., Disp: 30 tablet, Rfl: 11   predniSONE (DELTASONE) 5 MG tablet, Take 1 tablet (5 mg total) by mouth daily with breakfast., Disp: 90 tablet, Rfl: 1   sodium bicarbonate 650 MG tablet, Take 650 mg by mouth 2 (two) times daily., Disp: , Rfl:    torsemide (DEMADEX) 20 MG tablet, Take 1 tablet (20 mg total) by mouth every evening., Disp: 1 tablet, Rfl: 0   torsemide 40 MG TABS, Take 40 mg by mouth every morning., Disp: 90 tablet, Rfl: 1 Allergies  Allergen Reactions   Baclofen Nausea And Vomiting   Penicillins Rash      Social History   Socioeconomic History   Marital status: Widowed    Spouse name: Not on file   Number of children: Not on file   Years of education: 16   Highest education level: Bachelor's degree (e.g., BA, AB, BS)  Occupational History   Occupation: Retired  Tobacco Use   Smoking status: Never    Passive exposure: Past   Smokeless tobacco: Never  Vaping Use   Vaping status: Never Used  Substance and Sexual Activity   Alcohol use: Never   Drug use: Never   Sexual activity: Not Currently  Other Topics Concern   Not on file  Social History Narrative   Not on file   Social Drivers of Health   Financial Resource Strain: Low Risk  (06/03/2023)   Overall Financial Resource Strain (CARDIA)    Difficulty of Paying Living Expenses: Not  hard at all  Food Insecurity: No Food Insecurity (07/19/2023)   Hunger Vital Sign    Worried About Running Out of Food in the Last Year: Never true    Ran Out of Food in the Last Year: Never true  Transportation Needs: Unmet Transportation Needs (07/19/2023)   PRAPARE - Administrator, Civil Service (Medical): Yes    Lack of Transportation (Non-Medical): No  Physical Activity: Inactive (06/03/2023)   Exercise Vital Sign    Days of Exercise per Week: 0 days    Minutes of Exercise per Session: 0 min  Stress: No Stress Concern Present (06/03/2023)   [employer] of Occupational Health - Occupational Stress Questionnaire    Feeling of Stress : Only a little  Social Connections: Moderately Isolated (  06/03/2023)   Social Connection and Isolation Panel [NHANES]    Frequency of Communication with Friends and Family: More than three times a week    Frequency of Social Gatherings with Friends and Family: Never    Attends Religious Services: More than 4 times per year    Active Member of [employer] or Organizations: No    Attends Banker Meetings: Never    Marital Status: Widowed  Intimate Partner Violence: Not At Risk (07/19/2023)   Humiliation, Afraid, Rape, and Kick questionnaire    Fear of Current or Ex-Partner: No    Emotionally Abused: No    Physically Abused: No    Sexually Abused: No    Physical Exam      Future Appointments  Date Time Provider Department Center  09/07/2023 11:00 AM Laurey Morale, MD [facility] None  09/08/2023 12:00 PM Little, Karma Lew, RN [facility] None  09/14/2023 11:00 AM [facility] None  09/30/2023 10:40 AM Fuller Plan, MD [facility] None  11/03/2023  9:00 AM Helane Gunther, DPM [facility]  11/29/2023 11:00 AM [name], [name] [facility] None       Kerry Hough, Paramedic [phone] [facility] Paramedic  09/02/23

## 2023-09-03 ENCOUNTER — Telehealth (HOSPITAL_COMMUNITY): Payer: Self-pay | Admitting: Emergency Medicine

## 2023-09-03 ENCOUNTER — Telehealth (HOSPITAL_COMMUNITY): Payer: Self-pay | Admitting: Cardiology

## 2023-09-03 MED ORDER — POTASSIUM CHLORIDE CRYS ER 10 MEQ PO TBCR: 20.0000 meq | EXTENDED_RELEASE_TABLET | Freq: Every day | ORAL | 11 refills | Status: DC

## 2023-09-03 MED ORDER — TORSEMIDE 20 MG PO TABS: ORAL_TABLET | ORAL | 3 refills | Status: DC

## 2023-09-03 NOTE — Telephone Encounter (Signed)
Spoke with Ms. Byland to follow up on med changes from clinic.  She states that she got the message and understands the instructions and has no questions about same.    Beatrix Shipper, EMT-Paramedic [phone] 09/03/2023

## 2023-09-03 NOTE — Telephone Encounter (Signed)
LMOM FOR PATIENT AND DAUGHTER   WILL ROUTE TO PARA MED AS WELL TO ASSIST WITH MED CHANGE

## 2023-09-03 NOTE — Telephone Encounter (Signed)
-----   Message from [facility] Katie L sent at 09/02/2023  4:41 PM EST ----- Regarding: symptoms HF Paramedicine Message to [facility]  Pharmacy (if applicable):[facility]    Issue/reason for call: sob, bloated   Medication refill?   Blood pressure? 92/60  Weight gain? (How much over a week?)  weight between 93-95 Weight loss? (How much over a week?)  Edema ? (Baseline or worse?) abd bloated   Dyspnea?  (Baseline or worse?)  increased with both rest and exertion    Medications are taken as prescribed? Yes   She reports increased sob, she feels bloated, she looks bloated as well, no edema to lower extremities.  She took an extra 20mg  in the PM yesterday totally 40mg  for that dose. She reports not urinating as much as she normally does and no increased urination with the extra torsemide.  Her next f/u in clinic is next Tuesday.   Just a reminder I will be off Friday but she handles her own meds and can understand med change instructions.    Kerry Hough, EMT-Paramedic  [phone] 09/02/2023

## 2023-09-03 NOTE — Telephone Encounter (Signed)
-   PT AWARE VIA DAUGHTER

## 2023-09-06 DIAGNOSIS — I083 Combined rheumatic disorders of mitral, aortic and tricuspid valves: Secondary | ICD-10-CM | POA: Diagnosis not present

## 2023-09-06 DIAGNOSIS — I130 Hypertensive heart and chronic kidney disease with heart failure and stage 1 through stage 4 chronic kidney disease, or unspecified chronic kidney disease: Secondary | ICD-10-CM | POA: Diagnosis not present

## 2023-09-06 DIAGNOSIS — I5023 Acute on chronic systolic (congestive) heart failure: Secondary | ICD-10-CM | POA: Diagnosis not present

## 2023-09-06 DIAGNOSIS — M0579 Rheumatoid arthritis with rheumatoid factor of multiple sites without organ or systems involvement: Secondary | ICD-10-CM | POA: Diagnosis not present

## 2023-09-06 DIAGNOSIS — N179 Acute kidney failure, unspecified: Secondary | ICD-10-CM | POA: Diagnosis not present

## 2023-09-06 DIAGNOSIS — I4892 Unspecified atrial flutter: Secondary | ICD-10-CM | POA: Diagnosis not present

## 2023-09-06 DIAGNOSIS — D631 Anemia in chronic kidney disease: Secondary | ICD-10-CM | POA: Diagnosis not present

## 2023-09-06 DIAGNOSIS — N184 Chronic kidney disease, stage 4 (severe): Secondary | ICD-10-CM | POA: Diagnosis not present

## 2023-09-06 DIAGNOSIS — I272 Pulmonary hypertension, unspecified: Secondary | ICD-10-CM | POA: Diagnosis not present

## 2023-09-07 ENCOUNTER — Ambulatory Visit (HOSPITAL_COMMUNITY)
Admission: RE | Admit: 2023-09-07 | Discharge: 2023-09-07 | Disposition: A | Discharge status: Home / Self Care | Payer: Medicare PPO | Source: Ambulatory Visit | Attending: Cardiology | Admitting: Cardiology

## 2023-09-07 ENCOUNTER — Other Ambulatory Visit (HOSPITAL_COMMUNITY): Payer: Self-pay

## 2023-09-07 ENCOUNTER — Encounter (HOSPITAL_COMMUNITY): Payer: Self-pay | Admitting: Cardiology

## 2023-09-07 VITALS — BP 90/60 | HR 84 | Wt 103.0 lb

## 2023-09-07 DIAGNOSIS — N184 Chronic kidney disease, stage 4 (severe): Secondary | ICD-10-CM | POA: Diagnosis not present

## 2023-09-07 DIAGNOSIS — Z79899 Other long term (current) drug therapy: Secondary | ICD-10-CM | POA: Insufficient documentation

## 2023-09-07 DIAGNOSIS — I5084 End stage heart failure: Secondary | ICD-10-CM | POA: Insufficient documentation

## 2023-09-07 DIAGNOSIS — D649 Anemia, unspecified: Secondary | ICD-10-CM | POA: Insufficient documentation

## 2023-09-07 DIAGNOSIS — I5022 Chronic systolic (congestive) heart failure: Secondary | ICD-10-CM | POA: Diagnosis not present

## 2023-09-07 DIAGNOSIS — I4891 Unspecified atrial fibrillation: Secondary | ICD-10-CM | POA: Diagnosis not present

## 2023-09-07 DIAGNOSIS — Z8249 Family history of ischemic heart disease and other diseases of the circulatory system: Secondary | ICD-10-CM | POA: Diagnosis not present

## 2023-09-07 DIAGNOSIS — M069 Rheumatoid arthritis, unspecified: Secondary | ICD-10-CM | POA: Insufficient documentation

## 2023-09-07 DIAGNOSIS — I428 Other cardiomyopathies: Secondary | ICD-10-CM | POA: Insufficient documentation

## 2023-09-07 DIAGNOSIS — R140 Abdominal distension (gaseous): Secondary | ICD-10-CM | POA: Diagnosis not present

## 2023-09-07 DIAGNOSIS — I4892 Unspecified atrial flutter: Secondary | ICD-10-CM | POA: Diagnosis not present

## 2023-09-07 DIAGNOSIS — D472 Monoclonal gammopathy: Secondary | ICD-10-CM | POA: Diagnosis not present

## 2023-09-07 DIAGNOSIS — Z7901 Long term (current) use of anticoagulants: Secondary | ICD-10-CM | POA: Insufficient documentation

## 2023-09-07 DIAGNOSIS — Z7952 Long term (current) use of systemic steroids: Secondary | ICD-10-CM | POA: Insufficient documentation

## 2023-09-07 DIAGNOSIS — R0602 Shortness of breath: Secondary | ICD-10-CM | POA: Diagnosis present

## 2023-09-07 LAB — COMPREHENSIVE METABOLIC PANEL
ALT: 29 U/L (ref 0–44)
AST: 21 U/L (ref 15–41)
Albumin: 3.1 g/dL — ABNORMAL LOW (ref 3.5–5.0)
Alkaline Phosphatase: 68 U/L (ref 38–126)
Anion gap: 10 (ref 5–15)
BUN: 92 mg/dL — ABNORMAL HIGH (ref 8–23)
CO2: 22 mmol/L (ref 22–32)
Calcium: 9.6 mg/dL (ref 8.9–10.3)
Chloride: 105 mmol/L (ref 98–111)
Creatinine, Ser: 4.05 mg/dL — ABNORMAL HIGH (ref 0.44–1.00)
GFR, Estimated: 11 mL/min — ABNORMAL LOW (ref >60)
Glucose, Bld: 100 mg/dL — ABNORMAL HIGH (ref 70–99)
Potassium: 4 mmol/L (ref 3.5–5.1)
Sodium: 137 mmol/L (ref 135–145)
Total Bilirubin: 0.7 mg/dL (ref 0.0–1.2)
Total Protein: 6.5 g/dL (ref 6.5–8.1)

## 2023-09-07 LAB — TSH: TSH: 2.496 u[IU]/mL (ref 0.350–4.500)

## 2023-09-07 LAB — BRAIN NATRIURETIC PEPTIDE: B Natriuretic Peptide: 3754 pg/mL — ABNORMAL HIGH (ref 0.0–100.0)

## 2023-09-07 MED ORDER — TORSEMIDE 20 MG PO TABS: ORAL_TABLET | ORAL | 3 refills | Status: DC

## 2023-09-07 NOTE — Patient Instructions (Signed)
CHANGE Torsemide to 60 mg in the morning and 40 mg in the evening.  Labs done today, your results will be available in MyChart, we will contact you for abnormal readings.  Your physician recommends that you schedule a follow-up appointment in: as scheduled.  If you have any questions or concerns before your next appointment please send [place] a message through [place] or call our office at [phone].    TO LEAVE A MESSAGE FOR THE NURSE SELECT OPTION 2, PLEASE LEAVE A MESSAGE INCLUDING: YOUR NAME DATE OF BIRTH CALL BACK NUMBER REASON FOR CALL**this is important as we prioritize the call backs  YOU WILL RECEIVE A CALL BACK THE SAME DAY AS LONG AS YOU CALL BEFORE 4:00 PM  At the [facility], you and your health needs are our priority. As part of our continuing mission to provide you with exceptional heart care, we have created designated Provider Care Teams. These Care Teams include your primary Cardiologist (physician) and Advanced Practice Providers (APPs- Physician Assistants and Nurse Practitioners) who all work together to provide you with the care you need, when you need it.   You may see any of the following providers on your designated Care Team at your next follow up: Dr Arvilla Meres Dr Marca Ancona Dr. Dorthula Nettles Dr. Clearnce Hasten Amy Filbert Schilder, NP Robbie Lis, [place] [facility] [place], [place] Brynda Peon, NP [place], NP Karle Plumber, PharmD   Please be sure to bring in all your medications bottles to every appointment.    Thank you for choosing [facility]

## 2023-09-07 NOTE — Progress Notes (Signed)
Paramedicine Encounter   Patient ID: LINDSEE LABARRE , female,   DOB: Jul 15, 1946,78 y.o.,  MRN: [id number]   Met patient in clinic today with provider.  Weight @ clinic-103 B/P-90/60  P-84 [id number]  Pt reports more sob. She did do the increase of the torsemide for the 3 days.-sat-sun-mon. Then her daily dose of torsemide is increased to 40mg  BID with of K daily.  She did notice decreased swelling to her legs, but not to her abd.  She did notice some increased urine output but not anymore. She does feel better than she did last week Her weight is the same as in clinic last time she was here.  She has not had her meds yet today.   Will see her next week.   Uses wheelchair when she goes out.  Sleeping on 2 pillows.   EKG done- NSR today  Med changes-keep 60mg  of torsemide in the AM and 40mg  in the afternoon   Labs done today   She is due to come back to clinic in 3-4 wks.    Kerry Hough, EMT-Paramedic [phone] 09/07/2023

## 2023-09-08 ENCOUNTER — Ambulatory Visit: Payer: Self-pay

## 2023-09-08 DIAGNOSIS — I272 Pulmonary hypertension, unspecified: Secondary | ICD-10-CM | POA: Diagnosis not present

## 2023-09-08 DIAGNOSIS — M0579 Rheumatoid arthritis with rheumatoid factor of multiple sites without organ or systems involvement: Secondary | ICD-10-CM | POA: Diagnosis not present

## 2023-09-08 DIAGNOSIS — N179 Acute kidney failure, unspecified: Secondary | ICD-10-CM | POA: Diagnosis not present

## 2023-09-08 DIAGNOSIS — N184 Chronic kidney disease, stage 4 (severe): Secondary | ICD-10-CM | POA: Diagnosis not present

## 2023-09-08 DIAGNOSIS — I130 Hypertensive heart and chronic kidney disease with heart failure and stage 1 through stage 4 chronic kidney disease, or unspecified chronic kidney disease: Secondary | ICD-10-CM | POA: Diagnosis not present

## 2023-09-08 DIAGNOSIS — D631 Anemia in chronic kidney disease: Secondary | ICD-10-CM | POA: Diagnosis not present

## 2023-09-08 DIAGNOSIS — I083 Combined rheumatic disorders of mitral, aortic and tricuspid valves: Secondary | ICD-10-CM | POA: Diagnosis not present

## 2023-09-08 DIAGNOSIS — I4892 Unspecified atrial flutter: Secondary | ICD-10-CM | POA: Diagnosis not present

## 2023-09-08 DIAGNOSIS — I5023 Acute on chronic systolic (congestive) heart failure: Secondary | ICD-10-CM | POA: Diagnosis not present

## 2023-09-08 NOTE — Progress Notes (Signed)
[facility] Note   PCP: [name], [name] Nephrology: [name] HF Cardiologist: [name]  Chief Complaint: Shortness of breath  HPI: 78 y.o. with history of rheumatoid arthritis, CKD stage IV, and nonischemic cardiomyopathy was referred by [name] in [place] to establish heart failure care in [place].  Patient has been known to have a cardiomyopathy since 2015.  Cardiac MRI in 2015 showed EF 37% with no LGE.  Coronary angiography at that time showed no significant coronary disease.  Over the next few years, LV EF fluctuated up and down.  In 3/21, echo showed EF down to 20-25%.  She was admitted to the hospital in [place], [place] in 11/21 with CHF exacerbation.  Echo showed EF 20-25%, global hypokinesis, mildly decreased RV systolic function.   She was admitted in 3/22 with atrial fibrillation, converted to NSR on amiodarone. She went into atrial flutter after this and had TEE-DCCV in 4/22.  TEE showed EF < 20%, moderate LV dilation, mild RV dilation with moderate RV dysfunction, moderate central MR.  CPX in 3/22 showed severe HF limitation.  PYP scan 3/23 and likely negative.   She was evaluated for [facility] or ANTHEM, BNP too high and thought to be too frail.   Cardiac MRI in 10/22 showed mild LV dilation with EF 23%, normal RV size with RVEF 37%, ECV 34%, small area of subendocardial LGE in the mid inferolateral wall and small area of full thickness LGE in the basal  inferior wall.   Admitted 07/08/22 after mechanical fall resulting in left hip fracture. S/P left intramedullary nail 07/10/22.  Post op developed AKI. Echo showed EF improved to 45%.   Admitted in 1/24 with acute on chronic systolic CHF and AKI. She required milrinone, RHC showed CI 2.81 on milrinone 0.25.  Echo showed EF < 20%.  Suspected end stage HF.   Echo 3/24 EF 20-25%, mild LV dilation, severely decreased RV systolic function, mild RV enlargement, severe biatrial enlargement,  moderate-severe TR, moderate MR  She was admitted 3/13-3/24/24 w/ symptomatic anemia and dyspepsia. Hgb 6.8,  transfused 2 uRBCs. GI recommended conservative management. There was no overt blood loss-FOBT was negative-gastroenterology did not feel endoscopic evaluation was warranted. She was started on Protonix. Hospital course was c/b by volume overload.   Acute visit 05/27/23, mildly volume overloaded and in atrial fibrillation with RVR. Amiodarone increased to 200 mg bid x 1 week. She ultimately chemically converted to NSR and TEE/DCCV aborted.  She was admitted 07/09/23 with acute on chronic CHF and AKI on CKD. Diuresed with IV lasix. Scr bumped to 4.2, improved to 4.0 after diuretic held a couple of days. Palliative care consulted for GOC discussions d/t end-stage HF. She was not ready to consider hospice.  Today she returns for HF follow up with son. Arrived in wheelchair. Recently felt like she was retaining fluid and took torsemide 60 mg bid x 3 days, now back to 40 mg bid.  She feels like the higher dose got her volume back to near baseline but still feels some abdominal bloating. She uses a wheelchair when out of the house, uses walker in house.  Still very limited, gets short of breath walking around her house.  Currently, no orthopnea/PND.  No lightheadedness. Follows with [name] for nephrology.  Weight stable.    Labs (12/24): K 3.8, creatinine 4.03 Labs (1/25): BNP 2990, K 4.1, creatinine 4.29, BUN 105  ECG (personallly reviewed): NSR, PVCs, nonspecific T wave changes.   PMH:  1. GERD 2. Rheumatoid arthritis: Followed by rheumatology in [place].  - No known pulmonary disease from RA.  3. Osteoporosis. 4. Gout 5. CKD stage 4 6. H/o SVT 7. Chronic systolic CHF: She was found to have nonischemic cardiomyopathy with low EF in 2015.  - LHC (2015): Normal coronaries.  - cardiac MRI (2015): EF 37%, no LGE - Echo (3/21): EF 20-25%. - Echo (11/21): EF 20-25%, moderate LV  dilation, global hypokinesis, biatrial enlargement, mildly decreased RV systolic function.  - Echo (12/21): EF < 20%, moderate LV dilation, normal RV size/systolic function, moderate MR, IVC normal.  - RHC (1/22): Mean RA 1, PA 39/14, mean PCWP 10, PVR 3.9 WU, CI 2.22.  - CPX (3/22): peak VO2 10.1, VE/VCO2 55, RER 1.13.  Severe functional limitation due to HF.  - TEE (4/22): EF < 20%, moderate LV dilation, mild RV dilation with moderate RV dysfunction, moderate central MR. - PYP scan (4/22): grade 1 with H/CL 1.48. Equivocal. [employer] gene testing negative. - Cardiac MRI (10/22): mild LV dilation with EF 23%, normal RV size with RVEF 37%, ECV 34%, small area of subendocardial LGE in the mid inferolateral wall and small area of full thickness LGE in the basal inferior wall.  - PYP scan (3/23): grade 1, H/CL 1.3.  Probably negative.  - Echo 07/11/22: EF 45-50% RV normal  - Echo (3/24): EF 20-25%, mild LV dilation, severely decreased RV systolic function, mild RV enlargement, severe biatrial enlargement, moderate-severe TR, moderate MR, IVC normal.  8. Atrial fibrillation/flutter: TEE-DCCV in 4/22.  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  10. Varicella zoster with post-herpetic neuralgia 11. L hip fracture , mechanical fall.  12. Anemia  SH: From [place] but moved to [place] to live with daughter in 2021.  Has 3 children.  Nonsmoker.  No ETOH.   FH: Mother with CHF, father with MI.   ROS: All systems reviewed and negative except as per HPI.   Current Outpatient Medications  Medication Sig Dispense Refill   acetaminophen (TYLENOL) 500 MG tablet Take 500 mg by mouth every 6 (six) hours as needed for moderate pain (pain score 4-6).     amiodarone (PACERONE) 200 MG tablet Take 1 tablet (200 mg total) by mouth daily. Take 200 mg by mouth once daily     calcitRIOL (ROCALTROL) 0.25 MCG capsule Take 2 capsules by mouth daily.     ELIQUIS 2.5 MG TABS tablet TAKE 1 TABLET BY  MOUTH TWICE A DAY 60 tablet 5   Ensure (ENSURE) Take 237 mLs by mouth every other day.     folic acid (FOLVITE) 1 MG tablet TAKE 1 TABLET BY MOUTH EVERY DAY 30 tablet 11   hydroxypropyl methylcellulose / hypromellose (ISOPTO TEARS / GONIOVISC) 2.5 % ophthalmic solution Place 1 drop into both eyes as needed for dry eyes.     Multiple Vitamin (MULTIVITAMIN) tablet Take 1 tablet by mouth 2 (two) times a week.     omeprazole (PRILOSEC) 40 MG capsule TAKE 1 CAPSULE (40 MG TOTAL) BY MOUTH DAILY AS NEEDED (FOR ACID REFLUX). 90 capsule 1   potassium chloride (KLOR-CON M) 10 MEQ tablet Take 2 tablets (20 mEq total) by mouth daily. 60 tablet 11   predniSONE (DELTASONE) 5 MG tablet Take 1 tablet (5 mg total) by mouth daily with breakfast. 90 tablet 1   sodium bicarbonate 650 MG tablet Take 650 mg by mouth 2 (two) times daily.     torsemide (DEMADEX) 20 MG tablet Take  3 tablets (60 mg total) by mouth in the morning AND 2 tablets (40 mg total) every evening. 200 tablet 3   No current facility-administered medications for this encounter.   Wt Readings from Last 3 Encounters:  09/07/23 46.7 kg (103 lb)  09/02/23 43.1 kg (95 lb)  08/19/23 47 kg (103 lb 9.6 oz)   BP 90/60   Pulse 84   Wt 46.7 kg (103 lb)   SpO2 98%   BMI 18.25 kg/m   PHYSICAL EXAM: General: NAD, frail Neck: JVP 8 cm, no thyromegaly or thyroid nodule.  Lungs: Clear to auscultation bilaterally with normal respiratory effort. CV: Nondisplaced PMI.  Heart regular S1/S2, no S3/S4, no murmur.  No peripheral edema.  No carotid bruit.  Normal pedal pulses.  Abdomen: Soft, nontender, no hepatosplenomegaly, mild distention.  Skin: Intact without lesions or rashes.  Neurologic: Alert and oriented x 3.  Psych: Normal affect. Extremities: No clubbing or cyanosis.  HEENT: Normal.   Assessment/Plan: 1. Chronic Systolic CHF: Nonischemic cardiomyopathy.  This has been known since 2015, cath in 2015 showed no coronary disease and cardiac MRI in  2015 showed no LGE.  Cause is uncertain, familial cardiomyopathy is a concern given nonischemic cardiomyopathy in her mother.  Cannot rule out remote viral myocarditis. RHC (1/22) showed low filling pressures and actually a relatively preserved cardiac index of 2.22.  PYP scan in 3/23 likely negative.  Cardiac MRI in 10/22 showed mild LV dilation with EF 23%, normal RV size with RVEF 37%, ECV 34%, small area of subendocardial LGE in the mid inferolateral wall and small area of full thickness LGE in the basal inferior wall.   This was not suggestive of cardiac amyloidosis but possibly could be consistent with coronary embolism (though no LV thrombus visualized) versus prior myocarditis versus sarcoidosis.  [employer] gene testing for TTR mutations was negative. Cardiac MRI not consistent with amyloidosis.  She has MGUS with monoclonal IgG paraprotein, but based on cMRI and slow progression, think AL amyloidosis is unlikely.  Most recent echo 3/24 with EF 20-25%, mild LV dilation, severely decreased RV systolic function, mild RV enlargement, severe biatrial enlargement, moderate-severe TR, moderate MR. She was admitted in 1/24 and required milrinone. Chronically NYHA class IV.  GDMT severely limited by low BP and CKD IV.  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 She is probably mildly volume overloaded on exam.  - Increase torsemide to 60 qam/40 qpm.  BMET/BNP today and in 10 days.  - Does not have an ICD, would not place with end stage HF.   2. CKD: Stage 4.  She is followed by [name]. 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 - BMET today.  3. Rheumatoid arthritis: No history of lung involvement. She has been on a low dose of prednisone chronically. Now off Imuran, felt to be contributing to anemia  - She follows with Dr. Dimple Casey  4. Atrial fibrillation/flutter: S/p TEE-DCCV in 4/22. She is now on amiodarone.  She was in atypical atrial flutter 10/24. She does not tolerate atrial arrhythmias. NSR today. -  Continue amiodarone 200 mg daily.  Check LFTs and TSH today. Will need regular eye exam.  - Continue Eliquis 5 mg bid.  5. Anemia: She is receiving iron infusions per nephrology. 6. GOC: Seen by Palliative Care during recent admit and referred for outpatient Palliative. Not ready for hospice. Remains Full Code. She is gradually declining.  Can readdress hospice when she is ready.   Follow up in 3 weeks with APP  Marca Ancona,  09/08/2023

## 2023-09-08 NOTE — Patient Outreach (Signed)
  Care Coordination   09/08/2023 Name: [place] MRN: [id number] DOB: [birth year]   Care Coordination Outreach Attempts:  An unsuccessful outreach was attempted for an appointment today.  Follow Up Plan:  Additional outreach attempts will be made to offer the patient complex care management information and services.   Encounter Outcome:  No Answer   Care Coordination Interventions:  No, not indicated    Delsa Sale RN BSN CCM [place]  [facility] Nurse Care Coordinator  Direct Dial: [phone] Website: [email or web address]

## 2023-09-09 DIAGNOSIS — M069 Rheumatoid arthritis, unspecified: Secondary | ICD-10-CM | POA: Diagnosis not present

## 2023-09-09 DIAGNOSIS — N185 Chronic kidney disease, stage 5: Secondary | ICD-10-CM | POA: Diagnosis not present

## 2023-09-09 DIAGNOSIS — N189 Chronic kidney disease, unspecified: Secondary | ICD-10-CM | POA: Diagnosis not present

## 2023-09-09 DIAGNOSIS — M1000 Idiopathic gout, unspecified site: Secondary | ICD-10-CM | POA: Diagnosis not present

## 2023-09-09 DIAGNOSIS — N2581 Secondary hyperparathyroidism of renal origin: Secondary | ICD-10-CM | POA: Diagnosis not present

## 2023-09-09 DIAGNOSIS — D631 Anemia in chronic kidney disease: Secondary | ICD-10-CM | POA: Diagnosis not present

## 2023-09-09 DIAGNOSIS — M810 Age-related osteoporosis without current pathological fracture: Secondary | ICD-10-CM | POA: Diagnosis not present

## 2023-09-09 DIAGNOSIS — I428 Other cardiomyopathies: Secondary | ICD-10-CM | POA: Diagnosis not present

## 2023-09-14 ENCOUNTER — Telehealth (HOSPITAL_COMMUNITY): Payer: Self-pay

## 2023-09-14 ENCOUNTER — Encounter (HOSPITAL_COMMUNITY): Payer: Medicare PPO

## 2023-09-14 NOTE — Telephone Encounter (Signed)
 Reached out to [place] about a home paramedicine visit for Izetta in her absence today and Ms. Probus reports she has what she needs right now and is getting in home training for dialysis yesterday and today but asked for me to follow up for a potential home visit. She agreed. I will follow up or Izetta will if she is back in office.   Powell Mirza, EMT-Paramedic [phone] 09/14/2023

## 2023-09-16 NOTE — Progress Notes (Deleted)
 Office Visit Note  Patient: Tracy Nelson             Date of Birth: 18-Aug-1945           MRN: [id number]             PCP: [name], [name] Referring: [name], [name] Visit Date: 09/30/2023   Subjective:  No chief complaint on file.   History of Present Illness: Tracy Nelson is a 78 y.o. 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    Previous HPI 03/30/2023 Tracy Nelson is a 78 y.o. 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  Still has daily ongoing joint pain and stiffness in both hands with frequent swelling in the right index finger.  Also with left shoulder pain mostly when she is trying to reach overhead.  Joint aches are most prominent at nighttime.  Also has tenderness to pressure over her shins on both sides without much swelling.   Patient's daughter was present for visit today assisting with transportation.   Previous HPI 12/08/2022 Tracy Nelson is a 78 y.o. 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  Her azathioprine was discontinued after hospitalization in March due to symptomatic anemia thought in large part due to bone marrow suppression.  She has noticed some increase in joint pain and stiffness since she had the stopped azathioprine.  Biggest problem is limiting her is the decrease strength and mobility in her hands and also the left shoulder pain.   Previous HPI 09/29/22 Tracy Nelson is a 78 y.o. 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  Hydroxychloroquine was previously discontinued that she took a much of last year but had concern by ophthalmologist for safety of retinal toxicity monitoring.  She is  recently out of the prednisone due to medication refill.  Since stopping the medication she is noticing substantial increase in fatigue and joint pain and stiffness worse in multiple areas.  Especially bilateral upper extremities doing worse.   Previous HPI 09/23/21 Tracy Nelson is a 78 y.o. female here for follow up for seronegative RA complicated with corneal ulcerations we increased azathioprine dose to 75 mg daily and increased prednisone temporarily. I discussed plan with Dr. Noel Gerold also planning to discontinue hydroxychloroquine over concern about risk and ability to monitor safety. She continues to have eye inflammation worse on the left side. She notices some eye pain and headaches associated with blurry vision. Often around using eye drops or first thing in the morning. She had lab work last month for Dr. Ronelle Nigh with normal blood counts renal function appeared slightly worse.   Previous HPI 08/20/21 Tracy Nelson is a 78 y.o. female here for follow up for seronegative RA on HCQ 200 mg daily and prednisone 5 mg daily. Labs checked at hospital December 2022 with eGFR stable at 29. Joint symptoms are not significantly changed since last visit. However she saw ophthalmology in December with increased eye irritation and redness found to have ulceration in the left eye consistent with active RA. She also continued restasis and artificial tears for severe dryness. She reports seeing some darkening or rash on her face does not have any abnormal sensation.  Previous HPI 01/09/21 Tracy Nelson is a 78 y.o. female here for seronegative rheumatoid arthritis previously a patient with Dr. Steele Berg in [place], [place]. She currently taken hydroxychloroquine 200 mg PO daily and prednisone 5 mg PO daily and has not experienced a severe flare or increase in steroid dosing since about a year ago.  She also experiences sicca symptoms currently treated with Restasis.  She was previously taking Imuran but is not  clear if she is still taking this and how much is contributory to disease control. 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  She has also previously undergone some fibromyalgia tender point injection with significant benefit of back pain symptoms. 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 She has upcoming nephrology appointment next week for her CKD and due to frequent hard sticks prefers if lab draws may be consolidated with frequent bruising and discomfort associated.   No Rheumatology ROS completed.   PMFS History:  Patient Active Problem List   Diagnosis Date Noted   Vaginal irritation 07/28/2023   Acute clinical systolic heart failure (HCC) 07/10/2023   Acute on chronic systolic CHF (congestive heart failure) (HCC) 07/10/2023   CHF exacerbation (HCC) 07/09/2023   Elevated troponin 07/09/2023   PAF (paroxysmal atrial fibrillation) (HCC) 06/08/2023   Influenza vaccination declined 06/08/2023   Herpes zoster vaccination declined 06/08/2023   COVID-19 vaccination declined 06/08/2023   Bumps on skin 02/03/2023   Acquired thrombophilia (HCC) 02/03/2023   Prediabetes 01/13/2023   Prolonged QT interval 10/30/2022   Cataract 09/14/2022   Uterine prolapse 09/14/2022   Pressure ulcer 09/03/2022   Acute heart failure with reduced ejection fraction (HFrEF, <= 40%) (HCC) 08/30/2022   Anemia of chronic disease 08/30/2022   Chronic systolic CHF (congestive heart failure) (HCC) 07/13/2022   CKD (chronic kidney disease), stage IV (HCC) 07/09/2022   Corneal melt, 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   Monoclonal gammopathy 03/10/2021   Atypical chest pain 10/22/2020   CKD (chronic kidney disease), stage III (HCC) 10/22/2020   Atrial flutter (HCC) 10/18/2020   Arthritis 07/15/2020   GERD (gastroesophageal reflux disease) 07/15/2020   Non-ischemic  cardiomyopathy (HCC) 07/15/2020   HFrEF (heart failure with reduced ejection fraction) (HCC) 07/15/2020   HTN (hypertension) 07/15/2020   Osteoporosis 07/15/2020   Raynaud's disease 05/05/2019   Rheumatoid arthritis involving multiple sites with positive rheumatoid factor (HCC) 10/28/2015   Mitral regurgitation 05/30/2014   Vitamin D deficiency 05/15/2013   Encounter for long-term (current) use of other medications 12/14/2008   Sjogren's syndrome (HCC) 12/14/2008    Past Medical History:  Diagnosis Date   Acid reflux 07/15/2020   Aortic insufficiency    Arthritis 07/15/2020   Atrial flutter (HCC)    Colitis 07/15/2020   Dilated cardiomyopathy (HCC) 07/15/2020   HFrEF (heart failure with reduced ejection fraction) (HCC) 07/15/2020   HTN (hypertension) 07/15/2020   Mitral regurgitation    Osteoporosis 07/15/2020   PAF (paroxysmal atrial fibrillation) (HCC)    Palpitations 07/15/2020   Rheumatoid arthritis (HCC) 07/15/2020   Tricuspid regurgitation     Family History  Problem Relation Age of Onset   Hypertension Mother    Diabetes Mother    Hypertension Father    Diabetes Father    Breast cancer Maternal Aunt    Breast cancer Paternal Aunt    Arthritis Maternal Grandmother    Lung disease Paternal Grandfather    Cancer Brother    Past Surgical  History:  Procedure Laterality Date   CARDIOVERSION N/A 11/08/2020   Procedure: CARDIOVERSION;  Surgeon: Laurey Morale, MD;  Location: [facility] ENDOSCOPY;  Service: Cardiovascular;  Laterality: N/A;   HIP FRACTURE SURGERY     INTRAMEDULLARY (IM) NAIL INTERTROCHANTERIC Left 07/10/2022   Procedure: INTRAMEDULLARY (IM) NAIL INTERTROCHANTERIC;  Surgeon: Tarry Kos, MD;  Location: [facility] OR;  Service: Orthopedics;  Laterality: Left;   RIGHT HEART CATH N/A 08/14/2020   Procedure: RIGHT HEART CATH;  Surgeon: Laurey Morale, MD;  Location: [facility] INVASIVE CV LAB;  Service: Cardiovascular;  Laterality: N/A;   RIGHT HEART CATH N/A 09/03/2022    Procedure: RIGHT HEART CATH;  Surgeon: Laurey Morale, MD;  Location: [facility] INVASIVE CV LAB;  Service: Cardiovascular;  Laterality: N/A;   TEE WITHOUT CARDIOVERSION N/A 11/08/2020   Procedure: TRANSESOPHAGEAL ECHOCARDIOGRAM (TEE);  Surgeon: Laurey Morale, MD;  Location: [facility] ENDOSCOPY;  Service: Cardiovascular;  Laterality: N/A;   Social History   Social History Narrative   Not on file   There is no immunization history for the selected administration types on file for this patient.   Objective: Vital Signs: There were no vitals taken for this visit.   Physical Exam   Musculoskeletal Exam: ***  CDAI Exam: CDAI Score: -- Patient Global: --; Provider Global: -- Swollen: --; Tender: -- Joint Exam 09/30/2023   No joint exam has been documented for this visit   There is currently no information documented on the homunculus. Go to the Rheumatology activity and complete the homunculus joint exam.  Investigation: No additional findings.  Imaging: No results found.  Recent Labs: Lab Results  Component Value Date   WBC 7.8 07/16/2023   HGB 12.3 08/31/2023   PLT 198 07/16/2023   NA 137 09/07/2023   K 4.0 09/07/2023   CL 105 09/07/2023   CO2 22 09/07/2023   GLUCOSE 100 (H) 09/07/2023   BUN 92 (H) 09/07/2023   CREATININE 4.05 (H) 09/07/2023   BILITOT 0.7 09/07/2023   ALKPHOS 68 09/07/2023   AST 21 09/07/2023   ALT 29 09/07/2023   PROT 6.5 09/07/2023   ALBUMIN 3.1 (L) 09/07/2023   CALCIUM 9.6 09/07/2023    Speciality Comments: No specialty comments available.  Procedures:  No procedures performed Allergies: Baclofen and Penicillins   Assessment / Plan:     Visit Diagnoses: No diagnosis found.  ***  Orders: No orders of the defined types were placed in this encounter.  No orders of the defined types were placed in this encounter.    Follow-Up Instructions: No follow-ups on file.   Metta Clines, RT  Note - This record has been created using [employer].  Chart creation errors have been sought, but may not always  have been located. Such creation errors do not reflect on  the standard of medical care.

## 2023-09-21 ENCOUNTER — Telehealth (HOSPITAL_COMMUNITY): Payer: Self-pay

## 2023-09-21 ENCOUNTER — Telehealth (HOSPITAL_COMMUNITY): Payer: Self-pay | Admitting: Cardiology

## 2023-09-21 NOTE — Telephone Encounter (Signed)
Reached out to pt to see about a home visit this week.  I know she has been moving forward with at home dialysis and getting training sessions. She reports someone will be coming out to the house to look at her space for the equipment set up.   She also reports that she just left message for clinic about her back hurting.  She reports pain to the upper, center of back pain that has been going on for several days now. She does take tylenol and it resolves pain but then wears off and comes right back.  She denies increased sob.  She does report pain with moving, she reports in the past she has had muscle spasms but in the lower part of her back, not where the pain is now.  I advised her it would be best to be evaluated in the ER to f/u on this as it could be something more serious and unable to diagnosis over the phone.  Advised her the ER can do imaging/tests/labs to figure out what is going on or to call 911 if she needs to.   She isnt sure she is going but is contemplating it.   Kerry Hough, EMT-Paramedic  [phone] 09/21/2023

## 2023-09-21 NOTE — Telephone Encounter (Signed)
It is ok for her to take the methocarbamol.

## 2023-09-21 NOTE — Telephone Encounter (Signed)
Patient called to request add on appt for today. Reports upper back pain, reports pain increases when she rotates/turns  -denies CP, SOB, dizziness, HA,palpitations  .-otherwise no cardiac symptoms  Pt concerned its muscle spasms Reports pain in upper back has been going on the past three days and increased pains daily    -no vitals to report  Methocarbamol given a few months back would like to know if its ok to take with her cardiac condition   Advised to also reach out to pcp in the event above not 100% cardiac in nature -forward to provider for further input -forward to PharmD for medication reconciliation including methocarbamol

## 2023-09-21 NOTE — Telephone Encounter (Signed)
Ok to take as needed.   Rodolfo Gaster NP-C  12:48 PM

## 2023-09-22 ENCOUNTER — Ambulatory Visit (INDEPENDENT_AMBULATORY_CARE_PROVIDER_SITE_OTHER): Payer: Medicare PPO | Admitting: Family Medicine

## 2023-09-22 ENCOUNTER — Encounter: Payer: Self-pay | Admitting: Family Medicine

## 2023-09-22 ENCOUNTER — Other Ambulatory Visit: Payer: Self-pay

## 2023-09-22 VITALS — BP 110/68 | HR 80 | Temp 98.1°F

## 2023-09-22 DIAGNOSIS — R1030 Lower abdominal pain, unspecified: Secondary | ICD-10-CM | POA: Diagnosis not present

## 2023-09-22 DIAGNOSIS — N184 Chronic kidney disease, stage 4 (severe): Secondary | ICD-10-CM

## 2023-09-22 DIAGNOSIS — M549 Dorsalgia, unspecified: Secondary | ICD-10-CM | POA: Diagnosis not present

## 2023-09-22 DIAGNOSIS — D6869 Other thrombophilia: Secondary | ICD-10-CM

## 2023-09-22 DIAGNOSIS — M62830 Muscle spasm of back: Secondary | ICD-10-CM

## 2023-09-22 NOTE — Telephone Encounter (Signed)
Pt aware and voiced understanding

## 2023-09-22 NOTE — Assessment & Plan Note (Signed)
Patient is on Eliquis 2.5 Mg twice daily for PAF.

## 2023-09-22 NOTE — Assessment & Plan Note (Signed)
Continue muscle relaxer as needed and tylenol

## 2023-09-22 NOTE — Progress Notes (Unsigned)
I,[name], CMA,acting as a Neurosurgeon for [employer], NP.,have documented all relevant documentation on the behalf of Tracy Hose, NP,as directed by  Tracy Hose, NP while in the presence of Tracy Hose, NP.  Subjective:  [name] , female    DOB: 22-Feb-1946 , 78 y.o.   MRN: [id number]  Chief Complaint  Patient presents with  . Back Pain    HPI  Patient is a 78 year old female that presents today for upper back pain. She describes the pain has achy, sharp and stabbing with spasms. Patient states that her pain has been as bad as 10/10 but right now it is a 8/10. She states that she has used one remaining tablet from a muscle spasm and it helped her. Patient also complains of pain in her lower abdomen, she states that she feels like she has a belt around her stomach.  She states that she has used Tylenol but got temporary relief. Patient has CHF and her Torsemide was increased to 60 mg in the AM and 40 mg at bedtime by her cardiologist two weeks. She  refused a BNP today.  Patient denies any constipation, nausea or diarrhea.    Past Medical History:  Diagnosis Date  . Acid reflux 07/15/2020  . Aortic insufficiency   . Arthritis 07/15/2020  . Atrial flutter (HCC)   . Colitis 07/15/2020  . Dilated cardiomyopathy (HCC) 07/15/2020  . HFrEF (heart failure with reduced ejection fraction) (HCC) 07/15/2020  . HTN (hypertension) 07/15/2020  . Mitral regurgitation   . Osteoporosis 07/15/2020  . PAF (paroxysmal atrial fibrillation) (HCC)   . Palpitations 07/15/2020  . Rheumatoid arthritis (HCC) 07/15/2020  . Tricuspid regurgitation      Family History  Problem Relation Age of Onset  . Hypertension Mother   . Diabetes Mother   . Hypertension Father   . Diabetes Father   . Breast cancer Maternal Aunt   . Breast cancer Paternal Aunt   . Arthritis Maternal Grandmother   . Lung disease Paternal Grandfather   . Cancer Brother      Current Outpatient  Medications:  .  acetaminophen (TYLENOL) 500 MG tablet, Take 500 mg by mouth every 6 (six) hours as needed for moderate pain (pain score 4-6)., Disp: , Rfl:  .  amiodarone (PACERONE) 200 MG tablet, Take 1 tablet (200 mg total) by mouth daily. Take 200 mg by mouth once daily, Disp: , Rfl:  .  calcitRIOL (ROCALTROL) 0.25 MCG capsule, Take 2 capsules by mouth daily., Disp: , Rfl:  .  ELIQUIS 2.5 MG TABS tablet, TAKE 1 TABLET BY MOUTH TWICE A DAY, Disp: 60 tablet, Rfl: 5 .  Ensure (ENSURE), Take 237 mLs by mouth every other day., Disp: , Rfl:  .  folic acid (FOLVITE) 1 MG tablet, TAKE 1 TABLET BY MOUTH EVERY DAY, Disp: 30 tablet, Rfl: 11 .  hydroxypropyl methylcellulose / hypromellose (ISOPTO TEARS / GONIOVISC) 2.5 % ophthalmic solution, Place 1 drop into both eyes as needed for dry eyes., Disp: , Rfl:  .  Multiple Vitamin (MULTIVITAMIN) tablet, Take 1 tablet by mouth 2 (two) times a week., Disp: , Rfl:  .  omeprazole (PRILOSEC) 40 MG capsule, TAKE 1 CAPSULE (40 MG TOTAL) BY MOUTH DAILY AS NEEDED (FOR ACID REFLUX)., Disp: 90 capsule, Rfl: 1 .  potassium chloride (KLOR-CON M) 10 MEQ tablet, Take 2 tablets (20 mEq total) by mouth daily., Disp: 60 tablet, Rfl: 11 .  predniSONE (DELTASONE) 5 MG tablet, Take  1 tablet (5 mg total) by mouth daily with breakfast., Disp: 90 tablet, Rfl: 1 .  sodium bicarbonate 650 MG tablet, Take 650 mg by mouth 2 (two) times daily., Disp: , Rfl:  .  torsemide (DEMADEX) 20 MG tablet, Take 3 tablets (60 mg total) by mouth in the morning AND 2 tablets (40 mg total) every evening., Disp: 200 tablet, Rfl: 3   Allergies  Allergen Reactions  . Baclofen Nausea And Vomiting  . Penicillins Rash     Review of Systems  Constitutional: Negative.   HENT: Negative.    Eyes: Negative.   Respiratory: Negative.    Cardiovascular: Negative.   Gastrointestinal:  Positive for abdominal pain.  Musculoskeletal:  Positive for back pain and gait problem.       She came in a wheelchair      Today's Vitals   09/22/23 0910  BP: 110/68  Pulse: 80  Temp: 98.1 F (36.7 C)  TempSrc: Oral  PainSc: 8   PainLoc: Back   There is no height or weight on file to calculate BMI.  Wt Readings from Last 3 Encounters:  09/07/23 103 lb (46.7 kg)  09/02/23 95 lb (43.1 kg)  08/19/23 103 lb 9.6 oz (47 kg)    The 10-year ASCVD risk score (Arnett DK, et al., 2019) is: 19.6%   Values used to calculate the score:     Age: 71 years     Sex: Female     Is Non-Hispanic African American: Yes     Diabetic: No     Tobacco smoker: No     Systolic Blood Pressure: 110 mmHg     Is BP treated: Yes     HDL Cholesterol: 91 mg/dL     Total Cholesterol: 205 mg/dL  Objective:  Physical Exam HENT:     Head: Normocephalic.  Pulmonary:     Breath sounds: No wheezing, rhonchi or rales.  Musculoskeletal:        General: Tenderness present.  Skin:    General: Skin is warm and dry.  Neurological:     General: No focal deficit present.     Mental Status: She is alert and oriented to person, place, and time.        Assessment And Plan:  Upper back pain -     DG Thoracic Spine 2 View; Future  Lower abdominal pain -     CT ABDOMEN PELVIS W WO CONTRAST; Future  Acquired thrombophilia (HCC) Assessment & Plan: Patient is on Eliquis 2.5 Mg twice daily for PAF.   Back muscle spasm Assessment & Plan: Continue muscle relaxer and tylenol   CKD (chronic kidney disease), stage IV (HCC)    Return if symptoms worsen or fail to improve, for Keep up next appt..  Patient was given opportunity to ask questions. Patient verbalized understanding of the plan and was able to repeat key elements of the plan. All questions were answered to their satisfaction.    I, Tracy Hose, NP, have reviewed all documentation for this visit. The documentation on 09/22/23 for the exam, diagnosis, procedures, and orders are all accurate and complete.   IF YOU HAVE BEEN REFERRED TO A SPECIALIST, IT MAY TAKE 1-2 WEEKS  TO SCHEDULE/PROCESS THE REFERRAL. IF YOU HAVE NOT HEARD FROM US/SPECIALIST IN TWO WEEKS, PLEASE GIVE [place] A CALL AT [phone] X [phone].

## 2023-09-22 NOTE — Assessment & Plan Note (Signed)
Followed by Nephrology Dr Ella Jubilee, GFR 11; Dialysis soon.

## 2023-09-23 MED ORDER — METHOCARBAMOL 500 MG PO TABS
500.0000 mg | ORAL_TABLET | Freq: Two times a day (BID) | ORAL | 0 refills | Status: DC | PRN
Start: 2023-09-23 — End: 2023-11-09

## 2023-09-27 ENCOUNTER — Telehealth (HOSPITAL_COMMUNITY): Payer: Self-pay

## 2023-09-27 NOTE — Telephone Encounter (Signed)
Called to confirm/remind patient of their appointment [facility] on [facility].   Patient reminded to bring all medications and/or complete list.  Confirmed patient has transportation. Gave directions, instructed to utilize valet parking.  Confirmed appointment prior to ending call.

## 2023-09-28 ENCOUNTER — Encounter (HOSPITAL_COMMUNITY): Payer: Self-pay

## 2023-09-28 ENCOUNTER — Other Ambulatory Visit: Payer: Self-pay | Admitting: Family Medicine

## 2023-09-28 ENCOUNTER — Ambulatory Visit (HOSPITAL_COMMUNITY)
Admission: RE | Admit: 2023-09-28 | Discharge: 2023-09-28 | Disposition: A | Discharge status: Home / Self Care | Payer: Medicare PPO | Source: Ambulatory Visit | Attending: Family Medicine

## 2023-09-28 ENCOUNTER — Encounter (HOSPITAL_COMMUNITY): Payer: Medicare PPO

## 2023-09-28 ENCOUNTER — Other Ambulatory Visit (HOSPITAL_COMMUNITY): Payer: Self-pay

## 2023-09-28 VITALS — BP 94/78 | HR 87 | Wt 103.0 lb

## 2023-09-28 DIAGNOSIS — I484 Atypical atrial flutter: Secondary | ICD-10-CM | POA: Diagnosis not present

## 2023-09-28 DIAGNOSIS — I5020 Unspecified systolic (congestive) heart failure: Secondary | ICD-10-CM

## 2023-09-28 DIAGNOSIS — R0602 Shortness of breath: Secondary | ICD-10-CM | POA: Diagnosis present

## 2023-09-28 DIAGNOSIS — I5022 Chronic systolic (congestive) heart failure: Secondary | ICD-10-CM

## 2023-09-28 DIAGNOSIS — N184 Chronic kidney disease, stage 4 (severe): Secondary | ICD-10-CM

## 2023-09-28 DIAGNOSIS — I4891 Unspecified atrial fibrillation: Secondary | ICD-10-CM | POA: Insufficient documentation

## 2023-09-28 DIAGNOSIS — D472 Monoclonal gammopathy: Secondary | ICD-10-CM | POA: Insufficient documentation

## 2023-09-28 DIAGNOSIS — D631 Anemia in chronic kidney disease: Secondary | ICD-10-CM

## 2023-09-28 DIAGNOSIS — R197 Diarrhea, unspecified: Secondary | ICD-10-CM

## 2023-09-28 DIAGNOSIS — M069 Rheumatoid arthritis, unspecified: Secondary | ICD-10-CM | POA: Diagnosis not present

## 2023-09-28 DIAGNOSIS — I428 Other cardiomyopathies: Secondary | ICD-10-CM | POA: Insufficient documentation

## 2023-09-28 DIAGNOSIS — Z7189 Other specified counseling: Secondary | ICD-10-CM | POA: Diagnosis not present

## 2023-09-28 DIAGNOSIS — I480 Paroxysmal atrial fibrillation: Secondary | ICD-10-CM | POA: Diagnosis not present

## 2023-09-28 DIAGNOSIS — R1030 Lower abdominal pain, unspecified: Secondary | ICD-10-CM

## 2023-09-28 DIAGNOSIS — Z7901 Long term (current) use of anticoagulants: Secondary | ICD-10-CM | POA: Diagnosis not present

## 2023-09-28 LAB — BASIC METABOLIC PANEL
Anion gap: 16 — ABNORMAL HIGH (ref 5–15)
BUN: 104 mg/dL — ABNORMAL HIGH (ref 8–23)
CO2: 22 mmol/L (ref 22–32)
Calcium: 9.6 mg/dL (ref 8.9–10.3)
Chloride: 100 mmol/L (ref 98–111)
Creatinine, Ser: 5.08 mg/dL — ABNORMAL HIGH (ref 0.44–1.00)
GFR, Estimated: 8 mL/min — ABNORMAL LOW (ref >60)
Glucose, Bld: 106 mg/dL — ABNORMAL HIGH (ref 70–99)
Potassium: 3.7 mmol/L (ref 3.5–5.1)
Sodium: 138 mmol/L (ref 135–145)

## 2023-09-28 MED ORDER — MIDODRINE HCL 2.5 MG PO TABS: 2.5000 mg | ORAL_TABLET | Freq: Three times a day (TID) | ORAL | 3 refills | Status: DC

## 2023-09-28 NOTE — Progress Notes (Signed)
Advanced Heart Failure Clinic Note   PCP: [name], [name] Nephrology: [name] HF Cardiologist: [name]  Chief Complaint: Shortness of breath  HPI: 78 y.o. with history of rheumatoid arthritis, CKD stage IV, and nonischemic cardiomyopathy was referred by [name] in [place] to establish heart failure care in [place].  Patient has been known to have a cardiomyopathy since 2015.  Cardiac MRI in 2015 showed EF 37% with no LGE.  Coronary angiography at that time showed no significant coronary disease.  Over the next few years, LV EF fluctuated up and down.  In 3/21, echo showed EF down to 20-25%.  She was admitted to the hospital in [place], [place] in 11/21 with CHF exacerbation.  Echo showed EF 20-25%, global hypokinesis, mildly decreased RV systolic function.   She was admitted in 3/22 with atrial fibrillation, converted to NSR on amiodarone. She went into atrial flutter after this and had TEE-DCCV in 4/22.  TEE showed EF < 20%, moderate LV dilation, mild RV dilation with moderate RV dysfunction, moderate central MR.  CPX in 3/22 showed severe HF limitation.  PYP scan 3/23 and likely negative.   She was evaluated for [facility] or ANTHEM, BNP too high and thought to be too frail.   Cardiac MRI in 10/22 showed mild LV dilation with EF 23%, normal RV size with RVEF 37%, ECV 34%, small area of subendocardial LGE in the mid inferolateral wall and small area of full thickness LGE in the basal  inferior wall.   Admitted 07/08/22 after mechanical fall resulting in left hip fracture. S/P left intramedullary nail 07/10/22.  Post op developed AKI. Echo showed EF improved to 45%.   Admitted in 1/24 with acute on chronic systolic CHF and AKI. She required milrinone, RHC showed CI 2.81 on milrinone 0.25.  Echo showed EF < 20%.  Suspected end stage HF.   Echo 3/24 EF 20-25%, mild LV dilation, severely decreased RV systolic function, mild RV enlargement, severe biatrial enlargement,  moderate-severe TR, moderate MR  She was admitted 3/13-3/24/24 w/ symptomatic anemia and dyspepsia. Hgb 6.8,  transfused 2 uRBCs. GI recommended conservative management. There was no overt blood loss-FOBT was negative-gastroenterology did not feel endoscopic evaluation was warranted. She was started on Protonix. Hospital course was c/b by volume overload.   Acute visit 05/27/23, mildly volume overloaded and in atrial fibrillation with RVR. Amiodarone increased to 200 mg bid x 1 week. She ultimately chemically converted to NSR and TEE/DCCV aborted.  She was admitted 07/09/23 with acute on chronic CHF and AKI on CKD. Diuresed with IV lasix. Scr bumped to 4.2, improved to 4.0 after diuretic held a couple of days. Palliative care consulted for GOC discussions d/t end-stage HF. She was not ready to consider hospice.  Today she returns for HF follow up with her son. Feels slightly better with increasing the torsemide dose. Weight at home 93-94 pounds. Has swelling in lower extremities and abdominal bloating. Breathing OK. Says heart is "tingly" intermittently for a few weeks. Nothing makes it better or worse. Also feels tremors in her legs that seem to coincide with these symptoms. Intermittent dizziness, no falls. Denies palpitations, chest pain, orthopnea, or abnormal bleeding. Has had diarrhea x several days, after taking medications. Has a poor appetite. She is doing PD classes in preparation for peritoneal dialysis.  ReDs reading: 31 %, normal  Labs (12/24): K 3.8, creatinine 4.03 Labs (08/19/23): BNP 2990, K 4.1, creatinine 4.29, BUN 105 Labs (09/07/23): BNP 3754, K 4, Scr 4.05,  BUN 92  ECG (personally reviewed): NSR 84  PMH: 1. GERD 2. Rheumatoid arthritis: Followed by rheumatology in [place].  - No known pulmonary disease from RA.  3. Osteoporosis. 4. Gout 5. CKD stage 4 6. H/o SVT 7. Chronic systolic CHF: She was found to have nonischemic cardiomyopathy with low EF in 2015.  - LHC  (2015): Normal coronaries.  - cardiac MRI (2015): EF 37%, no LGE - Echo (3/21): EF 20-25%. - Echo (11/21): EF 20-25%, moderate LV dilation, global hypokinesis, biatrial enlargement, mildly decreased RV systolic function.  - Echo (12/21): EF < 20%, moderate LV dilation, normal RV size/systolic function, moderate MR, IVC normal.  - RHC (1/22): Mean RA 1, PA 39/14, mean PCWP 10, PVR 3.9 WU, CI 2.22.  - CPX (3/22): peak VO2 10.1, VE/VCO2 55, RER 1.13.  Severe functional limitation due to HF.  - TEE (4/22): EF < 20%, moderate LV dilation, mild RV dilation with moderate RV dysfunction, moderate central MR. - PYP scan (4/22): grade 1 with H/CL 1.48. Equivocal. [employer] gene testing negative. - Cardiac MRI (10/22): mild LV dilation with EF 23%, normal RV size with RVEF 37%, ECV 34%, small area of subendocardial LGE in the mid inferolateral wall and small area of full thickness LGE in the basal inferior wall.  - PYP scan (3/23): grade 1, H/CL 1.3.  Probably negative.  - Echo 07/11/22: EF 45-50% RV normal  - Echo (3/24): EF 20-25%, mild LV dilation, severely decreased RV systolic function, mild RV enlargement, severe biatrial enlargement, moderate-severe TR, moderate MR, IVC normal.  8. Atrial fibrillation/flutter: TEE-DCCV in 4/22.  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  10. Varicella zoster with post-herpetic neuralgia 11. L hip fracture , mechanical fall.  12. Anemia  SH: From [place] but moved to [place] to live with daughter in 2021.  Has 3 children.  Nonsmoker.  No ETOH.   FH: Mother with CHF, father with MI.   ROS: All systems reviewed and negative except as per HPI.   Current Outpatient Medications  Medication Sig Dispense Refill   acetaminophen (TYLENOL) 500 MG tablet Take 500 mg by mouth every 6 (six) hours as needed for moderate pain (pain score 4-6).     amiodarone (PACERONE) 200 MG tablet Take 1 tablet (200 mg total) by mouth daily. Take 200 mg by mouth once  daily     calcitRIOL (ROCALTROL) 0.25 MCG capsule Take 2 capsules by mouth daily.     ELIQUIS 2.5 MG TABS tablet TAKE 1 TABLET BY MOUTH TWICE A DAY 60 tablet 5   Ensure (ENSURE) Take 237 mLs by mouth every other day.     folic acid (FOLVITE) 1 MG tablet TAKE 1 TABLET BY MOUTH EVERY DAY 30 tablet 11   hydroxypropyl methylcellulose / hypromellose (ISOPTO TEARS / GONIOVISC) 2.5 % ophthalmic solution Place 1 drop into both eyes as needed for dry eyes.     methocarbamol (ROBAXIN) 500 MG tablet Take 1 tablet (500 mg total) by mouth 2 (two) times daily as needed for muscle spasms. 30 tablet 0   Multiple Vitamin (MULTIVITAMIN) tablet Take 1 tablet by mouth 2 (two) times a week.     omeprazole (PRILOSEC) 40 MG capsule TAKE 1 CAPSULE (40 MG TOTAL) BY MOUTH DAILY AS NEEDED (FOR ACID REFLUX). 90 capsule 1   potassium chloride (KLOR-CON M) 10 MEQ tablet Take 2 tablets (20 mEq total) by mouth daily. 60 tablet 11   predniSONE (DELTASONE) 5 MG tablet Take 1 tablet (  5 mg total) by mouth daily with breakfast. 90 tablet 1   sodium bicarbonate 650 MG tablet Take 650 mg by mouth 2 (two) times daily.     torsemide (DEMADEX) 20 MG tablet Take 3 tablets (60 mg total) by mouth in the morning AND 2 tablets (40 mg total) every evening. 200 tablet 3   No current facility-administered medications for this encounter.   Wt Readings from Last 3 Encounters:  09/28/23 46.7 kg  09/07/23 46.7 kg  09/02/23 43.1 kg   BP 94/78   Pulse 87   Wt 46.7 kg   SpO2 96%   BMI 18.25 kg/m   PHYSICAL EXAM: General:  NAD. No resp difficulty, arrived in [facility], elderly, chronically-ill appearing HEENT: Normal Neck: Supple. No JVD. Cor: Regular rate & rhythm. No rubs, gallops or murmurs. Lungs: Clear Abdomen: Soft, nontender, nondistended.  Extremities: No cyanosis, clubbing, rash, edema Neuro: Alert & oriented x 3, moves all 4 extremities w/o difficulty. Affect pleasant.  Assessment/Plan: 1. Chronic Systolic CHF: Nonischemic  cardiomyopathy.  This has been known since 2015, cath in 2015 showed no coronary disease and cardiac MRI in 2015 showed no LGE.  Cause is uncertain, familial cardiomyopathy is a concern given nonischemic cardiomyopathy in her mother.  Cannot rule out remote viral myocarditis. RHC (1/22) showed low filling pressures and actually a relatively preserved cardiac index of 2.22.  PYP scan in 3/23 likely negative.  Cardiac MRI in 10/22 showed mild LV dilation with EF 23%, normal RV size with RVEF 37%, ECV 34%, small area of subendocardial LGE in the mid inferolateral wall and small area of full thickness LGE in the basal inferior wall.   This was not suggestive of cardiac amyloidosis but possibly could be consistent with coronary embolism (though no LV thrombus visualized) versus prior myocarditis versus sarcoidosis.  [employer] gene testing for TTR mutations was negative. Cardiac MRI not consistent with amyloidosis.  She has MGUS with monoclonal IgG paraprotein, but based on cMRI and slow progression, think AL amyloidosis is unlikely.  Most recent echo 3/24 with EF 20-25%, mild LV dilation, severely decreased RV systolic function, mild RV enlargement, severe biatrial enlargement, moderate-severe TR, moderate MR. She was admitted in 1/24 and required milrinone. Chronically NYHA class IV.  GDMT severely limited by low BP and CKD IV. 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 Appears mostly euvolemic on exam. BP soft 94/78 today.  - With soft BP, start midodrine 2.5 mg tid. - Continue torsemide 60 qam/40 qpm. BMET today. - Does not have an ICD, would not place with end stage HF.   - Continue compression hose. 2. CKD: Stage 4.  She is followed by [name]. 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 She would not do well with dialysis from a heart standpoint. She would be better served with hospice services. Discussed with [name]. - Start midodrine as above. 3. Rheumatoid arthritis: No history of lung involvement. She  has been on a low dose of prednisone chronically. Now off Imuran, felt to be contributing to anemia  - She follows with Dr. Dimple Casey. 4. Atrial fibrillation/flutter: S/p TEE-DCCV in 4/22. She is now on amiodarone.  She was in atypical atrial flutter 10/24. She does not tolerate atrial arrhythmias. NSR today on ECG. - Continue amiodarone 200 mg daily.  LFTs and TSH 09/07/23 normal. Will need regular eye exam.  - Continue Eliquis 5 mg bid. No bleeding issues. 5. Anemia: She is receiving iron infusions per nephrology. 6. GOC: Seen by Palliative Care during recent  admit and referred for outpatient Palliative. Not ready for hospice. Remains Full Code. She is gradually declining. Can readdress hospice when she is ready.  7. Diarrhea: Recommend OTC Imodium as needed. Otherwise, needs PCP follow up. - Labs today  Follow up in 4 weeks with APP.  Prince Rome, [name]-BC 09/28/23

## 2023-09-28 NOTE — Patient Instructions (Signed)
START Midodrine 2.5 mg Three times a day  Labs done today, your results will be available in MyChart, we will contact you for abnormal readings.  Try over the counter Imodium as needed for diarrhea   Your physician recommends that you schedule a follow-up appointment as scheduled   If you have any questions or concerns before your next appointment please send [place] a message through [facility] or call our office at [phone].    TO LEAVE A MESSAGE FOR THE NURSE SELECT OPTION 2, PLEASE LEAVE A MESSAGE INCLUDING: YOUR NAME DATE OF BIRTH CALL BACK NUMBER REASON FOR CALL**this is important as we prioritize the call backs  YOU WILL RECEIVE A CALL BACK THE SAME DAY AS LONG AS YOU CALL BEFORE 4:00 PM  At the [facility], you and your health needs are our priority. As part of our continuing mission to provide you with exceptional heart care, we have created designated Provider Care Teams. These Care Teams include your primary Cardiologist (physician) and Advanced Practice Providers (APPs- Physician Assistants and Nurse Practitioners) who all work together to provide you with the care you need, when you need it.   You may see any of the following providers on your designated Care Team at your next follow up: Dr Arvilla Meres Dr Marca Ancona Dr. Dorthula Nettles Dr. Clearnce Hasten Amy Filbert Schilder, NP Robbie Lis, [place] [facility] [place], [place] Brynda Peon, NP [place] Lee, NP Karle Plumber, PharmD   Please be sure to bring in all your medications bottles to every appointment.    Thank you for choosing [facility]

## 2023-09-28 NOTE — Progress Notes (Signed)
Paramedicine Encounter   Patient ID: [name] , female,   DOB: 05/14/1946,78 y.o.,  MRN: [id number]   Met patient in clinic today with provider.  Weight @ clinic-103 B/P-94/78 P-87 [id number]  She feels ok, she still having back pain but was given muscle relaxer.  Her weight is same today as it was last time.  She told nurse "heart tingling"  that comes and goes and its been going on the past few wks.  Nothing makes its better or worse, goes away on its own.  No palpitations.  Tremors in rt leg that just started.   She is continuing the classes for in home dialysis. Unknown projected time frame of start date per pt.  She reports a lot of abd discomfort when taking meds and causes diarrhea. She wanted to know how are they doing any good if its coming back out.  Doesn't  happen all the time but occurring more often recently.   From a heart standpoint, provider does not think her heart can tolerate any form of dialysis but Shanda Bumps will talk with dr Shirlee Latch and then reach out to kidney doctors.    EKG done today-SR  REDS clip- 31% Labs today-will look later to see if any changes are needed  Will f/u next week.   Med changes- Midodrine 2.5mg  TID   Kerry Hough, EMT-Paramedic [phone] 09/28/2023

## 2023-09-28 NOTE — Progress Notes (Signed)
ReDS Vest / Clip - 09/28/23 1000       ReDS Vest / Clip   Station Marker A    Ruler Value 26    ReDS Value Range Low volume    ReDS Actual Value 31

## 2023-09-30 ENCOUNTER — Ambulatory Visit: Payer: Medicare PPO | Admitting: Internal Medicine

## 2023-09-30 DIAGNOSIS — Z7952 Long term (current) use of systemic steroids: Secondary | ICD-10-CM

## 2023-09-30 DIAGNOSIS — H16003 Unspecified corneal ulcer, bilateral: Secondary | ICD-10-CM

## 2023-09-30 DIAGNOSIS — N184 Chronic kidney disease, stage 4 (severe): Secondary | ICD-10-CM

## 2023-09-30 DIAGNOSIS — M0579 Rheumatoid arthritis with rheumatoid factor of multiple sites without organ or systems involvement: Secondary | ICD-10-CM

## 2023-10-05 DIAGNOSIS — H16123 Filamentary keratitis, bilateral: Secondary | ICD-10-CM | POA: Diagnosis not present

## 2023-10-06 ENCOUNTER — Encounter: Payer: Medicare PPO | Attending: Nephrology | Admitting: Internal Medicine

## 2023-10-06 ENCOUNTER — Encounter: Payer: Self-pay | Admitting: Internal Medicine

## 2023-10-06 ENCOUNTER — Ambulatory Visit: Payer: Medicare PPO | Attending: Internal Medicine

## 2023-10-06 ENCOUNTER — Ambulatory Visit: Payer: Medicare PPO

## 2023-10-06 VITALS — BP 84/58 | HR 79 | Resp 14 | Ht 64.0 in | Wt 95.0 lb

## 2023-10-06 DIAGNOSIS — M0579 Rheumatoid arthritis with rheumatoid factor of multiple sites without organ or systems involvement: Secondary | ICD-10-CM

## 2023-10-06 DIAGNOSIS — M79642 Pain in left hand: Secondary | ICD-10-CM

## 2023-10-06 DIAGNOSIS — M79641 Pain in right hand: Secondary | ICD-10-CM | POA: Diagnosis not present

## 2023-10-06 DIAGNOSIS — M3500 Sicca syndrome, unspecified: Secondary | ICD-10-CM

## 2023-10-06 NOTE — Progress Notes (Unsigned)
 Office Visit Note  Patient: Tracy Nelson             Date of Birth: Jan 01, 1946           MRN: [id number]             PCP: [name], [name] Referring: [name], [name] Visit Date: 10/06/2023   Subjective:  Follow-up   History of Present Illness: Tracy Nelson is a 78 y.o. 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    *** Son present transportation  Previous HPI 03/30/2023 Tracy Nelson is a 78 y.o. 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  Still has daily ongoing joint pain and stiffness in both hands with frequent swelling in the right index finger.  Also with left shoulder pain mostly when she is trying to reach overhead.  Joint aches are most prominent at nighttime.  Also has tenderness to pressure over her shins on both sides without much swelling.    Previous HPI 12/08/2022 Tracy Nelson is a 78 y.o. 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  Her azathioprine was discontinued after hospitalization in March due to symptomatic anemia thought in large part due to bone marrow suppression.  She has noticed some increase in joint pain and stiffness since she had the stopped azathioprine.  Biggest problem is limiting her is the decrease strength and mobility in her hands and also the left shoulder pain.   Previous HPI 09/29/22 Tracy Nelson is a 78 y.o. 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  Hydroxychloroquine was previously discontinued that she took a much of last year but had concern by ophthalmologist for safety of retinal toxicity monitoring.  She is recently out of the prednisone due to  medication refill.  Since stopping the medication she is noticing substantial increase in fatigue and joint pain and stiffness worse in multiple areas.  Especially bilateral upper extremities doing worse.   Previous HPI 09/23/21 Tracy Nelson is a 78 y.o. female here for follow up for seronegative RA complicated with corneal ulcerations we increased azathioprine dose to 75 mg daily and increased prednisone temporarily. I discussed plan with Dr. Noel Gerold also planning to discontinue hydroxychloroquine over concern about risk and ability to monitor safety. She continues to have eye inflammation worse on the left side. She notices some eye pain and headaches associated with blurry vision. Often around using eye drops or first thing in the morning. She had lab work last month for Dr. Ronelle Nigh with normal blood counts renal function appeared slightly worse.   Previous HPI 08/20/21 Tracy Nelson is a 78 y.o. female here for follow up for seronegative RA on HCQ 200 mg daily and prednisone 5 mg daily. Labs checked at hospital December 2022 with eGFR stable at 29. Joint symptoms are not significantly changed since last visit. However she saw ophthalmology in December with increased eye irritation and redness found to have ulceration in the left eye consistent with active RA. She also continued restasis and artificial tears for severe dryness. She reports seeing some darkening or rash on her face does not have any abnormal sensation.   Previous HPI 01/09/21 Tracy Nelson is  a 78 y.o. female here for seronegative rheumatoid arthritis previously a patient with Dr. Steele Berg in [place], [place]. She currently taken hydroxychloroquine 200 mg PO daily and prednisone 5 mg PO daily and has not experienced a severe flare or increase in steroid dosing since about a year ago.  She also experiences sicca symptoms currently treated with Restasis.  She was previously taking Imuran but is not clear if she is still taking this and  how much is contributory to disease control. 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  She has also previously undergone some fibromyalgia tender point injection with significant benefit of back pain symptoms. 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 She has upcoming nephrology appointment next week for her CKD and due to frequent hard sticks prefers if lab draws may be consolidated with frequent bruising and discomfort associated.   Review of Systems  Constitutional:  Positive for fatigue.  HENT:  Positive for mouth dryness. Negative for mouth sores.   Eyes:  Negative for dryness.  Respiratory:  Positive for shortness of breath.   Cardiovascular:  Positive for chest pain. Negative for palpitations.  Gastrointestinal:  Positive for diarrhea. Negative for blood in stool and constipation.  Endocrine: Negative for increased urination.  Genitourinary:  Negative for involuntary urination.  Musculoskeletal:  Positive for joint pain, joint pain, joint swelling, myalgias, muscle weakness, morning stiffness, muscle tenderness and myalgias. Negative for gait problem.  Skin:  Positive for hair loss. Negative for color change, rash and sensitivity to sunlight.  Allergic/Immunologic: Negative for susceptible to infections.  Neurological:  Positive for dizziness and headaches.  Hematological:  Negative for swollen glands.  Psychiatric/Behavioral:  Positive for sleep disturbance. Negative for depressed mood. The patient is not nervous/anxious.     PMFS History:  Patient Active Problem List   Diagnosis Date Noted   Back muscle spasm 09/22/2023   Lower abdominal pain 09/22/2023   Upper back pain 09/22/2023   Vaginal irritation 07/28/2023   Acute clinical systolic heart failure (HCC) 07/10/2023   Acute on chronic systolic CHF (congestive heart  failure) (HCC) 07/10/2023   CHF exacerbation (HCC) 07/09/2023   Elevated troponin 07/09/2023   PAF (paroxysmal atrial fibrillation) (HCC) 06/08/2023   Influenza vaccination declined 06/08/2023   Herpes zoster vaccination declined 06/08/2023   COVID-19 vaccination declined 06/08/2023   Bumps on skin 02/03/2023   Acquired thrombophilia (HCC) 02/03/2023   Prediabetes 01/13/2023   Prolonged QT interval 10/30/2022   Cataract 09/14/2022   Uterine prolapse 09/14/2022   Pressure ulcer 09/03/2022   Acute heart failure with reduced ejection fraction (HFrEF, <= 40%) (HCC) 08/30/2022   Anemia of chronic disease 08/30/2022   Chronic systolic CHF (congestive heart failure) (HCC) 07/13/2022   CKD (chronic kidney disease), stage IV (HCC) 07/09/2022   Corneal melt, 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   Monoclonal gammopathy 03/10/2021   Atypical chest pain 10/22/2020   CKD (chronic kidney disease), stage III (HCC) 10/22/2020   Atrial flutter (HCC) 10/18/2020   Arthritis 07/15/2020   GERD (gastroesophageal reflux disease) 07/15/2020   Non-ischemic cardiomyopathy (HCC) 07/15/2020   HFrEF (heart failure with reduced ejection fraction) (HCC) 07/15/2020   HTN (hypertension) 07/15/2020   Osteoporosis 07/15/2020   Raynaud's disease 05/05/2019   Rheumatoid arthritis involving multiple sites with positive rheumatoid factor (HCC) 10/28/2015   Mitral regurgitation 05/30/2014   Vitamin D deficiency 05/15/2013   Encounter for long-term (current) use of other medications 12/14/2008  Sjogren's syndrome (HCC) 12/14/2008    Past Medical History:  Diagnosis Date   Acid reflux 07/15/2020   Aortic insufficiency    Arthritis 07/15/2020   Atrial flutter (HCC)    Colitis 07/15/2020   Dilated cardiomyopathy (HCC) 07/15/2020   HFrEF (heart failure with reduced ejection fraction) (HCC) 07/15/2020   HTN (hypertension) 07/15/2020   Mitral regurgitation    Osteoporosis 07/15/2020   PAF (paroxysmal atrial fibrillation) (HCC)     Palpitations 07/15/2020   Rheumatoid arthritis (HCC) 07/15/2020   Tricuspid regurgitation     Family History  Problem Relation Age of Onset   Hypertension Mother    Diabetes Mother    Hypertension Father    Diabetes Father    Breast cancer Maternal Aunt    Breast cancer Paternal Aunt    Arthritis Maternal Grandmother    Lung disease Paternal Grandfather    Cancer Brother    Past Surgical History:  Procedure Laterality Date   CARDIOVERSION N/A 11/08/2020   Procedure: CARDIOVERSION;  Surgeon: Laurey Morale, MD;  Location: [facility] ENDOSCOPY;  Service: Cardiovascular;  Laterality: N/A;   HIP FRACTURE SURGERY     INTRAMEDULLARY (IM) NAIL INTERTROCHANTERIC Left 07/10/2022   Procedure: INTRAMEDULLARY (IM) NAIL INTERTROCHANTERIC;  Surgeon: Tarry Kos, MD;  Location: [facility] OR;  Service: Orthopedics;  Laterality: Left;   RIGHT HEART CATH N/A 08/14/2020   Procedure: RIGHT HEART CATH;  Surgeon: Laurey Morale, MD;  Location: [facility] INVASIVE CV LAB;  Service: Cardiovascular;  Laterality: N/A;   RIGHT HEART CATH N/A 09/03/2022   Procedure: RIGHT HEART CATH;  Surgeon: Laurey Morale, MD;  Location: [facility] INVASIVE CV LAB;  Service: Cardiovascular;  Laterality: N/A;   TEE WITHOUT CARDIOVERSION N/A 11/08/2020   Procedure: TRANSESOPHAGEAL ECHOCARDIOGRAM (TEE);  Surgeon: Laurey Morale, MD;  Location: [facility] ENDOSCOPY;  Service: Cardiovascular;  Laterality: N/A;   Social History   Social History Narrative   Not on file   There is no immunization history for the selected administration types on file for this patient.   Objective: Vital Signs: BP (!) 84/58 (BP Location: Left Arm, Patient Position: Sitting, Cuff Size: Normal)   Pulse 79   Resp 14   Ht 5\' 4"  (1.626 m)   Wt 95 lb (43.1 kg) Comment: patient in a wheelchair  BMI 16.31 kg/m    Physical Exam   Musculoskeletal Exam: ***  wheelchair Left shoulder limited, pain laterally Right shoulder good Right hand nonreducible, left  reducible Myperpigmentation on 2nd-3rd mcps Extension limited,subluxation ***  Investigation: No additional findings.  Imaging: No results found.  Recent Labs: Lab Results  Component Value Date   WBC 7.8 07/16/2023   HGB 12.3 08/31/2023   PLT 198 07/16/2023   NA 138 09/28/2023   K 3.7 09/28/2023   CL 100 09/28/2023   CO2 22 09/28/2023   GLUCOSE 106 (H) 09/28/2023   BUN 104 (H) 09/28/2023   CREATININE 5.08 (H) 09/28/2023   BILITOT 0.7 09/07/2023   ALKPHOS 68 09/07/2023   AST 21 09/07/2023   ALT 29 09/07/2023   PROT 6.5 09/07/2023   ALBUMIN 3.1 (L) 09/07/2023   CALCIUM 9.6 09/28/2023    Speciality Comments: No specialty comments available.  Procedures:  No procedures performed Allergies: Baclofen and Penicillins   Assessment / Plan:     Visit Diagnoses: No diagnosis found.  ***  Orders: No orders of the defined types were placed in this encounter.  No orders of the defined types were placed in this encounter.  Follow-Up Instructions: No follow-ups on file.   Fuller Plan, MD  Note - This record has been created using [employer].  Chart creation errors have been sought, but may not always  have been located. Such creation errors do not reflect on  the standard of medical care.

## 2023-10-10 ENCOUNTER — Other Ambulatory Visit: Payer: Self-pay | Admitting: Nurse Practitioner

## 2023-10-12 DIAGNOSIS — M3500 Sicca syndrome, unspecified: Secondary | ICD-10-CM | POA: Diagnosis not present

## 2023-10-12 DIAGNOSIS — M069 Rheumatoid arthritis, unspecified: Secondary | ICD-10-CM | POA: Diagnosis not present

## 2023-10-12 DIAGNOSIS — H16123 Filamentary keratitis, bilateral: Secondary | ICD-10-CM | POA: Diagnosis not present

## 2023-10-12 DIAGNOSIS — Z961 Presence of intraocular lens: Secondary | ICD-10-CM | POA: Diagnosis not present

## 2023-10-14 ENCOUNTER — Other Ambulatory Visit (HOSPITAL_COMMUNITY): Payer: Self-pay

## 2023-10-14 NOTE — Progress Notes (Signed)
 Paramedicine Encounter    [name], female    DOB: 02-May-1946, 78 y.o.   MRN: [id number]   Complaints-not well, increased sob,   Edema-abd bloating   Compliance with meds-yes   Pill box filled-yes  If so, by whom-pt fills it up   Refills needed-none    Pt reports not feeling well today. She reports she noticed some blood to her stool. After speaking more about this she reports it was a smear when she wiped after having BM. She denies any more occurences but that one time.  She denies straining or anything at that time.   She feels bloated, but seems to always have some bloat and feels a tight band around the top of her stomach.  She reports she does more walking to relieve this and when she burps she does feel better. She feels like it may be more air than fluid. She uses the omeprazole at times.  She is producing good urine.  She has not taken her meds yet today.   She was put on midodrine at the last time she was seen in clinic.  She took it one day and looked up the risks of it and seen that it was not recommended for patients with kidney disease so she only took it one day and that was it.   She has CT scan this month due to her c/o pain and BM right after she eats or takes meds to her PCP so he ordered the test.   She reports he appetite is forced to eat.  She denies dizziness.  She reports a "faint nagging" to her left chest area at times. Last time she experienced that was this morning. She did explain this to provider last time she was seen there. She said it felt like a tingle.   She sees the kidney doc on Monday and will ask about further plan with home dialysis. Dr Shirlee Latch felt it to be too risky for her, and she hopes him and kidney doc will speak on that.      Will f/u next week about her conversation with the kidney doc.    BP 90/60   Pulse 78   Resp 16   Wt 92 lb (41.7 kg)   SpO2 98%   BMI 15.79 kg/m  Weight yesterday-? Last visit  weight-103 @ clinic   Patient Care Team: [name], [name] as PCP - General (General Practice) Burna Sis, LCSW as Social Worker (Licensed Clinical Social Worker) Sharol Given, Pershing Cox, RPH (Inactive) (Pharmacist) Laurey Morale, MD as Consulting Physician (Cardiology)  Patient Active Problem List   Diagnosis Date Noted  . Back muscle spasm 09/22/2023  . Lower abdominal pain 09/22/2023  . Upper back pain 09/22/2023  . Vaginal irritation 07/28/2023  . Acute clinical systolic heart failure (HCC) 07/10/2023  . Acute on chronic systolic CHF (congestive heart failure) (HCC) 07/10/2023  . CHF exacerbation (HCC) 07/09/2023  . Elevated troponin 07/09/2023  . PAF (paroxysmal atrial fibrillation) (HCC) 06/08/2023  . Influenza vaccination declined 06/08/2023  . Herpes zoster vaccination declined 06/08/2023  . COVID-19 vaccination declined 06/08/2023  . Bumps on skin 02/03/2023  . Acquired thrombophilia (HCC) 02/03/2023  . Prediabetes 01/13/2023  . Prolonged QT interval 10/30/2022  . Cataract 09/14/2022  . Uterine prolapse 09/14/2022  . Pressure ulcer 09/03/2022  . Acute heart failure with reduced ejection fraction (HFrEF, <= 40%) (HCC) 08/30/2022  . Anemia of chronic disease 08/30/2022  . Chronic systolic  CHF (congestive heart failure) (HCC) 07/13/2022  . CKD (chronic kidney disease), stage IV (HCC) 07/09/2022  . 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 Monoclonal gammopathy 03/10/2021  . Atypical chest pain 10/22/2020  . CKD (chronic kidney disease), stage III (HCC) 10/22/2020  . Atrial flutter (HCC) 10/18/2020  . Arthritis 07/15/2020  . GERD (gastroesophageal reflux disease) 07/15/2020  . Non-ischemic cardiomyopathy (HCC) 07/15/2020  . HFrEF (heart failure with reduced ejection fraction) (HCC) 07/15/2020  . HTN (hypertension) 07/15/2020  . Osteoporosis 07/15/2020  . Raynaud's disease 05/05/2019  . Rheumatoid arthritis involving multiple sites with positive rheumatoid factor (HCC)  10/28/2015  . Mitral regurgitation 05/30/2014  . Vitamin D deficiency 05/15/2013  . Encounter for long-term (current) use of other medications 12/14/2008  . Sjogren's syndrome (HCC) 12/14/2008    Current Outpatient Medications:  .  acetaminophen (TYLENOL) 500 MG tablet, Take 500 mg by mouth every 6 (six) hours as needed for moderate pain (pain score 4-6)., Disp: , Rfl:  .  amiodarone (PACERONE) 200 MG tablet, Take 1 tablet (200 mg total) by mouth daily. Take 200 mg by mouth once daily, Disp: , Rfl:  .  calcitRIOL (ROCALTROL) 0.25 MCG capsule, Take 2 capsules by mouth daily., Disp: , Rfl:  .  ELIQUIS 2.5 MG TABS tablet, TAKE 1 TABLET BY MOUTH TWICE A DAY, Disp: 60 tablet, Rfl: 5 .  Ensure (ENSURE), Take 237 mLs by mouth every other day., Disp: , Rfl:  .  erythromycin ophthalmic ointment, SMARTSIG:In Eye(s), Disp: , Rfl:  .  folic acid (FOLVITE) 1 MG tablet, TAKE 1 TABLET BY MOUTH EVERY DAY, Disp: 30 tablet, Rfl: 11 .  hydroxypropyl methylcellulose / hypromellose (ISOPTO TEARS / GONIOVISC) 2.5 % ophthalmic solution, Place 1 drop into both eyes as needed for dry eyes., Disp: , Rfl:  .  Multiple Vitamin (MULTIVITAMIN) tablet, Take 1 tablet by mouth 2 (two) times a week., Disp: , Rfl:  .  omeprazole (PRILOSEC) 40 MG capsule, TAKE 1 CAPSULE (40 MG TOTAL) BY MOUTH DAILY AS NEEDED (FOR ACID REFLUX)., Disp: 90 capsule, Rfl: 1 .  potassium chloride (KLOR-CON M) 10 MEQ tablet, Take 2 tablets (20 mEq total) by mouth daily., Disp: 60 tablet, Rfl: 11 .  predniSONE (DELTASONE) 5 MG tablet, Take 1 tablet (5 mg total) by mouth daily with breakfast., Disp: 90 tablet, Rfl: 1 .  sodium bicarbonate 650 MG tablet, Take 650 mg by mouth 2 (two) times daily., Disp: , Rfl:  .  torsemide (DEMADEX) 20 MG tablet, Take 3 tablets (60 mg total) by mouth in the morning AND 2 tablets (40 mg total) every evening., Disp: 200 tablet, Rfl: 3 .  FUROSCIX 80 MG/10ML CTKT, Inject into the skin. (Patient not taking: Reported on  10/14/2023), Disp: , Rfl:  .  methocarbamol (ROBAXIN) 500 MG tablet, Take 1 tablet (500 mg total) by mouth 2 (two) times daily as needed for muscle spasms. (Patient not taking: Reported on 10/14/2023), Disp: 30 tablet, Rfl: 0 .  midodrine (PROAMATINE) 2.5 MG tablet, Take 1 tablet (2.5 mg total) by mouth 3 (three) times daily with meals. (Patient not taking: Reported on 10/14/2023), Disp: 100 tablet, Rfl: 3 Allergies  Allergen Reactions  . Baclofen Nausea And Vomiting  . Penicillins Rash      Social History   Socioeconomic History  . Marital status: Widowed    Spouse name: Not on file  . Number of children: Not on file  . Years of education: 47  . Highest education level: Bachelor's degree (e.g., BA,  AB, BS)  Occupational History  . Occupation: Retired  Tobacco Use  . Smoking status: Never    Passive exposure: Past  . Smokeless tobacco: Never  Vaping Use  . Vaping status: Never Used  Substance and Sexual Activity  . Alcohol use: Never  . Drug use: Never  . Sexual activity: Not Currently  Other Topics Concern  . Not on file  Social History Narrative  . Not on file   Social Drivers of Health   Financial Resource Strain: Low Risk  (06/03/2023)   Overall Financial Resource Strain (CARDIA)   . Difficulty of Paying Living Expenses: Not hard at all  Food Insecurity: No Food Insecurity (07/19/2023)   Hunger Vital Sign   . Worried About Programme researcher, broadcasting/film/video in the Last Year: Never true   . Ran Out of Food in the Last Year: Never true  Transportation Needs: Unmet Transportation Needs (07/19/2023)   PRAPARE - Transportation   . Lack of Transportation (Medical): Yes   . Lack of Transportation (Non-Medical): No  Physical Activity: Inactive (06/03/2023)   Exercise Vital Sign   . Days of Exercise per Week: 0 days   . Minutes of Exercise per Session: 0 min  Stress: No Stress Concern Present (06/03/2023)   [employer] of Occupational Health - Occupational Stress Questionnaire   .  Feeling of Stress : Only a little  Social Connections: Moderately Isolated (06/03/2023)   Social Connection and Isolation Panel [NHANES]   . Frequency of Communication with Friends and Family: More than three times a week   . Frequency of Social Gatherings with Friends and Family: Never   . Attends Religious Services: More than 4 times per year   . Active Member of Clubs or Organizations: No   . Attends Banker Meetings: Never   . Marital Status: Widowed  Intimate Partner Violence: Not At Risk (07/19/2023)   Humiliation, Afraid, Rape, and Kick questionnaire   . Fear of Current or Ex-Partner: No   . Emotionally Abused: No   . Physically Abused: No   . Sexually Abused: No    Physical Exam      Future Appointments  Date Time Provider Department Center  10/19/2023 11:30 AM Little, Karma Lew, RN [facility] None  10/26/2023 10:40 AM [facility] 2 [facility] [facility]  11/02/2023 11:00 AM [facility] PA/NP [facility] None  11/03/2023  9:00 AM Helane Gunther, DPM [facility]  11/29/2023 11:00 AM [name], [name] [facility] None  04/04/2024 11:00 AM Dimple Casey, Jamesetta Orleans, MD [facility] None       Kerry Hough, Paramedic [phone] [facility] Paramedic  10/14/23

## 2023-10-18 DIAGNOSIS — M810 Age-related osteoporosis without current pathological fracture: Secondary | ICD-10-CM | POA: Diagnosis not present

## 2023-10-18 DIAGNOSIS — M1000 Idiopathic gout, unspecified site: Secondary | ICD-10-CM | POA: Diagnosis not present

## 2023-10-18 DIAGNOSIS — M069 Rheumatoid arthritis, unspecified: Secondary | ICD-10-CM | POA: Diagnosis not present

## 2023-10-18 DIAGNOSIS — N2581 Secondary hyperparathyroidism of renal origin: Secondary | ICD-10-CM | POA: Diagnosis not present

## 2023-10-18 DIAGNOSIS — D631 Anemia in chronic kidney disease: Secondary | ICD-10-CM | POA: Diagnosis not present

## 2023-10-18 DIAGNOSIS — I428 Other cardiomyopathies: Secondary | ICD-10-CM | POA: Diagnosis not present

## 2023-10-18 DIAGNOSIS — N185 Chronic kidney disease, stage 5: Secondary | ICD-10-CM | POA: Diagnosis not present

## 2023-10-19 ENCOUNTER — Ambulatory Visit: Payer: Self-pay

## 2023-10-19 DIAGNOSIS — I5032 Chronic diastolic (congestive) heart failure: Secondary | ICD-10-CM

## 2023-10-19 DIAGNOSIS — N184 Chronic kidney disease, stage 4 (severe): Secondary | ICD-10-CM

## 2023-10-19 LAB — LAB REPORT - SCANNED: EGFR: 8

## 2023-10-19 NOTE — Patient Outreach (Addendum)
 Care Coordination   Initial Visit Note   10/19/2023 Name: Tracy Nelson MRN: [id number] DOB: 08-10-1946  Tracy Nelson is a 78 y.o. year old female who sees [name], [name] for primary care. I spoke with  [name] by phone today.  What matters to the patients health and wellness today?  Patient would like to learn more about Peritoneal dialysis.     Goals Addressed             This Visit's Progress    To improve Self-Health management of CHF       Care Coordination Interventions: Basic overview and discussion of pathophysiology of Heart Failure reviewed Provided education on low sodium diet Reviewed Heart Failure Action Plan in depth and provided written copy Assessed need for readable accurate scales in home Provided education about placing scale on hard, flat surface Advised patient to weigh each morning after emptying bladder Discussed importance of daily weight and advised patient to weigh and record daily Reviewed role of diuretics in prevention of fluid overload and management of heart failure; Discussed the importance of keeping all appointments with provider       To start peritoneal dialysis       Care Coordination Interventions: Assessed the Patient understanding of chronic kidney disease    Evaluation of current treatment plan related to chronic kidney disease self management and patient's adherence to plan as established by provider      Reviewed prescribed diet renal/low Sodium/follow MD recommendations for fluid restriction Reviewed medications with patient and discussed importance of compliance    Discussed the impact of chronic kidney disease on daily life and mental health and acknowledged and normalized feelings of disempowerment, fear, and frustration    Educated patient and answered questions related to Peritoneal dialysis Mailed printed educational materials related to Peritoneal dialysis  Last practice recorded BP readings:  BP Readings  from Last 3 Encounters:  10/14/23 90/60  10/06/23 (!) 84/58  09/28/23 94/78   Most recent eGFR/CrCl:  Lab Results  Component Value Date   EGFR 8 09/28/2023    No components found for: "CRCL"        Interventions Today    Flowsheet Row Most Recent Value  Chronic Disease   Chronic disease during today's visit Congestive Heart Failure (CHF), Chronic Kidney Disease/End Stage Renal Disease (ESRD)  General Interventions   General Interventions Discussed/Reviewed General Interventions Discussed, General Interventions Reviewed, Labs, Doctor Visits  Doctor Visits Discussed/Reviewed PCP, Specialist, Doctor Visits Discussed, Doctor Visits Reviewed  Education Interventions   Education Provided Provided Education  Provided Verbal Education On Nutrition, Medication, Labs, When to see the doctor  Labs Reviewed Kidney Function  Nutrition Interventions   Nutrition Discussed/Reviewed Nutrition Discussed, Nutrition Reviewed, Fluid intake  Pharmacy Interventions   Pharmacy Dicussed/Reviewed Pharmacy Topics Reviewed, Pharmacy Topics Discussed, Medications and their functions          SDOH assessments and interventions completed:  Yes  SDOH Interventions Today    Flowsheet Row Most Recent Value  SDOH Interventions   Food Insecurity Interventions Intervention Not Indicated  Housing Interventions Intervention Not Indicated  Transportation Interventions AMB Referral  Utilities Interventions Intervention Not Indicated        Care Coordination Interventions:  Yes, provided   Follow up plan: Referral made to [id number] SDOH to assist with resources for transportation  Follow up call scheduled for 11/16/23 @11 :30 AM    Encounter Outcome:  Patient Visit Completed

## 2023-10-19 NOTE — Patient Instructions (Signed)
 Visit Information  Thank you for taking time to visit with me today. Please don't hesitate to contact me if I can be of assistance to you.   Following are the goals we discussed today:   Goals Addressed             This Visit's Progress    To improve Self-Health management of CHF       Care Coordination Interventions: Basic overview and discussion of pathophysiology of Heart Failure reviewed Provided education on low sodium diet Reviewed Heart Failure Action Plan in depth and provided written copy Assessed need for readable accurate scales in home Provided education about placing scale on hard, flat surface Advised patient to weigh each morning after emptying bladder Discussed importance of daily weight and advised patient to weigh and record daily Reviewed role of diuretics in prevention of fluid overload and management of heart failure; Discussed the importance of keeping all appointments with provider       To start peritoneal dialysis       Care Coordination Interventions: Assessed the Patient understanding of chronic kidney disease    Evaluation of current treatment plan related to chronic kidney disease self management and patient's adherence to plan as established by provider      Reviewed prescribed diet renal/low Sodium/follow MD recommendations for fluid restriction Reviewed medications with patient and discussed importance of compliance    Discussed the impact of chronic kidney disease on daily life and mental health and acknowledged and normalized feelings of disempowerment, fear, and frustration    Educated patient and answered questions related to Peritoneal dialysis Mailed printed educational materials related to Peritoneal dialysis  Last practice recorded BP readings:  BP Readings from Last 3 Encounters:  10/14/23 90/60  10/06/23 (!) 84/58  09/28/23 94/78   Most recent eGFR/CrCl:  Lab Results  Component Value Date   EGFR 8 09/28/2023    No components found  for: "CRCL"            Our next appointment is by telephone on 11/16/23 at 11:30 AM  Please call the care guide team at [phone] if you need to cancel or reschedule your appointment.   If you are experiencing a Mental Health or Behavioral Health Crisis or need someone to talk to, please call 1-800-273-TALK (toll free, 24 hour hotline)  The patient verbalized understanding of instructions, educational materials, and care plan provided today and DECLINED offer to receive copy of patient instructions, educational materials, and care plan.   Delsa Sale RN BSN CCM [place]  [facility], [facility] Nurse Care Coordinator  Direct Dial: [phone] Website: [email or web address]

## 2023-10-19 NOTE — Addendum Note (Signed)
 Addended by: Riley Churches on: 10/19/2023 04:50 PM   Modules accepted: Orders

## 2023-10-19 NOTE — Addendum Note (Signed)
 Addended by: Riley Churches on: 10/19/2023 03:56 PM   Modules accepted: Orders

## 2023-10-20 ENCOUNTER — Telehealth: Payer: Self-pay

## 2023-10-20 ENCOUNTER — Telehealth: Payer: Self-pay | Admitting: Pharmacist

## 2023-10-20 DIAGNOSIS — I4892 Unspecified atrial flutter: Secondary | ICD-10-CM

## 2023-10-20 NOTE — Progress Notes (Signed)
 10/20/2023 Name: Tracy Nelson MRN: [id number] DOB: 1945/11/25  Chief Complaint  Patient presents with   Medication Management   Medication Assistance    Eliquis    Tracy Nelson is a 78 y.o. year old female who presented for a telephone visit.   They were referred to the pharmacist by their PCP for assistance in managing medication access.    Subjective:  Care Team: Primary Care Provider: [name], [name] ; Next Scheduled Visit: 11/29/23  Medication Access/Adherence  Current Pharmacy:  [facility] [address], [place] - [address] [place] [address] Phone: [phone] Fax: [phone]  [place] [facility] [address] [place] [address] Phone: [phone] Fax: [phone]  [facility] [address] [place] [address] Phone: [phone] Fax: [phone]   Patient reports affordability concerns with their medications: Yes  Eliquis Patient reports access/transportation concerns to their pharmacy: No  Patient reports adherence concerns with their medications:  No      Patient meets the financial criteria for Eliquis through [employer] patient assistance program but she has not me the 3% of household income in medication expenses requirement yet.  That would be about $1300 from rough calculations.    Objective:  Lab Results  Component Value Date   HGBA1C 5.0 01/19/2023    Lab Results  Component Value Date   CREATININE 5.08 (H) 09/28/2023   BUN 104 (H) 09/28/2023   NA 138 09/28/2023   K 3.7 09/28/2023   CL 100 09/28/2023   CO2 22 09/28/2023    Lab Results  Component Value Date   CHOL 205 (H) 03/09/2022   HDL 91 03/09/2022   LDLCALC 95 03/09/2022   TRIG 111 03/09/2022   CHOLHDL 2.3 03/09/2022    Medications Reviewed Today     Reviewed by Beecher Mcardle, RPH (Pharmacist) on 10/20/23 at 1614  Med List Status: <None>    Medication Order Taking? Sig Documenting Provider Last Dose Status Informant  acetaminophen (TYLENOL) 500 MG tablet [id number] Yes Take 500 mg by mouth every 6 (six) hours as needed for moderate pain (pain score 4-6). [provider] Taking Active Self, Pharmacy Records  amiodarone (PACERONE) 200 MG tablet [id number] Yes Take 1 tablet (200 mg total) by mouth daily. Take 200 mg by mouth once daily Zannie Cove, MD Taking Active   calcitRIOL (ROCALTROL) 0.25 MCG capsule [id number] Yes Take 2 capsules by mouth daily. [provider] Taking Active Self, Pharmacy Records  ELIQUIS 2.5 MG TABS tablet [id number] Yes TAKE 1 TABLET BY MOUTH TWICE A DAY Laurey Morale, MD Taking Active   Ensure [facility]) [id number] Yes Take 237 mLs by mouth every other day. [provider] Taking Active Self, Pharmacy Records  erythromycin ophthalmic ointment [id number] Yes SMARTSIG:In Eye(s) [provider] Taking Active            Med Note Grayce Sessions Oct 20, 2023  4:12 PM) PRN  folic acid (FOLVITE) 1 MG tablet [id number] Yes TAKE 1 TABLET BY MOUTH EVERY DAY Laurey Morale, MD Taking Active   FUROSCIX 80 MG/10ML CTKT [id number] No Inject into the skin.  Patient not taking: Reported on 10/20/2023   [provider] Not Taking Active   hydroxypropyl methylcellulose / hypromellose (ISOPTO TEARS / GONIOVISC) 2.5 % ophthalmic solution [id number] Yes Place 1 drop into both eyes as needed for dry eyes. [provider] Taking Active Self,  Pharmacy Records  methocarbamol (ROBAXIN) 500 MG tablet [id number] Yes Take 1 tablet (500 mg total) by mouth 2 (two) times daily as needed for muscle spasms. [name], NP Taking Active            Med Note Leavy Cella, Romilda Joy   Wed Oct 20, 2023  4:13 PM) PRN only  midodrine (PROAMATINE) 2.5 MG tablet [id number] Yes Take 1 tablet (2.5 mg total) by mouth 3 (three) times daily with meals. [place], [name], [name] Taking Active   moxifloxacin  (VIGAMOX) 0.5 % ophthalmic solution [id number] Yes Place 1 drop into the left eye 3 (three) times daily. [provider] Taking Active   Multiple Vitamin (MULTIVITAMIN) tablet [id number] Yes Take 1 tablet by mouth 2 (two) times a week. [provider] Taking Active Self, Pharmacy Records  omeprazole (PRILOSEC) 40 MG capsule [id number] Yes TAKE 1 CAPSULE (40 MG TOTAL) BY MOUTH DAILY AS NEEDED (FOR ACID REFLUX). [name], [name] Taking Active   potassium chloride (KLOR-CON M) 10 MEQ tablet [id number] Yes Take 2 tablets (20 mEq total) by mouth daily. [place], [name], [name] Taking Active   predniSONE (DELTASONE) 5 MG tablet [id number] Yes Take 1 tablet (5 mg total) by mouth daily with breakfast. Fuller Plan, MD Taking Active Self, Pharmacy Records  sodium bicarbonate 650 MG tablet [id number] Yes Take 650 mg by mouth 2 (two) times daily. [provider] Taking Active Self, Pharmacy Records  torsemide (DEMADEX) 20 MG tablet [id number] Yes Take 3 tablets (60 mg total) by mouth in the morning AND 2 tablets (40 mg total) every evening. Laurey Morale, MD Taking Active               Assessment/Plan:     Patient was instructed to look at her EOBs from [facility] as well as to request periodic print outs from her Pharmacy to keep watch of her medication expenses.  She was instructed to call me when she gets close to $1300.  Patient said she spent over $1300 last year.   Follow Up Plan:    Follow up with the patient in late July to see where her spending is at that time.  Beecher Mcardle, PharmD, BCACP Clinical Pharmacist [phone]

## 2023-10-20 NOTE — Progress Notes (Signed)
 Complex Care Management Note Care Guide Note  10/20/2023 Name: [name] MRN: [id number] DOB: Jul 11, 1946  Tracy Nelson is a 78 y.o. year old female who is a primary care patient of [name], [name] . The community resource team was consulted for assistance with Transportation Needs  and Food Insecurity  SDOH screenings and interventions completed:  Yes  Social Drivers of Health From This Encounter   Food Insecurity: No Food Insecurity (10/20/2023)   Hunger Vital Sign    Worried About Running Out of Food in the Last Year: Never true    Ran Out of Food in the Last Year: Never true  Housing: Low Risk  (10/20/2023)   Housing Stability Vital Sign    Unable to Pay for Housing in the Last Year: No    Number of Times Moved in the Last Year: 0    Homeless in the Last Year: No  Financial Resource Strain: Low Risk  (10/20/2023)   Overall Financial Resource Strain (CARDIA)    Difficulty of Paying Living Expenses: Not hard at all  Transportation Needs: Unmet Transportation Needs (10/20/2023)   PRAPARE - Administrator, Civil Service (Medical): Yes    Lack of Transportation (Non-Medical): Yes  Utilities: Not At Risk (10/20/2023)   Utilities    Threatened with loss of utilities: No    SDOH Interventions Today    Flowsheet Row Most Recent Value  SDOH Interventions   Food Insecurity Interventions Other (Comment)  [Emailed referral for Meals on Wheels to [employer] [facility] [employer]. Emailed referral for Meals on Wheels to [employer] [facility] [employer]. Informed patient the wait list is several months.]  Transportation Interventions Other (Comment), [employer] ([employer])  [Spoke with patient to complete online Part A [employer] transportation application. Faxed Part B to patient's PCP [name], [name] to be completed and faxed to [employer] [place] Eligibility [phone].]        Care guide performed the following  interventions: Spoke with patient to complete online Part A [employer] [place] transportation application. Faxed Part B to patient's PCP [name], [name] to be completed and faxed to [employer] [place] Eligibility [phone].  Received email confirming receipt of Part A application from [facility]. Emailed referral for Meals on Wheels to [employer] [facility] [employer].  Follow Up Plan:  Care guide will follow up with patient by phone over the next 7 days.  Encounter Outcome:  Patient Visit Completed  Pierrette Scheu [facility]  [facility] Guide Direct Dial: [phone]  Fax: [phone] Website: [email or web address]

## 2023-10-22 ENCOUNTER — Telehealth (HOSPITAL_COMMUNITY): Payer: Self-pay | Admitting: Cardiology

## 2023-10-22 NOTE — Telephone Encounter (Signed)
 Patient called to report BLE  Reports swelling is staying in her feet -would like to increase torsemide to 60 BID   Reports edema is minimal however at the end of the day swelling is increased (feet tight) Denies SOB Weight stable in the AM however in the afternoon will increase by 2-3 lbs   Please advise

## 2023-10-25 ENCOUNTER — Telehealth (HOSPITAL_COMMUNITY): Payer: Self-pay

## 2023-10-25 ENCOUNTER — Telehealth: Payer: Self-pay

## 2023-10-25 MED ORDER — TORSEMIDE 20 MG PO TABS: 60.0000 mg | ORAL_TABLET | Freq: Two times a day (BID) | ORAL | 3 refills | Status: DC

## 2023-10-25 NOTE — Telephone Encounter (Signed)
 Reached out to pt to sch home visit this week.  She said to call back Wednesday afternoon to see about Thursday.  I hope my thur is not booked up by then, but will try to leave some space in case she is avail.   Kerry Hough, EMT-Paramedic  [phone] 10/25/2023

## 2023-10-25 NOTE — Telephone Encounter (Signed)
Pt aware.

## 2023-10-25 NOTE — Progress Notes (Signed)
 Complex Care Management Note Care Guide Note  10/25/2023 Name: Tracy Nelson MRN: [id number] DOB: Jun 15, 1946  MATEYA TORTI is a 78 y.o. year old female who is a primary care patient of [name], [name] . The community resource team was consulted for assistance with Transportation Needs  and Food Insecurity  SDOH screenings and interventions completed:  Yes  Social Drivers of Health From This Encounter   Food Insecurity: No Food Insecurity (10/25/2023)   Hunger Vital Sign    Worried About Running Out of Food in the Last Year: Never true    Ran Out of Food in the Last Year: Never true  Transportation Needs: Unmet Transportation Needs (10/25/2023)   PRAPARE - Administrator, Civil Service (Medical): Yes    Lack of Transportation (Non-Medical): Yes    SDOH Interventions Today    Flowsheet Row Most Recent Value  SDOH Interventions   Food Insecurity Interventions Other (Comment)  [Received email confirmation from [employer] [facility] [employer]. Patient has been placed on waitlist.]  Transportation Interventions Other (Comment)  [[employer] [facility] has received online Part A application. Once they receive Part B application from PCP the application will be reviewed and the patient will be contacted.]        Care guide performed the following interventions: Called patient to inform her Meals on Wheels has placed her on their waitlist.  [employer] [facility] has received online Part A application. Once they receive Part B application from PCP the application will be reviewed and the patient will be contacted.    Follow Up Plan:  No further follow up planned at this time. The patient has been provided with needed resources.  Encounter Outcome:  Patient Visit Completed  Annaleah Arata [facility]  [facility] Guide Direct Dial: [phone]  Fax: [phone] Website: [email or web address]

## 2023-10-26 ENCOUNTER — Ambulatory Visit
Admission: RE | Admit: 2023-10-26 | Discharge: 2023-10-26 | Disposition: A | Discharge status: Home / Self Care | Payer: Medicare PPO | Source: Ambulatory Visit | Attending: Family Medicine | Admitting: Family Medicine

## 2023-10-26 DIAGNOSIS — D259 Leiomyoma of uterus, unspecified: Secondary | ICD-10-CM | POA: Diagnosis not present

## 2023-10-26 DIAGNOSIS — R109 Unspecified abdominal pain: Secondary | ICD-10-CM | POA: Diagnosis not present

## 2023-10-26 DIAGNOSIS — I700 Atherosclerosis of aorta: Secondary | ICD-10-CM | POA: Diagnosis not present

## 2023-10-26 DIAGNOSIS — R1030 Lower abdominal pain, unspecified: Secondary | ICD-10-CM

## 2023-10-26 DIAGNOSIS — N200 Calculus of kidney: Secondary | ICD-10-CM | POA: Diagnosis not present

## 2023-11-01 ENCOUNTER — Telehealth (HOSPITAL_COMMUNITY): Payer: Self-pay

## 2023-11-01 NOTE — Telephone Encounter (Signed)
 Called to confirm/remind patient of [facility] on [facility]23 11am.   Appointment:   [x] Confirmed   Patient reminded to bring all medications and/or complete list.  Confirmed patient has transportation. Gave directions, instructed to utilize valet parking.

## 2023-11-02 ENCOUNTER — Encounter (HOSPITAL_COMMUNITY): Payer: Self-pay

## 2023-11-02 ENCOUNTER — Ambulatory Visit (HOSPITAL_COMMUNITY)
Admission: RE | Admit: 2023-11-02 | Discharge: 2023-11-02 | Disposition: A | Discharge status: Home / Self Care | Payer: Medicare PPO | Source: Ambulatory Visit | Attending: Family Medicine | Admitting: Family Medicine

## 2023-11-02 ENCOUNTER — Other Ambulatory Visit (HOSPITAL_COMMUNITY): Payer: Self-pay

## 2023-11-02 VITALS — BP 92/68 | HR 78 | Ht 64.0 in | Wt 101.4 lb

## 2023-11-02 DIAGNOSIS — Z7189 Other specified counseling: Secondary | ICD-10-CM | POA: Diagnosis not present

## 2023-11-02 DIAGNOSIS — I4891 Unspecified atrial fibrillation: Secondary | ICD-10-CM | POA: Diagnosis not present

## 2023-11-02 DIAGNOSIS — I5022 Chronic systolic (congestive) heart failure: Secondary | ICD-10-CM | POA: Diagnosis not present

## 2023-11-02 DIAGNOSIS — N186 End stage renal disease: Secondary | ICD-10-CM | POA: Insufficient documentation

## 2023-11-02 DIAGNOSIS — I480 Paroxysmal atrial fibrillation: Secondary | ICD-10-CM

## 2023-11-02 DIAGNOSIS — Z79899 Other long term (current) drug therapy: Secondary | ICD-10-CM | POA: Diagnosis not present

## 2023-11-02 DIAGNOSIS — Z7901 Long term (current) use of anticoagulants: Secondary | ICD-10-CM | POA: Diagnosis not present

## 2023-11-02 DIAGNOSIS — I428 Other cardiomyopathies: Secondary | ICD-10-CM | POA: Diagnosis not present

## 2023-11-02 DIAGNOSIS — D631 Anemia in chronic kidney disease: Secondary | ICD-10-CM

## 2023-11-02 DIAGNOSIS — M069 Rheumatoid arthritis, unspecified: Secondary | ICD-10-CM | POA: Diagnosis not present

## 2023-11-02 DIAGNOSIS — N184 Chronic kidney disease, stage 4 (severe): Secondary | ICD-10-CM | POA: Diagnosis not present

## 2023-11-02 DIAGNOSIS — I484 Atypical atrial flutter: Secondary | ICD-10-CM | POA: Insufficient documentation

## 2023-11-02 DIAGNOSIS — R0602 Shortness of breath: Secondary | ICD-10-CM | POA: Diagnosis present

## 2023-11-02 NOTE — Progress Notes (Signed)
 Advanced Heart Failure Clinic Note   PCP: [name], [name] Nephrology: [name] HF Cardiologist: [name]  Chief Complaint: Shortness of breath  HPI: 78 y.o. with history of rheumatoid arthritis, CKD stage IV, and nonischemic cardiomyopathy was referred by [name] in [place] to establish heart failure care in [place].  Patient has been known to have a cardiomyopathy since 2015.  Cardiac MRI in 2015 showed EF 37% with no LGE.  Coronary angiography at that time showed no significant coronary disease.  Over the next few years, LV EF fluctuated up and down.  In 3/21, echo showed EF down to 20-25%.  She was admitted to the hospital in [place], [place] in 11/21 with CHF exacerbation.  Echo showed EF 20-25%, global hypokinesis, mildly decreased RV systolic function.   She was admitted in 3/22 with atrial fibrillation, converted to NSR on amiodarone. She went into atrial flutter after this and had TEE-DCCV in 4/22.  TEE showed EF < 20%, moderate LV dilation, mild RV dilation with moderate RV dysfunction, moderate central MR.  CPX in 3/22 showed severe HF limitation.  PYP scan 3/23 and likely negative.   She was evaluated for [facility] or ANTHEM, BNP too high and thought to be too frail.   Cardiac MRI in 10/22 showed mild LV dilation with EF 23%, normal RV size with RVEF 37%, ECV 34%, small area of subendocardial LGE in the mid inferolateral wall and small area of full thickness LGE in the basal  inferior wall.   Admitted 07/08/22 after mechanical fall resulting in left hip fracture. S/P left intramedullary nail 07/10/22.  Post op developed AKI. Echo showed EF improved to 45%.   Admitted in 1/24 with acute on chronic systolic CHF and AKI. She required milrinone, RHC showed CI 2.81 on milrinone 0.25.  Echo showed EF < 20%.  Suspected end stage HF.   Echo 3/24 EF 20-25%, mild LV dilation, severely decreased RV systolic function, mild RV enlargement, severe biatrial enlargement,  moderate-severe TR, moderate MR  She was admitted 3/13-3/24/24 w/ symptomatic anemia and dyspepsia. Hgb 6.8,  transfused 2 uRBCs. GI recommended conservative management. There was no overt blood loss-FOBT was negative-gastroenterology did not feel endoscopic evaluation was warranted. She was started on Protonix. Hospital course was c/b by volume overload.   Acute visit 05/27/23, mildly volume overloaded and in atrial fibrillation with RVR. Amiodarone increased to 200 mg bid x 1 week. She ultimately chemically converted to NSR and TEE/DCCV aborted.  She was admitted 07/09/23 with acute on chronic CHF and AKI on CKD. Diuresed with IV lasix. Scr bumped to 4.2, improved to 4.0 after diuretic held a couple of days. Palliative care consulted for GOC discussions d/t end-stage HF. She was not ready to consider hospice.  Today she returns for HF follow up. Overall feeling fair. Main complaints are poor sleep and restless legs. She is not SOB with ADLs, generally not very active but able to get around the house.  Denies palpitations, abnormal bleeding, CP, dizziness, edema, or PND/Orthopnea. Appetite fair. No fever or chills. Weight at home 101 pounds. Taking all medications, not taking midodrine. She is doing PD classes in preparation for peritoneal dialysis.  ReDs reading: 31 %, normal  Labs (12/24): K 3.8, creatinine 4.03 Labs (08/19/23): BNP 2990, K 4.1, creatinine 4.29, BUN 105 Labs (09/07/23): BNP 3754, K 4, Scr 4.05, BUN 92 Labs (10/18/23): K 4.2, creatinine 5.21  ECG (personally reviewed): none ordered today.  PMH: 1. GERD 2. Rheumatoid arthritis: Followed by  rheumatology in [place].  - No known pulmonary disease from RA.  3. Osteoporosis. 4. Gout 5. CKD stage 4 6. H/o SVT 7. Chronic systolic CHF: She was found to have nonischemic cardiomyopathy with low EF in 2015.  - LHC (2015): Normal coronaries.  - cardiac MRI (2015): EF 37%, no LGE - Echo (3/21): EF 20-25%. - Echo (11/21): EF 20-25%,  moderate LV dilation, global hypokinesis, biatrial enlargement, mildly decreased RV systolic function.  - Echo (12/21): EF < 20%, moderate LV dilation, normal RV size/systolic function, moderate MR, IVC normal.  - RHC (1/22): Mean RA 1, PA 39/14, mean PCWP 10, PVR 3.9 WU, CI 2.22.  - CPX (3/22): peak VO2 10.1, VE/VCO2 55, RER 1.13.  Severe functional limitation due to HF.  - TEE (4/22): EF < 20%, moderate LV dilation, mild RV dilation with moderate RV dysfunction, moderate central MR. - PYP scan (4/22): grade 1 with H/CL 1.48. Equivocal. [employer]ae gene testing negative. - Cardiac MRI (10/22): mild LV dilation with EF 23%, normal RV size with RVEF 37%, ECV 34%, small area of subendocardial LGE in the mid inferolateral wall and small area of full thickness LGE in the basal inferior wall.  - PYP scan (3/23): grade 1, H/CL 1.3.  Probably negative.  - Echo 07/11/22: EF 45-50% RV normal  - Echo (3/24): EF 20-25%, mild LV dilation, severely decreased RV systolic function, mild RV enlargement, severe biatrial enlargement, moderate-severe TR, moderate MR, IVC normal.  8. Atrial fibrillation/flutter: TEE-DCCV in 4/22.  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  10. Varicella zoster with post-herpetic neuralgia 11. L hip fracture , mechanical fall.  12. Anemia  SH: From [place] but moved to [place] to live with daughter in 2021.  Has 3 children.  Nonsmoker.  No ETOH.   FH: Mother with CHF, father with MI.   ROS: All systems reviewed and negative except as per HPI.   Current Outpatient Medications  Medication Sig Dispense Refill   acetaminophen (TYLENOL) 500 MG tablet Take 500 mg by mouth every 6 (six) hours as needed for moderate pain (pain score 4-6).     amiodarone (PACERONE) 200 MG tablet Take 1 tablet (200 mg total) by mouth daily. Take 200 mg by mouth once daily (Patient taking differently: Take 200 mg by mouth daily. Take 200 mg by mouth once daily  Taking 1/2 tablet daily  per patient)     calcitRIOL (ROCALTROL) 0.25 MCG capsule Take 2 capsules by mouth daily.     ELIQUIS 2.5 MG TABS tablet TAKE 1 TABLET BY MOUTH TWICE A DAY 60 tablet 5   Ensure (ENSURE) Take 237 mLs by mouth every other day.     erythromycin ophthalmic ointment SMARTSIG:In Eye(s)     folic acid (FOLVITE) 1 MG tablet TAKE 1 TABLET BY MOUTH EVERY DAY 30 tablet 11   hydroxypropyl methylcellulose / hypromellose (ISOPTO TEARS / GONIOVISC) 2.5 % ophthalmic solution Place 1 drop into both eyes as needed for dry eyes.     methocarbamol (ROBAXIN) 500 MG tablet Take 1 tablet (500 mg total) by mouth 2 (two) times daily as needed for muscle spasms. 30 tablet 0   moxifloxacin (VIGAMOX) 0.5 % ophthalmic solution Place 1 drop into the left eye 3 (three) times daily.     Multiple Vitamin (MULTIVITAMIN) tablet Take 1 tablet by mouth 2 (two) times a week.     omeprazole (PRILOSEC) 40 MG capsule TAKE 1 CAPSULE (40 MG TOTAL) BY MOUTH DAILY AS NEEDED (FOR  ACID REFLUX). 90 capsule 1   potassium chloride (KLOR-CON M) 10 MEQ tablet Take 2 tablets (20 mEq total) by mouth daily. 60 tablet 11   predniSONE (DELTASONE) 5 MG tablet Take 1 tablet (5 mg total) by mouth daily with breakfast. 90 tablet 1   sodium bicarbonate 650 MG tablet Take 650 mg by mouth 2 (two) times daily.     torsemide (DEMADEX) 20 MG tablet Take 3 tablets (60 mg total) by mouth 2 (two) times daily. (Patient taking differently: Take 60 mg by mouth 2 (two) times daily. Taking 60 mg am and 40 mg pm) 180 tablet 3   FUROSCIX 80 MG/10ML CTKT Inject into the skin. (Patient not taking: Reported on 10/14/2023)     midodrine (PROAMATINE) 2.5 MG tablet Take 1 tablet (2.5 mg total) by mouth 3 (three) times daily with meals. 100 tablet 3   No current facility-administered medications for this encounter.   Wt Readings from Last 3 Encounters:  11/02/23 46 kg (101 lb 6.4 oz)  10/14/23 41.7 kg (92 lb)  10/06/23 43.1 kg (95 lb)   BP 92/68   Pulse 78   Ht 5\' 4"  (1.626  m)   Wt 46 kg (101 lb 6.4 oz)   SpO2 99%   BMI 17.41 kg/m   PHYSICAL EXAM: General:  NAD. No resp difficulty, arrived in [facility], elderly, chronically-ill appearing. HEENT: Normal Neck: Supple. No JVD. Cor: Regular rate & rhythm. No rubs, gallops or murmurs. Lungs: Clear Abdomen: Soft, nontender, nondistended.  Extremities: No cyanosis, clubbing, rash, edema Neuro: Alert & oriented x 3, moves all 4 extremities w/o difficulty. Affect pleasant.  Assessment/Plan: 1. Chronic Systolic CHF: Nonischemic cardiomyopathy.  This has been known since 2015, cath in 2015 showed no coronary disease and cardiac MRI in 2015 showed no LGE.  Cause is uncertain, familial cardiomyopathy is a concern given nonischemic cardiomyopathy in her mother.  Cannot rule out remote viral myocarditis. RHC (1/22) showed low filling pressures and actually a relatively preserved cardiac index of 2.22.  PYP scan in 3/23 likely negative.  Cardiac MRI in 10/22 showed mild LV dilation with EF 23%, normal RV size with RVEF 37%, ECV 34%, small area of subendocardial LGE in the mid inferolateral wall and small area of full thickness LGE in the basal inferior wall.   This was not suggestive of cardiac amyloidosis but possibly could be consistent with coronary embolism (though no LV thrombus visualized) versus prior myocarditis versus sarcoidosis.  Inv[employer]ae gene testing for TTR mutations was negative. Cardiac MRI not consistent with amyloidosis.  She has MGUS with monoclonal IgG paraprotein, but based on cMRI and slow progression, think AL amyloidosis is unlikely.  Most recent echo 3/24 with EF 20-25%, mild LV dilation, severely decreased RV systolic function, mild RV enlargement, severe biatrial enlargement, moderate-severe TR, moderate MR. She was admitted in 1/24 and required milrinone. Chronically NYHA class IV.  GDMT severely limited by low BP and CKD IV. 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 Appears mostly euvolemic on exam.  - Continue midodrine  2.5 mg tid. - Continue torsemide 60 qam/40 qpm. Labs reviewed from nephrology visit 10/18/23, K 4.2, SCr 5.21 - Does not have an ICD, would not place with end stage HF.   - Continue compression hose. 2. 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  She is followed by [name]. 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 She would not do well with dialysis from a heart standpoint. She would be better served with hospice services. Discussed with [name] again today.  However, PD was offered to her by her nephrologist, and if this is consistent with her wishes/values, she does not need our permission to pursue this. We discussed this again today in detail. She understands that wether or not to pursue PD is ultimately her decision. - On midodrine. 3. Rheumatoid arthritis: No history of lung involvement. She has been on a low dose of prednisone chronically. Now off Imuran, felt to be contributing to anemia.  - She follows with Dr. Dimple Casey. 4. Atrial fibrillation/flutter: S/p TEE-DCCV in 4/22. She is now on amiodarone.  She was in atypical atrial flutter 10/24. She does not tolerate atrial arrhythmias. NSR today on ECG. - Continue amiodarone 200 mg daily.  LFTs and TSH 09/07/23 normal. Will need regular eye exam.  - Continue Eliquis 5 mg bid. No bleeding issues. 5. Anemia: She is receiving iron infusions per nephrology. 6. GOC: Seen by Palliative Care during recent admit and referred for outpatient Palliative. Not ready for hospice. Remains Full Code. She is gradually declining.  - We had a long talk today regarding trial of PD vs hospice. She is clear that she is not ready for hospice but appears hesitant to pursue PD without Dr. Alford Highland explicit blessing. I discussed concerns from a HF standpoint, but ultimately this is her decision.  Follow up in 3 months with [name].  Prince Rome, [name]-BC 11/02/23

## 2023-11-02 NOTE — Progress Notes (Signed)
 Paramedicine Encounter   [name] , female,   DOB: 1945-11-12,78 y.o.,  MRN: [id number]   Met patient in clinic today with provider.  Weight @ clinic-101 B/P-92/68 P-78 [id number]  Weights at home-95-96 Med changes-none   Pt had to call clinic last week due to weight gain. Her torsemide was increased 60mg  BID. She reports good urine output since that change and feels better.  Just tired. She does not sleep much at night. Sounds like she stays up watching tv and on phone. So we talked about cutting those items off before bed and also to f/u with PCP regarding this also.   She went to see kidney doc last week. Kidney doc offering dialysis still and she wants to proceed. She will f/u with kidney in a couple wks about that. At visit last week, she still was not sure what she wanted.   Off and on c/p at random times.   She c/o leg trouble which she has been told it was RLS.  Deferred to PCP for further.  No changes today.   Kerry Hough, EMT-Paramedic [phone] 11/02/2023

## 2023-11-02 NOTE — Patient Instructions (Signed)
 No change in medications. Please follow up with your primary care physician about your restless leg syndrome and sleep issues. Return to see [name] in 3 months. Please call us at [phone] to schedule this appointment in May to schedule this appointment. Please call us at [phone] option 2 if you have any questions or concerns prior to your next visit.

## 2023-11-03 ENCOUNTER — Ambulatory Visit: Payer: Medicare PPO | Admitting: Podiatry

## 2023-11-03 ENCOUNTER — Encounter: Payer: Self-pay | Admitting: Podiatry

## 2023-11-03 DIAGNOSIS — B351 Tinea unguium: Secondary | ICD-10-CM | POA: Insufficient documentation

## 2023-11-03 DIAGNOSIS — M79674 Pain in right toe(s): Secondary | ICD-10-CM

## 2023-11-03 DIAGNOSIS — M79675 Pain in left toe(s): Secondary | ICD-10-CM

## 2023-11-03 NOTE — Progress Notes (Signed)
 This patient returns to my office for at risk foot care.  This patient requires this care by a professional since this patient will be at risk due to having CKD and chronic anticoagulation.  This patient is unable to cut nails herself since the patient cannot reach her nails.These nails are painful walking and wearing shoes.  This patient presents for at risk foot care today.  General Appearance  Alert, conversant and in no acute stress.  Vascular  Dorsalis pedis and posterior tibial  pulses are  weakly palpable  bilaterally.  Capillary return is within normal limits  bilaterally. Temperature is within normal limits  bilaterally.  Neurologic  Senn-Weinstein monofilament wire test within normal limits  bilaterally. Muscle power within normal limits bilaterally.  Nails Thick disfigured discolored nails with subungual debris  from hallux to fifth toes bilaterally. No evidence of bacterial infection or drainage bilaterally.  Orthopedic  No limitations of motion  feet .  No crepitus or effusions noted.  HAV  B/L.  Hammer toes  B/L.  Skin  normotropic skin with no porokeratosis noted bilaterally.  No signs of infections or ulcers noted.     Onychomycosis  Pain in right toes  Pain in left toes  Consent was obtained for treatment procedures.   Mechanical debridement of nails 1-5  bilaterally performed with a nail nipper.  Filed with dremel without incident.    Return office visit     3 months                 Told patient to return for periodic foot care and evaluation due to potential at risk complications.   Helane Gunther DPM

## 2023-11-04 ENCOUNTER — Encounter: Payer: Self-pay | Admitting: Family Medicine

## 2023-11-04 ENCOUNTER — Ambulatory Visit: Payer: Self-pay | Admitting: Family Medicine

## 2023-11-04 ENCOUNTER — Ambulatory Visit: Payer: Self-pay

## 2023-11-04 ENCOUNTER — Telehealth (INDEPENDENT_AMBULATORY_CARE_PROVIDER_SITE_OTHER): Payer: Self-pay | Admitting: Family Medicine

## 2023-11-04 DIAGNOSIS — R109 Unspecified abdominal pain: Secondary | ICD-10-CM | POA: Diagnosis not present

## 2023-11-04 DIAGNOSIS — N184 Chronic kidney disease, stage 4 (severe): Secondary | ICD-10-CM

## 2023-11-04 DIAGNOSIS — N898 Other specified noninflammatory disorders of vagina: Secondary | ICD-10-CM | POA: Diagnosis not present

## 2023-11-04 DIAGNOSIS — K7689 Other specified diseases of liver: Secondary | ICD-10-CM

## 2023-11-04 MED ORDER — DICYCLOMINE HCL 10 MG PO CAPS
10.0000 mg | ORAL_CAPSULE | Freq: Three times a day (TID) | ORAL | 0 refills | Status: DC
Start: 2023-11-04 — End: 2023-12-03

## 2023-11-04 MED ORDER — METRONIDAZOLE 0.75 % VA GEL: 1.0000 | Freq: Every day | VAGINAL | 0 refills | Status: AC

## 2023-11-04 NOTE — Telephone Encounter (Signed)
  Chief Complaint: vaginal itching Symptoms: vaginal itching Frequency: x 1 week Pertinent Negatives: Patient denies discharge Disposition: [] ED /[] Urgent Care (no appt availability in office) / [x] Appointment(In office/virtual)/ []  [facility]/ [] Home Care/ [] Refused Recommended Disposition /[] [facility]/ []  Follow-up with PCP Additional Notes: Patient states that she has been having vaginal itching for about a week and had tried over the counter creams and they haven't helped. States that in the past she was given a pill and it helped.   Copied from CRM [id number]. Topic: Clinical - Medical Advice >> Nov 04, 2023 11:15 AM Fuller Mandril wrote: Reason for CRM: Patient called was scheduled to come in but does not have transportation. Thinks she may have restless leg syndrome. State legs have been giving her some trouble. Also she might have possible yeast infection. Would like someone to give her a call if possible since she was not able to make appt. Does not have MyChart. Thank You Reason for Disposition  [1] Symptoms of a yeast infection (i.e., itchy, white discharge, not bad smelling) AND [2] not improved > 3 days following Care Advice  Answer Assessment - Initial Assessment Questions 1. SYMPTOM: "What's the main symptom you're concerned about?" (e.g., pain, itching, dryness)     itching 2. LOCATION: "Where is the  itching located?" (e.g., inside/outside, left/right)     Vaginal inside 3. ONSET: "When did the  itching  start?"     X1 week 4. PAIN: "Is there any pain?" If Yes, ask: "How bad is it?" (Scale: 1-10; mild, moderate, severe)   -  MILD (1-3): Doesn't interfere with normal activities.    -  MODERATE (4-7): Interferes with normal activities (e.g., work or school) or awakens from sleep.     -  SEVERE (8-10): Excruciating pain, unable to do any normal activities.     no 5. ITCHING: "Is there any itching?" If Yes, ask: "How bad is it?" (Scale: 1-10; mild, moderate,  severe)     8 6. CAUSE: "What do you think is causing the discharge?" "Have you had the same problem before? What happened then?"     No discharge just itching thinks its a yeast infection 7. OTHER SYMPTOMS: "Do you have any other symptoms?" (e.g., fever, itching, vaginal bleeding, pain with urination, injury to genital area, vaginal foreign body)     no  Protocols used: Vaginal Symptoms-A-AH

## 2023-11-04 NOTE — Progress Notes (Signed)
 Virtual Visit via Video Note  I,Tracy Nelson, CMA,acting as a scribe for [employer], NP.,have documented all relevant documentation on the behalf of Tracy Hose, NP,as directed by  Tracy Hose, NP while in the presence of Tracy Hose, NP.  I connected with Tracy Nelson on 11/08/23 at  4:20 PM EDT by a video enabled telemedicine application and verified that I am speaking with the correct person using two identifiers.  Patient Location: Home Provider Location: Home Office  I discussed the limitations, risks, security, and privacy concerns of performing an evaluation and management service by video and the availability of in person appointments. I also discussed with the patient that there may be a patient responsible charge related to this service. The patient expressed understanding and agreed to proceed.  Subjective: PCP: [name], [name]  Chief Complaint  Patient presents with   Vaginal Itching   Patient is a 78 year old female who presents for televisit for evaluation of restless/cramping in legs, pains/cramping in the stomach anytime she eats . She insisted that this has been ongoing since February 2025 when she complained about a bandlike feeling around her waist. Also she wants to know about the result of the  CT abdomen/pelvis that got back 2 days ago..She states that the cramping in her legs started about 2 weeks ago, seems her legs "moves on its own" she states it is bothering her even at night. Finally she also wants treatment for vaginal itching,      ROS: Per HPI  Current Outpatient Medications:    acetaminophen (TYLENOL) 500 MG tablet, Take 500 mg by mouth every 6 (six) hours as needed for moderate pain (pain score 4-6)., Disp: , Rfl:    amiodarone (PACERONE) 200 MG tablet, Take 1 tablet (200 mg total) by mouth daily. Take 200 mg by mouth once daily (Patient taking differently: Take 200 mg by mouth daily. Take 200 mg by mouth once daily  Taking 1/2 tablet  daily per patient), Disp: , Rfl:    calcitRIOL (ROCALTROL) 0.25 MCG capsule, Take 2 capsules by mouth daily., Disp: , Rfl:    dicyclomine (BENTYL) 10 MG capsule, Take 1 capsule (10 mg total) by mouth 3 (three) times daily before meals., Disp: 90 capsule, Rfl: 0   ELIQUIS 2.5 MG TABS tablet, TAKE 1 TABLET BY MOUTH TWICE A DAY, Disp: 60 tablet, Rfl: 5   Ensure (ENSURE), Take 237 mLs by mouth every other day., Disp: , Rfl:    erythromycin ophthalmic ointment, SMARTSIG:In Eye(s), Disp: , Rfl:    folic acid (FOLVITE) 1 MG tablet, TAKE 1 TABLET BY MOUTH EVERY DAY, Disp: 30 tablet, Rfl: 11   FUROSCIX 80 MG/10ML CTKT, Inject into the skin., Disp: , Rfl:    hydroxypropyl methylcellulose / hypromellose (ISOPTO TEARS / GONIOVISC) 2.5 % ophthalmic solution, Place 1 drop into both eyes as needed for dry eyes., Disp: , Rfl:    methocarbamol (ROBAXIN) 500 MG tablet, Take 1 tablet (500 mg total) by mouth 2 (two) times daily as needed for muscle spasms., Disp: 30 tablet, Rfl: 0   metroNIDAZOLE (METROGEL) 0.75 % vaginal gel, Place 1 Applicatorful vaginally at bedtime for 5 days., Disp: 50 g, Rfl: 0   midodrine (PROAMATINE) 2.5 MG tablet, Take 1 tablet (2.5 mg total) by mouth 3 (three) times daily with meals., Disp: 100 tablet, Rfl: 3   moxifloxacin (VIGAMOX) 0.5 % ophthalmic solution, Place 1 drop into the left eye 3 (three) times daily., Disp: , Rfl:  Multiple Vitamin (MULTIVITAMIN) tablet, Take 1 tablet by mouth 2 (two) times a week., Disp: , Rfl:    omeprazole (PRILOSEC) 40 MG capsule, TAKE 1 CAPSULE (40 MG TOTAL) BY MOUTH DAILY AS NEEDED (FOR ACID REFLUX)., Disp: 90 capsule, Rfl: 1   potassium chloride (KLOR-CON M) 10 MEQ tablet, Take 2 tablets (20 mEq total) by mouth daily., Disp: 60 tablet, Rfl: 11   predniSONE (DELTASONE) 5 MG tablet, Take 1 tablet (5 mg total) by mouth daily with breakfast., Disp: 90 tablet, Rfl: 1   sodium bicarbonate 650 MG tablet, Take 650 mg by mouth 2 (two) times daily., Disp: , Rfl:     torsemide (DEMADEX) 20 MG tablet, Take 3 tablets (60 mg total) by mouth 2 (two) times daily. (Patient taking differently: Take 60 mg by mouth 2 (two) times daily. Taking 60 mg am and 40 mg pm), Disp: 180 tablet, Rfl: 3  Observations/Objective: There were no vitals filed for this visit. Physical Exam Vitals reviewed: This is a virtual visit.  Neurological:     Mental Status: She is alert and oriented to person, place, and time.     Assessment and Plan: Vaginal itching -     metroNIDAZOLE; Place 1 Applicatorful vaginally at bedtime for 5 days.  Dispense: 50 g; Refill: 0  Hepatic cyst Assessment & Plan: Found on CT abdomen 10/28/1023; [place] RUQ recommended;has been ordered  Orders: -     Ambulatory referral to Gastroenterology -     US ABDOMEN LIMITED RUQ (LIVER/GB); Future  Abdominal cramps Assessment & Plan: Take bentyl before meals ; refer to GI  Orders: -     Dicyclomine HCl; Take 1 capsule (10 mg total) by mouth 3 (three) times daily before meals.  Dispense: 90 capsule; Refill: 0  CKD (chronic kidney disease), stage IV (HCC) Assessment & Plan: Avoid nephrotoxic meds     Follow Up Instructions: Return if symptoms worsen or fail to improve.   I discussed the assessment and treatment plan with the patient. The patient was provided an opportunity to ask questions, and all were answered. The patient agreed with the plan and demonstrated an understanding of the instructions.   The patient was advised to call back or seek an in-person evaluation if the symptoms worsen or if the condition fails to improve as anticipated.  The above assessment and management plan was discussed with the patient. The patient verbalized understanding of and has agreed to the management plan.   I, Tracy Hose, NP, have reviewed all documentation for this visit. The documentation on 11/08/2023 for the exam, diagnosis, procedures, and orders are all accurate and complete.

## 2023-11-05 ENCOUNTER — Encounter: Payer: Self-pay | Admitting: Family Medicine

## 2023-11-08 ENCOUNTER — Other Ambulatory Visit: Payer: Self-pay | Admitting: Family Medicine

## 2023-11-08 DIAGNOSIS — M62830 Muscle spasm of back: Secondary | ICD-10-CM

## 2023-11-08 DIAGNOSIS — R9389 Abnormal findings on diagnostic imaging of other specified body structures: Secondary | ICD-10-CM | POA: Insufficient documentation

## 2023-11-08 DIAGNOSIS — R109 Unspecified abdominal pain: Secondary | ICD-10-CM | POA: Insufficient documentation

## 2023-11-08 DIAGNOSIS — N898 Other specified noninflammatory disorders of vagina: Secondary | ICD-10-CM | POA: Insufficient documentation

## 2023-11-08 DIAGNOSIS — K7689 Other specified diseases of liver: Secondary | ICD-10-CM | POA: Insufficient documentation

## 2023-11-08 NOTE — Assessment & Plan Note (Signed)
 Avoid nephrotoxic meds

## 2023-11-08 NOTE — Assessment & Plan Note (Signed)
 Found on CT abdomen 10/28/1023; [place] RUQ recommended;has been ordered

## 2023-11-08 NOTE — Assessment & Plan Note (Signed)
 Take bentyl before meals ; refer to GI

## 2023-11-09 ENCOUNTER — Inpatient Hospital Stay: Admission: RE | Admit: 2023-11-09 | Source: Ambulatory Visit

## 2023-11-10 ENCOUNTER — Encounter: Payer: Self-pay | Admitting: Family Medicine

## 2023-11-11 ENCOUNTER — Ambulatory Visit
Admission: RE | Admit: 2023-11-11 | Discharge: 2023-11-11 | Discharge status: Home / Self Care | Source: Ambulatory Visit | Attending: Family Medicine | Admitting: Family Medicine

## 2023-11-11 DIAGNOSIS — K7689 Other specified diseases of liver: Secondary | ICD-10-CM

## 2023-11-11 DIAGNOSIS — K828 Other specified diseases of gallbladder: Secondary | ICD-10-CM | POA: Diagnosis not present

## 2023-11-16 ENCOUNTER — Ambulatory Visit: Payer: Self-pay

## 2023-11-17 ENCOUNTER — Telehealth (HOSPITAL_COMMUNITY): Payer: Self-pay

## 2023-11-17 DIAGNOSIS — M069 Rheumatoid arthritis, unspecified: Secondary | ICD-10-CM | POA: Diagnosis not present

## 2023-11-17 DIAGNOSIS — Z961 Presence of intraocular lens: Secondary | ICD-10-CM | POA: Diagnosis not present

## 2023-11-17 DIAGNOSIS — H16123 Filamentary keratitis, bilateral: Secondary | ICD-10-CM | POA: Diagnosis not present

## 2023-11-17 DIAGNOSIS — M3500 Sicca syndrome, unspecified: Secondary | ICD-10-CM | POA: Diagnosis not present

## 2023-11-17 NOTE — Patient Instructions (Signed)
 Visit Information  Thank you for taking time to visit with me today. Please don't hesitate to contact me if I can be of assistance to you before our next scheduled appointment.  Our next appointment is by telephone on 12/01/23 at 09:00 AM Please call the care guide team at [phone] if you need to cancel or reschedule your appointment.   Following is a copy of your care plan:   Goals Addressed             This Visit's Progress    COMPLETED: To improve Self-Health management of CHF       Care Coordination Interventions: See new goals      VBCI RN Care Plan related to CHF       Problems:  Chronic Disease Management support and education needs related to CHF  Goal: Over the next 30 days the Patient will demonstrate a decrease CHF in exacerbations as evidenced by patient will report increased knowledge and understanding related self-management of CHF  Interventions:   Heart Failure Interventions:  (Status:  New goal.) Short Term Goal Basic overview and discussion of pathophysiology of Heart Failure reviewed Provided education on low sodium diet Reviewed Heart Failure Action Plan in depth and provided written copy Provided education about placing scale on hard, flat surface Discussed importance of daily weight and advised patient to weigh and record daily Reviewed role of diuretics in prevention of fluid overload and management of heart failure; Discussed the importance of keeping all appointments with provider  Patient Self-Care Activities:  Attend all scheduled provider appointments Call pharmacy for medication refills 3-7 days in advance of running out of medications Call provider office for new concerns or questions  call office if I gain more than 2 pounds in one day or 5 pounds in one week track weight in diary watch for swelling in feet, ankles and legs every day weigh myself daily  Plan:  Follow up with provider re: symptoms suggestive of heart failure not improved  by use of diuretics as directed  Telephone follow up appointment with care management team member scheduled for:  12/01/23 @09 :00 AM        Please call [phone]-TALK (toll free, 24 hour hotline) if you are experiencing a Mental Health or Behavioral Health Crisis or need someone to talk to.  The patient verbalized understanding of instructions, educational materials, and care plan provided today and DECLINED offer to receive copy of patient instructions, educational materials, and care plan.   Delsa Sale RN BSN CCM [place]  [facility], [facility] Health Nurse Care Coordinator  Direct Dial: [phone] Website: [email or web address]

## 2023-11-17 NOTE — Patient Outreach (Signed)
 Complex Care Management   Visit Note  11/17/2023  Name:  Tracy Nelson MRN: [id number] DOB: [birth year]  Situation: Referral received for Complex Care Management related to Heart Failure I obtained verbal consent from patient.  Visit completed with patient   on the phone  Background:   Past Medical History:  Diagnosis Date   Acid reflux 07/15/2020   Aortic insufficiency    Arthritis 07/15/2020   Atrial flutter (HCC)    Colitis 07/15/2020   Dilated cardiomyopathy (HCC) 07/15/2020   HFrEF (heart failure with reduced ejection fraction) (HCC) 07/15/2020   HTN (hypertension) 07/15/2020   Mitral regurgitation    Osteoporosis 07/15/2020   PAF (paroxysmal atrial fibrillation) (HCC)    Palpitations 07/15/2020   Rheumatoid arthritis (HCC) 07/15/2020   Tricuspid regurgitation     Assessment: Patient Reported Symptoms:  Cognitive    Neurological No symptoms reported    HEENT Other: (ear ringing) ear ringing  Cardiovascular Other: (shortness of breath) shortness of breath  Respiratory No symptoms reported    Endocrine No symptoms reported    Gastrointestinal Abdominal pain or discomfort, Cramping    Genitourinary No symptoms reported    Integumentary Other intermittent rash with itching  Musculoskeletal Difficulty walking, Unsteady gait    Psychosocial No symptoms reported     There were no vitals filed for this visit.  Medications Reviewed Today     Reviewed by Riley Churches, RN (Registered Nurse) on 11/16/23 at 1202  Med List Status: <None>   Medication Order Taking? Sig Documenting Provider Last Dose Status Informant  acetaminophen (TYLENOL) 500 MG tablet [id number] Yes Take 500 mg by mouth every 6 (six) hours as needed for moderate pain (pain score 4-6). [provider] Taking Active Self, Pharmacy Records  amiodarone (PACERONE) 200 MG tablet [id number] Yes Take 1 tablet (200 mg total) by mouth daily. Take 200 mg by mouth once daily  Patient taking  differently: Take 200 mg by mouth daily. Take 200 mg by mouth once daily  Taking 1/2 tablet daily per patient   Zannie Cove, MD Taking Active   calcitRIOL (ROCALTROL) 0.25 MCG capsule [id number] Yes Take 2 capsules by mouth daily. [provider] Taking Active Self, Pharmacy Records  dicyclomine (BENTYL) 10 MG capsule [id number] Yes Take 1 capsule (10 mg total) by mouth 3 (three) times daily before meals. [name], NP Taking Active   ELIQUIS 2.5 MG TABS tablet [id number] Yes TAKE 1 TABLET BY MOUTH TWICE A DAY Laurey Morale, MD Taking Active   Ensure [facility]) [id number] Yes Take 237 mLs by mouth every other day. [provider] Taking Active Self, Pharmacy Records  erythromycin ophthalmic ointment [id number]  SMARTSIG:In Eye(s) [provider]  Consider Medication Status and Discontinue (Completed Course)            Med Note Beecher Mcardle   Wed Oct 20, 2023  4:12 PM) PRN  folic acid (FOLVITE) 1 MG tablet [id number] Yes TAKE 1 TABLET BY MOUTH EVERY DAY Laurey Morale, MD Taking Active   FUROSCIX 80 MG/10ML CTKT [id number] No Inject into the skin.  Patient not taking: Reported on 11/16/2023   [provider] Not Taking Active   hydroxypropyl methylcellulose / hypromellose (ISOPTO TEARS / GONIOVISC) 2.5 % ophthalmic solution [id number] Yes Place 1 drop into both eyes as needed for dry eyes. [provider] Taking Active Self, Pharmacy Records  methocarbamol (ROBAXIN) 500 MG tablet [id number] Yes TAKE 1 TABLET (500 MG TOTAL) BY MOUTH 2 (TWO) TIMES  DAILY AS NEEDED FOR MUSCLE SPASMS. [name], NP Taking Active   midodrine (PROAMATINE) 2.5 MG tablet [id number] Yes Take 1 tablet (2.5 mg total) by mouth 3 (three) times daily with meals. [place], [name], [name] Taking Active   moxifloxacin (VIGAMOX) 0.5 % ophthalmic solution [id number] Yes Place 1 drop into the left eye 3 (three) times daily. [provider] Taking Active   Multiple Vitamin  (MULTIVITAMIN) tablet [id number] Yes Take 1 tablet by mouth 2 (two) times a week. [provider] Taking Active Self, Pharmacy Records  omeprazole (PRILOSEC) 40 MG capsule [id number] Yes TAKE 1 CAPSULE (40 MG TOTAL) BY MOUTH DAILY AS NEEDED (FOR ACID REFLUX). [name], [name] Taking Active   potassium chloride (KLOR-CON M) 10 MEQ tablet [id number] Yes Take 2 tablets (20 mEq total) by mouth daily.  Patient taking differently: Take 20 mEq by mouth daily. Patient is only taking 1 tablet daily.   [place], [name], [name] Taking Active   predniSONE (DELTASONE) 5 MG tablet [id number] Yes Take 1 tablet (5 mg total) by mouth daily with breakfast. Fuller Plan, MD Taking Active Self, Pharmacy Records  sodium bicarbonate 650 MG tablet [id number] Yes Take 650 mg by mouth 2 (two) times daily. [provider] Taking Active Self, Pharmacy Records  torsemide (DEMADEX) 20 MG tablet [id number] Yes Take 3 tablets (60 mg total) by mouth 2 (two) times daily.  Patient taking differently: Take 60 mg by mouth 2 (two) times daily. Taking 60 mg am and 40 mg pm   Jacklynn Ganong, [name] Taking Active             Recommendation:   PCP Follow-up Specialty provider follow-up as directed   Follow Up Plan:   Telephone follow up appointment date/time:  12/01/23 @09 :00 AM  Delsa Sale RN BSN CCM [place]  [facility], [facility] Health Nurse Care Coordinator  Direct Dial: [phone] Website: [email or web address]

## 2023-11-18 ENCOUNTER — Ambulatory Visit: Payer: Self-pay

## 2023-11-18 NOTE — Telephone Encounter (Signed)
 Copied from CRM [id number]. Topic: Clinical - Red Word Triage >> Nov 18, 2023 11:03 AM Geroge Baseman wrote: Red Word that prompted transfer to Nurse Triage: lost of discomfort, believes it is a yeast infection  Chief Complaint: vaginal itching Symptoms: itching Frequency: constant Pertinent Negatives: Patient denies fever, discharge Disposition: [] ED /[x] Urgent Care (no appt availability in office) / [] Appointment(In office/virtual)/ []  [facility]/ [] Home Care/ [] Refused Recommended Disposition /[] [facility]/ []  Follow-up with PCP Additional Notes: wanted a virtual apt for vaginal itching; no apt available; instructed to go to the UC.  Care advice given, denies questions; instructed to go to ER if becomes worse.   Reason for Disposition  [1] Symptoms of a "yeast infection" (i.e., itchy, white discharge, not bad smelling) AND [2] not improved > 3 days following Care Advice  Answer Assessment - Initial Assessment Questions 1. DISCHARGE: "Describe the discharge." (e.g., white, yellow, green, gray, foamy, cottage cheese-like)     itching 2. ODOR: "Is there a bad odor?"     denies 3. ONSET: "When did the discharge begin?"     Itching started week ago 4. RASH: "Is there a rash in the genital area?" If Yes, ask: "Describe it." (e.g., redness, blisters, sores, bumps)     no 5. ABDOMEN PAIN: "Are you having any abdomen pain?" If Yes, ask: "What does it feel like? " (e.g., crampy, dull, intermittent, constant)      denies 6. ABDOMEN PAIN SEVERITY: If present, ask: "How bad is it?" (e.g., Scale 1-10; mild, moderate, or severe)   - MILD (1-3): Doesn't interfere with normal activities, abdomen soft and not tender to touch.    - MODERATE (4-7): Interferes with normal activities or awakens from sleep, abdomen tender to touch.    - SEVERE (8-10): Excruciating pain, doubled over, unable to do any normal activities. (R/O peritonitis)     denies 7. CAUSE: "What do you think is causing  the discharge?" "Have you had the same problem before? What happened then?"     Yeast infection 8. OTHER SYMPTOMS: "Do you have any other symptoms?" (e.g., fever, itching, vaginal bleeding, pain with urination, injury to genital area, vaginal foreign body)     itching 9. PREGNANCY: "Is there any chance you are pregnant?" "When was your last menstrual period?"     na  Protocols used: Vaginal Discharge-A-AH

## 2023-11-18 NOTE — Telephone Encounter (Signed)
 Called pt to see if she was avail for appoint this afternoon. Late notice I know, I didn't get a chance to call her day before due to being so busy and she does not do mornings.  She reports she was the doc office now and will call me once she gets home.    Kerry Hough, EMT-Paramedic  [phone] 11/17/2023

## 2023-11-29 ENCOUNTER — Encounter: Payer: Self-pay | Admitting: Nurse Practitioner

## 2023-11-29 ENCOUNTER — Ambulatory Visit: Payer: Medicare PPO | Admitting: Nurse Practitioner

## 2023-11-29 ENCOUNTER — Other Ambulatory Visit (HOSPITAL_COMMUNITY): Payer: Self-pay | Admitting: *Deleted

## 2023-11-29 VITALS — BP 110/60 | HR 85 | Temp 98.7°F | Wt 103.0 lb

## 2023-11-29 DIAGNOSIS — R1311 Dysphagia, oral phase: Secondary | ICD-10-CM

## 2023-11-29 DIAGNOSIS — R636 Underweight: Secondary | ICD-10-CM

## 2023-11-29 DIAGNOSIS — I5022 Chronic systolic (congestive) heart failure: Secondary | ICD-10-CM

## 2023-11-29 DIAGNOSIS — I132 Hypertensive heart and chronic kidney disease with heart failure and with stage 5 chronic kidney disease, or end stage renal disease: Secondary | ICD-10-CM | POA: Diagnosis not present

## 2023-11-29 DIAGNOSIS — R197 Diarrhea, unspecified: Secondary | ICD-10-CM

## 2023-11-29 DIAGNOSIS — N185 Chronic kidney disease, stage 5: Secondary | ICD-10-CM

## 2023-11-29 DIAGNOSIS — Z2821 Immunization not carried out because of patient refusal: Secondary | ICD-10-CM | POA: Diagnosis not present

## 2023-11-29 DIAGNOSIS — I1311 Hypertensive heart and chronic kidney disease without heart failure, with stage 5 chronic kidney disease, or end stage renal disease: Secondary | ICD-10-CM

## 2023-11-29 DIAGNOSIS — G8929 Other chronic pain: Secondary | ICD-10-CM

## 2023-11-29 DIAGNOSIS — M25512 Pain in left shoulder: Secondary | ICD-10-CM

## 2023-11-29 DIAGNOSIS — M3500 Sicca syndrome, unspecified: Secondary | ICD-10-CM | POA: Diagnosis not present

## 2023-11-29 DIAGNOSIS — I10 Essential (primary) hypertension: Secondary | ICD-10-CM

## 2023-11-29 DIAGNOSIS — M069 Rheumatoid arthritis, unspecified: Secondary | ICD-10-CM | POA: Diagnosis not present

## 2023-11-29 DIAGNOSIS — H16123 Filamentary keratitis, bilateral: Secondary | ICD-10-CM | POA: Diagnosis not present

## 2023-11-29 DIAGNOSIS — R1310 Dysphagia, unspecified: Secondary | ICD-10-CM

## 2023-11-29 DIAGNOSIS — Z961 Presence of intraocular lens: Secondary | ICD-10-CM | POA: Diagnosis not present

## 2023-11-29 DIAGNOSIS — G2581 Restless legs syndrome: Secondary | ICD-10-CM

## 2023-11-29 DIAGNOSIS — Z681 Body mass index (BMI) 19 or less, adult: Secondary | ICD-10-CM

## 2023-11-29 MED ORDER — GABAPENTIN 100 MG PO CAPS: 100.0000 mg | ORAL_CAPSULE | Freq: Every day | ORAL | 2 refills | Status: DC

## 2023-11-29 MED ORDER — DICLOFENAC SODIUM 1 % EX GEL: 2.0000 g | Freq: Four times a day (QID) | CUTANEOUS | 2 refills | Status: DC

## 2023-11-29 NOTE — Progress Notes (Addendum)
 [place], CMA,acting as a Neurosurgeon for Susanna Epley, [name].,have documented all relevant documentation on the behalf of Susanna Epley, [name],as directed by  Susanna Epley, [name] while in the presence of Susanna Epley, [name].  Subjective:  [name] , female    DOB: 11-10-1945 , 78 y.o.   MRN: [id number]  Chief Complaint  Patient presents with   Diarrhea    HPI  Patient presents today for diarrhea for a while, trouble swallowing everything, including small pills. She reports she has to eat and drink very slow. Patient reports she has restless legs in her left leg and it is now in her right leg.   She is having pain to her left shoulder and has been applying heat. Has not used any pain cream.  When she takes her medications she reports  She is to start perioteneal dialysis. She has not made up her mind if she wants to have done. She would like for her to start She is to have an 11 hour process. Dr. Mitzie Anda feels she is not strong enough to do peritoneal dialysis. She is concerned about if she has to lay around.  She has the paramedic that will come out to her monthly.  She has to eat soft foods and pills due to difficulty with swallowing.  She would like to try the pain medication before referring to ortho   Past Medical History:  Diagnosis Date   Acid reflux 07/15/2020   Aortic insufficiency    Arthritis 07/15/2020   Atrial flutter (HCC)    Colitis 07/15/2020   Dilated cardiomyopathy (HCC) 07/15/2020   HFrEF (heart failure with reduced ejection fraction) (HCC) 07/15/2020   HTN (hypertension) 07/15/2020   Mitral regurgitation    Osteoporosis 07/15/2020   PAF (paroxysmal atrial fibrillation) (HCC)    Palpitations 07/15/2020   Rheumatoid arthritis (HCC) 07/15/2020   Tricuspid regurgitation      Family History  Problem Relation Age of Onset   Hypertension Mother    Diabetes Mother    Hypertension Father    Diabetes Father    Breast cancer Maternal Aunt    Breast  cancer Paternal Aunt    Arthritis Maternal Grandmother    Lung disease Paternal Grandfather    Cancer Brother      Current Outpatient Medications:    acetaminophen  (TYLENOL ) 500 MG tablet, Take 500 mg by mouth every 6 (six) hours as needed for moderate pain (pain score 4-6)., Disp: , Rfl:    calcitRIOL  (ROCALTROL ) 0.25 MCG capsule, Take 2 capsules by mouth daily., Disp: , Rfl:    diclofenac  Sodium (VOLTAREN ) 1 % GEL, Apply 2 g topically 4 (four) times daily., Disp: 100 g, Rfl: 2   ELIQUIS  2.5 MG TABS tablet, TAKE 1 TABLET BY MOUTH TWICE A DAY, Disp: 60 tablet, Rfl: 5   Ensure (ENSURE), Take 237 mLs by mouth every other day., Disp: , Rfl:    erythromycin ophthalmic ointment, SMARTSIG:In Eye(s), Disp: , Rfl:    folic acid  (FOLVITE ) 1 MG tablet, TAKE 1 TABLET BY MOUTH EVERY DAY, Disp: 30 tablet, Rfl: 11   gabapentin  (NEURONTIN ) 100 MG capsule, Take 1 capsule (100 mg total) by mouth daily., Disp: 30 capsule, Rfl: 2   hydroxypropyl methylcellulose / hypromellose (ISOPTO TEARS / GONIOVISC) 2.5 % ophthalmic solution, Place 1 drop into both eyes as needed for dry eyes., Disp: , Rfl:    methocarbamol  (ROBAXIN ) 500 MG tablet, TAKE 1 TABLET (500 MG TOTAL) BY MOUTH 2 (TWO) TIMES  DAILY AS NEEDED FOR MUSCLE SPASMS., Disp: 30 tablet, Rfl: 0   midodrine  (PROAMATINE ) 2.5 MG tablet, Take 1 tablet (2.5 mg total) by mouth 3 (three) times daily with meals., Disp: 100 tablet, Rfl: 3   moxifloxacin (VIGAMOX) 0.5 % ophthalmic solution, Place 1 drop into the left eye 3 (three) times daily., Disp: , Rfl:    Multiple Vitamin (MULTIVITAMIN) tablet, Take 1 tablet by mouth 2 (two) times a week., Disp: , Rfl:    omeprazole  (PRILOSEC) 40 MG capsule, TAKE 1 CAPSULE (40 MG TOTAL) BY MOUTH DAILY AS NEEDED (FOR ACID REFLUX)., Disp: 90 capsule, Rfl: 1   sodium bicarbonate  650 MG tablet, Take 650 mg by mouth 2 (two) times daily., Disp: , Rfl:    torsemide  (DEMADEX ) 20 MG tablet, Take 3 tablets (60 mg total) by mouth 2 (two) times  daily., Disp: 180 tablet, Rfl: 3   amiodarone  (PACERONE ) 200 MG tablet, Take 1 tablet (200 mg total) by mouth daily. Take 200 mg by mouth once daily, Disp: 90 tablet, Rfl: 3   dicyclomine  (BENTYL ) 10 MG capsule, TAKE 1 CAPSULE (10 MG TOTAL) BY MOUTH 3 (THREE) TIMES DAILY BEFORE MEALS., Disp: 90 capsule, Rfl: 0   FUROSCIX  80 MG/10ML CTKT, Inject into the skin., Disp: , Rfl:    potassium chloride  (KLOR-CON  M) 10 MEQ tablet, Take 2 tablets (20 mEq total) by mouth daily., Disp: 60 tablet, Rfl: 11   predniSONE  (DELTASONE ) 5 MG tablet, TAKE 1 TABLET BY MOUTH EVERY DAY WITH BREAKFAST, Disp: 90 tablet, Rfl: 0   Allergies  Allergen Reactions   Baclofen Nausea And Vomiting   Penicillins Rash     Review of Systems  Constitutional: Negative.   HENT: Negative.    Eyes: Negative.   Respiratory: Negative.    Cardiovascular: Negative.   Gastrointestinal:  Negative for diarrhea (soft stools).  Musculoskeletal:        Shoulder pain     Today's Vitals   11/29/23 1104  BP: 110/60  Pulse: 85  Temp: 98.7 F (37.1 C)  TempSrc: Oral  Weight: 103 lb (46.7 kg)  PainSc: 8   PainLoc: Shoulder   Body mass index is 17.68 kg/m.  Wt Readings from Last 3 Encounters:  12/01/23 93 lb 6.4 oz (42.4 kg)  11/30/23 92 lb (41.7 kg)  11/29/23 103 lb (46.7 kg)      Objective:  Physical Exam Vitals and nursing note reviewed.  Constitutional:      General: She is not in acute distress.    Appearance: Normal appearance.  Cardiovascular:     Rate and Rhythm: Normal rate and regular rhythm.     Pulses: Normal pulses.     Heart sounds: Normal heart sounds. No murmur heard. Pulmonary:     Effort: Pulmonary effort is normal. No respiratory distress.     Breath sounds: Normal breath sounds. No wheezing.  Musculoskeletal:        General: Tenderness (left shoulder tenderness and pain with range of motion) present.  Skin:    General: Skin is warm and dry.     Capillary Refill: Capillary refill takes less than  2 seconds.  Neurological:     General: No focal deficit present.     Mental Status: She is alert and oriented to person, place, and time.     Cranial Nerves: No cranial nerve deficit.     Motor: No weakness.  Psychiatric:        Mood and Affect: Mood normal.  Behavior: Behavior normal.        Thought Content: Thought content normal.        Judgment: Judgment normal.         Assessment And Plan:  Herpes zoster vaccination declined Assessment & Plan: Declines shingrix, educated on disease process and is aware if he changes his mind to notify office    Diarrhea, unspecified type Assessment & Plan: She is describing soft stool. Advised to avoid taking any stool softners and to stay well hydrated with water.    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 Continue f/u with Cardiologist   Stage 5 chronic kidney disease not on chronic dialysis [facility]) Assessment & Plan: Continue f/u with Nephrology  Orders: -     CMP14+EGFR  Chronic systolic CHF (congestive heart failure) (HCC) Assessment & Plan: Continue f/u with Cardiologist.    Oral phase dysphagia Assessment & Plan: She is having some difficulty with swallowing and would like to have a speech evaluation.   Orders: -     SLP modified barium swallow; Future  Chronic left shoulder pain Assessment & Plan: Would like to try pain cream to her shoulder, she does have pain with range of motion.   Orders: -     Diclofenac  Sodium; Apply 2 g topically 4 (four) times daily.  Dispense: 100 g; Refill: 2 -     Brain natriuretic peptide  Restless legs -     Gabapentin ; Take 1 capsule (100 mg total) by mouth daily.  Dispense: 30 capsule; Refill: 2  COVID-19 vaccination declined Assessment & Plan: Declines covid 19 vaccine. Discussed risk of covid 45 and if she changes her mind about the vaccine to call  the office. Education has been provided regarding the importance of this vaccine but patient still declined. Advised may receive this vaccine at local pharmacy or Health Dept.or vaccine clinic. Aware to provide a copy of the vaccination record if obtained from local pharmacy or Health Dept.  Encouraged to take multivitamin, vitamin d , vitamin c and zinc to increase immune system. Aware can call office if would like to have vaccine here at office. Verbalized acceptance and understanding.    Underweight (BMI < 18.5)    Return for 6 month bp check.  Patient was given opportunity to ask questions. Patient verbalized understanding of the plan and was able to repeat key elements of the plan. All questions were answered to their satisfaction.    Inge Mangle, [name], have reviewed all documentation for this visit. The documentation on 11/29/2023 for the exam, diagnosis, procedures, and orders are all accurate and complete.   IF YOU HAVE BEEN REFERRED TO A SPECIALIST, IT MAY TAKE 1-2 WEEKS TO SCHEDULE/PROCESS THE REFERRAL. IF YOU HAVE NOT HEARD FROM US /SPECIALIST IN TWO WEEKS, PLEASE GIVE US  A CALL AT [phone].

## 2023-11-30 ENCOUNTER — Other Ambulatory Visit (HOSPITAL_COMMUNITY): Payer: Self-pay

## 2023-11-30 MED ORDER — AMIODARONE HCL 200 MG PO TABS: 200.0000 mg | ORAL_TABLET | Freq: Every day | ORAL | 3 refills | Status: DC

## 2023-11-30 MED ORDER — POTASSIUM CHLORIDE CRYS ER 10 MEQ PO TBCR: 20.0000 meq | EXTENDED_RELEASE_TABLET | Freq: Every day | ORAL | 11 refills | Status: DC

## 2023-11-30 NOTE — Progress Notes (Signed)
 Paramedicine Encounter    [name], female    DOB: 1945-12-14, 78 y.o.   MRN: [id number]   Complaints-multiple  Edema-none   Compliance with meds-yes   Pill box filled-n/a  If so, by whom-pt does it   Refills needed-n/a  Pt reports that she has not been feeling well.  She has been trying to take the midodrine  to see if it helps her. She has been taking it for about 2wks. She feels like she has no energy the last few days. Yesterday she had to walk a long ways at a doc office.  Today her stomach "has been tore up" today worse than usual.  She ate baked fish dinner from [employer],cabbage, sweet potato and coconut pie. She normally feels like that food sets on her stomach fine.  Her b/p has been higher at her appoint yesterday.   Dialysis--her daughter has not went thru the training part yet. Pt said that there are some personal things going on with them. She thinks daughter is in denial or something or doesn't think she needs it.  Pt is still not decided if she is going to proceed or not.   She does not know when she needs to go back to kidney doc. She reports she is due for labs and has order for [facility].   She has started having nerve issues. Like things crawling on her face, shoulder pain as well.  Looks like her PCP gave her gabapentin . She has not started taking that.  She reports she had shingles last year to her shoulder. So possible long term affects from that is what she has been told.  She has been referred to do a barium swallow test due to issues swallowing.  Appetite comes and goes.   She does good managing her medical issues and today we reviewed meds and had some different dosing instructions on the amio and potassium so I sent message to triage ref that and to clarify.    Advised her that she likely feels bad b/c of the low b/p and encouraged her to continue to try the midodrine  for a few more weeks at [facility]-she said she may try it once a day but advised  her its a short acting med.   Will f/u in a few wks.    BP (!) 84/50   Pulse 81   Resp 16   Wt 92 lb (41.7 kg)   SpO2 99%   BMI 15.79 kg/m  Weight yesterday-93 Last visit weight-101 @ clinic   Patient Care Team: Susanna Epley, [name] as PCP - General (General Practice) Denton Flakes, LCSW as Social Worker (Licensed Clinical Social Worker) Arlinda Lais, [place] Dk, RPH (Inactive) (Pharmacist) Darlis Eisenmenger, MD as Consulting Physician (Cardiology)  Patient Active Problem List   Diagnosis Date Noted  . Vaginal itching 11/08/2023  . Hepatic cyst 11/08/2023  . Abnormal finding of diagnostic imaging 11/08/2023  . Abdominal cramps 11/08/2023  . Pain due to onychomycosis of toenails of both feet 11/03/2023  . Back muscle spasm 09/22/2023  . Lower abdominal pain 09/22/2023  . Upper back pain 09/22/2023  . Vaginal irritation 07/28/2023  . Acute clinical systolic heart failure (HCC) 07/10/2023  . Acute on chronic systolic CHF (congestive heart failure) (HCC) 07/10/2023  . CHF exacerbation (HCC) 07/09/2023  . Elevated troponin 07/09/2023  . PAF (paroxysmal atrial fibrillation) (HCC) 06/08/2023  . Influenza vaccination declined 06/08/2023  . Herpes zoster vaccination declined 06/08/2023  . COVID-19 vaccination  declined 06/08/2023  . Bumps on skin 02/03/2023  . Acquired thrombophilia (HCC) 02/03/2023  . Prediabetes 01/13/2023  . Prolonged QT interval 10/30/2022  . Cataract 09/14/2022  . Uterine prolapse 09/14/2022  . Pressure ulcer 09/03/2022  . Acute heart failure with reduced ejection fraction (HFrEF, <= 40%) (HCC) 08/30/2022  . Anemia of chronic disease 08/30/2022  . Chronic systolic CHF (congestive heart failure) (HCC) 07/13/2022  . CKD (chronic kidney disease), stage IV (HCC) 07/09/2022  . 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 Monoclonal gammopathy 03/10/2021  . Atypical chest pain 10/22/2020  . CKD (chronic kidney disease), stage III (HCC) 10/22/2020  . Atrial flutter (HCC)  10/18/2020  . Arthritis 07/15/2020  . GERD (gastroesophageal reflux disease) 07/15/2020  . Non-ischemic cardiomyopathy (HCC) 07/15/2020  . HFrEF (heart failure with reduced ejection fraction) (HCC) 07/15/2020  . HTN (hypertension) 07/15/2020  . Osteoporosis 07/15/2020  . Raynaud's disease 05/05/2019  . Rheumatoid arthritis involving multiple sites with positive rheumatoid factor (HCC) 10/28/2015  . Mitral regurgitation 05/30/2014  . Vitamin D  deficiency 05/15/2013  . Encounter for long-term (current) use of other medications 12/14/2008  . Sjogren's syndrome (HCC) 12/14/2008    Current Outpatient Medications:  .  acetaminophen  (TYLENOL ) 500 MG tablet, Take 500 mg by mouth every 6 (six) hours as needed for moderate pain (pain score 4-6)., Disp: , Rfl:  .  amiodarone  (PACERONE ) 200 MG tablet, Take 1 tablet (200 mg total) by mouth daily. Take 200 mg by mouth once daily (Patient taking differently: Take 200 mg by mouth daily.), Disp: , Rfl:  .  calcitRIOL  (ROCALTROL ) 0.25 MCG capsule, Take 2 capsules by mouth daily., Disp: , Rfl:  .  diclofenac  Sodium (VOLTAREN ) 1 % GEL, Apply 2 g topically 4 (four) times daily., Disp: 100 g, Rfl: 2 .  ELIQUIS  2.5 MG TABS tablet, TAKE 1 TABLET BY MOUTH TWICE A DAY, Disp: 60 tablet, Rfl: 5 .  Ensure (ENSURE), Take 237 mLs by mouth every other day., Disp: , Rfl:  .  folic acid  (FOLVITE ) 1 MG tablet, TAKE 1 TABLET BY MOUTH EVERY DAY, Disp: 30 tablet, Rfl: 11 .  gabapentin  (NEURONTIN ) 100 MG capsule, Take 1 capsule (100 mg total) by mouth daily., Disp: 30 capsule, Rfl: 2 .  hydroxypropyl methylcellulose / hypromellose (ISOPTO TEARS / GONIOVISC) 2.5 % ophthalmic solution, Place 1 drop into both eyes as needed for dry eyes., Disp: , Rfl:  .  methocarbamol  (ROBAXIN ) 500 MG tablet, TAKE 1 TABLET (500 MG TOTAL) BY MOUTH 2 (TWO) TIMES DAILY AS NEEDED FOR MUSCLE SPASMS., Disp: 30 tablet, Rfl: 0 .  midodrine  (PROAMATINE ) 2.5 MG tablet, Take 1 tablet (2.5 mg total) by mouth  3 (three) times daily with meals., Disp: 100 tablet, Rfl: 3 .  moxifloxacin (VIGAMOX) 0.5 % ophthalmic solution, Place 1 drop into the left eye 3 (three) times daily., Disp: , Rfl:  .  Multiple Vitamin (MULTIVITAMIN) tablet, Take 1 tablet by mouth 2 (two) times a week., Disp: , Rfl:  .  omeprazole  (PRILOSEC) 40 MG capsule, TAKE 1 CAPSULE (40 MG TOTAL) BY MOUTH DAILY AS NEEDED (FOR ACID REFLUX)., Disp: 90 capsule, Rfl: 1 .  potassium chloride  (KLOR-CON  M) 10 MEQ tablet, Take 2 tablets (20 mEq total) by mouth daily. (Patient taking differently: Take 20 mEq by mouth daily. Patient is only taking 1 tablet daily.), Disp: 60 tablet, Rfl: 11 .  predniSONE  (DELTASONE ) 5 MG tablet, Take 1 tablet (5 mg total) by mouth daily with breakfast., Disp: 90 tablet, Rfl: 1 .  sodium bicarbonate  650 MG tablet, Take 650 mg by mouth 2 (two) times daily., Disp: , Rfl:  .  dicyclomine  (BENTYL ) 10 MG capsule, Take 1 capsule (10 mg total) by mouth 3 (three) times daily before meals. (Patient not taking: Reported on 11/30/2023), Disp: 90 capsule, Rfl: 0 .  erythromycin ophthalmic ointment, SMARTSIG:In Eye(s), Disp: , Rfl:  .  FUROSCIX  80 MG/10ML CTKT, Inject into the skin. (Patient not taking: Reported on 11/16/2023), Disp: , Rfl:  .  torsemide  (DEMADEX ) 20 MG tablet, Take 3 tablets (60 mg total) by mouth 2 (two) times daily., Disp: 180 tablet, Rfl: 3 Allergies  Allergen Reactions  . Baclofen Nausea And Vomiting  . Penicillins Rash      Social History   Socioeconomic History  . Marital status: Widowed    Spouse name: Not on file  . Number of children: Not on file  . Years of education: 25  . Highest education level: Bachelor's degree (e.g., BA, AB, BS)  Occupational History  . Occupation: Retired  Tobacco Use  . Smoking status: Never    Passive exposure: Past  . Smokeless tobacco: Never  Vaping Use  . Vaping status: Never Used  Substance and Sexual Activity  . Alcohol  use: Never  . Drug use: Never  . Sexual  activity: Not Currently  Other Topics Concern  . Not on file  Social History Narrative  . Not on file   Social Drivers of Health   Financial Resource Strain: Low Risk  (10/20/2023)   Overall Financial Resource Strain (CARDIA)   . Difficulty of Paying Living Expenses: Not hard at all  Food Insecurity: Food Insecurity Present (11/16/2023)   Hunger Vital Sign   . Worried About Programme researcher, broadcasting/film/video in the Last Year: Sometimes true   . Ran Out of Food in the Last Year: Sometimes true  Transportation Needs: Unmet Transportation Needs (11/16/2023)   PRAPARE - Transportation   . Lack of Transportation (Medical): Yes   . Lack of Transportation (Non-Medical): Yes  Physical Activity: Inactive (06/03/2023)   Exercise Vital Sign   . Days of Exercise per Week: 0 days   . Minutes of Exercise per Session: 0 min  Stress: No Stress Concern Present (06/03/2023)   [employer] of Occupational Health - Occupational Stress Questionnaire   . Feeling of Stress : Only a little  Social Connections: Moderately Isolated (06/03/2023)   Social Connection and Isolation Panel [NHANES]   . Frequency of Communication with Friends and Family: More than three times a week   . Frequency of Social Gatherings with Friends and Family: Never   . Attends Religious Services: More than 4 times per year   . Active Member of Clubs or Organizations: No   . Attends Banker Meetings: Never   . Marital Status: Widowed  Intimate Partner Violence: Not At Risk (11/16/2023)   Humiliation, Afraid, Rape, and Kick questionnaire   . Fear of Current or Ex-Partner: No   . Emotionally Abused: No   . Physically Abused: No   . Sexually Abused: No    Physical Exam      Future Appointments  Date Time Provider Department Center  12/01/2023  9:00 AM Little, Skeeter Dukes, RN [facility] None  12/14/2023 11:00 AM [facility] SPEECH THERAPIST [facility]  12/14/2023 11:00 AM [facility] R/F 1 [facility] [place]  12/14/2023  1:20 PM  Carlene Che, MD [facility]  02/16/2024  9:15 AM Luella Sager, DPM [facility]  04/04/2024 11:00  AM Matt Song, MD [facility] None  05/17/2024  9:15 AM Luella Sager, DPM [facility]  06/05/2024 11:00 AM Susanna Epley, [name] [facility] None       Alfonza Angry, Paramedic [phone] [facility] Paramedic  11/30/23

## 2023-12-01 ENCOUNTER — Other Ambulatory Visit: Payer: Self-pay

## 2023-12-01 NOTE — Patient Outreach (Addendum)
 Complex Care Management   Visit Note  12/01/2023  Name:  Tracy Nelson MRN: [id number] DOB: Aug 04, 1946  Situation: Referral received for Complex Care Management related to Heart Failure, Chronic Kidney Disease, and dysphagia  I obtained verbal consent from Patient.  Visit completed with patient on the phone.  Background:   Past Medical History:  Diagnosis Date   Acid reflux 07/15/2020   Aortic insufficiency    Arthritis 07/15/2020   Atrial flutter (HCC)    Colitis 07/15/2020   Dilated cardiomyopathy (HCC) 07/15/2020   HFrEF (heart failure with reduced ejection fraction) (HCC) 07/15/2020   HTN (hypertension) 07/15/2020   Mitral regurgitation    Osteoporosis 07/15/2020   PAF (paroxysmal atrial fibrillation) (HCC)    Palpitations 07/15/2020   Rheumatoid arthritis (HCC) 07/15/2020   Tricuspid regurgitation     Assessment: Patient Reported Symptoms:  Cognitive Cognitive Status: Alert and oriented to person, place, and time      Neurological Neurological Review of Symptoms: No symptoms reported    HEENT HEENT Symptoms Reported: No symptoms reported      Cardiovascular Cardiovascular Symptoms Reported: Swelling in legs or feet, Other: Other Cardiovascular Symptoms: weakness Does patient have uncontrolled Hypertension?: No Cardiovascular Conditions: Heart failure, Cardiomyopathy Cardiovascular Management Strategies: Medication therapy, Routine screening, Diet modification, Fluid modification Weight: 93 lb 6.4 oz (42.4 kg) Cardiovascular Self-Management Outcome: 4 (good)  Respiratory Respiratory Symptoms Reported: No symptoms reported    Endocrine Patient reports the following symptoms related to hypoglycemia or hyperglycemia : No symptoms reported Is patient diabetic?: No    Gastrointestinal Gastrointestinal Symptoms Reported: Abdominal pain or discomfort, Other Additional Gastrointestinal Details: stool urgency Gastrointestinal Conditions:  Reflux/heartburn Gastrointestinal Management Strategies: Medication therapy Gastrointestinal Self-Management Outcome: 3 (uncertain) Nutrition Risk Screen (CP): Difficulty chewing/swallowing  Genitourinary   Genitourinary Conditions: Chronic kidney disease Genitourinary Management Strategies: Medication therapy Genitourinary Self-Management Outcome: 3 (uncertain) Genitourinary Comment: patient scheduled for Vascular evaluation for Peritoneal catheter  Integumentary Integumentary Symptoms Reported: Not assessed    Musculoskeletal Musculoskelatal Symptoms Reviewed: Not assessed   Falls in the past year?: No Number of falls in past year: 1 or less Was there an injury with Fall?: No Fall Risk Category Calculator: 0 Patient Fall Risk Level: Low Fall Risk    Psychosocial Psychosocial Symptoms Reported: No symptoms reported     Quality of Family Relationships: supportive      11/16/2023   12:27 PM  Depression screen PHQ 2/9  Decreased Interest 0  Down, Depressed, Hopeless 0  PHQ - 2 Score 0    Vitals:   12/01/23 0928  BP: 94/70  Pulse: 84    Medications Reviewed Today     Reviewed by Kaylene Pascal, RN (Registered Nurse) on 12/01/23 at [phone]  Med List Status: <None>   Medication Order Taking? Sig Documenting Provider Last Dose Status Informant  acetaminophen  (TYLENOL ) 500 MG tablet [id number] Yes Take 500 mg by mouth every 6 (six) hours as needed for moderate pain (pain score 4-6). [provider] Taking Active Self, Pharmacy Records  amiodarone  (PACERONE ) 200 MG tablet [id number] Yes Take 1 tablet (200 mg total) by mouth daily. Take 200 mg by mouth once daily [place], Arlice Bene, [name] Taking Active   calcitRIOL  (ROCALTROL ) 0.25 MCG capsule [id number] Yes Take 2 capsules by mouth daily. [provider] Taking Active Self, Pharmacy Records  diclofenac  Sodium (VOLTAREN ) 1 % GEL [id number] Yes Apply 2 g topically 4 (four) times daily. Susanna Epley, [name] Taking Active    dicyclomine  (BENTYL ) 10 MG capsule  [id number] Yes Take 1 capsule (10 mg total) by mouth 3 (three) times daily before meals. Melodie Spry, NP Taking Active   ELIQUIS  2.5 MG TABS tablet [id number] Yes TAKE 1 TABLET BY MOUTH TWICE A DAY Darlis Eisenmenger, MD Taking Active   Ensure [facility]) [id number]  Take 237 mLs by mouth every other day. [provider]  Active Self, Pharmacy Records  erythromycin ophthalmic ointment [id number]  SMARTSIG:In Eye(s) [provider]  Active            Med Note Vandy Genera Oct 20, 2023  4:12 PM) PRN  folic acid  (FOLVITE ) 1 MG tablet [id number] Yes TAKE 1 TABLET BY MOUTH EVERY DAY Darlis Eisenmenger, MD Taking Active   FUROSCIX  80 MG/10ML CTKT [id number] No Inject into the skin.  Patient not taking: Reported on 12/01/2023   [provider] Not Taking Active   gabapentin  (NEURONTIN ) 100 MG capsule [id number] Yes Take 1 capsule (100 mg total) by mouth daily. Susanna Epley, [name] Taking Active   hydroxypropyl methylcellulose / hypromellose (ISOPTO TEARS / GONIOVISC) 2.5 % ophthalmic solution [id number] Yes Place 1 drop into both eyes as needed for dry eyes. [provider] Taking Active Self, Pharmacy Records  methocarbamol  (ROBAXIN ) 500 MG tablet [id number] Yes TAKE 1 TABLET (500 MG TOTAL) BY MOUTH 2 (TWO) TIMES DAILY AS NEEDED FOR MUSCLE SPASMS. Melodie Spry, NP Taking Active   midodrine  (PROAMATINE ) 2.5 MG tablet [id number] Yes Take 1 tablet (2.5 mg total) by mouth 3 (three) times daily with meals. [place], Arlice Bene, [name] Taking Active   moxifloxacin (VIGAMOX) 0.5 % ophthalmic solution [id number] Yes Place 1 drop into the left eye 3 (three) times daily. [provider] Taking Active   Multiple Vitamin (MULTIVITAMIN) tablet [id number] Yes Take 1 tablet by mouth 2 (two) times a week. [provider] Taking Active Self, Pharmacy Records  omeprazole  (PRILOSEC) 40 MG capsule [id number] Yes TAKE 1 CAPSULE (40 MG TOTAL) BY MOUTH  DAILY AS NEEDED (FOR ACID REFLUX). Susanna Epley, [name] Taking Active   potassium chloride  (KLOR-CON  M) 10 MEQ tablet [id number] Yes Take 2 tablets (20 mEq total) by mouth daily. [place], Arlice Bene, [name] Taking Active   predniSONE  (DELTASONE ) 5 MG tablet [id number] Yes Take 1 tablet (5 mg total) by mouth daily with breakfast. Matt Song, MD Taking Active Self, Pharmacy Records  sodium bicarbonate  650 MG tablet [id number] Yes Take 650 mg by mouth 2 (two) times daily. [provider] Taking Active Self, Pharmacy Records  torsemide  (DEMADEX ) 20 MG tablet [id number] Yes Take 3 tablets (60 mg total) by mouth 2 (two) times daily. Elmarie Hacking, [name] Taking Active             Recommendation:   Specialty provider follow-up Swallowing Study/Speech Evaluation scheduled for 12/14/23 @11 :00 AM  Follow Up Plan:   Telephone follow up appointment date/time:  12/20/23 @12 :00 PM  Louanne Roussel RN BSN CCM [place]  [facility], [facility] Health Nurse Care Coordinator  Direct Dial: [phone] Website: [email or web address]

## 2023-12-01 NOTE — Patient Instructions (Signed)
 Visit Information  Thank you for taking time to visit with me today. Please don't hesitate to contact me if I can be of assistance to you before our next scheduled appointment.  Our next appointment is by telephone on 12/20/23 at 12:00 PM Please call the care guide team at [phone] if you need to cancel or reschedule your appointment.   Following is a copy of your care plan:   Goals Addressed             This Visit's Progress    COMPLETED: To start peritoneal dialysis       Care Coordination Interventions: See new goal      VBCI RN Care Plan related to CHF   On track    Problems:  Chronic Disease Management support and education needs related to CHF  Goal: Over the next 30 days the Patient will demonstrate a decrease CHF in exacerbations as evidenced by patient will report increased knowledge and understanding related self-management of CHF  Interventions:   Heart Failure Interventions:  (Status:  Condition stable.  Not addressed this visit.) Short Term Goal Reiterated the importance of adhering to Heart Failure Action Plan as directed  Advised patient to weigh each morning after emptying bladder Reviewed role of diuretics in prevention of fluid overload and management of heart failure Discussed and reviewed patient's recent hypotensive BP readings Determined patient was newly prescribed Midodrine  tid w/meals in which she started yesterday Educated patient regarding the indication, dosage and frequency of this medication, and discussed importance of medication adherence Discussed the importance of keeping all appointments with provider  Patient Self-Care Activities:  Attend all scheduled provider appointments Call pharmacy for medication refills 3-7 days in advance of running out of medications Call provider office for new concerns or questions  call office if I gain more than 2 pounds in one day or 5 pounds in one week track weight in diary watch for swelling in feet,  ankles and legs every day weigh myself daily  Plan:  Follow up with provider re: heart failure follow up with Dr. Mitzie Anda in June as directed Telephone follow up appointment with care management team member scheduled for:  12/20/23 @12 :00 PM          VBCI RN Care Plan related to dysphagia       Problems:  Chronic Disease Management support and education needs related to GERD  Goal: Over the next 30 days the Patient will continue to work with Medical illustrator and/or Social Worker to address care management and care coordination needs related to GERD as evidenced by adherence to care management team scheduled appointments      Interventions:   Evaluation of current treatment plan related to  GERD w/dysphagia , self-management and patient's adherence to plan as established by provider Reviewed scheduled/upcoming provider appointments including: Swallowing Study and Speech Evaluation scheduled for 12/14/23 @11 :00 AM Educated patient regarding choking/aspiration prevention, eat a soft diet, chew slowly in an upright position with each meal/snack  Discussed plans with patient for ongoing care management follow up and provided patient with direct contact information for care management team   Patient Self-Care Activities:  Attend all scheduled provider appointments Call pharmacy for medication refills 3-7 days in advance of running out of medications Call provider office for new concerns or questions  Take medications as prescribed   Eat a soft diet and chew slowly in an upright position with each meal/snack   Plan:  Follow up with provider re: swallowing  study/speech evaluation on 12/14/23 @11 :00 AM Telephone follow up appointment with care management team member scheduled for:  12/20/23 @12 :00 PM          VBCI RN Care Plan related to 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       Problems:  Chronic Disease Management support and education needs related to CKD Stage V not on  dialysis  Goal: Over the next 30 days the Patient will continue to work with Medical illustrator and/or Social Worker to address care management and care coordination needs related to CKD Stage V not on dialysis as evidenced by adherence to care management team scheduled appointments      Interventions:    Chronic Kidney Disease Interventions: Assessed the Patient understanding of chronic kidney disease    Evaluation of current treatment plan related to chronic kidney disease self management and patient's adherence to plan as established by provider      Engage patient in early, proactive and ongoing discussion about goals of care and what matters most to them    Discussed plans with patient for ongoing nurse care management follow up and provided patient with direct contact information for nurse case management  Last practice recorded BP readings:  BP Readings from Last 3 Encounters:  12/01/23 94/70  11/30/23 (!) 84/50  11/29/23 110/60   Most recent eGFR/CrCl:  Lab Results  Component Value Date   EGFR 8.0 10/19/2023    No components found for: "CRCL"  Patient Self-Care Activities:  Attend all scheduled provider appointments Call pharmacy for medication refills 3-7 days in advance of running out of medications Call provider office for new concerns or questions  Take medications as prescribed    Plan:  Follow up with provider re: evaluation for peritoneal catheter placement scheduled with Dr. Edgardo Goodwill with Vascular Surgery on 12/14/23 @1 :20 PM Telephone follow up appointment with care management team member scheduled for:  12/20/23 @12 :00 PM             Please call 1-800-273-TALK (toll free, 24 hour hotline) if you are experiencing a Mental Health or Behavioral Health Crisis or need someone to talk to.  The patient verbalized understanding of instructions, educational materials, and care plan provided today and DECLINED offer to receive copy of patient instructions, educational  materials, and care plan.   Louanne Roussel RN BSN CCM [place]  [facility], [facility] Health Nurse Care Coordinator  Direct Dial: [phone] Website: [email or web address]

## 2023-12-02 ENCOUNTER — Other Ambulatory Visit: Payer: Self-pay | Admitting: Internal Medicine

## 2023-12-02 DIAGNOSIS — M0579 Rheumatoid arthritis with rheumatoid factor of multiple sites without organ or systems involvement: Secondary | ICD-10-CM

## 2023-12-02 NOTE — Telephone Encounter (Signed)
 Last Fill: 03/30/2023  Next Visit: 04/04/2024  Last Visit: 10/06/2023  Dx: Rheumatoid arthritis involving multiple sites with positive rheumatoid factor (HCC)   Current Dose per office note on 10/06/2023: Prednisone  5mg  daily   Okay to refill Prednisone ?

## 2023-12-03 ENCOUNTER — Other Ambulatory Visit: Payer: Self-pay | Admitting: Family Medicine

## 2023-12-03 DIAGNOSIS — R109 Unspecified abdominal pain: Secondary | ICD-10-CM

## 2023-12-07 DIAGNOSIS — R636 Underweight: Secondary | ICD-10-CM | POA: Insufficient documentation

## 2023-12-07 DIAGNOSIS — R197 Diarrhea, unspecified: Secondary | ICD-10-CM | POA: Insufficient documentation

## 2023-12-07 DIAGNOSIS — G8929 Other chronic pain: Secondary | ICD-10-CM | POA: Insufficient documentation

## 2023-12-07 DIAGNOSIS — N185 Chronic kidney disease, stage 5: Secondary | ICD-10-CM | POA: Insufficient documentation

## 2023-12-07 DIAGNOSIS — R1311 Dysphagia, oral phase: Secondary | ICD-10-CM | POA: Insufficient documentation

## 2023-12-07 HISTORY — DX: Diarrhea, unspecified: R19.7

## 2023-12-08 DIAGNOSIS — I428 Other cardiomyopathies: Secondary | ICD-10-CM | POA: Diagnosis not present

## 2023-12-08 DIAGNOSIS — N2581 Secondary hyperparathyroidism of renal origin: Secondary | ICD-10-CM | POA: Diagnosis not present

## 2023-12-08 DIAGNOSIS — N185 Chronic kidney disease, stage 5: Secondary | ICD-10-CM | POA: Diagnosis not present

## 2023-12-08 DIAGNOSIS — D631 Anemia in chronic kidney disease: Secondary | ICD-10-CM | POA: Diagnosis not present

## 2023-12-08 DIAGNOSIS — M810 Age-related osteoporosis without current pathological fracture: Secondary | ICD-10-CM | POA: Diagnosis not present

## 2023-12-08 DIAGNOSIS — E872 Acidosis, unspecified: Secondary | ICD-10-CM | POA: Diagnosis not present

## 2023-12-12 NOTE — Assessment & Plan Note (Signed)

## 2023-12-12 NOTE — Assessment & Plan Note (Signed)
 She is having some difficulty with swallowing and would like to have a speech evaluation.

## 2023-12-12 NOTE — Assessment & Plan Note (Signed)
 Continue f/u with Nephrology

## 2023-12-12 NOTE — Assessment & Plan Note (Signed)
 She is describing soft stool. Advised to avoid taking any stool softners and to stay well hydrated with water.

## 2023-12-12 NOTE — Assessment & Plan Note (Signed)
 Declines shingrix, educated on disease process and is aware if he changes his mind to notify office

## 2023-12-12 NOTE — Assessment & Plan Note (Signed)
 Blood pressure is well controlled, continue current medications. Continue f/u with Cardiologist

## 2023-12-12 NOTE — Assessment & Plan Note (Signed)
 Continue f/u with Cardiologist.

## 2023-12-12 NOTE — Assessment & Plan Note (Signed)
 Would like to try pain cream to her shoulder, she does have pain with range of motion.

## 2023-12-13 NOTE — Progress Notes (Signed)
 No masses, no tumors noted. They do not recommend a follow up  Thanks

## 2023-12-14 ENCOUNTER — Ambulatory Visit (HOSPITAL_COMMUNITY): Admission: RE | Admit: 2023-12-14 | Source: Ambulatory Visit

## 2023-12-14 ENCOUNTER — Encounter: Payer: Self-pay | Admitting: Vascular Surgery

## 2023-12-14 ENCOUNTER — Ambulatory Visit
Admission: RE | Admit: 2023-12-14 | Discharge: 2023-12-14 | Disposition: A | Discharge status: Home / Self Care | Source: Ambulatory Visit | Attending: Nurse Practitioner | Admitting: Nurse Practitioner

## 2023-12-14 ENCOUNTER — Ambulatory Visit: Admitting: Vascular Surgery

## 2023-12-14 VITALS — BP 98/70 | HR 76 | Temp 97.7°F

## 2023-12-14 DIAGNOSIS — N184 Chronic kidney disease, stage 4 (severe): Secondary | ICD-10-CM

## 2023-12-14 DIAGNOSIS — R1311 Dysphagia, oral phase: Secondary | ICD-10-CM

## 2023-12-14 NOTE — Progress Notes (Signed)
 [facility] [place]  ASSESSMENT / PLAN: 78 y.o. female with CKD IV nearing ESRD. She has NYHA class IV heart failure. She is not a candidate for peritoneal dialysis catheter placement because of perioperative cardiac risk and history of open pelvic surgery. I encouraged the patient to discuss other options with her cardiology and nephrology teams.    CHIEF COMPLAINT: Deteriorating renal function  HISTORY OF PRESENT ILLNESS: Tracy Nelson is a 78 y.o. female with multiple health challenges referred to clinic for evaluation of peritoneal dialysis catheter placement.  Patient is a frail elderly woman with advanced heart failure that is highly symptomatic.  She is suffering from deteriorating renal function.  She was informed that she would not likely tolerate hemodialysis.  The patient would like to try any therapy possible to extend her life.  As such she was referred for discussion of peritoneal dialysis catheter placement.  The patient has a remote history of mini laparotomy in the suprapubic abdomen for ectopic pregnancy.  Past Medical History:  Diagnosis Date   Acid reflux 07/15/2020   Aortic insufficiency    Arthritis 07/15/2020   Atrial flutter (HCC)    Colitis 07/15/2020   Dilated cardiomyopathy (HCC) 07/15/2020   HFrEF (heart failure with reduced ejection fraction) (HCC) 07/15/2020   HTN (hypertension) 07/15/2020   Mitral regurgitation    Osteoporosis 07/15/2020   PAF (paroxysmal atrial fibrillation) (HCC)    Palpitations 07/15/2020   Rheumatoid arthritis (HCC) 07/15/2020   Tricuspid regurgitation     Past Surgical History:  Procedure Laterality Date   CARDIOVERSION N/A 11/08/2020   Procedure: CARDIOVERSION;  Surgeon: Darlis Eisenmenger, MD;  Location: [facility] ENDOSCOPY;  Service: Cardiovascular;  Laterality: N/A;   HIP FRACTURE SURGERY     INTRAMEDULLARY (IM) NAIL INTERTROCHANTERIC Left 07/10/2022   Procedure: INTRAMEDULLARY (IM) NAIL INTERTROCHANTERIC;   Surgeon: Wes Hamman, MD;  Location: [facility] OR;  Service: Orthopedics;  Laterality: Left;   RIGHT HEART CATH N/A 08/14/2020   Procedure: RIGHT HEART CATH;  Surgeon: Darlis Eisenmenger, MD;  Location[facility] INVASIVE CV LAB;  Service: Cardiovascular;  Laterality: N/A;   RIGHT HEART CATH N/A 09/03/2022   Procedure: RIGHT HEART CATH;  Surgeon: Darlis Eisenmenger, MD;  Location: [facility] INVASIVE CV LAB;  Service: Cardiovascular;  Laterality: N/A;   TEE WITHOUT CARDIOVERSION N/A 11/08/2020   Procedure: TRANSESOPHAGEAL ECHOCARDIOGRAM (TEE);  Surgeon: Darlis Eisenmenger, MD;  Location: [facility] ENDOSCOPY;  Service: Cardiovascular;  Laterality: N/A;    Family History  Problem Relation Age of Onset   Hypertension Mother    Diabetes Mother    Hypertension Father    Diabetes Father    Breast cancer Maternal Aunt    Breast cancer Paternal Aunt    Arthritis Maternal Grandmother    Lung disease Paternal Grandfather    Cancer Brother     Social History   Socioeconomic History   Marital status: Widowed    Spouse name: Not on file   Number of children: Not on file   Years of education: 16   Highest education level: Bachelor's degree (e.g., BA, AB, BS)  Occupational History   Occupation: Retired  Tobacco Use   Smoking status: Never    Passive exposure: Past   Smokeless tobacco: Never  Vaping Use   Vaping status: Never Used  Substance and Sexual Activity   Alcohol  use: Never   Drug use: Never   Sexual activity: Not Currently  Other Topics Concern   Not on file  Social  History Narrative   Not on file   Social Drivers of Health   Financial Resource Strain: Low Risk  (10/20/2023)   Overall Financial Resource Strain (CARDIA)    Difficulty of Paying Living Expenses: Not hard at all  Food Insecurity: Food Insecurity Present (11/16/2023)   Hunger Vital Sign    Worried About Running Out of Food in the Last Year: Sometimes true    Ran Out of Food in the Last Year: Sometimes true  Transportation Needs: Unmet  Transportation Needs (11/16/2023)   PRAPARE - Administrator, Civil Service (Medical): Yes    Lack of Transportation (Non-Medical): Yes  Physical Activity: Inactive (06/03/2023)   Exercise Vital Sign    Days of Exercise per Week: 0 days    Minutes of Exercise per Session: 0 min  Stress: No Stress Concern Present (06/03/2023)   [employer] of Occupational Health - Occupational Stress Questionnaire    Feeling of Stress : Only a little  Social Connections: Moderately Isolated (06/03/2023)   Social Connection and Isolation Panel [NHANES]    Frequency of Communication with Friends and Family: More than three times a week    Frequency of Social Gatherings with Friends and Family: Never    Attends Religious Services: More than 4 times per year    Active Member of [employer] or Organizations: No    Attends Banker Meetings: Never    Marital Status: Widowed  Intimate Partner Violence: Not At Risk (11/16/2023)   Humiliation, Afraid, Rape, and Kick questionnaire    Fear of Current or Ex-Partner: No    Emotionally Abused: No    Physically Abused: No    Sexually Abused: No    Allergies  Allergen Reactions   Baclofen Nausea And Vomiting   Penicillins Rash    Current Outpatient Medications  Medication Sig Dispense Refill   acetaminophen  (TYLENOL ) 500 MG tablet Take 500 mg by mouth every 6 (six) hours as needed for moderate pain (pain score 4-6).     amiodarone  (PACERONE ) 200 MG tablet Take 1 tablet (200 mg total) by mouth daily. Take 200 mg by mouth once daily 90 tablet 3   calcitRIOL  (ROCALTROL ) 0.25 MCG capsule Take 2 capsules by mouth daily.     diclofenac  Sodium (VOLTAREN ) 1 % GEL Apply 2 g topically 4 (four) times daily. 100 g 2   dicyclomine  (BENTYL ) 10 MG capsule TAKE 1 CAPSULE (10 MG TOTAL) BY MOUTH 3 (THREE) TIMES DAILY BEFORE MEALS. 90 capsule 0   ELIQUIS  2.5 MG TABS tablet TAKE 1 TABLET BY MOUTH TWICE A DAY 60 tablet 5   Ensure (ENSURE) Take 237 mLs by mouth  every other day.     erythromycin ophthalmic ointment SMARTSIG:In Eye(s)     folic acid  (FOLVITE ) 1 MG tablet TAKE 1 TABLET BY MOUTH EVERY DAY 30 tablet 11   FUROSCIX  80 MG/10ML CTKT Inject into the skin.     gabapentin  (NEURONTIN ) 100 MG capsule Take 1 capsule (100 mg total) by mouth daily. 30 capsule 2   hydroxypropyl methylcellulose / hypromellose (ISOPTO TEARS / GONIOVISC) 2.5 % ophthalmic solution Place 1 drop into both eyes as needed for dry eyes.     methocarbamol  (ROBAXIN ) 500 MG tablet TAKE 1 TABLET (500 MG TOTAL) BY MOUTH 2 (TWO) TIMES DAILY AS NEEDED FOR MUSCLE SPASMS. 30 tablet 0   midodrine  (PROAMATINE ) 2.5 MG tablet Take 1 tablet (2.5 mg total) by mouth 3 (three) times daily with meals. 100 tablet 3   moxifloxacin (  VIGAMOX) 0.5 % ophthalmic solution Place 1 drop into the left eye 3 (three) times daily.     Multiple Vitamin (MULTIVITAMIN) tablet Take 1 tablet by mouth 2 (two) times a week.     omeprazole  (PRILOSEC) 40 MG capsule TAKE 1 CAPSULE (40 MG TOTAL) BY MOUTH DAILY AS NEEDED (FOR ACID REFLUX). 90 capsule 1   potassium chloride  (KLOR-CON  M) 10 MEQ tablet Take 2 tablets (20 mEq total) by mouth daily. 60 tablet 11   predniSONE  (DELTASONE ) 5 MG tablet TAKE 1 TABLET BY MOUTH EVERY DAY WITH BREAKFAST 90 tablet 0   sodium bicarbonate  650 MG tablet Take 650 mg by mouth 2 (two) times daily.     torsemide  (DEMADEX ) 20 MG tablet Take 3 tablets (60 mg total) by mouth 2 (two) times daily. 180 tablet 3   No current facility-administered medications for this visit.    PHYSICAL EXAM Vitals:   12/14/23 1309  BP: 98/70  Pulse: 76  Temp: 97.7 F (36.5 C)  SpO2: 97%   Frail, elderly woman in a wheelchair Regular rate and rhythm Unlabored breathing Benign abdomen   PERTINENT LABORATORY AND RADIOLOGIC DATA  Most recent CBC    Latest Ref Rng & Units 08/31/2023   10:39 AM 08/17/2023   10:52 AM 07/20/2023   10:58 AM  CBC  Hemoglobin 12.0 - 15.0 g/dL 13.0  86.5  78.4      Most  recent CMP    Latest Ref Rng & Units 09/28/2023   11:28 AM 09/07/2023   12:02 PM 08/19/2023   12:33 PM  CMP  Glucose 70 - 99 mg/dL 696  295  98   BUN 8 - 23 mg/dL 284  92  132   Creatinine 0.44 - 1.00 mg/dL 4.40  1.02  7.25   Sodium 135 - 145 mmol/L 138  137  135   Potassium 3.5 - 5.1 mmol/L 3.7  4.0  4.1   Chloride 98 - 111 mmol/L 100  105  106   CO2 22 - 32 mmol/L 22  22  17    Calcium 8.9 - 10.3 mg/dL 9.6  9.6  9.2   Total Protein 6.5 - 8.1 g/dL  6.5    Total Bilirubin 0.0 - 1.2 mg/dL  0.7    Alkaline Phos 38 - 126 U/L  68    AST 15 - 41 U/L  21    ALT 0 - 44 U/L  29      Renal function CrCl cannot be calculated (Patient's most recent lab result is older than the maximum 21 days allowed.).  Hgb A1c MFr Bld (%)  Date Value  01/19/2023 5.0    LDL Chol Calc (NIH)  Date Value Ref Range Status  03/09/2022 95 0 - 99 mg/dL Final     Heber Little. Edgardo Goodwill, MD FACS [facility] Phone Number: [phone] 12/14/2023 4:46 PM   Total time spent on preparing this encounter including chart review, data review, collecting history, examining the patient, and coordinating care: 60 minutes.   Portions of this report may have been transcribed using voice recognition software.  Every effort has been made to ensure accuracy; however, inadvertent computerized transcription errors may still be present.

## 2023-12-20 ENCOUNTER — Other Ambulatory Visit: Payer: Self-pay

## 2023-12-20 NOTE — Patient Outreach (Signed)
 Complex Care Management   Visit Note  12/20/2023  Name:  Tracy Nelson MRN: [id number] DOB: August 20, 1945  Situation: Referral received for Complex Care Management related to Heart Failure and Chronic Kidney Disease stage V, approaching ESRD. I obtained verbal consent from Patient.  Visit completed with patient on the phone.  Background:   Past Medical History:  Diagnosis Date   Acid reflux 07/15/2020   Aortic insufficiency    Arthritis 07/15/2020   Atrial flutter (HCC)    Colitis 07/15/2020   Dilated cardiomyopathy (HCC) 07/15/2020   HFrEF (heart failure with reduced ejection fraction) (HCC) 07/15/2020   HTN (hypertension) 07/15/2020   Mitral regurgitation    Osteoporosis 07/15/2020   PAF (paroxysmal atrial fibrillation) (HCC)    Palpitations 07/15/2020   Rheumatoid arthritis (HCC) 07/15/2020   Tricuspid regurgitation     Assessment: Patient Reported Symptoms:  Cognitive Cognitive Status: Alert and oriented to person, place, and time Cognitive/Intellectual Conditions Management [RPT]: None reported or documented in medical history or problem list      Neurological Neurological Review of Symptoms: Not assessed    HEENT HEENT Symptoms Reported: Not assessed      Cardiovascular Cardiovascular Symptoms Reported: Swelling in legs or feet, Other: (abdominal ascites) Does patient have uncontrolled Hypertension?: No Cardiovascular Conditions: Heart failure Cardiovascular Management Strategies: Routine screening, Medication therapy Cardiovascular Self-Management Outcome: 4 (good)  Respiratory Respiratory Symptoms Reported: Not assesed    Endocrine Patient reports the following symptoms related to hypoglycemia or hyperglycemia : Not assessed    Gastrointestinal Gastrointestinal Symptoms Reported: Other, Diarrhea, Abdominal pain or discomfort Other Gastrointestinal Symptoms: dysphagia, diarrhea Gastrointestinal Conditions: Reflux/heartburn, Diarrhea, Other Other  Gastrointestinal Conditions: dysphagia Gastrointestinal Management Strategies: Medication therapy Gastrointestinal Self-Management Outcome: 3 (uncertain) Gastrointestinal Comment: provided patient with the contact number for [facility] Radiology department in order to call to reschedule her swallowing study Nutrition Risk Screen (CP): Difficulty chewing/swallowing  Genitourinary   Genitourinary Conditions: Chronic kidney disease Genitourinary Management Strategies: Medication therapy (routine screening) Genitourinary Self-Management Outcome: 3 (uncertain) Genitourinary Comment: patient is not a candidate for a peritoneal catheter at this time  Integumentary Integumentary Symptoms Reported: Not assessed    Musculoskeletal Musculoskelatal Symptoms Reviewed: Not assessed        Psychosocial Psychosocial Symptoms Reported: Not assessed            11/16/2023   12:27 PM  Depression screen PHQ 2/9  Decreased Interest 0  Down, Depressed, Hopeless 0  PHQ - 2 Score 0    There were no vitals filed for this visit.  Medications Reviewed Today     Reviewed by Kaylene Pascal, RN (Registered Nurse) on 12/20/23 at 1227  Med List Status: <None>   Medication Order Taking? Sig Documenting Provider Last Dose Status Informant  acetaminophen  (TYLENOL ) 500 MG tablet [id number] No Take 500 mg by mouth every 6 (six) hours as needed for moderate pain (pain score 4-6). [provider] Taking Active Self, Pharmacy Records  amiodarone  (PACERONE ) 200 MG tablet [id number] No Take 1 tablet (200 mg total) by mouth daily. Take 200 mg by mouth once daily [place], Arlice Bene, [name] Taking Active   calcitRIOL  (ROCALTROL ) 0.25 MCG capsule [id number] No Take 2 capsules by mouth daily. [provider] Taking Active Self, Pharmacy Records  diclofenac  Sodium (VOLTAREN ) 1 % GEL [id number] No Apply 2 g topically 4 (four) times daily. Susanna Epley, [name] Taking Active   dicyclomine  (BENTYL ) 10 MG capsule [id number] No  TAKE 1 CAPSULE (10 MG TOTAL) BY MOUTH 3 (THREE) TIMES  DAILY BEFORE MEALS. Melodie Spry, NP Taking Active   ELIQUIS  2.5 MG TABS tablet [id number] No TAKE 1 TABLET BY MOUTH TWICE A DAY Darlis Eisenmenger, MD Taking Active   Ensure [facility]) [id number] No Take 237 mLs by mouth every other day. [provider] Taking Active Self, Pharmacy Records  erythromycin ophthalmic ointment [id number] No SMARTSIG:In Eye(s) [provider] Taking Active            Med Note Vandy Genera Oct 20, 2023  4:12 PM) PRN  folic acid  (FOLVITE ) 1 MG tablet [id number] No TAKE 1 TABLET BY MOUTH EVERY DAY Darlis Eisenmenger, MD Taking Active   FUROSCIX  80 MG/10ML CTKT [id number] No Inject into the skin. [provider] Taking Active   gabapentin  (NEURONTIN ) 100 MG capsule [id number] No Take 1 capsule (100 mg total) by mouth daily. Susanna Epley, [name] Taking Active   hydroxypropyl methylcellulose / hypromellose (ISOPTO TEARS / GONIOVISC) 2.5 % ophthalmic solution [id number] No Place 1 drop into both eyes as needed for dry eyes. [provider] Taking Active Self, Pharmacy Records  methocarbamol  (ROBAXIN ) 500 MG tablet [id number] No TAKE 1 TABLET (500 MG TOTAL) BY MOUTH 2 (TWO) TIMES DAILY AS NEEDED FOR MUSCLE SPASMS. Melodie Spry, NP Taking Active   midodrine  (PROAMATINE ) 2.5 MG tablet [id number] No Take 1 tablet (2.5 mg total) by mouth 3 (three) times daily with meals. [place], Arlice Bene, [name] Taking Active   moxifloxacin (VIGAMOX) 0.5 % ophthalmic solution [id number] No Place 1 drop into the left eye 3 (three) times daily. [provider] Taking Active   Multiple Vitamin (MULTIVITAMIN) tablet [id number] No Take 1 tablet by mouth 2 (two) times a week. [provider] Taking Active Self, Pharmacy Records  omeprazole  (PRILOSEC) 40 MG capsule [id number] No TAKE 1 CAPSULE (40 MG TOTAL) BY MOUTH DAILY AS NEEDED (FOR ACID REFLUX). Susanna Epley, [name] Taking Active   potassium chloride  (KLOR-CON   M) 10 MEQ tablet [id number] No Take 2 tablets (20 mEq total) by mouth daily. [place], [place], [name] Taking Active   predniSONE  (DELTASONE ) 5 MG tablet [id number] No TAKE 1 TABLET BY MOUTH EVERY DAY WITH BREAKFAST Matt Song, MD Taking Active   sodium bicarbonate  650 MG tablet [id number] No Take 650 mg by mouth 2 (two) times daily. [provider] Taking Active Self, Pharmacy Records  torsemide  (DEMADEX ) 20 MG tablet [id number] No Take 3 tablets (60 mg total) by mouth 2 (two) times daily. [place], Arlice Bene, [name] Taking Active             Recommendation:   Specialty provider follow-up call the numbers provided to reschedule your swallowing study at [facility] outpatient Radiology; call the number provided to schedule an in person visit with Dr. Mitzie Anda around 02/02/24 as directed for evaluation of CHF   Follow Up Plan:   Telephone follow up appointment date/time:  Tuesday, June 10 at 12:00 PM  Louanne Roussel RN BSN CCM [place]  [facility], [facility] Health Nurse Care Coordinator  Direct Dial: [phone] Website: [email or web address]

## 2023-12-21 NOTE — Patient Instructions (Signed)
 Visit Information  Thank you for taking time to visit with me today. Please don't hesitate to contact me if I can be of assistance to you before our next scheduled appointment.  Your next care management appointment is by telephone on Tuesday, June 10 at 12:00 PM  Please call the care guide team at [phone] if you need to cancel, schedule, or reschedule an appointment.   Please call [phone]-TALK (toll free, 24 hour hotline) if you are experiencing a Mental Health or Behavioral Health Crisis or need someone to talk to.  Louanne Roussel RN BSN CCM [place]  [facility], [facility] Health Nurse Care Coordinator  Direct Dial: [phone] Website: [email or web address]

## 2023-12-22 ENCOUNTER — Encounter (HOSPITAL_COMMUNITY)
Admission: RE | Admit: 2023-12-22 | Discharge: 2023-12-22 | Disposition: A | Discharge status: Home / Self Care | Source: Ambulatory Visit | Attending: Nephrology | Admitting: Nephrology

## 2023-12-22 ENCOUNTER — Other Ambulatory Visit: Payer: Self-pay | Admitting: Nurse Practitioner

## 2023-12-22 ENCOUNTER — Other Ambulatory Visit (HOSPITAL_COMMUNITY): Payer: Self-pay

## 2023-12-22 VITALS — BP 103/75 | HR 75 | Temp 97.3°F | Resp 17

## 2023-12-22 DIAGNOSIS — R1311 Dysphagia, oral phase: Secondary | ICD-10-CM

## 2023-12-22 DIAGNOSIS — N183 Chronic kidney disease, stage 3 unspecified: Secondary | ICD-10-CM | POA: Insufficient documentation

## 2023-12-22 LAB — POCT HEMOGLOBIN-HEMACUE: Hemoglobin: 8.8 g/dL — ABNORMAL LOW (ref 12.0–15.0)

## 2023-12-22 MED ORDER — EPOETIN ALFA-EPBX 10000 UNIT/ML IJ SOLN
10000.0000 [IU] | INTRAMUSCULAR | Status: DC
  Administered 2023-12-22: 10000 [IU] via SUBCUTANEOUS

## 2023-12-22 MED ORDER — EPOETIN ALFA-EPBX 10000 UNIT/ML IJ SOLN
INTRAMUSCULAR | Status: AC
  Filled 2023-12-22: qty 1

## 2023-12-22 NOTE — Progress Notes (Signed)
 Done

## 2023-12-22 NOTE — Progress Notes (Signed)
 Paramedicine Encounter    [name], female    DOB: 04/25/1946, 78 y.o.   MRN: [id number]   Complaints-tired  Edema-none   Compliance with meds-reports so   Pill box filled-n/a  If so, by whom-nA   Refills needed-n/a   Pt reports that she was very dizzy last night but it was the first time she tried taking the gabapentin  for RLS, which is getting worse.  She reports her breathing is doing bad at times.  She went to speak to doctor about peritoneal dialysis and he did not feel she was candidate for her and told her no.  No next steps for further care>> palliative.  She reports improved dizziness with midodrine .  Appetite--she has to force food.  She reports that she is drinking enough fluids.  She did ask about palliative and I advised her that they are hands on and do good with symptom management.  She may speak with dr mclean about this at next visit.   She is due for heart clinic appoint on in end of June.   Still has home aide three days a week.   Will f/u in a few wks.   BP 100/62   Pulse 76   Resp 16   Wt 91 lb (41.3 kg)   SpO2 98%   BMI 15.62 kg/m  Weight yesterday-93 Last visit weight--92  Patient Care Team: Susanna Epley, [name] as PCP - General (General Practice) Denton Flakes, LCSW as Social Worker (Licensed Clinical Social Worker) Physiological scientist, [place] Dk, RPH (Inactive) (Pharmacist) Darlis Eisenmenger, MD as Consulting Physician (Cardiology) Kaylene Pascal, RN as [facility] Care Management  Patient Active Problem List   Diagnosis Date Noted   Diarrhea 12/07/2023   Underweight (BMI < 18.5) 12/07/2023   Oral phase dysphagia 12/07/2023   Chronic left shoulder pain 12/07/2023   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) 12/07/2023   Vaginal itching 11/08/2023   Hepatic cyst 11/08/2023   Abnormal finding of diagnostic imaging 11/08/2023   Abdominal cramps 11/08/2023   Pain due to onychomycosis of toenails of both feet 11/03/2023    Back muscle spasm 09/22/2023   Lower abdominal pain 09/22/2023   Upper back pain 09/22/2023   Vaginal irritation 07/28/2023   Acute clinical systolic heart failure (HCC) 07/10/2023   CHF exacerbation (HCC) 07/09/2023   Elevated troponin 07/09/2023   PAF (paroxysmal atrial fibrillation) (HCC) 06/08/2023   Influenza vaccination declined 06/08/2023   Herpes zoster vaccination declined 06/08/2023   COVID-19 vaccination declined 06/08/2023   Bumps on skin 02/03/2023   Acquired thrombophilia (HCC) 02/03/2023   Prediabetes 01/13/2023   Prolonged QT interval 10/30/2022   Cataract 09/14/2022   Uterine prolapse 09/14/2022   Pressure ulcer 09/03/2022   Acute heart failure with reduced ejection fraction (HFrEF, <= 40%) (HCC) 08/30/2022   Anemia of chronic disease 08/30/2022   Chronic systolic CHF (congestive heart failure) (HCC) 07/13/2022   CKD (chronic kidney disease), stage IV (HCC) 07/09/2022   Corneal melt, 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   Monoclonal gammopathy 03/10/2021   Atypical chest pain 10/22/2020   CKD (chronic kidney disease), stage III (HCC) 10/22/2020   Atrial flutter (HCC) 10/18/2020   Arthritis 07/15/2020   GERD (gastroesophageal reflux disease) 07/15/2020   Non-ischemic cardiomyopathy (HCC) 07/15/2020   HFrEF (heart failure with reduced ejection fraction) (HCC) 07/15/2020   HTN (hypertension) 07/15/2020   Osteoporosis 07/15/2020   Raynaud's disease 05/05/2019   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 Take 200 mg by mouth once daily, Disp: 90 tablet, Rfl: 3   calcitRIOL  (ROCALTROL ) 0.25 MCG capsule, Take 2 capsules by mouth daily., Disp: , Rfl:    diclofenac  Sodium (VOLTAREN ) 1 % GEL, Apply 2 g topically 4 (four) times daily., Disp: 100 g, Rfl: 2   dicyclomine  (BENTYL ) 10 MG capsule, TAKE 1 CAPSULE (10 MG TOTAL) BY MOUTH 3 (THREE) TIMES DAILY BEFORE MEALS., Disp: 90 capsule, Rfl: 0   ELIQUIS  2.5 MG TABS tablet, TAKE 1 TABLET BY MOUTH TWICE A DAY, Disp: 60 tablet, Rfl: 5   Ensure (ENSURE), Take 237 mLs by mouth every other day., Disp: , Rfl:    erythromycin ophthalmic ointment, SMARTSIG:In Eye(s), Disp: , Rfl:    folic acid  (FOLVITE ) 1 MG tablet, TAKE 1 TABLET BY MOUTH EVERY DAY, Disp: 30 tablet, Rfl: 11   FUROSCIX  80 MG/10ML CTKT, Inject into the skin., Disp: , Rfl:    gabapentin  (NEURONTIN ) 100 MG capsule, Take 1 capsule (100 mg total) by mouth daily., Disp: 30 capsule, Rfl: 2   hydroxypropyl methylcellulose / hypromellose (ISOPTO TEARS / GONIOVISC) 2.5 % ophthalmic solution, Place 1 drop into both eyes as needed for dry eyes., Disp: , Rfl:    methocarbamol  (ROBAXIN ) 500 MG tablet, TAKE 1 TABLET (500 MG TOTAL) BY MOUTH 2 (TWO) TIMES DAILY AS NEEDED FOR MUSCLE SPASMS., Disp: 30 tablet, Rfl: 0   midodrine  (PROAMATINE ) 2.5 MG tablet, Take 1 tablet (2.5 mg total) by mouth 3 (three) times daily with meals., Disp: 100 tablet, Rfl: 3   moxifloxacin (VIGAMOX) 0.5 % ophthalmic solution, Place 1 drop into the left eye 3 (three) times daily., Disp: , Rfl:    Multiple Vitamin (MULTIVITAMIN) tablet, Take 1 tablet by mouth 2 (two) times a week., Disp: , Rfl:    omeprazole  (PRILOSEC) 40 MG capsule, TAKE 1 CAPSULE (40 MG TOTAL) BY MOUTH DAILY AS NEEDED (FOR ACID REFLUX)., Disp: 90 capsule, Rfl: 1   potassium chloride  (KLOR-CON  M) 10 MEQ tablet, Take 2 tablets (20 mEq total) by mouth daily., Disp: 60 tablet, Rfl: 11   predniSONE  (DELTASONE ) 5 MG tablet, TAKE 1 TABLET BY MOUTH EVERY DAY WITH  BREAKFAST, Disp: 90 tablet, Rfl: 0   sodium bicarbonate  650 MG tablet, Take 650 mg by mouth 2 (two) times daily., Disp: , Rfl:    torsemide  (DEMADEX ) 20 MG tablet, Take 3 tablets (60 mg total) by mouth 2 (two) times daily., Disp: 180 tablet, Rfl: 3 No current facility-administered medications for this visit.  Facility-Administered Medications Ordered in Other Visits:    epoetin  alfa-epbx (RETACRIT ) injection 10,000 Units, 10,000 Units, Subcutaneous, Q14 Days, Nan Aver, MD, 10,000 Units at 12/22/23 1108 Allergies  Allergen Reactions   Baclofen Nausea And Vomiting   Penicillins Rash      Social History   Socioeconomic History   Marital status: Widowed    Spouse name: Not on file   Number of children: Not on file   Years of education: 16   Highest education level: Bachelor's degree (e.g., BA,  AB, BS)  Occupational History   Occupation: Retired  Tobacco Use   Smoking status: Never    Passive exposure: Past   Smokeless tobacco: Never  Vaping Use   Vaping status: Never Used  Substance and Sexual Activity   Alcohol  use: Never   Drug use: Never   Sexual activity: Not Currently  Other Topics Concern   Not on file  Social History Narrative   Not on file   Social Drivers of Health   Financial Resource Strain: Low Risk  (10/20/2023)   Overall Financial Resource Strain (CARDIA)    Difficulty of Paying Living Expenses: Not hard at all  Food Insecurity: Food Insecurity Present (11/16/2023)   Hunger Vital Sign    Worried About Running Out of Food in the Last Year: Sometimes true    Ran Out of Food in the Last Year: Sometimes true  Transportation Needs: Unmet Transportation Needs (11/16/2023)   PRAPARE - Transportation    Lack of Transportation (Medical): Yes    Lack of Transportation (Non-Medical): Yes  Physical Activity: Inactive (06/03/2023)   Exercise Vital Sign    Days of Exercise per Week: 0 days    Minutes of Exercise per Session: 0 min  Stress: No Stress Concern  Present (06/03/2023)   [employer] of Occupational Health - Occupational Stress Questionnaire    Feeling of Stress : Only a little  Social Connections: Moderately Isolated (06/03/2023)   Social Connection and Isolation Panel [NHANES]    Frequency of Communication with Friends and Family: More than three times a week    Frequency of Social Gatherings with Friends and Family: Never    Attends Religious Services: More than 4 times per year    Active Member of [employer] or Organizations: No    Attends Banker Meetings: Never    Marital Status: Widowed  Intimate Partner Violence: Not At Risk (11/16/2023)   Humiliation, Afraid, Rape, and Kick questionnaire    Fear of Current or Ex-Partner: No    Emotionally Abused: No    Physically Abused: No    Sexually Abused: No    Physical Exam      Future Appointments  Date Time Provider Department Center  01/06/2024 10:45 AM [facility] None  01/18/2024 12:00 PM Little, Skeeter Dukes, RN [facility] None  02/16/2024  9:15 AM Luella Sager, DPM [facility]  04/04/2024 11:00 AM Matt Song, MD [facility] None  05/17/2024  9:15 AM Luella Sager, DPM [facility]  06/05/2024 11:00 AM Susanna Epley, [name] [facility] None       Alfonza Angry, Paramedic [phone] [facility] Paramedic  12/22/23

## 2023-12-23 ENCOUNTER — Inpatient Hospital Stay (HOSPITAL_COMMUNITY): Admission: RE | Admit: 2023-12-23 | Source: Ambulatory Visit

## 2023-12-29 ENCOUNTER — Ambulatory Visit (HOSPITAL_COMMUNITY)
Admission: RE | Admit: 2023-12-29 | Discharge: 2023-12-29 | Disposition: A | Discharge status: Home / Self Care | Source: Ambulatory Visit | Attending: Nurse Practitioner | Admitting: Nurse Practitioner

## 2023-12-29 ENCOUNTER — Ambulatory Visit (HOSPITAL_COMMUNITY): Admission: RE | Admit: 2023-12-29 | Source: Ambulatory Visit

## 2023-12-29 DIAGNOSIS — R1311 Dysphagia, oral phase: Secondary | ICD-10-CM

## 2024-01-04 ENCOUNTER — Other Ambulatory Visit: Payer: Self-pay

## 2024-01-04 ENCOUNTER — Encounter (HOSPITAL_COMMUNITY): Payer: Self-pay

## 2024-01-04 ENCOUNTER — Emergency Department (HOSPITAL_COMMUNITY)

## 2024-01-04 ENCOUNTER — Telehealth (HOSPITAL_COMMUNITY): Payer: Self-pay | Admitting: Cardiology

## 2024-01-04 ENCOUNTER — Inpatient Hospital Stay (HOSPITAL_COMMUNITY)
Admission: EM | Admit: 2024-01-04 | Discharge: 2024-01-07 | DRG: 291 | Disposition: A | Discharge status: Hospice | Attending: Internal Medicine | Admitting: Internal Medicine

## 2024-01-04 DIAGNOSIS — I484 Atypical atrial flutter: Secondary | ICD-10-CM | POA: Diagnosis present

## 2024-01-04 DIAGNOSIS — I440 Atrioventricular block, first degree: Secondary | ICD-10-CM | POA: Diagnosis present

## 2024-01-04 DIAGNOSIS — D631 Anemia in chronic kidney disease: Secondary | ICD-10-CM | POA: Diagnosis present

## 2024-01-04 DIAGNOSIS — Z79899 Other long term (current) drug therapy: Secondary | ICD-10-CM | POA: Diagnosis not present

## 2024-01-04 DIAGNOSIS — I132 Hypertensive heart and chronic kidney disease with heart failure and with stage 5 chronic kidney disease, or end stage renal disease: Principal | ICD-10-CM | POA: Diagnosis present

## 2024-01-04 DIAGNOSIS — I509 Heart failure, unspecified: Secondary | ICD-10-CM

## 2024-01-04 DIAGNOSIS — I5084 End stage heart failure: Secondary | ICD-10-CM | POA: Diagnosis present

## 2024-01-04 DIAGNOSIS — M3500 Sicca syndrome, unspecified: Secondary | ICD-10-CM | POA: Diagnosis present

## 2024-01-04 DIAGNOSIS — Z8249 Family history of ischemic heart disease and other diseases of the circulatory system: Secondary | ICD-10-CM

## 2024-01-04 DIAGNOSIS — Z7952 Long term (current) use of systemic steroids: Secondary | ICD-10-CM

## 2024-01-04 DIAGNOSIS — I5020 Unspecified systolic (congestive) heart failure: Secondary | ICD-10-CM | POA: Diagnosis present

## 2024-01-04 DIAGNOSIS — Z7901 Long term (current) use of anticoagulants: Secondary | ICD-10-CM

## 2024-01-04 DIAGNOSIS — M810 Age-related osteoporosis without current pathological fracture: Secondary | ICD-10-CM | POA: Diagnosis present

## 2024-01-04 DIAGNOSIS — Z803 Family history of malignant neoplasm of breast: Secondary | ICD-10-CM

## 2024-01-04 DIAGNOSIS — I10 Essential (primary) hypertension: Secondary | ICD-10-CM | POA: Diagnosis present

## 2024-01-04 DIAGNOSIS — E872 Acidosis, unspecified: Secondary | ICD-10-CM | POA: Diagnosis present

## 2024-01-04 DIAGNOSIS — R54 Age-related physical debility: Secondary | ICD-10-CM | POA: Diagnosis present

## 2024-01-04 DIAGNOSIS — I480 Paroxysmal atrial fibrillation: Secondary | ICD-10-CM | POA: Diagnosis present

## 2024-01-04 DIAGNOSIS — N186 End stage renal disease: Secondary | ICD-10-CM | POA: Diagnosis present

## 2024-01-04 DIAGNOSIS — N185 Chronic kidney disease, stage 5: Secondary | ICD-10-CM | POA: Diagnosis not present

## 2024-01-04 DIAGNOSIS — E875 Hyperkalemia: Secondary | ICD-10-CM | POA: Diagnosis present

## 2024-01-04 DIAGNOSIS — I420 Dilated cardiomyopathy: Secondary | ICD-10-CM | POA: Diagnosis present

## 2024-01-04 DIAGNOSIS — M0579 Rheumatoid arthritis with rheumatoid factor of multiple sites without organ or systems involvement: Secondary | ICD-10-CM | POA: Diagnosis present

## 2024-01-04 DIAGNOSIS — N189 Chronic kidney disease, unspecified: Secondary | ICD-10-CM | POA: Diagnosis not present

## 2024-01-04 DIAGNOSIS — Z8261 Family history of arthritis: Secondary | ICD-10-CM

## 2024-01-04 DIAGNOSIS — I272 Pulmonary hypertension, unspecified: Secondary | ICD-10-CM | POA: Diagnosis present

## 2024-01-04 DIAGNOSIS — Z28310 Unvaccinated for covid-19: Secondary | ICD-10-CM

## 2024-01-04 DIAGNOSIS — I083 Combined rheumatic disorders of mitral, aortic and tricuspid valves: Secondary | ICD-10-CM | POA: Diagnosis present

## 2024-01-04 DIAGNOSIS — I7300 Raynaud's syndrome without gangrene: Secondary | ICD-10-CM | POA: Diagnosis not present

## 2024-01-04 DIAGNOSIS — R918 Other nonspecific abnormal finding of lung field: Secondary | ICD-10-CM | POA: Diagnosis not present

## 2024-01-04 DIAGNOSIS — R0602 Shortness of breath: Secondary | ICD-10-CM | POA: Diagnosis not present

## 2024-01-04 DIAGNOSIS — I517 Cardiomegaly: Secondary | ICD-10-CM | POA: Diagnosis not present

## 2024-01-04 DIAGNOSIS — Z7189 Other specified counseling: Secondary | ICD-10-CM | POA: Diagnosis not present

## 2024-01-04 DIAGNOSIS — Z515 Encounter for palliative care: Secondary | ICD-10-CM | POA: Diagnosis not present

## 2024-01-04 DIAGNOSIS — I5023 Acute on chronic systolic (congestive) heart failure: Secondary | ICD-10-CM | POA: Diagnosis present

## 2024-01-04 DIAGNOSIS — R9431 Abnormal electrocardiogram [ECG] [EKG]: Secondary | ICD-10-CM | POA: Diagnosis not present

## 2024-01-04 DIAGNOSIS — Z833 Family history of diabetes mellitus: Secondary | ICD-10-CM | POA: Diagnosis not present

## 2024-01-04 DIAGNOSIS — K219 Gastro-esophageal reflux disease without esophagitis: Secondary | ICD-10-CM | POA: Diagnosis present

## 2024-01-04 DIAGNOSIS — R4589 Other symptoms and signs involving emotional state: Secondary | ICD-10-CM | POA: Diagnosis not present

## 2024-01-04 DIAGNOSIS — N179 Acute kidney failure, unspecified: Secondary | ICD-10-CM | POA: Diagnosis present

## 2024-01-04 DIAGNOSIS — D638 Anemia in other chronic diseases classified elsewhere: Secondary | ICD-10-CM | POA: Diagnosis not present

## 2024-01-04 DIAGNOSIS — I110 Hypertensive heart disease with heart failure: Secondary | ICD-10-CM | POA: Diagnosis not present

## 2024-01-04 DIAGNOSIS — I5082 Biventricular heart failure: Secondary | ICD-10-CM | POA: Diagnosis present

## 2024-01-04 DIAGNOSIS — R1311 Dysphagia, oral phase: Secondary | ICD-10-CM | POA: Diagnosis present

## 2024-01-04 DIAGNOSIS — Z880 Allergy status to penicillin: Secondary | ICD-10-CM

## 2024-01-04 DIAGNOSIS — G2581 Restless legs syndrome: Secondary | ICD-10-CM

## 2024-01-04 DIAGNOSIS — Z888 Allergy status to other drugs, medicaments and biological substances status: Secondary | ICD-10-CM

## 2024-01-04 DIAGNOSIS — Z5982 Transportation insecurity: Secondary | ICD-10-CM

## 2024-01-04 LAB — COMPREHENSIVE METABOLIC PANEL WITH GFR
ALT: 28 U/L (ref 0–44)
AST: 17 U/L (ref 15–41)
Albumin: 3.4 g/dL — ABNORMAL LOW (ref 3.5–5.0)
Alkaline Phosphatase: 66 U/L (ref 38–126)
Anion gap: 26 — ABNORMAL HIGH (ref 5–15)
BUN: 153 mg/dL — ABNORMAL HIGH (ref 8–23)
CO2: 9 mmol/L — ABNORMAL LOW (ref 22–32)
Calcium: 9.7 mg/dL (ref 8.9–10.3)
Chloride: 107 mmol/L (ref 98–111)
Creatinine, Ser: 10.67 mg/dL — ABNORMAL HIGH (ref 0.44–1.00)
GFR, Estimated: 3 mL/min — ABNORMAL LOW (ref >60)
Glucose, Bld: 134 mg/dL — ABNORMAL HIGH (ref 70–99)
Potassium: 5.4 mmol/L — ABNORMAL HIGH (ref 3.5–5.1)
Sodium: 142 mmol/L (ref 135–145)
Total Bilirubin: 0.7 mg/dL (ref 0.0–1.2)
Total Protein: 6.5 g/dL (ref 6.5–8.1)

## 2024-01-04 LAB — CBC WITH DIFFERENTIAL/PLATELET
Abs Immature Granulocytes: 0.1 10*3/uL — ABNORMAL HIGH (ref 0.00–0.07)
Basophils Absolute: 0 10*3/uL (ref 0.0–0.1)
Basophils Relative: 0 %
Eosinophils Absolute: 0 10*3/uL (ref 0.0–0.5)
Eosinophils Relative: 0 %
HCT: 26.7 % — ABNORMAL LOW (ref 36.0–46.0)
Hemoglobin: 8.5 g/dL — ABNORMAL LOW (ref 12.0–15.0)
Immature Granulocytes: 1 %
Lymphocytes Relative: 11 %
Lymphs Abs: 0.9 10*3/uL (ref 0.7–4.0)
MCH: 29.8 pg (ref 26.0–34.0)
MCHC: 31.8 g/dL (ref 30.0–36.0)
MCV: 93.7 fL (ref 80.0–100.0)
Monocytes Absolute: 0.6 10*3/uL (ref 0.1–1.0)
Monocytes Relative: 7 %
Neutro Abs: 6.7 10*3/uL (ref 1.7–7.7)
Neutrophils Relative %: 81 %
Platelets: 189 10*3/uL (ref 150–400)
RBC: 2.85 MIL/uL — ABNORMAL LOW (ref 3.87–5.11)
RDW: 17.4 % — ABNORMAL HIGH (ref 11.5–15.5)
WBC: 8.3 10*3/uL (ref 4.0–10.5)
nRBC: 1 % — ABNORMAL HIGH (ref 0.0–0.2)

## 2024-01-04 LAB — BASIC METABOLIC PANEL WITH GFR
Anion gap: 19 — ABNORMAL HIGH (ref 5–15)
BUN: 153 mg/dL — ABNORMAL HIGH (ref 8–23)
CO2: 13 mmol/L — ABNORMAL LOW (ref 22–32)
Calcium: 9.5 mg/dL (ref 8.9–10.3)
Chloride: 106 mmol/L (ref 98–111)
Creatinine, Ser: 10.61 mg/dL — ABNORMAL HIGH (ref 0.44–1.00)
GFR, Estimated: 3 mL/min — ABNORMAL LOW (ref >60)
Glucose, Bld: 197 mg/dL — ABNORMAL HIGH (ref 70–99)
Potassium: 4.8 mmol/L (ref 3.5–5.1)
Sodium: 138 mmol/L (ref 135–145)

## 2024-01-04 LAB — BRAIN NATRIURETIC PEPTIDE: B Natriuretic Peptide: >4500 pg/mL — ABNORMAL HIGH (ref 0.0–100.0)

## 2024-01-04 LAB — TROPONIN I (HIGH SENSITIVITY)
Troponin I (High Sensitivity): 45 ng/L — ABNORMAL HIGH (ref <18)
Troponin I (High Sensitivity): 51 ng/L — ABNORMAL HIGH (ref <18)

## 2024-01-04 MED ORDER — HYPROMELLOSE (GONIOSCOPIC) 2.5 % OP SOLN
1.0000 [drp] | OPHTHALMIC | Status: DC | PRN
  Filled 2024-01-04: qty 15

## 2024-01-04 MED ORDER — ACETAMINOPHEN 650 MG RE SUPP: 650.0000 mg | Freq: Four times a day (QID) | RECTAL | Status: DC | PRN

## 2024-01-04 MED ORDER — PANTOPRAZOLE SODIUM 40 MG PO TBEC
40.0000 mg | DELAYED_RELEASE_TABLET | Freq: Every day | ORAL | Status: DC
  Administered 2024-01-05 – 2024-01-07 (×3): 40 mg via ORAL
  Filled 2024-01-04 (×3): qty 1

## 2024-01-04 MED ORDER — POLYETHYLENE GLYCOL 3350 17 G PO PACK: 17.0000 g | PACK | Freq: Every day | ORAL | Status: DC | PRN

## 2024-01-04 MED ORDER — FUROSEMIDE 10 MG/ML IJ SOLN: 120.0000 mg | Freq: Once | INTRAVENOUS | Status: DC

## 2024-01-04 MED ORDER — PREDNISONE 5 MG PO TABS
5.0000 mg | ORAL_TABLET | Freq: Every day | ORAL | Status: DC
  Administered 2024-01-05 – 2024-01-07 (×3): 5 mg via ORAL
  Filled 2024-01-04 (×3): qty 1

## 2024-01-04 MED ORDER — DICLOFENAC SODIUM 1 % EX GEL
2.0000 g | Freq: Every day | CUTANEOUS | Status: DC | PRN
  Filled 2024-01-04 (×3): qty 100

## 2024-01-04 MED ORDER — CALCITRIOL 0.5 MCG PO CAPS
0.5000 ug | ORAL_CAPSULE | Freq: Every day | ORAL | Status: DC
  Administered 2024-01-06 – 2024-01-07 (×2): 0.5 ug via ORAL
  Filled 2024-01-04 (×3): qty 1

## 2024-01-04 MED ORDER — MIDODRINE HCL 5 MG PO TABS: 2.5000 mg | ORAL_TABLET | Freq: Three times a day (TID) | ORAL | Status: DC

## 2024-01-04 MED ORDER — ACETAMINOPHEN 325 MG PO TABS: 650.0000 mg | ORAL_TABLET | Freq: Four times a day (QID) | ORAL | Status: DC | PRN

## 2024-01-04 MED ORDER — METOLAZONE 5 MG PO TABS
5.0000 mg | ORAL_TABLET | Freq: Once | ORAL | Status: AC
  Administered 2024-01-04: 5 mg via ORAL
  Filled 2024-01-04: qty 1

## 2024-01-04 MED ORDER — AMIODARONE HCL 200 MG PO TABS
200.0000 mg | ORAL_TABLET | Freq: Every day | ORAL | Status: DC
  Administered 2024-01-05 – 2024-01-07 (×3): 200 mg via ORAL
  Filled 2024-01-04 (×3): qty 1

## 2024-01-04 MED ORDER — SODIUM BICARBONATE 650 MG PO TABS
650.0000 mg | ORAL_TABLET | Freq: Two times a day (BID) | ORAL | Status: DC
  Administered 2024-01-04 – 2024-01-06 (×4): 650 mg via ORAL
  Filled 2024-01-04 (×4): qty 1

## 2024-01-04 MED ORDER — FUROSEMIDE 10 MG/ML IJ SOLN
160.0000 mg | Freq: Once | INTRAVENOUS | Status: AC
  Administered 2024-01-04: 160 mg via INTRAVENOUS
  Filled 2024-01-04: qty 16

## 2024-01-04 MED ORDER — MIDODRINE HCL 5 MG PO TABS
2.5000 mg | ORAL_TABLET | Freq: Three times a day (TID) | ORAL | Status: DC
  Administered 2024-01-04 – 2024-01-07 (×9): 2.5 mg via ORAL
  Filled 2024-01-04 (×9): qty 1

## 2024-01-04 MED ORDER — APIXABAN 2.5 MG PO TABS
2.5000 mg | ORAL_TABLET | Freq: Two times a day (BID) | ORAL | Status: DC
  Administered 2024-01-04 – 2024-01-07 (×6): 2.5 mg via ORAL
  Filled 2024-01-04 (×6): qty 1

## 2024-01-04 MED ORDER — DICYCLOMINE HCL 10 MG PO CAPS
10.0000 mg | ORAL_CAPSULE | Freq: Three times a day (TID) | ORAL | Status: DC
  Administered 2024-01-04 – 2024-01-07 (×9): 10 mg via ORAL
  Filled 2024-01-04 (×9): qty 1

## 2024-01-04 MED ORDER — SODIUM CHLORIDE 0.9% FLUSH
3.0000 mL | Freq: Two times a day (BID) | INTRAVENOUS | Status: DC
  Administered 2024-01-04 – 2024-01-07 (×6): 3 mL via INTRAVENOUS

## 2024-01-04 NOTE — Consult Note (Signed)
 Advanced Heart Failure Team Consult Note   Primary Physician: Susanna Epley, [name] Cardiologist:  None HF Cardiologist: Dr. Mitzie Anda  Reason for Consultation: End-stage CHF  HPI:    Tracy Nelson is seen today for evaluation of End-stage CHF at the request of Dr. Rufina Cough, hospital medicine.   Tracy Nelson is a 78 y.o. with history of rheumatoid arthritis, CKD stage IV, and nonischemic cardiomyopathy.  Patient has been known to have a cardiomyopathy since 2015.  Cardiac MRI 2015: EF 37% with no LGE.  Coronary angiography at that time showed no significant coronary disease.  Over the next few years, LV EF fluctuated up and down.  In 3/21, echo EF 20-25%. 11/21 CHF admission:  echo EF 20-25%, GHK, mildly decreased RV systolic function.    Admitted 3/22 with atrial fibrillation, converted to NSR on amiodarone . She went into atrial flutter after this and had TEE-DCCV in 4/22.  TEE showed EF < 20%, moderate LV dilation, mild RV dilation with moderate RV dysfunction, moderate central MR.  CPX in 3/22 showed severe HF limitation.   PYP scan 3/23 and likely negative.    She was evaluated for [facility] or ANTHEM, BNP too high and thought to be too frail.    Cardiac MRI 10/22: mild LV dilation with EF 23%, normal RV size with RVEF 37%, ECV 34%, small area of subendocardial LGE in the mid inferolateral wall and small area of full thickness LGE in the basal  inferior wall.    Admitted 07/08/22 after mechanical fall resulting in left hip fracture. S/P left intramedullary nail 07/10/22.  Post op developed AKI. Echo: EF 45%.    Admitted 1/24 with A/C systolic CHF and AKI. She required milrinone , RHC showed CI 2.81 on milrinone  0.25.  Echo EF < 20%.  Suspected end stage HF.    Echo 3/24 EF 20-25%, mild LV dilation, severely decreased RV systolic function, mild RV enlargement, severe biatrial enlargement, moderate-severe TR, moderate MR   Admitted 3/24 w/ symptomatic anemia and dyspepsia. Hgb 6.8,   transfused 2 uRBCs. GI recommended conservative management. There was no overt blood loss-FOBT was negative-gastroenterology did not feel endoscopic evaluation was warranted. She was started on Protonix . Hospital course was c/b by volume overload.    Acute visit 05/27/23, mildly volume overloaded and in atrial fibrillation with RVR. Amiodarone  increased to 200 mg bid x 1 week. She ultimately chemically converted to NSR and TEE/DCCV aborted.   Admitted 07/09/23 with A/C CHF and AKI. Diuresed with IV lasix . Scr bumped to 4.2, improved to 4.0 after diuretic held a couple of days. Palliative care consulted for GOC discussions d/t end-stage HF. She was not ready to consider hospice.  She presented to the ED today with end-stage CHF with progressive SOB, BLE edema and abd swelling that started to worsen on Sunday. Reports compliance with all medications. Reports good UOP with Torsemide . Admission labs reviewed: BNP 4.5K, SCr 10.67, BUN 153, K 5.4, HsTrop 45>51. CXR with bilateral pleural effusions. Nephrology has seen and deemed not a candidate for HD.   Resting comfortably in bed. Daughter and grandchildren at bedside. Denies CP or SOB.   Home Medications Prior to Admission medications   Medication Sig Start Date End Date Taking? Authorizing Provider  amiodarone  (PACERONE ) 200 MG tablet Take 1 tablet (200 mg total) by mouth daily. Take 200 mg by mouth once daily 11/30/23  Yes Milford, [place], [name]  calcitRIOL  (ROCALTROL ) 0.25 MCG capsule Take 2 capsules by mouth daily. 06/25/22  Yes [provider]  diclofenac  Sodium (VOLTAREN ) 1 % GEL Apply 2 g topically 4 (four) times daily. Patient taking differently: Apply 2 g topically daily as needed. 11/29/23  Yes Susanna Epley, [name]  dicyclomine  (BENTYL ) 10 MG capsule TAKE 1 CAPSULE (10 MG TOTAL) BY MOUTH 3 (THREE) TIMES DAILY BEFORE MEALS. 12/03/23  Yes Melodie Spry, NP  ELIQUIS  2.5 MG TABS tablet TAKE 1 TABLET BY MOUTH TWICE A DAY 07/19/23  Yes McLean,  Dalton S, MD  fluorometholone (FML) 0.1 % ophthalmic suspension 1 drop 4 (four) times daily. 11/29/23  Yes [provider]  folic acid  (FOLVITE ) 1 MG tablet TAKE 1 TABLET BY MOUTH EVERY DAY 08/09/23  Yes Darlis Eisenmenger, MD  gabapentin  (NEURONTIN ) 100 MG capsule Take 1 capsule (100 mg total) by mouth daily. Patient taking differently: Take 100 mg by mouth daily as needed. 11/29/23 11/28/24 Yes Moore, Janece, [name]  hydroxypropyl methylcellulose / hypromellose (ISOPTO TEARS / GONIOVISC) 2.5 % ophthalmic solution Place 1 drop into both eyes as needed for dry eyes.   Yes [provider]  midodrine  (PROAMATINE ) 2.5 MG tablet Take 1 tablet (2.5 mg total) by mouth 3 (three) times daily with meals. 09/28/23  Yes Milford, Arlice Bene, [name]  Multiple Vitamin (MULTIVITAMIN) tablet Take 1 tablet by mouth 2 (two) times a week.   Yes [provider]  omeprazole  (PRILOSEC) 40 MG capsule TAKE 1 CAPSULE (40 MG TOTAL) BY MOUTH DAILY AS NEEDED (FOR ACID REFLUX). 10/11/23  Yes Susanna Epley, [name]  potassium chloride  (KLOR-CON  M) 10 MEQ tablet Take 2 tablets (20 mEq total) by mouth daily. 11/30/23  Yes Milford, Arlice Bene, [name]  predniSONE  (DELTASONE ) 5 MG tablet TAKE 1 TABLET BY MOUTH EVERY DAY WITH BREAKFAST 12/02/23  Yes Rice, Haig Levan, MD  sodium bicarbonate  650 MG tablet Take 650 mg by mouth 2 (two) times daily. 02/23/23  Yes [provider]  torsemide  (DEMADEX ) 20 MG tablet Take 3 tablets (60 mg total) by mouth 2 (two) times daily. 10/25/23  Yes Milford, Arlice Bene, [name]  Ensure (ENSURE) Take 237 mLs by mouth every other day.    [provider]  erythromycin ophthalmic ointment SMARTSIG:In Eye(s) Patient not taking: Reported on 01/04/2024 10/05/23   [provider]  FUROSCIX  80 MG/10ML CTKT Inject into the skin. 07/20/23   [provider]  methocarbamol  (ROBAXIN ) 500 MG tablet TAKE 1 TABLET (500 MG TOTAL) BY MOUTH 2 (TWO) TIMES DAILY AS NEEDED FOR MUSCLE  SPASMS. Patient not taking: Reported on 01/04/2024 11/09/23   Melodie Spry, NP    Past Medical History: Past Medical History:  Diagnosis Date   Acid reflux 07/15/2020   Aortic insufficiency    Arthritis 07/15/2020   Atrial flutter (HCC)    Atypical chest pain 10/22/2020   Colitis 07/15/2020   COVID-19 vaccination declined 06/08/2023   Diarrhea 12/07/2023   Dilated cardiomyopathy (HCC) 07/15/2020   Herpes zoster vaccination declined 06/08/2023   HFrEF (heart failure with reduced ejection fraction) (HCC) 07/15/2020   HTN (hypertension) 07/15/2020   Mitral regurgitation    Osteoporosis 07/15/2020   PAF (paroxysmal atrial fibrillation) (HCC)    Palpitations 07/15/2020   Rheumatoid arthritis (HCC) 07/15/2020   Tricuspid regurgitation     Past Surgical History: Past Surgical History:  Procedure Laterality Date   CARDIOVERSION N/A 11/08/2020   Procedure: CARDIOVERSION;  Surgeon: Darlis Eisenmenger, MD;  Location: [facility] ENDOSCOPY;  Service: Cardiovascular;  Laterality: N/A;   HIP FRACTURE SURGERY     INTRAMEDULLARY (IM) NAIL  INTERTROCHANTERIC Left 07/10/2022   Procedure: INTRAMEDULLARY (IM) NAIL INTERTROCHANTERIC;  Surgeon: Wes Hamman, MD;  Location: [facility] OR;  Service: Orthopedics;  Laterality: Left;   RIGHT HEART CATH N/A 08/14/2020   Procedure: RIGHT HEART CATH;  Surgeon: Darlis Eisenmenger, MD;  Location: [facility] INVASIVE CV LAB;  Service: Cardiovascular;  Laterality: N/A;   RIGHT HEART CATH N/A 09/03/2022   Procedure: RIGHT HEART CATH;  Surgeon: Darlis Eisenmenger, MD;  Location: [facility] INVASIVE CV LAB;  Service: Cardiovascular;  Laterality: N/A;   TEE WITHOUT CARDIOVERSION N/A 11/08/2020   Procedure: TRANSESOPHAGEAL ECHOCARDIOGRAM (TEE);  Surgeon: Darlis Eisenmenger, MD;  Location: [facility] END[facility]COPY;  Service: Cardiovascular;  Laterality: N/A;    Family History: Family History  Problem Relation Age of Onset   Hypertension Mother    Diabetes Mother    Hypertension Father    Diabetes Father     Breast cancer Maternal Aunt    Breast cancer Paternal Aunt    Arthritis Maternal Grandmother    Lung disease Paternal Grandfather    Cancer Brother     Social History: Social History   Socioeconomic History   Marital status: Widowed    Spouse name: Not on file   Number of children: Not on file   Years of education: 16   Highest education level: Bachelor's degree (e.g., BA, AB, BS)  Occupational History   Occupation: Retired  Tobacco Use   Smoking status: Never    Passive exposure: Past   Smokeless tobacco: Never  Vaping Use   Vaping status: Never Used  Substance and Sexual Activity   Alcohol  use: Never   Drug use: Never   Sexual activity: Not Currently  Other Topics Concern   Not on file  Social History Narrative   Not on file   Social Drivers of Health   Financial Resource Strain: Low Risk  (10/20/2023)   Overall Financial Resource Strain (CARDIA)    Difficulty of Paying Living Expenses: Not hard at all  Food Insecurity: Food Insecurity Present (11/16/2023)   Hunger Vital Sign    Worried About Running Out of Food in the Last Year: Sometimes true    Ran Out of Food in the Last Year: Sometimes true  Transportation Needs: Unmet Transportation Needs (11/16/2023)   PRAPARE - Transportation    Lack of Transportation (Medical): Yes    Lack of Transportation (Non-Medical): Yes  Physical Activity: Inactive (06/03/2023)   Exercise Vital Sign    Days of Exercise per Week: 0 days    Minutes of Exercise per Session: 0 min  Stress: No Stress Concern Present (06/03/2023)   [employer] of Occupational Health - Occupational Stress Questionnaire    Feeling of Stress : Only a little  Social Connections: Moderately Isolated (06/03/2023)   Social Connection and Isolation Panel [NHANES]    Frequency of Communication with Friends and Family: More than three times a week    Frequency of Social Gatherings with Friends and Family: Never    Attends Religious Services: More than 4  times per year    Active Member of [employer] or Organizations: No    Attends Banker Meetings: Never    Marital Status: Widowed    Allergies:  Allergies  Allergen Reactions   Baclofen Nausea And Vomiting   Penicillins Rash    Objective:    Vital Signs:   Temp:  [97.6 F (36.4 C)] 97.6 F (36.4 C) (05/27 1116) Pulse Rate:  [68-72] 72 (05/27 1500) Resp:  [14-26] 23 (05/27  1500) BP: (107-135)/(74-84) 128/82 (05/27 1500) SpO2:  [100 %] 100 % (05/27 1500)    Weight change: There were no vitals filed for this visit.  Intake/Output:  No intake or output data in the 24 hours ending 01/04/24 1548    Physical Exam    General:  frail / elderly appearing.  No respiratory difficulty Neck: supple. JVD ~9 cm.  Cor: PMI nondisplaced. Regular rate & rhythm. No rubs, gallops or murmurs. Lungs: clear Abdomen: soft, nontender, nondistended. Good bowel sounds. Extremities: no cyanosis, clubbing, rash, trace BLE edema  Neuro: alert & oriented x 3. Moves all 4 extremities w/o difficulty. Affect pleasant.   Telemetry   NSR 80s (Personally reviewed)    EKG    NSR 71 bpm   Labs   Basic Metabolic Panel: Recent Labs  Lab 01/04/24 1205  NA 142  K 5.4*  CL 107  CO2 9*  GLUCOSE 134*  BUN 153*  CREATININE 10.67*  CALCIUM 9.7    Liver Function Tests: Recent Labs  Lab 01/04/24 1205  AST 17  ALT 28  ALKPHOS 66  BILITOT 0.7  PROT 6.5  ALBUMIN  3.4*   No results for input(s): "LIPASE", "AMYLASE" in the last 168 hours. No results for input(s): "AMMONIA" in the last 168 hours.  CBC: Recent Labs  Lab 01/04/24 1223  WBC 8.3  NEUTROABS 6.7  HGB 8.5*  HCT 26.7*  MCV 93.7  PLT 189    Cardiac Enzymes: No results for input(s): "CKTOTAL", "CKMB", "CKMBINDEX", "TROPONINI" in the last 168 hours.  BNP: BNP (last 3 results) Recent Labs    08/19/23 1233 09/07/23 1202 01/04/24 1223  BNP 2,990.2* 3,754.0* >4,500.0*    ProBNP (last 3 results) No results for  input(s): "PROBNP" in the last 8760 hours.   CBG: No results for input(s): "GLUCAP" in the last 168 hours.  Coagulation Studies: No results for input(s): "LABPROT", "INR" in the last 72 hours.   Imaging   DG Chest 2 View Result Date: 01/04/2024 CLINICAL DATA:  Shortness of breath. EXAM: CHEST - 2 VIEW COMPARISON:  07/09/2023. FINDINGS: Stable cardiomegaly. Mild blunting of the bilateral costophrenic angles, could reflect pleural thickening or small bilateral pleural effusions with mild bibasilar atelectasis. No overt edema, focal consolidation or pneumothorax. Dextrocurvature of the thoracic spine. No acute osseous abnormality. IMPRESSION: 1. Mild blunting of the bilateral costophrenic angles, could reflect pleural thickening or small bilateral pleural effusions, with mild bibasilar atelectasis. 2. Stable cardiomegaly. Electronically Signed   By: Mannie Seek M.D.   On: 01/04/2024 15:23     Medications:     Current Medications:   Infusions:     Patient Profile   Tracy Nelson is a 78 y.o. with history of rheumatoid arthritis, CKD stage IV, and nonischemic cardiomyopathy. Admitted again with end-stage CHF.   Assessment/Plan  1. Acute on chronic end-stage CHF: Nonischemic cardiomyopathy. Cause is uncertain, familial cardiomyopathy is a concern given nonischemic cardiomyopathy in her mother. - Echo 3/24 with EF 20-25%, mild LV dilation, severely decreased RV systolic function, mild RV enlargement, severe biatrial enlargement, moderate-severe TR, moderate MR.  - Chronically NYHA class IV.  GDMT severely limited by low BP and now ESRD. Now end-stage cardiomyopathy.  - Continue midodrine  2.5 mg tid. - Volume overloaded on exam and renal function significantly elevated. Will try to diurese but unfortunately options are very limited. Would require iHD at this time but not a candidate with frailty and end-stage CHF. Will diurese with IV lasix  160 mg x1 and  metolazone  5 mg x1.  Follow response. Would continue 160 IV BID if she remains admitted + metolazone  PRN.  - She is not a candidate for advanced therapies given end-stage heart failure, end stage renal disease and frailty. Palliative care team has been consulted and Ms.Sass would benefit from Hospice.   2. ESRD Uremia - She is followed by Dr. Yvonnie Heritage. Now with ESRD in setting of end stage HF. She would not do well with dialysis from a heart standpoint. She would be better served with hospice services. Plan has been discussed with Dr. Mitzie Anda multiple times in the past, will touch base with him again today.  - Mentation is stable.  - On midodrine .  3. Rheumatoid arthritis: No history of lung involvement. She has been on a low dose of prednisone  chronically. Now off Imuran , felt to be contributing to anemia.  - She follows with Dr. Rodell Citrin.  4. Atrial fibrillation/flutter: S/p TEE-DCCV in 4/22. She is now on amiodarone .  She was in atypical atrial flutter 10/24. She does not tolerate atrial arrhythmias. NSR on tele today - Ok to stop daily amiodarone .  - Continue Eliquis  2.5 mg bid. No bleeding issues.  5. GOC: With end-stage CHF and ESRD she would benefit from hospice. Plan discussed in depth today with her, her daughter and her grandchildren. Agreeable to hospice. Plan to go home once palliative care team has arranged home hospice services.   AHF team will sign off as we have nothing else to offer her at this time. Would stop all HF meds at discharge if hospice is the end plan. Would send home on Torsemide  100 mg BID + metolazone  2.5-5 mg daily PRN. Can keep on midodrine  2.5 mg TID as needed for comfort if palliative care team would like this.    Length of Stay: 0  Sheryl Donna, NP  01/04/2024, 3:48 PM  Advanced Heart Failure Team Pager [phone] (M-F; 7a - 5p)  Please contact [facility] for night-coverage after hours (4p -7a ) and weekends on [email or web address]

## 2024-01-04 NOTE — ED Notes (Signed)
 Lab called to run a BNP, in process

## 2024-01-04 NOTE — Consult Note (Signed)
 Palliative Medicine Inpatient Consult Note  Consulting Provider: Dr. Rufina Cough  Reason for consult:   Need for palliative medicine.  Likely need to transition to hospice given significant worsening of renal function and not a candidate for dialysis.  01/04/2024  HPI:  Per intake H&P --> Tracy Nelson is a 78 y.o. female with medical history significant of hypertension, GERD, CKD 5, paroxysmal atrial fibrillation, dysphagia, chronic systolic CHF, Sjogren syndrome, rheumatoid arthritis, Raynaud's phenomenon, QT prolongation, anemia presenting with worsening shortness of breath and edema. Palliative care has been asked to support additional goals of care conversations.   Clinical Assessment/Goals of Care:  *Please note that this is a verbal dictation therefore any spelling or grammatical errors are due to the "Dragon Medical One" system interpretation.  I have reviewed medical records including EPIC notes, labs and imaging, received report from bedside RN, assessed the patient who is resting in bed in NAD.    I met with Tracy Nelson and her daughter, Tracy Nelson to further discuss diagnosis prognosis, GOC, EOL wishes, disposition and options.   I introduced Palliative Medicine as specialized medical care for people living with serious illness. It focuses on providing relief from the symptoms and stress of a serious illness. The goal is to improve quality of life for both the patient and the family.  Medical History Review and Understanding:  Tracy Nelson and I have reviewed her past medical history significant for chronic kidney disease, rheumatoid arthritis, atrial fibrillation, heart failure, osteoporosis, and hypertension.   Social History:  Tracy Nelson shares with me that she is from [place], [place] .  She shares that she moved here to be closer to her daughter and 3 grandchildren.  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  She is a woman who remains to keep busy with grant writing and activity within the community.  She shares that she still remains active with her sorority "[employer]".  She is a woman of faith and practices within the [facility] denomination.   Functional and Nutritional State:  Preceding hospitalization Tracy Nelson lived with her daughter on her first floor in-law suite. She mobilizes throughout the home with a rollator. She shares that she is able to bathe and shower herself, dress herself, a friend helps with meal preparation, patient can feed herself. She does have a good appetite and tries to maintain a heart healthy diet   Advance Directives:  A detailed discussion was had today regarding advanced directives.  Patient shares that she would like her daughter, Tracy Nelson to be her surrogate decision maker.  She does not have a living will and feels her daughter does not know what her wishes are.   Code Status:  Concepts specific to code status, artifical feeding and hydration, continued IV antibiotics and rehospitalization was had.  The difference between a aggressive medical intervention path  and a palliative comfort care path for this patient at this time was had.   Encouraged patient/family to consider DNR/DNI status understanding evidenced based poor outcomes in similar hospitalized patient, as the cause of arrest is likely associated with advanced chronic/terminal illness rather than an easily reversible acute cardio-pulmonary event. I explained that DNR/DNI does not change the medical plan and it only comes into effect after a person has arrested (died).  It is a protective measure to keep us  from harming the patient in their last moments of life.  Tracy Nelson shares she is not ready for a DNR. I shared that at this point I worry such interventions would be futile and we would cause great harm with no benefit. I also shared my worry in the setting of patients  poor kidney function that she could potentially arrest at any point if her electrolytes (K) become notably high.   Discussion:  When I entered the room Tracy Nelson was present with Tracy Nelson - HF APP. They explained to Tracy Nelson her poor prognosis and how dialysis it not an option. IT was explained that she is at end stage of her disease. It was recommended that she transition home with hospice as early as this evening.   I was able to speak with Tracy Nelson there after. We discussed the plan for [facility] to come and speak to them. I shared the role of hospice care. I described hospice as a service for patients who have a life expectancy of 6 months or less. The goal of hospice is the preservation of dignity and quality at the end phases of life.  Under hospice care, the focus changes from curative to symptom relief.   Lamented on patients life. Discussed importance of seeing her grandchildren graduate, she has one grandson who will graduate this week.   Discussed the importance of continued conversation with family and their  medical providers regarding overall plan of care and treatment options, ensuring decisions are within the context of the patients values and GOCs.  Decision Maker: Tracy Nelson (Daughter): [phone] (Home Phone)   SUMMARY OF RECOMMENDATIONS   Full code --> Have asked the advanced HF team to speak to patient as resuscitation under her present circumstances would be of no benefit  Appreciate [facility] speaking to Tracy Nelson and her daughter in regards to hospice at home  End stage HF, HD is not an option plan to transition home with hospice - the Advanced HF team is support diuresis  Ongoing PMT support  Code Status/Advance Care Planning: FULL CODE  Palliative Prophylaxis:  Aspiration, Bowel Regimen, Delirium Protocol, Frequent Pain Assessment, Oral Care, Palliative Wound Care, and Turn Reposition  Additional Recommendations (Limitations,  Scope, Preferences): Continue current care  Psycho-social/Spiritual:  Desire for further Chaplaincy support: Yes Additional Recommendations: Education on advanced heart failure.    Prognosis:   Discharge Planning:   Vitals:   01/04/24 1630 01/04/24 1645  BP: 122/89 122/85  Pulse: 72 70  Resp:    Temp:    SpO2: 100% 100%   No intake or output data in the 24 hours ending 01/04/24 1707  Gen: Elderly AA F in NAD HEENT: moist mucous membranes CV: Regular rate and irregular rhythm  PULM:  On RA, breathing is even and nonlabored ABD: soft/nontender  EXT: No edema  Neuro: Alert and oriented x3   PPS: 20%   This conversation/these recommendations were discussed with patient primary care team, Dr. Rufina Cough ______________________________________________________ [facility] Palliative Medicine Team Team Cell Phone: [phone] Please utilize secure chat with additional questions, if there is no response within 30 minutes please call the above phone number  Total Time: 75 Billing based on MDM: High  Palliative Medicine Team providers are available by phone from 7am to 7pm daily and can be reached through the team cell phone.  Should this patient require assistance outside of these hours, please call the patient's attending physician.

## 2024-01-04 NOTE — Care Management (Signed)
 ED RNCM consulted  by Palliative.  Home Hospice was discussed and HOP liaison Lyla Samuels RN contacted. Met with daughter [name] [phone] to discuss the plan of care. Daughter reports that Cheri plans to meet with them in the am to discuss the hospice service more in detail with patient and daughter.  TOC continues to follow  patient for discharge plan coordination.

## 2024-01-04 NOTE — ED Provider Triage Note (Signed)
 Emergency Medicine Provider Triage Evaluation Note  Tracy Nelson , a 78 y.o. female  was evaluated in triage.  Pt complains of shortness of breath, leg swelling, dry cough the last couple of days.  No chest pain, no productive cough, no fevers  Review of Systems  Positive: Shortness of breath, cough Negative: Chest pain, fevers  Physical Exam  BP 109/75 (BP Location: Right Arm)   Pulse 71   Temp 97.6 F (36.4 C)   Resp (!) 22   SpO2 100%  Gen:   Awake, no distress    Resp:  Normal effort   MSK:   Moves extremities without difficulty   Other:  1+ edema to lower extremities bilaterally  Medical Decision Making  Medically screening exam initiated at 11:17 AM.  Appropriate orders placed.  LOETA HERST was informed that the remainder of the evaluation will be completed by another provider, this initial triage assessment does not replace that evaluation, and the importance of remaining in the ED until their evaluation is complete.      Hershel Los, MD 01/04/24 [phone]

## 2024-01-04 NOTE — ED Triage Notes (Signed)
 Pt c/o sob, bilateral leg and abdominal swelling, dry cough x couple of days; denies cp, no fevers; denies pain  Verbal consent given for mse

## 2024-01-04 NOTE — H&P (Signed)
 History and Physical   Tracy Nelson ZOX:[id number] DOB: [birth year] DOA: 01/04/2024  PCP: Susanna Epley, [name]   Patient coming from: Home  Chief Complaint: Shortness of breath, edema  HPI: Tracy Nelson is a 78 y.o. female with medical history significant of hypertension, GERD, CKD 5, paroxysmal atrial fibrillation, dysphagia, chronic systolic CHF, Sjogren syndrome, rheumatoid arthritis, Raynaud's phenomenon, QT prolongation, anemia presenting with worsening shortness of breath and edema.  Patient reports 2 days of worsening shortness of breath, lower extremity edema, abdominal swelling.  Also reports associated dry cough.  Has been taking home torsemide  as prescribed.  Does continue to make urine.  Denies fevers, chills, chest pain, abdominal pain, constipation, diarrhea, nausea, vomiting.  ED Course: Vital signs in the ED stable.  Lab workup significant for CMP with potassium 5.4, bicarb 9, gap 26, BUN 153, creatinine 1.67 from previous baseline of 4.5-5, glucose 134, albumin  3.4.  CBC with hemoglobin 8.5 down from 8.82 weeks ago and 10 5 months ago.  Troponin trend 45, pending.  BNP greater than 4500.  Chest x-ray pending.  Nephrology consulted and as patient is not a candidate for dialysis they recommended palliative consultation.  Review of Systems: As per HPI otherwise all other systems reviewed and are negative.  Past Medical History:  Diagnosis Date   Acid reflux 07/15/2020   Aortic insufficiency    Arthritis 07/15/2020   Atrial flutter (HCC)    Atypical chest pain 10/22/2020   Colitis 07/15/2020   COVID-19 vaccination declined 06/08/2023   Diarrhea 12/07/2023   Dilated cardiomyopathy (HCC) 07/15/2020   Herpes zoster vaccination declined 06/08/2023   HFrEF (heart failure with reduced ejection fraction) (HCC) 07/15/2020   HTN (hypertension) 07/15/2020   Mitral regurgitation    Osteoporosis 07/15/2020   PAF (paroxysmal atrial fibrillation) (HCC)    Palpitations  07/15/2020   Rheumatoid arthritis (HCC) 07/15/2020   Tricuspid regurgitation     Past Surgical History:  Procedure Laterality Date   CARDIOVERSION N/A 11/08/2020   Procedure: CARDIOVERSION;  Surgeon: Darlis Eisenmenger, MD;  Location: [facility] ENDOSCOPY;  Service: Cardiovascular;  Laterality: N/A;   HIP FRACTURE SURGERY     INTRAMEDULLARY (IM) NAIL INTERTROCHANTERIC Left 07/10/2022   Procedure: INTRAMEDULLARY (IM) NAIL INTERTROCHANTERIC;  Surgeon: Wes Hamman, MD;  Location: [facility] OR;  Service: Orthopedics;  Laterality: Left;   RIGHT HEART CATH N/A 08/14/2020   Procedure: RIGHT HEART CATH;  Surgeon: Darlis Eisenmenger, MD;  Location: [facility] INVASIVE CV LAB;  Service: Cardiovascular;  Laterality: N/A;   RIGHT HEART CATH N/A 09/03/2022   Procedure: RIGHT HEART CATH;  Surgeon: Darlis Eisenmenger, MD;  Location: [facility] INVASIVE CV LAB;  Service: Cardiovascular;  Laterality: N/A;   TEE WITHOUT CARDIOVERSION N/A 11/08/2020   Procedure: TRANSESOPHAGEAL ECHOCARDIOGRAM (TEE);  Surgeon: Darlis Eisenmenger, MD;  Location: [facility] ENDOSCOPY;  Service: Cardiovascular;  Laterality: N/A;    Social History  reports that she has never smoked. She has been exposed to tobacco smoke. She has never used smokeless tobacco. She reports that she does not drink alcohol  and does not use drugs.  Allergies  Allergen Reactions   Baclofen Nausea And Vomiting   Penicillins Rash    Family History  Problem Relation Age of Onset   Hypertension Mother    Diabetes Mother    Hypertension Father    Diabetes Father    Breast cancer Maternal Aunt    Breast cancer Paternal Aunt    Arthritis Maternal Grandmother    Lung disease Paternal Grandfather  Cancer Brother   Reviewed on admission  Prior to Admission medications   Medication Sig Start Date End Date Taking? Authorizing Provider  acetaminophen  (TYLENOL ) 500 MG tablet Take 500 mg by mouth every 6 (six) hours as needed for moderate pain (pain score 4-6).    [provider]   amiodarone  (PACERONE ) 200 MG tablet Take 1 tablet (200 mg total) by mouth daily. Take 200 mg by mouth once daily 11/30/23   Elmarie Hacking, [name]  calcitRIOL  (ROCALTROL ) 0.25 MCG capsule Take 2 capsules by mouth daily. 06/25/22   [provider]  diclofenac  Sodium (VOLTAREN ) 1 % GEL Apply 2 g topically 4 (four) times daily. 11/29/23   Moore, Janece, [name]  dicyclomine  (BENTYL ) 10 MG capsule TAKE 1 CAPSULE (10 MG TOTAL) BY MOUTH 3 (THREE) TIMES DAILY BEFORE MEALS. 12/03/23   Melodie Spry, NP  ELIQUIS  2.5 MG TABS tablet TAKE 1 TABLET BY MOUTH TWICE A DAY 07/19/23   Darlis Eisenmenger, MD  Ensure (ENSURE) Take 237 mLs by mouth every other day.    [provider]  erythromycin ophthalmic ointment SMARTSIG:In Eye(s) 10/05/23   [provider]  folic acid  (FOLVITE ) 1 MG tablet TAKE 1 TABLET BY MOUTH EVERY DAY 08/09/23   Darlis Eisenmenger, MD  FUROSCIX  80 MG/10ML CTKT Inject into the skin. 07/20/23   [provider]  gabapentin  (NEURONTIN ) 100 MG capsule Take 1 capsule (100 mg total) by mouth daily. 11/29/23 11/28/24  Moore, Janece, [name]  hydroxypropyl methylcellulose / hypromellose (ISOPTO TEARS / GONIOVISC) 2.5 % ophthalmic solution Place 1 drop into both eyes as needed for dry eyes.    [provider]  methocarbamol  (ROBAXIN ) 500 MG tablet TAKE 1 TABLET (500 MG TOTAL) BY MOUTH 2 (TWO) TIMES DAILY AS NEEDED FOR MUSCLE SPASMS. 11/09/23   Melodie Spry, NP  midodrine  (PROAMATINE ) 2.5 MG tablet Take 1 tablet (2.5 mg total) by mouth 3 (three) times daily with meals. 09/28/23   Milford, Arlice Bene, [name]  moxifloxacin (VIGAMOX) 0.5 % ophthalmic solution Place 1 drop into the left eye 3 (three) times daily. 10/05/23   [provider]  Multiple Vitamin (MULTIVITAMIN) tablet Take 1 tablet by mouth 2 (two) times a week.    [provider]  omeprazole  (PRILOSEC) 40 MG capsule TAKE 1 CAPSULE (40 MG TOTAL) BY MOUTH DAILY AS NEEDED (FOR ACID REFLUX). 10/11/23   Susanna Epley,  [name]  potassium chloride  (KLOR-CON  M) 10 MEQ tablet Take 2 tablets (20 mEq total) by mouth daily. 11/30/23   Elmarie Hacking, [name]  predniSONE  (DELTASONE ) 5 MG tablet TAKE 1 TABLET BY MOUTH EVERY DAY WITH BREAKFAST 12/02/23   Rice, Haig Levan, MD  sodium bicarbonate  650 MG tablet Take 650 mg by mouth 2 (two) times daily. 02/23/23   [provider]  torsemide  (DEMADEX ) 20 MG tablet Take 3 tablets (60 mg total) by mouth 2 (two) times daily. 10/25/23   Elmarie Hacking, [name]    Physical Exam: Vitals:   01/04/24 1345 01/04/24 1400 01/04/24 1415 01/04/24 1430  BP: 108/74  111/79 117/83  Pulse:  69 68 71  Resp:   15 17  Temp:      SpO2:  100% 100% 100%    Physical Exam Constitutional:      General: She is not in acute distress.    Appearance: Normal appearance.  HENT:     Head: Normocephalic and atraumatic.     Mouth/Throat:     Mouth: Mucous membranes are moist.  Pharynx: Oropharynx is clear.  Eyes:     Extraocular Movements: Extraocular movements intact.     Pupils: Pupils are equal, round, and reactive to light.  Cardiovascular:     Rate and Rhythm: Normal rate and regular rhythm.     Pulses: Normal pulses.     Heart sounds: Normal heart sounds.  Pulmonary:     Effort: Pulmonary effort is normal. No respiratory distress.     Breath sounds: Normal breath sounds.  Abdominal:     General: Bowel sounds are normal. There is no distension.     Palpations: Abdomen is soft.     Tenderness: There is no abdominal tenderness.  Musculoskeletal:        General: No swelling or deformity.  Skin:    General: Skin is warm and dry.  Neurological:     General: No focal deficit present.     Mental Status: Mental status is at baseline.    Labs on Admission: I have personally reviewed following labs and imaging studies  CBC: Recent Labs  Lab 01/04/24 1223  WBC 8.3  NEUTROABS 6.7  HGB 8.5*  HCT 26.7*  MCV 93.7  PLT 189    Basic Metabolic Panel: Recent Labs  Lab  01/04/24 1205  NA 142  K 5.4*  CL 107  CO2 9*  GLUCOSE 134*  BUN 153*  CREATININE 10.67*  CALCIUM 9.7    GFR: Estimated Creatinine Clearance: 2.8 mL/min (A) (by C-G formula based on SCr of 10.67 mg/dL (H)).  Liver Function Tests: Recent Labs  Lab 01/04/24 1205  AST 17  ALT 28  ALKPHOS 66  BILITOT 0.7  PROT 6.5  ALBUMIN  3.4*    Urine analysis:    Component Value Date/Time   COLORURINE YELLOW 11/25/2022 1730   APPEARANCEUR CLEAR 11/25/2022 1730   LABSPEC 1.016 11/25/2022 1730   PHURINE 5.0 11/25/2022 1730   GLUCOSEU NEGATIVE 11/25/2022 1730   HGBUR NEGATIVE 11/25/2022 1730   BILIRUBINUR NEGATIVE 11/25/2022 1730   BILIRUBINUR negative 06/04/2021 1151   KETONESUR NEGATIVE 11/25/2022 1730   PROTEINUR TRACE (A) 11/25/2022 1730   UROBILINOGEN 0.2 06/04/2021 1151   NITRITE NEGATIVE 11/25/2022 1730   LEUKOCYTESUR MODERATE (A) 11/25/2022 1730   Radiological Exams on Admission: No results found.  EKG: Independently reviewed.  Sinus rhythm at 71 bpm.  Nonspecific T wave changes.  QTc okay 469.  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 > Will get some input from heart failure as well to see if there is any benefit to diuretic trial but likely patient is approaching end-stage of her CHF and her renal disease. - Palliative medicine consult - Trend renal function and electrolytes - Trial of high-dose diuresis  ADDENDUM >  In  consultation with cardiology/CHF team plan for trial of high-dose diuresis  Acute on chronic systolic CHF > Presented with shortness of breath and edema.  Does take home torsemide .  Now with worsening renal function. > Not a candidate for dialysis and nephrology recommending  palliative evaluation. > Last echo was in 2024 with EF 20-25%, global hypokinesis, indeterminate diastolic function, severely reduced RV function, moderate MR, mild to moderate AR > Does not appear volume overloaded despite BNP>4500, may be due to worsening renal function / end of CHF. > Unclear patient will benefit from trial of Lasix  at this point.  Will get her advanced heart failure team to weigh in to ensure there is no additional benefit to trial IV Lasix  and to reassure patient that her specialist have always been. - Palliative consult as above - Appreciate heart failure team assistance - Trend renal function and electrolytes - Strict I's and O's, daily weights - Supportive care ADDENDUM >  In consultation with cardiology/CHF team plan for trial of high-dose diuresis  Hypertension - Continue with diuresis as above  GERD - Continue PPI  Paroxysmal atrial fibrillation - Continue amiodarone , Eliquis   Sjogren's syndrome RA Raynaud's - Continue home prednisone   QTc prolongation > QTc currently okay at 469 - Avoid QTc prolonging medications were able  Anemia > Stable at 8.5, 8.82 weeks ago.  Down from 10 5 months ago. - Trend CBC  DVT prophylaxis: Eliquis  Code Status:   Full, Ongoing discussions about changing this and transition to comfort care.  Family Communication:  Updated at bedside Disposition Plan:   Patient is from:  Home  Anticipated DC to:  Home versus facility  Anticipated DC date:  1 to 3 days  Anticipated DC barriers: None  Consults called:  Advanced heart failure, palliative medicine.  EDP discussed with nephrology, not following.  Admission status:  Observation,  progressive  Severity of Illness: The appropriate patient status for this patient is OBSERVATION. Observation status is judged to be reasonable and necessary in order to provide the required intensity of service to ensure the patient's safety. The patient's presenting symptoms, physical exam findings, and initial radiographic and laboratory data in the context of their medical condition is felt to place them at decreased risk for further clinical deterioration. Furthermore, it is anticipated that the patient will be medically stable for discharge from the hospital within 2 midnights of admission.    Johnetta Nab MD [facility]  How to contact the [facility] Attending or Consulting provider 7A - 7P or covering provider during after hours 7P -7A, for this patient?   Check the care team in [facility] and look for a) attending/consulting [facility] provider listed and b) the [facility] team listed Log into [email or web address] and use [facility]'s universal password to access. If you do not have the password, please contact the hospital operator. Locate the [facility] provider you are looking for under [facility] and page to a number that you can be directly reached. If you still have difficulty reaching the provider, please page the [facility] (Director on Call) for the Hospitalists listed on amion for assistance.  01/04/2024, 3:05 PM

## 2024-01-04 NOTE — Telephone Encounter (Signed)
 Patient left VM on triage line Reporting concerns of SOB, cough, ankle and belly swelling   Chart reviewed and pt currently in ER

## 2024-01-04 NOTE — ED Provider Notes (Signed)
 [place] EMERGENCY DEPARTMENT AT [facility] Provider Note   CSN: [id number] Arrival date & time: 01/04/24  1102     History  No chief complaint on file.   Tracy Nelson is a 78 y.o. female PMHx OA, CHF, HTN, RA, atrial flutter, mitral regurgitation, aortic insufficiency, tricuspid regurgitation, CKD who presents to ED concerned for progressing SOB, BL LE swelling, and abdominal swelling x2 days. Also with mild dry cough. Patient endorses compliance on Torsemide  and her other medications.   Denies fever, chest pain, nausea, vomiting, diarrhea, dysuria, hematuria, hematochezia.   HPI     Home Medications Prior to Admission medications   Medication Sig Start Date End Date Taking? Authorizing Provider  acetaminophen  (TYLENOL ) 500 MG tablet Take 500 mg by mouth every 6 (six) hours as needed for moderate pain (pain score 4-6).    [provider]  amiodarone  (PACERONE ) 200 MG tablet Take 1 tablet (200 mg total) by mouth daily. Take 200 mg by mouth once daily 11/30/23   Elmarie Hacking, [name]  calcitRIOL  (ROCALTROL ) 0.25 MCG capsule Take 2 capsules by mouth daily. 06/25/22   [provider]  diclofenac  Sodium (VOLTAREN ) 1 % GEL Apply 2 g topically 4 (four) times daily. 11/29/23   Susanna Epley, [name]  dicyclomine  (BENTYL ) 10 MG capsule TAKE 1 CAPSULE (10 MG TOTAL) BY MOUTH 3 (THREE) TIMES DAILY BEFORE MEALS. 12/03/23   Melodie Spry, NP  ELIQUIS  2.5 MG TABS tablet TAKE 1 TABLET BY MOUTH TWICE A DAY 07/19/23   Darlis Eisenmenger, MD  Ensure (ENSURE) Take 237 mLs by mouth every other day.    [provider]  erythromycin ophthalmic ointment SMARTSIG:In Eye(s) 10/05/23   [provider]  folic acid  (FOLVITE ) 1 MG tablet TAKE 1 TABLET BY MOUTH EVERY DAY 08/09/23   Darlis Eisenmenger, MD  FUROSCIX  80 MG/10ML CTKT Inject into the skin. 07/20/23   [provider]  gabapentin  (NEURONTIN ) 100 MG capsule Take 1 capsule (100 mg total) by mouth daily.  11/29/23 11/28/24  Moore, Janece, [name]  hydroxypropyl methylcellulose / hypromellose (ISOPTO TEARS / GONIOVISC) 2.5 % ophthalmic solution Place 1 drop into both eyes as needed for dry eyes.    [provider]  methocarbamol  (ROBAXIN ) 500 MG tablet TAKE 1 TABLET (500 MG TOTAL) BY MOUTH 2 (TWO) TIMES DAILY AS NEEDED FOR MUSCLE SPASMS. 11/09/23   Melodie Spry, NP  midodrine  (PROAMATINE ) 2.5 MG tablet Take 1 tablet (2.5 mg total) by mouth 3 (three) times daily with meals. 09/28/23   Milford, Arlice Bene, [name]  moxifloxacin (VIGAMOX) 0.5 % ophthalmic solution Place 1 drop into the left eye 3 (three) times daily. 10/05/23   [provider]  Multiple Vitamin (MULTIVITAMIN) tablet Take 1 tablet by mouth 2 (two) times a week.    [provider]  omeprazole  (PRILOSEC) 40 MG capsule TAKE 1 CAPSULE (40 MG TOTAL) BY MOUTH DAILY AS NEEDED (FOR ACID REFLUX). 10/11/23   Susanna Epley, [name]  potassium chloride  (KLOR-CON  M) 10 MEQ tablet Take 2 tablets (20 mEq total) by mouth daily. 11/30/23   Milford, Arlice Bene, [name]  predniSONE  (DELTASONE ) 5 MG tablet TAKE 1 TABLET BY MOUTH EVERY DAY WITH BREAKFAST 12/02/23   Rice, Haig Levan, MD  sodium bicarbonate  650 MG tablet Take 650 mg by mouth 2 (two) times daily. 02/23/23   [provider]  torsemide  (DEMADEX ) 20 MG tablet Take 3 tablets (60 mg total) by mouth 2 (two) times daily. 10/25/23   Vernia Good  M, [name]      Allergies    Baclofen and Penicillins    Review of Systems   Review of Systems  Respiratory:  Positive for shortness of breath.     Physical Exam Updated Vital Signs BP 117/83   Pulse 71   Temp 97.6 F (36.4 C)   Resp 17   SpO2 100%  Physical Exam Vitals and nursing note reviewed.  Constitutional:      General: She is not in acute distress.    Appearance: She is not toxic-appearing.  HENT:     Head: Normocephalic and atraumatic.     Mouth/Throat:     Mouth: Mucous membranes are moist.  Eyes:     General: No scleral  icterus.       Right eye: No discharge.        Left eye: No discharge.     Conjunctiva/sclera: Conjunctivae normal.  Cardiovascular:     Rate and Rhythm: Normal rate and regular rhythm.     Pulses: Normal pulses.     Heart sounds: Normal heart sounds. No murmur heard. Pulmonary:     Effort: Pulmonary effort is normal. No respiratory distress.     Breath sounds: Normal breath sounds. No wheezing, rhonchi or rales.  Abdominal:     General: Abdomen is flat. Bowel sounds are normal. There is no distension.     Palpations: Abdomen is soft. There is no mass.     Tenderness: There is no abdominal tenderness.  Musculoskeletal:     Right lower leg: No edema.     Left lower leg: No edema.  Skin:    General: Skin is warm and dry.     Findings: No rash.  Neurological:     General: No focal deficit present.     Mental Status: She is alert and oriented to person, place, and time. Mental status is at baseline.  Psychiatric:        Mood and Affect: Mood normal.        Behavior: Behavior normal.     ED Results / Procedures / Treatments   Labs (all labs ordered are listed, but only abnormal results are displayed) Labs Reviewed  COMPREHENSIVE METABOLIC PANEL WITH GFR - Abnormal; Notable for the following components:      Result Value   Potassium 5.4 (*)    CO2 9 (*)    Glucose, Bld 134 (*)    BUN 153 (*)    Creatinine, Ser 10.67 (*)    Albumin  3.4 (*)    GFR, Estimated 3 (*)    Anion gap 26 (*)    All other components within normal limits  BRAIN NATRIURETIC PEPTIDE - Abnormal; Notable for the following components:   B Natriuretic Peptide >4,500.0 (*)    All other components within normal limits  CBC WITH DIFFERENTIAL/PLATELET - Abnormal; Notable for the following components:   RBC 2.85 (*)    Hemoglobin 8.5 (*)    HCT 26.7 (*)    RDW 17.4 (*)    nRBC 1.0 (*)    Abs Immature Granulocytes 0.10 (*)    All other components within normal limits  TROPONIN I (HIGH SENSITIVITY) -  Abnormal; Notable for the following components:   Troponin I (High Sensitivity) 45 (*)    All other components within normal limits  CBC WITH DIFFERENTIAL/PLATELET  TROPONIN I (HIGH SENSITIVITY)    EKG None  Radiology No results found.  Procedures .Critical Care  Performed by: Carpio Bureau, PA-C  Authorized by: [place] Bureau, PA-C   Critical care provider statement:    Critical care time (minutes):  30   Critical care was necessary to treat or prevent imminent or life-threatening deterioration of the following conditions:  Renal failure   Critical care was time spent personally by me on the following activities:  Development of treatment plan with patient or surrogate, discussions with consultants, evaluation of patient's response to treatment, examination of patient, ordering and review of laboratory studies, ordering and review of radiographic studies, ordering and performing treatments and interventions, pulse oximetry, re-evaluation of patient's condition and review of old charts   Care discussed with: admitting provider   Comments:     AKI and CHF exacerbation requiring hospital admission     Medications Ordered in ED Medications - No data to display  ED Course/ Medical Decision Making/ A&P                                 Medical Decision Making  This patient presents to the ED for concern of shortness of breath, this involves an extensive number of treatment options, and is a complaint that carries with it a high risk of complications and morbidity.  The differential diagnosis includes Anxiety, Anaphylaxis/Angioedema, Aspirated FB, Arrhythmia, CHF, Asthma, COPD, PNA, COVID/Flu/RSV, STEMI, Tamponade, TPNX, Sepsis   Co morbidities that complicate the patient evaluation  OA, CHF, HTN, RA, atrial flutter, mitral regurgitation, aortic insufficiency, tricuspid regurgitation, CKD    Additional history obtained:  10/2022 ECHO: 20-25% EF   Problem List / ED  Course / Critical interventions / Medication management  Patient presents to ED concern for progressing SOB, leg swelling, abdominal swelling x 2 days.  Physical exam reassuring.  Patient afebrile with stable vitals. I Ordered, and personally interpreted labs.  CBC with anemia with hemoglobin 8.5.  No leukocytosis.  BNP >4500.  CMP with hyperkalemia at 5.4.  BUN/creatinine elevated past patient's baseline currently at 153/10.67.  Troponin elevated at 45.  Repeat troponin pending. The patient was maintained on a cardiac monitor.  I personally viewed and interpreted the cardiac monitored which showed an underlying rhythm of: Sinus rhythm I ordered imaging studies including chest xray to assess for process contributing to patient's symptoms.  This imaging is pending. I requested consultation with the nephrologist on-call Dr. Christianne Cowper,  and discussed lab and imaging findings as well as pertinent plan - they recommend: admit inpatient for palliative consult and hospice set up. Symptomatic control.  Consulted with Dr. Rufina Cough who agrees to admit patient.  I have reviewed the patients home medicines and have made adjustments as needed  Social Determinants of Health:  geriatric          Final Clinical Impression(s) / ED Diagnoses Final diagnoses:  AKI (acute kidney injury) (HCC)  Acute on chronic congestive heart failure, unspecified heart failure type [facility])    Rx / DC Orders ED Discharge Orders     None         [place] Bureau, [place] 01/04/24 1508    Sallyanne Creamer, DO 01/05/24 1611

## 2024-01-05 DIAGNOSIS — Z7189 Other specified counseling: Secondary | ICD-10-CM | POA: Diagnosis not present

## 2024-01-05 DIAGNOSIS — I083 Combined rheumatic disorders of mitral, aortic and tricuspid valves: Secondary | ICD-10-CM | POA: Diagnosis present

## 2024-01-05 DIAGNOSIS — I5082 Biventricular heart failure: Secondary | ICD-10-CM | POA: Diagnosis present

## 2024-01-05 DIAGNOSIS — E875 Hyperkalemia: Secondary | ICD-10-CM | POA: Diagnosis present

## 2024-01-05 DIAGNOSIS — R4589 Other symptoms and signs involving emotional state: Secondary | ICD-10-CM

## 2024-01-05 DIAGNOSIS — Z79899 Other long term (current) drug therapy: Secondary | ICD-10-CM | POA: Diagnosis not present

## 2024-01-05 DIAGNOSIS — M0579 Rheumatoid arthritis with rheumatoid factor of multiple sites without organ or systems involvement: Secondary | ICD-10-CM | POA: Diagnosis present

## 2024-01-05 DIAGNOSIS — Z8249 Family history of ischemic heart disease and other diseases of the circulatory system: Secondary | ICD-10-CM | POA: Diagnosis not present

## 2024-01-05 DIAGNOSIS — I10 Essential (primary) hypertension: Secondary | ICD-10-CM | POA: Diagnosis not present

## 2024-01-05 DIAGNOSIS — Z7901 Long term (current) use of anticoagulants: Secondary | ICD-10-CM | POA: Diagnosis not present

## 2024-01-05 DIAGNOSIS — I509 Heart failure, unspecified: Secondary | ICD-10-CM | POA: Insufficient documentation

## 2024-01-05 DIAGNOSIS — E872 Acidosis, unspecified: Secondary | ICD-10-CM | POA: Diagnosis present

## 2024-01-05 DIAGNOSIS — Z7952 Long term (current) use of systemic steroids: Secondary | ICD-10-CM | POA: Diagnosis not present

## 2024-01-05 DIAGNOSIS — N179 Acute kidney failure, unspecified: Secondary | ICD-10-CM | POA: Diagnosis present

## 2024-01-05 DIAGNOSIS — K219 Gastro-esophageal reflux disease without esophagitis: Secondary | ICD-10-CM | POA: Diagnosis present

## 2024-01-05 DIAGNOSIS — D631 Anemia in chronic kidney disease: Secondary | ICD-10-CM | POA: Diagnosis present

## 2024-01-05 DIAGNOSIS — I5084 End stage heart failure: Secondary | ICD-10-CM | POA: Diagnosis present

## 2024-01-05 DIAGNOSIS — Z833 Family history of diabetes mellitus: Secondary | ICD-10-CM | POA: Diagnosis not present

## 2024-01-05 DIAGNOSIS — I272 Pulmonary hypertension, unspecified: Secondary | ICD-10-CM | POA: Diagnosis present

## 2024-01-05 DIAGNOSIS — I480 Paroxysmal atrial fibrillation: Secondary | ICD-10-CM | POA: Diagnosis present

## 2024-01-05 DIAGNOSIS — M3500 Sicca syndrome, unspecified: Secondary | ICD-10-CM | POA: Diagnosis present

## 2024-01-05 DIAGNOSIS — I440 Atrioventricular block, first degree: Secondary | ICD-10-CM | POA: Diagnosis present

## 2024-01-05 DIAGNOSIS — I5023 Acute on chronic systolic (congestive) heart failure: Secondary | ICD-10-CM | POA: Diagnosis present

## 2024-01-05 DIAGNOSIS — I420 Dilated cardiomyopathy: Secondary | ICD-10-CM | POA: Diagnosis present

## 2024-01-05 DIAGNOSIS — N186 End stage renal disease: Secondary | ICD-10-CM | POA: Diagnosis present

## 2024-01-05 DIAGNOSIS — Z515 Encounter for palliative care: Secondary | ICD-10-CM | POA: Diagnosis not present

## 2024-01-05 DIAGNOSIS — I484 Atypical atrial flutter: Secondary | ICD-10-CM | POA: Diagnosis present

## 2024-01-05 DIAGNOSIS — N189 Chronic kidney disease, unspecified: Secondary | ICD-10-CM

## 2024-01-05 DIAGNOSIS — I132 Hypertensive heart and chronic kidney disease with heart failure and with stage 5 chronic kidney disease, or end stage renal disease: Secondary | ICD-10-CM | POA: Diagnosis present

## 2024-01-05 LAB — COMPREHENSIVE METABOLIC PANEL WITH GFR
ALT: 23 U/L (ref 0–44)
AST: 16 U/L (ref 15–41)
Albumin: 3.2 g/dL — ABNORMAL LOW (ref 3.5–5.0)
Alkaline Phosphatase: 61 U/L (ref 38–126)
Anion gap: 16 — ABNORMAL HIGH (ref 5–15)
BUN: 156 mg/dL — ABNORMAL HIGH (ref 8–23)
CO2: 14 mmol/L — ABNORMAL LOW (ref 22–32)
Calcium: 9.2 mg/dL (ref 8.9–10.3)
Chloride: 109 mmol/L (ref 98–111)
Creatinine, Ser: 10.86 mg/dL — ABNORMAL HIGH (ref 0.44–1.00)
GFR, Estimated: 3 mL/min — ABNORMAL LOW (ref >60)
Glucose, Bld: 100 mg/dL — ABNORMAL HIGH (ref 70–99)
Potassium: 4.6 mmol/L (ref 3.5–5.1)
Sodium: 139 mmol/L (ref 135–145)
Total Bilirubin: 0.8 mg/dL (ref 0.0–1.2)
Total Protein: 6.1 g/dL — ABNORMAL LOW (ref 6.5–8.1)

## 2024-01-05 LAB — CBC
HCT: 26.8 % — ABNORMAL LOW (ref 36.0–46.0)
Hemoglobin: 8.3 g/dL — ABNORMAL LOW (ref 12.0–15.0)
MCH: 29.1 pg (ref 26.0–34.0)
MCHC: 31 g/dL (ref 30.0–36.0)
MCV: 94 fL (ref 80.0–100.0)
Platelets: 175 10*3/uL (ref 150–400)
RBC: 2.85 MIL/uL — ABNORMAL LOW (ref 3.87–5.11)
RDW: 17.7 % — ABNORMAL HIGH (ref 11.5–15.5)
WBC: 9.4 10*3/uL (ref 4.0–10.5)
nRBC: 1.5 % — ABNORMAL HIGH (ref 0.0–0.2)

## 2024-01-05 MED ORDER — MELATONIN 3 MG PO TABS
3.0000 mg | ORAL_TABLET | Freq: Every evening | ORAL | Status: DC | PRN
  Administered 2024-01-05 (×2): 3 mg via ORAL
  Filled 2024-01-05 (×2): qty 1

## 2024-01-05 MED ORDER — MENTHOL 3 MG MT LOZG
1.0000 | LOZENGE | OROMUCOSAL | Status: DC | PRN
  Administered 2024-01-05: 3 mg via ORAL
  Filled 2024-01-05: qty 9

## 2024-01-05 MED ORDER — BENZONATATE 100 MG PO CAPS
100.0000 mg | ORAL_CAPSULE | Freq: Three times a day (TID) | ORAL | Status: DC | PRN
  Administered 2024-01-05 – 2024-01-06 (×3): 100 mg via ORAL
  Filled 2024-01-05 (×3): qty 1

## 2024-01-05 NOTE — Progress Notes (Signed)
   Palliative Medicine Inpatient Follow Up Note HPI: Tracy Nelson is a 78 y.o. female with medical history significant of hypertension, GERD, CKD 5, paroxysmal atrial fibrillation, dysphagia, chronic systolic CHF, Sjogren syndrome, rheumatoid arthritis, Raynaud's phenomenon, QT prolongation, anemia presenting with worsening shortness of breath and edema. Palliative care has been asked to support additional goals of care conversations.   Today's Discussion 01/05/2024  *Please note that this is a verbal dictation therefore any spelling or grammatical errors are due to the "Dragon Medical One" system interpretation.  Chart reviewed inclusive of vital signs, progress notes, laboratory results, and diagnostic images.   I met with Tracy Nelson this morning. She is awake and fully oriented. She is resting calmly completing her breakfast. She and I discussed that it has been a tough 24 hours for her. She is trying to come to terms with the present situation and how she will not be there to see her grandchildren graduate from college and high school. We reflected on the importance of them in her life. We discussed her writing them letters that they can open on monumental moments in their life so that she remains to feel involved.   Created space and opportunity for patient to explore thoughts feelings and fears regarding current medical situation. She notes her biggest worries and the family she will be leaving behind. Allowed her time and grace to review this.   We discussed resuscitation status. Tracy Nelson is not presently ready to be a DNR but shares she is "half way there".  Discussed the plan for Tracy Nelson to transition home with hospice care. We reviewed that [facility] is meeting with Tracy Nelson this morning.   Questions and concerns addressed/Palliative Support Provided.   Objective Assessment: Vital Signs Vitals:   01/05/24 0700 01/05/24 0800  BP: 113/80 114/77  Pulse: 71 71  Resp: 19 19   Temp:  97.6 F (36.4 C)  SpO2: 100% 100%   No intake or output data in the 24 hours ending 01/05/24 1045  Gen: Elderly AA F in NAD HEENT: moist mucous membranes CV: Regular rate and irregular rhythm  PULM:  On RA, breathing is even and nonlabored ABD: soft/nontender  EXT: No edema  Neuro: Alert and oriented x3   SUMMARY OF RECOMMENDATIONS   Full code --> Patient shares she is getting there and presently is "halfway" in regards to pursuing versus not pursuing CPR   Appreciate [facility] speaking to Tracy Nelson and her daughter in regards to hospice at home --> They plan to come this morning   End stage HF, HD is not an option plan to transition home with hospice --> The Advanced HF team is support diuresis   Ongoing PMT support ______________________________________________________________________________________ [facility] [place] Palliative Medicine Team Team Cell Phone: [phone] Please utilize secure chat with additional questions, if there is no response within 30 minutes please call the above phone number  Time Spent: 62 Billing based on MDM: High  Palliative Medicine Team providers are available by phone from 7am to 7pm daily and can be reached through the team cell phone.  Should this patient require assistance outside of these hours, please call the patient's attending physician.

## 2024-01-05 NOTE — Progress Notes (Signed)
 Pt has a denuded area to right upper thigh.  Her heels are red no opened area, however the rt heel looks like it has a healed blister.  Rt hip is re measuring 3.2x5 area covered with foam dressing and left side back has a red area also measuring 5x3. Sacrum redness, but blanchable. Protectives foam dressings placed on all reddened areas.

## 2024-01-05 NOTE — Progress Notes (Signed)
   I have met with pt at bedside and spoke to daughter last evening over phone for lengthy discussion regarding hospice services. The pt questions were answered to the best of my ability. She does agree to go home with hospice car and focus on comfort. She would like to have a conversation with her daughter prior to committing to a DNR.  I have updated Camille CM in ED of conversation. I have updated the daughter as well and she is in agreement with hospice care as long as her mother agreed to this.    Lyla Samuels RN [phone]

## 2024-01-05 NOTE — Assessment & Plan Note (Signed)
 Patient on amiodarone  for rate control and anticoagulation with apixaban .

## 2024-01-05 NOTE — Progress Notes (Signed)
 Heart Failure Navigator Progress Note  Assessed for Heart & Vascular TOC clinic readiness.  Patient does not meet criteria due to Advanced Heart Failure Team patient , Plan per notes to move forward with Hospice care. No HF TOC. .   Navigator will sign off at this time.   Randie Bustle, BSN, Scientist, clinical (histocompatibility and immunogenetics) Only

## 2024-01-05 NOTE — Assessment & Plan Note (Signed)
 Continue pantoprazole.

## 2024-01-05 NOTE — Progress Notes (Signed)
  Progress Note   Patient: Tracy Nelson YNW:[id number] DOB: [birth year] DOA: 01/04/2024     0 DOS: the patient was seen and examined on 01/05/2024   Brief hospital course: Tracy Nelson was admitted to the hospital with the working diagnosis of worsening renal failure   78 yo female with the past medical history of hypertension, GERD, CKD stage 5, paroxysmal atrial fibrillation, Sjogren syndrome, who presented with dyspnea and edema. Reported 2 days of progressive generalized edema, despite taking oral diuretic therapy at home.   Assessment and Plan: * Acute on chronic systolic (congestive) heart failure (HCC) Echocardiogram with reduced LV systolic function with EF 20 to 25%, global hypokinesis, mild LV cavity dilatation, RV systolic function with severe reduction, mild enlarged RV cavity, LA and RA with severe dilatation, moderate mitral regurgitation, moderate to severe tricuspid valve regurgitation, mild to moderate aortic regurgitation.  RVSP 34.,8 mmHg.   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  Palliative diuresis.  Follow up with palliative care and hospice services.    Acute kidney injury superimposed on chronic kidney disease (HCC) CKD stage 5, now progressed to ESRD. Anion gap metabolic acidosis.   BUN is 156 with cr at 10,8 and serum bicarbonate at 14.  Na 139   Poor prognosis, palliative diuresis as tolerated. Follow up with palliative care and hospice services.   Anemia of chronic renal disease. Cell count has been stable  Metabolic bone disease, on calcitriol .   PAF (paroxysmal atrial fibrillation) (HCC) Patient on amiodarone  for rate control and anticoagulation with apixaban .   HTN (hypertension) Continue blood pressure monitoring   Rheumatoid arthritis involving multiple sites with positive rheumatoid factor (HCC) Patient on chronic prednisone .  No  acute flare   GERD (gastroesophageal reflux disease) Continue pantoprazole .   Sjogren's syndrome (HCC) Symptoms are currently controlled.         Subjective: patient continue very weak and deconditioned, continue to have dyspnea with minimal efforts, no chest pain.   Physical Exam: Vitals:   01/05/24 0400 01/05/24 0412 01/05/24 0500 01/05/24 0600  BP: 111/79  115/79 112/79  Pulse: 70  69 73  Resp: 15  20 20   Temp:  97.8 F (36.6 C)    TempSrc:  Oral    SpO2: 100%  100% 100%   Neurology awake and alert ENT with positive pallor with no icterus Cardiovascular with S1 and S2 present and regular with positive systolic murmur at the right lower sternal border.  Positive JVD Positive lower extremity edema Respiratory with rales bilaterally with no wheezing or rhonchi  Abdomen with no distention  Data Reviewed:    Family Communication: no family at the bedside   Disposition: Status is: Observation The patient will require care spanning > 2 midnights and should be moved to inpatient because: IV diuresis,   Planned Discharge Destination: Home     Author: Albertus Alt, MD 01/05/2024 7:52 AM  For on call review [email or web address].

## 2024-01-05 NOTE — Discharge Planning (Signed)
 Transition of Care [facility]) - Emergency Department Mini Assessment   Patient Details  Name: Tracy Nelson MRN: [id number] Date of Birth: 1945-11-09  Transition of Care [facility]) CM/SW Contact:    Joanette Moynahan, RN Phone Number: 01/05/2024, 8:42 AM   Clinical Narrative: RNCM met  with pt at bedside regarding discharge planning.  TOC following for discharge needs. Isobella Ascher J. Rachel Budds, BSN,  RN, [place] [phone]    ED Mini Assessment:    Barriers to Discharge: Other (must enter comment) (Palliative Care consult)     Means of departure: Car       Patient Contact and Communications     Spoke with: patient  ,          Patient states their goals for this hospitalization and ongoing recovery are:: "I'm soing to see what hospice is recommending, then hopefully go home."      Admission diagnosis:  Acute on chronic systolic (congestive) heart failure (HCC) [I50.23] Patient Active Problem List   Diagnosis Date Noted   Acute kidney injury superimposed on chronic kidney disease (HCC) 01/05/2024   Acute on chronic systolic (congestive) heart failure (HCC) 01/04/2024   Underweight (BMI < 18.5) 12/07/2023   Oral phase dysphagia 12/07/2023   Chronic left shoulder pain 12/07/2023   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) 12/07/2023   Vaginal itching 11/08/2023   Hepatic cyst 11/08/2023   Abdominal cramps 11/08/2023   Pain due to onychomycosis of toenails of both feet 11/03/2023   Back muscle spasm 09/22/2023   Lower abdominal pain 09/22/2023   Upper back pain 09/22/2023   Vaginal irritation 07/28/2023   PAF (paroxysmal atrial fibrillation) (HCC) 06/08/2023   Influenza vaccination declined 06/08/2023   Acquired thrombophilia (HCC) 02/03/2023   Prediabetes 01/13/2023   Prolonged QT interval 10/30/2022   Cataract 09/14/2022   Uterine prolapse 09/14/2022   Pressure ulcer 09/03/2022   Anemia of chronic disease 08/30/2022   Corneal melt, 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    Monoclonal gammopathy 03/10/2021   Atrial flutter (HCC) 10/18/2020   Arthritis 07/15/2020   GERD (gastroesophageal reflux disease) 07/15/2020   Non-ischemic cardiomyopathy (HCC) 07/15/2020   HFrEF (heart failure with reduced ejection fraction) (HCC) 07/15/2020   HTN (hypertension) 07/15/2020   Osteoporosis 07/15/2020   Raynaud's disease 05/05/2019   Rheumatoid arthritis involving multiple sites with positive rheumatoid factor (HCC) 10/28/2015   Mitral regurgitation 05/30/2014   Vitamin D  deficiency 05/15/2013   Sjogren's syndrome (HCC) 12/14/2008   PCP:  Susanna Epley, [name] Pharmacy:   [id number] [address] [place] - [address] [place] [address] Phone: [phone] Fax: [phone]  [facility] [address] [place] [address] Phone: [phone] Fax: [phone]  [facility] [address] [place] [address] Phone: [phone] Fax: [phone]

## 2024-01-05 NOTE — Care Management Obs Status (Signed)
 MEDICARE OBSERVATION STATUS NOTIFICATION   Patient Details  Name: Tracy Nelson MRN: [id number] Date of Birth: [birth year]   Medicare Observation Status Notification Given:  Yes    Joanette Moynahan, RN 01/05/2024, 8:16 AM

## 2024-01-05 NOTE — Assessment & Plan Note (Addendum)
 CKD stage 5, now progressed to ESRD. Anion gap metabolic acidosis.   Continue to worsen renal function with serum cr at 11 with K at 4,5 and serum bicarbonate at 15  Na 139  P > 30  Anion gap 19   Poor prognosis, palliative diuresis as tolerated. Follow up with palliative care and hospice services.   Anemia of chronic renal disease. Cell count has been stable  Metabolic bone disease, on calcitriol .

## 2024-01-05 NOTE — Assessment & Plan Note (Signed)
 Patient on chronic prednisone .  No acute flare

## 2024-01-05 NOTE — ED Notes (Addendum)
 Charge nurse [facility] notified by CCMD that patient had a 5 beat run of PVC,s, Notified MD Sundil via epic chat. Patient denies any chest pain at this time, SPO2 100% on room air, Respirations even and unlabored. A&Ox4. GCS 15, Skin warm and dry.

## 2024-01-05 NOTE — ED Notes (Addendum)
 Patient started to have a dry non productive cough, and is requesting something to help her sleep. Asked unit secretary to page admitting provider. Spoke with MD Sundil, PRN medications ordered for cough and sleep (SEE MAR)

## 2024-01-05 NOTE — Progress Notes (Signed)
 Patient is complaining about dry cough and something to help with sleep.   Starting Tessalon  as needed and melatonin as needed.  Merly Hinkson, MD [facility] 01/05/2024, 1:15 AM

## 2024-01-05 NOTE — Assessment & Plan Note (Signed)
 Continue blood pressure monitoring.

## 2024-01-05 NOTE — Assessment & Plan Note (Signed)
 Echocardiogram with reduced LV systolic function with EF 20 to 25%, global hypokinesis, mild LV cavity dilatation, RV systolic function with severe reduction, mild enlarged RV cavity, LA and RA with severe dilatation, moderate mitral regurgitation, moderate to severe tricuspid valve regurgitation, mild to moderate aortic regurgitation.  RVSP 34.,8 mmHg.   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  Patient was placed on palliative diuresis in the hospital will continue with oral loop diuretic at home.  Plan to discharge home tomorrow with hospice services.

## 2024-01-05 NOTE — Assessment & Plan Note (Signed)
Symptoms are currently controlled

## 2024-01-05 NOTE — Hospital Course (Addendum)
 Tracy Nelson was admitted to the hospital with the working diagnosis of worsening renal failure and heart failure   78 yo female with the past medical history of hypertension, GERD, CKD stage 5, paroxysmal atrial fibrillation, Sjogren syndrome, who presented with dyspnea and edema. Reported 2 days of progressive generalized edema, despite taking oral diuretic therapy at home.  On her initial physical examination her blood pressure 111/79, HR 68, RR 17 and 02 saturation 100%. Lungs with no wheezing or rhonchi, heart with S1 and S2 present and regular, with no gallops or rubs, positive JVD, respiratory with bilateral rales, abdomen with no distention and positive lower extremity edema.   Na 142, K 5.4 Cl 107 bicarbonate 9 glucose 134 bun 153 cr 10,6  BNP > 4,500 High sensitive troponin 45 and 51  Wbc 8,3 hgb 8,5 plt 189   Chest radiograph with cardiomegaly and bilateral hilar vascular congestion. Small right pleural effusion, positive cardiomegaly.  EKG 71 bpn, left axis deviation, qtc 469, sinus rhythm with 1st degree AV block, no significant ST segment or T wave changes.   Patient was placed on high doses of diuretic therapy. Considering worsening heart failure and cardiorenal syndrome, she was deemed very poor prognosis.  Palliative care was consulted and patient will be transferred home with hospice services.   05/30 patient will be discharge home today and follow with home hospice services.

## 2024-01-06 ENCOUNTER — Inpatient Hospital Stay (HOSPITAL_COMMUNITY)
Admission: RE | Admit: 2024-01-06 | Discharge: 2024-01-06 | Disposition: A | Discharge status: Home / Self Care | Source: Ambulatory Visit | Attending: Nephrology | Admitting: Nephrology

## 2024-01-06 ENCOUNTER — Encounter (HOSPITAL_COMMUNITY): Payer: Self-pay

## 2024-01-06 DIAGNOSIS — I10 Essential (primary) hypertension: Secondary | ICD-10-CM | POA: Diagnosis not present

## 2024-01-06 DIAGNOSIS — N179 Acute kidney failure, unspecified: Secondary | ICD-10-CM | POA: Diagnosis not present

## 2024-01-06 DIAGNOSIS — I480 Paroxysmal atrial fibrillation: Secondary | ICD-10-CM | POA: Diagnosis not present

## 2024-01-06 DIAGNOSIS — I5023 Acute on chronic systolic (congestive) heart failure: Secondary | ICD-10-CM | POA: Diagnosis not present

## 2024-01-06 LAB — RENAL FUNCTION PANEL
Albumin: 3.2 g/dL — ABNORMAL LOW (ref 3.5–5.0)
Anion gap: 19 — ABNORMAL HIGH (ref 5–15)
BUN: 155 mg/dL — ABNORMAL HIGH (ref 8–23)
CO2: 15 mmol/L — ABNORMAL LOW (ref 22–32)
Calcium: 9.4 mg/dL (ref 8.9–10.3)
Chloride: 105 mmol/L (ref 98–111)
Creatinine, Ser: 11.07 mg/dL — ABNORMAL HIGH (ref 0.44–1.00)
GFR, Estimated: 3 mL/min — ABNORMAL LOW (ref >60)
Glucose, Bld: 136 mg/dL — ABNORMAL HIGH (ref 70–99)
Phosphorus: >30 mg/dL — ABNORMAL HIGH (ref 2.5–4.6)
Potassium: 4.5 mmol/L (ref 3.5–5.1)
Sodium: 139 mmol/L (ref 135–145)

## 2024-01-06 MED ORDER — FUROSEMIDE 10 MG/ML IJ SOLN
80.0000 mg | Freq: Two times a day (BID) | INTRAMUSCULAR | Status: DC
  Administered 2024-01-06 – 2024-01-07 (×2): 80 mg via INTRAVENOUS
  Filled 2024-01-06 (×2): qty 8

## 2024-01-06 MED ORDER — MELATONIN 3 MG PO TABS
3.0000 mg | ORAL_TABLET | Freq: Every evening | ORAL | Status: AC | PRN
  Administered 2024-01-07: 3 mg via ORAL
  Filled 2024-01-06: qty 1

## 2024-01-06 NOTE — Progress Notes (Signed)
 Chaplain responded to  consult for Advance Directives. When chaplain arrived, pt was sleeping in her hospital bed, but roused when her name was called. Chaplain offered to return at a later time to allow pt to continue sleeping, but Ms Canal stated that she wanted chaplain to stay and provide education.   Chaplain introduced spiritual care and provided education about living will and health care power of attorney. [name] stated that if she were unable to make her own medical decisions she would want her daughter to make them for her. She also stated that she would want the medical team to do everything they could to save her. [name] fell asleep several times during our visit and chaplain offered to return at a later time when she might feel more alert, but she insisted she was just resting her eyes and encouraged chaplain to continue. Chaplain inquired about whether [name] wanted to complete the ACP packet to formalize her decisions and she stated that she could not do it at this time. Chaplain affirmed pt decision to review the documents when her daughter can be present (particularly given her present state of drowsiness.)  Chaplain spoke with pt RN who shared that it was her understanding that [name] has a goal of seeing her grandson graduate next Friday and is hopeful that the medical team will intervene in any way necessary on her behalf to ensure that she is able to do that.   Tracy Nelson. Davee Lomax, M.Div. [facility] Chaplain Pager [phone] Office [phone]    01/06/24 1124  Spiritual Encounters  Type of Visit Initial  Care provided to: Patient  Referral source Nurse (RN/NT/LPN)  Reason for visit Advance directives  Advance Directives (For Healthcare)  Does Patient Have a Medical Advance Directive? No  Would patient like information on creating a medical advance directive? Yes (Inpatient - patient defers creating a medical advance directive at this time - Information  given)

## 2024-01-06 NOTE — Progress Notes (Signed)
 Progress Note   Patient: Tracy Nelson [id number]:[id number] DOB: Jan 01, 1946 DOA: 01/04/2024     1 DOS: the patient was seen and examined on 01/06/2024   Brief hospital course: Tracy Nelson was admitted to the hospital with the working diagnosis of worsening renal failure   78 yo female with the past medical history of hypertension, GERD, CKD stage 5, paroxysmal atrial fibrillation, Sjogren syndrome, who presented with dyspnea and edema. Reported 2 days of progressive generalized edema, despite taking oral diuretic therapy at home.  On her initial physical examination her blood pressure 111/79, HR 68, RR 17 and 02 saturation 100%. Lungs with no wheezing or rhonchi, heart with S1 and S2 present and regular, with no gallops or rubs, positive JVD, respiratory with bilateral rales, abdomen with no distention and positive lower extremity edema.   Na 142, K 5.4 Cl 107 bicarbonate 9 glucose 134 bun 153 cr 10,6  BNP > 4,500 High sensitive troponin 45 and 51  Wbc 8,3 hgb 8,5 plt 189   Chest radiograph with cardiomegaly and bilateral hilar vascular congestion. Small right pleural effusion, positive cardiomegaly.  EKG 71 bpn, left axis deviation, qtc 469, sinus rhythm with 1st degree AV block, no significant ST segment or T wave changes.   Patient was placed on high doses of diuretic therapy. Considering worsening heart failure and cardiorenal syndrome, she was deemed very poor prognosis.  Palliative care was consulted and patient will be transferred home with hospice services.   Assessment and Plan: * Acute on chronic systolic (congestive) heart failure (HCC) Echocardiogram with reduced LV systolic function with EF 20 to 25%, global hypokinesis, mild LV cavity dilatation, RV systolic function with severe reduction, mild enlarged RV cavity, LA and RA with severe dilatation, moderate mitral regurgitation, moderate to severe tricuspid valve regurgitation, mild to moderate aortic regurgitation.  RVSP 34.,8  mmHg.   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  Palliative diuresis, furosemide  80 mg IV bid  Plan to discharge home tomorrow with hospice services.   Acute kidney injury superimposed on chronic kidney disease (HCC) CKD stage 5, now progressed to ESRD. Anion gap metabolic acidosis.   Continue to worsen renal function with serum cr at 11 with K at 4,5 and serum bicarbonate at 15  Na 139  P > 30  Anion gap 19   Poor prognosis, palliative diuresis as tolerated. Follow up with palliative care and hospice services.   Anemia of chronic renal disease. Cell count has been stable  Metabolic bone disease, on calcitriol .   PAF (paroxysmal atrial fibrillation) (HCC) Patient on amiodarone  for rate control and anticoagulation with apixaban .   HTN (hypertension) Continue blood pressure monitoring   Rheumatoid arthritis involving multiple sites with positive rheumatoid factor (HCC) Patient on chronic prednisone .  No acute flare   GERD (gastroesophageal reflux disease) Continue pantoprazole .   Sjogren's syndrome (HCC) Symptoms are currently controlled.       Subjective: patient is somnolent, opens eyes to voice and responds to simple questions, very weak and deconditioned   Physical Exam: Vitals:   01/05/24 1438 01/05/24 2005 01/06/24 0127 01/06/24 0550  BP:  108/76 104/79 103/76  Pulse:  66 67 65  Resp:  16 19 19   Temp:  (!) 97.5 F (36.4 C) 97.6 F (36.4 C) 97.6 F (36.4 C)  TempSrc:  Oral Oral Oral  SpO2:  99% 99% 97%  Weight:    47.2 kg  Height: 5\' 3"  (1.6 m)      Neurology somnolent and deconditioned, opens eyes to voice and responds to yes and no questions Cardiovascular with S1 and S2 present and regular, positive systolic murmur at the right lower sternal border.  Positive JVD Positive lower extremity edema trace Respiratory with bilateral rales with no  wheezing or rhonchi Abdomen with no distention   Data Reviewed:    Family Communication: no family at the bedside   Disposition: Status is: Inpatient Remains inpatient appropriate because: plan to transfer to home under hospice services   Planned Discharge Destination: Home     Author: Albertus Alt, MD 01/06/2024 2:05 PM  For on call review [email or web address].

## 2024-01-06 NOTE — IPAL (Signed)
 I spoke with patient's daughter about advanced directives, code status. She will talk to patient and let the team know. Patient's daughter is aware of very poor prognosis and very low probability of success in any resuscitation measures in case of a cardio respiratory arrest.

## 2024-01-06 NOTE — TOC Transition Note (Signed)
 Transition of Care [facility]) - Discharge Note   Patient Details  Name: Tracy Nelson MRN: [id number] Date of Birth: 1946/02/20  Transition of Care [facility]) CM/SW Contact:  Benjiman Bras, RN Phone Number: [phone] 01/06/2024, 12:37 PM   Clinical Narrative:     TOC CM spoke to pt and gave permission to speak to dtr, [name]. Spoke to dtr and states she has spoke to [address] [place] rep, [place]. Dtr is requesting pt dc later in day tomorrow due to she is working and will need to transport pt home. Declined PTAR transport. Explained CM can arrange if she decides assistance is needed. HOP will have portable oxygen tank delivered to room prior to dc. Medicare.gov list with rating placed on chart for Home Hospice.   Final next level of care: Home w Hospice Care Barriers to Discharge: No Barriers Identified   Patient Goals and CMS Choice Patient states their goals for this hospitalization and ongoing recovery are:: "I'm soing to see what hospice is recommending, then hopefully go home." CMS Medicare.gov Compare Post Acute Care list provided to:: Patient Represenative (must comment) Choice offered to / list presented to : Adult Children      Discharge Placement                       Discharge Plan and Services Additional resources added to the After Visit Summary for     Discharge Planning Services: CM Consult Post Acute Care Choice: Hospice                    HH Arranged: RN [facility] Agency: [facility] [place] Date HH Agency Contacted: 01/06/24 Time HH Agency Contacted: 1110 Representative spoke with at [facility] Agency: Lyla Samuels  Social Drivers of Health (SDOH) Interventions SDOH Screenings   Food Insecurity: No Food Insecurity (01/05/2024)  Recent Concern: Food Insecurity - Food Insecurity Present (11/16/2023)  Housing: Low Risk  (01/05/2024)  Transportation Needs: No Transportation Needs (01/05/2024)  Recent Concern: Transportation Needs - Unmet Transportation  Needs (11/16/2023)  Utilities: Not At Risk (01/05/2024)  Alcohol  Screen: Low Risk  (06/03/2023)  Depression (PHQ2-9): Low Risk  (11/16/2023)  Financial Resource Strain: Low Risk  (10/20/2023)  Physical Activity: Inactive (06/03/2023)  Social Connections: Moderately Integrated (01/05/2024)  Stress: No Stress Concern Present (06/03/2023)  Tobacco Use: Low Risk  (01/04/2024)  Health Literacy: Adequate Health Literacy (06/03/2023)     Readmission Risk Interventions    08/31/2022   12:35 PM  Readmission Risk Prevention Plan  Transportation Screening Complete  HRI or Home Care Consult Complete  Palliative Care Screening Not Applicable  Medication Review (RN Care Manager) Complete

## 2024-01-07 ENCOUNTER — Other Ambulatory Visit (HOSPITAL_COMMUNITY): Payer: Self-pay

## 2024-01-07 ENCOUNTER — Other Ambulatory Visit: Payer: Self-pay | Admitting: Family Medicine

## 2024-01-07 DIAGNOSIS — R109 Unspecified abdominal pain: Secondary | ICD-10-CM

## 2024-01-07 MED ORDER — PREDNISONE 5 MG PO TABS
5.0000 mg | ORAL_TABLET | Freq: Every day | ORAL | 0 refills | Status: AC
  Filled 2024-01-07: qty 30, 30d supply, fill #0

## 2024-01-07 MED ORDER — METOLAZONE 2.5 MG PO TABS
2.5000 mg | ORAL_TABLET | Freq: Every day | ORAL | Status: DC
  Administered 2024-01-07: 2.5 mg via ORAL
  Filled 2024-01-07: qty 1

## 2024-01-07 MED ORDER — METOLAZONE 2.5 MG PO TABS
2.5000 mg | ORAL_TABLET | Freq: Every day | ORAL | 0 refills | Status: AC
  Filled 2024-01-07: qty 30, 30d supply, fill #0

## 2024-01-07 MED ORDER — TORSEMIDE 100 MG PO TABS
100.0000 mg | ORAL_TABLET | Freq: Two times a day (BID) | ORAL | 0 refills | Status: DC
  Filled 2024-01-07: qty 60, 30d supply, fill #0
  Filled 2024-01-07: qty 300, 30d supply, fill #0

## 2024-01-07 MED ORDER — TORSEMIDE 100 MG PO TABS: 100.0000 mg | ORAL_TABLET | Freq: Two times a day (BID) | ORAL | Status: DC

## 2024-01-07 MED ORDER — GABAPENTIN 100 MG PO CAPS: 100.0000 mg | ORAL_CAPSULE | Freq: Every day | ORAL | Status: DC | PRN

## 2024-01-07 NOTE — Discharge Summary (Signed)
 Physician Discharge Summary   Patient: Tracy Nelson MRN: [id number] DOB: September 11, 1945  Admit date:     01/04/2024  Discharge date: 01/07/24  Discharge Physician: Curlee Doss Kaytlen Lightsey   PCP: Susanna Epley, [name]   Recommendations at discharge:    Patient is being discharged home under hospice services. Palliative diuresis with torsemide  100 mg bid and daily metolazone .  Hospice team will continue to address code status.    Discharge Diagnoses: Principal Problem:   Acute on chronic systolic (congestive) heart failure (HCC) Active Problems:   Acute kidney injury superimposed on chronic kidney disease (HCC)   PAF (paroxysmal atrial fibrillation) (HCC)   HTN (hypertension)   Rheumatoid arthritis involving multiple sites with positive rheumatoid factor (HCC)   GERD (gastroesophageal reflux disease)   Sjogren's syndrome (HCC)  Resolved Problems:   * No resolved hospital problems. [facility] Course: Tracy Nelson was admitted to the hospital with the working diagnosis of worsening renal failure and heart failure   78 yo female with the past medical history of hypertension, GERD, CKD stage 5, paroxysmal atrial fibrillation, Sjogren syndrome, who presented with dyspnea and edema. Reported 2 days of progressive generalized edema, despite taking oral diuretic therapy at home.  On her initial physical examination her blood pressure 111/79, HR 68, RR 17 and 02 saturation 100%. Lungs with no wheezing or rhonchi, heart with S1 and S2 present and regular, with no gallops or rubs, positive JVD, respiratory with bilateral rales, abdomen with no distention and positive lower extremity edema.   Na 142, K 5.4 Cl 107 bicarbonate 9 glucose 134 bun 153 cr 10,6  BNP > 4,500 High sensitive troponin 45 and 51  Wbc 8,3 hgb 8,5 plt 189   Chest radiograph with cardiomegaly and bilateral hilar vascular congestion. Small right pleural effusion, positive cardiomegaly.  EKG 71 bpn, left axis deviation,  qtc 469, sinus rhythm with 1st degree AV block, no significant ST segment or T wave changes.   Patient was placed on high doses of diuretic therapy. Considering worsening heart failure and cardiorenal syndrome, she was deemed very poor prognosis.  Palliative care was consulted and patient will be transferred home with hospice services.   05/30 patient will be discharge home today and follow with home hospice services.   Assessment and Plan: * Acute on chronic systolic (congestive) heart failure (HCC) Echocardiogram with reduced LV systolic function with EF 20 to 25%, global hypokinesis, mild LV cavity dilatation, RV systolic function with severe reduction, mild enlarged RV cavity, LA and RA with severe dilatation, moderate mitral regurgitation, moderate to severe tricuspid valve regurgitation, mild to moderate aortic regurgitation.  RVSP 34.,8 mmHg.   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  Patient was placed on palliative diuresis in the hospital will continue with oral loop diuretic at home.  Plan to discharge home tomorrow with hospice services.   Acute kidney injury superimposed on chronic kidney disease (HCC) CKD stage 5, now progressed to ESRD. Anion gap metabolic acidosis.   Continue to worsen renal function with serum cr at 11 with K at 4,5 and serum bicarbonate at 15  Na 139  P > 30  Anion gap 19   Poor prognosis, palliative diuresis as tolerated. Follow up with palliative care and hospice services.   Anemia of chronic renal disease. Cell count has been stable  Metabolic bone disease, on calcitriol .   PAF (paroxysmal atrial fibrillation) [facility]) Patient  on amiodarone  for rate control and anticoagulation with apixaban .   HTN (hypertension) Continue blood pressure monitoring   Rheumatoid arthritis involving multiple sites with positive rheumatoid factor  (HCC) Patient on chronic prednisone .  No acute flare   GERD (gastroesophageal reflux disease) Continue pantoprazole .   Sjogren's syndrome (HCC) Symptoms are currently controlled.      Consultants: cardiology and palliative care  Procedures performed: none   Disposition: Home Diet recommendation:  Cardiac diet DISCHARGE MEDICATION: Allergies as of 01/07/2024       Reactions   Baclofen Nausea And Vomiting   Penicillins Rash        Medication List     STOP taking these medications    dicyclomine  10 MG capsule Commonly known as: BENTYL    erythromycin ophthalmic ointment   folic acid  1 MG tablet Commonly known as: FOLVITE    Furoscix  80 MG/10ML Ctkt Generic drug: Furosemide    methocarbamol  500 MG tablet Commonly known as: ROBAXIN    multivitamin tablet   potassium chloride  10 MEQ tablet Commonly known as: KLOR-CON  M   sodium bicarbonate  650 MG tablet       TAKE these medications    amiodarone  200 MG tablet Commonly known as: PACERONE  Take 1 tablet (200 mg total) by mouth daily. Take 200 mg by mouth once daily   calcitRIOL  0.25 MCG capsule Commonly known as: ROCALTROL  Take 2 capsules by mouth daily.   diclofenac  Sodium 1 % Gel Commonly known as: VOLTAREN  Apply 2 g topically 4 (four) times daily. What changed:  when to take this reasons to take this   Eliquis  2.5 MG Tabs tablet Generic drug: apixaban  TAKE 1 TABLET BY MOUTH TWICE A DAY   Ensure Take 237 mLs by mouth every other day.   fluorometholone 0.1 % ophthalmic suspension Commonly known as: FML 1 drop 4 (four) times daily.   gabapentin  100 MG capsule Commonly known as: Neurontin  Take 1 capsule (100 mg total) by mouth daily as needed.   hydroxypropyl methylcellulose / hypromellose 2.5 % ophthalmic solution Commonly known as: ISOPTO TEARS / GONIOVISC Place 1 drop into both eyes as needed for dry eyes.   metolazone  2.5 MG tablet Commonly known as: ZAROXOLYN  Take 1 tablet (2.5 mg  total) by mouth daily.   midodrine  2.5 MG tablet Commonly known as: PROAMATINE  Take 1 tablet (2.5 mg total) by mouth 3 (three) times daily with meals.   omeprazole  40 MG capsule Commonly known as: PRILOSEC TAKE 1 CAPSULE (40 MG TOTAL) BY MOUTH DAILY AS NEEDED (FOR ACID REFLUX).   predniSONE  5 MG tablet Commonly known as: DELTASONE  Take 1 tablet (5 mg total) by mouth daily with breakfast. What changed: See the new instructions.   torsemide  20 MG tablet Commonly known as: DEMADEX  Take 5 tablets (100 mg total) by mouth 2 (two) times daily. What changed: how much to take        Follow-up Information     [facility] Follow up.   Why: Hospice RN will contact you to set up appt Contact information: [address] [facility] [place]  [phone]               Discharge Exam: Filed Weights   01/06/24 0550 01/07/24 0510  Weight: 47.2 kg 47.8 kg   BP 93/68 (BP Location: Left Arm)   Pulse 64   Temp 98 F (36.7 C) (Oral)   Resp 18   Ht 5\' 3"  (1.6 m)   Wt 47.8 kg   SpO2 97%  BMI 18.67 kg/m   Patient very weak and deconditioned,   Neurology somnolent but easy to arouse ENT with positive pallor with no icterus Cardiovascular with S1 and S2 present and regular with no gallops or rubs, positive systolic murmur at the right lower sternal border Positive JVD Trace lower extremity edema Respiratory with mild rales at bases with no wheezing, no rhonchi, no increased work of breathing or use of accessory muscles.  Abdomen with no distention   Condition at discharge: stable  The results of significant diagnostics from this hospitalization (including imaging, microbiology, ancillary and laboratory) are listed below for reference.   Imaging Studies: DG Chest 2 View Result Date: 01/04/2024 CLINICAL DATA:  Shortness of breath. EXAM: CHEST - 2 VIEW COMPARISON:  07/09/2023. FINDINGS: Stable cardiomegaly. Mild blunting of the bilateral  costophrenic angles, could reflect pleural thickening or small bilateral pleural effusions with mild bibasilar atelectasis. No overt edema, focal consolidation or pneumothorax. Dextrocurvature of the thoracic spine. No acute osseous abnormality. IMPRESSION: 1. Mild blunting of the bilateral costophrenic angles, could reflect pleural thickening or small bilateral pleural effusions, with mild bibasilar atelectasis. 2. Stable cardiomegaly. Electronically Signed   By: Mannie Seek M.D.   On: 01/04/2024 15:23    Microbiology: Results for orders placed or performed during the hospital encounter of 08/30/22  Resp panel by RT-PCR (RSV, Flu A&B, Covid) Anterior Nasal Swab     Status: None   Collection Time: 08/30/22  3:34 PM   Specimen: Anterior Nasal Swab  Result Value Ref Range Status   SARS Coronavirus 2 by RT PCR NEGATIVE NEGATIVE Final    Comment: (NOTE) SARS-CoV-2 target nucleic acids are NOT DETECTED.  The SARS-CoV-2 RNA is generally detectable in upper respiratory specimens during the acute phase of infection. The lowest concentration of SARS-CoV-2 viral copies this assay can detect is 138 copies/mL. A negative result does not preclude SARS-Cov-2 infection and should not be used as the sole basis for treatment or other patient management decisions. A negative result may occur with  improper specimen collection/handling, submission of specimen other than nasopharyngeal swab, presence of viral mutation(s) within the areas targeted by this assay, and inadequate number of viral copies(<138 copies/mL). A negative result must be combined with clinical observations, patient history, and epidemiological information. The expected result is Negative.  Fact Sheet for Patients:  BloggerCourse.com  Fact Sheet for Healthcare Providers:  SeriousBroker.it  This test is no t yet approved or cleared by the [place]  FDA and  has been authorized  for detection and/or diagnosis of SARS-CoV-2 by FDA under an Emergency Use Authorization (EUA). This EUA will remain  in effect (meaning this test can be used) for the duration of the COVID-19 declaration under Section 564(b)(1) of the Act, 21 U.S.C.section 360bbb-3(b)(1), unless the authorization is terminated  or revoked sooner.       Influenza A by PCR NEGATIVE NEGATIVE Final   Influenza B by PCR NEGATIVE NEGATIVE Final    Comment: (NOTE) The Xpert Xpress SARS-CoV-2/FLU/RSV plus assay is intended as an aid in the diagnosis of influenza from Nasopharyngeal swab specimens and should not be used as a sole basis for treatment. Nasal washings and aspirates are unacceptable for Xpert Xpress SARS-CoV-2/FLU/RSV testing.  Fact Sheet for Patients: BloggerCourse.com  Fact Sheet for Healthcare Providers: SeriousBroker.it  This test is not yet approved or cleared by the [place]  FDA and has been authorized for detection and/or diagnosis of SARS-CoV-2 by FDA under an Emergency Use Authorization (EUA). This EUA will  remain in effect (meaning this test can be used) for the duration of the COVID-19 declaration under Section 564(b)(1) of the Act, 21 U.S.C. section 360bbb-3(b)(1), unless the authorization is terminated or revoked.     Resp Syncytial Virus by PCR NEGATIVE NEGATIVE Final    Comment: (NOTE) Fact Sheet for Patients: BloggerCourse.com  Fact Sheet for Healthcare Providers: SeriousBroker.it  This test is not yet approved or cleared by the [place]  FDA and has been authorized for detection and/or diagnosis of SARS-CoV-2 by FDA under an Emergency Use Authorization (EUA). This EUA will remain in effect (meaning this test can be used) for the duration of the COVID-19 declaration under Section 564(b)(1) of the Act, 21 U.S.C. section 360bbb-3(b)(1), unless the authorization is  terminated or revoked.  Performed at [facility], [address] [place], [place] [address]   Group A Strep by PCR     Status: None   Collection Time: 08/31/22  5:12 PM   Specimen: Throat; Sterile Swab  Result Value Ref Range Status   Group A Strep by PCR NOT DETECTED NOT DETECTED Final    Comment: Performed at [facility], [address] [place], [place] [address]    Labs: CBC: Recent Labs  Lab 01/04/24 1223 01/05/24 0448  WBC 8.3 9.4  NEUTROABS 6.7  --   HGB 8.5* 8.3*  HCT 26.7* 26.8*  MCV 93.7 94.0  PLT 189 175   Basic Metabolic Panel: Recent Labs  Lab 01/04/24 1205 01/04/24 2200 01/05/24 0448 01/06/24 0313  NA 142 138 139 139  K 5.4* 4.8 4.6 4.5  CL 107 106 109 105  CO2 9* 13* 14* 15*  GLUCOSE 134* 197* 100* 136*  BUN 153* 153* 156* 155*  CREATININE 10.67* 10.61* 10.86* 11.07*  CALCIUM 9.7 9.5 9.2 9.4  PHOS  --   --   --  >30.0*   Liver Function Tests: Recent Labs  Lab 01/04/24 1205 01/05/24 0448 01/06/24 0313  AST 17 16  --   ALT 28 23  --   ALKPHOS 66 61  --   BILITOT 0.7 0.8  --   PROT 6.5 6.1*  --   ALBUMIN  3.4* 3.2* 3.2*   CBG: No results for input(s): "GLUCAP" in the last 168 hours.  Discharge time spent: greater than 30 minutes.  Signed: Albertus Alt, MD [facility] 01/07/2024

## 2024-01-07 NOTE — TOC Transition Note (Signed)
 Transition of Care [facility]) - Discharge Note   Patient Details  Name: Tracy Nelson MRN: [id number] Date of Birth: [birth year]  [facility]) CM/SW Contact:  Jonathan Neighbor, RN Phone Number: 01/07/2024, 12:24 PM   Clinical Narrative:     Pt is discharging home with [facility]. Cheri with H[facility] aware of dc home today.  Oxygen for transportation home is at the bedside. Concentrator set up at home.  Daughter will provide transportation home.  Final next level of care: Home w Hospice Care Barriers to Discharge: No Barriers Identified   Patient Goals and CMS Choice Patient states their goals for this hospitalization and ongoing recovery are:: "I'm soing to see what hospice is recommending, then hopefully go home." CMS Medicare.gov Compare Post Acute Care list provided to:: Patient Represenative (must comment) Choice offered to / list presented to : Adult Children      Discharge Placement                       Discharge Plan and Services Additional resources added to the After Visit Summary for     Discharge Planning Services: CM Consult Post Acute Care Choice: Hospice          DME Arranged: Oxygen   Date DME Agency Contacted: 01/07/24   Representative spoke with at DME Agency: [facility] arranged HH Arranged: RN [facility] Agency: [facility] [place] Date HH Agency Contacted: 01/06/24 Time HH Agency Contacted: 1110 Representative spoke with at [facility] Agency: Lyla Samuels  Social Drivers of Health (SDOH) Interventions SDOH Screenings   Food Insecurity: No Food Insecurity (01/05/2024)  Recent Concern: Food Insecurity - Food Insecurity Present (11/16/2023)  Housing: Low Risk  (01/05/2024)  Transportation Needs: No Transportation Needs (01/05/2024)  Recent Concern: Transportation Needs - Unmet Transportation Needs (11/16/2023)  Utilities: Not At Risk (01/05/2024)  Alcohol  Screen: Low Risk  (06/03/2023)  Depression (PHQ2-9): Low Risk  (11/16/2023)   Financial Resource Strain: Low Risk  (10/20/2023)  Physical Activity: Inactive (06/03/2023)  Social Connections: Moderately Integrated (01/05/2024)  Stress: No Stress Concern Present (06/03/2023)  Tobacco Use: Low Risk  (01/04/2024)  Health Literacy: Adequate Health Literacy (06/03/2023)     Readmission Risk Interventions    08/31/2022   12:35 PM  Readmission Risk Prevention Plan  Transportation Screening Complete  HRI or Home Care Consult Complete  Palliative Care Screening Not Applicable  Medication Review (RN Care Manager) Complete

## 2024-01-07 NOTE — Care Management Important Message (Signed)
 Important Message  Patient Details  Name: Tracy Nelson MRN: [id number] Date of Birth: 1946/04/07   Important Message Given:  Yes - Medicare IM     Janith Melnick 01/07/2024, 10:33 AM

## 2024-01-11 ENCOUNTER — Encounter (HOSPITAL_COMMUNITY): Payer: Self-pay | Admitting: Adult Health

## 2024-01-11 NOTE — Progress Notes (Signed)
  HF [facility] Discharge  Discussed with HF Paramedic and HF Team.   She has transitioned to Hospice. Will d/c from HF Paramedicine.   Marlowe Lawes NP-C  8:49 AM

## 2024-01-18 ENCOUNTER — Other Ambulatory Visit: Payer: Self-pay

## 2024-01-18 NOTE — Patient Outreach (Signed)
 Placed outbound call to [facility], confirmed patient has been admitted to this facility for Hospice care.  No further follow up required: patient closed from complex case management due to being under the care of Hospice.  Louanne Roussel RN BSN CCM [place]  [facility], [facility] Nurse Care Coordinator  Direct Dial: [phone] Website: [email or web address]

## 2024-01-20 ENCOUNTER — Encounter (HOSPITAL_COMMUNITY)

## 2024-01-27 ENCOUNTER — Telehealth: Payer: Self-pay | Admitting: Nurse Practitioner

## 2024-01-27 NOTE — Telephone Encounter (Signed)
 Called daughter [name] to express condolences on the loss of her mother. Patient passed away on 06-Feb-2024 at 9:42am. The funeral is set for tomorrow.

## 2024-02-08 DEATH — deceased

## 2024-02-09 ENCOUNTER — Encounter (HOSPITAL_COMMUNITY): Payer: Self-pay

## 2024-02-16 ENCOUNTER — Ambulatory Visit (INDEPENDENT_AMBULATORY_CARE_PROVIDER_SITE_OTHER): Payer: Self-pay | Admitting: Podiatry

## 2024-02-16 DIAGNOSIS — Z91199 Patient's noncompliance with other medical treatment and regimen due to unspecified reason: Secondary | ICD-10-CM

## 2024-02-16 NOTE — Progress Notes (Signed)
 1. No-show for appointment    Recent hospitalization and discharged home to hospice.

## 2024-02-18 ENCOUNTER — Telehealth: Payer: Self-pay | Admitting: Pharmacist

## 2024-02-18 DIAGNOSIS — I480 Paroxysmal atrial fibrillation: Secondary | ICD-10-CM

## 2024-02-18 NOTE — Progress Notes (Addendum)
   02/18/2024  [name], female   DOB: 12-07-1945, 78 y.o.   MRN: [id number]  Patient was called regarding medication assistance with Eliquis .  Unfortunately, she did not answer her phone. HIPAA compliant message was left on her voicemail.  Plan: Call Patient back in 5-7 business days.   ADDENDUM 03/09/24  Eliquis  was discontinued by Dr. Ezra on 02/28/24   [name], PharmD, [facility] Clinical Pharmacist [phone]

## 2024-04-04 ENCOUNTER — Ambulatory Visit: Payer: Medicare PPO | Admitting: Internal Medicine

## 2024-05-17 ENCOUNTER — Ambulatory Visit: Admitting: Podiatry

## 2024-06-05 ENCOUNTER — Ambulatory Visit: Admitting: Nurse Practitioner
